# Patient Record
Sex: Female | Born: 1960 | Race: Black or African American | Hispanic: No | Marital: Single | Smoking: Former smoker
Health system: Southern US, Community
[De-identification: ages and names within clinical notes are randomized; demographics above are authoritative.]

## PROBLEM LIST (undated history)

## (undated) DIAGNOSIS — I10 Essential (primary) hypertension: Secondary | ICD-10-CM

## (undated) DIAGNOSIS — M1990 Unspecified osteoarthritis, unspecified site: Secondary | ICD-10-CM

## (undated) DIAGNOSIS — I214 Non-ST elevation (NSTEMI) myocardial infarction: Secondary | ICD-10-CM

## (undated) DIAGNOSIS — F32A Depression, unspecified: Secondary | ICD-10-CM

## (undated) DIAGNOSIS — I2510 Atherosclerotic heart disease of native coronary artery without angina pectoris: Secondary | ICD-10-CM

## (undated) DIAGNOSIS — F329 Major depressive disorder, single episode, unspecified: Secondary | ICD-10-CM

## (undated) HISTORY — PX: ANKLE SURGERY: SHX546

---

## 2004-11-27 ENCOUNTER — Emergency Department (HOSPITAL_COMMUNITY): Admission: EM | Admit: 2004-11-27 | Discharge: 2004-11-27 | Payer: Self-pay | Admitting: Emergency Medicine

## 2004-12-05 ENCOUNTER — Ambulatory Visit (HOSPITAL_BASED_OUTPATIENT_CLINIC_OR_DEPARTMENT_OTHER): Admission: RE | Admit: 2004-12-05 | Discharge: 2004-12-05 | Payer: Self-pay | Admitting: Orthopedic Surgery

## 2004-12-05 ENCOUNTER — Ambulatory Visit (HOSPITAL_COMMUNITY): Admission: RE | Admit: 2004-12-05 | Discharge: 2004-12-05 | Payer: Self-pay | Admitting: Orthopedic Surgery

## 2004-12-06 ENCOUNTER — Ambulatory Visit: Payer: Self-pay | Admitting: *Deleted

## 2004-12-06 ENCOUNTER — Ambulatory Visit: Payer: Self-pay | Admitting: Internal Medicine

## 2005-01-16 ENCOUNTER — Ambulatory Visit: Payer: Self-pay | Admitting: Internal Medicine

## 2005-02-24 ENCOUNTER — Ambulatory Visit: Payer: Self-pay | Admitting: Internal Medicine

## 2005-03-14 ENCOUNTER — Encounter (INDEPENDENT_AMBULATORY_CARE_PROVIDER_SITE_OTHER): Payer: Self-pay | Admitting: Family Medicine

## 2005-04-10 ENCOUNTER — Ambulatory Visit: Payer: Self-pay | Admitting: Internal Medicine

## 2006-08-29 ENCOUNTER — Ambulatory Visit: Payer: Self-pay | Admitting: Internal Medicine

## 2006-09-24 ENCOUNTER — Ambulatory Visit: Payer: Self-pay | Admitting: Internal Medicine

## 2007-03-12 ENCOUNTER — Ambulatory Visit: Payer: Self-pay | Admitting: Family Medicine

## 2007-05-01 ENCOUNTER — Encounter (INDEPENDENT_AMBULATORY_CARE_PROVIDER_SITE_OTHER): Payer: Self-pay | Admitting: *Deleted

## 2007-05-01 ENCOUNTER — Encounter (INDEPENDENT_AMBULATORY_CARE_PROVIDER_SITE_OTHER): Payer: Self-pay | Admitting: Family Medicine

## 2007-05-01 DIAGNOSIS — I10 Essential (primary) hypertension: Secondary | ICD-10-CM | POA: Insufficient documentation

## 2008-01-24 ENCOUNTER — Telehealth (INDEPENDENT_AMBULATORY_CARE_PROVIDER_SITE_OTHER): Payer: Self-pay | Admitting: Family Medicine

## 2008-01-28 ENCOUNTER — Telehealth (INDEPENDENT_AMBULATORY_CARE_PROVIDER_SITE_OTHER): Payer: Self-pay | Admitting: Family Medicine

## 2008-02-19 ENCOUNTER — Ambulatory Visit: Payer: Self-pay | Admitting: Family Medicine

## 2008-02-25 LAB — CONVERTED CEMR LAB
ALT: 16 units/L (ref 0–35)
Albumin: 3.6 g/dL (ref 3.5–5.2)
BUN: 16 mg/dL (ref 6–23)
CO2: 25 meq/L (ref 19–32)
Chloride: 106 meq/L (ref 96–112)
Eosinophils Absolute: 0.2 10*3/uL (ref 0.0–0.7)
Eosinophils Relative: 3 % (ref 0–5)
Glucose, Bld: 134 mg/dL — ABNORMAL HIGH (ref 70–99)
MCHC: 32.1 g/dL (ref 30.0–36.0)
MCV: 88.3 fL (ref 78.0–100.0)
Neutro Abs: 3.8 10*3/uL (ref 1.7–7.7)
Neutrophils Relative %: 58 % (ref 43–77)
Platelets: 304 10*3/uL (ref 150–400)
Potassium: 3.9 meq/L (ref 3.5–5.3)
RDW: 16.2 % — ABNORMAL HIGH (ref 11.5–15.5)
Sodium: 142 meq/L (ref 135–145)
TSH: 0.999 microintl units/mL (ref 0.350–4.50)
WBC: 6.6 10*3/uL (ref 4.0–10.5)

## 2008-02-26 ENCOUNTER — Ambulatory Visit: Payer: Self-pay | Admitting: Nurse Practitioner

## 2008-02-26 LAB — CONVERTED CEMR LAB: Blood Glucose, AC Bkfst: 100 mg/dL

## 2008-03-31 ENCOUNTER — Ambulatory Visit: Payer: Self-pay | Admitting: Family Medicine

## 2008-03-31 ENCOUNTER — Encounter (INDEPENDENT_AMBULATORY_CARE_PROVIDER_SITE_OTHER): Payer: Self-pay | Admitting: Internal Medicine

## 2008-04-07 ENCOUNTER — Ambulatory Visit: Payer: Self-pay | Admitting: Internal Medicine

## 2008-04-21 ENCOUNTER — Encounter (INDEPENDENT_AMBULATORY_CARE_PROVIDER_SITE_OTHER): Payer: Self-pay | Admitting: Internal Medicine

## 2008-04-21 ENCOUNTER — Ambulatory Visit: Payer: Self-pay | Admitting: Internal Medicine

## 2008-04-21 DIAGNOSIS — K029 Dental caries, unspecified: Secondary | ICD-10-CM | POA: Insufficient documentation

## 2008-04-21 LAB — CONVERTED CEMR LAB
ALT: 15 units/L (ref 0–35)
AST: 14 units/L (ref 0–37)
Alkaline Phosphatase: 48 units/L (ref 39–117)
BUN: 11 mg/dL (ref 6–23)
Basophils Relative: 0 % (ref 0–1)
CO2: 27 meq/L (ref 19–32)
Calcium: 8.6 mg/dL (ref 8.4–10.5)
Chlamydia, DNA Probe: NEGATIVE
Chloride: 108 meq/L (ref 96–112)
Creatinine, Ser: 0.98 mg/dL (ref 0.40–1.20)
Eosinophils Absolute: 0.2 10*3/uL (ref 0.0–0.7)
LDL Cholesterol: 111 mg/dL — ABNORMAL HIGH (ref 0–99)
Lymphs Abs: 2.5 10*3/uL (ref 0.7–4.0)
Monocytes Absolute: 0.6 10*3/uL (ref 0.1–1.0)
Neutro Abs: 2.9 10*3/uL (ref 1.7–7.7)
Neutrophils Relative %: 47 % (ref 43–77)
Platelets: 346 10*3/uL (ref 150–400)
RBC: 4.87 M/uL (ref 3.87–5.11)
Triglycerides: 82 mg/dL (ref <150)

## 2008-04-29 ENCOUNTER — Ambulatory Visit (HOSPITAL_COMMUNITY): Admission: RE | Admit: 2008-04-29 | Discharge: 2008-04-29 | Payer: Self-pay | Admitting: Family Medicine

## 2008-04-29 ENCOUNTER — Encounter (INDEPENDENT_AMBULATORY_CARE_PROVIDER_SITE_OTHER): Payer: Self-pay | Admitting: Family Medicine

## 2008-05-06 ENCOUNTER — Encounter (INDEPENDENT_AMBULATORY_CARE_PROVIDER_SITE_OTHER): Payer: Self-pay | Admitting: Internal Medicine

## 2008-08-27 ENCOUNTER — Telehealth (INDEPENDENT_AMBULATORY_CARE_PROVIDER_SITE_OTHER): Payer: Self-pay | Admitting: Internal Medicine

## 2008-09-25 ENCOUNTER — Ambulatory Visit: Payer: Self-pay | Admitting: Internal Medicine

## 2008-09-25 LAB — CONVERTED CEMR LAB: Blood Glucose, Fingerstick: 110

## 2009-02-12 ENCOUNTER — Ambulatory Visit: Payer: Self-pay | Admitting: Internal Medicine

## 2009-02-12 DIAGNOSIS — E119 Type 2 diabetes mellitus without complications: Secondary | ICD-10-CM

## 2009-02-12 DIAGNOSIS — M79609 Pain in unspecified limb: Secondary | ICD-10-CM

## 2009-02-12 LAB — CONVERTED CEMR LAB
Blood Glucose, Fingerstick: 158
CO2: 25 meq/L (ref 19–32)
Glucose, Bld: 107 mg/dL — ABNORMAL HIGH (ref 70–99)
Hgb A1c MFr Bld: 6.6 %

## 2009-05-19 ENCOUNTER — Ambulatory Visit: Payer: Self-pay | Admitting: Internal Medicine

## 2009-05-19 ENCOUNTER — Ambulatory Visit (HOSPITAL_COMMUNITY): Admission: RE | Admit: 2009-05-19 | Discharge: 2009-05-19 | Payer: Self-pay | Admitting: Internal Medicine

## 2009-05-19 DIAGNOSIS — R930 Abnormal findings on diagnostic imaging of skull and head, not elsewhere classified: Secondary | ICD-10-CM

## 2009-05-19 LAB — CONVERTED CEMR LAB
Blood Glucose, Fingerstick: 179
Hgb A1c MFr Bld: 6.3 %

## 2009-09-21 ENCOUNTER — Ambulatory Visit: Payer: Self-pay | Admitting: Internal Medicine

## 2009-09-21 DIAGNOSIS — F17200 Nicotine dependence, unspecified, uncomplicated: Secondary | ICD-10-CM | POA: Insufficient documentation

## 2009-09-21 DIAGNOSIS — Z8719 Personal history of other diseases of the digestive system: Secondary | ICD-10-CM | POA: Insufficient documentation

## 2009-09-21 LAB — CONVERTED CEMR LAB
Bilirubin Urine: NEGATIVE
Blood Glucose, Fingerstick: 163
Blood in Urine, dipstick: NEGATIVE
Chlamydia, DNA Probe: NEGATIVE
GC Probe Amp, Genital: NEGATIVE
Glucose, Urine, Semiquant: NEGATIVE
Protein, U semiquant: NEGATIVE
Specific Gravity, Urine: 1.015
WBC Urine, dipstick: NEGATIVE
Whiff Test: NEGATIVE

## 2009-09-23 ENCOUNTER — Telehealth (INDEPENDENT_AMBULATORY_CARE_PROVIDER_SITE_OTHER): Payer: Self-pay | Admitting: Internal Medicine

## 2009-09-29 ENCOUNTER — Ambulatory Visit (HOSPITAL_COMMUNITY): Admission: RE | Admit: 2009-09-29 | Discharge: 2009-09-29 | Payer: Self-pay | Admitting: Internal Medicine

## 2009-10-05 ENCOUNTER — Ambulatory Visit: Payer: Self-pay | Admitting: Internal Medicine

## 2009-10-05 DIAGNOSIS — K921 Melena: Secondary | ICD-10-CM | POA: Insufficient documentation

## 2009-10-05 LAB — CONVERTED CEMR LAB
OCCULT 1: POSITIVE
OCCULT 2: POSITIVE
OCCULT 3: NEGATIVE
Rapid HIV Screen: NEGATIVE

## 2009-10-16 ENCOUNTER — Encounter (INDEPENDENT_AMBULATORY_CARE_PROVIDER_SITE_OTHER): Payer: Self-pay | Admitting: Internal Medicine

## 2009-10-16 LAB — CONVERTED CEMR LAB
ALT: 11 units/L (ref 0–35)
AST: 12 units/L (ref 0–37)
Alkaline Phosphatase: 38 units/L — ABNORMAL LOW (ref 39–117)
Basophils Absolute: 0 10*3/uL (ref 0.0–0.1)
CO2: 26 meq/L (ref 19–32)
Calcium: 8.4 mg/dL (ref 8.4–10.5)
Chloride: 107 meq/L (ref 96–112)
Glucose, Bld: 94 mg/dL (ref 70–99)
HDL: 44 mg/dL (ref >39)
LDL Cholesterol: 83 mg/dL (ref 0–99)
Lymphocytes Relative: 45 % (ref 12–46)
Lymphs Abs: 2.5 10*3/uL (ref 0.7–4.0)
Microalb, Ur: 0.5 mg/dL (ref 0.00–1.89)
Monocytes Relative: 8 % (ref 3–12)
Neutro Abs: 2.5 10*3/uL (ref 1.7–7.7)
Neutrophils Relative %: 45 % (ref 43–77)
Platelets: 287 10*3/uL (ref 150–400)
RBC: 4.84 M/uL (ref 3.87–5.11)
Sodium: 142 meq/L (ref 135–145)
Total Bilirubin: 0.4 mg/dL (ref 0.3–1.2)
Total CHOL/HDL Ratio: 3.3
VLDL: 16 mg/dL (ref 0–40)

## 2009-11-03 ENCOUNTER — Ambulatory Visit: Payer: Self-pay | Admitting: Internal Medicine

## 2009-11-03 LAB — CONVERTED CEMR LAB
BUN: 19 mg/dL (ref 6–23)
Calcium: 8.8 mg/dL (ref 8.4–10.5)
Glucose, Bld: 237 mg/dL — ABNORMAL HIGH (ref 70–99)
Potassium: 3.9 meq/L (ref 3.5–5.3)

## 2009-11-25 ENCOUNTER — Encounter (INDEPENDENT_AMBULATORY_CARE_PROVIDER_SITE_OTHER): Payer: Self-pay | Admitting: Internal Medicine

## 2010-01-21 ENCOUNTER — Telehealth (INDEPENDENT_AMBULATORY_CARE_PROVIDER_SITE_OTHER): Payer: Self-pay | Admitting: Internal Medicine

## 2010-01-31 ENCOUNTER — Ambulatory Visit: Payer: Self-pay | Admitting: Internal Medicine

## 2010-01-31 DIAGNOSIS — L723 Sebaceous cyst: Secondary | ICD-10-CM

## 2010-01-31 LAB — CONVERTED CEMR LAB
Blood Glucose, Fingerstick: 191
Eosinophils Absolute: 0.2 10*3/uL (ref 0.0–0.7)
Eosinophils Relative: 3 % (ref 0–5)
Hgb A1c MFr Bld: 6.5 %
Lymphs Abs: 2.1 10*3/uL (ref 0.7–4.0)
Monocytes Absolute: 0.6 10*3/uL (ref 0.1–1.0)
Monocytes Relative: 10 % (ref 3–12)
Neutro Abs: 2.7 10*3/uL (ref 1.7–7.7)
Neutrophils Relative %: 49 % (ref 43–77)

## 2010-02-02 ENCOUNTER — Encounter (INDEPENDENT_AMBULATORY_CARE_PROVIDER_SITE_OTHER): Payer: Self-pay | Admitting: Internal Medicine

## 2010-02-10 ENCOUNTER — Ambulatory Visit: Payer: Self-pay | Admitting: Internal Medicine

## 2010-09-15 NOTE — Progress Notes (Signed)
   Phone Note Outgoing Call   Summary of Call: Rec'd refill request for Metformin, Tiazac and HCTZ. Meds last filled 11/30/2009. Patient sees PCP later this month. Refill x 1 month and f/u with PCP as scheduled. Initial call taken by: Tereso Newcomer PA-C,  January 21, 2010 3:59 PM  Follow-up for Phone Call        Pt scheduled for 02/03/10. Follow-up by: Vesta Mixer CMA,  January 24, 2010 8:48 AM    Prescriptions: METFORMIN HCL 500 MG TABS (METFORMIN HCL) 1 tab by mouth daily with largest meal.  #30 x 0   Entered and Authorized by:   Tereso Newcomer PA-C   Signed by:   Tereso Newcomer PA-C on 01/21/2010   Method used:   Faxed to ...       [facility] - Pharmac (retail)       [address] [place], [place]  [address]       Ph: [phone]       Fax: [phone]   RxID:   [id number] TIAZAC 360 MG  CP24 (DILTIAZEM HCL ER BEADS) 1 tab by mouth daily  #30 x 0   Entered and Authorized by:   Tereso Newcomer PA-C   Signed by:   Tereso Newcomer PA-C on 01/21/2010   Method used:   Faxed to ...       [facility] - Pharmac (retail)       [address] [place], [place]  [address]       Ph: [phone]       Fax: [phone]   RxID:   [id number] HYDROCHLOROTHIAZIDE 25 MG  TABS (HYDROCHLOROTHIAZIDE) 1 tab by mouth daily  #30 x 0   Entered and Authorized by:   Tereso Newcomer PA-C   Signed by:   Tereso Newcomer PA-C on 01/21/2010   Method used:   Faxed to ...       [facility] - Pharmac (retail)       [address] [place], [place]  [address]       Ph: [phone]       Fax: [phone]   RxID:   [id number]

## 2010-09-15 NOTE — Assessment & Plan Note (Signed)
Summary: CPP EXAM//GK   Vital Signs:  Patient profile:   50 year old female LMP:     07/05/2009 Weight:      192 pounds Temp:     98.3 degrees F Pulse rate:   71 / minute Pulse rhythm:   regular Resp:     18 per minute BP sitting:   130 / 88  (left arm) Cuff size:   large  Vitals Entered By: Vesta Mixer CMA (September 21, 2009 11:04 AM) Is Patient Diabetic? Yes Pain Assessment Patient in pain? no      CBG Result 163 LMP (date): 07/05/2009     Enter LMP: 07/05/2009 Last PAP Result NEGATIVE FOR INTRAEPITHELIAL LESIONS OR MALIGNANCY.   History of Present Illness: 50 yo female her for CPP:  1.  Hx of abnormal lung exam:  Pt. is a smoker x 20 years.  CXR in 05/2009 showed atelectasis of RLL vs fibrosis.  Pt. denies any hx of pneumonia that she is aware of.  Pt. does inhale chemicals with job cleaning.  Has never really tried to quit smoking--has only been without for 7 days.  2.  BRBPR:  passed a hard stool 2 weeks ago.  Had bright red blood on tissue and in toilet water, but not noted mixed in stool itself.  Has not recurred.   Has had similar occurence with hard stools in past.  Rare, however.    Habits & Providers  Alcohol-Tobacco-Diet     Alcohol drinks/day: <1     Tobacco Status: current     Cigarette Packs/Day: 1/3 ppd     Year Started: age 89  Exercise-Depression-Behavior     Drug Use: never  Allergies: No Known Drug Allergies  Past History:  Past Medical History: HEEL PAIN, RIGHT (ICD-729.5) RIGHT LOWER LUNG FIELD CRACKLES (ICD-793.1) FOOT PAIN, RIGHT (ICD-729.5) DIABETES MELLITUS, TYPE II (ICD-250.00) DENTAL CARIES (ICD-521.00) ENCOUNTER FOR LONG-TERM USE OF OTHER MEDICATIONS (ICD-V58.69) ROUTINE GYNECOLOGICAL EXAMINATION (ICD-V72.31) HEALTH MAINTENANCE EXAM (ICD-V70.0) HYPERTENSION (ICD-401.9)  Past Surgical History: Reviewed history from 02/19/2008 and no changes required. Denies surgical history  Family History: Mothe, 68:HTN,Heart  disease,fibromyalgia,DM,thyroid problems. Father, 3:  MS,DM Sister, 98:  DM No children  Social History: Packs/Day:  1/3 ppd Drug Use:  never  Review of Systems General:  Energy fine. Eyes:  Bifocals.  Last eye check 2 years ago.. ENT:  Denies decreased hearing. CV:  Denies chest pain or discomfort and palpitations. Resp:  Complains of shortness of breath; For years--only if really exerts herself--no change for years.[name] GI:  Complains of constipation; denies abdominal pain, bloody stools, dark tarry stools, and diarrhea; See HPI--BRBPR. GU:  Denies discharge, dysuria, and urinary frequency; no period for 2 months.  Not sexually active.  Some hot flashes and night sweats.  . MS:  Denies joint pain, joint redness, and joint swelling; Right shoulder with chronic discomfort, however--old injury.  Naproxen helps.. Derm:  Denies lesion(s) and rash. Neuro:  Denies numbness, tingling, and weakness. Psych:  Denies anxiety, depression, and suicidal thoughts/plans.  Physical Exam  General:  Well-developed,well-nourished,in no acute distress; alert,appropriate and cooperative throughout examination Head:  Normocephalic and atraumatic without obvious abnormalities. No apparent alopecia or balding. Eyes:  No corneal or conjunctival inflammation noted. EOMI. Perrla. Funduscopic exam benign, without hemorrhages, exudates or papilledema. Vision grossly normal. Ears:  External ear exam shows no significant lesions or deformities.  Otoscopic examination reveals clear canals, tympanic membranes are intact bilaterally without bulging, retraction, inflammation or discharge. Hearing is grossly normal  bilaterally. Nose:  External nasal examination shows no deformity or inflammation. Nasal mucosa are pink and moist without lesions or exudates. Mouth:  Oral mucosa and oropharynx without lesions or exudates.  fair dentition.   Neck:  No deformities, masses, or tenderness noted. Breasts:  No mass, nodules,  thickening, tenderness, bulging, retraction, inflamation, nipple discharge or skin changes noted.   Lungs:  Normal respiratory effort, chest expands symmetrically. Lungs are clear to auscultation, no crackles or wheezes. Heart:  Normal rate and regular rhythm. S1 and S2 normal without gallop, murmur, click, rub or other extra sounds. Abdomen:  Bowel sounds positive,abdomen soft and non-tender without masses, organomegaly or hernias noted. Rectal:  Small external tags noted. Normal sphincter tone. No rectal masses or tenderness. Genitalia:  Pelvic Exam:        External: normal female genitalia without lesions or masses        Vagina: normal without lesions or masses        Cervix: normal without lesions or masses        Adnexa: normal bimanual exam without masses or fullness        Uterus: normal by palpation        Pap smear: performed Msk:  No deformity or scoliosis noted of thoracic or lumbar spine.   Pulses:  R and L carotid,radial,femoral,dorsalis pedis and posterior tibial pulses are full and equal bilaterally Extremities:  No clubbing, cyanosis, edema, or deformity noted with normal full range of motion of all joints.   Neurologic:  No cranial nerve deficits noted. Station and gait are normal. Plantar reflexes are down-going bilaterally. DTRs are symmetrical throughout. Sensory, motor and coordinative functions appear intact. Skin:  hyperpigmented nonpalpable scar, right posterior shoulder Cervical Nodes:  No lymphadenopathy noted Axillary Nodes:  No palpable lymphadenopathy Inguinal Nodes:  No significant adenopathy Psych:  Cognition and judgment appear intact. Alert and cooperative with normal attention span and concentration. No apparent delusions, illusions, hallucinations   Impression & Recommendations:  Problem # 1:  ROUTINE GYNECOLOGICAL EXAMINATION (ICD-V72.31) Mammogram scheduled Orders: KOH/ WET Mount [phone]) Pap Smear, Thin Prep ( Collection of) (U0454) UA Dipstick w/o  Micro (automated)  (81003) T- GC Chlamydia (09811) T-Pap Smear, Thin Prep (91478)  Problem # 2:  TOBACCO ABUSE (ICD-305.1) Pt. to contemplate Chantix  Problem # 3:  RIGHT LOWER LUNG FIELD CRACKLES (ICD-793.1) No findings on exam today  Problem # 4:  RECTAL BLEEDING, HX OF (ICD-V12.79) Suspect secondary to hemorrhoids or possible superficial injury with passage of hard stools--to get stool card packet in.  Complete Medication List: 1)  Hydrochlorothiazide 25 Mg Tabs (Hydrochlorothiazide) .[name].. 1 tab by mouth daily 2)  Tiazac 360 Mg Cp24 (Diltiazem hcl er beads) .[name].. 1 tab by mouth daily 3)  Metformin Hcl 500 Mg Tabs (Metformin hcl) .[name].. 1 tab by mouth daily with largest meal. 4)  Naproxen 500 Mg Tabs (Naproxen) .[name].. 1 tab by mouth two times a day as needed heel pain. 5)  Blood Glucose Meter Kit (Blood glucose monitoring suppl) .... Check blood sugars daily 6)  Lancets Misc (Lancets) .... Test blood glucose daily. 7)  Glucose Monitor Test Strips  .... Check sugar once daily 8)  Centrum Chew (Multiple vitamins-minerals) .[name].. 1 tab by mouth daily 9)  Chantix Starting Month Pak 0.5 Mg X 11 & 1 Mg X 42 Tabs (Varenicline tartrate) .... 0.5 mg by mouth daily for 3 days, then two times a day until day 8, then increase to 1 mg by mouth two times  a day and stay on that dose 10)  Chantix 1 Mg Tabs (Varenicline tartrate) .[name].. 1 tab by mouth two times a day  Other Orders: Capillary Blood Glucose/CBG (01027)  Patient Instructions: 1)  Calcium 500 mg with Vitamin D 400 International Units 1 tab two times a day  2)  Schedule Retasure 3)  Astroglide for sexual lubricant 4)  Return in 2 weeks for fasting labs:  A1C, FLP, CMET, CBC, urine microalbumin, HIV, RPR--bring in stool cards with you 5)  Follow up with Dr. Delrae Alfred in 4 months --DM   Preventive Care Screening  Mammogram:    Date:  04/19/2008    Results:  normal   Prior Values:    Pap Smear:  NEGATIVE FOR INTRAEPITHELIAL LESIONS OR  MALIGNANCY. (04/21/2008)    Last Tetanus Booster:  Historical (09/14/2006)    Last Flu Shot:  Fluvax 3+ (05/19/2009)    Last Pneumovax:  Pneumovax (State) (04/21/2008)     SBE:  regular: no changes Osteoprevention:  Lactos intolerant.  No calcium or Vitamin D supplements. Guaiac Cards:  never Colonoscopy:  never.  Laboratory Results   Urine Tests    Routine Urinalysis   Glucose: negative   (Normal Range: Negative) Bilirubin: negative   (Normal Range: Negative) Ketone: negative   (Normal Range: Negative) Spec. Gravity: 1.015   (Normal Range: 1.003-1.035) Blood: negative   (Normal Range: Negative) pH: 6.0   (Normal Range: 5.0-8.0) Protein: negative   (Normal Range: Negative) Urobilinogen: 0.2   (Normal Range: 0-1) Nitrite: negative   (Normal Range: Negative) Leukocyte Esterace: negative   (Normal Range: Negative)     Blood Tests     CBG Random:: 163mg /dL    Wet Mount Source: vaginal WBC/hpf: 1-5 Bacteria/hpf: 1+ Clue cells/hpf: none  Negative whiff Yeast/hpf: none Wet Mount KOH: Negative Trichomonas/hpf: none   Laboratory Results   Urine Tests    Routine Urinalysis   Glucose: negative   (Normal Range: Negative) Bilirubin: negative   (Normal Range: Negative) Ketone: negative   (Normal Range: Negative) Spec. Gravity: 1.015   (Normal Range: 1.003-1.035) Blood: negative   (Normal Range: Negative) pH: 6.0   (Normal Range: 5.0-8.0) Protein: negative   (Normal Range: Negative) Urobilinogen: 0.2   (Normal Range: 0-1) Nitrite: negative   (Normal Range: Negative) Leukocyte Esterace: negative   (Normal Range: Negative)     Blood Tests     CBG Random:: 163    Wet Mount/KOH  Negative whiff     Tetanus/Td Immunization History:    Tetanus/Td # 1:  Historical (09/14/2006)  Influenza Immunization History:    Influenza # 1:  Fluvax 3+ (05/19/2009)  Pneumovax Immunization History:    Pneumovax # 1:  Pneumovax (State) (04/21/2008)

## 2010-09-15 NOTE — Letter (Signed)
Summary: *HSN Results Follow up  [facility]  [address] [place], [place] [address]   Phone: [phone]  Fax: [phone]      11/25/2009   Krista [name] [address] [place], [place]  [address]   Dear  [name],                            ____S.Drinkard,FNP   ____D. Gore,FNP       ____B. McPherson,MD   ____V. Rankins,MD    __X__E. Ladamien Rammel,MD    ____N. Daphine Deutscher, FNP  ____D. Reche Dixon, MD    ____K. Philipp Deputy, MD    ____Other     This letter is to inform you that your recent test(s):  _______Pap Smear    ____X___Lab Test     _______X-ray    ___X____ is within acceptable limits  _______ requires a medication change  _______ requires a follow-up lab visit  _______ requires a follow-up visit with your provider   Comments:  Your kidney function was better on repeat testing.  We will need to keep an eye on this.  It is very important that you keep your blood pressure and diabetes under control to protect your kidneys from damage.       _________________________________________________________ If you have any questions, please contact our office                     Sincerely,  Julieanne Manson MD [facility]

## 2010-09-15 NOTE — Progress Notes (Signed)
Medications Added CHANTIX STARTING MONTH PAK 0.5 MG X 11 & 1 MG X 42 TABS (VARENICLINE TARTRATE) 0.5 mg by mouth daily for 3 days, then two times a day until day 8, then increase to 1 mg by mouth two times a day and stay on that dose CHANTIX 1 MG TABS (VARENICLINE TARTRATE) 1 tab by mouth two times a day       Phone Note Call from Patient   Summary of Call: PT SAYS SHE IS OUT OF NAPROXEN AND A PILL THAT CAN HELP HER STOP SMOKING. CHANTIX.... PT PHARMACY IS [facility].... Initial call taken by: Leodis Rains,  September 23, 2009 4:07 PM  Follow-up for Phone Call        Did not realize this was not completed--will send in Rx for Chantix--please check with pharmacy to see if they have some on hand or if it is a 6-8 week wait. Notify pt. that she will regardless have to fill out paperwork regarding her income to get a continued supply of Chantix. She will need to go into the pharmacy to do this. Let her know to start on 0.5 mg tabs once daily for 3 days, then increase to two times a day dosing on day 4.  On day 8, she increases the dose to 1 mg by mouth two times a day and stays on that dose for 12 weeks total.  She will get a starter pack and an Rx for the 1 mg tabs. I would like to see her back in 3 months to see how she is doing --unless it takes 8 weeks to get the meds--then 12 weeks after she starts--have her call back and reschedule if that's the case. Let her know her pap, GC, Chlamydia, mammogram were all okay, but 2 of her 3 stool cards were positive for microscopic blood--make sure she wasn't having problems with hemorrhoids or a period (which I believe she stopped previously).  Am waiting for results of her bloodwork before referring onto GI for a colonoscopy, but that will likely be the next step. Follow-up by: Julieanne Manson MD,  October 05, 2009 5:32 PM  Additional Follow-up for Phone Call Additional follow up Details #1::        Chantix called in  and pt aware she will need  to do ICP paperwork. Additional Follow-up by: Vesta Mixer CMA,  October 06, 2009 10:40 AM    New/Updated Medications: CHANTIX STARTING MONTH PAK 0.5 MG X 11 & 1 MG X 42 TABS (VARENICLINE TARTRATE) 0.5 mg by mouth daily for 3 days, then two times a day until day 8, then increase to 1 mg by mouth two times a day and stay on that dose CHANTIX 1 MG TABS (VARENICLINE TARTRATE) 1 tab by mouth two times a day Prescriptions: CHANTIX 1 MG TABS (VARENICLINE TARTRATE) 1 tab by mouth two times a day  #60 x 2   Entered and Authorized by:   Julieanne Manson MD   Signed by:   Julieanne Manson MD on 10/05/2009   Method used:   Faxed to ...       [facility] - Pharmac (retail)       [address] [place], [place]  [address]       Ph: [phone]       Fax: [phone]   RxID:   [id number] CHANTIX STARTING MONTH PAK 0.5 MG X 11 & 1 MG X 42 TABS (VARENICLINE TARTRATE) 0.5  mg by mouth daily for 3 days, then two times a day until day 8, then increase to 1 mg by mouth two times a day and stay on that dose  #1 pack x 0   Entered and Authorized by:   Julieanne Manson MD   Signed by:   Julieanne Manson MD on 10/05/2009   Method used:   Faxed to ...       [facility] - Pharmac (retail)       [address] [place], [place]  [address]       Ph: [phone]       Fax: [phone]   RxID:   [id number] NAPROXEN 500 MG TABS (NAPROXEN) 1 tab by mouth two times a day as needed heel pain.  #60 x 1   Entered and Authorized by:   Julieanne Manson MD   Signed by:   Julieanne Manson MD on 09/23/2009   Method used:   Faxed to ...       [facility] - Pharmac (retail)       [address] [place], [place]  [address]       Ph: [phone]       Fax: [phone]   RxID:   [id number]

## 2010-09-15 NOTE — Letter (Signed)
Summary: *HSN Results Follow up  [facility]  [address] [place], [place] [address]   Phone: [phone]  Fax: [phone]      02/02/2010   Krista [name] [address] [place], [place]  [address]   Dear  Ms. [name],                            ____S.Drinkard,FNP   ____D. Gore,FNP       ____B. McPherson,MD   ____V. Rankins,MD    ___X_[name],MD    ____N. Daphine Deutscher, FNP  ____D. Reche Dixon, MD    ____K. Philipp Deputy, MD    ____Other     This letter is to inform you that your recent test(s):  _______Pap Smear    ____X___Lab Test     _______X-ray    ____X___ is within acceptable limits  _______ requires a medication change  _______ requires a follow-up lab visit  _______ requires a follow-up visit with your provider   Comments:  No anemia       _________________________________________________________ If you have any questions, please contact our office                     Sincerely,  Julieanne Manson MD [facility]

## 2010-09-15 NOTE — Letter (Signed)
Summary: Generic Letter  [facility]  [address] [place], [place] [address]   Phone: [phone]  Fax: [phone]    01/31/2010    Re:  JAQUAY MORNEAULT      [address]      [place], [place]  [address]  To Whom It May Concern:  Ms. Mccolgan is a patient of ours at [facility].  She carries the diagnoses of Diabetes mellitus, Hypertension, Hypercholesterolemia.  She is having difficulties affording food that helps her control these issues.  I ask that you keep this in mind as she applies for food stamps.        Sincerely,   Julieanne Manson MD

## 2010-09-15 NOTE — Letter (Signed)
Summary: [place] [facility]   Imported By: Arta Bruce 08/31/2010 12:29:14  _____________________________________________________________________  External Attachment:    Type:   Image     Comment:   External Document

## 2010-09-15 NOTE — Assessment & Plan Note (Signed)
Summary: FU WITH DR Macie Baum DM VISIT//GK   Vital Signs:  Patient profile:   50 year old female Weight:      196 pounds BMI:     37.17 Temp:     98.3 degrees F Pulse rate:   74 / minute Pulse rhythm:   regular Resp:     18 per minute BP sitting:   108 / 77  (left arm) Cuff size:   large  Vitals Entered By: Vesta Mixer CMA (January 31, 2010 10:39 AM) CC: 6 month f/u dm Is Patient Diabetic? Yes Pain Assessment Patient in pain? yes     Location: base of spine boil Intensity: 8 CBG Result 191  Does patient need assistance? Ambulation Normal   CC:  6 month f/u dm.  History of Present Illness: 1.  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  Discussed keeing bp and DM under good control important to protect kidneys.    2.  DM:  A1C at 6.5%.  Sugars generally in low 100s.  Just received a treadmill.  Plans to start exercising more regularly.  Feels like she eats healthy foods most of time.  Has not had an eye check in over 1 year.  Performs foot care every night.  3.  Rectal bleeding:  after a hard stool.  Still intermittently with constipation, but no more BRBPR since beginning of March.  Hemoccult cards returned with 2 cards positive.  Pt. states she noted blood still on tissue when performed the stool cards.    4.  Hypercholesterolemia:  LDL not quite at goal--to have recheck in September.    5.  Tobacco Abuse:  has not been able to apply for ICP of Chantix--needs to get financial papers into ICP coordinator with our office.   6.  Boil on spine between buttocks--tender.  Has had for 2 weeks.  Seems to be enlarging. No fevers.  Pt. later states had something similar in same area years ago--cut out, but feels like it has returned.  Allergies (verified): No Known Drug Allergies  Physical Exam  General:  NAD Lungs:  Normal respiratory effort, chest expands symmetrically. Lungs are clear to auscultation, no crackles or  wheezes. Heart:  Normal rate and regular rhythm. S1 and S2 normal without gallop, murmur, click, rub or other extra sounds.  Radial pulses normal and equal Skin:  2 cm cystic lesion --moveable under a well healed scar between upper buttocks.  Mild surrounding erythema.  No definitie fluctuance.  MIldly tender.   Impression & Recommendations:  Problem # 1:  RECTAL BLEEDING, HX OF (ICD-V12.79)  Another set of Guaiac cards as pt. no longer with active bleeding since Feb.   Suspect hemorrhoid or fissure.  Orders: T-CBC w/Diff ([address])  Problem # 2:  ELEVATED CREATININE (ICD-794.4) Resolved To work on hypertension and DM to avoid renal injury Orders: T-CBC w/Diff ([address]1478)  Problem # 3:  DIABETES MELLITUS, TYPE II (ICD-250.00) Controlled Her updated medication list for this problem includes:    Metformin Hcl 500 Mg Tabs (Metformin hcl) .[name]... 1 tab by mouth daily with largest meal.  Orders: Capillary Blood Glucose/CBG (29[address]) T-CBC w/Diff ([address]-57846) Hgb A1C (96295MW)  Problem # 4:  HYPERTENSION (ICD-401.9) Controlled Her updated medication list for this problem includes:    Hydrochlorothiazide 25 Mg Tabs (Hydrochlorothiazide) .[name]... 1 tab by mouth daily    Tiazac 360 Mg Cp24 (Diltiazem hcl er beads) .[name]... 1 tab by mouth daily  Orders: T-CBC w/Diff (  [address])  Problem # 5:  SEBACEOUS CYST, INFECTED (ICD-706.2) Doxycycline 100 mg two times a day for 7 days  Complete Medication List: 1)  Hydrochlorothiazide 25 Mg Tabs (Hydrochlorothiazide) .[name].. 1 tab by mouth daily 2)  Tiazac 360 Mg Cp24 (Diltiazem hcl er beads) .[name].. 1 tab by mouth daily 3)  Metformin Hcl 500 Mg Tabs (Metformin hcl) .[name].. 1 tab by mouth daily with largest meal. 4)  Naproxen 500 Mg Tabs (Naproxen) .[name].. 1 tab by mouth two times a day as needed heel pain. 5)  Blood Glucose Meter Kit (Blood glucose monitoring suppl) .... Check blood sugars daily 6)  Lancets Misc (Lancets) .... Test blood glucose  daily. 7)  Glucose Monitor Test Strips  .... Check sugar once daily 8)  Centrum Chew (Multiple vitamins-minerals) .[name].. 1 tab by mouth daily 9)  Chantix Starting Month Pak 0.5 Mg X 11 & 1 Mg X 42 Tabs (Varenicline tartrate) .... 0.5 mg by mouth daily for 3 days, then two times a day until day 8, then increase to 1 mg by mouth two times a day and stay on that dose 10)  Chantix 1 Mg Tabs (Varenicline tartrate) .[name].. 1 tab by mouth two times a day 11)  Doxycycline Hyclate 100 Mg Tabs (Doxycycline hyclate) .[name].. 1 tab by mouth two times a day for 7 days  Patient Instructions: 1)  Fasting lab visit in September :  FLP, liver. 2)  Schedule Retasure 3)  Call for a follow up appt. with Dr. Delrae Alfred in 6 weeks after starting Chantix. 4)  Call if boil does not go away--warm pack for 1/2 hour about 30 minutes after taking antibiotic twice daily Prescriptions: GLUCOSE MONITOR TEST STRIPS check sugar once daily  #30 x 11   Entered and Authorized by:   Julieanne Manson MD   Signed by:   Julieanne Manson MD on 01/31/2010   Method used:   Faxed to ...       [facility] - [facility] (retail)       [address] [place], [place]  [address]       Ph: [phone]       Fax: [phone]   RxID:   [id number] LANCETS  MISC (LANCETS) test blood glucose daily.  #30 x 11   Entered and Authorized by:   Julieanne Manson MD   Signed by:   Julieanne Manson MD on 01/31/2010   Method used:   Faxed to ...       [facility] - [facility] (retail)       [address] [place], [place]  [address]       Ph: [phone]       Fax: [phone]   RxID:   [id number] METFORMIN HCL 500 MG TABS (METFORMIN HCL) 1 tab by mouth daily with largest meal.  #30 x 11   Entered and Authorized by:   Julieanne Manson MD   Signed by:   Julieanne Manson MD on 01/31/2010   Method used:   Faxed to ...       [facility] - [facility]  (retail)       [address] [place], [place]  [address]       Ph: [phone]       Fax: [phone]   RxID:   [id number] TIAZAC 360 MG  CP24 (DILTIAZEM HCL ER BEADS) 1 tab  by mouth daily  #30 x 11   Entered and Authorized by:   Julieanne Manson MD   Signed by:   Julieanne Manson MD on 01/31/2010   Method used:   Faxed to ...       [facility] - [facility] (retail)       [address] [place], [place]  [address]       Ph: [phone]       Fax: [phone]   RxID:   [id number] HYDROCHLOROTHIAZIDE 25 MG  TABS (HYDROCHLOROTHIAZIDE) 1 tab by mouth daily  #30 x 11   Entered and Authorized by:   Julieanne Manson MD   Signed by:   Julieanne Manson MD on 01/31/2010   Method used:   Faxed to ...       [facility] - Pharmac (retail)       [address] [place], [place]  [address]       Ph: [phone]       Fax: [phone]   RxID:   [id number] DOXYCYCLINE HYCLATE 100 MG TABS (DOXYCYCLINE HYCLATE) 1 tab by mouth two times a day for 7 days  #14 x 0   Entered and Authorized by:   Julieanne Manson MD   Signed by:   Julieanne Manson MD on 01/31/2010   Method used:   Faxed to ...       [facility] - [facility] (retail)       [address] [place], [place]  [address]       Ph: [phone]       Fax: [phone]   RxID:   [id number]   Laboratory Results   Blood Tests     HGBA1C: 6.5%   (Normal Range: Non-Diabetic - 3-6%   Control Diabetic - 6-8%) CBG Random:: 191mg /dL

## 2010-09-15 NOTE — Letter (Signed)
Summary: Lipid Letter  [facility]  [address] [place], [place] [address]   Phone: [phone]  Fax: [phone]    10/16/2009  Krista [name] [address] [place], [place]  [address]  Dear Krista [name]:  We have carefully reviewed your last lipid profile from 10/05/2009 and the results are noted below with a summary of recommendations for lipid management.    Cholesterol:       143     Goal: <200   HDL "good" Cholesterol:   44     Goal: >45   LDL "bad" Cholesterol:   83     Goal: <70   Triglycerides:       78     Goal: <150    Your cholesterol is pretty close to goal, but want LDL less than 70 and HDL higher.  Please work on diet and exercise.  Need you to call in to schedule a repeat check of cholesterol in 6 months.  Tiffany will be calling to have you come in soon for a recheck of your blood for kidney function as your labs were a bit off for this.  Rest of labs okay    TLC Diet (Therapeutic Lifestyle Change): Saturated Fats & Transfatty acids should be kept < 7% of total calories ***Reduce Saturated Fats Polyunstaurated Fat can be up to 10% of total calories Monounsaturated Fat Fat can be up to 20% of total calories Total Fat should be no greater than 25-35% of total calories Carbohydrates should be 50-60% of total calories Protein should be approximately 15% of total calories Fiber should be at least 20-30 grams a day ***Increased fiber may help lower LDL Total Cholesterol should be < 200mg /day Consider adding plant stanol/sterols to diet (example: Benacol spread) ***A higher intake of unsaturated fat may reduce Triglycerides and Increase HDL    Adjunctive Measures (may lower LIPIDS and reduce risk of Heart Attack) include: Aerobic Exercise (20-30 minutes 3-4 times a week) Limit Alcohol Consumption Weight Reduction Aspirin 75-81 mg a day by mouth (if not allergic or contraindicated) Dietary Fiber 20-30 grams a day by mouth     Current Medications: 1)     Hydrochlorothiazide 25 Mg  Tabs (Hydrochlorothiazide) .[name].. 1 tab by mouth daily 2)    Tiazac 360 Mg  Cp24 (Diltiazem hcl er beads) .[name].. 1 tab by mouth daily 3)    Metformin Hcl 500 Mg Tabs (Metformin hcl) .[name].. 1 tab by mouth daily with largest meal. 4)    Naproxen 500 Mg Tabs (Naproxen) .[name].. 1 tab by mouth two times a day as needed heel pain. 5)    Blood Glucose Meter  Kit (Blood glucose monitoring suppl) .... Check blood sugars daily 6)    Lancets  Misc (Lancets) .... Test blood glucose daily. 7)    Glucose Monitor Test Strips  .... Check sugar once daily 8)    Centrum  Chew (Multiple vitamins-minerals) .[name].. 1 tab by mouth daily 9)    Chantix Starting Month Pak 0.5 Mg X 11 & 1 Mg X 42 Tabs (Varenicline tartrate) .... 0.5 mg by mouth daily for 3 days, then two times a day until day 8, then increase to 1 mg by mouth two times a day and stay on that dose 10)    Chantix 1 Mg Tabs (Varenicline tartrate) .[name].. 1 tab by mouth two times a day  If you have any questions, please call. We appreciate being able to work with you.   Sincerely,  [facility] Julieanne Manson MD

## 2010-09-15 NOTE — Progress Notes (Signed)
Summary: Office Visit//DEPRESSION SCREENING  Office Visit//DEPRESSION SCREENING   Imported By: Arta Bruce 11/10/2009 12:42:12  _____________________________________________________________________  External Attachment:    Type:   Image     Comment:   External Document

## 2010-10-21 ENCOUNTER — Encounter (INDEPENDENT_AMBULATORY_CARE_PROVIDER_SITE_OTHER): Payer: Self-pay | Admitting: Internal Medicine

## 2010-10-21 ENCOUNTER — Telehealth (INDEPENDENT_AMBULATORY_CARE_PROVIDER_SITE_OTHER): Payer: Self-pay | Admitting: Internal Medicine

## 2010-10-21 ENCOUNTER — Encounter: Payer: Self-pay | Admitting: Internal Medicine

## 2010-10-21 DIAGNOSIS — M67919 Unspecified disorder of synovium and tendon, unspecified shoulder: Secondary | ICD-10-CM | POA: Insufficient documentation

## 2010-10-21 DIAGNOSIS — J309 Allergic rhinitis, unspecified: Secondary | ICD-10-CM | POA: Insufficient documentation

## 2010-10-21 DIAGNOSIS — M719 Bursopathy, unspecified: Secondary | ICD-10-CM | POA: Insufficient documentation

## 2010-10-25 NOTE — Assessment & Plan Note (Signed)
Summary: Cold and synus symptoms   Vital Signs:  Patient profile:   50 year old female Weight:      190.50 pounds Temp:     98.0 degrees F oral Pulse rate:   73 / minute Pulse rhythm:   regular Resp:     18 per minute BP sitting:   110 / 71  (left arm) Cuff size:   regular  Vitals Entered By: Hale Drone CMA (October 21, 2010 12:04 PM) CC: Having right shoulder pain x1 month. Constantly achng and it will hurt even more when she moves it..... HA's x1 week and it's only when she goes to sleep.... Denies fever/ vomiting/nauseas and diarrhea.  Is Patient Diabetic? Yes Pain Assessment Patient in pain? yes      CBG Result 147 CBG Device ID non fasting  Does patient need assistance? Functional Status Self care Ambulation Normal   CC:  Having right shoulder pain x1 month. Constantly achng and it will hurt even more when she moves it..... HA's x1 week and it's only when she goes to sleep.... Denies fever/ vomiting/nauseas and diarrhea. .  History of Present Illness: 1.  Right shoulder pain:  Has been bothering her for 1 month.  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  Hurts to sleep on the shoulder.  Has tried ibuprofen 1200 mg without improvement--does help headaches, however.  Has tried Naproxen 1000 mg --did not help as well.  The worst pain is with a wiping motion or rotational movement.  2.  Headache:  around right eye.  Has had sneezing and congestion since the pollen came.  Also, with intermittent itchy, watery eyes and irritated throat.     Current Medications (verified): 1)  Hydrochlorothiazide 25 Mg  Tabs (Hydrochlorothiazide) .[name].. 1 Tab By Mouth Daily 2)  Tiazac 360 Mg  Cp24 (Diltiazem Hcl Er Beads) .[name].. 1 Tab By Mouth Daily 3)  Metformin Hcl 500 Mg Tabs (Metformin Hcl) .[name].. 1 Tab By Mouth Daily With Largest Meal. 4)  Naproxen 500 Mg Tabs (Naproxen) .[name].. 1 Tab By Mouth Two Times A Day As Needed Heel Pain. 5)  Blood  Glucose Meter  Kit (Blood Glucose Monitoring Suppl) .... Check Blood Sugars Daily 6)  Lancets  Misc (Lancets) .... Test Blood Glucose Daily. 7)  Glucose Monitor Test Strips .... Check Sugar Once Daily 8)  Centrum  Chew (Multiple Vitamins-Minerals) .[name].. 1 Tab By Mouth Daily 9)  Chantix Starting Month Pak 0.5 Mg X 11 & 1 Mg X 42 Tabs (Varenicline Tartrate) .... 0.5 Mg By Mouth Daily For 3 Days, Then Two Times A Day Until Day 8, Then Increase To 1 Mg By Mouth Two Times A Day and Stay On That Dose 10)  Chantix 1 Mg Tabs (Varenicline Tartrate) .[name].. 1 Tab By Mouth Two Times A Day 11)  Doxycycline Hyclate 100 Mg Tabs (Doxycycline Hyclate) .[name].. 1 Tab By Mouth Two Times A Day For 7 Days  Allergies (verified): No Known Drug Allergies  Physical Exam  General:  NAD Head:  Tender over right maxillary area Eyes:  conjunctivae without injection Ears:  TMs pearly gray Nose:  mucosa swollen and red with clear discharge and some crusting. Mouth:  pharynx pink and moist.   Neck:  No deformities, masses, or tenderness noted. Lungs:  Normal respiratory effort, chest expands symmetrically. Lungs are clear to auscultation, no crackles or wheezes. Heart:  Normal rate and regular rhythm. S1 and S2  normal without gallop, murmur, click, rub or other extra sounds. Extremities:  Full ROM of shoulder, but possibly some crepitation .  Tender over AC, CC and subacromial bursa.  NT over right trap. 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 1 tab by mouth daily    Fluticasone Propionate 50 Mcg/act Susp (Fluticasone propionate) .[name]... 2 sprays each nostril daily as needed allergies  Problem # 2:  ROTATOR CUFF SYNDROME, RIGHT (ICD-726.10)  Orders: Physical Therapy Referral (PT)  Complete  Medication List: 1)  Hydrochlorothiazide 25 Mg Tabs (Hydrochlorothiazide) .[name].. 1 tab by mouth daily 2)  Tiazac 360 Mg Cp24 (Diltiazem hcl er beads) .[name].. 1 tab by mouth daily 3)  Metformin Hcl 500 Mg Tabs (Metformin hcl) .[name].. 1 tab by mouth daily with largest meal. 4)  Blood Glucose Meter Kit (Blood glucose monitoring suppl) .... Check blood sugars daily 5)  Lancets Misc (Lancets) .... Test blood glucose daily. 6)  Glucose Monitor Test Strips  .... Check sugar once daily 7)  Centrum Chew (Multiple vitamins-minerals) .[name].. 1 tab by mouth daily 8)  Chantix Starting Month Pak 0.5 Mg X 11 & 1 Mg X 42 Tabs (Varenicline tartrate) .... 0.5 mg by mouth daily for 3 days, then two times a day until day 8, then increase to 1 mg by mouth two times a day and stay on that dose 9)  Chantix 1 Mg Tabs (Varenicline tartrate) .[name].. 1 tab by mouth two times a day 10)  Loratadine 10 Mg Tabs (Loratadine) .[name].. 1 tab by mouth daily 11)  Fluticasone Propionate 50 Mcg/act Susp (Fluticasone propionate) .... 2 sprays each nostril daily as needed allergies 12)  Diclofenac Sodium 75 Mg Tbec (Diclofenac sodium) .[name].. 1 tab by mouth two times a day with food for 2 weeks, then as needed  Patient Instructions: 1)  Rest right shoulder as much as possible 2)  Stop all antiinflammatory meds other than Diclofenac.  (that includes Ibuprofen, Naproxen, aspirin for now) 3)  Take diclofenac only as written. 4)  Follow up with Dr. Delrae Alfred in 3 months --shoulder Prescriptions: DICLOFENAC SODIUM 75 MG TBEC (DICLOFENAC SODIUM) 1 tab by mouth two times a day with food for 2 weeks, then as needed  #60 x 0   Entered and Authorized by:   Julieanne Manson MD   Signed by:   Julieanne Manson MD on 10/21/2010   Method used:   Electronically to        [facility] (retail)       [address]       [place], [place]  [address]       Ph: [phone]       Fax: [phone]   RxID:   [id number] FLUTICASONE  PROPIONATE 50 MCG/ACT SUSP (FLUTICASONE PROPIONATE) 2 sprays each nostril daily as needed allergies  #1 x 11   Entered and Authorized by:   Julieanne Manson MD   Signed by:   Julieanne Manson MD on 10/21/2010   Method used:   Faxed to ...       [facility] - [facility] (retail)       [address] [place], [place]  [address]  Ph: [phone]       Fax: [phone]   RxID:   [id number] LORATADINE 10 MG TABS (LORATADINE) 1 tab by mouth daily  #30 x 11   Entered and Authorized by:   Julieanne Manson MD   Signed by:   Julieanne Manson MD on 10/21/2010   Method used:   Faxed to ...       [facility] - [facility] (retail)       [address] [place], [place]  [address]       Ph: [phone]       Fax: [phone]   RxID:   [id number]    Orders Added: 1)  Est. Patient Level IV [[address]] 2)  Physical Therapy Referral [PT]

## 2010-10-25 NOTE — Progress Notes (Signed)
Summary: PT referral  Phone Note Outgoing Call   Summary of Call: Nora--physical therapy for right shoulder

## 2010-12-28 ENCOUNTER — Inpatient Hospital Stay (INDEPENDENT_AMBULATORY_CARE_PROVIDER_SITE_OTHER)
Admission: RE | Admit: 2010-12-28 | Discharge: 2010-12-28 | Disposition: A | Discharge status: Home / Self Care | Payer: Self-pay | Source: Ambulatory Visit | Attending: Family Medicine | Admitting: Family Medicine

## 2010-12-28 DIAGNOSIS — W57XXXA Bitten or stung by nonvenomous insect and other nonvenomous arthropods, initial encounter: Secondary | ICD-10-CM

## 2010-12-28 DIAGNOSIS — T148 Other injury of unspecified body region: Secondary | ICD-10-CM
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
## 2011-01-30 ENCOUNTER — Ambulatory Visit: Payer: Self-pay | Attending: Internal Medicine | Admitting: Physical Therapy

## 2011-01-30 DIAGNOSIS — M25619 Stiffness of unspecified shoulder, not elsewhere classified: Secondary | ICD-10-CM | POA: Insufficient documentation

## 2011-01-30 DIAGNOSIS — IMO0001 Reserved for inherently not codable concepts without codable children: Secondary | ICD-10-CM | POA: Insufficient documentation

## 2011-01-30 DIAGNOSIS — R293 Abnormal posture: Secondary | ICD-10-CM | POA: Insufficient documentation

## 2011-01-30 DIAGNOSIS — M25519 Pain in unspecified shoulder: Secondary | ICD-10-CM | POA: Insufficient documentation

## 2011-02-02 ENCOUNTER — Ambulatory Visit: Payer: Self-pay | Admitting: Rehabilitative and Restorative Service Providers"

## 2011-02-07 ENCOUNTER — Inpatient Hospital Stay (INDEPENDENT_AMBULATORY_CARE_PROVIDER_SITE_OTHER)
Admission: RE | Admit: 2011-02-07 | Discharge: 2011-02-07 | Disposition: A | Discharge status: Home / Self Care | Payer: Self-pay | Source: Ambulatory Visit | Attending: Emergency Medicine | Admitting: Emergency Medicine

## 2011-02-07 DIAGNOSIS — H6690 Otitis media, unspecified, unspecified ear: Secondary | ICD-10-CM

## 2011-02-14 ENCOUNTER — Ambulatory Visit: Payer: Self-pay | Attending: Internal Medicine | Admitting: Physical Therapy

## 2011-02-14 DIAGNOSIS — R293 Abnormal posture: Secondary | ICD-10-CM | POA: Insufficient documentation

## 2011-02-14 DIAGNOSIS — M25519 Pain in unspecified shoulder: Secondary | ICD-10-CM | POA: Insufficient documentation

## 2011-02-14 DIAGNOSIS — M25619 Stiffness of unspecified shoulder, not elsewhere classified: Secondary | ICD-10-CM | POA: Insufficient documentation

## 2011-02-14 DIAGNOSIS — IMO0001 Reserved for inherently not codable concepts without codable children: Secondary | ICD-10-CM | POA: Insufficient documentation

## 2011-02-16 ENCOUNTER — Ambulatory Visit: Payer: Self-pay | Admitting: Physical Therapy

## 2011-02-23 ENCOUNTER — Encounter: Payer: Self-pay | Admitting: Physical Therapy

## 2011-02-28 ENCOUNTER — Ambulatory Visit: Payer: Self-pay | Admitting: Physical Therapy

## 2011-03-02 ENCOUNTER — Ambulatory Visit: Payer: Self-pay | Admitting: Physical Therapy

## 2011-03-07 ENCOUNTER — Ambulatory Visit: Payer: Self-pay | Admitting: Physical Therapy

## 2011-03-09 ENCOUNTER — Ambulatory Visit: Payer: Self-pay | Admitting: Physical Therapy

## 2011-03-13 ENCOUNTER — Encounter: Payer: Self-pay | Admitting: Physical Therapy

## 2011-03-14 ENCOUNTER — Encounter: Payer: Self-pay | Admitting: Physical Therapy

## 2011-03-16 ENCOUNTER — Encounter: Payer: Self-pay | Admitting: Physical Therapy

## 2011-10-05 ENCOUNTER — Encounter (HOSPITAL_COMMUNITY): Payer: Self-pay | Admitting: Emergency Medicine

## 2011-10-05 ENCOUNTER — Emergency Department (INDEPENDENT_AMBULATORY_CARE_PROVIDER_SITE_OTHER)
Admission: EM | Admit: 2011-10-05 | Discharge: 2011-10-05 | Disposition: A | Discharge status: Home Health | Payer: Self-pay | Source: Home / Self Care | Attending: Emergency Medicine | Admitting: Emergency Medicine

## 2011-10-05 DIAGNOSIS — M25569 Pain in unspecified knee: Secondary | ICD-10-CM

## 2011-10-05 HISTORY — DX: Depression, unspecified: F32.A

## 2011-10-05 HISTORY — DX: Unspecified osteoarthritis, unspecified site: M19.90

## 2011-10-05 HISTORY — DX: Major depressive disorder, single episode, unspecified: F32.9

## 2011-10-05 HISTORY — DX: Essential (primary) hypertension: I10

## 2011-10-05 MED ORDER — DICLOFENAC SODIUM 75 MG PO TBEC: 75.0000 mg | DELAYED_RELEASE_TABLET | Freq: Two times a day (BID) | ORAL | Status: DC

## 2011-10-05 NOTE — ED Notes (Signed)
Pt is here with complaints of chronic bilat knee pain with increased intensity since Monday.  Pt has a history of bursitis and has been given cortisone injections in the past.  Currently taking Diclofenac.

## 2011-10-05 NOTE — Discharge Instructions (Signed)
Take the medication as written. Take 1 gram of tylenol  up to 4 times a day as needed for pain.. This with the dicolfenac is an effective combination for pain. Ice your knees 20 minutes at a time. Decrease your activity until the acute phase of the swelling is over.  Return if you get worse, have a fever >100.4, or for any concerns.

## 2011-10-05 NOTE — ED Provider Notes (Signed)
History     CSN: [id number]  Arrival date & time 10/05/11  4098   First MD Initiated Contact with Patient 10/05/11 [phone]      Chief Complaint  Patient presents with  . Knee Pain    (Consider location/radiation/quality/duration/timing/severity/associated sxs/prior treatment) HPI Comments: Patient with a history of knee pain for the past 10 years presents with acute flare of bilateral knee pain, swelling on the left more than right. 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 Has not been  on her knees recently. Pain is worse at the end of day, and with going up and downstairs. Reports crepitus. No redness, rash, discoloration, paresthesias distally. No sensation of giving way, and stability. No nausea, vomiting, fevers. Patient states this is identical to previous episodes of knee pain and states that NSAIDs and steroid injections usually work. Patient is requesting a refill of diclofenac and cortisone shot in her knees. No recent or remote history of injury to knees. No other recent change in physical activity.  Patient is a 51 y.o. female presenting with knee pain. The history is provided by the patient. No language interpreter was used.  Knee Pain This is a chronic problem. The current episode started more than 2 days ago. The problem occurs constantly. The problem has been gradually worsening. Pertinent negatives include no abdominal pain. The symptoms are aggravated by walking. The treatment provided moderate relief.    Past Medical History  Diagnosis Date  . Depression   . Hypertension   . Diabetes mellitus   . Arthritis     History reviewed. No pertinent past surgical history.  History reviewed. No pertinent family history.  History  Substance Use Topics  . Smoking status: Current Everyday Smoker  . Smokeless tobacco: Not on file  . Alcohol Use: No    OB History    Grav Para Term Preterm Abortions TAB SAB Ect Mult Living                   Review of Systems  Constitutional: Negative for fever.  Gastrointestinal: Negative for nausea, vomiting and abdominal pain.  Musculoskeletal: Positive for joint swelling and arthralgias.  Skin: Negative for color change and rash.  Neurological: Negative for weakness and numbness.    Allergies  Review of patient's allergies indicates no known allergies.  Home Medications   Current Outpatient Rx  Name Route Sig Dispense Refill  . DILTIAZEM HCL ER BEADS 360 MG PO CP24 Oral Take 360 mg by mouth daily.    [name] HYDROCHLOROTHIAZIDE 25 MG PO TABS Oral Take 25 mg by mouth daily.    [name] METFORMIN HCL 500 MG PO TABS Oral Take 500 mg by mouth daily with breakfast.    . DICLOFENAC SODIUM 75 MG PO TBEC Oral Take 1 tablet (75 mg total) by mouth 2 (two) times daily. Take with food 30 tablet 0    BP 126/82  Pulse 82  Temp(Src) 98 F (36.7 C) (Oral)  Resp 16  SpO2 96%  Physical Exam  Nursing note and vitals reviewed. Constitutional: She is oriented to person, place, and time. She appears well-developed and well-nourished. No distress.  HENT:  Head: Normocephalic and atraumatic.  Eyes: Conjunctivae and EOM are normal.  Neck: Normal range of motion.  Cardiovascular: Normal rate.   Pulmonary/Chest: Effort normal.  Abdominal: She exhibits no distension.  Musculoskeletal: Normal range of motion.       Trace effusion left side. No effusion right  side. No erythema bilaterally. Bilateral Knee ROM baseline for Pt,, Flexion/extension  intact , Patella NT, Patellar apprehension test negative, Patellar tendon NT, Medial joint NT , Lateral joint NT, Popliteal region NT, Lachman's stable, Varus stress testing stable, Valgus stress testing stable, McMurray's testing normal, distal NVI with intact baseline sensation / motor / pulse distal to knee bilaterally.  Neurological: She is alert and oriented to person, place, and time.  Skin: Skin is warm and dry.  Psychiatric: She has a normal mood and affect. Her  behavior is normal. Judgment and thought content normal.    ED Course  Procedures (including critical care time)  Labs Reviewed - No data to display No results found.   1. Recurrent knee pain     MDM  Previous charts reviewed. Additional medical history obtained. Patient has trace edema left knee, right knee completely normal. Bilateral mild crepitus. No evidence of instability, meniscal injury. Feel that this is exacerbation of chronic knee pain. Patient has had pain in her knees over 10 years. Will start patient on NSAIDs, bilateral knee sleeves. Will have her followup with orthopedics for further evaluation and steroid injections if necessary.  Luiz Blare, MD 10/05/11 [phone]

## 2012-01-17 ENCOUNTER — Other Ambulatory Visit: Payer: Self-pay | Admitting: Pulmonary Disease

## 2012-03-19 ENCOUNTER — Emergency Department (INDEPENDENT_AMBULATORY_CARE_PROVIDER_SITE_OTHER)
Admission: EM | Admit: 2012-03-19 | Discharge: 2012-03-19 | Disposition: A | Discharge status: Other Acute Inpatient Hospital | Payer: Self-pay | Source: Home / Self Care | Attending: Emergency Medicine | Admitting: Emergency Medicine

## 2012-03-19 ENCOUNTER — Encounter (HOSPITAL_COMMUNITY): Payer: Self-pay

## 2012-03-19 ENCOUNTER — Encounter (HOSPITAL_COMMUNITY): Payer: Self-pay | Admitting: Emergency Medicine

## 2012-03-19 ENCOUNTER — Observation Stay (HOSPITAL_COMMUNITY)
Admission: EM | Admit: 2012-03-19 | Discharge: 2012-03-20 | Disposition: A | Discharge status: Home / Self Care | Payer: Self-pay | Attending: Emergency Medicine | Admitting: Emergency Medicine

## 2012-03-19 ENCOUNTER — Emergency Department (HOSPITAL_COMMUNITY): Payer: Self-pay

## 2012-03-19 DIAGNOSIS — I10 Essential (primary) hypertension: Secondary | ICD-10-CM | POA: Insufficient documentation

## 2012-03-19 DIAGNOSIS — M7022 Olecranon bursitis, left elbow: Secondary | ICD-10-CM

## 2012-03-19 DIAGNOSIS — M7020 Olecranon bursitis, unspecified elbow: Secondary | ICD-10-CM

## 2012-03-19 DIAGNOSIS — F3289 Other specified depressive episodes: Secondary | ICD-10-CM | POA: Insufficient documentation

## 2012-03-19 DIAGNOSIS — R739 Hyperglycemia, unspecified: Secondary | ICD-10-CM

## 2012-03-19 DIAGNOSIS — F329 Major depressive disorder, single episode, unspecified: Secondary | ICD-10-CM | POA: Insufficient documentation

## 2012-03-19 DIAGNOSIS — E119 Type 2 diabetes mellitus without complications: Secondary | ICD-10-CM | POA: Insufficient documentation

## 2012-03-19 DIAGNOSIS — R7309 Other abnormal glucose: Secondary | ICD-10-CM

## 2012-03-19 LAB — GRAM STAIN

## 2012-03-19 LAB — CBC WITH DIFFERENTIAL/PLATELET
Basophils Relative: 0 % (ref 0–1)
Eosinophils Absolute: 0.1 10*3/uL (ref 0.0–0.7)
Eosinophils Relative: 1 % (ref 0–5)
HCT: 43.3 % (ref 36.0–46.0)
Hemoglobin: 14.8 g/dL (ref 12.0–15.0)
MCH: 28.6 pg (ref 26.0–34.0)
MCHC: 34.2 g/dL (ref 30.0–36.0)
MCV: 83.8 fL (ref 78.0–100.0)
Platelets: 222 10*3/uL (ref 150–400)
RDW: 14.1 % (ref 11.5–15.5)
WBC: 9.6 10*3/uL (ref 4.0–10.5)

## 2012-03-19 LAB — BASIC METABOLIC PANEL
BUN: 8 mg/dL (ref 6–23)
Potassium: 4.2 mEq/L (ref 3.5–5.1)
Sodium: 138 mEq/L (ref 135–145)

## 2012-03-19 MED ORDER — HYDROCODONE-ACETAMINOPHEN 5-325 MG PO TABS
2.0000 | ORAL_TABLET | Freq: Once | ORAL | Status: AC
  Administered 2012-03-19: 2 via ORAL

## 2012-03-19 MED ORDER — DILTIAZEM HCL ER BEADS 240 MG PO CP24: 360.0000 mg | ORAL_CAPSULE | Freq: Every day | ORAL | Status: DC

## 2012-03-19 MED ORDER — SODIUM CHLORIDE 0.9 % IV SOLN: 1000.0000 mL | Freq: Once | INTRAVENOUS | Status: DC

## 2012-03-19 MED ORDER — SODIUM CHLORIDE 0.9 % IV BOLUS (SEPSIS)
2000.0000 mL | Freq: Once | INTRAVENOUS | Status: AC
  Administered 2012-03-19: 2000 mL via INTRAVENOUS

## 2012-03-19 MED ORDER — CLINDAMYCIN PHOSPHATE 600 MG/50ML IV SOLN
600.0000 mg | Freq: Once | INTRAVENOUS | Status: AC
  Administered 2012-03-19: 600 mg via INTRAVENOUS
  Filled 2012-03-19: qty 50

## 2012-03-19 MED ORDER — ONDANSETRON HCL 4 MG/2ML IJ SOLN: 4.0000 mg | Freq: Three times a day (TID) | INTRAMUSCULAR | Status: DC | PRN

## 2012-03-19 MED ORDER — SODIUM CHLORIDE 0.9 % IV SOLN
INTRAVENOUS | Status: DC
  Administered 2012-03-20: 07:00:00 via INTRAVENOUS

## 2012-03-19 MED ORDER — HYDROCHLOROTHIAZIDE 25 MG PO TABS: 25.0000 mg | ORAL_TABLET | Freq: Every day | ORAL | Status: DC

## 2012-03-19 MED ORDER — DILTIAZEM HCL ER COATED BEADS 360 MG PO CP24
360.0000 mg | ORAL_CAPSULE | Freq: Once | ORAL | Status: AC
  Administered 2012-03-20: 360 mg via ORAL
  Filled 2012-03-19: qty 1

## 2012-03-19 MED ORDER — ACETAMINOPHEN 325 MG PO TABS
650.0000 mg | ORAL_TABLET | ORAL | Status: DC | PRN
  Administered 2012-03-19: 650 mg via ORAL
  Filled 2012-03-19: qty 2

## 2012-03-19 MED ORDER — METFORMIN HCL 500 MG PO TABS: 500.0000 mg | ORAL_TABLET | Freq: Every day | ORAL | Status: DC

## 2012-03-19 MED ORDER — SODIUM CHLORIDE 0.9 % IV SOLN: 1000.0000 mL | INTRAVENOUS | Status: DC

## 2012-03-19 MED ORDER — HYDROCODONE-ACETAMINOPHEN 5-325 MG PO TABS
ORAL_TABLET | ORAL | Status: AC
  Filled 2012-03-19: qty 2

## 2012-03-19 MED ORDER — ZOLPIDEM TARTRATE 5 MG PO TABS
5.0000 mg | ORAL_TABLET | Freq: Every evening | ORAL | Status: DC | PRN
  Administered 2012-03-19: 5 mg via ORAL
  Filled 2012-03-19: qty 1

## 2012-03-19 MED ORDER — CLINDAMYCIN PHOSPHATE 600 MG/50ML IV SOLN
600.0000 mg | Freq: Four times a day (QID) | INTRAVENOUS | Status: DC
  Administered 2012-03-20: 600 mg via INTRAVENOUS
  Filled 2012-03-19: qty 50

## 2012-03-19 NOTE — ED Notes (Signed)
Pt sent from [facility] with left elbow pain and swelling; purulent discharge drawn off elbow; pt here for further eval of possible septic joint; pt sts started last night

## 2012-03-19 NOTE — ED Notes (Signed)
Non-traumatic swelling left elbow since yesterday, pain worse since this AM; hot to touch

## 2012-03-19 NOTE — ED Notes (Signed)
PT has returned to waiting room

## 2012-03-19 NOTE — ED Notes (Signed)
Called pt for room, did not respond 

## 2012-03-19 NOTE — ED Provider Notes (Signed)
History     CSN: [id number]  Arrival date & time 03/19/12  1438   First MD Initiated Contact with Patient 03/19/12 1454      Chief Complaint  Patient presents with  . Joint Swelling    (Consider location/radiation/quality/duration/timing/severity/associated sxs/prior treatment) HPI Comments: Patient is a diabetic who reports atraumatic swelling, tenderness of her left elbow starting this morning. No nausea, vomiting, fevers. No change in physical activity. She has no history of gout. She states that she is compliant with all of her diabetic and hypertensive medications. Symptoms are worse with palpation. She is able to flex and extend her elbow. No alleviating factors. She has not tried anything for this. She usually takes diclofenac for her arthritis, which is primarily located in her knees but has not taken any diclofenac today. She has not taken any of her medications today.  ROS as noted in HPI. All other ROS negative.    Patient is a 51 y.o. female presenting with arm injury. The history is provided by the patient. No language interpreter was used.  Arm Injury  The injury mechanism is unknown. The context of the injury is unknown. There is an injury to the left elbow. Pertinent negatives include no nausea, no vomiting, no tingling and no weakness. There have been no prior injuries to these areas. She is right-handed.    Past Medical History  Diagnosis Date  . Depression   . Hypertension   . Diabetes mellitus   . Arthritis     History reviewed. No pertinent past surgical history.  History reviewed. No pertinent family history.  History  Substance Use Topics  . Smoking status: Current Everyday Smoker  . Smokeless tobacco: Not on file  . Alcohol Use: No    OB History    Grav Para Term Preterm Abortions TAB SAB Ect Mult Living                  Review of Systems  Gastrointestinal: Negative for nausea and vomiting.  Neurological: Negative for tingling and weakness.      Allergies  Review of patient's allergies indicates no known allergies.  Home Medications   Current Outpatient Rx  Name Route Sig Dispense Refill  . DICLOFENAC SODIUM 75 MG PO TBEC Oral Take 1 tablet (75 mg total) by mouth 2 (two) times daily. Take with food 30 tablet 0  . DILTIAZEM HCL ER BEADS 360 MG PO CP24 Oral Take 360 mg by mouth daily.    [name] HYDROCHLOROTHIAZIDE 25 MG PO TABS Oral Take 25 mg by mouth daily.    [name] METFORMIN HCL 500 MG PO TABS Oral Take 500 mg by mouth daily with breakfast.      BP 169/107  Pulse 82  Temp 99.8 F (37.7 C) (Oral)  Resp 20  SpO2 100%  Physical Exam  Nursing note and vitals reviewed. Constitutional: She is oriented to person, place, and time. She appears well-developed and well-nourished. No distress.  HENT:  Head: Normocephalic and atraumatic.  Eyes: Conjunctivae and EOM are normal.  Neck: Normal range of motion.  Cardiovascular: Normal rate.   Pulmonary/Chest: Effort normal.  Abdominal: She exhibits no distension.  Musculoskeletal: Normal range of motion.       Left upper arm: Normal.       Left forearm: She exhibits tenderness and swelling. She exhibits no bony tenderness.       Left hand: Normal.       Large, tender swelling over left olecranon bursa. Patient is  able to flex/extend left arm fully. No erythema. Skin appears intact. Motor, sensation intact in median/radial/R. distribution distally.   Neurological: She is alert and oriented to person, place, and time. Coordination normal.  Skin: Skin is warm and dry.  Psychiatric: She has a normal mood and affect. Her behavior is normal. Judgment and thought content normal.    ED Course  Apiration of blood/fluid Date/Time: 03/19/2012 5:17 PM Performed by: Luiz Blare Authorized by: Luiz Blare Consent: Verbal consent obtained. Risks and benefits: risks, benefits and alternatives were discussed Consent given by: patient Patient understanding: patient states  understanding of the procedure being performed Patient consent: the patient's understanding of the procedure matches consent given Procedure consent: procedure consent matches procedure scheduled Test results: test results available and properly labeled Required items: required blood products, implants, devices, and special equipment available Patient identity confirmed: verbally with patient and arm band Time out: Immediately prior to procedure a "time out" was called to verify the correct patient, procedure, equipment, support staff and site/side marked as required. Preparation: Patient was prepped and draped in the usual sterile fashion. Local anesthesia used: no Patient sedated: no Patient tolerance: Patient tolerated the procedure well with no immediate complications. Comments: Aspirated 3 mL of cloudy, yellowish fluid mixed with blood.    (including critical care time)  Labs Reviewed  GLUCOSE, CAPILLARY - Abnormal; Notable for the following:    Glucose-Capillary 310 (*)     All other components within normal limits   No results found.   1. Septic olecranon bursitis   2. Hyperglycemia     MDM   Diabetic patient with atraumatic left olecranon pain, swelling starting this morning. No history of gout. No fevers. States that she is compliant with her medications, but glucose is 310- here. Patient appears to be significantly uncomfortable, aspirated 3 mL of cloudy, purulent fluid mixed with blood from olecranon bursa. Concern for septic joint given patient's risk factors. sent fluid off for further analysis, including cell count, Gram stain, culture, crystals, LDH, lactate. Gave 2 Norco here. Transferring the ED to rule out septic joint vs septic bursitis.   Luiz Blare, MD 03/19/12 1725

## 2012-03-19 NOTE — ED Notes (Signed)
The pt advised of the wait time 

## 2012-03-20 LAB — BASIC METABOLIC PANEL
Calcium: 8.4 mg/dL (ref 8.4–10.5)
Creatinine, Ser: 0.67 mg/dL (ref 0.50–1.10)
GFR calc non Af Amer: >90 mL/min (ref >90)
Glucose, Bld: 299 mg/dL — ABNORMAL HIGH (ref 70–99)
Sodium: 138 mEq/L (ref 135–145)

## 2012-03-20 LAB — LACTATE DEHYDROGENASE, PLEURAL OR PERITONEAL FLUID: LD, Fluid: 1172 U/L — ABNORMAL HIGH (ref 3–23)

## 2012-03-20 MED ORDER — CLINDAMYCIN HCL 150 MG PO CAPS: 150.0000 mg | ORAL_CAPSULE | Freq: Four times a day (QID) | ORAL | Status: AC

## 2012-03-20 MED ORDER — HYDROCODONE-ACETAMINOPHEN 5-325 MG PO TABS: 2.0000 | ORAL_TABLET | ORAL | Status: AC | PRN

## 2012-03-20 NOTE — ED Provider Notes (Signed)
History     CSN: [id number]  Arrival date & time 03/19/12  1741   First MD Initiated Contact with Patient 03/19/12 2216      Chief Complaint  Patient presents with  . Joint Swelling    (Consider location/radiation/quality/duration/timing/severity/associated sxs/prior treatment) HPI Comments: Krista [name] is a 51 y.o. Female who has had left elbow swelling for 2 months, worsening, with pain and redness. Yesterday. No associated fever, chills, nausea, vomiting, weakness, dizziness, back pain chest pain, or shortness of breath. The left elbow swelling is more painful, when she touches it. She is able to move it without pain. No prior similar problem.  The history is provided by the patient.    Past Medical History  Diagnosis Date  . Depression   . Hypertension   . Diabetes mellitus   . Arthritis     History reviewed. No pertinent past surgical history.  History reviewed. No pertinent family history.  History  Substance Use Topics  . Smoking status: Current Everyday Smoker  . Smokeless tobacco: Not on file  . Alcohol Use: No    OB History    Grav Para Term Preterm Abortions TAB SAB Ect Mult Living                  Review of Systems  All other systems reviewed and are negative.    Allergies  Review of patient's allergies indicates no known allergies.  Home Medications   Current Outpatient Rx  Name Route Sig Dispense Refill  . VITAMIN D PO Oral Take 1 tablet by mouth daily.    [name] DICLOFENAC SODIUM 75 MG PO TBEC Oral Take 1 tablet (75 mg total) by mouth 2 (two) times daily. Take with food 30 tablet 0  . DILTIAZEM HCL ER BEADS 360 MG PO CP24 Oral Take 360 mg by mouth daily.    [name] HYDROCHLOROTHIAZIDE 25 MG PO TABS Oral Take 25 mg by mouth daily.    [name] LORATADINE 10 MG PO TABS Oral Take 10 mg by mouth daily.    [name] METFORMIN HCL 500 MG PO TABS Oral Take 500 mg by mouth daily with breakfast.      BP 152/101  Pulse 82  Temp 98.8 F (37.1 C) (Oral)  Resp 16   SpO2 95%  Physical Exam  Nursing note and vitals reviewed. Constitutional: She is oriented to person, place, and time. She appears well-developed and well-nourished.  HENT:  Head: Normocephalic and atraumatic.  Eyes: Conjunctivae and EOM are normal. Pupils are equal, round, and reactive to light.  Neck: Normal range of motion and phonation normal. Neck supple.  Cardiovascular: Normal rate, regular rhythm and intact distal pulses.   Pulmonary/Chest: Effort normal and breath sounds normal. She exhibits no tenderness.  Abdominal: Soft. She exhibits no distension. There is no tenderness. There is no guarding.  Musculoskeletal: Normal range of motion.       Left elbow, with posterior swelling, consistent with olecranon bursitis. Small point of puncture, and drainage, consistent with the recent aspiration. Neurovascular intact distally left hand.  Neurological: She is alert and oriented to person, place, and time. She has normal strength. She exhibits normal muscle tone.  Skin: Skin is warm and dry.  Psychiatric: She has a normal mood and affect. Her behavior is normal. Judgment and thought content normal.    ED Course  Procedures (including critical care time)  Emergency department treatment: IV fluids. IV clindamycin.  Transferred to CDU for management on hyperglycemia protocol.  Labs  Reviewed  CBC WITH DIFFERENTIAL - Abnormal; Notable for the following:    RBC 5.17 (*)     All other components within normal limits  BASIC METABOLIC PANEL - Abnormal; Notable for the following:    Glucose, Bld 243 (*)     All other components within normal limits  GRAM STAIN  LACTATE DEHYDROGENASE, BODY FLUID  BODY FLUID CULTURE  BASIC METABOLIC PANEL  BASIC METABOLIC PANEL  BASIC METABOLIC PANEL  BASIC METABOLIC PANEL  BASIC METABOLIC PANEL   Dg Elbow Complete Left  03/19/2012  *RADIOLOGY REPORT*  Clinical Data: Swelling, pain, diabetes  LEFT ELBOW - COMPLETE 3+ VIEW  Comparison: None.  Findings:  Normal alignment without fracture.  On the lateral view, there is distention of the posterior fat pad suggesting elbow effusion.  Soft tissue swelling focally the olecrenon region. Appearance compatible with olecrenon bursitis.  IMPRESSION: No acute osseous finding.  Focal olecrenon soft tissue swelling with a left elbow effusion compatible with olecranon bursitis.  Original Report Authenticated By: Judie Petit. Ruel Favors, M.D.     1. Olecranon bursitis   2. Hyperglycemia       MDM  Olecranon swelling, with purulent drainage, possibly infected. Doubt septic arthritis, cellulitis, lymphangitis. Hyperglycemia present without ketosis.   Plan: IV, clindamycin every 6 hours, observation in CDU for treatment of hyperglycemia. She is a candidate for discharge home if pain and swelling improve. In 6-12 hours. She could go home on Clindamycin or Keflex and Septra.        Flint Melter, MD 03/20/12 [phone]

## 2012-03-20 NOTE — ED Notes (Signed)
Complaining of pain in left elbow. Rates pain as 6/10. Constant throbbing pain. States she had blood drawn 4-5 months ago and elbow has been puffy since. No pain in elbow at that time. Woke up today with pain 10/10. Swelling noted

## 2012-03-20 NOTE — ED Notes (Signed)
ADA DT ordered per RN Vickey Huger

## 2012-03-23 LAB — BODY FLUID CULTURE

## 2012-08-02 ENCOUNTER — Encounter (HOSPITAL_COMMUNITY): Payer: Self-pay | Admitting: Emergency Medicine

## 2012-08-02 ENCOUNTER — Observation Stay (HOSPITAL_COMMUNITY)
Admission: EM | Admit: 2012-08-02 | Discharge: 2012-08-03 | Disposition: A | Discharge status: Home / Self Care | Payer: Self-pay | Attending: Internal Medicine | Admitting: Internal Medicine

## 2012-08-02 DIAGNOSIS — M67919 Unspecified disorder of synovium and tendon, unspecified shoulder: Secondary | ICD-10-CM

## 2012-08-02 DIAGNOSIS — K029 Dental caries, unspecified: Secondary | ICD-10-CM

## 2012-08-02 DIAGNOSIS — K529 Noninfective gastroenteritis and colitis, unspecified: Secondary | ICD-10-CM

## 2012-08-02 DIAGNOSIS — R1110 Vomiting, unspecified: Secondary | ICD-10-CM | POA: Insufficient documentation

## 2012-08-02 DIAGNOSIS — N289 Disorder of kidney and ureter, unspecified: Secondary | ICD-10-CM

## 2012-08-02 DIAGNOSIS — N179 Acute kidney failure, unspecified: Secondary | ICD-10-CM

## 2012-08-02 DIAGNOSIS — R930 Abnormal findings on diagnostic imaging of skull and head, not elsewhere classified: Secondary | ICD-10-CM

## 2012-08-02 DIAGNOSIS — E860 Dehydration: Secondary | ICD-10-CM

## 2012-08-02 DIAGNOSIS — E876 Hypokalemia: Secondary | ICD-10-CM

## 2012-08-02 DIAGNOSIS — J309 Allergic rhinitis, unspecified: Secondary | ICD-10-CM

## 2012-08-02 DIAGNOSIS — Z720 Tobacco use: Secondary | ICD-10-CM

## 2012-08-02 DIAGNOSIS — R197 Diarrhea, unspecified: Secondary | ICD-10-CM

## 2012-08-02 DIAGNOSIS — M79609 Pain in unspecified limb: Secondary | ICD-10-CM

## 2012-08-02 DIAGNOSIS — E119 Type 2 diabetes mellitus without complications: Secondary | ICD-10-CM | POA: Diagnosis present

## 2012-08-02 DIAGNOSIS — K5289 Other specified noninfective gastroenteritis and colitis: Secondary | ICD-10-CM

## 2012-08-02 DIAGNOSIS — M1990 Unspecified osteoarthritis, unspecified site: Secondary | ICD-10-CM | POA: Insufficient documentation

## 2012-08-02 DIAGNOSIS — A088 Other specified intestinal infections: Principal | ICD-10-CM | POA: Insufficient documentation

## 2012-08-02 DIAGNOSIS — Z8719 Personal history of other diseases of the digestive system: Secondary | ICD-10-CM

## 2012-08-02 DIAGNOSIS — I10 Essential (primary) hypertension: Secondary | ICD-10-CM

## 2012-08-02 DIAGNOSIS — L723 Sebaceous cyst: Secondary | ICD-10-CM

## 2012-08-02 DIAGNOSIS — Z79899 Other long term (current) drug therapy: Secondary | ICD-10-CM | POA: Insufficient documentation

## 2012-08-02 DIAGNOSIS — K921 Melena: Secondary | ICD-10-CM

## 2012-08-02 DIAGNOSIS — F17200 Nicotine dependence, unspecified, uncomplicated: Secondary | ICD-10-CM

## 2012-08-02 LAB — CBC WITH DIFFERENTIAL/PLATELET
Basophils Absolute: 0 10*3/uL (ref 0.0–0.1)
Basophils Relative: 0 % (ref 0–1)
Eosinophils Absolute: 0.1 10*3/uL (ref 0.0–0.7)
Hemoglobin: 15.1 g/dL — ABNORMAL HIGH (ref 12.0–15.0)
MCH: 29.3 pg (ref 26.0–34.0)
MCHC: 34.2 g/dL (ref 30.0–36.0)
Monocytes Relative: 11 % (ref 3–12)
Neutro Abs: 2.1 10*3/uL (ref 1.7–7.7)
Neutrophils Relative %: 48 % (ref 43–77)
Platelets: 246 10*3/uL (ref 150–400)

## 2012-08-02 LAB — POCT I-STAT, CHEM 8
Creatinine, Ser: 1.2 mg/dL — ABNORMAL HIGH (ref 0.50–1.10)
Glucose, Bld: 134 mg/dL — ABNORMAL HIGH (ref 70–99)
HCT: 46 % (ref 36.0–46.0)
Hemoglobin: 15.6 g/dL — ABNORMAL HIGH (ref 12.0–15.0)
Potassium: 3.4 mEq/L — ABNORMAL LOW (ref 3.5–5.1)
Sodium: 143 mEq/L (ref 135–145)
TCO2: 29 mmol/L (ref 0–100)

## 2012-08-02 LAB — LIPASE, BLOOD: Lipase: 24 U/L (ref 11–59)

## 2012-08-02 LAB — MAGNESIUM: Magnesium: 2.2 mg/dL (ref 1.5–2.5)

## 2012-08-02 LAB — GLUCOSE, CAPILLARY: Glucose-Capillary: 128 mg/dL — ABNORMAL HIGH (ref 70–99)

## 2012-08-02 MED ORDER — FAMOTIDINE IN NACL 20-0.9 MG/50ML-% IV SOLN
20.0000 mg | Freq: Two times a day (BID) | INTRAVENOUS | Status: DC
  Administered 2012-08-02 – 2012-08-03 (×2): 20 mg via INTRAVENOUS
  Filled 2012-08-02 (×4): qty 50

## 2012-08-02 MED ORDER — SODIUM CHLORIDE 0.9 % IV SOLN: 1000.0000 mL | INTRAVENOUS | Status: DC

## 2012-08-02 MED ORDER — SODIUM CHLORIDE 0.9 % IV SOLN
1000.0000 mL | Freq: Once | INTRAVENOUS | Status: AC
  Administered 2012-08-02: 1000 mL via INTRAVENOUS

## 2012-08-02 MED ORDER — INSULIN ASPART 100 UNIT/ML ~~LOC~~ SOLN
0.0000 [IU] | SUBCUTANEOUS | Status: DC
  Administered 2012-08-02: 1 [IU] via SUBCUTANEOUS

## 2012-08-02 MED ORDER — SODIUM CHLORIDE 0.9 % IV SOLN: 250.0000 mL | INTRAVENOUS | Status: DC | PRN

## 2012-08-02 MED ORDER — ACETAMINOPHEN 325 MG PO TABS: 650.0000 mg | ORAL_TABLET | Freq: Four times a day (QID) | ORAL | Status: DC | PRN

## 2012-08-02 MED ORDER — SODIUM CHLORIDE 0.9 % IJ SOLN: 3.0000 mL | INTRAMUSCULAR | Status: DC | PRN

## 2012-08-02 MED ORDER — SODIUM CHLORIDE 0.9 % IV SOLN: INTRAVENOUS | Status: DC

## 2012-08-02 MED ORDER — ONDANSETRON HCL 4 MG/2ML IJ SOLN
4.0000 mg | Freq: Once | INTRAMUSCULAR | Status: AC
  Administered 2012-08-02: 4 mg via INTRAVENOUS
  Filled 2012-08-02: qty 2

## 2012-08-02 MED ORDER — ACETAMINOPHEN 650 MG RE SUPP: 650.0000 mg | Freq: Four times a day (QID) | RECTAL | Status: DC | PRN

## 2012-08-02 MED ORDER — LOPERAMIDE HCL 2 MG PO CAPS
4.0000 mg | ORAL_CAPSULE | Freq: Once | ORAL | Status: AC
  Administered 2012-08-02: 4 mg via ORAL
  Filled 2012-08-02: qty 2

## 2012-08-02 MED ORDER — ONDANSETRON HCL 4 MG/2ML IJ SOLN: 4.0000 mg | Freq: Four times a day (QID) | INTRAMUSCULAR | Status: AC | PRN

## 2012-08-02 MED ORDER — HEPARIN SODIUM (PORCINE) 5000 UNIT/ML IJ SOLN
5000.0000 [IU] | Freq: Three times a day (TID) | INTRAMUSCULAR | Status: DC
  Administered 2012-08-02 – 2012-08-03 (×2): 5000 [IU] via SUBCUTANEOUS
  Filled 2012-08-02 (×6): qty 1

## 2012-08-02 MED ORDER — POTASSIUM CHLORIDE IN NACL 40-0.9 MEQ/L-% IV SOLN
INTRAVENOUS | Status: DC
  Administered 2012-08-02 – 2012-08-03 (×2): via INTRAVENOUS
  Filled 2012-08-02 (×4): qty 1000

## 2012-08-02 MED ORDER — SODIUM CHLORIDE 0.9 % IJ SOLN
3.0000 mL | Freq: Two times a day (BID) | INTRAMUSCULAR | Status: DC
  Administered 2012-08-02: 3 mL via INTRAVENOUS

## 2012-08-02 MED ORDER — MORPHINE SULFATE 2 MG/ML IJ SOLN: 2.0000 mg | INTRAMUSCULAR | Status: DC | PRN

## 2012-08-02 MED ORDER — OXYCODONE HCL 5 MG PO TABS: 5.0000 mg | ORAL_TABLET | ORAL | Status: DC | PRN

## 2012-08-02 MED ORDER — SODIUM CHLORIDE 0.9 % IV BOLUS (SEPSIS): 500.0000 mL | Freq: Once | INTRAVENOUS | Status: DC

## 2012-08-02 NOTE — ED Notes (Signed)
Patient c/o N/V/D since yesterday.  Patient c/o feeling hot but did not take her temperature.  Patient c/o weakness.

## 2012-08-02 NOTE — H&P (Signed)
PCP:   MULBERRY,ELIZABETH, MD   Chief Complaint:  Vomiting and diarrhea for a 2 days.   HPI: This is a 51 year old female, with known history of depression, HTN, type 2 DM, OA, , s/p previous left ankle fracture repair, presenting with vomiting and diarrhea. According to patient, both her parents had developed a diarrheal illness on 07/29/12, and are still symptomatic, although improved. She went to visit them on 07/30/12, to help out, and even made sure she wore gloves and washed her hands. In AM of 08/01/12, she felt unwell in the AM, and developed watery diarrhea, up to 8-9 times on that day. She had no appetite, and did not eat all day, lay in bed, and tried to dink water and sprite, to maintain her hydration. She had no fever, but did experience chills. At about 4:00-4:30 PM the same day, she vomited once, and started experiencing crampy diffuse abdominal pain. This AM, she vomited once more, but has had no vomiting since. She proceeded to the ED, as symptoms were not improving. Patient denies recent antibiotic treatment or exotic travel.  Allergies:  No Known Allergies    Past Medical History  Diagnosis Date  . Depression   . Hypertension   . Diabetes mellitus   . Arthritis     Past Surgical History  Procedure Date  . Ankle surgery     left ankle - was broken    Prior to Admission medications   Medication Sig Start Date End Date Taking? Authorizing Provider  Cholecalciferol (VITAMIN D PO) Take 1 tablet by mouth daily.   Yes Historical Provider, MD  diltiazem (TIAZAC) 360 MG 24 hr capsule Take 360 mg by mouth daily.   Yes Historical Provider, MD  hydrochlorothiazide (HYDRODIURIL) 25 MG tablet Take 25 mg by mouth daily.   Yes Historical Provider, MD  loratadine (CLARITIN) 10 MG tablet Take 10 mg by mouth daily.   Yes Historical Provider, MD  metFORMIN (GLUCOPHAGE) 500 MG tablet Take 500 mg by mouth daily with breakfast.   Yes Historical Provider, MD    Social History:  reports that she has been smoking Cigarettes.  She has a 16.5 pack-year smoking history. She has never used smokeless tobacco. She reports that she does not drink alcohol or use illicit drugs.  Family History  Problem Relation Age of Onset  . Diabetes Mother   . COPD Mother   . Hypertension Mother   . Diabetes Father   . Hypertension Father     Review of Systems:  As per HPI and chief complaint. Patent feels fatigued, has diminished appetite, denies weight loss or fever, has chills, but no headache, blurred vision, difficulty in speaking, dysphagia, chest pain, cough, shortness of breath, orthopnea, paroxysmal nocturnal dyspnea, nausea, diaphoresis, belching, heartburn, hematemesis, melena, dysuria, nocturia, urinary frequency, hematochezia, lower extremity swelling, pain, or redness. The rest of the systems review is negative.  Physical Exam:  General:  Patient does not appear to be in obvious acute distress. Alert, communicative, fully oriented, talking in complete sentences, not short of breath at rest.  HEENT:  No clinical pallor, no jaundice, no conjunctival injection or discharge. Buccal mucosa appears "dry". NECK:  Supple, JVP not seen, no carotid bruits, no palpable lymphadenopathy, no palpable goiter. CHEST:  Clinically clear to auscultation, no wheezes, no crackles. HEART:  Sounds 1 and 2 heard, normal, regular, no murmurs. ABDOMEN:  Moderately obese, soft, non-tender although unconmfortable, no palpable organomegaly, no palpable masses, brisk bowel sounds. GENITALIA:  Not  examined. LOWER EXTREMITIES:  No pitting edema, palpable peripheral pulses. MUSCULOSKELETAL SYSTEM:  Unremarkable. CENTRAL NERVOUS SYSTEM:  No focal neurologic deficit on gross examination.  Labs on Admission:  Results for orders placed during the hospital encounter of 08/02/12 (from the past 48 hour(s))  POCT I-STAT, CHEM 8     Status: Abnormal   Collection Time   08/02/12  2:36 PM      Component Value  Range Comment   Sodium 143  135 - 145 mEq/L    Potassium 3.4 (*) 3.5 - 5.1 mEq/L    Chloride 101  96 - 112 mEq/L    BUN 18  6 - 23 mg/dL    Creatinine, Ser 8.29 (*) 0.50 - 1.10 mg/dL    Glucose, Bld 562 (*) 70 - 99 mg/dL    Calcium, Ion 1.30  8.65 - 1.23 mmol/L    TCO2 29  0 - 100 mmol/L    Hemoglobin 15.6 (*) 12.0 - 15.0 g/dL    HCT 78.4  69.6 - 29.5 %   CBC WITH DIFFERENTIAL     Status: Abnormal   Collection Time   08/02/12  2:45 PM      Component Value Range Comment   WBC 4.4  4.0 - 10.5 K/uL    RBC 5.15 (*) 3.87 - 5.11 MIL/uL    Hemoglobin 15.1 (*) 12.0 - 15.0 g/dL    HCT 28.4  13.2 - 44.0 %    MCV 85.8  78.0 - 100.0 fL    MCH 29.3  26.0 - 34.0 pg    MCHC 34.2  30.0 - 36.0 g/dL    RDW 10.2  72.5 - 36.6 %    Platelets 246  150 - 400 K/uL    Neutrophils Relative 48  43 - 77 %    Neutro Abs 2.1  1.7 - 7.7 K/uL    Lymphocytes Relative 39  12 - 46 %    Lymphs Abs 1.7  0.7 - 4.0 K/uL    Monocytes Relative 11  3 - 12 %    Monocytes Absolute 0.5  0.1 - 1.0 K/uL    Eosinophils Relative 2  0 - 5 %    Eosinophils Absolute 0.1  0.0 - 0.7 K/uL    Basophils Relative 0  0 - 1 %    Basophils Absolute 0.0  0.0 - 0.1 K/uL     Radiological Exams on Admission: No results found.  Assessment/Plan Active Problems:    1. Acute gastroenteritis: Patient presents with 2 days of abdominal pain, vomiting and diarrhea, following sick contacts with a diarrheal illness. She has no pyrexia or leukocytosis, ad clinically, appears to have a viral acute gastroenteritis. We shall admit for supportive management with bowel rest, iv fluids, antiemetics, H2RA. For completeness, we shall check stool for C. Difficle PCR, although index of suspicion is low. For completeness, will check Lipase levels.  2. Dehydration/AKI (acute kidney injury): Patient is clinically dehydrated, due to #1 above, in the setting of poor oral intake and continued diuretic therapy. Will manage as described above, but will discontinue  Metformin and diuretic. Baseline creatinine was 0.67 on 03/20/2012.  3. Hypokalemia: This is mild, and secondary to GI fluid losses as well as diuretics. Will replete as indicated, and check magnesium for completeness.  4. Diabetes mellitus: This is type 2, and appear reasonably controlled, based on random blood glucose of 134. As patient's oral intake is unreliable, and Metformin is likely to exacerbate diarrhea, we shall mage with  SSI for now. Will check HBA1C.  5. HTN (hypertension): BP is currently borderline low at 106/71-97/80. This is likely due to dehydration and volume depletion, so will bolus iv fluid, hold pre-admission anti-hypertensives and monitor.  6. Tobacco abuse: Patient smokes about 1/3 ppd. She has been counseled appropriately.   Further management will depend on clinical course.  Comment: Patient is FULL CODE.  Time Spent on Admission: 45 mins.   Ebany Bowermaster,CHRISTOPHER 08/02/2012, 3:59 PM

## 2012-08-02 NOTE — ED Provider Notes (Signed)
History     CSN: [id number]  Arrival date & time 08/02/12  1158   First MD Initiated Contact with Patient 08/02/12 1256      Chief Complaint  Patient presents with  . Emesis  . Diarrhea    (Consider location/radiation/quality/duration/timing/severity/associated sxs/prior treatment) HPI  Patient reports yesterday morning she started feeling bad and had about 7-8 episodes of diarrhea yesterday and twice today . She vomited once yesterday and once today. She states she feels cold and she has diffuse abdominal pain described as soreness. She states she has a dry mouth and feels weak and dizzy. She reports her father's in ED with similar symptoms, however they do not live together but she sees him daily to help him.  [facility]   Past Medical History  Diagnosis Date  . Depression   . Hypertension   . Diabetes mellitus   . Arthritis     History reviewed. No pertinent past surgical history.  History reviewed. No pertinent family history.  History  Substance Use Topics  . Smoking status: Current Every Day Smoker  . Smokeless tobacco: Not on file  . Alcohol Use: No  employed  OB History    Grav Para Term Preterm Abortions TAB SAB Ect Mult Living                  Review of Systems  All other systems reviewed and are negative.    Allergies  Review of patient's allergies indicates no known allergies.  Home Medications   Current Outpatient Rx  Name  Route  Sig  Dispense  Refill  . VITAMIN D PO   Oral   Take 1 tablet by mouth daily.         [name] DILTIAZEM HCL ER BEADS 360 MG PO CP24   Oral   Take 360 mg by mouth daily.         [name] HYDROCHLOROTHIAZIDE 25 MG PO TABS   Oral   Take 25 mg by mouth daily.         [name] LORATADINE 10 MG PO TABS   Oral   Take 10 mg by mouth daily.         [name] METFORMIN HCL 500 MG PO TABS   Oral   Take 500 mg by mouth daily with breakfast.           BP 97/80  Pulse 105  Temp 98.1 F (36.7 C) (Oral)  Resp 20   Ht 5\' 3"  (1.6 m)  Wt 170 lb (77.111 kg)  BMI 30.11 kg/m2  SpO2 98%  Vital signs normal except hypotension and tachycardia   Physical Exam  Nursing note and vitals reviewed. Constitutional: She is oriented to person, place, and time. She appears well-developed and well-nourished.  Non-toxic appearance. She does not appear ill. No distress.       Looks like she feels bad  HENT:  Head: Normocephalic and atraumatic.  Right Ear: External ear normal.  Left Ear: External ear normal.  Nose: Nose normal. No mucosal edema or rhinorrhea.  Mouth/Throat: Mucous membranes are normal. No dental abscesses or uvula swelling.       Tongue dry  Eyes: Conjunctivae normal and EOM are normal. Pupils are equal, round, and reactive to light.  Neck: Normal range of motion and full passive range of motion without pain. Neck supple.  Cardiovascular: Normal rate, regular rhythm and normal heart sounds.  Exam reveals no gallop and no friction rub.   No murmur  heard. Pulmonary/Chest: Effort normal and breath sounds normal. No respiratory distress. She has no wheezes. She has no rhonchi. She has no rales. She exhibits no tenderness and no crepitus.  Abdominal: Soft. Normal appearance and bowel sounds are normal. She exhibits no distension. There is tenderness. There is no rebound and no guarding.       Mild diffuse tenderness  Musculoskeletal: Normal range of motion. She exhibits no edema and no tenderness.       Moves all extremities well.   Neurological: She is alert and oriented to person, place, and time. She has normal strength. No cranial nerve deficit.  Skin: Skin is warm, dry and intact. No rash noted. No erythema. No pallor.  Psychiatric: Her speech is normal and behavior is normal. Her mood appears not anxious.       Flat affect    ED Course  Procedures (including critical care time)   Medications  0.9 %  sodium chloride infusion (1000 mL Intravenous New Bag/Given 08/02/12 1436)    Followed by   0.9 %  sodium chloride infusion (not administered)  ondansetron (ZOFRAN) injection 4 mg (4 mg Intravenous Given 08/02/12 1436)  loperamide (IMODIUM) capsule 4 mg (4 mg Oral Given 08/02/12 1436)   PT feeling better, discussed lab results and she is agreeable to be admitted.  15:43 Dr Brien Few, admission to med-surg, team 6 (not obs)  Results for orders placed during the hospital encounter of 08/02/12  POCT I-STAT, CHEM 8      Component Value Range   Sodium 143  135 - 145 mEq/L   Potassium 3.4 (*) 3.5 - 5.1 mEq/L   Chloride 101  96 - 112 mEq/L   BUN 18  6 - 23 mg/dL   Creatinine, Ser 1.61 (*) 0.50 - 1.10 mg/dL   Glucose, Bld 096 (*) 70 - 99 mg/dL   Calcium, Ion 0.45  4.09 - 1.23 mmol/L   TCO2 29  0 - 100 mmol/L   Hemoglobin 15.6 (*) 12.0 - 15.0 g/dL   HCT 81.1  91.4 - 78.2 %  CBC WITH DIFFERENTIAL      Component Value Range   WBC 4.4  4.0 - 10.5 K/uL   RBC 5.15 (*) 3.87 - 5.11 MIL/uL   Hemoglobin 15.1 (*) 12.0 - 15.0 g/dL   HCT 95.6  21.3 - 08.6 %   MCV 85.8  78.0 - 100.0 fL   MCH 29.3  26.0 - 34.0 pg   MCHC 34.2  30.0 - 36.0 g/dL   RDW 57.8  46.9 - 62.9 %   Platelets 246  150 - 400 K/uL   Neutrophils Relative 48  43 - 77 %   Neutro Abs 2.1  1.7 - 7.7 K/uL   Lymphocytes Relative 39  12 - 46 %   Lymphs Abs 1.7  0.7 - 4.0 K/uL   Monocytes Relative 11  3 - 12 %   Monocytes Absolute 0.5  0.1 - 1.0 K/uL   Eosinophils Relative 2  0 - 5 %   Eosinophils Absolute 0.1  0.0 - 0.7 K/uL   Basophils Relative 0  0 - 1 %   Basophils Absolute 0.0  0.0 - 0.1 K/uL   Laboratory interpretation all normal except since August new renal insufficiency, mild hypokalemia    1. Vomiting and diarrhea   2. Dehydration   3. Renal insufficiency    Plan admission  Devoria Albe, MD, Armando Gang    MDM  Ward Givens, MD 08/02/12 [phone]

## 2012-08-02 NOTE — Progress Notes (Signed)
Pt confirms she has not seen Dr Delrae Alfred since 2012 Reports not having a pcp presently CM discussed and provided written information for self pay pcps, importance of pcp for f/u care, www.needymeds.org, discounted pharmacies, and other [place] resources such as financial assistance, DSS and  health department Reviewed Health connect number to assist with finding self pay provider close to pt's residence. Reviewed resources for [facility], general medical clinics, [facility] out patient [facility]ies, housing, and other resources in [employer]. Pt voiced understanding and appreciation of resources provided

## 2012-08-02 NOTE — Progress Notes (Signed)
CM reviewed and provided $4 medication lists for [facility], [facility] and [facility] for pt to indicate that her medications are listed for $4 for 30 days and $10 for 60+days. Pt confirms she was a previous [facility] pt who had been given a 90 day supply of [facility] from [facility] prior to the facility closing on 04/12/12 Pt reports she still has medications. Pt has not contacted [facility] to see if her medication profile is at [facility].  She informed CM she would check on this.  CM reviewed with pt that if she chooses she can get medications at [facility] and/or any of the discount pharmacy's $4 programs to assist herself in getting the lowest cost available for her. Pt voiced understanding CM informed her there will be an available CM on her unit to ask further questions and to obtain assistance from if needed

## 2012-08-03 LAB — CBC
MCV: 86.1 fL (ref 78.0–100.0)
Platelets: 194 10*3/uL (ref 150–400)
RBC: 4.62 MIL/uL (ref 3.87–5.11)
RDW: 14.5 % (ref 11.5–15.5)
WBC: 4.2 10*3/uL (ref 4.0–10.5)

## 2012-08-03 LAB — COMPREHENSIVE METABOLIC PANEL
Albumin: 2.7 g/dL — ABNORMAL LOW (ref 3.5–5.2)
BUN: 13 mg/dL (ref 6–23)
Chloride: 108 mEq/L (ref 96–112)
Creatinine, Ser: 0.84 mg/dL (ref 0.50–1.10)
GFR calc Af Amer: >90 mL/min (ref >90)
GFR calc non Af Amer: 80 mL/min — ABNORMAL LOW (ref >90)
Glucose, Bld: 143 mg/dL — ABNORMAL HIGH (ref 70–99)
Total Bilirubin: 0.2 mg/dL — ABNORMAL LOW (ref 0.3–1.2)

## 2012-08-03 LAB — HEMOGLOBIN A1C: Hgb A1c MFr Bld: 7.2 % — ABNORMAL HIGH (ref <5.7)

## 2012-08-03 LAB — GLUCOSE, CAPILLARY: Glucose-Capillary: 91 mg/dL (ref 70–99)

## 2012-08-03 LAB — CLOSTRIDIUM DIFFICILE BY PCR: Toxigenic C. Difficile by PCR: POSITIVE — AB

## 2012-08-03 LAB — LIPASE, BLOOD: Lipase: 31 U/L (ref 11–59)

## 2012-08-03 MED ORDER — FAMOTIDINE 20 MG PO TABS: 20.0000 mg | ORAL_TABLET | Freq: Two times a day (BID) | ORAL | Status: DC

## 2012-08-03 MED ORDER — INSULIN ASPART 100 UNIT/ML ~~LOC~~ SOLN: 0.0000 [IU] | Freq: Three times a day (TID) | SUBCUTANEOUS | Status: DC

## 2012-08-03 MED ORDER — OXYCODONE HCL 5 MG PO TABS: 5.0000 mg | ORAL_TABLET | Freq: Four times a day (QID) | ORAL | Status: DC | PRN

## 2012-08-03 NOTE — Progress Notes (Signed)
[facility] PROGRESS NOTE  Krista [name]:[id number] DOB: 01/26/1961 DOA: 08/02/2012 PCP: No primary provider on file.  Assessment/Plan: Active Problems:  Acute gastroenteritis  Dehydration  AKI (acute kidney injury)  Hypokalemia  Diabetes mellitus  HTN (hypertension)  Tobacco abuse    1. Acute gastroenteritis: Patient presents with 2 days of abdominal pain, vomiting and diarrhea, following sick contacts with a diarrheal illness. She had no pyrexia or leukocytosis, and clinically, appears to have a viral acute gastroenteritis. Placed on supportive management with bowel rest, iv fluids, antiemetics, H2RA, with satisfactory clinical response. As of AM 08/03/12, she was completely asymptomatic. Lipase was normal at 24, stool for C. Difficle PCR is pending. Diet will be advanced.  2. Dehydration/AKI (acute kidney injury): Patient was clinically dehydrated at presentation, due to #1 above, in the setting of poor oral intake and continued diuretic therapy. Manage as described above, Metformin and diuretic were placed on hold, and as of this AM, hydration sattus is satisfactory, with creatinine back to baseline, at 0.84.  3. Hypokalemia: This was mild at 3.4, and secondary to GI fluid losses as well as diuretics. Repletd as indicated. Resolved. Magnesium was normal at 2.2. 4. Diabetes mellitus: This is type 2, and appears reasonably controlled, based on random blood glucose of 134. As patient's oral intake is unreliable, and Metformin is likely to exacerbate diarrhea, she was managed with SSI for now, and remained normoglycemic. HBA1C was 7.2.  5. HTN (hypertension): BP was borderline low at 106/71-97/80 on presentation, consistent with dehydration and volume depletion. Addressed with Saline bolus, pre-admission anti-hypertensives were held, ad as of 08/03/12, she was normotensive. She has been instructed to recommence Cardizem CD on 08/04/12, but diuretic has been discontinued. 6.  Tobacco abuse: Patient smokes about 1/3 ppd. She was counseled appropriately.    Code Status: Full Code.  Family Communication:  Disposition Plan: Aiming possible discharge later today.    Brief narrative: 51 year old female, with known history of depression, HTN, type 2 DM, OA, , s/p previous left ankle fracture repair, presenting with vomiting and diarrhea. According to patient, both her parents had developed a diarrheal illness on 07/29/12, and are still symptomatic, although improved. She went to visit them on 07/30/12, to help out, and even made sure she wore gloves and washed her hands. In AM of 08/01/12, she felt unwell in the AM, and developed watery diarrhea, up to 8-9 times on that day. She had no appetite, and did not eat all day, lay in bed, and tried to dink water and sprite, to maintain her hydration. She had no fever, but did experience chills. At about 4:00-4:30 PM the same day, she vomited once, and started experiencing crampy diffuse abdominal pain. In AM of 08/02/12, she vomited once more, but has had no vomiting since. She proceeded to the ED, as symptoms were not improving. Patient denies recent antibiotic treatment or exotic travel.   Consultants:  N/A.   Procedures:  N/A.   Antibiotics:  N/A.   HPI/Subjective: Asymptomatic.   Objective: Vital signs in last 24 hours: Temp:  [97.8 F (36.6 C)-98.5 F (36.9 C)] 98.5 F (36.9 C) (12/21 0600) Pulse Rate:  [80-105] 86  (12/21 0600) Resp:  [18-20] 20  (12/21 0600) BP: (95-118)/(71-83) 110/71 mmHg (12/21 0600) SpO2:  [94 %-98 %] 94 % (12/21 0600) Weight:  [77.111 kg (170 lb)-80.1 kg (176 lb 9.4 oz)] 80.1 kg (176 lb 9.4 oz) (12/20 1700) Weight change:  Last BM Date: 08/02/12  Intake/Output  from previous day: 12/20 0701 - 12/21 0700 In: 1210 [I.V.:1210] Out: -      Physical Exam: General: Patient does not appear to be in obvious acute distress. Alert, communicative, fully oriented, talking in complete  sentences, not short of breath at rest.  HEENT: No clinical pallor, no jaundice, no conjunctival injection or discharge. Hydration status is satisfactory.  NECK: Supple, JVP not seen, no carotid bruits, no palpable lymphadenopathy, no palpable goiter.  CHEST: Clinically clear to auscultation, no wheezes, no crackles.  HEART: Sounds 1 and 2 heard, normal, regular, no murmurs.  ABDOMEN: Moderately obese, soft, non-tender, no palpable organomegaly, no palpable masses, brisk bowel sounds.  GENITALIA: Not examined.  LOWER EXTREMITIES: No pitting edema, palpable peripheral pulses.  MUSCULOSKELETAL SYSTEM: Unremarkable.  CENTRAL NERVOUS SYSTEM: No focal neurologic deficit on gross examination.  Lab Results:  Basename 08/03/12 0530 08/02/12 1445  WBC 4.2 4.4  HGB 13.4 15.1*  HCT 39.8 44.2  PLT 194 246    Basename 08/03/12 0530 08/02/12 1436  NA 139 143  K 3.9 3.4*  CL 108 101  CO2 24 --  GLUCOSE 143* 134*  BUN 13 18  CREATININE 0.84 1.20*  CALCIUM 8.0* --   No results found for this or any previous visit (from the past 240 hour(s)).   Studies/Results: No results found.  Medications: Scheduled Meds:   . famotidine (PEPCID) IV  20 mg Intravenous Q12H  . heparin  5,000 Units Subcutaneous Q8H  . insulin aspart  0-9 Units Subcutaneous Q4H  . sodium chloride  500 mL Intravenous Once  . sodium chloride  3 mL Intravenous Q12H   Continuous Infusions:   . 0.9 % NaCl with KCl 40 mEq / L 150 mL/hr at 08/02/12 2056   PRN Meds:.sodium chloride, acetaminophen, acetaminophen, morphine injection, oxyCODONE, sodium chloride    LOS: 1 day   Aminta Sakurai,CHRISTOPHER  [facility] Pager [phone]. If 8PM-8AM, please contact night-coverage at [email or web address], password [facility] 08/03/2012, 9:48 AM  LOS: 1 day

## 2012-08-03 NOTE — Discharge Summary (Signed)
Physician Discharge Summary  Krista [name] NFA:[id number] DOB: 11-23-1960 DOA: 08/02/2012  PCP: No primary provider on file.  Admit date: 08/02/2012 Discharge date: 08/03/2012  Time spent: 40 minutes  Recommendations for Outpatient Follow-up:  1. Follow up with PMD.   Discharge Diagnoses:  Active Problems:  Acute gastroenteritis  Dehydration  AKI (acute kidney injury)  Hypokalemia  Diabetes mellitus  HTN (hypertension)  Tobacco abuse   Discharge Condition: Satisfactory.  Diet recommendation: Heart-Healthy/Carbohydrate-Modified.  Filed Weights   08/02/12 1207 08/02/12 1700  Weight: 77.111 kg (170 lb) 80.1 kg (176 lb 9.4 oz)    History of present illness:  51 year old female, with known history of depression, HTN, type 2 DM, OA, , s/p previous left ankle fracture repair, presenting with vomiting and diarrhea. According to patient, both her parents had developed a diarrheal illness on 07/29/12, and are still symptomatic, although improved. She went to visit them on 07/30/12, to help out, and even made sure she wore gloves and washed her hands. In AM of 08/01/12, she felt unwell in the AM, and developed watery diarrhea, up to 8-9 times on that day. She had no appetite, and did not eat all day, lay in bed, and tried to dink water and sprite, to maintain her hydration. She had no fever, but did experience chills. At about 4:00-4:30 PM the same day, she vomited once, and started experiencing crampy diffuse abdominal pain. In AM of 08/02/12, she vomited once more, but has had no vomiting since. She proceeded to the ED, as symptoms were not improving. Patient denies recent antibiotic treatment or exotic travel.   Hospital Course:  1. Acute gastroenteritis: Patient presents with 2 days of abdominal pain, vomiting and diarrhea, following sick contacts with a diarrheal illness. She had no pyrexia or leukocytosis, and clinically, appears to have a viral acute gastroenteritis. Placed on  supportive management with bowel rest, iv fluids, antiemetics, H2RA, with satisfactory clinical response. As of AM 08/03/12, she was completely asymptomatic. Lipase was normal at 24, and she tolerated a regular diet, without any deleterious effects. Stool C. Difficle PCR is pending.  2. Dehydration/AKI (acute kidney injury): Patient was clinically dehydrated at presentation, due to #1 above, in the setting of poor oral intake and continued diuretic therapy. Managed as described above, Metformin and diuretic were placed on hold, and as of this AM, hydration sattus is satisfactory, with creatinine back to baseline, at 0.84.  3. Hypokalemia: This was mild at 3.4, and secondary to GI fluid losses as well as diuretics. Repletd as indicated. Resolved. Magnesium was normal at 2.2.  4. Diabetes mellitus: This is type 2, and appears reasonably controlled, based on random blood glucose of 134. As patient's oral intake is unreliable, and Metformin is likely to exacerbate diarrhea, she was managed with SSI for now, and remained normoglycemic. HBA1C was 7.2. Metformin will be recommenced on 08/04/12.  5. HTN (hypertension): BP was borderline low at 106/71-97/80 on presentation, consistent with dehydration and volume depletion. Addressed with Saline bolus, pre-admission anti-hypertensives were held, and as of 08/03/12, she was normotensive. She has been instructed to recommence Cardizem CD on 08/04/12, but diuretic has been discontinued.  6. Tobacco abuse: Patient smokes about 1/3 ppd. She was counseled appropriately.    Procedures:  N/A.   Consultations:  N/A.   Discharge Exam: Filed Vitals:   08/02/12 1333 08/02/12 1700 08/02/12 2212 08/03/12 0600  BP: 97/80 118/83 118/79 110/71  Pulse: 105 87 80 86  Temp:  97.8 F (36.6  C) 98.4 F (36.9 C) 98.5 F (36.9 C)  TempSrc:  Oral Oral Oral  Resp:  18 18 20   Height:  5\' 3"  (1.6 m)    Weight:  80.1 kg (176 lb 9.4 oz)    SpO2:  95% 95% 94%    General:  Patient does not appear to be in obvious acute distress. Alert, communicative, fully oriented, talking in complete sentences, not short of breath at rest.  HEENT: No clinical pallor, no jaundice, no conjunctival injection or discharge. Hydration status is satisfactory.  NECK: Supple, JVP not seen, no carotid bruits, no palpable lymphadenopathy, no palpable goiter.  CHEST: Clinically clear to auscultation, no wheezes, no crackles.  HEART: Sounds 1 and 2 heard, normal, regular, no murmurs.  ABDOMEN: Moderately obese, soft, non-tender, no palpable organomegaly, no palpable masses, brisk bowel sounds.  GENITALIA: Not examined.  LOWER EXTREMITIES: No pitting edema, palpable peripheral pulses.  MUSCULOSKELETAL SYSTEM: Unremarkable.  CENTRAL NERVOUS SYSTEM: No focal neurologic deficit on gross examination.  Discharge Instructions      Discharge Orders    Future Orders Please Complete By Expires   Diet - low sodium heart healthy      Diet Carb Modified      Increase activity slowly          Medication List     As of 08/03/2012  1:21 PM    STOP taking these medications         hydrochlorothiazide 25 MG tablet   Commonly known as: HYDRODIURIL      TAKE these medications         diltiazem 360 MG 24 hr capsule   Commonly known as: TIAZAC   Take 360 mg by mouth daily.      famotidine 20 MG tablet   Commonly known as: PEPCID   Take 1 tablet (20 mg total) by mouth 2 (two) times daily.      loratadine 10 MG tablet   Commonly known as: CLARITIN   Take 10 mg by mouth daily.      metFORMIN 500 MG tablet   Commonly known as: GLUCOPHAGE   Take 500 mg by mouth daily with breakfast.      oxyCODONE 5 MG immediate release tablet   Commonly known as: Oxy IR/ROXICODONE   Take 1 tablet (5 mg total) by mouth every 6 (six) hours as needed.      VITAMIN D PO   Take 1 tablet by mouth daily.        Follow-up Information    Please follow up. (Follow up with Primary MD as needed)            The results of significant diagnostics from this hospitalization (including imaging, microbiology, ancillary and laboratory) are listed below for reference.    Significant Diagnostic Studies: No results found.  Microbiology: No results found for this or any previous visit (from the past 240 hour(s)).   Labs: Basic Metabolic Panel:  Lab 08/03/12 8413 08/02/12 1445 08/02/12 1436  NA 139 -- 143  K 3.9 -- 3.4*  CL 108 -- 101  CO2 24 -- --  GLUCOSE 143* -- 134*  BUN 13 -- 18  CREATININE 0.84 -- 1.20*  CALCIUM 8.0* -- --  MG -- 2.2 --  PHOS -- -- --   Liver Function Tests:  Lab 08/03/12 0530  AST 13  ALT 15  ALKPHOS 45  BILITOT 0.2*  PROT 5.7*  ALBUMIN 2.7*    Lab 08/03/12 0530 08/02/12 1445  LIPASE 31 24  AMYLASE -- --   No results found for this basename: AMMONIA:5 in the last 168 hours CBC:  Lab 08/03/12 0530 08/02/12 1445 08/02/12 1436  WBC 4.2 4.4 --  NEUTROABS -- 2.1 --  HGB 13.4 15.1* 15.6*  HCT 39.8 44.2 46.0  MCV 86.1 85.8 --  PLT 194 246 --   Cardiac Enzymes: No results found for this basename: CKTOTAL:5,CKMB:5,CKMBINDEX:5,TROPONINI:5 in the last 168 hours BNP: BNP (last 3 results) No results found for this basename: PROBNP:3 in the last 8760 hours CBG:  Lab 08/03/12 1025 08/03/12 0744 08/03/12 0421 08/03/12 08/02/12 2002  GLUCAP 111* 96 105* 91 128*       Signed:  Sheldon Amara,CHRISTOPHER  [facility] 08/03/2012, 1:21 PM

## 2012-08-04 NOTE — Progress Notes (Signed)
Called by lab overnight that Ms. Lehan' stool came back positive for C-diff.  Called patient this morning to apprise her. She was feeling fine with no diarrhea, nausea or vomiting. No previous episodes of c diff. Denied any abdominal pain.  She will need to be treated with Metronidazole for 14 days. Patient requested that this be called to [facility] at [phone]. I did so today.  She was asked to seek attention if her condition worsens.  Turki Tapanes 08/04/2012 11:21 AM

## 2015-05-20 ENCOUNTER — Ambulatory Visit: Payer: Self-pay | Attending: Internal Medicine

## 2015-11-22 ENCOUNTER — Encounter (HOSPITAL_COMMUNITY): Payer: Self-pay | Admitting: Emergency Medicine

## 2015-11-22 ENCOUNTER — Ambulatory Visit (INDEPENDENT_AMBULATORY_CARE_PROVIDER_SITE_OTHER): Payer: Self-pay

## 2015-11-22 ENCOUNTER — Ambulatory Visit (HOSPITAL_COMMUNITY)
Admission: EM | Admit: 2015-11-22 | Discharge: 2015-11-22 | Disposition: A | Discharge status: Home / Self Care | Payer: Self-pay | Attending: Emergency Medicine | Admitting: Emergency Medicine

## 2015-11-22 DIAGNOSIS — S93402A Sprain of unspecified ligament of left ankle, initial encounter: Secondary | ICD-10-CM

## 2015-11-22 MED ORDER — HYDROCODONE-ACETAMINOPHEN 5-325 MG PO TABS
ORAL_TABLET | ORAL | Status: AC
  Filled 2015-11-22: qty 1

## 2015-11-22 MED ORDER — TRAMADOL-ACETAMINOPHEN 37.5-325 MG PO TABS: 1.0000 | ORAL_TABLET | ORAL | Status: DC | PRN

## 2015-11-22 MED ORDER — HYDROCODONE-ACETAMINOPHEN 5-325 MG PO TABS
1.0000 | ORAL_TABLET | Freq: Once | ORAL | Status: AC
  Administered 2015-11-22: 1 via ORAL

## 2015-11-22 NOTE — ED Provider Notes (Signed)
CSN: [id number]     Arrival date & time 11/22/15  1730 History   First MD Initiated Contact with Patient 11/22/15 1921     Chief Complaint  Patient presents with  . Ankle Pain   (Consider location/radiation/quality/duration/timing/severity/associated sxs/prior Treatment) HPI Comments: 55 year old female states that she was on the stairs last night, made a misstep and twisted the left ankle. Complaining of pain primarily to the lateral aspect. Denies injury to the foot. She presented to the urgent care with an Ace wrap on her ankle and using crutches. She has a history of prior surgery to the ankle and currently has 2 screws in the fibula. Pain worse with movement and weightbearing. It is better with all her weight and elevation.   Past Medical History  Diagnosis Date  . Depression   . Hypertension   . Diabetes mellitus   . Arthritis    Past Surgical History  Procedure Laterality Date  . Ankle surgery      left ankle - was broken   Family History  Problem Relation Age of Onset  . Diabetes Mother   . COPD Mother   . Hypertension Mother   . Diabetes Father   . Hypertension Father    Social History  Substance Use Topics  . Smoking status: Current Every Day Smoker -- 0.50 packs/day for 33 years    Types: Cigarettes  . Smokeless tobacco: Never Used  . Alcohol Use: No   OB History    No data available     Review of Systems  Constitutional: Negative.   Respiratory: Negative.   Musculoskeletal: Negative for back pain.       As per history of present illness  Skin: Negative.   Neurological: Negative.   Psychiatric/Behavioral: Negative.   All other systems reviewed and are negative.   Allergies  Review of patient's allergies indicates no known allergies.  Home Medications   Prior to Admission medications   Medication Sig Start Date End Date Taking? Authorizing Provider  Cholecalciferol (VITAMIN D PO) Take 1 tablet by mouth daily.    Historical Provider, MD   diltiazem (TIAZAC) 360 MG 24 hr capsule Take 360 mg by mouth daily.    Historical Provider, MD  famotidine (PEPCID) 20 MG tablet Take 1 tablet (20 mg total) by mouth 2 (two) times daily. 08/03/12   Monika Salk, MD  loratadine (CLARITIN) 10 MG tablet Take 10 mg by mouth daily.    Historical Provider, MD  metFORMIN (GLUCOPHAGE) 500 MG tablet Take 500 mg by mouth daily with breakfast.    Historical Provider, MD  oxyCODONE (OXY IR/ROXICODONE) 5 MG immediate release tablet Take 1 tablet (5 mg total) by mouth every 6 (six) hours as needed. 08/03/12   Monika Salk, MD  traMADol-acetaminophen (ULTRACET) 37.5-325 MG tablet Take 1 tablet by mouth every 4 (four) hours as needed. 11/22/15   Janne Napoleon, NP   Meds Ordered and Administered this Visit   Medications  HYDROcodone-acetaminophen (NORCO/VICODIN) 5-325 MG per tablet 1 tablet (not administered)    BP 145/95 mmHg  Pulse 71  Temp(Src) 97.6 F (36.4 C) (Oral)  SpO2 99% No data found.   Physical Exam  Constitutional: She is oriented to person, place, and time. She appears well-developed and well-nourished. No distress.  Eyes: EOM are normal.  Neck: Normal range of motion. Neck supple.  Cardiovascular: Normal rate.   Pulmonary/Chest: Effort normal. No respiratory distress.  Musculoskeletal: She exhibits no edema.  Minor swelling to the lateral aspect  of the left ankle. Tenderness over in surrounding the lateral malleolus and lesser tenderness to the medial malleolus. No deformity. Full range of motion with dorsiflexion and plantarflexion. Distal neurovascular motor sensory is intact pedal pulses 2+.  Neurological: She is alert and oriented to person, place, and time. She exhibits normal muscle tone.  Skin: Skin is warm and dry.  Psychiatric: She has a normal mood and affect.  Nursing note and vitals reviewed.   ED Course  Procedures (including critical care time)  Labs Review Labs Reviewed - No data to display  Imaging Review Dg Ankle  Complete Left  11/22/2015  CLINICAL DATA:  Twisting injury to left ankle with pain. Initial encounter. EXAM: LEFT ANKLE COMPLETE - 3+ VIEW COMPARISON:  11/27/2004 FINDINGS: Fixation screws are present across the medial malleolus. Previously noted medial malleolar fracture has completely healed with secondary proliferative changes now present of the medial malleolus and medial mortise. Soft tissue swelling present. No evidence of acute fracture or subluxation. IMPRESSION: Soft tissue swelling without evidence of acute fracture. Healed medial malleolar fracture after prior screw fixation. Secondary medial degenerative changes. Electronically Signed   By: Aletta Edouard M.D.   On: 11/22/2015 19:20     Visual Acuity Review  Right Eye Distance:   Left Eye Distance:   Bilateral Distance:    Right Eye Near:   Left Eye Near:    Bilateral Near:         MDM   1. Ankle sprain, left, initial encounter    Rest, ice, elevation and wear your brace the next several days. Usual crutches for the next 2-3 days and start bearing weight as tolerated. Ultracet for pain as directed and ibuprofen 400 mg every 6 hours as needed for pain ASO Meds ordered this encounter  Medications  . HYDROcodone-acetaminophen (NORCO/VICODIN) 5-325 MG per tablet 1 tablet    Sig:   . traMADol-acetaminophen (ULTRACET) 37.5-325 MG tablet    Sig: Take 1 tablet by mouth every 4 (four) hours as needed.    Dispense:  20 tablet    Refill:  0    Order Specific Question:  Supervising Provider    Answer:  Carmela Hurt       Janne Napoleon, NP 11/22/15 1934

## 2015-11-22 NOTE — Discharge Instructions (Signed)
Ankle Sprain Rest, ice, elevation and wear your brace the next several days. Usual crutches for the next 2-3 days and start bearing weight as tolerated. Ultracet for pain as directed and ibuprofen 400 mg every 6 hours as needed for pain An ankle sprain is an injury to the strong, fibrous tissues (ligaments) that hold the bones of your ankle joint together.  CAUSES An ankle sprain is usually caused by a fall or by twisting your ankle. Ankle sprains most commonly occur when you step on the outer edge of your foot, and your ankle turns inward. People who participate in sports are more prone to these types of injuries.  SYMPTOMS   Pain in your ankle. The pain may be present at rest or only when you are trying to stand or walk.  Swelling.  Bruising. Bruising may develop immediately or within 1 to 2 days after your injury.  Difficulty standing or walking, particularly when turning corners or changing directions. DIAGNOSIS  Your caregiver will ask you details about your injury and perform a physical exam of your ankle to determine if you have an ankle sprain. During the physical exam, your caregiver will press on and apply pressure to specific areas of your foot and ankle. Your caregiver will try to move your ankle in certain ways. An X-ray exam may be done to be sure a bone was not broken or a ligament did not separate from one of the bones in your ankle (avulsion fracture).  TREATMENT  Certain types of braces can help stabilize your ankle. Your caregiver can make a recommendation for this. Your caregiver may recommend the use of medicine for pain. If your sprain is severe, your caregiver may refer you to a surgeon who helps to restore function to parts of your skeletal system (orthopedist) or a physical therapist. [place] ice to your injury for 1-2 days or as directed by your caregiver. Applying ice helps to reduce inflammation and pain.  Put ice in a plastic bag.  Place a  towel between your skin and the bag.  Leave the ice on for 15-20 minutes at a time, every 2 hours while you are awake.  Only take over-the-counter or prescription medicines for pain, discomfort, or fever as directed by your caregiver.  Elevate your injured ankle above the level of your heart as much as possible for 2-3 days.  If your caregiver recommends crutches, use them as instructed. Gradually put weight on the affected ankle. Continue to use crutches or a cane until you can walk without feeling pain in your ankle.  If you have a plaster splint, wear the splint as directed by your caregiver. Do not rest it on anything harder than a pillow for the first 24 hours. Do not put weight on it. Do not get it wet. You may take it off to take a shower or bath.  You may have been given an elastic bandage to wear around your ankle to provide support. If the elastic bandage is too tight (you have numbness or tingling in your foot or your foot becomes cold and blue), adjust the bandage to make it comfortable.  If you have an air splint, you may blow more air into it or let air out to make it more comfortable. You may take your splint off at night and before taking a shower or bath. Wiggle your toes in the splint several times per day to decrease swelling. SEEK MEDICAL CARE IF:  You have rapidly increasing bruising or swelling.  Your toes feel extremely cold or you lose feeling in your foot.  Your pain is not relieved with medicine. SEEK IMMEDIATE MEDICAL CARE IF:  Your toes are numb or blue.  You have severe pain that is increasing. MAKE SURE YOU:   Understand these instructions.  Will watch your condition.  Will get help right away if you are not doing well or get worse.   This information is not intended to replace advice given to you by your health care provider. Make sure you discuss any questions you have with your health care provider.   Document Released: 07/31/2005 Document  Revised: 08/21/2014 Document Reviewed: 08/12/2011 Elsevier Interactive Patient Education [employer].

## 2015-11-22 NOTE — ED Notes (Signed)
Here with left ankle swelling and pain radiating to toes after missed step yesterday Rolled ankle Ace wrap and crutches used

## 2017-08-01 ENCOUNTER — Encounter (HOSPITAL_COMMUNITY): Payer: Self-pay | Admitting: Emergency Medicine

## 2017-08-01 ENCOUNTER — Emergency Department (HOSPITAL_COMMUNITY): Payer: Self-pay

## 2017-08-01 ENCOUNTER — Other Ambulatory Visit: Payer: Self-pay

## 2017-08-01 DIAGNOSIS — I10 Essential (primary) hypertension: Secondary | ICD-10-CM | POA: Insufficient documentation

## 2017-08-01 DIAGNOSIS — J40 Bronchitis, not specified as acute or chronic: Secondary | ICD-10-CM | POA: Insufficient documentation

## 2017-08-01 DIAGNOSIS — E119 Type 2 diabetes mellitus without complications: Secondary | ICD-10-CM | POA: Insufficient documentation

## 2017-08-01 DIAGNOSIS — Z7984 Long term (current) use of oral hypoglycemic drugs: Secondary | ICD-10-CM | POA: Insufficient documentation

## 2017-08-01 DIAGNOSIS — F17210 Nicotine dependence, cigarettes, uncomplicated: Secondary | ICD-10-CM | POA: Insufficient documentation

## 2017-08-01 DIAGNOSIS — Z79899 Other long term (current) drug therapy: Secondary | ICD-10-CM | POA: Insufficient documentation

## 2017-08-01 LAB — CBC WITH DIFFERENTIAL/PLATELET
BASOS ABS: 0 10*3/uL (ref 0.0–0.1)
Basophils Relative: 0 %
EOS ABS: 0.2 10*3/uL (ref 0.0–0.7)
EOS PCT: 2 %
HCT: 42.8 % (ref 36.0–46.0)
Hemoglobin: 13.9 g/dL (ref 12.0–15.0)
LYMPHS ABS: 2.1 10*3/uL (ref 0.7–4.0)
LYMPHS PCT: 25 %
MCH: 28 pg (ref 26.0–34.0)
MCHC: 32.5 g/dL (ref 30.0–36.0)
MCV: 86.1 fL (ref 78.0–100.0)
MONO ABS: 0.5 10*3/uL (ref 0.1–1.0)
Monocytes Relative: 6 %
Neutro Abs: 5.5 10*3/uL (ref 1.7–7.7)
Neutrophils Relative %: 67 %
PLATELETS: 286 10*3/uL (ref 150–400)
RBC: 4.97 MIL/uL (ref 3.87–5.11)
RDW: 15.4 % (ref 11.5–15.5)
WBC: 8.4 10*3/uL (ref 4.0–10.5)

## 2017-08-01 LAB — COMPREHENSIVE METABOLIC PANEL
ALT: 13 U/L — AB (ref 14–54)
AST: 23 U/L (ref 15–41)
Albumin: 3.5 g/dL (ref 3.5–5.0)
Alkaline Phosphatase: 58 U/L (ref 38–126)
Anion gap: 11 (ref 5–15)
BUN: 11 mg/dL (ref 6–20)
CHLORIDE: 101 mmol/L (ref 101–111)
CO2: 24 mmol/L (ref 22–32)
Calcium: 8.7 mg/dL — ABNORMAL LOW (ref 8.9–10.3)
Creatinine, Ser: 1.09 mg/dL — ABNORMAL HIGH (ref 0.44–1.00)
GFR, EST NON AFRICAN AMERICAN: 56 mL/min — AB (ref >60)
Glucose, Bld: 287 mg/dL — ABNORMAL HIGH (ref 65–99)
Potassium: 3.3 mmol/L — ABNORMAL LOW (ref 3.5–5.1)
SODIUM: 136 mmol/L (ref 135–145)
Total Bilirubin: 0.6 mg/dL (ref 0.3–1.2)
Total Protein: 6.8 g/dL (ref 6.5–8.1)

## 2017-08-01 NOTE — ED Triage Notes (Signed)
Pt st's she has had cold symptoms for approx 2 weeks.  Non productive cough.  St's she has been using OTC cold meds without relief from symptoms.  Pt also c/o shortness of breath

## 2017-08-02 ENCOUNTER — Emergency Department (HOSPITAL_COMMUNITY)
Admission: EM | Admit: 2017-08-02 | Discharge: 2017-08-02 | Disposition: A | Discharge status: Home / Self Care | Payer: Self-pay | Attending: Emergency Medicine | Admitting: Emergency Medicine

## 2017-08-02 DIAGNOSIS — J40 Bronchitis, not specified as acute or chronic: Secondary | ICD-10-CM

## 2017-08-02 MED ORDER — AEROCHAMBER PLUS FLO-VU LARGE MISC
Status: AC
  Administered 2017-08-02: 03:00:00
  Filled 2017-08-02: qty 1

## 2017-08-02 MED ORDER — ALBUTEROL SULFATE HFA 108 (90 BASE) MCG/ACT IN AERS
2.0000 | INHALATION_SPRAY | Freq: Once | RESPIRATORY_TRACT | Status: AC
  Administered 2017-08-02: 2 via RESPIRATORY_TRACT
  Filled 2017-08-02: qty 6.7

## 2017-08-02 MED ORDER — DOXYCYCLINE HYCLATE 100 MG PO CAPS: 100.0000 mg | ORAL_CAPSULE | Freq: Two times a day (BID) | ORAL | 0 refills | Status: DC

## 2017-08-02 MED ORDER — DOXYCYCLINE HYCLATE 100 MG PO TABS
100.0000 mg | ORAL_TABLET | Freq: Once | ORAL | Status: AC
  Administered 2017-08-02: 100 mg via ORAL
  Filled 2017-08-02: qty 1

## 2017-08-02 NOTE — ED Notes (Signed)
Pt brought to room. Pt is getting changed into gown.

## 2017-08-02 NOTE — ED Provider Notes (Signed)
[facility] EMERGENCY DEPARTMENT Provider Note   CSN: [id number] Arrival date & time: 08/01/17  1927     History   Chief Complaint Chief Complaint  Patient presents with  . Shortness of Breath    HPI Krista [name] is a 56 y.o. female.  The history is provided by the patient.  Cough  This is a new problem. The current episode started more than 2 days ago. The problem has been gradually worsening. The cough is productive of sputum. There has been no fever. Associated symptoms include chest pain, chills and shortness of breath. She is a smoker.  Patient with history of diabetes, history of hypertension presents with cough for the past week She reports cough, congestion, shortness of breath. Reports of chest pain only with cough She denies hemoptysis She is a smoker, but has not smoked in 2 days   Past Medical History:  Diagnosis Date  . Arthritis   . Depression   . Diabetes mellitus   . Hypertension     Patient Active Problem List   Diagnosis Date Noted  . Acute gastroenteritis 08/02/2012  . Dehydration 08/02/2012  . AKI (acute kidney injury) ([place]) 08/02/2012  . Hypokalemia 08/02/2012  . Diabetes mellitus ([place]) 08/02/2012  . HTN (hypertension) 08/02/2012  . Tobacco abuse 08/02/2012  . ALLERGIC RHINITIS WITH CONJUNCTIVITIS 10/21/2010  . ROTATOR CUFF SYNDROME, RIGHT 10/21/2010  . SEBACEOUS CYST, INFECTED 01/31/2010  . GUAIAC POSITIVE STOOL 10/05/2009  . TOBACCO ABUSE 09/21/2009  . RECTAL BLEEDING, HX OF 09/21/2009  . Nonspecific (abnormal) findings on radiological and other examination of body structure 05/19/2009  . RIGHT LOWER LUNG FIELD CRACKLES 05/19/2009  . DIABETES MELLITUS, TYPE II 02/12/2009  . Pain in limb 02/12/2009  . DENTAL CARIES 04/21/2008  . HYPERTENSION 05/01/2007    Past Surgical History:  Procedure Laterality Date  . ANKLE SURGERY     left ankle - was broken    OB History    No data available       Home  Medications    Prior to Admission medications   Medication Sig Start Date End Date Taking? Authorizing Provider  Cholecalciferol (VITAMIN D PO) Take 1 tablet by mouth daily.    [provider]  diltiazem (TIAZAC) 360 MG 24 hr capsule Take 360 mg by mouth daily.    [provider]  doxycycline (VIBRAMYCIN) 100 MG capsule Take 1 capsule (100 mg total) by mouth 2 (two) times daily. One po bid x 7 days 08/02/17   Ripley Fraise, MD  famotidine (PEPCID) 20 MG tablet Take 1 tablet (20 mg total) by mouth 2 (two) times daily. 08/03/12   Monika Salk, MD  loratadine (CLARITIN) 10 MG tablet Take 10 mg by mouth daily.    [provider]  metFORMIN (GLUCOPHAGE) 500 MG tablet Take 500 mg by mouth daily with breakfast.    [provider]  oxyCODONE (OXY IR/ROXICODONE) 5 MG immediate release tablet Take 1 tablet (5 mg total) by mouth every 6 (six) hours as needed. 08/03/12   Monika Salk, MD  traMADol-acetaminophen (ULTRACET) 37.5-325 MG tablet Take 1 tablet by mouth every 4 (four) hours as needed. 11/22/15   Janne Napoleon, NP    Family History Family History  Problem Relation Age of Onset  . Diabetes Mother   . COPD Mother   . Hypertension Mother   . Diabetes Father   . Hypertension Father     Social History Social History   Tobacco Use  .  Smoking status: Current Every Day Smoker    Packs/day: 0.50    Years: 33.00    Pack years: 16.50    Types: Cigarettes  . Smokeless tobacco: Never Used  Substance Use Topics  . Alcohol use: No  . Drug use: No     Allergies   Patient has no known allergies.   Review of Systems Review of Systems  Constitutional: Positive for chills.  Respiratory: Positive for cough and shortness of breath.   Cardiovascular: Positive for chest pain.  All other systems reviewed and are negative.    Physical Exam Updated Vital Signs BP (!) 150/97   Pulse 93   Temp 99.1 F (37.3 C) (Oral)   Resp 16   Ht 1.6 m (5\' 3" )   Wt  81.6 kg (180 lb)   SpO2 96%   BMI 31.89 kg/m   Physical Exam CONSTITUTIONAL: Well developed/well nourished, coughs frequently during exam HEAD: Normocephalic/atraumatic EYES: EOMI/PERRL ENMT: Mucous membranes moist NECK: supple no meningeal signs SPINE/BACK:entire spine nontender CV: S1/S2 noted, no murmurs/rubs/gallops noted Chest  -diffuse tenderness to palpation LUNGS: Crackles in right base, no apparent distress ABDOMEN: soft, nontender, no rebound or guarding, bowel sounds noted throughout abdomen GU:no cva tenderness NEURO: Pt is awake/alert/appropriate, moves all extremitiesx4.  No facial droop.   EXTREMITIES: pulses normal/equal, full ROM SKIN: warm, color normal PSYCH: no abnormalities of mood noted, alert and oriented to situation   ED Treatments / Results  Labs (all labs ordered are listed, but only abnormal results are displayed) Labs Reviewed  COMPREHENSIVE METABOLIC PANEL - Abnormal; Notable for the following components:      Result Value   Potassium 3.3 (*)    Glucose, Bld 287 (*)    Creatinine, Ser 1.09 (*)    Calcium 8.7 (*)    ALT 13 (*)    GFR calc non Af Amer 56 (*)    All other components within normal limits  CBC WITH DIFFERENTIAL/PLATELET    EKG  EKG Interpretation  Date/Time:  Wednesday August 01 2017 19:49:00 EST Ventricular Rate:  95 PR Interval:  154 QRS Duration: 82 QT Interval:  370 QTC Calculation: 464 R Axis:   -62 Text Interpretation:  Normal sinus rhythm Left axis deviation Minimal voltage criteria for LVH, may be normal variant Possible Anterior infarct , age undetermined Abnormal ECG No significant change since last tracing Confirmed by Ripley Fraise [id number]) on 08/02/2017 2:06:34 AM       Radiology Dg Chest 2 View  Result Date: 08/01/2017 CLINICAL DATA:  Cough and shortness of breath EXAM: CHEST  2 VIEW COMPARISON:  May 19, 2009 FINDINGS: There is no edema or consolidation. The heart size and pulmonary vascularity  are normal. No adenopathy. No bone lesions. There is aortic atherosclerosis. IMPRESSION: Aortic atherosclerosis. No edema or consolidation. Aortic Atherosclerosis (ICD10-I70.0). Electronically Signed   By: Lowella Grip III M.D.   On: 08/01/2017 20:49    Procedures Procedures (including critical care time)  Medications Ordered in ED Medications  albuterol (PROVENTIL HFA;VENTOLIN HFA) 108 (90 Base) MCG/ACT inhaler 2 puff (not administered)  doxycycline (VIBRA-TABS) tablet 100 mg (not administered)     Initial Impression / Assessment and Plan / ED Course  I have reviewed the triage vital signs and the nursing notes.  Pertinent labs & imaging results that were available during my care of the patient were reviewed by me and considered in my medical decision making (see chart for details).     Patient here for cough over  the past week He is a smoker, appears congested, but denies hemoptysis, I doubt acute PE or ACS No signs of CHF on x-ray  Her chest x-ray is negative, but she has crackles on exam and has a clinical picture for pneumonia I will start her on oral doxycycline Also will give albuterol as needed for cough She is appropriate for discharge home, no hypoxia noted  Final Clinical Impressions(s) / ED Diagnoses   Final diagnoses:  Bronchitis    ED Discharge Orders        Ordered    doxycycline (VIBRAMYCIN) 100 MG capsule  2 times daily     08/02/17 0224       Ripley Fraise, MD 08/02/17 [phone]

## 2017-08-02 NOTE — ED Notes (Signed)
Pt reports having a cold 2 weeks ago and now she feels SOB. Pt reports chest pain upon coughing however that is the only time she fells pain.

## 2017-08-09 ENCOUNTER — Emergency Department (HOSPITAL_COMMUNITY): Payer: Self-pay

## 2017-08-09 ENCOUNTER — Encounter (HOSPITAL_COMMUNITY): Payer: Self-pay | Admitting: *Deleted

## 2017-08-09 ENCOUNTER — Emergency Department (HOSPITAL_COMMUNITY)
Admission: EM | Admit: 2017-08-09 | Discharge: 2017-08-09 | Disposition: A | Discharge status: Home / Self Care | Payer: Self-pay | Attending: Emergency Medicine | Admitting: Emergency Medicine

## 2017-08-09 DIAGNOSIS — Z7984 Long term (current) use of oral hypoglycemic drugs: Secondary | ICD-10-CM | POA: Insufficient documentation

## 2017-08-09 DIAGNOSIS — Y9389 Activity, other specified: Secondary | ICD-10-CM | POA: Insufficient documentation

## 2017-08-09 DIAGNOSIS — J40 Bronchitis, not specified as acute or chronic: Secondary | ICD-10-CM | POA: Insufficient documentation

## 2017-08-09 DIAGNOSIS — T148XXA Other injury of unspecified body region, initial encounter: Secondary | ICD-10-CM

## 2017-08-09 DIAGNOSIS — E119 Type 2 diabetes mellitus without complications: Secondary | ICD-10-CM | POA: Insufficient documentation

## 2017-08-09 DIAGNOSIS — Y999 Unspecified external cause status: Secondary | ICD-10-CM | POA: Insufficient documentation

## 2017-08-09 DIAGNOSIS — F17210 Nicotine dependence, cigarettes, uncomplicated: Secondary | ICD-10-CM | POA: Insufficient documentation

## 2017-08-09 DIAGNOSIS — I10 Essential (primary) hypertension: Secondary | ICD-10-CM | POA: Insufficient documentation

## 2017-08-09 DIAGNOSIS — Y92410 Unspecified street and highway as the place of occurrence of the external cause: Secondary | ICD-10-CM | POA: Insufficient documentation

## 2017-08-09 DIAGNOSIS — S86912A Strain of unspecified muscle(s) and tendon(s) at lower leg level, left leg, initial encounter: Secondary | ICD-10-CM | POA: Insufficient documentation

## 2017-08-09 MED ORDER — CYCLOBENZAPRINE HCL 5 MG PO TABS: 5.0000 mg | ORAL_TABLET | Freq: Two times a day (BID) | ORAL | 0 refills | Status: DC | PRN

## 2017-08-09 MED ORDER — IPRATROPIUM-ALBUTEROL 0.5-2.5 (3) MG/3ML IN SOLN
3.0000 mL | Freq: Once | RESPIRATORY_TRACT | Status: AC
  Administered 2017-08-09: 3 mL via RESPIRATORY_TRACT
  Filled 2017-08-09: qty 3

## 2017-08-09 MED ORDER — ALBUTEROL SULFATE HFA 108 (90 BASE) MCG/ACT IN AERS
2.0000 | INHALATION_SPRAY | RESPIRATORY_TRACT | Status: DC | PRN
  Administered 2017-08-09: 2 via RESPIRATORY_TRACT
  Filled 2017-08-09: qty 6.7

## 2017-08-09 MED ORDER — PREDNISONE 10 MG (21) PO TBPK: ORAL_TABLET | ORAL | 0 refills | Status: DC
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
## 2017-08-09 NOTE — Discharge Instructions (Signed)
Do not take the muscle relaxer if driving as it will make you sleepy.  Use the inhaler 2 puffs every 4 hours as needed.  While taking the prednisone keep a close check on your blood sugar as the medication can cause your blood sugar to rise. Follow up with your doctor, return here as needed.
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
## 2018-12-13 ENCOUNTER — Emergency Department (HOSPITAL_COMMUNITY): Payer: Medicaid Other

## 2018-12-13 ENCOUNTER — Other Ambulatory Visit: Payer: Self-pay

## 2018-12-13 ENCOUNTER — Encounter (HOSPITAL_COMMUNITY): Payer: Self-pay | Admitting: Emergency Medicine

## 2018-12-13 ENCOUNTER — Ambulatory Visit (HOSPITAL_COMMUNITY)
Admission: EM | Admit: 2018-12-13 | Discharge: 2018-12-13 | Disposition: A | Discharge status: Other Acute Inpatient Hospital | Payer: Self-pay | Attending: Family Medicine | Admitting: Family Medicine

## 2018-12-13 ENCOUNTER — Inpatient Hospital Stay (HOSPITAL_COMMUNITY)
Admission: EM | Admit: 2018-12-13 | Discharge: 2018-12-17 | DRG: 247 | Disposition: A | Discharge status: Home / Self Care | Payer: Medicaid Other | Attending: Internal Medicine | Admitting: Internal Medicine

## 2018-12-13 DIAGNOSIS — N179 Acute kidney failure, unspecified: Secondary | ICD-10-CM | POA: Diagnosis present

## 2018-12-13 DIAGNOSIS — I440 Atrioventricular block, first degree: Secondary | ICD-10-CM | POA: Diagnosis present

## 2018-12-13 DIAGNOSIS — I951 Orthostatic hypotension: Secondary | ICD-10-CM | POA: Diagnosis present

## 2018-12-13 DIAGNOSIS — R0789 Other chest pain: Secondary | ICD-10-CM | POA: Diagnosis present

## 2018-12-13 DIAGNOSIS — R9431 Abnormal electrocardiogram [ECG] [EKG]: Secondary | ICD-10-CM

## 2018-12-13 DIAGNOSIS — Z7984 Long term (current) use of oral hypoglycemic drugs: Secondary | ICD-10-CM

## 2018-12-13 DIAGNOSIS — Z79899 Other long term (current) drug therapy: Secondary | ICD-10-CM

## 2018-12-13 DIAGNOSIS — I25110 Atherosclerotic heart disease of native coronary artery with unstable angina pectoris: Principal | ICD-10-CM | POA: Diagnosis present

## 2018-12-13 DIAGNOSIS — E119 Type 2 diabetes mellitus without complications: Secondary | ICD-10-CM

## 2018-12-13 DIAGNOSIS — Z8249 Family history of ischemic heart disease and other diseases of the circulatory system: Secondary | ICD-10-CM

## 2018-12-13 DIAGNOSIS — H538 Other visual disturbances: Secondary | ICD-10-CM | POA: Diagnosis present

## 2018-12-13 DIAGNOSIS — Z720 Tobacco use: Secondary | ICD-10-CM | POA: Diagnosis present

## 2018-12-13 DIAGNOSIS — I200 Unstable angina: Secondary | ICD-10-CM

## 2018-12-13 DIAGNOSIS — I10 Essential (primary) hypertension: Secondary | ICD-10-CM | POA: Diagnosis present

## 2018-12-13 DIAGNOSIS — R531 Weakness: Secondary | ICD-10-CM

## 2018-12-13 DIAGNOSIS — Z23 Encounter for immunization: Secondary | ICD-10-CM

## 2018-12-13 DIAGNOSIS — E785 Hyperlipidemia, unspecified: Secondary | ICD-10-CM | POA: Diagnosis present

## 2018-12-13 DIAGNOSIS — E860 Dehydration: Secondary | ICD-10-CM | POA: Diagnosis present

## 2018-12-13 DIAGNOSIS — R42 Dizziness and giddiness: Secondary | ICD-10-CM

## 2018-12-13 DIAGNOSIS — Z955 Presence of coronary angioplasty implant and graft: Secondary | ICD-10-CM

## 2018-12-13 DIAGNOSIS — R55 Syncope and collapse: Secondary | ICD-10-CM

## 2018-12-13 DIAGNOSIS — Z825 Family history of asthma and other chronic lower respiratory diseases: Secondary | ICD-10-CM

## 2018-12-13 DIAGNOSIS — F17210 Nicotine dependence, cigarettes, uncomplicated: Secondary | ICD-10-CM | POA: Diagnosis present

## 2018-12-13 DIAGNOSIS — Z833 Family history of diabetes mellitus: Secondary | ICD-10-CM

## 2018-12-13 LAB — URINALYSIS, ROUTINE W REFLEX MICROSCOPIC
Bilirubin Urine: NEGATIVE
Glucose, UA: NEGATIVE mg/dL
Hgb urine dipstick: NEGATIVE
Ketones, ur: NEGATIVE mg/dL
Nitrite: NEGATIVE
Protein, ur: NEGATIVE mg/dL
Specific Gravity, Urine: 1.01 (ref 1.005–1.030)
pH: 7 (ref 5.0–8.0)

## 2018-12-13 LAB — TROPONIN I
Troponin I: <0.03 ng/mL
Troponin I: 0.03 ng/mL (ref <0.03)

## 2018-12-13 LAB — CBC
HCT: 45.3 % (ref 36.0–46.0)
Hemoglobin: 14.8 g/dL (ref 12.0–15.0)
MCH: 27.9 pg (ref 26.0–34.0)
MCHC: 32.7 g/dL (ref 30.0–36.0)
MCV: 85.5 fL (ref 80.0–100.0)
Platelets: 301 10*3/uL (ref 150–400)
RBC: 5.3 MIL/uL — ABNORMAL HIGH (ref 3.87–5.11)
RDW: 14 % (ref 11.5–15.5)
WBC: 7 10*3/uL (ref 4.0–10.5)
nRBC: 0 % (ref 0.0–0.2)

## 2018-12-13 LAB — BASIC METABOLIC PANEL WITH GFR
Anion gap: 15 (ref 5–15)
BUN: 15 mg/dL (ref 6–20)
CO2: 25 mmol/L (ref 22–32)
Calcium: 9.2 mg/dL (ref 8.9–10.3)
Chloride: 95 mmol/L — ABNORMAL LOW (ref 98–111)
Creatinine, Ser: 1.28 mg/dL — ABNORMAL HIGH (ref 0.44–1.00)
GFR calc Af Amer: 54 mL/min — ABNORMAL LOW
GFR calc non Af Amer: 47 mL/min — ABNORMAL LOW
Glucose, Bld: 260 mg/dL — ABNORMAL HIGH (ref 70–99)
Potassium: 3.7 mmol/L (ref 3.5–5.1)
Sodium: 135 mmol/L (ref 135–145)

## 2018-12-13 LAB — MAGNESIUM: Magnesium: 1.9 mg/dL (ref 1.7–2.4)

## 2018-12-13 LAB — GLUCOSE, CAPILLARY
Glucose-Capillary: 195 mg/dL — ABNORMAL HIGH (ref 70–99)
Glucose-Capillary: 216 mg/dL — ABNORMAL HIGH (ref 70–99)

## 2018-12-13 LAB — I-STAT BETA HCG BLOOD, ED (MC, WL, AP ONLY): I-stat hCG, quantitative: <5 m[IU]/mL

## 2018-12-13 LAB — URINALYSIS, MICROSCOPIC (REFLEX)
RBC / HPF: NONE SEEN RBC/hpf (ref 0–5)
Squamous Epithelial / HPF: NONE SEEN (ref 0–5)

## 2018-12-13 MED ORDER — HYDROCHLOROTHIAZIDE 25 MG PO TABS: 25.0000 mg | ORAL_TABLET | Freq: Every day | ORAL | Status: DC

## 2018-12-13 MED ORDER — LORATADINE 10 MG PO TABS
10.0000 mg | ORAL_TABLET | Freq: Every day | ORAL | Status: DC
  Administered 2018-12-14 – 2018-12-17 (×4): 10 mg via ORAL
  Filled 2018-12-13 (×4): qty 1

## 2018-12-13 MED ORDER — SENNOSIDES-DOCUSATE SODIUM 8.6-50 MG PO TABS: 1.0000 | ORAL_TABLET | Freq: Every evening | ORAL | Status: DC | PRN

## 2018-12-13 MED ORDER — ENOXAPARIN SODIUM 40 MG/0.4ML ~~LOC~~ SOLN
40.0000 mg | SUBCUTANEOUS | Status: DC
  Administered 2018-12-13 – 2018-12-15 (×3): 40 mg via SUBCUTANEOUS
  Filled 2018-12-13 (×4): qty 0.4

## 2018-12-13 MED ORDER — INSULIN ASPART 100 UNIT/ML ~~LOC~~ SOLN: 0.0000 [IU] | Freq: Three times a day (TID) | SUBCUTANEOUS | Status: DC

## 2018-12-13 MED ORDER — ACETAMINOPHEN 325 MG PO TABS: 650.0000 mg | ORAL_TABLET | Freq: Four times a day (QID) | ORAL | Status: DC | PRN

## 2018-12-13 MED ORDER — INSULIN ASPART 100 UNIT/ML ~~LOC~~ SOLN
0.0000 [IU] | Freq: Three times a day (TID) | SUBCUTANEOUS | Status: DC
  Administered 2018-12-14: 2 [IU] via SUBCUTANEOUS
  Administered 2018-12-15: 3 [IU] via SUBCUTANEOUS
  Administered 2018-12-15: 2 [IU] via SUBCUTANEOUS
  Administered 2018-12-16 (×2): 3 [IU] via SUBCUTANEOUS
  Administered 2018-12-16: 5 [IU] via SUBCUTANEOUS
  Administered 2018-12-17: 2 [IU] via SUBCUTANEOUS

## 2018-12-13 MED ORDER — ONDANSETRON 4 MG PO TBDP
4.0000 mg | ORAL_TABLET | Freq: Once | ORAL | Status: AC | PRN
  Administered 2018-12-13: 4 mg via ORAL

## 2018-12-13 MED ORDER — ACETAMINOPHEN 650 MG RE SUPP: 650.0000 mg | Freq: Four times a day (QID) | RECTAL | Status: DC | PRN

## 2018-12-13 MED ORDER — INSULIN ASPART 100 UNIT/ML ~~LOC~~ SOLN
0.0000 [IU] | Freq: Every day | SUBCUTANEOUS | Status: DC
  Administered 2018-12-13: 23:00:00 2 [IU] via SUBCUTANEOUS

## 2018-12-13 NOTE — ED Notes (Signed)
ED TO INPATIENT HANDOFF REPORT  ED Nurse Name and Phone #:  Vlad Mayberry   S Name/Age/Gender Krista [name] 58 y.o. female Room/Bed: 014C/014C  Code Status   Code Status: Full Code  Home/SNF/Other Home Patient oriented to: self, place, time and situation Is this baseline? Yes   Triage Complete: Triage complete  Chief Complaint blurred vision/sent by uc  Triage Note Pt in from home with c/o lightheadedness and diaphoresis that began suddenly while riding in the car. States she also had blurred vision which is now resolving. Sent down from UC. Has DM, last CBG 198. Just feels "weak", states she has been coughing since last night. Emesis x 1 in triage   Allergies No Known Allergies  Level of Care/Admitting Diagnosis ED Disposition    ED Disposition Condition [facility] Area: [place] [100100]  Level of Care: Telemetry Cardiac [103]  Covid Evaluation: N/A  Diagnosis: Chest tightness [160737]  Admitting Physician: Sid Falcon [[id number]]  Attending Physician: Sid Falcon [[id number]]  PT Class (Do Not Modify): Observation [104]  PT Acc Code (Do Not Modify): Observation [10022]       B Medical/Surgery History Past Medical History:  Diagnosis Date  . Arthritis   . Depression   . Diabetes mellitus   . Hypertension    Past Surgical History:  Procedure Laterality Date  . ANKLE SURGERY     left ankle - was broken     A IV Location/Drains/Wounds Patient Lines/Drains/Airways Status   Active Line/Drains/Airways    None          Intake/Output Last 24 hours No intake or output data in the 24 hours ending 12/13/18 2017  Labs/Imaging Results for orders placed or performed during the hospital encounter of 12/13/18 (from the past 48 hour(s))  Basic metabolic panel     Status: Abnormal   Collection Time: 12/13/18  2:24 PM  Result Value Ref Range   Sodium 135 135 - 145 mmol/L   Potassium 3.7 3.5 - 5.1 mmol/L   Chloride 95 (L) 98 -  111 mmol/L   CO2 25 22 - 32 mmol/L   Glucose, Bld 260 (H) 70 - 99 mg/dL   BUN 15 6 - 20 mg/dL   Creatinine, Ser 1.28 (H) 0.44 - 1.00 mg/dL   Calcium 9.2 8.9 - 10.3 mg/dL   GFR calc non Af Amer 47 (L) >60 mL/min   GFR calc Af Amer 54 (L) >60 mL/min   Anion gap 15 5 - 15    Comment: Performed at [facility], [place] [address], [place], [place] [address]  CBC     Status: Abnormal   Collection Time: 12/13/18  2:24 PM  Result Value Ref Range   WBC 7.0 4.0 - 10.5 K/uL   RBC 5.30 (H) 3.87 - 5.11 MIL/uL   Hemoglobin 14.8 12.0 - 15.0 g/dL   HCT 45.3 36.0 - 46.0 %   MCV 85.5 80.0 - 100.0 fL   MCH 27.9 26.0 - 34.0 pg   MCHC 32.7 30.0 - 36.0 g/dL   RDW 14.0 11.5 - 15.5 %   Platelets 301 150 - 400 K/uL   nRBC 0.0 0.0 - 0.2 %    Comment: Performed at [facility], [place] [address], [place], [place] [address]  I-Stat beta hCG blood, ED     Status: None   Collection Time: 12/13/18  2:30 PM  Result Value Ref Range   I-stat hCG, quantitative <  5.0 <5 mIU/mL   Comment 3            Comment:   GEST. AGE      CONC.  (mIU/mL)   <=1 WEEK        5 - 50     2 WEEKS       50 - 500     3 WEEKS       100 - 10,000     4 WEEKS     1,000 - 30,000        FEMALE AND NON-PREGNANT FEMALE:     LESS THAN 5 mIU/mL   Troponin I - Once     Status: None   Collection Time: 12/13/18  5:40 PM  Result Value Ref Range   Troponin I <0.03 <0.03 ng/mL    Comment: Performed at [facility], [place] [address], [place], [place] [address]  Magnesium     Status: None   Collection Time: 12/13/18  5:40 PM  Result Value Ref Range   Magnesium 1.9 1.7 - 2.4 mg/dL    Comment: Performed at [facility], [place] [address] [place], [place] [address]  Urinalysis, Routine w reflex microscopic     Status: Abnormal   Collection Time: 12/13/18  6:50 PM  Result Value Ref Range   Color, Urine YELLOW YELLOW   APPearance CLEAR CLEAR   Specific Gravity, Urine 1.010 1.005 - 1.030   pH 7.0 5.0 - 8.0   Glucose, UA NEGATIVE  NEGATIVE mg/dL   Hgb urine dipstick NEGATIVE NEGATIVE   Bilirubin Urine NEGATIVE NEGATIVE   Ketones, ur NEGATIVE NEGATIVE mg/dL   Protein, ur NEGATIVE NEGATIVE mg/dL   Nitrite NEGATIVE NEGATIVE   Leukocytes,Ua SMALL (A) NEGATIVE    Comment: Performed at [facility] [address] [place], [place] [address]  Urinalysis, Microscopic (reflex)     Status: Abnormal   Collection Time: 12/13/18  6:50 PM  Result Value Ref Range   RBC / HPF NONE SEEN 0 - 5 RBC/hpf   WBC, UA 0-5 0 - 5 WBC/hpf   Bacteria, UA RARE (A) NONE SEEN   Squamous Epithelial / LPF NONE SEEN 0 - 5   Urine-Other LESS THAN 10 mL OF URINE SUBMITTED     Comment: Performed at [facility], [place] [address], [place], [place] [address]   Dg Chest Portable 1 View  Result Date: 12/13/2018 CLINICAL DATA:  Chest pain EXAM: PORTABLE CHEST 1 VIEW COMPARISON:  08/09/2017 FINDINGS: Patient rotated minimally left. Midline trachea. Normal heart size. Atherosclerosis in the transverse aorta. No pleural effusion or pneumothorax. Mild right hemidiaphragm elevation or eventration. Clear lungs. Numerous leads and wires project over the chest. IMPRESSION: No acute cardiopulmonary disease. Aortic Atherosclerosis (ICD10-I70.0). Electronically Signed   By: Abigail Miyamoto M.D.   On: 12/13/2018 18:22    Pending Labs Unresulted Labs (From admission, onward)    Start     Ordered   12/14/18 0500  CBC  Tomorrow morning,   R     12/13/18 2006   12/14/18 0500  Comprehensive metabolic panel  Tomorrow morning,   R     12/13/18 2006   12/14/18 0500  Hemoglobin A1c  Tomorrow morning,   R     12/13/18 2006   12/13/18 2008  Troponin I - Now Then Q6H  Now then every 6 hours,   R     12/13/18 2007   12/13/18 2001  HIV antibody (Routine Testing)  Once,   R     12/13/18 2006          Vitals/Pain Today's Vitals   12/13/18 1815 12/13/18 1817 12/13/18 1900 12/13/18 1930  BP:   (!) 143/99   Pulse: 79  78 83  Resp: 18   14  Temp:      TempSrc:       SpO2: 93%  95% 96%  Weight:      PainSc:  0-No pain      Isolation Precautions No active isolations  Medications Medications  enoxaparin (LOVENOX) injection 40 mg (has no administration in time range)  acetaminophen (TYLENOL) tablet 650 mg (has no administration in time range)    Or  acetaminophen (TYLENOL) suppository 650 mg (has no administration in time range)  senna-docusate (Senokot-S) tablet 1 tablet (has no administration in time range)  insulin aspart (novoLOG) injection 0-15 Units (has no administration in time range)  ondansetron (ZOFRAN-ODT) disintegrating tablet 4 mg (4 mg Oral Given 12/13/18 1424)    Mobility walks Low fall risk   Focused Assessments Cardiac Assessment Handoff:  Cardiac Rhythm: Normal sinus rhythm Lab Results  Component Value Date   TROPONINI <0.03 12/13/2018   No results found for: DDIMER Does the Patient currently have chest pain? No     R Recommendations: See Admitting Provider Note  Report given to:   Additional Notes:

## 2018-12-13 NOTE — H&P (Addendum)
Date: 12/13/2018               Patient Name:  Krista [name] MRN: [id number]  DOB: 05-06-1961 Age / Sex: 58 y.o., female   PCP: Krista Mainland, FNP         Medical Service: Internal Medicine Teaching Service         Attending Physician: Dr. Sid Falcon, MD    First Contact: Dr. Bobbe [name] Pager: [phone]  Second Contact: Dr. Vickki [name] Pager: [phone]       After Hours (After 5p/  First Contact Pager: [phone]  weekends / holidays): Second Contact Pager: [phone]   Chief Complaint: lightheadedness and diaphoresis  History of Present Illness:   Krista [name] is a 58 y.o female with hypertension, tobacco use, and DM2 who presents with lightheadedness, and diaphoresis. She was riding in the car with her mother earlier this afternoon when she abruptly started having lightheadedness and diaphoresis with associated shortness of breath and palpitations (approximately 5 mins). She also began to "see white spots" in her vision. She went to Krista [name] and attempted to eat something but became very nauseous and vomited once. She was then driven to the urgent care. She believes that the entire episode lasted about 30 to 45 minutes and does not have any symptoms currently (she is back to her baseline). In the urgent care she was found to have diffuse T wave inversions and prolonged QT on EKG and was sent to the ER for further evaluation.   She reports a 1-2 month history of intermittent productive cough with clear sputum. She contributes this to her allergies to pollen. She denies fevers, chills, recent travel, and sick contact. She denies chest pain. Denies abdominal pain, urinary symptoms, and changes in bowel movements.   Meds:  No outpatient medications have been marked as taking for the 12/13/18 encounter [facility] Encounter).     Allergies: Allergies as of 12/13/2018  . (No Known Allergies)   Past Medical History:  Diagnosis Date  . Arthritis   . Depression   .  Diabetes mellitus   . Hypertension     Family History:  Family History  Problem Relation Age of Onset  . Diabetes Mother   . COPD Mother   . Hypertension Mother   . Diabetes Father   . Hypertension Father     Social History: She lives with her mother. She smokes tobacco-1pack per week for the past 40 years. She drinks etoh rarely. She has smoked marijuana in the past but does not use any other drug.  Review of Systems: A complete ROS was negative except as per HPI.   Physical Exam: Blood pressure (!) 143/99, pulse 78, temperature 98.4 F (36.9 C), temperature source Oral, resp. rate 18, weight 81.6 kg, SpO2 95 %. General: Lying in bed in no acute distress HEENT: Normocephalic and atraumatic, EOMI Cardiovascular: Normal rate, regular rhythm Respiratory: Clear to auscultation bilaterally, normal work of breathing, normal oxygen saturations on room air Abdominal: Normoactive bowel sounds, soft, nontender to palpation MSK: No lower extremity edema, erythema, or warmth noted bilaterally Skin: Warm and dry Psych: Normal affect, behavior, mood  EKG: personally reviewed my interpretation is diffuse t wave inversions in I, II, aVF, V1-V6, prolonged QT  CXR: personally reviewed my interpretation is normal heart size, clear lung fields without infiltrate, no pleural effusion or pneumothorax.  Assessment & Plan by Problem: Principal Problem:   Near syncope Active Problems:   Diabetes ([place])  HTN (hypertension)   Tobacco abuse  Krista [name] is a 58 y.o female with HTN, tobacco use, and T2DM who presents after a presyncopal headache with lightheadedness, diaphoresis, SOB, and palpitations. Differential includes cardiogenic/ arrhythmic (heart score is 4) vs vasovagal vs orthostatic.  Near syncope:  - Continuous cardiac monitoring - Repeat EKG in the AM - Trend troponins - Orthostatics - Lipid panel, TSH, A1c  QT prolongation:  - Avoid QT prolonging drugs. - Repeat EKG in the AM   HTN: Previously on diltiazem. Currently on HCTZ. - Hold HCTZ  T2DM: Holding metformin - SSI TID with meals  FEN/GI: Heart healthy, carb modified DVT ppx: Lovenox CODE STATUS: Full code  Dispo: Admit patient to Observation with expected length of stay less than 2 midnights.  Signed: Carroll Sage, MD 12/13/2018, 7:32 PM  Pager: [phone]
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
## 2018-12-13 NOTE — Discharge Instructions (Signed)
D/C to ED 

## 2018-12-13 NOTE — ED Triage Notes (Addendum)
Pt in from home with c/o lightheadedness and diaphoresis that began suddenly while riding in the car. States she also had blurred vision which is now resolving. Sent down from UC. Has DM, last CBG 198. Just feels "weak", states she has been coughing since last night. Emesis x 1 in triage

## 2018-12-13 NOTE — ED Notes (Signed)
Pt requests mother to drive down to ED; pt pushed to car in wheelchair

## 2018-12-13 NOTE — ED Provider Notes (Signed)
Emergency Department Provider Note   I have reviewed the triage vital signs and the nursing notes.   HISTORY  Chief Complaint Weakness; Dizziness; and Emesis   HPI Krista [name] is a 58 y.o. female with PMH of DM, HTN, and depression emergency department with acute onset lightheadedness, diaphoresis, and blurry vision.  Patient states she was riding in the car when this began. She did feel some very mild chest tightness during the lightheadedness but that has resolved. She has been feeling generalized weakness with some coughing since the event.  She did have one episode of vomiting in triage.  She initially presented to the urgent care and was redirected to the emergency department for evaluation.  She was told at that time that her EKG was abnormal.  She denies any fevers, chills, body aches.  Prior to today's event she was feeling well. No new medications. No sick contacts.  He tells me that her symptoms have mostly resolved at this point. Denies any CP, SOB, or heart palpitations.    Past Medical History:  Diagnosis Date   Arthritis    Depression    Diabetes mellitus    Hypertension     Patient Active Problem List   Diagnosis Date Noted   Chest tightness 12/13/2018   Acute gastroenteritis 08/02/2012   Dehydration 08/02/2012   AKI (acute kidney injury) ([place]) 08/02/2012   Hypokalemia 08/02/2012   Diabetes mellitus ([place]) 08/02/2012   HTN (hypertension) 08/02/2012   Tobacco abuse 08/02/2012   ALLERGIC RHINITIS WITH CONJUNCTIVITIS 10/21/2010   ROTATOR CUFF SYNDROME, RIGHT 10/21/2010   SEBACEOUS CYST, INFECTED 01/31/2010   GUAIAC POSITIVE STOOL 10/05/2009   TOBACCO ABUSE 09/21/2009   RECTAL BLEEDING, HX OF 09/21/2009   Nonspecific (abnormal) findings on radiological and other examination of body structure 05/19/2009   RIGHT LOWER LUNG FIELD CRACKLES 05/19/2009   DIABETES MELLITUS, TYPE II 02/12/2009   Pain in limb 02/12/2009   DENTAL CARIES  04/21/2008   HYPERTENSION 05/01/2007    Past Surgical History:  Procedure Laterality Date   ANKLE SURGERY     left ankle - was broken    Allergies Patient has no known allergies.  Family History  Problem Relation Age of Onset   Diabetes Mother    COPD Mother    Hypertension Mother    Diabetes Father    Hypertension Father     Social History Social History   Tobacco Use   Smoking status: Current Every Day Smoker    Packs/day: 0.50    Years: 33.00    Pack years: 16.50    Types: Cigarettes   Smokeless tobacco: Never Used  Substance Use Topics   Alcohol use: No   Drug use: No    Review of Systems  Constitutional: No fever/chills. Positive lightheadedness.  Eyes: No visual changes. ENT: No sore throat. Cardiovascular: Positive brief chest pain and near syncope.  Respiratory: Denies shortness of breath. Gastrointestinal: No abdominal pain. Positive nausea/vomiting.  No diarrhea.  No constipation. Genitourinary: Negative for dysuria. Musculoskeletal: Negative for back pain. Skin: Negative for rash. Neurological: Negative for headaches, focal weakness or numbness.  10-point ROS otherwise negative.  ____________________________________________   PHYSICAL EXAM:  VITAL SIGNS: ED Triage Vitals  Enc Vitals Group     BP 12/13/18 1408 133/87     Pulse Rate 12/13/18 1408 76     Resp 12/13/18 1408 18     Temp 12/13/18 1408 98.4 F (36.9 C)     Temp Source 12/13/18 1408 Oral  SpO2 12/13/18 1408 97 %     Weight 12/13/18 1414 179 lb 14.3 oz (81.6 kg)     Pain Score 12/13/18 1414 0   Constitutional: Alert and oriented. Well appearing and in no acute distress. Eyes: Conjunctivae are normal.  Head: Atraumatic. Nose: No congestion/rhinnorhea. Mouth/Throat: Mucous membranes are moist.  Neck: No stridor.   Cardiovascular: Normal rate, regular rhythm. Good peripheral circulation. Grossly normal heart sounds.   Respiratory: Normal respiratory effort.  No  retractions. Lungs CTAB. Gastrointestinal: Soft and nontender. No distention.  Musculoskeletal: No lower extremity tenderness nor edema. No gross deformities of extremities. Neurologic:  Normal speech and language. No gross focal neurologic deficits are appreciated.  Skin:  Skin is warm, dry and intact. No rash noted.  ____________________________________________   LABS (all labs ordered are listed, but only abnormal results are displayed)  Labs Reviewed  BASIC METABOLIC PANEL - Abnormal; Notable for the following components:      Result Value   Chloride 95 (*)    Glucose, Bld 260 (*)    Creatinine, Ser 1.28 (*)    GFR calc non Af Amer 47 (*)    GFR calc Af Amer 54 (*)    All other components within normal limits  CBC - Abnormal; Notable for the following components:   RBC 5.30 (*)    All other components within normal limits  URINALYSIS, ROUTINE W REFLEX MICROSCOPIC - Abnormal; Notable for the following components:   Leukocytes,Ua SMALL (*)    All other components within normal limits  URINALYSIS, MICROSCOPIC (REFLEX) - Abnormal; Notable for the following components:   Bacteria, UA RARE (*)    All other components within normal limits  TROPONIN I  MAGNESIUM  HIV ANTIBODY (ROUTINE TESTING W REFLEX)  CBC  COMPREHENSIVE METABOLIC PANEL  HEMOGLOBIN A1C  TROPONIN I  TROPONIN I  TROPONIN I  CBG MONITORING, ED  I-STAT BETA HCG BLOOD, ED ([facility], [facility], [facility] ONLY)   ____________________________________________  EKG   EKG Interpretation  Date/Time:  Friday Dec 13 2018 14:02:11 EDT Ventricular Rate:  80 PR Interval:  166 QRS Duration: 92 QT Interval:  482 QTC Calculation: 555 R Axis:   -65 Text Interpretation:  Normal sinus rhythm Possible Left atrial enlargement Left anterior fascicular block Left ventricular hypertrophy Cannot rule out Inferior infarct (masked by fascicular block?) , age undetermined Anterior infarct , age undetermined T wave abnormality, consider lateral  ischemia Prolonged QT Abnormal ECG No STEMI. Lateral T wave changes  new from prior.  Confirmed by Nanda Quinton [id number]) on 12/13/2018 4:51:42 PM       ____________________________________________  RADIOLOGY  Dg Chest Portable 1 View  Result Date: 12/13/2018 CLINICAL DATA:  Chest pain EXAM: PORTABLE CHEST 1 VIEW COMPARISON:  08/09/2017 FINDINGS: Patient rotated minimally left. Midline trachea. Normal heart size. Atherosclerosis in the transverse aorta. No pleural effusion or pneumothorax. Mild right hemidiaphragm elevation or eventration. Clear lungs. Numerous leads and wires project over the chest. IMPRESSION: No acute cardiopulmonary disease. Aortic Atherosclerosis (ICD10-I70.0). Electronically Signed   By: Abigail Miyamoto M.D.   On: 12/13/2018 18:22    ____________________________________________   PROCEDURES  Procedure(s) performed:   Procedures  None ____________________________________________   INITIAL IMPRESSION / ASSESSMENT AND PLAN / ED COURSE  Pertinent labs & imaging results that were available during my care of the patient were reviewed by me and considered in my medical decision making (see chart for details).   Patient presents to the emergency department for evaluation of acute onset lightheadedness  with diaphoresis, nausea.  Patient has since had one episode of vomiting.  She did develop some mild chest tightness during the event.  Symptoms have mostly resolved.  Her EKG does not show evidence of acute ischemia but does show a prolonged QT.  Patient was given Zofran for nausea from triage by nursing protocol with active vomiting on arrival. I do not believe that the EKG and QT being prolonged was known at that time in triage. Will continue to monitor.  In addition to the prolonged QT the patient does have some inverted T waves laterally which did not appear in the 2018 tracing went to the last see in epic. Differential includes acute arrhythmia, ACS, PE. Very low suspicion  for infectious process/COVID.   Labs resulted with no significant electrolyte abnormalities. Hyperglycemia noted without DKA. CXR without acute process. Troponin is negative. Considered PE but feel this is less likely with no vital sign abnormalities, CP, or SOB symptoms. Plan to continue monitoring overnight and trend troponin.   Discussed patient's case with Medicine to request admission. Patient and family (if present) updated with plan. Care transferred to Medicine service.  I reviewed all nursing notes, vitals, pertinent old records, EKGs, labs, imaging (as available).  ____________________________________________  FINAL CLINICAL IMPRESSION(S) / ED DIAGNOSES  Final diagnoses:  Near syncope  Prolonged Q-T interval on ECG     MEDICATIONS GIVEN DURING THIS VISIT:  Medications  enoxaparin (LOVENOX) injection 40 mg (has no administration in time range)  acetaminophen (TYLENOL) tablet 650 mg (has no administration in time range)    Or  acetaminophen (TYLENOL) suppository 650 mg (has no administration in time range)  senna-docusate (Senokot-S) tablet 1 tablet (has no administration in time range)  insulin aspart (novoLOG) injection 0-15 Units (has no administration in time range)  ondansetron (ZOFRAN-ODT) disintegrating tablet 4 mg (4 mg Oral Given 12/13/18 1424)     Note:  This document was prepared using Dragon voice recognition software and may include unintentional dictation errors.  Nanda Quinton, MD Emergency Medicine    Laberta Wilbon, [facility], MD 12/13/18 2052

## 2018-12-13 NOTE — ED Provider Notes (Addendum)
  Patient briefly evaluated after triage  58 year old female history of hypertension, tobacco use, DM type II, presenting to urgent care for sensation of weakness.  Patient states that earlier today she was in the car and ever came with a sensation of feeling hot and with white vision/blurry vision.  Her mother brought her here to be evaluated.  Denies any chest pain or shortness of breath.  Sensations of a presyncopal episode.  Denies syncope.  Vital signs stable in clinic although blood pressure lower than her typical hypertensive range. Today's Vitals   12/13/18 1321  BP: 109/77  Pulse: 78  Resp: 18  Temp: 97.8 F (36.6 C)  TempSrc: Oral  SpO2: 100%  PainSc: 0-No pain   There is no height or weight on file to calculate BMI. Physical Exam  Constitutional: She is oriented to person, place, and time.  Sitting on exam table, appears slightly uncomfortable, but no acute distress  HENT:  Head: Normocephalic and atraumatic.  Eyes: Pupils are equal, round, and reactive to light.  Neck: Neck supple.  Cardiovascular: Normal rate.  Pulmonary/Chest: Effort normal. No respiratory distress.  Neurological: She is alert and oriented to person, place, and time. GCS score is 15.     Blood sugar 195 EKG normal sinus rhythm with diffuse T wave inversions as well as prolonged QT; no ST elevations  Based off this was recommended to further evaluation and work-up in emergency room.  Patient was stable on discharge, transported with mother to ED and  [place] contacted charge nurse.    Janith Lima, PA-C 12/13/18 1349    Wieters, Dawson C, PA-C 12/13/18 1352

## 2018-12-13 NOTE — ED Notes (Signed)
Pt provided with turkey sandwich and diet ginger ale 

## 2018-12-14 ENCOUNTER — Observation Stay (HOSPITAL_BASED_OUTPATIENT_CLINIC_OR_DEPARTMENT_OTHER): Payer: Medicaid Other

## 2018-12-14 DIAGNOSIS — R9431 Abnormal electrocardiogram [ECG] [EKG]: Secondary | ICD-10-CM

## 2018-12-14 DIAGNOSIS — Z833 Family history of diabetes mellitus: Secondary | ICD-10-CM | POA: Diagnosis not present

## 2018-12-14 DIAGNOSIS — Z825 Family history of asthma and other chronic lower respiratory diseases: Secondary | ICD-10-CM | POA: Diagnosis not present

## 2018-12-14 DIAGNOSIS — N179 Acute kidney failure, unspecified: Secondary | ICD-10-CM

## 2018-12-14 DIAGNOSIS — Z79899 Other long term (current) drug therapy: Secondary | ICD-10-CM | POA: Diagnosis not present

## 2018-12-14 DIAGNOSIS — Z7984 Long term (current) use of oral hypoglycemic drugs: Secondary | ICD-10-CM | POA: Diagnosis not present

## 2018-12-14 DIAGNOSIS — Z8249 Family history of ischemic heart disease and other diseases of the circulatory system: Secondary | ICD-10-CM | POA: Diagnosis not present

## 2018-12-14 DIAGNOSIS — I10 Essential (primary) hypertension: Secondary | ICD-10-CM

## 2018-12-14 DIAGNOSIS — R0789 Other chest pain: Secondary | ICD-10-CM | POA: Diagnosis present

## 2018-12-14 DIAGNOSIS — R531 Weakness: Secondary | ICD-10-CM | POA: Diagnosis present

## 2018-12-14 DIAGNOSIS — I25110 Atherosclerotic heart disease of native coronary artery with unstable angina pectoris: Secondary | ICD-10-CM | POA: Diagnosis not present

## 2018-12-14 DIAGNOSIS — E119 Type 2 diabetes mellitus without complications: Secondary | ICD-10-CM | POA: Diagnosis present

## 2018-12-14 DIAGNOSIS — H538 Other visual disturbances: Secondary | ICD-10-CM | POA: Diagnosis present

## 2018-12-14 DIAGNOSIS — I440 Atrioventricular block, first degree: Secondary | ICD-10-CM | POA: Diagnosis present

## 2018-12-14 DIAGNOSIS — I951 Orthostatic hypotension: Secondary | ICD-10-CM | POA: Diagnosis not present

## 2018-12-14 DIAGNOSIS — E785 Hyperlipidemia, unspecified: Secondary | ICD-10-CM | POA: Diagnosis present

## 2018-12-14 DIAGNOSIS — Z23 Encounter for immunization: Secondary | ICD-10-CM | POA: Diagnosis not present

## 2018-12-14 DIAGNOSIS — R55 Syncope and collapse: Secondary | ICD-10-CM | POA: Diagnosis present

## 2018-12-14 DIAGNOSIS — E860 Dehydration: Secondary | ICD-10-CM

## 2018-12-14 DIAGNOSIS — F17210 Nicotine dependence, cigarettes, uncomplicated: Secondary | ICD-10-CM | POA: Diagnosis present

## 2018-12-14 LAB — CBC
HCT: 42.5 % (ref 36.0–46.0)
Hemoglobin: 14.3 g/dL (ref 12.0–15.0)
MCH: 28.1 pg (ref 26.0–34.0)
MCHC: 33.6 g/dL (ref 30.0–36.0)
MCV: 83.7 fL (ref 80.0–100.0)
Platelets: 312 10*3/uL (ref 150–400)
RBC: 5.08 MIL/uL (ref 3.87–5.11)
RDW: 14 % (ref 11.5–15.5)
WBC: 7.4 10*3/uL (ref 4.0–10.5)
nRBC: 0 % (ref 0.0–0.2)

## 2018-12-14 LAB — TSH: TSH: 1.149 u[IU]/mL (ref 0.350–4.500)

## 2018-12-14 LAB — LIPID PANEL
Cholesterol: 167 mg/dL (ref 0–200)
HDL: 41 mg/dL (ref >40)
LDL Cholesterol: 112 mg/dL — ABNORMAL HIGH (ref 0–99)
Total CHOL/HDL Ratio: 4.1 RATIO
Triglycerides: 72 mg/dL (ref <150)
VLDL: 14 mg/dL (ref 0–40)

## 2018-12-14 LAB — ECHOCARDIOGRAM COMPLETE
Height: 63 in
Weight: 2768.98 oz

## 2018-12-14 LAB — COMPREHENSIVE METABOLIC PANEL
ALT: 10 U/L (ref 0–44)
AST: 13 U/L — ABNORMAL LOW (ref 15–41)
Albumin: 3.3 g/dL — ABNORMAL LOW (ref 3.5–5.0)
Alkaline Phosphatase: 51 U/L (ref 38–126)
Anion gap: 11 (ref 5–15)
BUN: 16 mg/dL (ref 6–20)
CO2: 28 mmol/L (ref 22–32)
Calcium: 9.3 mg/dL (ref 8.9–10.3)
Chloride: 100 mmol/L (ref 98–111)
Creatinine, Ser: 1.11 mg/dL — ABNORMAL HIGH (ref 0.44–1.00)
GFR calc Af Amer: >60 mL/min (ref >60)
GFR calc non Af Amer: 56 mL/min — ABNORMAL LOW (ref >60)
Glucose, Bld: 136 mg/dL — ABNORMAL HIGH (ref 70–99)
Potassium: 3.9 mmol/L (ref 3.5–5.1)
Sodium: 139 mmol/L (ref 135–145)
Total Bilirubin: 0.9 mg/dL (ref 0.3–1.2)
Total Protein: 6.9 g/dL (ref 6.5–8.1)

## 2018-12-14 LAB — GLUCOSE, CAPILLARY
Glucose-Capillary: 148 mg/dL — ABNORMAL HIGH (ref 70–99)
Glucose-Capillary: 146 mg/dL — ABNORMAL HIGH (ref 70–99)
Glucose-Capillary: 88 mg/dL (ref 70–99)
Glucose-Capillary: 146 mg/dL — ABNORMAL HIGH (ref 70–99)
Glucose-Capillary: 118 mg/dL — ABNORMAL HIGH (ref 70–99)
Glucose-Capillary: 212 mg/dL — ABNORMAL HIGH (ref 70–99)

## 2018-12-14 LAB — HIV ANTIBODY (ROUTINE TESTING W REFLEX): HIV Screen 4th Generation wRfx: NONREACTIVE

## 2018-12-14 LAB — HEMOGLOBIN A1C
Hgb A1c MFr Bld: 7.3 % — ABNORMAL HIGH (ref 4.8–5.6)
Mean Plasma Glucose: 162.81 mg/dL

## 2018-12-14 LAB — TROPONIN I
Troponin I: 0.05 ng/mL (ref <0.03)
Troponin I: 0.03 ng/mL (ref <0.03)

## 2018-12-14 MED ORDER — PNEUMOCOCCAL VAC POLYVALENT 25 MCG/0.5ML IJ INJ
0.5000 mL | INJECTION | INTRAMUSCULAR | Status: AC
  Administered 2018-12-17: 0.5 mL via INTRAMUSCULAR
  Filled 2018-12-14: qty 0.5

## 2018-12-14 MED ORDER — SODIUM CHLORIDE 0.9 % IV SOLN
INTRAVENOUS | Status: DC
  Administered 2018-12-14: 23:00:00 via INTRAVENOUS

## 2018-12-14 MED ORDER — ATORVASTATIN CALCIUM 40 MG PO TABS
40.0000 mg | ORAL_TABLET | Freq: Every day | ORAL | Status: DC
  Administered 2018-12-14 – 2018-12-15 (×2): 40 mg via ORAL
  Filled 2018-12-14 (×2): qty 1

## 2018-12-14 NOTE — Progress Notes (Signed)
*  PRELIMINARY RESULTS* Echocardiogram 2D Echocardiogram has been performed.  Krista [name] 12/14/2018, 9:15 AM

## 2018-12-14 NOTE — Consult Note (Signed)
Cardiology Consultation:   Patient ID: Krista [name] MRN: [id number]; DOB: Jan 15, 1961  Admit date: 12/13/2018 Date of Consult: 12/14/2018  Primary Care Provider: Mardi Mainland, [place] Primary Cardiologist: New  Patient Profile:   Krista [name] is a 58 y.o. female with a hx of who is being seen today for the evaluation of  at the request of   History of Present Illness:   Krista [name] is a a 58 yo with hx of HTN, tobacco abuse, DM.  Yesterday hadnt had breakfast or lunch   Was going to [place] to get something to eat around 1:30   Was riding in car  when she developed dizziness, SOB and palpitations  Came on at same time   Never had before   No CP  Broke into a sweat     Lost vision but did not pass out     Went in to [place] to eat  Still dizzy and feeling  bad at time  Ate some and then became very nauseated.   Vomited once  Taken to urgent care  At urgent care still felt bad    Took BP and EKG and sent to [facility]   In ED pt says she was still feeling bad  Dizzy, felt like something in throat  Couldn't get out  Felt something in chest.  Again, couldn't get out  Went away finally    Today feels fine, ready to go home  PT is not that active   Says she aches all over. Denies CP   No PND  No syncope Has allergies takes Clariin     Since admit, orthostatics done( neg, noted below) Trop:  <0.03, 0.03, 0.05    Past Medical History:  Diagnosis Date  . Arthritis   . Depression   . Diabetes mellitus   . Hypertension     Past Surgical History:  Procedure Laterality Date  . ANKLE SURGERY     left ankle - was broken     Home meds Hydrochlorothiazide Claritin Metformin  Inpatient Medications: Scheduled Meds: . enoxaparin (LOVENOX) injection  40 mg Subcutaneous Q24H  . insulin aspart  0-15 Units Subcutaneous TID WC  . loratadine  10 mg Oral Daily  . [START ON 12/15/2018] pneumococcal 23 valent vaccine  0.5 mL Intramuscular Tomorrow-1000   Continuous Infusions:    Allergies:   No Known Allergies  Social History:   Social History   Socioeconomic History  . Marital status: Single    Spouse name: Not on file  . Number of children: Not on file  . Years of education: Not on file  . Highest education level: Not on file  Occupational History  . Not on file  Social Needs  . Financial resource strain: Not on file  . Food insecurity:    Worry: Not on file    Inability: Not on file  . Transportation needs:    Medical: Not on file    Non-medical: Not on file  Tobacco Use  . Smoking status: Current Every Day Smoker    Packs/day: 0.50    Years: 33.00    Pack years: 16.50    Types: Cigarettes  . Smokeless tobacco: Never Used  Substance and Sexual Activity  . Alcohol use: No  . Drug use: No  . Sexual activity: Yes    Birth control/protection: Post-menopausal  Lifestyle  . Physical activity:    Days per week: Not on file    Minutes per session: Not on file  .  Stress: Not on file  Relationships  . Social connections:    Talks on phone: Not on file    Gets together: Not on file    Attends religious service: Not on file    Active member of club or organization: Not on file    Attends meetings of clubs or organizations: Not on file    Relationship status: Not on file  . Intimate partner violence:    Fear of current or ex partner: Not on file    Emotionally abused: Not on file    Physically abused: Not on file    Forced sexual activity: Not on file  Other Topics Concern  . Not on file  Social History Narrative  . Not on file    Family History:    Family History  Problem Relation Age of Onset  . Diabetes Mother   . COPD Mother   . Hypertension Mother   . Diabetes Father   . Hypertension Father    father had ischemic CM, CAD and an ICD. Mother also had ischemic CM She is unaware of arrhythmias or sudden death history  ROS:  Please see the history of present illness.  All other ROS reviewed and negative.     Physical Exam/Data:    Vitals:   12/14/18 0026 12/14/18 0027 12/14/18 0030 12/14/18 0408  BP: 113/83 118/90 122/89 (!) 128/91  Pulse: 88 (!) 101 (!) 101 93  Resp:      Temp:    (!) 97.5 F (36.4 C)  TempSrc:    Oral  SpO2: 96% 96% 98% 98%  Weight:    78.5 kg  Height:        Othostatic VS 12/13/18  Laying:  107/78  P 88   Sitting   113/83  P 88  Standing 122/89  P 101    No intake or output data in the 24 hours ending 12/14/18 0732 Last 3 Weights 12/14/2018 12/13/2018 12/13/2018  Weight (lbs) 173 lb 1 oz 173 lb 1 oz 179 lb 14.3 oz  Weight (kg) 78.5 kg 78.5 kg 81.6 kg     Body mass index is 30.66 kg/m.  General:  Well nourished, well developed, in no acute distress HEENT: normal Lymph: no adenopathy Neck: no JVD Cardiac:  RRR Lungs:  clear to auscultation bilaterally, no wheezing, rhonchi or rales  Abd: soft, nontender, no hepatomegaly  Ext: no edema Musculoskeletal:  No deformities, BUE and BLE strength normal and equal Skin: warm and dry  Neuro:  CNs 2-12 intact, no focal abnormalities noted Psych:  Normal affect   EKG:  The EKG was personally reviewed and demonstrates:  SR  Q T prolongation 580 msec Anterior MI   Possible inferior MI   T wave inversion inferior and lateral leads  (Q waves, T wave changes, new from EKG of 2018)   Telemetry:  Telemetry was personally reviewed and demonstrates:  Sinus rhythm, rare PVCs  Relevant CV Studies: None  Laboratory Data:  Chemistry Recent Labs  Lab 12/13/18 1424 12/14/18 0134  NA 135 139  K 3.7 3.9  CL 95* 100  CO2 25 28  GLUCOSE 260* 136*  BUN 15 16  CREATININE 1.28* 1.11*  CALCIUM 9.2 9.3  GFRNONAA 47* 56*  GFRAA 54* >60  ANIONGAP 15 11    Recent Labs  Lab 12/14/18 0134  PROT 6.9  ALBUMIN 3.3*  AST 13*  ALT 10  ALKPHOS 51  BILITOT 0.9   Hematology Recent Labs  Lab 12/13/18 1424  12/14/18 0134  WBC 7.0 7.4  RBC 5.30* 5.08  HGB 14.8 14.3  HCT 45.3 42.5  MCV 85.5 83.7  MCH 27.9 28.1  MCHC 32.7 33.6  RDW 14.0 14.0  PLT  301 312   Cardiac Enzymes Recent Labs  Lab 12/13/18 1740 12/13/18 2016 12/14/18 0134  TROPONINI <0.03 0.03* 0.05*   No results for input(s): TROPIPOC in the last 168 hours.  BNPNo results for input(s): BNP, PROBNP in the last 168 hours.  DDimer No results for input(s): DDIMER in the last 168 hours.  Radiology/Studies:  Dg Chest Portable 1 View  Result Date: 12/13/2018 CLINICAL DATA:  Chest pain EXAM: PORTABLE CHEST 1 VIEW COMPARISON:  08/09/2017 FINDINGS: Patient rotated minimally left. Midline trachea. Normal heart size. Atherosclerosis in the transverse aorta. No pleural effusion or pneumothorax. Mild right hemidiaphragm elevation or eventration. Clear lungs. Numerous leads and wires project over the chest. IMPRESSION: No acute cardiopulmonary disease. Aortic Atherosclerosis (ICD10-I70.0). Electronically Signed   By: Abigail Miyamoto M.D.   On: 12/13/2018 18:22    Assessment and Plan:   1. Dizziness/ Near syncope Unclear etiology History could suggest vagal or arrhythmogenic cause. Her ekg is quite abnormal and she does have inferior wall motion abnormality. I would advise cath as the next step.  We will plan for cath on Monday. No driving x 6 months with near syncope (Pt aware)  2. HTN Stable No change required today  3.  Hx atherosclerosis/ HL Will need statin  4. DM  Hgb A1C 7.3   Medical management   For questions or updates, please contact [place] Please consult [email or web address] for contact info under   Thompson Grayer MD, [place] 12/14/2018 11:19 AM

## 2018-12-14 NOTE — Progress Notes (Addendum)
   Subjective: Patient was seen and evaluated at bedside on morning rounds. She denies chest pain, shortness breath, nausea or vomiting. We discussed her blood test (Troponon) results and EKG changes. And that cardiology will see her as we are concerns her symptoms were due to heart problem. She agrees with the plan.  Objective:  Vital signs in last 24 hours: Vitals:   12/14/18 0026 12/14/18 0027 12/14/18 0030 12/14/18 0408  BP: 113/83 118/90 122/89 (!) 128/91  Pulse: 88 (!) 101 (!) 101 93  Resp:      Temp:    (!) 97.5 F (36.4 C)  TempSrc:    Oral  SpO2: 96% 96% 98% 98%  Weight:    78.5 kg  Height:       Physical Exam:  VS reviewed, nursing notes reviewed. General: Pleasant lady, lying in bed in no acute distress CV: RRR, normal S1-S2, no murmur Pulm: No respiratory distress, CTA bilaterally Abdomen: Is soft and nontender to palpation, BS are present Musculoskeletal: No lower extremity edema, pulses are normal Neurologic exam: Alert and oriented x3 Psychiatric exam: Normal mood and affect   Assessment/Plan:  Principal Problem:   Near syncope Active Problems:   Diabetes ([name])   HTN (hypertension)   Tobacco abuse   Prolonged Q-T interval on ECG  Krista [name] is a 58 y.o female with HTN, tobacco use, and T2DM who presents after a presyncopal headache with lightheadedness, diaphoresis, SOB, and palpitations. Differential includes cardiogenic/ arrhythmic (heart score is 4) vs vasovagal vs orthostatic.   Presyncopal episode: Concerning for inferior/lateral NSTEMI: Patient presented with lightheadedness and sweating, and vomiting. Can be Vasovagal, but concerning for to EKG abnormalities and Troponin changes as with being female with DM, has more risk for silent and atypical acute cardiac events. No PMhx of cardiac disease. She is diabetic with HbA1c 7.3. Is not on Statin.  EKG with T inversion in anterior/inferior leads, and mildly elevated troponin that trended up  initially.    Cardiology consulted and saw the patient. Performed Echo and planned for Cath on Monday. She is currently asymptomatic. TSH--> Normal Lipid Panel: Total Chol 167, LDL 112, HDL: 41  -F/u echo result -Cardiology is on board, planned for Cath on Monday. We will follow recommendation.  -Continue cardiac telemetry -No driving for 6 months  QT prolongation: -Avoid QT prolonging patient's  AKI:  Cr on arrival 1.28-->1.11 Likely prerenal and due to dehydration. Baseline Cr likely around 1  -BMP daily -Holding HCTZ  HTN: She used to be on Diltiazem. She is now on HCTZ at home, holding due to possibility of orthostatic as well as AKI - Hold HCTZ - Monitor BP  T2DM: On Metformin at home. Hb A1c: 7.3 - Holding metformin - SSI TID with meals - Satin at Dc  Diet: HH, carb modified IV fluid: none VTE ppx: lovenox Code status: full  Dispo: Anticipated discharge in 2-3 days  Dewayne Hatch, MD 12/14/2018, 11:25 AM Pager: [phone]

## 2018-12-15 LAB — CBC
HCT: 40.5 % (ref 36.0–46.0)
Hemoglobin: 13.5 g/dL (ref 12.0–15.0)
MCH: 28 pg (ref 26.0–34.0)
MCHC: 33.3 g/dL (ref 30.0–36.0)
MCV: 83.9 fL (ref 80.0–100.0)
Platelets: 286 10*3/uL (ref 150–400)
RBC: 4.83 MIL/uL (ref 3.87–5.11)
RDW: 13.8 % (ref 11.5–15.5)
WBC: 6.3 10*3/uL (ref 4.0–10.5)
nRBC: 0 % (ref 0.0–0.2)

## 2018-12-15 LAB — GLUCOSE, CAPILLARY
Glucose-Capillary: 136 mg/dL — ABNORMAL HIGH (ref 70–99)
Glucose-Capillary: 105 mg/dL — ABNORMAL HIGH (ref 70–99)
Glucose-Capillary: 165 mg/dL — ABNORMAL HIGH (ref 70–99)
Glucose-Capillary: 115 mg/dL — ABNORMAL HIGH (ref 70–99)

## 2018-12-15 LAB — BASIC METABOLIC PANEL
Anion gap: 12 (ref 5–15)
BUN: 19 mg/dL (ref 6–20)
CO2: 25 mmol/L (ref 22–32)
Calcium: 8.8 mg/dL — ABNORMAL LOW (ref 8.9–10.3)
Chloride: 101 mmol/L (ref 98–111)
Creatinine, Ser: 0.9 mg/dL (ref 0.44–1.00)
GFR calc Af Amer: >60 mL/min (ref >60)
GFR calc non Af Amer: >60 mL/min (ref >60)
Glucose, Bld: 164 mg/dL — ABNORMAL HIGH (ref 70–99)
Potassium: 3.5 mmol/L (ref 3.5–5.1)
Sodium: 138 mmol/L (ref 135–145)

## 2018-12-15 MED ORDER — NITROGLYCERIN 0.4 MG SL SUBL
SUBLINGUAL_TABLET | SUBLINGUAL | Status: AC
  Administered 2018-12-15: 0.4 mg
  Filled 2018-12-15: qty 1

## 2018-12-15 MED ORDER — SODIUM CHLORIDE 0.9% FLUSH: 3.0000 mL | INTRAVENOUS | Status: DC | PRN

## 2018-12-15 MED ORDER — SODIUM CHLORIDE 0.9 % WEIGHT BASED INFUSION
1.0000 mL/kg/h | INTRAVENOUS | Status: DC
  Administered 2018-12-15: 1 mL/kg/h via INTRAVENOUS

## 2018-12-15 MED ORDER — NITROGLYCERIN 0.4 MG SL SUBL
SUBLINGUAL_TABLET | SUBLINGUAL | Status: AC
  Administered 2018-12-15: 0.4 mg via SUBLINGUAL
  Filled 2018-12-15: qty 1

## 2018-12-15 MED ORDER — NITROGLYCERIN 0.4 MG SL SUBL
0.4000 mg | SUBLINGUAL_TABLET | SUBLINGUAL | Status: DC | PRN
  Administered 2018-12-15 (×2): 0.4 mg via SUBLINGUAL

## 2018-12-15 MED ORDER — POTASSIUM CHLORIDE 2 MEQ/ML IV SOLN
INTRAVENOUS | Status: AC
  Filled 2018-12-15: qty 1000

## 2018-12-15 MED ORDER — SODIUM CHLORIDE 0.9 % IV SOLN: 250.0000 mL | INTRAVENOUS | Status: DC | PRN

## 2018-12-15 MED ORDER — SODIUM CHLORIDE 0.9% FLUSH
3.0000 mL | Freq: Two times a day (BID) | INTRAVENOUS | Status: DC
  Administered 2018-12-15: 3 mL via INTRAVENOUS

## 2018-12-15 MED ORDER — NICOTINE 7 MG/24HR TD PT24
7.0000 mg | MEDICATED_PATCH | Freq: Every day | TRANSDERMAL | Status: DC
  Administered 2018-12-15 – 2018-12-17 (×3): 7 mg via TRANSDERMAL
  Filled 2018-12-15 (×4): qty 1

## 2018-12-15 MED ORDER — SODIUM CHLORIDE 0.9 % IV SOLN: INTRAVENOUS | Status: DC

## 2018-12-15 NOTE — Progress Notes (Signed)
Progress Note   Subjective   Doing well today, the patient denies CP or SOB.  No new concerns  Inpatient Medications    Scheduled Meds: . atorvastatin  40 mg Oral q1800  . enoxaparin (LOVENOX) injection  40 mg Subcutaneous Q24H  . insulin aspart  0-15 Units Subcutaneous TID WC  . loratadine  10 mg Oral Daily  . pneumococcal 23 valent vaccine  0.5 mL Intramuscular Tomorrow-1000   Continuous Infusions: . lactated ringers with kcl     PRN Meds: acetaminophen **OR** acetaminophen, senna-docusate   Vital Signs    Vitals:   12/14/18 0408 12/14/18 1553 12/14/18 2052 12/15/18 0558  BP: (!) 128/91 114/69 127/85 113/74  Pulse: 93 73 82 84  Resp:  20 (!) 24 20  Temp: (!) 97.5 F (36.4 C) 98.6 F (37 C) 98.3 F (36.8 C) 98.6 F (37 C)  TempSrc: Oral Oral Oral Oral  SpO2: 98% 94% 99% 96%  Weight: 78.5 kg   80 kg  Height:        Intake/Output Summary (Last 24 hours) at 12/15/2018 0814 Last data filed at 12/15/2018 0600 Gross per 24 hour  Intake 1538.32 ml  Output 250 ml  Net 1288.32 ml   Filed Weights   12/13/18 2112 12/14/18 0408 12/15/18 0558  Weight: 78.5 kg 78.5 kg 80 kg    Telemetry    sinus - Personally Reviewed  Physical Exam   GEN- The patient is well appearing, alert and oriented x 3 today.   Head- normocephalic, atraumatic Eyes-  Sclera clear, conjunctiva pink Ears- hearing intact Oropharynx- clear Neck- supple, Lungs-  normal work of breathing Heart- Regular rate and rhythm  GI- soft, NT, ND, + BS Extremities- no clubbing, cyanosis, or edema  MS- no significant deformity or atrophy Skin- no rash or lesion Psych- euthymic mood, full affect Neuro- strength and sensation are intact   Labs    Chemistry Recent Labs  Lab 12/13/18 1424 12/14/18 0134 12/15/18 0323  NA 135 139 138  K 3.7 3.9 3.5  CL 95* 100 101  CO2 25 28 25   GLUCOSE 260* 136* 164*  BUN 15 16 19   CREATININE 1.28* 1.11* 0.90  CALCIUM 9.2 9.3 8.8*  PROT  --  6.9  --    ALBUMIN  --  3.3*  --   AST  --  13*  --   ALT  --  10  --   ALKPHOS  --  51  --   BILITOT  --  0.9  --   GFRNONAA 47* 56* >60  GFRAA 54* >60 >60  ANIONGAP 15 11 12      Hematology Recent Labs  Lab 12/13/18 1424 12/14/18 0134 12/15/18 0323  WBC 7.0 7.4 6.3  RBC 5.30* 5.08 4.83  HGB 14.8 14.3 13.5  HCT 45.3 42.5 40.5  MCV 85.5 83.7 83.9  MCH 27.9 28.1 28.0  MCHC 32.7 33.6 33.3  RDW 14.0 14.0 13.8  PLT 301 312 286    Cardiac Enzymes Recent Labs  Lab 12/13/18 1740 12/13/18 2016 12/14/18 0134 12/14/18 0745  TROPONINI <0.03 0.03* 0.05* 0.03*   No results for input(s): TROPIPOC in the last 168 hours.      Assessment & Plan    1.  Presyncope/ dizziness She had transient symptoms of unclear etiology.  Reports "I could not see" but did not have full syncope.  History is suggestive of either a vagal event or arrhythmia. ekg is markedly abnormal and wall motion abnormality also noted  on echo.  I have reviewed echo at length with Dr Harrington Challenger.  We agree that cath is indication to evaluate for ischemia as the cause for her event.  Will gently hydrate overnight. Discussed the cath with the patient. The patient understands that risks included but are not limited to stroke (1 in 1000), death (1 in 34), kidney failure [usually temporary] (1 in 500), bleeding (1 in 200), allergic reaction [possibly serious] (1 in 200). The patient understands and agrees to proceed.   NPO after midnight.  If no further arrhythmias here, I would advise 30 day monitor at discharge with follow-up in general cardiology clinic  2. HTN Stable No change required today  3. Diabetes Per primary team  Thompson Grayer MD, [facility] 12/15/2018 8:14 AM

## 2018-12-15 NOTE — Progress Notes (Signed)
Pt c/o 7/10 CP. 1114- 1 SL ntg given. 1120-CP 2/10 2nd SL ntg given.  1125 pt CP free. Continue to monitor. Carroll Kinds RN

## 2018-12-15 NOTE — Progress Notes (Signed)
   Subjective: Pt reports no cp, sob or other complaints  Objective:  Vital signs in last 24 hours: Vitals:   12/14/18 0408 12/14/18 1553 12/14/18 2052 12/15/18 0558  BP: (!) 128/91 114/69 127/85 113/74  Pulse: 93 73 82 84  Resp:  20 (!) 24 20  Temp: (!) 97.5 F (36.4 C) 98.6 F (37 C) 98.3 F (36.8 C) 98.6 F (37 C)  TempSrc: Oral Oral Oral Oral  SpO2: 98% 94% 99% 96%  Weight: 78.5 kg   80 kg  Height:       Cardiac: jvp 9 cm, normal rate and rhythm, clear s1 and s2, no murmurs, rubs or gallops Pulmonary: distant breath sounds, not in distress Abdominal: non distended abdomen, soft and nontender Extremities: no LE edema Psych: Alert, conversant, in good spirits   Assessment/Plan:  Principal Problem:   Near syncope Active Problems:   Diabetes ([name])   HTN (hypertension)   Tobacco abuse   Prolonged Q-T interval on ECG   Chest tightness  Presyncope: uncertain etiology neurocardiogenic vs cardigoenic most likely.  Ecg markedly different and wall motion abnormalities seen on ECHO.    -cath to evaluate for ischemic disease tomorrow -continue telemetry -no driving for 6 months  AKI: prerenal volume depleted.  Now resolved  -will get gentle fluids before cath, will try not to overhydrate as she appears to have begun retaining fluid  HTN: BP okay off hctz  -continue to monitor  T2DM: On Metformin at home. Hb A1c: 7.3 - Holding metformin - SSI TID with meals  HLD: in the setting of T2DM - Started atorvastatin 40mg    Dispo: Anticipated discharge pending cardiac evaluation  Katherine Roan, MD 12/15/2018, 9:26 AM

## 2018-12-15 NOTE — H&P (View-Only) (Signed)
Progress Note   Subjective   Doing well today, the patient denies CP or SOB.  No new concerns  Inpatient Medications    Scheduled Meds: . atorvastatin  40 mg Oral q1800  . enoxaparin (LOVENOX) injection  40 mg Subcutaneous Q24H  . insulin aspart  0-15 Units Subcutaneous TID WC  . loratadine  10 mg Oral Daily  . pneumococcal 23 valent vaccine  0.5 mL Intramuscular Tomorrow-1000   Continuous Infusions: . lactated ringers with kcl     PRN Meds: acetaminophen **OR** acetaminophen, senna-docusate   Vital Signs    Vitals:   12/14/18 0408 12/14/18 1553 12/14/18 2052 12/15/18 0558  BP: (!) 128/91 114/69 127/85 113/74  Pulse: 93 73 82 84  Resp:  20 (!) 24 20  Temp: (!) 97.5 F (36.4 C) 98.6 F (37 C) 98.3 F (36.8 C) 98.6 F (37 C)  TempSrc: Oral Oral Oral Oral  SpO2: 98% 94% 99% 96%  Weight: 78.5 kg   80 kg  Height:        Intake/Output Summary (Last 24 hours) at 12/15/2018 0814 Last data filed at 12/15/2018 0600 Gross per 24 hour  Intake 1538.32 ml  Output 250 ml  Net 1288.32 ml   Filed Weights   12/13/18 2112 12/14/18 0408 12/15/18 0558  Weight: 78.5 kg 78.5 kg 80 kg    Telemetry    sinus - Personally Reviewed  Physical Exam   GEN- The patient is well appearing, alert and oriented x 3 today.   Head- normocephalic, atraumatic Eyes-  Sclera clear, conjunctiva pink Ears- hearing intact Oropharynx- clear Neck- supple, Lungs-  normal work of breathing Heart- Regular rate and rhythm  GI- soft, NT, ND, + BS Extremities- no clubbing, cyanosis, or edema  MS- no significant deformity or atrophy Skin- no rash or lesion Psych- euthymic mood, full affect Neuro- strength and sensation are intact   Labs    Chemistry Recent Labs  Lab 12/13/18 1424 12/14/18 0134 12/15/18 0323  NA 135 139 138  K 3.7 3.9 3.5  CL 95* 100 101  CO2 25 28 25   GLUCOSE 260* 136* 164*  BUN 15 16 19   CREATININE 1.28* 1.11* 0.90  CALCIUM 9.2 9.3 8.8*  PROT  --  6.9  --    ALBUMIN  --  3.3*  --   AST  --  13*  --   ALT  --  10  --   ALKPHOS  --  51  --   BILITOT  --  0.9  --   GFRNONAA 47* 56* >60  GFRAA 54* >60 >60  ANIONGAP 15 11 12      Hematology Recent Labs  Lab 12/13/18 1424 12/14/18 0134 12/15/18 0323  WBC 7.0 7.4 6.3  RBC 5.30* 5.08 4.83  HGB 14.8 14.3 13.5  HCT 45.3 42.5 40.5  MCV 85.5 83.7 83.9  MCH 27.9 28.1 28.0  MCHC 32.7 33.6 33.3  RDW 14.0 14.0 13.8  PLT 301 312 286    Cardiac Enzymes Recent Labs  Lab 12/13/18 1740 12/13/18 2016 12/14/18 0134 12/14/18 0745  TROPONINI <0.03 0.03* 0.05* 0.03*   No results for input(s): TROPIPOC in the last 168 hours.      Assessment & Plan    1.  Presyncope/ dizziness She had transient symptoms of unclear etiology.  Reports "I could not see" but did not have full syncope.  History is suggestive of either a vagal event or arrhythmia. ekg is markedly abnormal and wall motion abnormality also noted  on echo.  I have reviewed echo at length with Dr Harrington Challenger.  We agree that cath is indication to evaluate for ischemia as the cause for her event.  Will gently hydrate overnight. Discussed the cath with the patient. The patient understands that risks included but are not limited to stroke (1 in 1000), death (1 in 6), kidney failure [usually temporary] (1 in 500), bleeding (1 in 200), allergic reaction [possibly serious] (1 in 200). The patient understands and agrees to proceed.   NPO after midnight.  If no further arrhythmias here, I would advise 30 day monitor at discharge with follow-up in general cardiology clinic  2. HTN Stable No change required today  3. Diabetes Per primary team  Thompson Grayer MD, [facility] 12/15/2018 8:14 AM

## 2018-12-16 ENCOUNTER — Inpatient Hospital Stay (HOSPITAL_COMMUNITY)
Admission: EM | Disposition: A | Discharge status: Home / Self Care | Payer: Self-pay | Source: Home / Self Care | Attending: Internal Medicine

## 2018-12-16 ENCOUNTER — Encounter (HOSPITAL_COMMUNITY): Payer: Self-pay | Admitting: Cardiology

## 2018-12-16 DIAGNOSIS — Z955 Presence of coronary angioplasty implant and graft: Secondary | ICD-10-CM

## 2018-12-16 DIAGNOSIS — I249 Acute ischemic heart disease, unspecified: Secondary | ICD-10-CM

## 2018-12-16 DIAGNOSIS — E1169 Type 2 diabetes mellitus with other specified complication: Secondary | ICD-10-CM

## 2018-12-16 DIAGNOSIS — I25110 Atherosclerotic heart disease of native coronary artery with unstable angina pectoris: Principal | ICD-10-CM

## 2018-12-16 DIAGNOSIS — E785 Hyperlipidemia, unspecified: Secondary | ICD-10-CM

## 2018-12-16 DIAGNOSIS — Z720 Tobacco use: Secondary | ICD-10-CM

## 2018-12-16 DIAGNOSIS — I200 Unstable angina: Secondary | ICD-10-CM

## 2018-12-16 HISTORY — PX: CORONARY STENT INTERVENTION: CATH118234

## 2018-12-16 HISTORY — PX: LEFT HEART CATH AND CORONARY ANGIOGRAPHY: CATH118249

## 2018-12-16 LAB — GLUCOSE, CAPILLARY
Glucose-Capillary: 167 mg/dL — ABNORMAL HIGH (ref 70–99)
Glucose-Capillary: 182 mg/dL — ABNORMAL HIGH (ref 70–99)
Glucose-Capillary: 227 mg/dL — ABNORMAL HIGH (ref 70–99)
Glucose-Capillary: 280 mg/dL — ABNORMAL HIGH (ref 70–99)

## 2018-12-16 LAB — CBC
HCT: 40.2 % (ref 36.0–46.0)
Hemoglobin: 13.6 g/dL (ref 12.0–15.0)
MCH: 28 pg (ref 26.0–34.0)
MCHC: 33.8 g/dL (ref 30.0–36.0)
MCV: 82.7 fL (ref 80.0–100.0)
Platelets: 278 10*3/uL (ref 150–400)
RBC: 4.86 MIL/uL (ref 3.87–5.11)
RDW: 13.6 % (ref 11.5–15.5)
WBC: 7.2 10*3/uL (ref 4.0–10.5)
nRBC: 0 % (ref 0.0–0.2)

## 2018-12-16 LAB — BASIC METABOLIC PANEL
Anion gap: 8 (ref 5–15)
BUN: 16 mg/dL (ref 6–20)
CO2: 25 mmol/L (ref 22–32)
Calcium: 8.7 mg/dL — ABNORMAL LOW (ref 8.9–10.3)
Chloride: 107 mmol/L (ref 98–111)
Creatinine, Ser: 0.76 mg/dL (ref 0.44–1.00)
GFR calc Af Amer: >60 mL/min (ref >60)
GFR calc non Af Amer: >60 mL/min (ref >60)
Glucose, Bld: 119 mg/dL — ABNORMAL HIGH (ref 70–99)
Potassium: 3.6 mmol/L (ref 3.5–5.1)
Sodium: 140 mmol/L (ref 135–145)

## 2018-12-16 LAB — TROPONIN I
Troponin I: 0.26 ng/mL (ref <0.03)
Troponin I: 7.57 ng/mL (ref <0.03)

## 2018-12-16 LAB — POCT ACTIVATED CLOTTING TIME: Activated Clotting Time: 351 seconds

## 2018-12-16 SURGERY — LEFT HEART CATH AND CORONARY ANGIOGRAPHY
Anesthesia: LOCAL

## 2018-12-16 MED ORDER — TICAGRELOR 90 MG PO TABS
ORAL_TABLET | ORAL | Status: DC | PRN
  Administered 2018-12-16: 180 mg via ORAL

## 2018-12-16 MED ORDER — HEPARIN SODIUM (PORCINE) 1000 UNIT/ML IJ SOLN
INTRAMUSCULAR | Status: DC | PRN
  Administered 2018-12-16: 4000 [IU] via INTRAVENOUS
  Administered 2018-12-16: 5000 [IU] via INTRAVENOUS

## 2018-12-16 MED ORDER — ONDANSETRON HCL 4 MG/2ML IJ SOLN
INTRAMUSCULAR | Status: AC
  Filled 2018-12-16: qty 2

## 2018-12-16 MED ORDER — LABETALOL HCL 5 MG/ML IV SOLN: 10.0000 mg | INTRAVENOUS | Status: DC | PRN

## 2018-12-16 MED ORDER — METOPROLOL TARTRATE 25 MG PO TABS
25.0000 mg | ORAL_TABLET | Freq: Two times a day (BID) | ORAL | Status: DC
  Administered 2018-12-16 – 2018-12-17 (×2): 25 mg via ORAL
  Filled 2018-12-16 (×2): qty 1

## 2018-12-16 MED ORDER — SODIUM CHLORIDE 0.9 % IV SOLN: 250.0000 mL | INTRAVENOUS | Status: DC | PRN

## 2018-12-16 MED ORDER — MIDAZOLAM HCL 2 MG/2ML IJ SOLN
INTRAMUSCULAR | Status: AC
  Filled 2018-12-16: qty 2

## 2018-12-16 MED ORDER — TICAGRELOR 90 MG PO TABS
90.0000 mg | ORAL_TABLET | Freq: Two times a day (BID) | ORAL | Status: DC
  Administered 2018-12-16 – 2018-12-17 (×2): 90 mg via ORAL
  Filled 2018-12-16 (×2): qty 1

## 2018-12-16 MED ORDER — VERAPAMIL HCL 2.5 MG/ML IV SOLN
INTRAVENOUS | Status: AC
  Filled 2018-12-16: qty 2

## 2018-12-16 MED ORDER — METOPROLOL TARTRATE 12.5 MG HALF TABLET
12.5000 mg | ORAL_TABLET | Freq: Two times a day (BID) | ORAL | Status: DC
  Administered 2018-12-16: 12.5 mg via ORAL
  Filled 2018-12-16: qty 1

## 2018-12-16 MED ORDER — MIDAZOLAM HCL 2 MG/2ML IJ SOLN
INTRAMUSCULAR | Status: DC | PRN
  Administered 2018-12-16: 1 mg via INTRAVENOUS

## 2018-12-16 MED ORDER — HEPARIN (PORCINE) IN NACL 1000-0.9 UT/500ML-% IV SOLN
INTRAVENOUS | Status: DC | PRN
  Administered 2018-12-16 (×2): 500 mL

## 2018-12-16 MED ORDER — FENTANYL CITRATE (PF) 100 MCG/2ML IJ SOLN
INTRAMUSCULAR | Status: DC | PRN
  Administered 2018-12-16: 25 ug via INTRAVENOUS

## 2018-12-16 MED ORDER — LIDOCAINE HCL (PF) 1 % IJ SOLN
INTRAMUSCULAR | Status: AC
  Filled 2018-12-16: qty 30

## 2018-12-16 MED ORDER — HEPARIN (PORCINE) IN NACL 1000-0.9 UT/500ML-% IV SOLN
INTRAVENOUS | Status: AC
  Filled 2018-12-16: qty 1000

## 2018-12-16 MED ORDER — FENTANYL CITRATE (PF) 100 MCG/2ML IJ SOLN
INTRAMUSCULAR | Status: AC
  Filled 2018-12-16: qty 2

## 2018-12-16 MED ORDER — HYDRALAZINE HCL 20 MG/ML IJ SOLN: 10.0000 mg | INTRAMUSCULAR | Status: DC | PRN

## 2018-12-16 MED ORDER — HEPARIN SODIUM (PORCINE) 1000 UNIT/ML IJ SOLN
INTRAMUSCULAR | Status: AC
  Filled 2018-12-16: qty 1

## 2018-12-16 MED ORDER — IOHEXOL 350 MG/ML SOLN
INTRAVENOUS | Status: DC | PRN
  Administered 2018-12-16: 160 mL via INTRA_ARTERIAL

## 2018-12-16 MED ORDER — TICAGRELOR 90 MG PO TABS
ORAL_TABLET | ORAL | Status: AC
  Filled 2018-12-16: qty 2

## 2018-12-16 MED ORDER — SODIUM CHLORIDE 0.9% FLUSH
3.0000 mL | Freq: Two times a day (BID) | INTRAVENOUS | Status: DC
  Administered 2018-12-16: 3 mL via INTRAVENOUS

## 2018-12-16 MED ORDER — ONDANSETRON HCL 4 MG/2ML IJ SOLN
INTRAMUSCULAR | Status: DC | PRN
  Administered 2018-12-16: 4 mg via INTRAVENOUS

## 2018-12-16 MED ORDER — NITROGLYCERIN 1 MG/10 ML FOR IR/CATH LAB
INTRA_ARTERIAL | Status: AC
  Filled 2018-12-16: qty 10

## 2018-12-16 MED ORDER — SODIUM CHLORIDE 0.9 % IV SOLN
INTRAVENOUS | Status: AC
  Administered 2018-12-16: 09:00:00 via INTRAVENOUS

## 2018-12-16 MED ORDER — SODIUM CHLORIDE 0.9% FLUSH
3.0000 mL | INTRAVENOUS | Status: DC | PRN
  Administered 2018-12-16: 3 mL via INTRAVENOUS
  Filled 2018-12-16: qty 3

## 2018-12-16 MED ORDER — NITROGLYCERIN 1 MG/10 ML FOR IR/CATH LAB
INTRA_ARTERIAL | Status: DC | PRN
  Administered 2018-12-16 (×2): 200 ug via INTRACORONARY

## 2018-12-16 MED ORDER — ACETAMINOPHEN 325 MG PO TABS: 650.0000 mg | ORAL_TABLET | ORAL | Status: DC | PRN

## 2018-12-16 MED ORDER — ATORVASTATIN CALCIUM 80 MG PO TABS
80.0000 mg | ORAL_TABLET | Freq: Every day | ORAL | Status: DC
  Administered 2018-12-16: 80 mg via ORAL
  Filled 2018-12-16: qty 1

## 2018-12-16 MED ORDER — ASPIRIN 81 MG PO CHEW
81.0000 mg | CHEWABLE_TABLET | Freq: Once | ORAL | Status: AC
  Administered 2018-12-16: 81 mg via ORAL
  Filled 2018-12-16: qty 1

## 2018-12-16 MED ORDER — LIDOCAINE HCL (PF) 1 % IJ SOLN
INTRAMUSCULAR | Status: DC | PRN
  Administered 2018-12-16: 4 mL

## 2018-12-16 SURGICAL SUPPLY — 22 items
BALLN SAPPHIRE 2.5X15 (BALLOONS) ×2
BALLN SAPPHIRE ~~LOC~~ 3.0X15 (BALLOONS) ×1 IMPLANT
BALLOON SAPPHIRE 2.5X15 (BALLOONS) IMPLANT
CATH INFINITI 5 FR JL3.5 (CATHETERS) ×1 IMPLANT
CATH INFINITI 5FR ANG PIGTAIL (CATHETERS) ×1 IMPLANT
CATH OPTITORQUE TIG 4.0 5F (CATHETERS) ×1 IMPLANT
CATH VISTA GUIDE 6FR XBLAD3.5 (CATHETERS) ×1 IMPLANT
DEVICE RAD COMP TR BAND LRG (VASCULAR PRODUCTS) ×1 IMPLANT
ELECT DEFIB PAD ADLT CADENCE (PAD) ×1 IMPLANT
GLIDESHEATH SLEND A-KIT 6F 22G (SHEATH) ×1 IMPLANT
GUIDEWIRE INQWIRE 1.5J.035X260 (WIRE) IMPLANT
INQWIRE 1.5J .035X260CM (WIRE) ×2
KIT ENCORE 26 ADVANTAGE (KITS) ×1 IMPLANT
KIT HEART LEFT (KITS) ×2 IMPLANT
PACK CARDIAC CATHETERIZATION (CUSTOM PROCEDURE TRAY) ×2 IMPLANT
SHEATH PROBE COVER 6X72 (BAG) ×1 IMPLANT
STENT SYNERGY DES 2.75X20 (Permanent Stent) ×1 IMPLANT
SYR MEDRAD MARK 7 150ML (SYRINGE) ×1 IMPLANT
TRANSDUCER W/STOPCOCK (MISCELLANEOUS) ×2 IMPLANT
TUBING CIL FLEX 10 FLL-RA (TUBING) ×2 IMPLANT
WIRE ASAHI PROWATER 180CM (WIRE) ×1 IMPLANT
WIRE HI TORQ BMW 190CM (WIRE) IMPLANT

## 2018-12-16 NOTE — Progress Notes (Addendum)
Notified Dr. Myrtie Hawk via phone of troponin 0.26. Patient resting comfortably and denies pain at this time.

## 2018-12-16 NOTE — Interval H&P Note (Signed)
History and Physical Interval Note:  12/16/2018 7:28 AM  Krista [name]  has presented today for surgery, with the diagnosis of unstable angina.  The various methods of treatment have been discussed with the patient and family. After consideration of risks, benefits and other options for treatment, the patient has consented to  Procedure(s): LEFT HEART CATH AND CORONARY ANGIOGRAPHY (N/A) with possible PERCUTANEOUS CORONARY INTERVENTION as a surgical intervention.  The patient's history has been reviewed, patient examined, no change in status, stable for surgery.  I have reviewed the patient's chart and labs.  Questions were answered to the patient's satisfaction.     Cath Lab Visit (complete for each Cath Lab visit)  Clinical Evaluation Leading to the Procedure:   ACS: Yes.    Non-ACS:    Anginal Classification: CCS III  Anti-ischemic medical therapy: No Therapy  Non-Invasive Test Results: Intermediate-risk stress test findings: cardiac mortality 1-3%/year - [place] motional abnormality on Echocardiogram  Prior CABG: No previous CABG    Glenetta Hew

## 2018-12-16 NOTE — Progress Notes (Signed)
Progress Note  Patient Name: Krista [name] Date of Encounter: 12/16/2018  Primary Cardiologist: New  Subjective   No chest pain, just back from Cath Lab status post proximal LAD stenting by Dr. Ellyn Hack  Inpatient Medications    Scheduled Meds: . [MAR Hold] atorvastatin  40 mg Oral q1800  . [MAR Hold] enoxaparin (LOVENOX) injection  40 mg Subcutaneous Q24H  . [MAR Hold] insulin aspart  0-15 Units Subcutaneous TID WC  . [MAR Hold] loratadine  10 mg Oral Daily  . [MAR Hold] nicotine  7 mg Transdermal Daily  . [MAR Hold] pneumococcal 23 valent vaccine  0.5 mL Intramuscular Tomorrow-1000  . sodium chloride flush  3 mL Intravenous Q12H   Continuous Infusions: . sodium chloride    . sodium chloride    . sodium chloride 1 mL/kg/hr (12/15/18 2056)   PRN Meds: sodium chloride, [MAR Hold] acetaminophen **OR** [MAR Hold] acetaminophen, fentaNYL, Heparin (Porcine) in NaCl, heparin, iohexol, lidocaine (PF), midazolam, [MAR Hold] nitroGLYCERIN, nitroGLYCERIN, [MAR Hold] senna-docusate, sodium chloride flush, ticagrelor   Vital Signs    Vitals:   12/15/18 1319 12/15/18 2034 12/16/18 0516 12/16/18 0733  BP: 116/75 (!) 139/94 (!) 149/103   Pulse: 76 87 92   Resp:      Temp: 98 F (36.7 C) 98.3 F (36.8 C) 99.7 F (37.6 C)   TempSrc: Oral Oral Oral   SpO2: 94% 98% 98% 98%  Weight:   80.1 kg   Height:        Intake/Output Summary (Last 24 hours) at 12/16/2018 0850 Last data filed at 12/16/2018 0525 Gross per 24 hour  Intake 1447.71 ml  Output 2000 ml  Net -552.29 ml   Last 3 Weights 12/16/2018 12/15/2018 12/14/2018  Weight (lbs) 176 lb 9.6 oz 176 lb 5.9 oz 173 lb 1 oz  Weight (kg) 80.105 kg 80 kg 78.5 kg      Telemetry    Sinus rhythm although on return from the Cath Lab she did have a white wide-complex tachycardia which quickly resolved.- Personally Reviewed  ECG    No new tracing  Physical Exam   GEN: No acute distress.   Neck: No JVD Cardiac: RRR, no murmurs, rubs,  or gallops.  Respiratory: Clear to auscultation bilaterally. GI: Soft, nontender, non-distended  MS: No edema; No deformity. Neuro:  Nonfocal  Psych: Normal affect   Labs    Chemistry Recent Labs  Lab 12/14/18 0134 12/15/18 0323 12/16/18 0428  NA 139 138 140  K 3.9 3.5 3.6  CL 100 101 107  CO2 28 25 25   GLUCOSE 136* 164* 119*  BUN 16 19 16   CREATININE 1.11* 0.90 0.76  CALCIUM 9.3 8.8* 8.7*  PROT 6.9  --   --   ALBUMIN 3.3*  --   --   AST 13*  --   --   ALT 10  --   --   ALKPHOS 51  --   --   BILITOT 0.9  --   --   GFRNONAA 56* >60 >60  GFRAA >60 >60 >60  ANIONGAP 11 12 8      Hematology Recent Labs  Lab 12/14/18 0134 12/15/18 0323 12/16/18 0428  WBC 7.4 6.3 7.2  RBC 5.08 4.83 4.86  HGB 14.3 13.5 13.6  HCT 42.5 40.5 40.2  MCV 83.7 83.9 82.7  MCH 28.1 28.0 28.0  MCHC 33.6 33.3 33.8  RDW 14.0 13.8 13.6  PLT 312 286 278    Cardiac Enzymes Recent Labs  Lab 12/13/18  1740 12/13/18 2016 12/14/18 0134 12/14/18 0745  TROPONINI <0.03 0.03* 0.05* 0.03*   No results for input(s): TROPIPOC in the last 168 hours.   BNPNo results for input(s): BNP, PROBNP in the last 168 hours.   DDimer No results for input(s): DDIMER in the last 168 hours.   Radiology    No results found.  Cardiac Studies   Echo 12/14/2018 IMPRESSIONS    1. The left ventricle has low normal systolic function, with an ejection fraction of 50-55%. The cavity size was normal. Left ventricular diastolic Doppler parameters are indeterminate.  2. LVEF is approximately 50 to 55% with hypokinesis of the dista/akiensis of the distal infeiror, inferolateral wallsl.  3. The right ventricle has normal systolic function. The cavity was normal. There is no increase in right ventricular wall thickness.  4. Trivial pericardial effusion is present.  5. The aortic valve is tricuspid. Mild thickening of the aortic valve. Aortic valve regurgitation is trivial by color flow Doppler.  Patient Profile      58 y.o. female with hx of HTN, tobacco abuse, DM presented for near syncope.  Assessment & Plan    1. Dizziness/near syncope  - She had transient symptoms of unclear etiology.  Reports "I could not see" but did not have full syncope.  History is suggestive of either a vagal event or arrhythmia. -EKG with anterior lateral TWI.  Echo showed LVEF is approximately 50 to 55% with hypokinesis of the dista/akiensis of the distal infeiror, inferolateral walls. -Cardiac catheterization performed today by Dr. Ellyn Hack via the right radial approach revealed a 99% proximal LAD which was stented with a drug-eluting stent.  She did have moderate diagonal branch, and nondominant circumflex disease which will be treated medically as well as moderate diffuse dominant RCA disease.  Her EF was preserved although she had apical wall motion normality consistent with her anatomy.  She will need to be on dual antiplatelet therapy for at least 12 months uninterrupted.  Given her arrhythmia I think we should consider discharge in the morning. - Per Dr. Rayann Heman, if no arrhthymias consider 30 days event monitor at discharge with general cardiology follow up.  I agree that she would benefit from a 30-day event monitor on discharge.   2. HTN - Intermittently elevated. Home HCTZ on hold.  If blood pressure elevates could consider treating with low-dose calcium channel blocker.  I would avoid beta-blocker since were not aware of what her rhythm is.  Other options would be a low-dose ACE/ARB      For questions or updates, please contact [place] Please consult [email or web address] for contact info under        SignedLeanor Kail, PA  12/16/2018, 8:50 AM

## 2018-12-16 NOTE — Progress Notes (Signed)
CRITICAL VALUE ALERT  Critical Value:  Troponin 7.57  Date & Time Notied:  12/16/2018 at [place]  Provider Notified: Cecilie Kicks NP  Orders Received/Actions taken: Pending; night shift RN [place] to follow up with cardiology fellow since timing is so close to change of shift.

## 2018-12-16 NOTE — Progress Notes (Signed)
   Subjective: Patient was seen and evaluated at bedside on morning rounds. She just came back from left heart cath. We discussed cath finding and she is aware that she had a blockage and stent placed for her. She complains of 10/10 pain at middle of her chest. It is different than her prior chest pain she had this morning before cath. It is associated with nausea. She endorses some shortness of breath with that.    Objective:  Vital signs in last 24 hours: Vitals:   12/16/18 0851 12/16/18 0856 12/16/18 0927 12/16/18 1000  BP:   121/77 (!) 142/77  Pulse: (!) 0 (!) 0 76 80  Resp: (!) 0  18   Temp:      TempSrc:      SpO2: (!) 0%  100% 98%  Weight:      Height:       Physical Exam:  VS reviewed, nursing notes reviewed. General: She is in pain CV: RRR, nl S1S2 Pulm: CTA bilaterally, no crackle Abdomen: Soft and non tender to palpation Musculoskeletal: Pulses are palpable.  Neurologic exam: Alert and oriented x 3  Assessment/Plan:  Principal Problem:   Near syncope Active Problems:   Diabetes ([place])   HTN (hypertension)   Tobacco abuse   Prolonged Q-T interval on ECG   Chest tightness   Unstable angina ([name])   ACS:  She presented with dizziness, diaphoresis and nausea, vomiting. with new EKG changes (T inversion) in inferolateral lead and slightly elevated Trop that trended down at 3th draw. Cath today showed 99% stenosed prox LAD with 99% stenosed side branch in Ost 1st Diag. DES placed. Post intervention, the side branch was reduced to 90% residual stenosis with downstream 80% stenosis. Plan is to treat this medically.  Patient complaints of 10/10 chest pain and nausea. Saturating 100% at room air. We were concern if she has post stent complications. But can also be expected pain after Cath. Stat EKG performed and was unchanged from yesterday. Trop is pending. Will follow Trop.    -Increase Lipitor to 80 mg QD -ASA 80 mg QD -Brilinta 90 mg BID -Metoprolol tartrate 12.5  mg BID -May add ACE I/ARB outpatient when tolerates -Continue cardiac monitoring -Continue pulse oxymetry  -No driving for 6 months -Will be on dual anti plt therapy for at least a year -Will need close follow up out patient for risk factor modifications   Presyncope: Can be vasovagal/cardiogenic in setting of ACS. (As she found to have sever RCA stenosis that could also contributing with low BP and vasovasal symptoms.) or arrhythmia. No arhythmia in hospital per tele review. Recommended 30 day monitor at discharge by cardiology.   AKI: Prerenal volume depleted.Resolved.  Tried to hydrate gently before cath.  HTN: Has been off of hctz, and BP was ranging between 110s-120s. BP this AM 139/84.   -added Metoprolol 12.5 mg BID for ACS -Will monitor BP -May add ACE I/ARB outpatient when tolerates -continue to monitor  T2DM:On Metformin at home.Hb A1c: 7.3 BG today 119. (Ranging<170)  -Holding metformin -  Moderate SSI TID with meals  HLD: in the setting of T2DM - Started atorvastatin 40mg    Dispo: Anticipated discharge this PM or tomorrow AM depending on clinical reevaluation  Dewayne Hatch, MD 12/16/2018, 10:46 AM Pager: [phone]

## 2018-12-16 NOTE — Discharge Summary (Addendum)
Name: Krista [name] MRN: [id number] DOB: Jan 28, 1961 58 y.o. PCP: Mardi Mainland, FNP  Date of Admission: 12/13/2018  1:58 PM Date of Discharge: 12/16/2018 Attending Physician: Oda Kilts, MD  Discharge Diagnosis: Unstable angina Near syncope  Discharge Medications: Allergies as of 12/17/2018   No Known Allergies     Medication List    STOP taking these medications   hydrochlorothiazide 25 MG tablet Commonly known as:  HYDRODIURIL     TAKE these medications   aspirin 81 MG EC tablet Take 1 tablet (81 mg total) by mouth daily. Start taking on:  Dec 18, 2018   atorvastatin 80 MG tablet Commonly known as:  LIPITOR Take 1 tablet (80 mg total) by mouth daily at 6 PM.   lisinopril 2.5 MG tablet Commonly known as:  ZESTRIL Take 1 tablet (2.5 mg total) by mouth daily. Start taking on:  Dec 18, 2018   loratadine 10 MG tablet Commonly known as:  CLARITIN Take 10 mg by mouth daily.   metFORMIN 500 MG tablet Commonly known as:  GLUCOPHAGE Take 500 mg by mouth 2 (two) times daily with a meal.   metoprolol tartrate 25 MG tablet Commonly known as:  LOPRESSOR Take 1 tablet (25 mg total) by mouth 2 (two) times daily.   naproxen 500 MG tablet Commonly known as:  NAPROSYN Take 500 mg by mouth at bedtime.   nitroGLYCERIN 0.4 MG SL tablet Commonly known as:  NITROSTAT Place 1 tablet (0.4 mg total) under the tongue every 5 (five) minutes as needed for chest pain.   ticagrelor 90 MG Tabs tablet Commonly known as:  BRILINTA Take 1 tablet (90 mg total) by mouth 2 (two) times daily.       Disposition and follow-up:   KristaAmaris A [name] was discharged from [facility] in Stable condition.  At the hospital follow up visit please address:  1. Continue dual antiplatelet therapy (ASA and Brilinta) for at least a year DES placement.   2. Would recommend aggressive risk factor management modification, DM and HTN  and cholesterol control, and  smoking cessation counseling. (She mentions that Chantix helped her to quit last time).  3. Patient will have 30 day cardiac monitoring after discharge and recommended to not drive for 6 months.  4.  Labs / imaging needed at time of follow-up: BMP  5.  Pending labs/ test needing follow-up: None  Follow-up Appointments: Follow-up Information    Mardi Mainland, FNP. Schedule an appointment as soon as possible for a visit in 1 week(s).   Specialty:  Nurse Practitioner Contact information: [place] [address] [phone]        Almyra Deforest, [place] Follow up on 12/27/2018.   Specialties:  Cardiology, Radiology Why:  @1pm  for hospital virtual visit follow up  Office will give you call for event monitor set up Contact information: [address] [place] [address] [phone]           Hospital Course by problem list: 1. Unstable angina, near syncope: Krista [name] is a 58 year old woman with PMH of HTN, DM2, tobacco use who presented to [facility] ED due with lightheadedness, diaphoresis and near syncope when she was driving. She had mildly elevated Troponin, EKG with T-wave inversion in the anterior and inferior leads. Echo showed LVEF 50-55% with hypokinesis of the distal wall and akinesis of the inferolateral walls.   Given EKG changes and wall motion abnormality on echo  and concerning history, patient underwent left heart catheterization, which showed 99% stenosis of proximal LAD which was opened by DES with 0% residual stenosis. She had residual stenosis of 1st diagonal branch (90%) and RCA (60%), that was not amenable to PCI.   Plan for DAPT for at least 1 year, high intensity statin, DM control (Hb A1c 7.3%) and smoking cessation. LDL was 112. She was on HCTZ at home for HTN, which was changed to metoprolol and lisinopril.  2. Presyncope: Unclear etiology--possible hypovolemic, vasovagal related to angina, or possibly arrhythmogenic.  Cardiology recommended 30 day cardiac monitoring at discharge and no driving for 6 months.  3. DM: Metformin resumed at discharge. A1C 7.3%, could consider GLP1 agonist at follow up given cardiovascular protective effects (did not start immediately to allow time to make other medication changes first).   Discharge Vitals:   BP (!) 121/92    Pulse 91    Temp 99.2 F (37.3 C) (Oral)    Resp 18    Ht 5\' 3"  (1.6 m)    Wt 79.4 kg    SpO2 97%    BMI 31.02 kg/m   Pertinent Labs, Studies, and Procedures:  Component     Latest Ref Rng & Units 12/13/2018 12/13/2018 12/14/2018 12/14/2018         5:40 PM  8:16 PM  1:34 AM  7:45 AM  Troponin I     <0.03 ng/mL <0.03 0.03 (HH) 0.05 (HH) 0.03 (HH)   Component     Latest Ref Rng & Units 12/16/2018 12/16/2018 12/17/2018 12/17/2018        11:02 AM  4:54 PM 12:02 AM  6:00 AM  Troponin I     <0.03 ng/mL 0.26 (HH) 7.57 (HH) 6.63 (HH) 5.25 (HH)  Lipids panel with Total Choleterol 167, LDL 112, HDL 41 HbA1c 7.3.  Left heart cath 12/16/2018:  [place] Cath 12/16/2018:  Severe CAD with severe 99% proximal-mid LAD at takeoff of 1st Diag & Septal branches associated with severe 99% ostial stenosis in both branches (1st Diag is a relatively small caliber vessel and diffusely diseased), diffuse moderate to severe 55 to 65% stenoses in proximal and mid RCA as well as RPDA.  Successful DES PCI of LAD lesion with synergy DES 2.7 mm x 20 mm (3.1 mm) preserving diagonal and septal flow.  Preserved LVEF with apparent apical hypokinesis consistent with echocardiogram findings  Normal LVEDP  IMPRESSIONS  Echo 12/14/2018  1. The left ventricle has low normal systolic function, with an ejection fraction of 50-55%. The cavity size was normal. Left ventricular diastolic Doppler parameters are indeterminate.  2. LVEF is approximately 50 to 55% with hypokinesis of the dista/akiensis of the distal infeiror, inferolateral wallsl.  3. The right ventricle has normal systolic function. The cavity  was normal. There is no increase in right ventricular wall thickness.  4. Trivial pericardial effusion is present.  5. The aortic valve is tricuspid. Mild thickening of the aortic valve. Aortic valve regurgitation is trivial by color flow Doppler.    Discharge Instructions: Discharge Instructions    Amb Referral to Cardiac Rehabilitation   Complete by:  As directed    Diagnosis:  Coronary Stents   Ambulatory referral to Cardiology   Complete by:  As directed    Patient presented with ACS and presyncope. Underwent LH cath and DES placed on LAD.   Call MD for:  persistant dizziness or light-headedness   Complete by:  As directed    Call MD for:  severe uncontrolled pain   Complete by:  As directed    Diet - low sodium heart healthy   Complete by:  As directed    Diet - low sodium heart healthy   Complete by:  As directed    Discharge instructions   Complete by:  As directed    Thank you for allowing [place] taking care of you at [facility].  We are glad that you are doing better.  As we discussed, it is very important to take your medications as instructed and try to stop smoking.  Please pay attention to changes we made in your medications. Follow up with cardiologist and your PCP within a week. Avoid driving for 6 months or until clear by cardiologist. You can get nicotine patch over-the-counter for smoke cessation.  Please call us at [phone] if you have any questions or concern.  Thank.   Increase activity slowly   Complete by:  As directed    Increase activity slowly   Complete by:  As directed      Signed: Dewayne Hatch, MD 12/17/2018, 1:38 PM   Pager: [phone]    Internal Medicine Attending Note:  I saw and examined the patient on the day of discharge. I reviewed and agree with the discharge summary written by the house staff.  Lenice Pressman, M.D., Ph.D.

## 2018-12-16 NOTE — Progress Notes (Signed)
Pt did have arrhthymias  post cath, and troponin was 0.26 tonight her Troponin is up to 7.57. pt is pain free.  EKG is still abnormal. She has more troponins ordered.   BP is elevated.  Will increase her BB, temp of 99.1

## 2018-12-17 ENCOUNTER — Telehealth: Payer: Self-pay | Admitting: Physician Assistant

## 2018-12-17 ENCOUNTER — Other Ambulatory Visit: Payer: Self-pay | Admitting: Physician Assistant

## 2018-12-17 DIAGNOSIS — Z7982 Long term (current) use of aspirin: Secondary | ICD-10-CM

## 2018-12-17 DIAGNOSIS — I214 Non-ST elevation (NSTEMI) myocardial infarction: Secondary | ICD-10-CM

## 2018-12-17 DIAGNOSIS — R55 Syncope and collapse: Secondary | ICD-10-CM

## 2018-12-17 LAB — BASIC METABOLIC PANEL
Anion gap: 12 (ref 5–15)
BUN: 15 mg/dL (ref 6–20)
CO2: 22 mmol/L (ref 22–32)
Calcium: 8.9 mg/dL (ref 8.9–10.3)
Chloride: 102 mmol/L (ref 98–111)
Creatinine, Ser: 0.82 mg/dL (ref 0.44–1.00)
GFR calc Af Amer: >60 mL/min (ref >60)
GFR calc non Af Amer: >60 mL/min (ref >60)
Glucose, Bld: 161 mg/dL — ABNORMAL HIGH (ref 70–99)
Potassium: 3.8 mmol/L (ref 3.5–5.1)
Sodium: 136 mmol/L (ref 135–145)

## 2018-12-17 LAB — CBC
HCT: 39.3 % (ref 36.0–46.0)
Hemoglobin: 13.3 g/dL (ref 12.0–15.0)
MCH: 28.1 pg (ref 26.0–34.0)
MCHC: 33.8 g/dL (ref 30.0–36.0)
MCV: 82.9 fL (ref 80.0–100.0)
Platelets: 286 10*3/uL (ref 150–400)
RBC: 4.74 MIL/uL (ref 3.87–5.11)
RDW: 13.5 % (ref 11.5–15.5)
WBC: 7.2 10*3/uL (ref 4.0–10.5)
nRBC: 0 % (ref 0.0–0.2)

## 2018-12-17 LAB — GLUCOSE, CAPILLARY
Glucose-Capillary: 148 mg/dL — ABNORMAL HIGH (ref 70–99)
Glucose-Capillary: 108 mg/dL — ABNORMAL HIGH (ref 70–99)

## 2018-12-17 LAB — TROPONIN I
Troponin I: 6.63 ng/mL (ref <0.03)
Troponin I: 5.25 ng/mL (ref <0.03)

## 2018-12-17 MED ORDER — LISINOPRIL 2.5 MG PO TABS
2.5000 mg | ORAL_TABLET | Freq: Every day | ORAL | Status: DC
  Administered 2018-12-17: 2.5 mg via ORAL
  Filled 2018-12-17: qty 1

## 2018-12-17 MED ORDER — METOPROLOL TARTRATE 25 MG PO TABS: 25.0000 mg | ORAL_TABLET | Freq: Two times a day (BID) | ORAL | 1 refills | Status: DC

## 2018-12-17 MED ORDER — ASPIRIN 81 MG PO TBEC: 81.0000 mg | DELAYED_RELEASE_TABLET | Freq: Every day | ORAL | 2 refills | Status: DC

## 2018-12-17 MED ORDER — ASPIRIN EC 81 MG PO TBEC
81.0000 mg | DELAYED_RELEASE_TABLET | Freq: Every day | ORAL | Status: DC
  Administered 2018-12-17: 81 mg via ORAL
  Filled 2018-12-17: qty 1

## 2018-12-17 MED ORDER — LISINOPRIL 2.5 MG PO TABS: 2.5000 mg | ORAL_TABLET | Freq: Every day | ORAL | 2 refills | Status: DC

## 2018-12-17 MED ORDER — NITROGLYCERIN 0.4 MG SL SUBL: 0.4000 mg | SUBLINGUAL_TABLET | SUBLINGUAL | 1 refills | Status: DC | PRN

## 2018-12-17 MED ORDER — TICAGRELOR 90 MG PO TABS: 90.0000 mg | ORAL_TABLET | Freq: Two times a day (BID) | ORAL | 1 refills | Status: DC

## 2018-12-17 MED ORDER — ATORVASTATIN CALCIUM 80 MG PO TABS: 80.0000 mg | ORAL_TABLET | Freq: Every day | ORAL | 1 refills | Status: DC

## 2018-12-17 MED FILL — Verapamil HCl IV Soln 2.5 MG/ML: INTRAVENOUS | Qty: 2 | Status: AC

## 2018-12-17 NOTE — Care Management (Signed)
1414 12-17-18 CM did speak with patient- no insurance and has PCP at [facility]. Hospital follow up appointment scheduled and placed on the AVS. No insurance and patient states she has help with medications. Pt provided 30 day free card for Brilinta. CM did make patient aware to let cardiology know that she can't afford Brilinta. Patient may need patient assistance vs change to Plavix. No further needs from CM at this time. Bethena Roys, RN,BSN Case Manager. [phone]

## 2018-12-17 NOTE — Progress Notes (Signed)
Progress Note  Patient Name: Krista [name] Date of Encounter: 12/17/2018  Primary Cardiologist: New to Dr. Gwenlyn Found  Subjective   Postop day #1 proximal LAD PCI drug-eluting stenting with moderate residual disease treated medically.  She is asymptomatic.  Denies chest pain.  Inpatient Medications    Scheduled Meds: . atorvastatin  80 mg Oral q1800  . insulin aspart  0-15 Units Subcutaneous TID WC  . loratadine  10 mg Oral Daily  . metoprolol tartrate  25 mg Oral BID  . nicotine  7 mg Transdermal Daily  . pneumococcal 23 valent vaccine  0.5 mL Intramuscular Tomorrow-1000  . sodium chloride flush  3 mL Intravenous Q12H  . ticagrelor  90 mg Oral BID   Continuous Infusions: . sodium chloride    . sodium chloride     PRN Meds: sodium chloride, acetaminophen, nitroGLYCERIN, senna-docusate, sodium chloride flush   Vital Signs    Vitals:   12/16/18 1300 12/16/18 1937 12/17/18 0500 12/17/18 0501  BP: 132/84 (!) 146/94  (!) 142/98  Pulse: 71 79  81  Resp:      Temp: 98.8 F (37.1 C) 99.1 F (37.3 C)  99.2 F (37.3 C)  TempSrc: Oral Oral  Oral  SpO2: 99% 98%  97%  Weight:   79.4 kg   Height:        Intake/Output Summary (Last 24 hours) at 12/17/2018 0754 Last data filed at 12/17/2018 0700 Gross per 24 hour  Intake 1582.36 ml  Output 3850 ml  Net -2267.64 ml   Last 3 Weights 12/17/2018 12/16/2018 12/15/2018  Weight (lbs) 175 lb 1.6 oz 176 lb 9.6 oz 176 lb 5.9 oz  Weight (kg) 79.425 kg 80.105 kg 80 kg      Telemetry    Normal sinus rhythm- Personally Reviewed  ECG    SR with global TWI - Personally Reviewed  Physical Exam   GEN: No acute distress.   Neck: No JVD Cardiac: RRR, no murmurs, rubs, or gallops.  Respiratory: Clear to auscultation bilaterally. GI: Soft, nontender, non-distended  MS: No edema; No deformity. Neuro:  Nonfocal  Psych: Normal affect   Labs    Chemistry Recent Labs  Lab 12/14/18 0134 12/15/18 0323 12/16/18 0428 12/17/18 0002  NA  139 138 140 136  K 3.9 3.5 3.6 3.8  CL 100 101 107 102  CO2 28 25 25 22   GLUCOSE 136* 164* 119* 161*  BUN 16 19 16 15   CREATININE 1.11* 0.90 0.76 0.82  CALCIUM 9.3 8.8* 8.7* 8.9  PROT 6.9  --   --   --   ALBUMIN 3.3*  --   --   --   AST 13*  --   --   --   ALT 10  --   --   --   ALKPHOS 51  --   --   --   BILITOT 0.9  --   --   --   GFRNONAA 56* >60 >60 >60  GFRAA >60 >60 >60 >60  ANIONGAP 11 12 8 12      Hematology Recent Labs  Lab 12/15/18 0323 12/16/18 0428 12/17/18 0002  WBC 6.3 7.2 7.2  RBC 4.83 4.86 4.74  HGB 13.5 13.6 13.3  HCT 40.5 40.2 39.3  MCV 83.9 82.7 82.9  MCH 28.0 28.0 28.1  MCHC 33.3 33.8 33.8  RDW 13.8 13.6 13.5  PLT 286 278 286    Cardiac Enzymes Recent Labs  Lab 12/16/18 1102 12/16/18 1654 12/17/18 0002 12/17/18 0600  TROPONINI 0.26* 7.57* 6.63* 5.25*     Radiology    No results found.  Cardiac Studies   CORONARY STENT INTERVENTION  LEFT HEART CATH AND CORONARY ANGIOGRAPHY  Conclusion     Prox LAD lesion is 99% stenosed with 99% stenosed side branch in Ost 1st Diag.  A drug-eluting stent was successfully placed using a STENT SYNERGY DES 2.75X20. Postdilated to 3.1 mm  Post intervention, there is a 0% residual stenosis.  Post intervention, the side branch was reduced to 90% residual stenosis with downstream 80% stenosis. Plan is to treat this medically.  ------------------------------------------------------  Prox RCA lesion is 60% stenosed. Mid RCA lesion is 35% stenosed. Mid RCA to Dist RCA lesion is 55% stenosed. RPDA lesion is 65% stenosed.  Ost Ramus to Ramus lesion is 45% stenosed.  The left ventricular systolic function is normal. The left ventricular ejection fraction is 50-55% by visual estimate. -Apical hypokinesis  ==============================   SUMMARY  Severe CAD with severe 99% proximal-mid LAD at takeoff of 1st Diag & Septal branches associated with severe 99% ostial stenosis in both branches (1st Diag is  a relatively small caliber vessel and diffusely diseased), diffuse moderate to severe 55 to 65% stenoses in proximal and mid RCA as well as RPDA.  Successful DES PCI of LAD lesion with synergy DES 2.7 mm x 20 mm (3.1 mm) preserving diagonal and septal flow.  Preserved LVEF with apparent apical hypokinesis consistent with echocardiogram findings  Normal LVEDP  RECOMMENDATIONS  Return patient to nursing unit for ongoing care.  Anticipate potential discharge either later on this afternoon after bedrest versus tomorrow morning pending clinical evaluation.  Would recommend aggressive risk factor management modification: High-dose statin, smoking cessation counseling, close glycemic control, close blood pressure control using ACE inhibitor/ARB and beta-blocker if tolerated  For now plan will be to treat the RCA lesions medically.  Given the diffuse nature of disease, there would be extensive amount of stent uses to cover the entire diseased segment.  Would reevaluate symptoms once she has recovered from this acute event.  Dual antiplatelet coverage for minimum 1 year    Diagnostic  Dominance: Right    Intervention     Echo 12/14/2018 IMPRESSIONS   1. The left ventricle has low normal systolic function, with an ejection fraction of 50-55%. The cavity size was normal. Left ventricular diastolic Doppler parameters are indeterminate. 2. LVEF is approximately 50 to 55% with hypokinesis of the dista/akiensis of the distal infeiror, inferolateral wallsl. 3. The right ventricle has normal systolic function. The cavity was normal. There is no increase in right ventricular wall thickness. 4. Trivial pericardial effusion is present. 5. The aortic valve is tricuspid. Mild thickening of the aortic valve. Aortic valve regurgitation is trivial by color flow Doppler.  Patient Profile     58 y.o. female with hx of HTN, tobacco abuse, DM presented for near syncope.  Assessment & Plan    1.  Dizziness/near syncope  - She had transient symptoms of unclear etiology. Reports "I could not see" but did not have full syncope. History is suggestive of either a vagal event or arrhythmia. -EKG with anterior lateral TWI.  Echo showed LVEF is approximately 50 to 55% with hypokinesis of the dista/akiensis of the distal infeiror, inferolateral walls. - Cath as above s/p PCI of pLAD. Medical management of other disease.  - Per Dr. Rayann Heman, if no arrhthymias consider 30 days event monitor at discharge with general cardiology follow up.  I agree that she would  benefit from a 30-day event monitor on discharge. - Per note, patient did had arrhthymias post cath, ? Type. - No driving for 6 months.   2. HTN - Intermittently elevated. Home HCTZ on hold.  - Started on BB yesterday. Will add low dose Lisinopril. Renal function stable.   3. CAD - Detailed cath as above. S/p DES to pLAD. 1st diag 99% but small caliber, diffuse RCA disease>> Treat medically.    - Continue DAPT with ASA and Brillinta. Continue statin and BB.  4. HLD - 12/14/2018: Cholesterol 167; HDL 41; LDL Cholesterol 112; Triglycerides 72; VLDL 14 LDL goal less than 70. Continue high intensity statin.  - Consider OP f/u labs 6-8 weeks given statin initiation this admission.  5. Tobacco smoking - Encouraged cessation  6. DM - A1c 7.3. Per primary  7. Arrhthymias post cath - Per MD  [facility] will sign off.   Medication Recommendations: Continue dual antiplatelet therapy with aspirin and ticagrelor as well as high-dose statin therapy Other recommendations (labs, testing, etc): Needs 30-day event monitor Follow up as an outpatient: TOC 7 followed by return office visit with me in 4 to 6 weeks  For questions or updates, please contact [place] Please consult [email or web address] for contact info under        Signed, Leanor Kail, PA  12/17/2018, 7:54 AM    Agree with note by Robbie Lis PA-C  Postop day #1  proximal LAD PCI drug-eluting stenting by Dr. Ellyn Hack with an excellent result.  There was encroachment on a septal perforator and diagonal branch both of which were small but remain patent.  There was residual disease that was moderate in nature in the RCA and circumflex that will be treated medically.  Her LV function is normal.  Her troponins did go up to the 7 range post intervention.  She had anterolateral T wave inversion which is new but she is completely asymptomatic.  Her rhythm is sinus.  She did have some wide complex tachycardia yesterday with frequent PVCs.  She will need a 30-day event monitor given her presentation was syncope.  Okay for discharged home, TOC 7, return office with me in 4 to 6 weeks.  Lorretta Harp, M.D., [place], [facility], [place] [address] [place], [place]  [address]  [phone] 12/17/2018 9:36 AM

## 2018-12-17 NOTE — Telephone Encounter (Signed)
   TELEPHONE CALL NOTE  This patient has been deemed a candidate for follow-up tele-health visit to limit community exposure during the Covid-19 pandemic. I spoke with the patient via phone to discuss instructions. This has been outlined on the patient's AVS (dotphrase: hcevisitinfo). The patient was advised to review the section on consent for treatment as well. The patient will receive a phone call 2-3 days prior to their E-Visit at which time consent will be verbally confirmed.   A Virtual Office Visit appointment type has been scheduled for hospital follow up with Dr. Gwenlyn Found or APP, with "VIDEO" or "TELEPHONE" in the appointment notes - patient prefers VIDEO type.  I have either confirmed the patient is active in MyChart or offered to send sign-up link to phone/email via Mychart icon beside patient's photo.  [place], [place] 12/17/2018 12:41 PM

## 2018-12-17 NOTE — Progress Notes (Signed)
Cardiac Rehab Advisory Cardiac Rehab Phase I is not seeing pts face to face at this time due to Covid 19 restrictions. Ambulation is occurring through nursing, PT, and mobility teams. We will help facilitate that process as needed. We are calling pts in their rooms and discussing education. We will then deliver education materials to pts RN for delivery to pt.   [phone] Education completed with pt by phone. Reviewed importance of brilinta with stent. Reviewed NTG use, counting carbs and heart healthy food choices, walking for ex, smoking cessation, and CRP 2. Referred to [facility] program. Pt stated she has talked to cardiologist about Chantix. Plans to try to quit smoking. Smoking cessation handout will be given. Will also give heart healthy and diabetic diets. Encouraged pt to walk with staff prior to discharge. Has been walking in room to bathroom without CP or dizziness.Graylon Good RN BSN 12/17/2018 10:39 AM

## 2018-12-17 NOTE — Progress Notes (Addendum)
   Subjective: Patient was seen and evaluated at bedside on morning rounds. She is eating her breakfast and mentions that she is doing very well today. No acute events overnight. She did not have any further chest pain, sob. Also denies palpitation. We discussed the plan for after discharge including her medication changes, improtance of smoking cessation and following up with PCP and cardiology.  Verbalized understanding and agrees with plan.   Objective:  Vital signs in last 24 hours: Vitals:   12/16/18 1300 12/16/18 1937 12/17/18 0500 12/17/18 0501  BP: 132/84 (!) 146/94  (!) 142/98  Pulse: 71 79  81  Resp:      Temp: 98.8 F (37.1 C) 99.1 F (37.3 C)  99.2 F (37.3 C)  TempSrc: Oral Oral  Oral  SpO2: 99% 98%  97%  Weight:   79.4 kg   Height:       Physical Exam:  VS reviewed, nursing notes reviewed. General: Pleasant lady, in NAD CV: RRR, nl S1S2, no JVD Pulm: CTA bilaterally, no crackle no wheezing Abdomen: Soft and non tender to palpation Musculoskeletal: Catheter insertion area at righ wrist with clean and intact dressing. right hand is pink and appears normal. Pulses are normal. Skin is pink, cappilary refilling is <2 seconds. Neurologic exam: Alert and oriented x3   Assessment/Plan:  Principal Problem:   Near syncope Active Problems:   Diabetes ([place])   HTN (hypertension)   Tobacco abuse   Prolonged Q-T interval on ECG   Chest tightness   Unstable angina ([name])  ACS: She presented with dizziness, diaphoresis and nausea, vomiting. with newEKG changes (T inversion) in inferolateral lead and slightly elevated Trop.  Left heart catheterization showed 99% stenosed prox LAD with 99% stenosed side branch in Ost 1st Diag. With diffuse RCA stenosis. DES placed at LAD.  With post stent residual stenosis. Plan is to treat this medically.  She had chest pain and SOB yesterday morning after cath with transient elevation in Trop.  No complication post catheterization and  her symptoms improved. Currently she feels very well and denies any chest pain, sob or palpitation. Cardiology is on board and will visit patient today.  Likely DC today.  -C/w dual anti plt therapy ASA 80 mg QD and Brilinta 90 mg BID fpr at least a year -C/w Lipitor 80 mg QD -Increased Metoprolol tartrate to 12.5 mg BID. May switch to long acting BB at DC -Lisinopril 2.5 mg QD -Appreciate cardiology recommendation. -F/u with PCP in a week and continue close follow up for risk factor modifications -F/u with cardiology and cardaic rehab after DC -Considering Wellbutrin for smoke cessation   Presyncope: No further dizziness or diaphoresis. Had some bigeminate PVC yesterday. Has remained sinus rhythm today then -Recommended 30 day monitor at discharge by cardiology. -No driving for 6 months  AKI: Prerenal volume depleted. Resolved.  Cr stable at 0.8  HTN:Has been off of hctz, BP 140s/80s today on Metoprolol 12.5 mg BID.  -Discontinue HCTZ at DC and switching to BB and ARB -Will monitor BP -f/u with PCP  T2DM:On Metformin at home.Hb A1c: 7.3 BG today 119. (Ranging<170)  -Resume metformin at DC -F/u with PCP. Can consider GLP 1 agonist later for cardioprotective effect  HLD: -Increwased atorvastatin to 80 mg daily  Dispo: Anticipated discharge today  Dewayne Hatch, MD 12/17/2018, 6:44 AM Pager: [phone]

## 2018-12-17 NOTE — Discharge Instructions (Signed)
No driving for 6 months unless cleared by Dr. Gwenlyn Found or Dr.Allred. No lifting over 5 lbs for 1 week. No sexual activity for 1 week. You may return to work on after hospital follow up visit. Keep procedure site clean & dry. If you notice increased pain, swelling, bleeding or pus, call/return!  You may shower, but no soaking baths/hot tubs/pools for 1 week.    YOUR CARDIOLOGY TEAM HAS ARRANGED FOR AN E-VISIT FOR YOUR APPOINTMENT - PLEASE REVIEW IMPORTANT INFORMATION BELOW SEVERAL DAYS PRIOR TO YOUR APPOINTMENT  Due to the recent COVID-19 pandemic, we are transitioning in-person office visits to tele-medicine visits in an effort to decrease unnecessary exposure to our patients, their families, and staff. These visits are billed to your insurance just like a normal visit is. We also encourage you to sign up for MyChart if you have not already done so. You will need a smartphone if possible. For patients that do not have this, we can still complete the visit using a regular telephone but do prefer a smartphone to enable video when possible. You may have a family member that lives with you that can help. If possible, we also ask that you have a blood pressure cuff and scale at home to measure your blood pressure, heart rate and weight prior to your scheduled appointment. Patients with clinical needs that need an in-person evaluation and testing will still be able to come to the office if absolutely necessary. If you have any questions, feel free to call our office.     YOUR PROVIDER WILL BE USING THE FOLLOWING PLATFORM TO COMPLETE YOUR VISIT:    IF USING MYCHART - How to Download the MyChart App to Your SmartPhone   - If Apple, go to [employer] and type in MyChart in the search bar and download the app. If Android, ask patient to go to [employer] and type in [place] in the search bar and download the app. The app is free but as with any other app downloads, your phone may require you to verify saved  payment information or Apple/Android password.  - You will need to then log into the app with your MyChart username and password, and select [facility] as your healthcare provider to link the account.  - When it is time for your visit, go to the MyChart app, find appointments, and click Begin Video Visit. Be sure to Select Allow for your device to access the Microphone and Camera for your visit. You will then be connected, and your provider will be with you shortly.  **If you have any issues connecting or need assistance, please contact MyChart service desk [phone])**  **If using a computer, in order to ensure the best quality for your visit, you will need to use either of the following Internet Browsers: Insurance underwriter or Microsoft Edge**   IF USING DOXIMITY or DOXY.ME - The staff will give you instructions on receiving your link to join the meeting the day of your visit.      2-3 DAYS BEFORE YOUR APPOINTMENT  You will receive a telephone call from one of our [facility] team members - your caller ID may say "Unknown caller." If this is a video visit, we will walk you through how to get the video launched on your phone. We will remind you check your blood pressure, heart rate and weight prior to your scheduled appointment. If you have an Apple Watch or Jodelle Red, please upload any pertinent ECG strips the  day before or morning of your appointment to [facility]. Our staff will also make sure you have reviewed the consent and agree to move forward with your scheduled tele-health visit.     THE DAY OF YOUR APPOINTMENT  Approximately 15 minutes prior to your scheduled appointment, you will receive a telephone call from one of [facility] team - your caller ID may say "Unknown caller."  Our staff will confirm medications, vital signs for the day and any symptoms you may be experiencing. Please have this information available prior to the time of visit start. It may also be helpful for you  to have a pad of paper and pen handy for any instructions given during your visit. They will also walk you through joining the smartphone meeting if this is a video visit.    CONSENT FOR TELE-HEALTH VISIT - PLEASE REVIEW  I hereby voluntarily request, consent and authorize [facility] and its employed or contracted physicians, physician assistants, nurse practitioners or other licensed health care professionals (the Practitioner), to provide me with telemedicine health care services (the Services") as deemed necessary by the treating Practitioner. I acknowledge and consent to receive the Services by the Practitioner via telemedicine. I understand that the telemedicine visit will involve communicating with the Practitioner through live audiovisual communication technology and the disclosure of certain medical information by electronic transmission. I acknowledge that I have been given the opportunity to request an in-person assessment or other available alternative prior to the telemedicine visit and am voluntarily participating in the telemedicine visit.  I understand that I have the right to withhold or withdraw my consent to the use of telemedicine in the course of my care at any time, without affecting my right to future care or treatment, and that the Practitioner or I may terminate the telemedicine visit at any time. I understand that I have the right to inspect all information obtained and/or recorded in the course of the telemedicine visit and may receive copies of available information for a reasonable fee.  I understand that some of the potential risks of receiving the Services via telemedicine include:   Delay or interruption in medical evaluation due to technological equipment failure or disruption;  Information transmitted may not be sufficient (e.g. poor resolution of images) to allow for appropriate medical decision making by the Practitioner; and/or   In rare instances, security  protocols could fail, causing a breach of personal health information.  Furthermore, I acknowledge that it is my responsibility to provide information about my medical history, conditions and care that is complete and accurate to the best of my ability. I acknowledge that Practitioner's advice, recommendations, and/or decision may be based on factors not within their control, such as incomplete or inaccurate data provided by me or distortions of diagnostic images or specimens that may result from electronic transmissions. I understand that the practice of medicine is not an exact science and that Practitioner makes no warranties or guarantees regarding treatment outcomes. I acknowledge that I will receive a copy of this consent concurrently upon execution via email to the email address I last provided but may also request a printed copy by calling the office of [facility].    I understand that my insurance will be billed for this visit.   I have read or had this consent read to me.  I understand the contents of this consent, which adequately explains the benefits and risks of the Services being provided via telemedicine.   I have been provided  ample opportunity to ask questions regarding this consent and the Services and have had my questions answered to my satisfaction.  I give my informed consent for the services to be provided through the use of telemedicine in my medical care  By participating in this telemedicine visit I agree to the above.

## 2018-12-17 NOTE — Progress Notes (Addendum)
[name] was evaluated at bedside.  She states that she was "feeling horrible" after her cardiac catheterization.  She reports chest pain and shortness of breath which lasted for several hours and completely resolved around 12 PM today.  She does not currently have any chest pain or shortness of breath.  She denies any other symptoms including nausea, vomiting, and palpitations.    Blood pressure (!) 146/94, pulse 79, temperature 99.1 F (37.3 C), temperature source Oral, resp. rate 18, height 5\' 3"  (1.6 m), weight 80.1 kg, SpO2 98 %. General: Sitting up in bed in no acute distress Cardiovascular: Heart rate in the 90s, normal rhythm Respiratory: Clear to auscultation bilaterally, normal work of breathing, normal oxygen saturations on room air  Assessment/plan: She had 2 troponins today which were 0.26 and 7.57.  EKG significant for diffuse T wave inversions and prolonged PR interval.  No ST elevations or depressions.  He is currently chest pain free and denies shortness of breath.  Cardiology consulted and recommended trending troponins.  They also increased her beta-blocker to metoprolol 25 mg twice daily.  ADDENDUM: Repeat trop @0100  down to 6.63. Will order one more troponin.

## 2018-12-18 ENCOUNTER — Telehealth: Payer: Self-pay | Admitting: Radiology

## 2018-12-18 NOTE — Telephone Encounter (Signed)
Enrolled patient for a 30 day [employer] Event Monitor to be mailed. Brief instructions were gone over with the patient and she knows to expect the monitor to arrive in 2-3 days

## 2018-12-23 ENCOUNTER — Ambulatory Visit (INDEPENDENT_AMBULATORY_CARE_PROVIDER_SITE_OTHER): Payer: Self-pay

## 2018-12-23 DIAGNOSIS — R55 Syncope and collapse: Secondary | ICD-10-CM

## 2018-12-26 ENCOUNTER — Telehealth: Payer: Self-pay | Admitting: Physician Assistant

## 2018-12-26 NOTE — Telephone Encounter (Signed)
Mychart pending, smartphone, consent (verbal) pre reg complete 12/26/18 AF

## 2018-12-27 ENCOUNTER — Telehealth: Payer: Self-pay

## 2018-12-27 ENCOUNTER — Telehealth (INDEPENDENT_AMBULATORY_CARE_PROVIDER_SITE_OTHER): Payer: Self-pay | Admitting: Physician Assistant

## 2018-12-27 DIAGNOSIS — E785 Hyperlipidemia, unspecified: Secondary | ICD-10-CM

## 2018-12-27 DIAGNOSIS — I2510 Atherosclerotic heart disease of native coronary artery without angina pectoris: Secondary | ICD-10-CM

## 2018-12-27 DIAGNOSIS — E119 Type 2 diabetes mellitus without complications: Secondary | ICD-10-CM

## 2018-12-27 DIAGNOSIS — I10 Essential (primary) hypertension: Secondary | ICD-10-CM

## 2018-12-27 NOTE — Telephone Encounter (Signed)
Virtual Visit Pre-Appointment Phone Call  "(Name), I am calling you today to discuss your upcoming appointment. We are currently trying to limit exposure to the virus that causes COVID-19 by seeing patients at home rather than in the office."  1. "What is the BEST phone number to call the day of the visit?" - include this in appointment notes  2. "Do you have or have access to (through a family member/friend) a smartphone with video capability that we can use for your visit?" a. If yes - list this number in appt notes as "cell" (if different from BEST phone #) and list the appointment type as a VIDEO visit in appointment notes b. If no - list the appointment type as a PHONE visit in appointment notes  3. Confirm consent - "In the setting of the current Covid19 crisis, you are scheduled for a PHONE visit with your provider on 12/27/2018 at 1:00PM.  Just as we do with many in-office visits, in order for you to participate in this visit, we must obtain consent.  If you'd like, I can send this to your mychart (if signed up) or email for you to review.  Otherwise, I can obtain your verbal consent now.  All virtual visits are billed to your insurance company just like a normal visit would be.  By agreeing to a virtual visit, we'd like you to understand that the technology does not allow for your provider to perform an examination, and thus may limit your provider's ability to fully assess your condition. If your provider identifies any concerns that need to be evaluated in person, we will make arrangements to do so.  Finally, though the technology is pretty good, we cannot assure that it will always work on either your or our end, and in the setting of a video visit, we may have to convert it to a phone-only visit.  In either situation, we cannot ensure that we have a secure connection.  Are you willing to proceed?" STAFF: Did the patient verbally acknowledge consent to telehealth visit? Document YES/NO  here: YES  4. Advise patient to be prepared - "Two hours prior to your appointment, go ahead and check your blood pressure, pulse, oxygen saturation, and your weight (if you have the equipment to check those) and write them all down. When your visit starts, your provider will ask you for this information. If you have an Apple Watch or Kardia device, please plan to have heart rate information ready on the day of your appointment. Please have a pen and paper handy nearby the day of the visit as well."  5. Give patient instructions for MyChart download to smartphone OR Doximity/Doxy.me as below if video visit (depending on what platform provider is using)  6. Inform patient they will receive a phone call 15 minutes prior to their appointment time (may be from unknown caller ID) so they should be prepared to answer    TELEPHONE CALL NOTE  Krista [name] has been deemed a candidate for a follow-up tele-health visit to limit community exposure during the Covid-19 pandemic. I spoke with the patient via phone to ensure availability of phone/video source, confirm preferred email & phone number, and discuss instructions and expectations.  I reminded Krista [name] to be prepared with any vital sign and/or heart rhythm information that could potentially be obtained via home monitoring, at the time of her visit. I reminded Krista [name] to expect a phone call prior to her visit.  Jacqulynn Cadet, CMA 12/27/2018 1:56 PM   INSTRUCTIONS FOR DOWNLOADING THE MYCHART APP TO SMARTPHONE  - The patient must first make sure to have activated MyChart and know their login information - If Apple, go to [employer] and type in MyChart in the search bar and download the app. If Android, ask patient to go to [employer] and type in [place] in the search bar and download the app. The app is free but as with any other app downloads, their phone may require them to verify saved payment information or Apple/Android  password.  - The patient will need to then log into the app with their MyChart username and password, and select [facility] as their healthcare provider to link the account. When it is time for your visit, go to the MyChart app, find appointments, and click Begin Video Visit. Be sure to Select Allow for your device to access the Microphone and Camera for your visit. You will then be connected, and your provider will be with you shortly.  **If they have any issues connecting, or need assistance please contact MyChart service desk [phone])**  **If using a computer, in order to ensure the best quality for their visit they will need to use either of the following Internet Browsers: [employer], or Google Chrome**  IF USING DOXIMITY or DOXY.ME - The patient will receive a link just prior to their visit by text.     FULL LENGTH CONSENT FOR TELE-HEALTH VISIT   I hereby voluntarily request, consent and authorize [facility] and its employed or contracted physicians, physician assistants, nurse practitioners or other licensed health care professionals (the Practitioner), to provide me with telemedicine health care services (the "Services") as deemed necessary by the treating Practitioner. I acknowledge and consent to receive the Services by the Practitioner via telemedicine. I understand that the telemedicine visit will involve communicating with the Practitioner through live audiovisual communication technology and the disclosure of certain medical information by electronic transmission. I acknowledge that I have been given the opportunity to request an in-person assessment or other available alternative prior to the telemedicine visit and am voluntarily participating in the telemedicine visit.  I understand that I have the right to withhold or withdraw my consent to the use of telemedicine in the course of my care at any time, without affecting my right to future care or treatment,  and that the Practitioner or I may terminate the telemedicine visit at any time. I understand that I have the right to inspect all information obtained and/or recorded in the course of the telemedicine visit and may receive copies of available information for a reasonable fee.  I understand that some of the potential risks of receiving the Services via telemedicine include:  [name] Delay or interruption in medical evaluation due to technological equipment failure or disruption; . Information transmitted may not be sufficient (e.g. poor resolution of images) to allow for appropriate medical decision making by the Practitioner; and/or  . In rare instances, security protocols could fail, causing a breach of personal health information.  Furthermore, I acknowledge that it is my responsibility to provide information about my medical history, conditions and care that is complete and accurate to the best of my ability. I acknowledge that Practitioner's advice, recommendations, and/or decision may be based on factors not within their control, such as incomplete or inaccurate data provided by me or distortions of diagnostic images or specimens that may result from electronic transmissions. I understand that the  practice of medicine is not an Chief Strategy Officer and that Practitioner makes no warranties or guarantees regarding treatment outcomes. I acknowledge that I will receive a copy of this consent concurrently upon execution via email to the email address I last provided but may also request a printed copy by calling the office of [facility].    I understand that my insurance will be billed for this visit.   I have read or had this consent read to me. . I understand the contents of this consent, which adequately explains the benefits and risks of the Services being provided via telemedicine.  . I have been provided ample opportunity to ask questions regarding this consent and the Services and have had my questions  answered to my satisfaction. . I give my informed consent for the services to be provided through the use of telemedicine in my medical care  By participating in this telemedicine visit I agree to the above.

## 2018-12-27 NOTE — Patient Instructions (Addendum)
Medication Instructions:   Your physician recommends that you continue on your current medications as directed. Please refer to the Current Medication list given to you today.  If you need a refill on your cardiac medications before your next appointment, please call your pharmacy.   Lab work:  You will need to come into the office in 6 weeks to have labs (blood work) drawn:  Fasting Lipid Liver Function   If you have labs (blood work) drawn today and your tests are completely normal, you will receive your results only by: [name] MyChart Message (if you have MyChart) OR . A paper copy in the mail If you have any lab test that is abnormal or we need to change your treatment, we will call you to review the results.  Testing/Procedures:  NONE ordered at this time of the appointment   Follow-Up: At [facility], you and your health needs are our priority.  As part of our continuing mission to provide you with exceptional heart care, we have created designated Provider Care Teams.  These Care Teams include your primary Cardiologist (physician) and Advanced Practice Providers (APPs -  Physician Assistants and Nurse Practitioners) who all work together to provide you with the care you need, when you need it. You will need a follow up appointment in 6 weeks.  Please call our office 2 months in advance to schedule this appointment.  You may see Quay Burow, MD or one of the following Advanced Practice Providers on your designated Care Team:   Kerin Ransom, PA-C Roby Lofts, [place] . Sande Rives, PA-C  Any Other Special Instructions Will Be Listed Below (If Applicable).

## 2018-12-27 NOTE — Progress Notes (Signed)
Virtual Visit via Telephone Note   This visit type was conducted due to national recommendations for restrictions regarding the COVID-19 Pandemic (e.g. social distancing) in an effort to limit this patient's exposure and mitigate transmission in our community.  Due to her co-morbid illnesses, this patient is at least at moderate risk for complications without adequate follow up.  This format is felt to be most appropriate for this patient at this time.  The patient did not have access to video technology/had technical difficulties with video requiring transitioning to audio format only (telephone).  All issues noted in this document were discussed and addressed.  No physical exam could be performed with this format.  Please refer to the patient's chart for her  consent to telehealth for [facility].   Date:  12/27/2018   ID:  Krista [name], DOB [birth year], MRN [id number]  Patient Location: Home Provider Location: Home  PCP:  Mardi Mainland, FNP  Cardiologist:  Quay Burow, MD  Electrophysiologist:  None   Evaluation Performed:  Follow-Up Visit  Chief Complaint:  Post hospital followup  History of Present Illness:    Krista [name] is a 58 y.o. female with past medical history of hypertension, tobacco abuse, and DM2 who was recently admitted on 12/13/2018 with sudden onset of dizziness, shortness of breath and palpitation.  She also complained of tightness in the throat.  Apparently patient did pass out and symptoms suggest a either vagal event or arrhythmia.  Cardiology was consulted for abnormal EKG which showed anterolateral T wave inversion.  Initial echocardiogram obtained on 12/14/2018 showed EF 50 to 55%, hypokinesis in the distal inferior and inferolateral wall, no significant valvular issue.  Given abnormal EKG and wall motion abnormality seen on echocardiogram, patient ultimately underwent cardiac catheterization on 12/16/2018 which revealed a severe 99% proximal to mid  LAD successfully treated with 2.7 x 20 mm Synergy DES, normal LVEDP, normal EF.  She also had 60% proximal RCA lesion, 65% RPDA lesion, 85% ostial ramus lesion.  It was recommended to manage the RCA disease medically she had diffuse disease which may require extensive amount of stents if PCI is needed.  Postprocedure, patient was placed on aspirin and Brilinta with recommendation to continue dual antiplatelet therapy for at least 1 year.  During the hospitalization, troponin peaked at 7.57 before trending back down.  Obtained during this hospitalization showed LDL 112, normal cholesterol, HDL and triglyceride.  She was also placed on high-dose statin.  Given her syncope, no driving for 6 months was recommended.  She was discharged with plan to proceed with outpatient event monitor.  Patient was contacted today via telephone visit.  She has already picked up the 30-day event monitor on the currently wearing it.  She denies any recent exertional chest pain or shortness of breath.  We have discussed the importance of compliance with aspirin or Brilinta.  She has quit smoking since the recent stent placement.  I congratulated her on this.  She does not have insurance, we will start with Brilinta medication assistance program, however I will also send a message to Dr. Gwenlyn Found in case she does not qualify for the medication assistance program to see if she can be switched to Plavix instead.  She does not think she can pay $400 for medication on a monthly basis.  Otherwise she has not had any dizziness, presyncope or syncope since discharge.  She will need a fasting lipid panel and LFT in 6 weeks and follow-up with Dr.  Alvester Chou in 6 weeks.   The patient does not have symptoms concerning for COVID-19 infection (fever, chills, cough, or new shortness of breath).    Past Medical History:  Diagnosis Date   Arthritis    Depression    Diabetes mellitus    Hypertension    Past Surgical History:  Procedure  Laterality Date   ANKLE SURGERY     left ankle - was broken   CORONARY STENT INTERVENTION N/A 12/16/2018   Procedure: CORONARY STENT INTERVENTION;  Surgeon: Leonie Man, MD;  Location: [facility];  Service: Cardiovascular;  Laterality: N/A;  LAD   LEFT HEART CATH AND CORONARY ANGIOGRAPHY N/A 12/16/2018   Procedure: LEFT HEART CATH AND CORONARY ANGIOGRAPHY;  Surgeon: Leonie Man, MD;  Location: [facility];  Service: Cardiovascular;  Laterality: N/A;     Current Meds  Medication Sig   aspirin EC 81 MG EC tablet Take 1 tablet (81 mg total) by mouth daily.   atorvastatin (LIPITOR) 80 MG tablet Take 1 tablet (80 mg total) by mouth daily at 6 PM.   lisinopril (ZESTRIL) 2.5 MG tablet Take 1 tablet (2.5 mg total) by mouth daily.   loratadine (CLARITIN) 10 MG tablet Take 10 mg by mouth daily.   metFORMIN (GLUCOPHAGE) 500 MG tablet Take 500 mg by mouth 2 (two) times daily with a meal.    metoprolol tartrate (LOPRESSOR) 25 MG tablet Take 1 tablet (25 mg total) by mouth 2 (two) times daily.   naproxen (NAPROSYN) 500 MG tablet Take 500 mg by mouth at bedtime.   nitroGLYCERIN (NITROSTAT) 0.4 MG SL tablet Place 1 tablet (0.4 mg total) under the tongue every 5 (five) minutes as needed for chest pain.   ticagrelor (BRILINTA) 90 MG TABS tablet Take 1 tablet (90 mg total) by mouth 2 (two) times daily.     Allergies:   Patient has no known allergies.   Social History   Tobacco Use   Smoking status: Current Every Day Smoker    Packs/day: 0.50    Years: 33.00    Pack years: 16.50    Types: Cigarettes   Smokeless tobacco: Never Used  Substance Use Topics   Alcohol use: No   Drug use: No     Family Hx: The patient's family history includes COPD in her mother; Diabetes in her father and mother; Hypertension in her father and mother.  ROS:   Please see the history of present illness.     All other systems reviewed and are negative.   Prior CV studies:   The  following studies were reviewed today:  Echo 12/14/2018 IMPRESSIONS   1. The left ventricle has low normal systolic function, with an ejection fraction of 50-55%. The cavity size was normal. Left ventricular diastolic Doppler parameters are indeterminate.  2. LVEF is approximately 50 to 55% with hypokinesis of the dista/akiensis of the distal infeiror, inferolateral wallsl.  3. The right ventricle has normal systolic function. The cavity was normal. There is no increase in right ventricular wall thickness.  4. Trivial pericardial effusion is present.  5. The aortic valve is tricuspid. Mild thickening of the aortic valve. Aortic valve regurgitation is trivial by color flow Doppler.   Cath 12/16/2018  Prox LAD lesion is 99% stenosed with 99% stenosed side branch in Ost 1st Diag.  A drug-eluting stent was successfully placed using a STENT SYNERGY DES 2.75X20. Postdilated to 3.1 mm  Post intervention, there is a 0% residual stenosis.  Post  intervention, the side branch was reduced to 90% residual stenosis with downstream 80% stenosis. Plan is to treat this medically.  ------------------------------------------------------  Prox RCA lesion is 60% stenosed. Mid RCA lesion is 35% stenosed. Mid RCA to Dist RCA lesion is 55% stenosed. RPDA lesion is 65% stenosed.  Ost Ramus to Ramus lesion is 45% stenosed.  The left ventricular systolic function is normal. The left ventricular ejection fraction is 50-55% by visual estimate. -Apical hypokinesis  ==============================   SUMMARY  Severe CAD with severe 99% proximal-mid LAD at takeoff of 1st Diag & Septal branches associated with severe 99% ostial stenosis in both branches (1st Diag is a relatively small caliber vessel and diffusely diseased), diffuse moderate to severe 55 to 65% stenoses in proximal and mid RCA as well as RPDA.  Successful DES PCI of LAD lesion with synergy DES 2.7 mm x 20 mm (3.1 mm) preserving diagonal and septal  flow.  Preserved LVEF with apparent apical hypokinesis consistent with echocardiogram findings  Normal LVEDP  RECOMMENDATIONS  Return patient to nursing unit for ongoing care.  Anticipate potential discharge either later on this afternoon after bedrest versus tomorrow morning pending clinical evaluation.  Would recommend aggressive risk factor management modification: High-dose statin, smoking cessation counseling, close glycemic control, close blood pressure control using ACE inhibitor/ARB and beta-blocker if tolerated  For now plan will be to treat the RCA lesions medically.  Given the diffuse nature of disease, there would be extensive amount of stent uses to cover the entire diseased segment.  Would reevaluate symptoms once she has recovered from this acute event.  Dual antiplatelet coverage for minimum 1 year  Labs/Other Tests and Data Reviewed:    EKG:  An ECG dated 12/17/2018 was personally reviewed today and demonstrated:  NSR with diffuse TWI  Recent Labs: 12/13/2018: Magnesium 1.9 12/14/2018: ALT 10; TSH 1.149 12/17/2018: BUN 15; Creatinine, Ser 0.82; Hemoglobin 13.3; Platelets 286; Potassium 3.8; Sodium 136   Recent Lipid Panel Lab Results  Component Value Date/Time   CHOL 167 12/14/2018 01:34 AM   TRIG 72 12/14/2018 01:34 AM   HDL 41 12/14/2018 01:34 AM   CHOLHDL 4.1 12/14/2018 01:34 AM   LDLCALC 112 (H) 12/14/2018 01:34 AM    Wt Readings from Last 3 Encounters:  12/17/18 175 lb 1.6 oz (79.4 kg)  08/01/17 180 lb (81.6 kg)  08/02/12 176 lb 9.4 oz (80.1 kg)     Objective:    Vital Signs:  There were no vitals taken for this visit.   VITAL SIGNS:  reviewed  ASSESSMENT & PLAN:    1. CAD: Continue aspirin and Brilinta.  Unfortunately patient does not have insurance and may not be able to afford Brilinta.  I will discuss with Dr. Alvester Chou regarding switching her to Plavix instead.  2. Hypertension: Unfortunately she was unable to provide me with any vital signs today.   We will continue on current therapy.  3. Hyperlipidemia: Continue high-dose statin.  Lipid panel and LFT in 6 weeks.  4. DM 2: Managed by primary care provider  COVID-19 Education: The signs and symptoms of COVID-19 were discussed with the patient and how to seek care for testing (follow up with PCP or arrange E-visit).  The importance of social distancing was discussed today.  Time:   Today, I have spent 18 minutes with the patient with telehealth technology discussing the above problems.     Medication Adjustments/Labs and Tests Ordered: Current medicines are reviewed at length with the patient today.  Concerns  regarding medicines are outlined above.   Tests Ordered: No orders of the defined types were placed in this encounter.   Medication Changes: No orders of the defined types were placed in this encounter.   Disposition:  Follow up in 6 week(s)  Signed, Almyra Deforest, PA  12/27/2018 1:12 PM    [facility]

## 2018-12-30 ENCOUNTER — Telehealth (HOSPITAL_COMMUNITY): Payer: Self-pay | Admitting: *Deleted

## 2018-12-30 NOTE — Telephone Encounter (Signed)
Pt returned call to cardiac rehab.  Pt is exercising by walking 10 minutes twice a day.  Pt is adding one minute each day and understands the goal is 30 minutes twice a day.  Pt aware that these walks can be broken further down due to tolerance.  Pt has made many changes with diet and is eating more healthier.  Pt has continued to be tobacco free.  Pt lives with her mother and her father is in a nursing facility.  Pt received medicaid application in the mail and will begin to complete the process.  Pt is also applying for disability. Will send pt additional education material on exercise and nutrition with diabetes management. Pt appreciative of this.  Asked pt to please keep [place] up to date on the medicaid process.  Will send maintenance information in case she is unable to obtain insurance. Cherre Huger, BSN Cardiac and Training and development officer

## 2018-12-30 NOTE — Telephone Encounter (Signed)
Called and left message on preferred phone voicemail in regards to general well being and cardiac rehab referral.  Would like update on medicaid application process.  May need to consider maintenance program if unable to acquire insurance. Requested call back. Cherre Huger, BSN Cardiac and Training and development officer

## 2019-01-22 ENCOUNTER — Telehealth (HOSPITAL_COMMUNITY): Payer: Self-pay

## 2019-01-22 NOTE — Telephone Encounter (Signed)
Phoned pt regarding Virtual Cardiac Rehab. Pt states she is interested. Obtained verbal consent. Set up appointment for 01/24/2019 to set up app with pt via phone. Sent link invitation to pt's phone.   Carma Lair MS, ACSM CEP 12:51 PM 01/22/2019

## 2019-01-22 NOTE — Telephone Encounter (Signed)
Spoke with pt on phone regarding virtual cardiac rehab. Obtained consent and set up appointment to download Better He[employer] App.         Confirm Consent - In the setting of the current Covid19 crisis, you are scheduled for a phone visit with your Cardiac or Pulmonary team member.  Just as we do with many in-gym visits, in order for you to participate in this visit, we must obtain consent.  If you'd like, I can send this to your mychart (if signed up) or email for you to review.  Otherwise, I can obtain your verbal consent now.  By agreeing to a telephone visit, we'd like you to understand that the technology does not allow for your Cardiac or Pulmonary Rehab team member to perform a physical assessment, and thus may limit their ability to fully assess your ability to perform exercise programs. If your provider identifies any concerns that need to be evaluated in person, we will make arrangements to do so.  Finally, though the technology is pretty good, we cannot assure that it will always work on either your or our end and we cannot ensure that we have a secure connection.  Cardiac and Pulmonary Rehab Telehealth visits and "At Home" cardiac and pulmonary rehab are provided at no cost to you.        Are you willing to proceed?"        STAFF: Did the patient verbally acknowledge consent to telehealth visit? Document YES/NO here: YES     Carma Lair MS, ACSM CEP  Cardiac and Pulmonary Rehab Staff       01/22/2019 12:53 PM

## 2019-01-24 ENCOUNTER — Telehealth (HOSPITAL_COMMUNITY): Payer: Self-pay

## 2019-01-24 ENCOUNTER — Encounter (HOSPITAL_COMMUNITY): Payer: Self-pay

## 2019-01-24 NOTE — Telephone Encounter (Signed)
Phone call made to Pt to set up her Better Hearts app on her smart device for virtual CR. Pt stated she was not feeling well and wanted to reschedule for Monday at 11:00 am. Asked Pt if there was anything I could help her with and Pt denied. Pt appointment was rescheduled.

## 2019-01-27 ENCOUNTER — Encounter (HOSPITAL_COMMUNITY): Payer: Self-pay

## 2019-01-27 ENCOUNTER — Other Ambulatory Visit: Payer: Self-pay

## 2019-01-28 ENCOUNTER — Telehealth (HOSPITAL_COMMUNITY): Payer: Self-pay | Admitting: *Deleted

## 2019-01-28 ENCOUNTER — Encounter (HOSPITAL_COMMUNITY)
Admission: RE | Admit: 2019-01-28 | Discharge: 2019-01-28 | Disposition: A | Discharge status: Home / Self Care | Payer: Self-pay | Source: Ambulatory Visit | Attending: Cardiovascular Disease | Admitting: Cardiovascular Disease

## 2019-01-28 ENCOUNTER — Other Ambulatory Visit: Payer: Self-pay

## 2019-01-28 NOTE — Telephone Encounter (Signed)
Called and spoke to pt regarding Virtual Cardiac Rehab.  Pt  was able to download the Better He[employer] app on their smart device with no issues. Pt set up their account and received the following welcome message -"Welcome to the [facility] and Pulmonary Rehabilitation program. We hope that you will find the exercise program beneficial in your recovery process. Our staff is available to assist with any questions/concerns about your exercise routine. Best wishes". Brief orientation provided to with the advisement to watch the "Intro to Rehab" series located under the Resource tab. Pt verbalized understanding. Will continue to follow and monitor pt progress with feedback as needed.Barnet Pall, RN,BSN 01/28/2019 11:30 AM

## 2019-01-30 ENCOUNTER — Telehealth (HOSPITAL_COMMUNITY): Payer: Self-pay

## 2019-01-30 NOTE — Telephone Encounter (Signed)
Called pt to discuss heart healthy eating. Pt shared today was not a good day. Set up more convenient appointment for nutrition education with patient Friday 01/31/19 at 2 pm.  Laurina Bustle MS RD LDN 01/30/19 1:27pm

## 2019-01-31 ENCOUNTER — Encounter (HOSPITAL_COMMUNITY)
Admission: RE | Admit: 2019-01-31 | Discharge: 2019-01-31 | Disposition: A | Discharge status: Home / Self Care | Payer: Self-pay | Source: Ambulatory Visit | Attending: Cardiovascular Disease | Admitting: Cardiovascular Disease

## 2019-01-31 ENCOUNTER — Telehealth (HOSPITAL_COMMUNITY): Payer: Self-pay

## 2019-01-31 NOTE — Telephone Encounter (Signed)
Called pt to discuss heart healthy eating. Recommended pt make the following heart healthy changes a permanent part of dietary pattern:  1.Eat a balanced diet with whole grains, fruits, vegetables, lean protein sources.  2.Choose heart healthy unsaturated fats. Limit saturated fats, trans fats. Eat more plant based meals using beans and soy foods for protein. 3. Eat whole unprocessed foods to limit the amount of sodium (salt) consumed. 4. Choose a consistent amunt of carbohydrates at each meal and snack.  5. Limit refined carbohydrates especially sugar, sweets, and sugar-sweetened beverages. 6. Limit alcohol consumption.  Patient was engaged in discussion today, and open to trying dietitian recommendations. Praised pt for open mindedness and willingness to try new suggestions, encouraged pt to keep up the good work.   Spent 40 minutes discussing heart heatlhy eating with patient today   Laurina Bustle, MS, RD, LDN 01/31/2019 4:10 PM

## 2019-02-04 ENCOUNTER — Telehealth: Payer: Self-pay

## 2019-02-04 NOTE — Telephone Encounter (Signed)
Called pt an informed that Dr. Gwenlyn Found will be in office on 7/1. Appointment changed back to in office visit. Pt made aware.

## 2019-02-07 ENCOUNTER — Telehealth: Payer: Self-pay | Admitting: *Deleted

## 2019-02-07 ENCOUNTER — Other Ambulatory Visit (HOSPITAL_COMMUNITY)
Admission: AD | Admit: 2019-02-07 | Discharge: 2019-02-07 | Disposition: A | Discharge status: Home / Self Care | Payer: Self-pay | Source: Ambulatory Visit | Attending: Cardiovascular Disease | Admitting: Cardiovascular Disease

## 2019-02-07 NOTE — Telephone Encounter (Signed)
Call placed to pt re: monitor results, left a message for her to call back.

## 2019-02-07 NOTE — Telephone Encounter (Signed)
-----   Message from [place], [place] sent at 01/31/2019  8:09 AM EDT ----- No arrhyhtmias on monitor. Follow up with Dr. Gwenlyn Found as schedule.

## 2019-02-10 LAB — HEPATIC FUNCTION PANEL
ALT: 22 IU/L (ref 0–32)
AST: 16 IU/L (ref 0–40)
Albumin: 4.1 g/dL (ref 3.8–4.9)
Alkaline Phosphatase: 61 IU/L (ref 39–117)
Bilirubin Total: 0.6 mg/dL (ref 0.0–1.2)
Bilirubin, Direct: 0.16 mg/dL (ref 0.00–0.40)
Total Protein: 7 g/dL (ref 6.0–8.5)

## 2019-02-10 LAB — LIPID PANEL
Chol/HDL Ratio: 2.5 ratio (ref 0.0–4.4)
Cholesterol, Total: 91 mg/dL — ABNORMAL LOW (ref 100–199)
HDL: 37 mg/dL — ABNORMAL LOW (ref >39)
LDL Calculated: 38 mg/dL (ref 0–99)
Triglycerides: 79 mg/dL (ref 0–149)
VLDL Cholesterol Cal: 16 mg/dL (ref 5–40)

## 2019-02-11 ENCOUNTER — Telehealth (HOSPITAL_COMMUNITY): Payer: Self-pay

## 2019-02-11 NOTE — Telephone Encounter (Signed)
Contacted Pt about logging SOB in the virtual CR app on 02/10/19. Pt stated she was short of breath during her walk. Pt stated the SOB resolved after resting.Encouraged Pt to use deep breathing technique while exercising. Pt stated she had an appointment with Dr.Berry tomorrow 02/12/19. Encouraged Pt to let him know of her shortness of breath. Will follow up with Pt after visit.

## 2019-02-12 ENCOUNTER — Encounter: Payer: Self-pay | Admitting: *Deleted

## 2019-02-12 ENCOUNTER — Ambulatory Visit (INDEPENDENT_AMBULATORY_CARE_PROVIDER_SITE_OTHER): Payer: Self-pay | Admitting: Cardiovascular Disease

## 2019-02-12 ENCOUNTER — Other Ambulatory Visit: Payer: Self-pay

## 2019-02-12 ENCOUNTER — Encounter: Payer: Self-pay | Admitting: Cardiovascular Disease

## 2019-02-12 DIAGNOSIS — I25110 Atherosclerotic heart disease of native coronary artery with unstable angina pectoris: Secondary | ICD-10-CM

## 2019-02-12 DIAGNOSIS — E782 Mixed hyperlipidemia: Secondary | ICD-10-CM

## 2019-02-12 DIAGNOSIS — E785 Hyperlipidemia, unspecified: Secondary | ICD-10-CM | POA: Insufficient documentation

## 2019-02-12 DIAGNOSIS — I2510 Atherosclerotic heart disease of native coronary artery without angina pectoris: Secondary | ICD-10-CM | POA: Insufficient documentation

## 2019-02-12 DIAGNOSIS — I10 Essential (primary) hypertension: Secondary | ICD-10-CM

## 2019-02-12 DIAGNOSIS — Z720 Tobacco use: Secondary | ICD-10-CM

## 2019-02-12 MED ORDER — CLOPIDOGREL BISULFATE 75 MG PO TABS: 75.0000 mg | ORAL_TABLET | Freq: Every day | ORAL | 3 refills | Status: DC

## 2019-02-12 MED ORDER — LISINOPRIL 10 MG PO TABS: 10.0000 mg | ORAL_TABLET | Freq: Every day | ORAL | 3 refills | Status: DC

## 2019-02-12 MED ORDER — NITROGLYCERIN 0.4 MG SL SUBL: 0.4000 mg | SUBLINGUAL_TABLET | SUBLINGUAL | 1 refills | Status: DC | PRN

## 2019-02-12 MED ORDER — ATORVASTATIN CALCIUM 80 MG PO TABS: 80.0000 mg | ORAL_TABLET | Freq: Every day | ORAL | 3 refills | Status: DC

## 2019-02-12 MED ORDER — METOPROLOL TARTRATE 25 MG PO TABS: 25.0000 mg | ORAL_TABLET | Freq: Two times a day (BID) | ORAL | 3 refills | Status: DC

## 2019-02-12 NOTE — Assessment & Plan Note (Signed)
Smoking 4 to 5 cigarettes a day now with 40 to 50 pack history prior

## 2019-02-12 NOTE — Assessment & Plan Note (Signed)
History of CAD status post non-STEMI 12/13/2018 with cath performed 5/47/20 by Dr. Ellyn Hack revealing high-grade proximal LAD disease.  She underwent PCI and drug-eluting stenting with a synergy drug-eluting stent.  She had residual diagonal branch disease and moderate RCA disease which was treated medically.  She has been on aspirin and Brilinta however Brilinta is too costly for her.  We will try to get her samples for a month and then transition to Plavix.

## 2019-02-12 NOTE — Progress Notes (Signed)
02/12/2019 Krista [name]   1961-07-12  [id number]  Primary Physician Mardi Mainland, FNP Primary Cardiologist: Lorretta Harp MD FACP, [place], [place], [place]  HPI:  Krista [name] is a 58 y.o. 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  She returns today for her second post hospital visit having seen Almyra Deforest, PA-C on 12/27/2018, 10 days post discharge.  Her risk factors include tobacco abuse, treated hypertension, diabetes and hyperlipidemia.  She presented on 12/13/2018 with a non-STEMI and underwent cardiac catheterization 3 days later by Dr. Ellyn Hack revealing high-grade proximal LAD disease which was stented with a synergy drug-eluting stent.  She did have high-grade diffuse diagonal branch disease, ramus branch and RCA disease all of which were treated medically.  She was discharged home the following day on dual antiplatelet therapy including aspirin and Brilinta and has remained pain-free since that time although she does continue to smoke.   Current Meds  Medication Sig   aspirin EC 81 MG EC tablet Take 1 tablet (81 mg total) by mouth daily.   atorvastatin (LIPITOR) 80 MG tablet Take 1 tablet (80 mg total) by mouth daily at 6 PM.   lisinopril (ZESTRIL) 2.5 MG tablet Take 1 tablet (2.5 mg total) by mouth daily.   loratadine (CLARITIN) 10 MG tablet Take 10 mg by mouth daily.   metFORMIN (GLUCOPHAGE) 500 MG tablet Take 500 mg by mouth 2 (two) times daily with a meal.    metoprolol tartrate (LOPRESSOR) 25 MG tablet Take 1 tablet (25 mg total) by mouth 2 (two) times daily.   naproxen (NAPROSYN) 500 MG tablet Take 500 mg by mouth at bedtime.   nitroGLYCERIN (NITROSTAT) 0.4 MG SL tablet Place 1 tablet (0.4 mg total) under the tongue every 5 (five) minutes as needed for chest pain.   ticagrelor (BRILINTA) 90 MG TABS tablet Take 1 tablet (90 mg total) by mouth 2 (two) times  daily.     No Known Allergies  Social History   Socioeconomic History   Marital status: Single    Spouse name: Not on file   Number of children: Not on file   Years of education: Not on file   Highest education level: Not on file  Occupational History   Not on file  Social Needs   Financial resource strain: Not on file   Food insecurity    Worry: Not on file    Inability: Not on file   Transportation needs    Medical: Not on file    Non-medical: Not on file  Tobacco Use   Smoking status: Current Every Day Smoker    Packs/day: 0.50    Years: 33.00    Pack years: 16.50    Types: Cigarettes   Smokeless tobacco: Never Used  Substance and Sexual Activity   Alcohol use: No   Drug use: No   Sexual activity: Yes    Birth control/protection: Post-menopausal  Lifestyle   Physical activity    Days per week: Not on file    Minutes per session: Not on file   Stress: Not on file  Relationships   Social connections    Talks on phone: Not on file    Gets together: Not on file    Attends religious service: Not on file    Active member of club or organization: Not on file    Attends meetings of clubs or organizations: Not  on file    Relationship status: Not on file   Intimate partner violence    Fear of current or ex partner: Not on file    Emotionally abused: Not on file    Physically abused: Not on file    Forced sexual activity: Not on file  Other Topics Concern   Not on file  Social History Narrative   Not on file     Review of Systems: General: negative for chills, fever, night sweats or weight changes.  Cardiovascular: negative for chest pain, dyspnea on exertion, edema, orthopnea, palpitations, paroxysmal nocturnal dyspnea or shortness of breath Dermatological: negative for rash Respiratory: negative for cough or wheezing Urologic: negative for hematuria Abdominal: negative for nausea, vomiting, diarrhea, bright red blood per rectum, melena, or  hematemesis Neurologic: negative for visual changes, syncope, or dizziness All other systems reviewed and are otherwise negative except as noted above.    Blood pressure (!) 144/96, pulse 71, temperature 97.7 F (36.5 C), height 5\' 2"  (1.575 m), weight 180 lb (81.6 kg).  General appearance: alert and no distress Neck: no adenopathy, no carotid bruit, no JVD, supple, symmetrical, trachea midline and thyroid not enlarged, symmetric, no tenderness/mass/nodules Lungs: clear to auscultation bilaterally Heart: regular rate and rhythm, S1, S2 normal, no murmur, click, rub or gallop Extremities: extremities normal, atraumatic, no cyanosis or edema Pulses: 2+ and symmetric Skin: Skin color, texture, turgor normal. No rashes or lesions Neurologic: Alert and oriented X 3, normal strength and tone. Normal symmetric reflexes. Normal coordination and gait  EKG not performed today  ASSESSMENT AND PLAN:   HTN (hypertension) History of essential hypertension with blood pressure measured today at 144/96.  She is on low-dose lisinopril and metoprolol.  Tobacco abuse Smoking 4 to 5 cigarettes a day now with 40 to 50 pack history prior  Coronary artery disease History of CAD status post non-STEMI 12/13/2018 with cath performed 5/47/20 by Dr. Ellyn Hack revealing high-grade proximal LAD disease.  She underwent PCI and drug-eluting stenting with a synergy drug-eluting stent.  She had residual diagonal branch disease and moderate RCA disease which was treated medically.  She has been on aspirin and Brilinta however Brilinta is too costly for her.  We will try to get her samples for a month and then transition to Plavix.  Hyperlipidemia History of hyperlipidemia on high-dose statin therapy.  We will recheck a lipid liver profile.      Lorretta Harp MD FACP,FACC,FAHA, [facility] 02/12/2019 4:09 PM

## 2019-02-12 NOTE — Assessment & Plan Note (Signed)
History of hyperlipidemia on high-dose statin therapy.  We will recheck a lipid liver profile. 

## 2019-02-12 NOTE — Assessment & Plan Note (Signed)
History of essential hypertension with blood pressure measured today at 144/96.  She is on low-dose lisinopril and metoprolol.

## 2019-02-12 NOTE — Patient Instructions (Addendum)
Medication Instructions:  Your physician has recommended you make the following change in your medication:  INCREASE YOUR LISINOPRIL TO 10 mg BY MOUTH DAILY  STOP TAKING YOUR BRILINTA TODAY.  TAKE A LOADING DOSE OF PLAVIX 600 MG (8 TABLETS) ON Thursday 02/13/2019. STARTING Friday 02/14/2019 TAKE PLAVIX, 1 TABLET (75MG ) ON A DAILY BASIS.   If you need a refill on your cardiac medications before your next appointment, please call your pharmacy.   Lab work: Your physician recommends that you return for lab work in [place]: [place]  If you have labs (blood work) drawn today and your tests are completely normal, you will receive your results only by: [name] MyChart Message (if you have MyChart) OR . A paper copy in the mail If you have any lab test that is abnormal or we need to change your treatment, we will call you to review the results.  Testing/Procedures: NONE  Follow-Up: At [facility], you and your health needs are our priority.  As part of our continuing mission to provide you with exceptional heart care, we have created designated Provider Care Teams.  These Care Teams include your primary Cardiologist (physician) and Advanced Practice Providers (APPs -  Physician Assistants and Nurse Practitioners) who all work together to provide you with the care you need, when you need it. You will need a follow up appointment in 3 months with Almyra Deforest, PA-C and in 6 months with Dr. Gwenlyn Found.  Please call our office 2 months in advance to schedule this appointment.

## 2019-02-26 LAB — LIPID PANEL
Chol/HDL Ratio: 2.8 ratio (ref 0.0–4.4)
Cholesterol, Total: 103 mg/dL (ref 100–199)
HDL: 37 mg/dL — ABNORMAL LOW (ref >39)
LDL Calculated: 49 mg/dL (ref 0–99)
Triglycerides: 84 mg/dL (ref 0–149)
VLDL Cholesterol Cal: 17 mg/dL (ref 5–40)

## 2019-02-26 LAB — HEPATIC FUNCTION PANEL
ALT: 21 IU/L (ref 0–32)
AST: 19 IU/L (ref 0–40)
Albumin: 4.1 g/dL (ref 3.8–4.9)
Alkaline Phosphatase: 63 IU/L (ref 39–117)
Bilirubin Total: 0.6 mg/dL (ref 0.0–1.2)
Bilirubin, Direct: 0.2 mg/dL (ref 0.00–0.40)
Total Protein: 6.7 g/dL (ref 6.0–8.5)

## 2019-02-26 LAB — BASIC METABOLIC PANEL
BUN/Creatinine Ratio: 22 (ref 9–23)
BUN: 17 mg/dL (ref 6–24)
CO2: 24 mmol/L (ref 20–29)
Calcium: 9.3 mg/dL (ref 8.7–10.2)
Chloride: 102 mmol/L (ref 96–106)
Creatinine, Ser: 0.77 mg/dL (ref 0.57–1.00)
GFR calc Af Amer: 101 mL/min/{1.73_m2} (ref >59)
GFR calc non Af Amer: 87 mL/min/{1.73_m2} (ref >59)
Glucose: 134 mg/dL — ABNORMAL HIGH (ref 65–99)
Potassium: 4.3 mmol/L (ref 3.5–5.2)
Sodium: 141 mmol/L (ref 134–144)

## 2019-03-25 ENCOUNTER — Encounter (HOSPITAL_COMMUNITY): Payer: Self-pay

## 2019-03-25 NOTE — Progress Notes (Signed)
Letter sent to Pt due to inactivity with virtual CR app. Pt has not been logging exercise. If Pt does not respond by 04/08/19 will be dropped from the virtual CR app.   Deitra Mayo BS, ACSM CEP 03/25/2019 9:14 AM

## 2019-05-13 ENCOUNTER — Inpatient Hospital Stay (HOSPITAL_COMMUNITY)
Admission: EM | Admit: 2019-05-13 | Discharge: 2019-05-16 | DRG: 682 | Disposition: A | Discharge status: Home Health | Payer: Medicaid Other | Attending: Family Medicine | Admitting: Family Medicine

## 2019-05-13 ENCOUNTER — Inpatient Hospital Stay (HOSPITAL_COMMUNITY): Payer: Medicaid Other

## 2019-05-13 ENCOUNTER — Emergency Department (HOSPITAL_COMMUNITY): Payer: Medicaid Other

## 2019-05-13 ENCOUNTER — Ambulatory Visit: Payer: Self-pay | Admitting: Physician Assistant

## 2019-05-13 ENCOUNTER — Other Ambulatory Visit: Payer: Self-pay

## 2019-05-13 ENCOUNTER — Encounter (HOSPITAL_COMMUNITY): Payer: Self-pay

## 2019-05-13 ENCOUNTER — Telehealth: Payer: Self-pay | Admitting: Cardiovascular Disease

## 2019-05-13 DIAGNOSIS — R509 Fever, unspecified: Secondary | ICD-10-CM | POA: Diagnosis not present

## 2019-05-13 DIAGNOSIS — M6282 Rhabdomyolysis: Secondary | ICD-10-CM | POA: Diagnosis present

## 2019-05-13 DIAGNOSIS — E860 Dehydration: Secondary | ICD-10-CM | POA: Diagnosis present

## 2019-05-13 DIAGNOSIS — R6510 Systemic inflammatory response syndrome (SIRS) of non-infectious origin without acute organ dysfunction: Secondary | ICD-10-CM | POA: Diagnosis present

## 2019-05-13 DIAGNOSIS — R7401 Elevation of levels of liver transaminase levels: Secondary | ICD-10-CM

## 2019-05-13 DIAGNOSIS — R072 Precordial pain: Secondary | ICD-10-CM

## 2019-05-13 DIAGNOSIS — R0600 Dyspnea, unspecified: Secondary | ICD-10-CM

## 2019-05-13 DIAGNOSIS — M1990 Unspecified osteoarthritis, unspecified site: Secondary | ICD-10-CM | POA: Diagnosis present

## 2019-05-13 DIAGNOSIS — A419 Sepsis, unspecified organism: Secondary | ICD-10-CM | POA: Diagnosis not present

## 2019-05-13 DIAGNOSIS — E785 Hyperlipidemia, unspecified: Secondary | ICD-10-CM | POA: Diagnosis present

## 2019-05-13 DIAGNOSIS — I25118 Atherosclerotic heart disease of native coronary artery with other forms of angina pectoris: Secondary | ICD-10-CM | POA: Diagnosis not present

## 2019-05-13 DIAGNOSIS — M25569 Pain in unspecified knee: Secondary | ICD-10-CM

## 2019-05-13 DIAGNOSIS — J9601 Acute respiratory failure with hypoxia: Secondary | ICD-10-CM | POA: Diagnosis present

## 2019-05-13 DIAGNOSIS — Z955 Presence of coronary angioplasty implant and graft: Secondary | ICD-10-CM | POA: Diagnosis not present

## 2019-05-13 DIAGNOSIS — I214 Non-ST elevation (NSTEMI) myocardial infarction: Secondary | ICD-10-CM

## 2019-05-13 DIAGNOSIS — Z7902 Long term (current) use of antithrombotics/antiplatelets: Secondary | ICD-10-CM

## 2019-05-13 DIAGNOSIS — Z825 Family history of asthma and other chronic lower respiratory diseases: Secondary | ICD-10-CM | POA: Diagnosis not present

## 2019-05-13 DIAGNOSIS — R6520 Severe sepsis without septic shock: Secondary | ICD-10-CM

## 2019-05-13 DIAGNOSIS — Z8249 Family history of ischemic heart disease and other diseases of the circulatory system: Secondary | ICD-10-CM | POA: Diagnosis not present

## 2019-05-13 DIAGNOSIS — T466X5A Adverse effect of antihyperlipidemic and antiarteriosclerotic drugs, initial encounter: Secondary | ICD-10-CM

## 2019-05-13 DIAGNOSIS — Z20828 Contact with and (suspected) exposure to other viral communicable diseases: Secondary | ICD-10-CM | POA: Diagnosis present

## 2019-05-13 DIAGNOSIS — E876 Hypokalemia: Secondary | ICD-10-CM | POA: Diagnosis not present

## 2019-05-13 DIAGNOSIS — R740 Nonspecific elevation of levels of transaminase and lactic acid dehydrogenase [LDH]: Secondary | ICD-10-CM | POA: Diagnosis not present

## 2019-05-13 DIAGNOSIS — E119 Type 2 diabetes mellitus without complications: Secondary | ICD-10-CM | POA: Diagnosis present

## 2019-05-13 DIAGNOSIS — Z7984 Long term (current) use of oral hypoglycemic drugs: Secondary | ICD-10-CM

## 2019-05-13 DIAGNOSIS — I2510 Atherosclerotic heart disease of native coronary artery without angina pectoris: Secondary | ICD-10-CM | POA: Diagnosis present

## 2019-05-13 DIAGNOSIS — I10 Essential (primary) hypertension: Secondary | ICD-10-CM | POA: Diagnosis present

## 2019-05-13 DIAGNOSIS — N179 Acute kidney failure, unspecified: Principal | ICD-10-CM | POA: Diagnosis present

## 2019-05-13 DIAGNOSIS — I361 Nonrheumatic tricuspid (valve) insufficiency: Secondary | ICD-10-CM

## 2019-05-13 DIAGNOSIS — Z23 Encounter for immunization: Secondary | ICD-10-CM | POA: Diagnosis not present

## 2019-05-13 DIAGNOSIS — Z833 Family history of diabetes mellitus: Secondary | ICD-10-CM | POA: Diagnosis not present

## 2019-05-13 DIAGNOSIS — F17210 Nicotine dependence, cigarettes, uncomplicated: Secondary | ICD-10-CM | POA: Diagnosis present

## 2019-05-13 DIAGNOSIS — I248 Other forms of acute ischemic heart disease: Secondary | ICD-10-CM | POA: Diagnosis not present

## 2019-05-13 DIAGNOSIS — R609 Edema, unspecified: Secondary | ICD-10-CM

## 2019-05-13 HISTORY — DX: Atherosclerotic heart disease of native coronary artery without angina pectoris: I25.10

## 2019-05-13 HISTORY — DX: Non-ST elevation (NSTEMI) myocardial infarction: I21.4

## 2019-05-13 LAB — TROPONIN I (HIGH SENSITIVITY)
Troponin I (High Sensitivity): 95 ng/L — ABNORMAL HIGH (ref <18)
Troponin I (High Sensitivity): 165 ng/L (ref <18)
Troponin I (High Sensitivity): 313 ng/L (ref <18)
Troponin I (High Sensitivity): 330 ng/L (ref <18)

## 2019-05-13 LAB — D-DIMER, QUANTITATIVE: D-Dimer, Quant: 2.78 ug/mL-FEU — ABNORMAL HIGH (ref 0.00–0.50)

## 2019-05-13 LAB — COMPREHENSIVE METABOLIC PANEL
ALT: 67 U/L — ABNORMAL HIGH (ref 0–44)
AST: 100 U/L — ABNORMAL HIGH (ref 15–41)
Albumin: 4.2 g/dL (ref 3.5–5.0)
Alkaline Phosphatase: 67 U/L (ref 38–126)
Anion gap: 16 — ABNORMAL HIGH (ref 5–15)
BUN: 21 mg/dL — ABNORMAL HIGH (ref 6–20)
CO2: 19 mmol/L — ABNORMAL LOW (ref 22–32)
Calcium: 9.6 mg/dL (ref 8.9–10.3)
Chloride: 109 mmol/L (ref 98–111)
Creatinine, Ser: 2.21 mg/dL — ABNORMAL HIGH (ref 0.44–1.00)
GFR calc Af Amer: 28 mL/min — ABNORMAL LOW (ref >60)
GFR calc non Af Amer: 24 mL/min — ABNORMAL LOW (ref >60)
Glucose, Bld: 293 mg/dL — ABNORMAL HIGH (ref 70–99)
Potassium: 3.9 mmol/L (ref 3.5–5.1)
Sodium: 144 mmol/L (ref 135–145)
Total Bilirubin: 1 mg/dL (ref 0.3–1.2)
Total Protein: 8 g/dL (ref 6.5–8.1)

## 2019-05-13 LAB — URINALYSIS, ROUTINE W REFLEX MICROSCOPIC
Bilirubin Urine: NEGATIVE
Glucose, UA: NEGATIVE mg/dL
Ketones, ur: NEGATIVE mg/dL
Nitrite: NEGATIVE
Protein, ur: 100 mg/dL — AB
Specific Gravity, Urine: 1.009 (ref 1.005–1.030)
pH: 6 (ref 5.0–8.0)

## 2019-05-13 LAB — CBC WITH DIFFERENTIAL/PLATELET
Abs Immature Granulocytes: 0.12 10*3/uL — ABNORMAL HIGH (ref 0.00–0.07)
Basophils Absolute: 0 10*3/uL (ref 0.0–0.1)
Basophils Relative: 0 %
Eosinophils Absolute: 0 10*3/uL (ref 0.0–0.5)
Eosinophils Relative: 0 %
HCT: 46.4 % — ABNORMAL HIGH (ref 36.0–46.0)
Hemoglobin: 15.2 g/dL — ABNORMAL HIGH (ref 12.0–15.0)
Immature Granulocytes: 1 %
Lymphocytes Relative: 11 %
Lymphs Abs: 1.4 10*3/uL (ref 0.7–4.0)
MCH: 28.3 pg (ref 26.0–34.0)
MCHC: 32.8 g/dL (ref 30.0–36.0)
MCV: 86.2 fL (ref 80.0–100.0)
Monocytes Absolute: 0.9 10*3/uL (ref 0.1–1.0)
Monocytes Relative: 7 %
Neutro Abs: 10.9 10*3/uL — ABNORMAL HIGH (ref 1.7–7.7)
Neutrophils Relative %: 81 %
Platelets: 343 10*3/uL (ref 150–400)
RBC: 5.38 MIL/uL — ABNORMAL HIGH (ref 3.87–5.11)
RDW: 15.3 % (ref 11.5–15.5)
WBC: 13.4 10*3/uL — ABNORMAL HIGH (ref 4.0–10.5)
nRBC: 0 % (ref 0.0–0.2)

## 2019-05-13 LAB — LACTIC ACID, PLASMA
Lactic Acid, Venous: 5.2 mmol/L (ref 0.5–1.9)
Lactic Acid, Venous: 2.4 mmol/L (ref 0.5–1.9)

## 2019-05-13 LAB — HEMOGLOBIN A1C
Hgb A1c MFr Bld: 8.4 % — ABNORMAL HIGH (ref 4.8–5.6)
Mean Plasma Glucose: 194.38 mg/dL

## 2019-05-13 LAB — CK: Total CK: 31787 U/L — ABNORMAL HIGH (ref 38–234)

## 2019-05-13 LAB — MRSA PCR SCREENING: MRSA by PCR: NEGATIVE

## 2019-05-13 LAB — I-STAT BETA HCG BLOOD, ED (MC, WL, AP ONLY): I-stat hCG, quantitative: <5 m[IU]/mL (ref <5)

## 2019-05-13 LAB — MAGNESIUM: Magnesium: 2 mg/dL (ref 1.7–2.4)

## 2019-05-13 LAB — CBG MONITORING, ED: Glucose-Capillary: 126 mg/dL — ABNORMAL HIGH (ref 70–99)

## 2019-05-13 LAB — SARS CORONAVIRUS 2 BY RT PCR (HOSPITAL ORDER, PERFORMED IN ~~LOC~~ HOSPITAL LAB): SARS Coronavirus 2: NEGATIVE

## 2019-05-13 LAB — PROTIME-INR
INR: 1.2 (ref 0.8–1.2)
Prothrombin Time: 15.1 seconds (ref 11.4–15.2)

## 2019-05-13 LAB — ECHOCARDIOGRAM COMPLETE
Height: 63 in
Weight: 2880 oz

## 2019-05-13 LAB — APTT: aPTT: 31 seconds (ref 24–36)

## 2019-05-13 MED ORDER — CLOPIDOGREL BISULFATE 75 MG PO TABS
75.0000 mg | ORAL_TABLET | Freq: Every day | ORAL | Status: DC
  Administered 2019-05-13 – 2019-05-16 (×4): 75 mg via ORAL
  Filled 2019-05-13 (×4): qty 1

## 2019-05-13 MED ORDER — VANCOMYCIN HCL IN DEXTROSE 1-5 GM/200ML-% IV SOLN: 1000.0000 mg | INTRAVENOUS | Status: DC

## 2019-05-13 MED ORDER — SENNA 8.6 MG PO TABS
1.0000 | ORAL_TABLET | Freq: Two times a day (BID) | ORAL | Status: DC
  Administered 2019-05-13 – 2019-05-14 (×2): 8.6 mg via ORAL
  Filled 2019-05-13 (×2): qty 1

## 2019-05-13 MED ORDER — VANCOMYCIN HCL 10 G IV SOLR
1750.0000 mg | Freq: Once | INTRAVENOUS | Status: AC
  Administered 2019-05-13: 1750 mg via INTRAVENOUS
  Filled 2019-05-13: qty 1750

## 2019-05-13 MED ORDER — INSULIN ASPART 100 UNIT/ML ~~LOC~~ SOLN
0.0000 [IU] | Freq: Three times a day (TID) | SUBCUTANEOUS | Status: DC
  Administered 2019-05-14: 2 [IU] via SUBCUTANEOUS
  Administered 2019-05-15 (×2): 3 [IU] via SUBCUTANEOUS
  Administered 2019-05-15 – 2019-05-16 (×2): 2 [IU] via SUBCUTANEOUS
  Administered 2019-05-16: 7 [IU] via SUBCUTANEOUS

## 2019-05-13 MED ORDER — HEPARIN SODIUM (PORCINE) 5000 UNIT/ML IJ SOLN
5000.0000 [IU] | Freq: Three times a day (TID) | INTRAMUSCULAR | Status: DC
  Administered 2019-05-13 – 2019-05-14 (×2): 5000 [IU] via SUBCUTANEOUS
  Filled 2019-05-13 (×2): qty 1

## 2019-05-13 MED ORDER — SODIUM CHLORIDE 0.9 % IV SOLN: 2.0000 g | INTRAVENOUS | Status: DC

## 2019-05-13 MED ORDER — ATORVASTATIN CALCIUM 80 MG PO TABS
80.0000 mg | ORAL_TABLET | Freq: Every day | ORAL | Status: DC
  Administered 2019-05-13: 80 mg via ORAL
  Filled 2019-05-13: qty 1

## 2019-05-13 MED ORDER — ACETAMINOPHEN 325 MG PO TABS
650.0000 mg | ORAL_TABLET | Freq: Four times a day (QID) | ORAL | Status: DC | PRN
  Administered 2019-05-14 – 2019-05-15 (×3): 650 mg via ORAL
  Filled 2019-05-13 (×3): qty 2

## 2019-05-13 MED ORDER — METOPROLOL TARTRATE 25 MG PO TABS
25.0000 mg | ORAL_TABLET | Freq: Two times a day (BID) | ORAL | Status: DC
  Administered 2019-05-13 – 2019-05-16 (×6): 25 mg via ORAL
  Filled 2019-05-13 (×6): qty 1

## 2019-05-13 MED ORDER — ACETAMINOPHEN 325 MG PO TABS
650.0000 mg | ORAL_TABLET | Freq: Once | ORAL | Status: AC
  Administered 2019-05-13: 650 mg via ORAL
  Filled 2019-05-13: qty 2

## 2019-05-13 MED ORDER — ATORVASTATIN CALCIUM 80 MG PO TABS: 80.0000 mg | ORAL_TABLET | Freq: Every day | ORAL | Status: DC

## 2019-05-13 MED ORDER — SODIUM CHLORIDE 0.9 % IV SOLN
INTRAVENOUS | Status: DC
  Administered 2019-05-13 – 2019-05-16 (×12): via INTRAVENOUS

## 2019-05-13 MED ORDER — ASPIRIN EC 81 MG PO TBEC
81.0000 mg | DELAYED_RELEASE_TABLET | Freq: Every day | ORAL | Status: DC
  Administered 2019-05-13 – 2019-05-16 (×4): 81 mg via ORAL
  Filled 2019-05-13 (×4): qty 1

## 2019-05-13 MED ORDER — SODIUM CHLORIDE 0.9 % IV SOLN
2.0000 g | INTRAVENOUS | Status: DC
  Administered 2019-05-13: 2 g via INTRAVENOUS
  Filled 2019-05-13: qty 20

## 2019-05-13 MED ORDER — TECHNETIUM TO 99M ALBUMIN AGGREGATED
1.6000 | Freq: Once | INTRAVENOUS | Status: AC | PRN
  Administered 2019-05-13: 1.6 via INTRAVENOUS

## 2019-05-13 MED ORDER — DOXYCYCLINE HYCLATE 100 MG PO TABS
100.0000 mg | ORAL_TABLET | Freq: Once | ORAL | Status: AC
  Administered 2019-05-13: 100 mg via ORAL
  Filled 2019-05-13: qty 1

## 2019-05-13 MED ORDER — SODIUM CHLORIDE 0.9 % IV SOLN
2.0000 g | Freq: Once | INTRAVENOUS | Status: AC
  Administered 2019-05-13: 2 g via INTRAVENOUS
  Filled 2019-05-13: qty 2

## 2019-05-13 MED ORDER — SODIUM CHLORIDE 0.9 % IV BOLUS
1000.0000 mL | Freq: Once | INTRAVENOUS | Status: AC
  Administered 2019-05-13: 1000 mL via INTRAVENOUS

## 2019-05-13 MED ORDER — SODIUM CHLORIDE 0.9 % IV SOLN: 2.0000 g | Freq: Once | INTRAVENOUS | Status: DC

## 2019-05-13 MED ORDER — SODIUM CHLORIDE 0.9 % IV BOLUS
1500.0000 mL | Freq: Once | INTRAVENOUS | Status: AC
  Administered 2019-05-13: 1500 mL via INTRAVENOUS

## 2019-05-13 MED ORDER — SODIUM CHLORIDE 0.9 % IV BOLUS
1000.0000 mL | Freq: Once | INTRAVENOUS | Status: AC
  Administered 2019-05-13: 10:00:00 1000 mL via INTRAVENOUS

## 2019-05-13 MED ORDER — ACETAMINOPHEN 650 MG RE SUPP: 650.0000 mg | Freq: Four times a day (QID) | RECTAL | Status: DC | PRN

## 2019-05-13 MED ORDER — POLYETHYLENE GLYCOL 3350 17 G PO PACK: 17.0000 g | PACK | Freq: Every day | ORAL | Status: DC | PRN

## 2019-05-13 NOTE — Progress Notes (Signed)
  Echocardiogram 2D Echocardiogram has been performed.  Krista [name] 05/13/2019, 5:43 PM

## 2019-05-13 NOTE — ED Notes (Signed)
Patient attempted to urinate, unable to, refusing in&out cath until fluids complete

## 2019-05-13 NOTE — H&P (Addendum)
[facility] Admission History and Physical Service Pager: [phone]  Patient name: Krista [name] Medical record number: [id number] Date of birth: 1961/05/14 Age: 58 y.o. Gender: female  Primary Care Provider: Mardi Mainland, FNP Consultants: none Code Status: full Preferred Emergency Contact: Mom, [phone]  Chief Complaint: SOB with chest pain  Assessment and Plan: Krista [name] is a 58 y.o. female presenting with shortness of breath and chest pain. PMH is significant for CAD, MI s/p stent placement on 5/4//2020, HTN, DM, HLD, and tobacco use disorder   Meeting SIRS criteria with concern for Sepsis Patient admitted meeting sepsis criteria with presumed CAP as source with temp of 102, WBC elevated to 13.4, and tachypnea. She received 2.5L of fluid bolus in the ED and another 1L on admission. Lactic Acid initially elevated 5.2 down trending but still elevated at 2.4. In the ED she was given Doxycycline 100mg  once and started on CTX IV and given one dose. Patient vitals stable with last BP of 108/78 with MAP of 87. Not on home oxygen but requiring 2L of [place] O2 on admission. CXR showed cardiomegaly that was largely unchanged from previous admission. No signs of fluid overload no opacities. Unclear as to the source of infection. Her symptoms are also consistent with PE. Her D-dimer is elevated and given her tachypnea with chest pain this is concerning for PE. Wells score of 3. Also considered MI as an acute cause however neither one of these would explain her high temperature. Troponins slightly elevated at 95 more consistent with demand ischemia due to an acute process. - Admit to progressive; attending Dr. Gwendlyn Deutscher - Stop CTX and Doxycycline (9/29)  - Start broad spectrum ABX Vanc and Cefepime per pharm. Stop Vanc if MRSA PCR is negative - F/u V/Q scan results - F/u BCx and UCx and U/A results - Giving another 1L NS bolus - Start IVFs at maintenance  rate - Daily CBC w/Diff - Repeat Lactic Acid until normalized - Telemetry with continuous pulse ox  Acute Hypoxic Respiratory Failure. Acute. Stable. Not on home oxygen requiring 2L [place] with continued tachypnea at RR 26. No sustained tachycardia with EKG showing LAFB and LV hypertrophy. Troponins elevated at 95. CXR showed no acute changes from last admission. Her chest pain has resolved. With elevated D-dimer, tachypnea, and new oxygen requirement must rule out PE. - F/u V/Q scan results - F/u Doppler LE U/S results - Obtain Echocardiogram - Repeat EKG in am - Cardiology consulted; appreciate recs - Trend Troponins  CAD s/p stent placement - Cont Plavix and ASA  AKI. Acute. Baseline appears to be around 0.7-0.[name] 9Most likely secondary to dehydration with recent illness. - Cont BMET daily - s/p 3.5L total fluid bolus - Avoid nephrotoxic agents  DM. Chronic. Stable with last A1c on 12/2018 at 7.2% - holding home metformin due to AKI with CrCl >30 - sSSI with no basal or meal time coverage correction - FBG checks in am with meal time checks  HTN. Chronic. Stable. Normal since admission with latest 108-87. - cont metop; unlikely she has acute MI but would be protective - holding lisinopril due to AKI  HLD. Chronic. Last lipid panel  - cont atorvastatin   Tobacco Use Disorder. Chronic. Smokes about 2 packs per week for over 30 years. - Offer cessation counseling - Offer nicotine replacement therapy while in hospital   FEN/GI: NPO except sips with meds Prophylaxis: Heparin Sub Q  Disposition: progressive unit admission  History of  Present Illness:  Krista [name] is a 58 y.o. female presenting with shortness of breath with chest pain following a fall in her home that occurred today. She states this has never happened before. She suffered from an MI back on 12/16/2018 with stent placed in the LAD. She has been on ASA and Plavix. Today she "just fell " and had difficulty getting back  up. She was found at home by her mom and they called EMS. She denies dizziness, syncope, or vertigo leading to her fall. She denies any weakness or sudden loss of ability to move her limbs on one side. Her chest pain occurred after the fall on the left side and has now subsided. She remains short of breath that has improved with supplemental oxygen.  Review Of Systems: Per HPI with the following additions:   Review of Systems  Constitutional: Positive for diaphoresis and fever. Negative for chills and weight loss.  HENT: Negative for congestion and sore throat.   Respiratory: Positive for cough and shortness of breath. Negative for sputum production and wheezing.   Cardiovascular: Positive for chest pain and leg swelling. Negative for palpitations, orthopnea and PND.  Gastrointestinal: Negative for abdominal pain, blood in stool, constipation, diarrhea, heartburn, nausea and vomiting.  Genitourinary: Negative for dysuria, frequency and urgency.  Musculoskeletal: Negative for falls and myalgias.  Neurological: Negative for loss of consciousness and weakness.    Patient Active Problem List   Diagnosis Date Noted  . Sepsis ([place]) 05/13/2019  . Coronary artery disease 02/12/2019  . Hyperlipidemia 02/12/2019  . Unstable angina ([place])   . Near syncope 12/14/2018  . Chest tightness 12/14/2018  . Prolonged Q-T interval on ECG   . Acute gastroenteritis 08/02/2012  . Dehydration 08/02/2012  . AKI (acute kidney injury) ([place]) 08/02/2012  . Hypokalemia 08/02/2012  . Diabetes mellitus ([place]) 08/02/2012  . HTN (hypertension) 08/02/2012  . Tobacco abuse 08/02/2012  . ALLERGIC RHINITIS WITH CONJUNCTIVITIS 10/21/2010  . ROTATOR CUFF SYNDROME, RIGHT 10/21/2010  . SEBACEOUS CYST, INFECTED 01/31/2010  . GUAIAC POSITIVE STOOL 10/05/2009  . TOBACCO ABUSE 09/21/2009  . RECTAL BLEEDING, HX OF 09/21/2009  . Nonspecific (abnormal) findings on radiological and other examination of body structure 05/19/2009  .  RIGHT LOWER LUNG FIELD CRACKLES 05/19/2009  . Diabetes ([place]) 02/12/2009  . Pain in limb 02/12/2009  . DENTAL CARIES 04/21/2008  . HYPERTENSION 05/01/2007    Past Medical History: Past Medical History:  Diagnosis Date  . Arthritis   . Depression   . Diabetes mellitus   . Hypertension     Past Surgical History: Past Surgical History:  Procedure Laterality Date  . ANKLE SURGERY     left ankle - was broken  . CORONARY STENT INTERVENTION N/A 12/16/2018   Procedure: CORONARY STENT INTERVENTION;  Surgeon: Leonie Man, MD;  Location: [facility];  Service: Cardiovascular;  Laterality: N/A;  LAD  . LEFT HEART CATH AND CORONARY ANGIOGRAPHY N/A 12/16/2018   Procedure: LEFT HEART CATH AND CORONARY ANGIOGRAPHY;  Surgeon: Leonie Man, MD;  Location: [facility];  Service: Cardiovascular;  Laterality: N/A;    Social History: Social History   Tobacco Use  . Smoking status: Current Every Day Smoker    Packs/day: 0.50    Years: 33.00    Pack years: 16.50    Types: Cigarettes  . Smokeless tobacco: Never Used  Substance Use Topics  . Alcohol use: No  . Drug use: No   Additional social history: Please also  refer to relevant sections of EMR.  Family History: Family History  Problem Relation Age of Onset  . Diabetes Mother   . COPD Mother   . Hypertension Mother   . Diabetes Father   . Hypertension Father     Allergies and Medications: No Known Allergies No current facility-administered medications on file prior to encounter.    Current Outpatient Medications on File Prior to Encounter  Medication Sig Dispense Refill  . acetaminophen (TYLENOL) 500 MG tablet Take 1,000 mg by mouth every 6 (six) hours as needed for mild pain or headache.    [name] atorvastatin (LIPITOR) 80 MG tablet Take 1 tablet (80 mg total) by mouth daily at 6 PM. 90 tablet 3  . clopidogrel (PLAVIX) 75 MG tablet Take 1 tablet (75 mg total) by mouth daily. TAKE A LOADING DOSE OF 600 MG (8  TABLETS) ON Thursday 02/13/2019. STARTING Friday 02/14/2019 TAKE 1 TABLET (75MG ) ON A DAILY BASIS. 90 tablet 3  . lisinopril (ZESTRIL) 10 MG tablet Take 1 tablet (10 mg total) by mouth daily. 90 tablet 3  . loratadine (CLARITIN) 10 MG tablet Take 10 mg by mouth daily.    . metFORMIN (GLUCOPHAGE) 1000 MG tablet Take 1,000 mg by mouth 2 (two) times daily with a meal.    . metoprolol tartrate (LOPRESSOR) 25 MG tablet Take 1 tablet (25 mg total) by mouth 2 (two) times daily. 90 tablet 3  . nitroGLYCERIN (NITROSTAT) 0.4 MG SL tablet Place 1 tablet (0.4 mg total) under the tongue every 5 (five) minutes as needed for chest pain. 30 tablet 1    Objective: BP 108/78   Pulse 83   Temp (!) 102.4 F (39.1 C) (Oral)   Resp (!) 26   Ht 5\' 3"  (1.6 m)   Wt 81.6 kg   SpO2 99%   BMI 31.89 kg/m  Exam: Gen: Alert and Oriented x 3, NAD HEENT: Normocephalic, atraumatic, PERRLA, EOMI Neck: trachea midline, no thyroidmegaly, no LAD CV: RRR, no murmurs, normal S1, S2 split Resp: CTAB, no wheezing, rales, or rhonchi, comfortable work of breathing Abd: non-distended, non-tender, soft, +bs in all four quadrants MSK: Moves all four extremities Ext: no clubbing, cyanosis, right lower extremity swelling and edema Neuro: CN II-XII grossly intact, No gross deficits of sensation, 5/5 UE and LE strength bilaterally Skin: warm, dry, intact, no rashes  Labs and Imaging: CBC BMET  Recent Labs  Lab 05/13/19 0911  WBC 13.4*  HGB 15.2*  HCT 46.4*  PLT 343   Recent Labs  Lab 05/13/19 0911  NA 144  K 3.9  CL 109  CO2 19*  BUN 21*  CREATININE 2.21*  GLUCOSE 293*  CALCIUM 9.6     EKG: Left anterior fasicular block with LVH. NSR. CXR: Cardiomegaly without acute abnormality of the lungs in AP portable projection.  V/Q scan: pending  LE venous doppler U/S: pending  Nuala Alpha, DO 05/13/2019, 12:52 PM PGY-3, [place] Intern pager: [phone], text pages welcome

## 2019-05-13 NOTE — Progress Notes (Signed)
LE venous duplex       has been completed. Preliminary results can be found under CV proc through chart review. Tiya Schrupp, BS, RDMS, RVT   

## 2019-05-13 NOTE — Telephone Encounter (Signed)
FYI for Dr.Berry. Thank you!

## 2019-05-13 NOTE — Progress Notes (Signed)
Pharmacy Antibiotic Note  Krista [name] is a 58 y.o. female admitted on 05/13/2019 with sepsis. Febrile, WBC elevated at 13.4, Scr 2.21 (BL ~0.7-0.9). Lactate > 2. Pharmacy has been consulted for vancomycin and cefepime dosing.   Received ceftriaxone 2 g IV once and Doxycyline PO 100 mg once which has now been discontinued. Cefepime will be started now in addition to vancomycin for broader coverage.   Plan: Cefepime 2 g IV 1x then 2 g IV q24h Vancomycin 1750 mg IV 1x then 1000 mg IV q48h (estAUC 508, goalAUC 400-550, Scr used 2.21) Monitor clinical improvement and renal function Monitor C/s  Height: 5\' 3"  (160 cm) Weight: 180 lb (81.6 kg) IBW/kg (Calculated) : 52.4  Temp (24hrs), Avg:102.4 F (39.1 C), Min:102.4 F (39.1 C), Max:102.4 F (39.1 C)  Recent Labs  Lab 05/13/19 0911 05/13/19 1220  WBC 13.4*  --   CREATININE 2.21*  --   LATICACIDVEN 5.2* 2.4*    Estimated Creatinine Clearance: 29.1 mL/min (A) (by C-G formula based on SCr of 2.21 mg/dL (H)).    No Known Allergies  Antimicrobials this admission: CTX 9/29 >> 9/29 Doxy 9/29 >> 9/29 Cefepime 9/29 >> Vanc 9/29 >>  Microbiology results: 9/29 BCx: pending 9/29 UCx: pending 9/29 COVID: negative 9/29 MRSA PCR: pending     Thank you for allowing pharmacy to be a part of this patient's care.   Lorel Monaco, PharmD PGY1 Ambulatory Care Resident [employer] # [phone]

## 2019-05-13 NOTE — Telephone Encounter (Signed)
New Message  Patient's mother is calling in to cancel follow up visit for today. States that patient was rushed to the hospital and is in the hospital now. States also that patient passed out, fell into the floor, and was having heart attack symptoms (issues with speech, unable to walk, pain in back, pain in chest, bp/hr is high, sweating, and hot).

## 2019-05-13 NOTE — ED Notes (Signed)
Pt. was ambulated to the restroom, and was offered assistance to lay in the bed, but denied any type of assistance.

## 2019-05-13 NOTE — Consult Note (Addendum)
Cardiology Consultation:   Krista [name] ID: Krista Krista [name] MRN: [id number]; DOB: 05/14/1961  Admit date: 05/13/2019 Date of Consult: 05/13/2019  Primary Care Provider: Mardi Mainland, [place] Primary Cardiologist: Quay Burow, MD  Primary Electrophysiologist:  None    Krista [name] Profile:   Krista Krista [name] is a 58 y.o. female with a hx of HTN, tobacco abuse, DM, hyperlipidemia, recent NSTEMI, CAD s/p DES to Krista LAD who is being seen today for Krista evaluation of elevated troponin in Krista setting of sepsis at Krista request of Dr. Gwendlyn Deutscher.  History of Present Illness:   Krista Krista [name] first began to follow with cardiology in May of this year after an admission for syncope. Troponin peaked at 7.57 and a cath was performed. Krista Krista [name] was found to have severe CAD and recieved DES of LAD. Krista Krista [name] had residual disease prox RCA lesion 60% stenosed, mid RCA lesion 35% stenosed, mid RCA to Dist RCA lesion 55% stenosed, RPDA lesion 65% stenosed, ost ramus to ramus lesion 45% stenosed. Echo showed EF 50-55% with hypokinesis of Krista distal inferior inferolateral wall, and a trivial pericardial effusion. LVEDP was normal. Krista Krista [name] was started on ASA and Brilinta. Krista Krista [name] was started on high dose statin as well. On discharge Krista Krista [name] was sent home with a 30 day event monitor which showed NSR without arrhythmias. At Krista follow up it appeared they planned to switch to Plavix for financial reasons.   On 05/13/19 Krista Krista [name] presented to Krista ED for fall at home, weakness, and chest pressure. Krista Krista [name] was in Krista Krista [name] normal state of health until this morning. Krista Krista [name] had just gotten up from Krista bathroom when Krista Krista [name] felt acutely weak in Krista Krista [name] legs, felt chest pressure, short of breath, and fell on Krista Krista [name] backside. Krista chest pressure occurred before Krista fall and was 8/10 non-radiating left sided pressure. Krista Krista [name] also felt acutely short of breath. Denied nausea or vomiting but did have diaphoresis. Krista Krista [name] was unable to get up due to severe weakness. Krista Krista [name] did  not hit Krista Krista [name] head or lose consciousness. Krista Krista [name] lives with Krista Krista [name] mother and they called EMS. Krista Krista [name] denies dizziness, syncope, or vertigo before falling. Krista Krista [name] was on Krista floor for about an hour before EMS showed up. Krista [name] was given 324 ASA and 1 NTG per EMS and was chest pain free upon arrival to Krista ED.  In Krista ED Krista [name] was SOB and Krista Krista [name] was started on 2L O2 for hypoxia (Krista [name] is not on O2 at baseline). Krista Krista [name] was febrile at 102 degrees F. Krista Krista [name] was tachycardic to Krista 120s. WBC was 13.4. Hgb 15.2. EKG showed sinus tachycardia with LVH, LAFB, and no ischemic changes. CXR showed stable cardiomegaly without acute abnormality. Lactic acid was 5.2. IVF were given and Krista Krista [name] was started on IV abx. Lactic acid decreased to 2.4.  D-dimer was elevated to 2.78. V/Q scan was ordered. Lower leg doppler was ordered. Hs troponin was found to be elevated, 95 > 165. COVID negative. Blood cultures were collected. Creatinine was 2.21 (baseline 1). ALT 67 and AST 100. Glucose 293. Krista Krista [name] was admitted for sepsis and fever of unknown origin.   Krista Krista [name] says Krista Krista [name] has not fallen in Krista past. Krista Krista [name] does not use a walker or cane normally. Chest pain felt similar to MI earlier this year, but denies any chest pain on exertion. Denies swelling in Krista Krista [name] legs and orthopnea. Krista Krista [name] is a smoker, 1 pack lasts 3-4 days. Denies alcohol or drug use. Krista Krista [name] is unable to work. Krista Krista [name] tries to watch Krista Krista [name]  diet and not eat out often. Denies recent illness. Says Krista Krista [name] had been eating and drinking normally.   Krista [name] is chest pain free on exam.   Heart Pathway Score:     Past Medical History:  Diagnosis Date   Arthritis    Depression    Diabetes mellitus    Hypertension     Past Surgical History:  Procedure Laterality Date   ANKLE SURGERY     left ankle - was broken   CORONARY STENT INTERVENTION N/A 12/16/2018   Procedure: CORONARY STENT INTERVENTION;  Surgeon: Leonie Man, MD;  Location: [facility];  Service: Cardiovascular;   Laterality: N/A;  LAD   LEFT HEART CATH AND CORONARY ANGIOGRAPHY N/A 12/16/2018   Procedure: LEFT HEART CATH AND CORONARY ANGIOGRAPHY;  Surgeon: Leonie Man, MD;  Location: [facility];  Service: Cardiovascular;  Laterality: N/A;     Home Medications:  Prior to Admission medications   Medication Sig Start Date End Date Taking? Authorizing Provider  acetaminophen (TYLENOL) 500 MG tablet Take 1,000 mg by mouth every 6 (six) hours as needed for mild pain or headache.   Yes [provider]  atorvastatin (LIPITOR) 80 MG tablet Take 1 tablet (80 mg total) by mouth daily at 6 PM. 02/12/19  Yes Lorretta Harp, MD  clopidogrel (PLAVIX) 75 MG tablet Take 1 tablet (75 mg total) by mouth daily. TAKE A LOADING DOSE OF 600 MG (8 TABLETS) ON Thursday 02/13/2019. STARTING Friday 02/14/2019 TAKE 1 TABLET (75MG ) ON A DAILY BASIS. 02/12/19  Yes Lorretta Harp, MD  lisinopril (ZESTRIL) 10 MG tablet Take 1 tablet (10 mg total) by mouth daily. 02/12/19  Yes Lorretta Harp, MD  loratadine (CLARITIN) 10 MG tablet Take 10 mg by mouth daily.   Yes [provider]  metFORMIN (GLUCOPHAGE) 1000 MG tablet Take 1,000 mg by mouth 2 (two) times daily with a meal. 04/29/19  Yes [provider]  metoprolol tartrate (LOPRESSOR) 25 MG tablet Take 1 tablet (25 mg total) by mouth 2 (two) times daily. 02/12/19  Yes Lorretta Harp, MD  nitroGLYCERIN (NITROSTAT) 0.4 MG SL tablet Place 1 tablet (0.4 mg total) under Krista tongue every 5 (five) minutes as needed for chest pain. 02/12/19  Yes Lorretta Harp, MD    Inpatient Medications: Scheduled Meds:  aspirin EC  81 mg Oral Daily   atorvastatin  80 mg Oral q1800   clopidogrel  75 mg Oral Daily   heparin  5,000 Units Subcutaneous Q8H   insulin aspart  0-9 Units Subcutaneous TID WC   metoprolol tartrate  25 mg Oral BID   senna  1 tablet Oral BID   Continuous Infusions:  sodium chloride     ceFEPime (MAXIPIME) IV     [START ON  05/14/2019] ceFEPime (MAXIPIME) IV     vancomycin 1,750 mg (05/13/19 1407)   [START ON 05/15/2019] vancomycin     PRN Meds: acetaminophen **OR** acetaminophen, polyethylene glycol  Allergies:   No Known Allergies  Social History:   Social History   Socioeconomic History   Marital status: Single    Spouse name: Not on file   Number of children: Not on file   Years of education: Not on file   Highest education level: Not on file  Occupational History   Not on file  Social Needs   Financial resource strain: Not on file   Food insecurity    Worry: Not on file    Inability:  Not on file   Transportation needs    Medical: Not on file    Non-medical: Not on file  Tobacco Use   Smoking status: Current Every Day Smoker    Packs/day: 0.50    Years: 33.00    Pack years: 16.50    Types: Cigarettes   Smokeless tobacco: Never Used  Substance and Sexual Activity   Alcohol use: No   Drug use: No   Sexual activity: Yes    Birth control/protection: Post-menopausal  Lifestyle   Physical activity    Days per week: Not on file    Minutes per session: Not on file   Stress: Not on file  Relationships   Social connections    Talks on phone: Not on file    Gets together: Not on file    Attends religious service: Not on file    Active member of club or organization: Not on file    Attends meetings of clubs or organizations: Not on file    Relationship status: Not on file   Intimate partner violence    Fear of current or ex partner: Not on file    Emotionally abused: Not on file    Physically abused: Not on file    Forced sexual activity: Not on file  Other Topics Concern   Not on file  Social History Narrative   Not on file    Family History:   Family History  Problem Relation Age of Onset   Diabetes Mother    COPD Mother    Hypertension Mother    Diabetes Father    Hypertension Father      ROS:  Please see Krista history of present illness.  All  other ROS reviewed and negative.     Physical Exam/Data:   Vitals:   05/13/19 1215 05/13/19 1230 05/13/19 1245 05/13/19 1400  BP: 100/77 108/78 (!) 123/95   Pulse: 95 83 81 79  Resp:  (!) 26    Temp:      TempSrc:      SpO2: 97% 99% 93% 94%  Weight:      Height:        Intake/Output Summary (Last 24 hours) at 05/13/2019 1505 Last data filed at 05/13/2019 1206 Gross per 24 hour  Intake 1100 ml  Output --  Net 1100 ml   Last 3 Weights 05/13/2019 02/12/2019 12/17/2018  Weight (lbs) 180 lb 180 lb 175 lb 1.6 oz  Weight (kg) 81.647 kg 81.647 kg 79.425 kg     Body mass index is 31.89 kg/m.  General:  Well nourished, well developed AAF, in no acute distress HEENT: normal Lymph: no adenopathy Neck: no JVD Endocrine:  No thryomegaly Vascular: No carotid bruits; FA pulses 2+ bilaterally without bruits  Cardiac:  normal S1, S2; RRR; no murmur  Lungs:  Diminished breath sounds bilaterally, no wheezing, rhonchi or rales; 2L O2 Abd: soft, nontender, no hepatomegaly  Ext: no edema Musculoskeletal:  No deformities, BUE and BLE strength normal and equal Skin: warm and dry  Neuro:  CNs 2-12 intact, no focal abnormalities noted Psych:  Normal affect   EKG:  Krista EKG was personally reviewed and demonstrates:  Sinus tachycardia, 153 bpm, LAD, LVH, subtle ST depression in lead 1 and subtle ST elevation in AvR that appears similar to EKG in 2018 Telemetry:  Telemetry was personally reviewed and demonstrates:  NSR and sinus tachycardia. Rates were previously elevated to 130s, now 80-90s  Relevant CV Studies:  Echo ordered  Cardiac Cath 12/16/18  Prox LAD lesion is 99% stenosed with 99% stenosed side branch in Ost 1st Diag.  A drug-eluting stent was successfully placed using a STENT SYNERGY DES 2.75X20. Postdilated to 3.1 mm  Post intervention, there is a 0% residual stenosis.  Post intervention, Krista side branch was reduced to 90% residual stenosis with downstream 80% stenosis. Plan is to  treat this medically.  ------------------------------------------------------  Prox RCA lesion is 60% stenosed. Mid RCA lesion is 35% stenosed. Mid RCA to Dist RCA lesion is 55% stenosed. RPDA lesion is 65% stenosed.  Ost Ramus to Ramus lesion is 45% stenosed.  Krista left ventricular systolic function is normal. Krista left ventricular ejection fraction is 50-55% by visual estimate. -Apical hypokinesis  ==============================   SUMMARY  Severe CAD with severe 99% proximal-mid LAD at takeoff of 1st Diag & Septal branches associated with severe 99% ostial stenosis in both branches (1st Diag is a relatively small caliber vessel and diffusely diseased), diffuse moderate to severe 55 to 65% stenoses in proximal and mid RCA as well as RPDA.  Successful DES PCI of LAD lesion with synergy DES 2.7 mm x 20 mm (3.1 mm) preserving diagonal and septal flow.  Preserved LVEF with apparent apical hypokinesis consistent with echocardiogram findings  Normal LVEDP   Echo 12/14/18  1. Krista left ventricle has low normal systolic function, with an ejection fraction of 50-55%. Krista cavity size was normal. Left ventricular diastolic Doppler parameters are indeterminate.  2. LVEF is approximately 50 to 55% with hypokinesis of Krista dista/akiensis of Krista distal infeiror, inferolateral wallsl.  3. Krista right ventricle has normal systolic function. Krista cavity was normal. There is no increase in right ventricular wall thickness.  4. Trivial pericardial effusion is present.  5. Krista aortic valve is tricuspid. Mild thickening of Krista aortic valve. Aortic valve regurgitation is trivial by color flow Doppler.   Laboratory Data:  High Sensitivity Troponin:   Recent Labs  Lab 05/13/19 0911 05/13/19 1204  TROPONINIHS 95* 165*     Chemistry Recent Labs  Lab 05/13/19 0911  NA 144  K 3.9  CL 109  CO2 19*  GLUCOSE 293*  BUN 21*  CREATININE 2.21*  CALCIUM 9.6  GFRNONAA 24*  GFRAA 28*  ANIONGAP 16*    Recent  Labs  Lab 05/13/19 0911  PROT 8.0  ALBUMIN 4.2  AST 100*  ALT 67*  ALKPHOS 67  BILITOT 1.0   Hematology Recent Labs  Lab 05/13/19 0911  WBC 13.4*  RBC 5.38*  HGB 15.2*  HCT 46.4*  MCV 86.2  MCH 28.3  MCHC 32.8  RDW 15.3  PLT 343   BNPNo results for input(s): BNP, PROBNP in Krista last 168 hours.  DDimer  Recent Labs  Lab 05/13/19 0911  DDIMER 2.78*     Radiology/Studies:  Dg Chest Port 1 View  Result Date: 05/13/2019 CLINICAL DATA:  Fever, shortness of breath EXAM: PORTABLE CHEST 1 VIEW COMPARISON:  12/13/2018 FINDINGS: Cardiomegaly. Unchanged elevation or eventration of Krista right hemidiaphragm with associated scarring or atelectasis. Krista visualized skeletal structures are unremarkable. IMPRESSION: Cardiomegaly without acute abnormality of Krista lungs in AP portable projection. Electronically Signed   By: Eddie Candle M.D.   On: 05/13/2019 09:36   Vas [place] Lower Extremity Venous (dvt) (only [facility])  Result Date: 05/13/2019  Lower Venous Study Indications: Edema.  Comparison Study: no prior Performing Technologist: June Leap RDMS, RVT  Examination Guidelines: A complete evaluation includes B-mode imaging, spectral Doppler, color Doppler,  and power Doppler as needed of all accessible portions of each vessel. Bilateral testing is considered an integral part of a complete examination. Limited examinations for reoccurring indications may be performed as noted.  +---------+---------------+---------+-----------+----------+--------------+  RIGHT     Compressibility Phasicity Spontaneity Properties Thrombus Aging  +---------+---------------+---------+-----------+----------+--------------+  CFV       Full            Yes       Yes                                    +---------+---------------+---------+-----------+----------+--------------+  SFJ       Full                                                             +---------+---------------+---------+-----------+----------+--------------+   FV Prox   Full                                                             +---------+---------------+---------+-----------+----------+--------------+  FV Mid    Full                                                             +---------+---------------+---------+-----------+----------+--------------+  FV Distal Full                                                             +---------+---------------+---------+-----------+----------+--------------+  PFV       Full                                                             +---------+---------------+---------+-----------+----------+--------------+  POP       Full            Yes       Yes                                    +---------+---------------+---------+-----------+----------+--------------+  PTV       Full                                                             +---------+---------------+---------+-----------+----------+--------------+  PERO      Full                                                             +---------+---------------+---------+-----------+----------+--------------+   +---------+---------------+---------+-----------+----------+--------------+  LEFT      Compressibility Phasicity Spontaneity Properties Thrombus Aging  +---------+---------------+---------+-----------+----------+--------------+  CFV       Full            Yes       Yes                                    +---------+---------------+---------+-----------+----------+--------------+  SFJ       Full                                                             +---------+---------------+---------+-----------+----------+--------------+  FV Prox   Full                                                             +---------+---------------+---------+-----------+----------+--------------+  FV Mid    Full                                                             +---------+---------------+---------+-----------+----------+--------------+  FV Distal Full                                                              +---------+---------------+---------+-----------+----------+--------------+  PFV       Full                                                             +---------+---------------+---------+-----------+----------+--------------+  POP       Full            Yes       Yes                                    +---------+---------------+---------+-----------+----------+--------------+  PTV       Full                                                             +---------+---------------+---------+-----------+----------+--------------+  PERO      Full                                                             +---------+---------------+---------+-----------+----------+--------------+  Summary: Right: There is no evidence of deep vein thrombosis in Krista lower extremity. A cystic structure is found in Krista popliteal fossa. Left: There is no evidence of deep vein thrombosis in Krista lower extremity. A cystic structure is found in Krista popliteal fossa.  *See table(s) above for measurements and observations.    Preliminary     Assessment and Plan:   SEPSIS/fever with unknown origin Admitted with temp 102, WBC 13.4, L.A. 5.2. Krista [name] received fluids and L.A. decreased to 2.4. CXR showed stable cardiomegaly and no other acute abnormalities. Krista [name] was started on abx. On 2L O2 (not on O2 at baseline). D-dimer is elevated, 2.78. Troponins 95 > 165. COVID negative - V/Q negative for PE - [place] lower extremity negative for DVT - Blood cultures pending - UA positive for luekocytes - on IVF - trend Lactic acid - WBC on admission 15.2 >>Trend CBC - H/o of cath with DES to LAD in April 2020 with echo showing EF 50-55% with hypokinesis of Krista distal inferior inferolateral wall, trivial pericardial effusion. Repeat echo ordered  Elevated Troponins/CAD s/p stent - Krista [name] had 8/10 left sided chest pressure, non-radiating that was relieved with NTG and ASA - Krista [name] was chest pain free on arrival to  ED - Troponins 95 > 165 - EKG shows sinus tachycardia with subtle ST depression in lead 1 and subtle ST elevation in AvR that appears similar to EKG in 2018 - Krista [name] had a cath in 12/2018 with DES to LAD and diffuse residual disease - Echo in 12/2018 showed EF 50-55% - Continue to trend Troponins - Not on heparin. Will discuss with MD - Continue plavix and ASA - Suspect elevated troponins likely from demand ischemia. If they continue to trend up can consider further ischemic evaluation. Would hold off on cath at this point given renal function and sepsis.  - Repeat echo ordered  AKI - Creatinine 2.21 - Baseline 0.7 - 0.9  - Krista [name] has received 3.5L of fluid since arrival - Lisinopril and metformon on hold - Will check a CK given patients history - Daily BMET  Elevated LFTs - ALT and AST elevated - Check LFTs in Krista AM  HTN - Metoprolol and Lisinopril home meds. Lisinopril on hold for AKI - Pressures stable>> increased after IVF  DM - A1C 8.4 - metformin on hold - SSI per IM  HLD - Home med atorvastatin - LDL 37 02/2019 - Check LFTs in Krista AM   Tobacco abuse - Recommend cessation  For questions or updates, please contact [facility] Please consult [email or web address] for contact info under   Signed, Cadence Ninfa Meeker, PA-C  05/13/2019 3:05 PM   Krista [name] seen, examined. Available data reviewed. Agree with findings, assessment, and plan as outlined by Cadence Kathlen Mody, PA-C.  Krista Krista [name] is independently interviewed and examined.  On my examination, Krista Krista [name] is an alert oriented, obese woman in no distress.  JVP is normal, lungs are clear, heart is regular rate and rhythm with no murmur or gallop, abdomen is soft, obese, and nontender.  Extremities have no edema.  Skin is warm and dry with no rash.  Krista Krista [name] is feeling much better than Krista Krista [name] was at Krista time of Krista Krista [name] presentation to Krista emergency room.  Krista Krista [name] states that Krista Krista [name] chest pain has completely resolved.  Krista Krista [name] denies shortness of  breath.  Krista Krista [name] describes an episode of profound leg weakness and fall to Krista ground, followed by chest discomfort which eventually resolved on its own.  Krista Krista [name] denies frank  syncope.  We are asked to see Krista Krista [name] for evaluation of chest pain and elevated troponin.  Krista Krista [name]'s EKG from this morning demonstrates sinus tachycardia 130 bpm with nonspecific ST change.  Krista Krista [name] echocardiogram is reviewed and is notable for Krista presence of preserved LV systolic function with moderate to severe LVH and no significant valvular disease.  Krista formal interpretation is copied below:  IMPRESSIONS    1. Left ventricular ejection fraction, by visual estimation, is 60 to 65%. Krista left ventricle has normal function. Normal left ventricular size. There is severely increased left ventricular hypertrophy.  2. Left ventricular diastolic Doppler parameters are consistent with pseudonormalization pattern of LV diastolic filling.  3. Global right ventricle has normal systolic function.Krista right ventricular size is normal.  4. Left atrial size was normal.  5. Right atrial size was normal.  6. Krista mitral valve is normal in structure. No evidence of mitral valve regurgitation. No evidence of mitral stenosis.  7. Krista tricuspid valve is normal in structure. Tricuspid valve regurgitation is mild.  8. Krista aortic valve is tricuspid Aortic valve regurgitation is trivial by color flow Doppler. Mild aortic valve sclerosis without stenosis.  9. Krista pulmonic valve was normal in structure. Pulmonic valve regurgitation is not visualized by color flow Doppler. 10. Aortic dilatation noted. 11. There is mild dilatation of Krista ascending aorta measuring 38 mm. 12. Mildly elevated pulmonary artery systolic pressure. 13. Krista inferior vena cava is dilated in size with >50% respiratory variability, suggesting right atrial pressure of 8 mmHg. 14. Normal LV systolic function; grade 2 diastolic dysfunction; moderate to severe LVH; mildly dilated ascending  aorta; trace AI; mild TR.  Notable labs include serial troponins of 165 and 313 on Krista Krista [name] most recent lab draw.  Krista Krista [name] initial lactic acid is 5.2 and follow-up lactic acid is 2.4.  Krista Krista [name]'s creatinine is 2.21 and is significant for acute kidney injury as Krista Krista [name] baseline creatinine is normal at 0.77 on February 25, 2019.  Krista Krista [name]'s clinical presentation is consistent with demand ischemia in Krista setting of febrile illness with elevated lactate and acute kidney injury.  Krista Krista [name] has not had exertional angina leading up to Krista events of today.  Krista Krista [name] is not having chest pain currently at Krista time of my evaluation.  I would recommend managing Krista Krista [name] conservatively in Krista setting of Krista Krista [name] other medical problems.  Fortunately Krista Krista [name] LV systolic function is preserved with no regional wall motion abnormalities.  Krista Krista [name] continues on dual antiplatelet therapy with aspirin and clopidogrel after PCI in May 2020.  We will follow with you but I would not plan any further cardiac evaluation unless symptoms arise or there is a marked increase in Krista Krista [name] troponin.  We will add a troponin onto Krista Krista [name] lab draw in Krista morning.  Sherren Mocha, M.D. 05/13/2019 6:10 PM

## 2019-05-13 NOTE — ED Triage Notes (Signed)
Pt BIB [facility] with c/o chest pain/pressure following weakness and a fall at home. Denies hitting head. Pt is SOB and O2 sat is 89% on room air. Pt is sinus tachy in the 120's. 324 mg ASA and 1 nitro per EMS.

## 2019-05-13 NOTE — ED Provider Notes (Signed)
[place] EMERGENCY DEPARTMENT Provider Note   CSN: [id number] Arrival date & time: 05/13/19  [id number]     History   Chief Complaint Chief Complaint  Patient presents with   Chest Pain    HPI Krista [name] is a 58 y.o. female.     The history is provided by the patient and medical records. No language interpreter was used.   Krista [name] is a 58 y.o. female who presents to the Emergency Department complaining of chest pain. She presents to the emergency department complaining of severe central chest pain that began upon waking this morning. She got up to get to the bathroom and fell to the floor due to generalized weakness. She does have some mild associated shortness of breath. She denies any fevers at home. She does have a mild cough. She denies any abdominal pain, nausea, vomiting. No known sick contacts. No prior similar symptoms. She does have a history of coronary artery disease, status post stenting in May of this year. She lives at home with her parents.  Past Medical History:  Diagnosis Date   Arthritis    CAD (coronary artery disease)    Depression    Diabetes mellitus    Hypertension    NSTEMI (non-ST elevated myocardial infarction) [facility])     Patient Active Problem List   Diagnosis Date Noted   Sepsis ([place]) 05/13/2019   NSTEMI (non-ST elevated myocardial infarction) ([place]) 05/13/2019   Coronary artery disease 02/12/2019   Hyperlipidemia 02/12/2019   Unstable angina ([place])    Near syncope 12/14/2018   Chest tightness 12/14/2018   Prolonged Q-T interval on ECG    Acute gastroenteritis 08/02/2012   Dehydration 08/02/2012   AKI (acute kidney injury) ([place]) 08/02/2012   Hypokalemia 08/02/2012   Diabetes mellitus ([place]) 08/02/2012   HTN (hypertension) 08/02/2012   Tobacco abuse 08/02/2012   ALLERGIC RHINITIS WITH CONJUNCTIVITIS 10/21/2010   ROTATOR CUFF SYNDROME, RIGHT 10/21/2010   SEBACEOUS CYST, INFECTED  01/31/2010   GUAIAC POSITIVE STOOL 10/05/2009   TOBACCO ABUSE 09/21/2009   RECTAL BLEEDING, HX OF 09/21/2009   Nonspecific (abnormal) findings on radiological and other examination of body structure 05/19/2009   RIGHT LOWER LUNG FIELD CRACKLES 05/19/2009   Diabetes ([place]) 02/12/2009   Pain in limb 02/12/2009   DENTAL CARIES 04/21/2008   HYPERTENSION 05/01/2007    Past Surgical History:  Procedure Laterality Date   ANKLE SURGERY     left ankle - was broken   CORONARY STENT INTERVENTION N/A 12/16/2018   Procedure: CORONARY STENT INTERVENTION;  Surgeon: Leonie Man, MD;  Location: [place] CV LAB;  Service: Cardiovascular;  Laterality: N/A;  LAD   LEFT HEART CATH AND CORONARY ANGIOGRAPHY N/A 12/16/2018   Procedure: LEFT HEART CATH AND CORONARY ANGIOGRAPHY;  Surgeon: Leonie Man, MD;  Location: [place] CV LAB;  Service: Cardiovascular;  Laterality: N/A;     OB History   No obstetric history on file.      Home Medications    Prior to Admission medications   Medication Sig Start Date End Date Taking? Authorizing Provider  acetaminophen (TYLENOL) 500 MG tablet Take 1,000 mg by mouth every 6 (six) hours as needed for mild pain or headache.   Yes [provider]  atorvastatin (LIPITOR) 80 MG tablet Take 1 tablet (80 mg total) by mouth daily at 6 PM. 02/12/19  Yes Lorretta Harp, MD  clopidogrel (PLAVIX) 75 MG tablet Take 1 tablet (75 mg total) by mouth  daily. TAKE A LOADING DOSE OF 600 MG (8 TABLETS) ON Thursday 02/13/2019. STARTING Friday 02/14/2019 TAKE 1 TABLET (75MG ) ON A DAILY BASIS. 02/12/19  Yes Lorretta Harp, MD  lisinopril (ZESTRIL) 10 MG tablet Take 1 tablet (10 mg total) by mouth daily. 02/12/19  Yes Lorretta Harp, MD  loratadine (CLARITIN) 10 MG tablet Take 10 mg by mouth daily.   Yes [provider]  metFORMIN (GLUCOPHAGE) 1000 MG tablet Take 1,000 mg by mouth 2 (two) times daily with a meal. 04/29/19  Yes [provider]    metoprolol tartrate (LOPRESSOR) 25 MG tablet Take 1 tablet (25 mg total) by mouth 2 (two) times daily. 02/12/19  Yes Lorretta Harp, MD  nitroGLYCERIN (NITROSTAT) 0.4 MG SL tablet Place 1 tablet (0.4 mg total) under the tongue every 5 (five) minutes as needed for chest pain. 02/12/19  Yes Lorretta Harp, MD    Family History Family History  Problem Relation Age of Onset   Diabetes Mother    COPD Mother    Hypertension Mother    Diabetes Father    Hypertension Father     Social History Social History   Tobacco Use   Smoking status: Current Every Day Smoker    Packs/day: 0.50    Years: 33.00    Pack years: 16.50    Types: Cigarettes   Smokeless tobacco: Never Used  Substance Use Topics   Alcohol use: No   Drug use: No     Allergies   Patient has no known allergies.   Review of Systems Review of Systems  All other systems reviewed and are negative.    Physical Exam Updated Vital Signs BP (!) 146/114    Pulse 79    Temp (!) 102.4 F (39.1 C) (Oral)    Resp (!) 26    Ht 5\' 3"  (1.6 m)    Wt 81.6 kg    SpO2 94%    BMI 31.89 kg/m   Physical Exam Vitals signs and nursing note reviewed.  Constitutional:      Appearance: She is well-developed. She is ill-appearing and diaphoretic.  HENT:     Head: Normocephalic and atraumatic.  Cardiovascular:     Rate and Rhythm: Regular rhythm. Tachycardia present.     Heart sounds: No murmur.  Pulmonary:     Effort: Pulmonary effort is normal. No respiratory distress.     Comments: tachypnea Abdominal:     Palpations: Abdomen is soft.     Tenderness: There is no abdominal tenderness. There is no guarding or rebound.  Musculoskeletal:        General: No swelling or tenderness.  Skin:    General: Skin is warm.  Neurological:     Mental Status: She is alert and oriented to person, place, and time.  Psychiatric:        Behavior: Behavior normal.      ED Treatments / Results  Labs (all labs ordered are  listed, but only abnormal results are displayed) Labs Reviewed  LACTIC ACID, PLASMA - Abnormal; Notable for the following components:      Result Value   Lactic Acid, Venous 5.2 (*)    All other components within normal limits  LACTIC ACID, PLASMA - Abnormal; Notable for the following components:   Lactic Acid, Venous 2.4 (*)    All other components within normal limits  COMPREHENSIVE METABOLIC PANEL - Abnormal; Notable for the following components:   CO2 19 (*)    Glucose, Bld  293 (*)    BUN 21 (*)    Creatinine, Ser 2.21 (*)    AST 100 (*)    ALT 67 (*)    GFR calc non Af Amer 24 (*)    GFR calc Af Amer 28 (*)    Anion gap 16 (*)    All other components within normal limits  CBC WITH DIFFERENTIAL/PLATELET - Abnormal; Notable for the following components:   WBC 13.4 (*)    RBC 5.38 (*)    Hemoglobin 15.2 (*)    HCT 46.4 (*)    Neutro Abs 10.9 (*)    Abs Immature Granulocytes 0.12 (*)    All other components within normal limits  URINALYSIS, ROUTINE W REFLEX MICROSCOPIC - Abnormal; Notable for the following components:   APPearance CLOUDY (*)    Hgb urine dipstick LARGE (*)    Protein, ur 100 (*)    Leukocytes,Ua LARGE (*)    Bacteria, UA FEW (*)    All other components within normal limits  D-DIMER, QUANTITATIVE (NOT AT [facility]) - Abnormal; Notable for the following components:   D-Dimer, Quant 2.78 (*)    All other components within normal limits  HEMOGLOBIN A1C - Abnormal; Notable for the following components:   Hgb A1c MFr Bld 8.4 (*)    All other components within normal limits  TROPONIN I (HIGH SENSITIVITY) - Abnormal; Notable for the following components:   Troponin I (High Sensitivity) 95 (*)    All other components within normal limits  TROPONIN I (HIGH SENSITIVITY) - Abnormal; Notable for the following components:   Troponin I (High Sensitivity) 165 (*)    All other components within normal limits  SARS CORONAVIRUS 2 (HOSPITAL ORDER, [place] [facility])  MRSA PCR SCREENING  CULTURE, BLOOD (ROUTINE X 2)  CULTURE, BLOOD (ROUTINE X 2)  URINE CULTURE  APTT  PROTIME-INR  MAGNESIUM  I-STAT BETA HCG BLOOD, ED ([facility], [facility], [facility] ONLY)  TROPONIN I (HIGH SENSITIVITY)  TROPONIN I (HIGH SENSITIVITY)    EKG EKG Interpretation  Date/Time:  Tuesday May 13 2019 08:56:48 EDT Ventricular Rate:  131 PR Interval:    QRS Duration: 78 QT Interval:  313 QTC Calculation: 462 R Axis:   -50 Text Interpretation:  Sinus tachycardia Left anterior fascicular block Left ventricular hypertrophy Confirmed by Quintella Reichert [phone]) on 05/13/2019 9:29:54 AM   Radiology Nm Pulmonary Perfusion  Result Date: 05/13/2019 CLINICAL DATA:  Positive D-dimer, intermittent probability for PE EXAM: NUCLEAR MEDICINE PERFUSION LUNG SCAN TECHNIQUE: Perfusion images were obtained in multiple projections after intravenous injection of radiopharmaceutical. Ventilation scans intentionally deferred if perfusion scan and chest x-ray adequate for interpretation during COVID 19 epidemic. RADIOPHARMACEUTICALS:  1.6 mCi Tc-47m MAA IV COMPARISON:  Correlation with chest radiograph dated 05/13/2019 FINDINGS: Normal perfusion. Ventilation was deferred. Corresponding chest radiograph demonstrates clear lungs. IMPRESSION: Negative for pulmonary embolism. Electronically Signed   By: Julian Hy M.D.   On: 05/13/2019 15:16   Dg Chest Port 1 View  Result Date: 05/13/2019 CLINICAL DATA:  Fever, shortness of breath EXAM: PORTABLE CHEST 1 VIEW COMPARISON:  12/13/2018 FINDINGS: Cardiomegaly. Unchanged elevation or eventration of the right hemidiaphragm with associated scarring or atelectasis. The visualized skeletal structures are unremarkable. IMPRESSION: Cardiomegaly without acute abnormality of the lungs in AP portable projection. Electronically Signed   By: Eddie Candle M.D.   On: 05/13/2019 09:36   Vas [place] Lower Extremity Venous (dvt) (only [facility])  Result Date: 05/13/2019   Lower Venous  Study Indications: Edema.  Comparison Study: no prior Performing Technologist: June Leap RDMS, RVT  Examination Guidelines: A complete evaluation includes B-mode imaging, spectral Doppler, color Doppler, and power Doppler as needed of all accessible portions of each vessel. Bilateral testing is considered an integral part of a complete examination. Limited examinations for reoccurring indications may be performed as noted.  +---------+---------------+---------+-----------+----------+--------------+  RIGHT     Compressibility Phasicity Spontaneity Properties Thrombus Aging  +---------+---------------+---------+-----------+----------+--------------+  CFV       Full            Yes       Yes                                    +---------+---------------+---------+-----------+----------+--------------+  SFJ       Full                                                             +---------+---------------+---------+-----------+----------+--------------+  FV Prox   Full                                                             +---------+---------------+---------+-----------+----------+--------------+  FV Mid    Full                                                             +---------+---------------+---------+-----------+----------+--------------+  FV Distal Full                                                             +---------+---------------+---------+-----------+----------+--------------+  PFV       Full                                                             +---------+---------------+---------+-----------+----------+--------------+  POP       Full            Yes       Yes                                    +---------+---------------+---------+-----------+----------+--------------+  PTV       Full                                                             +---------+---------------+---------+-----------+----------+--------------+  PERO      Full                                                              +---------+---------------+---------+-----------+----------+--------------+   +---------+---------------+---------+-----------+----------+--------------+  LEFT      Compressibility Phasicity Spontaneity Properties Thrombus Aging  +---------+---------------+---------+-----------+----------+--------------+  CFV       Full            Yes       Yes                                    +---------+---------------+---------+-----------+----------+--------------+  SFJ       Full                                                             +---------+---------------+---------+-----------+----------+--------------+  FV Prox   Full                                                             +---------+---------------+---------+-----------+----------+--------------+  FV Mid    Full                                                             +---------+---------------+---------+-----------+----------+--------------+  FV Distal Full                                                             +---------+---------------+---------+-----------+----------+--------------+  PFV       Full                                                             +---------+---------------+---------+-----------+----------+--------------+  POP       Full            Yes       Yes                                    +---------+---------------+---------+-----------+----------+--------------+  PTV       Full                                                             +---------+---------------+---------+-----------+----------+--------------+  PERO      Full                                                             +---------+---------------+---------+-----------+----------+--------------+     Summary: Right: There is no evidence of deep vein thrombosis in the lower extremity. A cystic structure is found in the popliteal fossa. Left: There is no evidence of deep vein thrombosis in the lower extremity. A cystic structure is found in the popliteal  fossa.  *See table(s) above for measurements and observations.    Preliminary     Procedures Procedures (including critical care time) CRITICAL CARE Performed by: Quintella Reichert   Total critical care time: 35 minutes  Critical care time was exclusive of separately billable procedures and treating other patients.  Critical care was necessary to treat or prevent imminent or life-threatening deterioration.  Critical care was time spent personally by me on the following activities: development of treatment plan with patient and/or surrogate as well as nursing, discussions with consultants, evaluation of patient's response to treatment, examination of patient, obtaining history from patient or surrogate, ordering and performing treatments and interventions, ordering and review of laboratory studies, ordering and review of radiographic studies, pulse oximetry and re-evaluation of patient's condition.  Medications Ordered in ED Medications  atorvastatin (LIPITOR) tablet 80 mg (has no administration in time range)  metoprolol tartrate (LOPRESSOR) tablet 25 mg (has no administration in time range)  clopidogrel (PLAVIX) tablet 75 mg (has no administration in time range)  heparin injection 5,000 Units (has no administration in time range)  acetaminophen (TYLENOL) tablet 650 mg (has no administration in time range)    Or  acetaminophen (TYLENOL) suppository 650 mg (has no administration in time range)  senna (SENOKOT) tablet 8.6 mg (has no administration in time range)  polyethylene glycol (MIRALAX / GLYCOLAX) packet 17 g (has no administration in time range)  aspirin EC tablet 81 mg (has no administration in time range)  insulin aspart (novoLOG) injection 0-9 Units (has no administration in time range)  0.9 %  sodium chloride infusion (has no administration in time range)  ceFEPIme (MAXIPIME) 2 g in sodium chloride 0.9 % 100 mL IVPB (has no administration in time range)  vancomycin (VANCOCIN)  IVPB 1000 mg/200 mL premix (has no administration in time range)  ceFEPIme (MAXIPIME) 2 g in sodium chloride 0.9 % 100 mL IVPB (has no administration in time range)  sodium chloride 0.9 % bolus 1,000 mL (0 mLs Intravenous Stopped 05/13/19 1041)  acetaminophen (TYLENOL) tablet 650 mg (650 mg Oral Given 05/13/19 0936)  doxycycline (VIBRA-TABS) tablet 100 mg (100 mg Oral Given 05/13/19 1219)  sodium chloride 0.9 % bolus 1,500 mL (1,500 mLs Intravenous New Bag/Given 05/13/19 1224)  sodium chloride 0.9 % bolus 1,000 mL (1,000 mLs Intravenous New Bag/Given 05/13/19 1347)  vancomycin (VANCOCIN) 1,750 mg in sodium chloride 0.9 % 500 mL IVPB (1,750 mg Intravenous New Bag/Given 05/13/19 1407)  technetium albumin aggregated (MAA) injection solution 1.6 millicurie (1.6 millicuries Intravenous Contrast Given 05/13/19 1436)     Initial Impression / Assessment and Plan / ED Course  I have reviewed the triage vital signs and the nursing notes.  Pertinent labs & imaging results that were available during my care of the patient were reviewed by me and considered in  my medical decision making (see chart for details).        Patient here for evaluation of chest pain that began this morning. She is ill appearing on ED arrival with tachycardia, diaphoresis, tachypnea. Initial concern for possible community acquired pneumonia and she was treated with IV fluids, antibiotics. Given history of prolonged QT in the record she was treated with doxycycline instead of azithromycin. Chest x-ray with no clear pneumonia but patient does have a new oxygen requirement. Her rapid COVID swab is negative. Her D dimer is elevated, concern for DVT/PE and lower extremity Doppler's as well as VQ scan were ordered. Labs are significant for AKI, elevated troponin as well as mild elevation in transaminases.  Patient updated of findings of studies and recommendation for admission and she is in agreement with treatment plan.  Medicine consulted for  admission.    Final Clinical Impressions(s) / ED Diagnoses   Final diagnoses:  None    ED Discharge Orders    None       Quintella Reichert, MD 05/13/19 [phone]

## 2019-05-13 NOTE — Progress Notes (Signed)
Notified bedside nurse of need to draw repeat lactic acid. 

## 2019-05-13 NOTE — ED Notes (Signed)
Called main lab to add on troponin.  

## 2019-05-14 ENCOUNTER — Inpatient Hospital Stay (HOSPITAL_COMMUNITY): Payer: Medicaid Other

## 2019-05-14 DIAGNOSIS — I25118 Atherosclerotic heart disease of native coronary artery with other forms of angina pectoris: Secondary | ICD-10-CM

## 2019-05-14 DIAGNOSIS — R740 Nonspecific elevation of levels of transaminase and lactic acid dehydrogenase [LDH]: Secondary | ICD-10-CM

## 2019-05-14 DIAGNOSIS — I248 Other forms of acute ischemic heart disease: Secondary | ICD-10-CM | POA: Insufficient documentation

## 2019-05-14 LAB — BASIC METABOLIC PANEL
Anion gap: 8 (ref 5–15)
BUN: 14 mg/dL (ref 6–20)
CO2: 20 mmol/L — ABNORMAL LOW (ref 22–32)
Calcium: 7.7 mg/dL — ABNORMAL LOW (ref 8.9–10.3)
Chloride: 117 mmol/L — ABNORMAL HIGH (ref 98–111)
Creatinine, Ser: 1.12 mg/dL — ABNORMAL HIGH (ref 0.44–1.00)
GFR calc Af Amer: >60 mL/min (ref >60)
GFR calc non Af Amer: 55 mL/min — ABNORMAL LOW (ref >60)
Glucose, Bld: 83 mg/dL (ref 70–99)
Potassium: 2.8 mmol/L — ABNORMAL LOW (ref 3.5–5.1)
Sodium: 145 mmol/L (ref 135–145)

## 2019-05-14 LAB — CBC WITH DIFFERENTIAL/PLATELET
Abs Immature Granulocytes: 0.04 10*3/uL (ref 0.00–0.07)
Basophils Absolute: 0 10*3/uL (ref 0.0–0.1)
Basophils Relative: 0 %
Eosinophils Absolute: 0.1 10*3/uL (ref 0.0–0.5)
Eosinophils Relative: 1 %
HCT: 38.9 % (ref 36.0–46.0)
Hemoglobin: 12.5 g/dL (ref 12.0–15.0)
Immature Granulocytes: 0 %
Lymphocytes Relative: 29 %
Lymphs Abs: 3.3 10*3/uL (ref 0.7–4.0)
MCH: 28.3 pg (ref 26.0–34.0)
MCHC: 32.1 g/dL (ref 30.0–36.0)
MCV: 88 fL (ref 80.0–100.0)
Monocytes Absolute: 1 10*3/uL (ref 0.1–1.0)
Monocytes Relative: 9 %
Neutro Abs: 6.6 10*3/uL (ref 1.7–7.7)
Neutrophils Relative %: 61 %
Platelets: 201 10*3/uL (ref 150–400)
RBC: 4.42 MIL/uL (ref 3.87–5.11)
RDW: 15.6 % — ABNORMAL HIGH (ref 11.5–15.5)
WBC: 11.1 10*3/uL — ABNORMAL HIGH (ref 4.0–10.5)
nRBC: 0 % (ref 0.0–0.2)

## 2019-05-14 LAB — URINE CULTURE

## 2019-05-14 LAB — TROPONIN I (HIGH SENSITIVITY)
Troponin I (High Sensitivity): 501 ng/L (ref <18)
Troponin I (High Sensitivity): 361 ng/L (ref <18)
Troponin I (High Sensitivity): 328 ng/L (ref <18)

## 2019-05-14 LAB — GLUCOSE, CAPILLARY
Glucose-Capillary: 162 mg/dL — ABNORMAL HIGH (ref 70–99)
Glucose-Capillary: 216 mg/dL — ABNORMAL HIGH (ref 70–99)

## 2019-05-14 LAB — HEPATIC FUNCTION PANEL
ALT: 89 U/L — ABNORMAL HIGH (ref 0–44)
AST: 278 U/L — ABNORMAL HIGH (ref 15–41)
Albumin: 3 g/dL — ABNORMAL LOW (ref 3.5–5.0)
Alkaline Phosphatase: 46 U/L (ref 38–126)
Bilirubin, Direct: 0.2 mg/dL (ref 0.0–0.2)
Indirect Bilirubin: 1 mg/dL — ABNORMAL HIGH (ref 0.3–0.9)
Total Bilirubin: 1.2 mg/dL (ref 0.3–1.2)
Total Protein: 5.5 g/dL — ABNORMAL LOW (ref 6.5–8.1)

## 2019-05-14 LAB — CK: Total CK: 39358 U/L — ABNORMAL HIGH (ref 38–234)

## 2019-05-14 LAB — CBG MONITORING, ED: Glucose-Capillary: 68 mg/dL — ABNORMAL LOW (ref 70–99)

## 2019-05-14 MED ORDER — SODIUM CHLORIDE 0.9 % IV SOLN
500.0000 mg | INTRAVENOUS | Status: AC
  Administered 2019-05-14 – 2019-05-15 (×2): 500 mg via INTRAVENOUS
  Filled 2019-05-14 (×2): qty 500

## 2019-05-14 MED ORDER — POTASSIUM CHLORIDE CRYS ER 20 MEQ PO TBCR
40.0000 meq | EXTENDED_RELEASE_TABLET | Freq: Four times a day (QID) | ORAL | Status: AC
  Administered 2019-05-14 (×2): 40 meq via ORAL
  Filled 2019-05-14 (×2): qty 2

## 2019-05-14 MED ORDER — SODIUM CHLORIDE 0.9 % IV SOLN
1.0000 g | INTRAVENOUS | Status: DC
  Administered 2019-05-14 – 2019-05-15 (×2): 1 g via INTRAVENOUS
  Filled 2019-05-14: qty 10
  Filled 2019-05-14: qty 1

## 2019-05-14 MED ORDER — DICLOFENAC SODIUM 1 % TD GEL
4.0000 g | Freq: Four times a day (QID) | TRANSDERMAL | Status: DC | PRN
  Administered 2019-05-14 – 2019-05-15 (×3): 4 g via TOPICAL
  Filled 2019-05-14: qty 100

## 2019-05-14 MED ORDER — ENOXAPARIN SODIUM 40 MG/0.4ML ~~LOC~~ SOLN
40.0000 mg | SUBCUTANEOUS | Status: DC
  Administered 2019-05-14 – 2019-05-15 (×2): 40 mg via SUBCUTANEOUS
  Filled 2019-05-14 (×3): qty 0.4

## 2019-05-14 MED ORDER — SENNA 8.6 MG PO TABS: 1.0000 | ORAL_TABLET | Freq: Two times a day (BID) | ORAL | Status: DC | PRN

## 2019-05-14 MED ORDER — TRAMADOL HCL 50 MG PO TABS
50.0000 mg | ORAL_TABLET | Freq: Once | ORAL | Status: AC
  Administered 2019-05-14: 50 mg via ORAL
  Filled 2019-05-14: qty 1

## 2019-05-14 NOTE — ED Notes (Signed)
ED TO INPATIENT HANDOFF REPORT  ED Nurse Name and Phone #: Lorrin Goodell [phone]  S Name/Age/Gender Krista [name] 58 y.o. female Room/Bed: 014C/014C  Code Status   Code Status: Full Code  Home/SNF/Other Home Patient oriented to: self, place, time and situation Is this baseline? Yes   Triage Complete: Triage complete  Chief Complaint cp  Triage Note Pt BIB [facility] with c/o chest pain/pressure following weakness and a fall at home. Denies hitting head. Pt is SOB and O2 sat is 89% on room air. Pt is sinus tachy in the 120's. 324 mg ASA and 1 nitro per EMS.    Allergies No Known Allergies  Level of Care/Admitting Diagnosis ED Disposition    ED Disposition Condition [facility]: [place] [[id number]]  Level of Care: Progressive [102]  Covid Evaluation: Confirmed COVID Negative  [facility]: [id number]  Admitting Physician: Nuala Alpha [id number]  Attending Physician: Leeanne Rio [phone]  Estimated length of stay: past midnight tomorrow  Certification:: I certify this patient will need inpatient services for at least 2 midnights  PT Class (Do Not Modify): Inpatient [101]  PT Acc Code (Do Not Modify): Private [1]       B Medical/Surgery History Past Medical History:  Diagnosis Date  . Arthritis   . CAD (coronary artery disease)   . Depression   . Diabetes mellitus   . Hypertension   . NSTEMI (non-ST elevated myocardial infarction) [facility])    Past Surgical History:  Procedure Laterality Date  . ANKLE SURGERY     left ankle - was broken  . CORONARY STENT INTERVENTION N/A 12/16/2018   Procedure: CORONARY STENT INTERVENTION;  Surgeon: Leonie Man, MD;  Location: [place] [facility];  Service: Cardiovascular;  Laterality: N/A;  LAD  . LEFT HEART CATH AND CORONARY ANGIOGRAPHY N/A 12/16/2018   Procedure: LEFT HEART CATH AND CORONARY ANGIOGRAPHY;  Surgeon: Leonie Man, MD;  Location: [facility];  Service:  Cardiovascular;  Laterality: N/A;     A IV Location/Drains/Wounds Patient Lines/Drains/Airways Status   Active Line/Drains/Airways    Name:   Placement date:   Placement time:   Site:   Days:   Peripheral IV 05/13/19 Left Antecubital   05/13/19    0935    Antecubital   1   Peripheral IV 05/13/19 Right Antecubital   05/13/19    0950    Antecubital   1          Intake/Output Last 24 hours  Intake/Output Summary (Last 24 hours) at 05/14/2019 1322 Last data filed at 05/14/2019 1202 Gross per 24 hour  Intake 3200 ml  Output -  Net 3200 ml    Labs/Imaging Results for orders placed or performed during the hospital encounter of 05/13/19 (from the past 48 hour(s))  Lactic acid, plasma     Status: Abnormal   Collection Time: 05/13/19  9:11 AM  Result Value Ref Range   Lactic Acid, Venous 5.2 (HH) 0.5 - 1.9 mmol/L    Comment: CRITICAL RESULT CALLED TO, READ BACK BY AND VERIFIED WITH: KOPP,G RN @1153  ON [id number] BY FLEMINGS Performed at [facility], [address] [place], [place] [address]   Comprehensive metabolic panel     Status: Abnormal   Collection Time: 05/13/19  9:11 AM  Result Value Ref Range   Sodium 144 135 - 145 mmol/L   Potassium 3.9 3.5 - 5.1 mmol/L   Chloride 109 98 -  111 mmol/L   CO2 19 (L) 22 - 32 mmol/L   Glucose, Bld 293 (H) 70 - 99 mg/dL   BUN 21 (H) 6 - 20 mg/dL   Creatinine, Ser 2.21 (H) 0.44 - 1.00 mg/dL   Calcium 9.6 8.9 - 10.3 mg/dL   Total Protein 8.0 6.5 - 8.1 g/dL   Albumin 4.2 3.5 - 5.0 g/dL   AST 100 (H) 15 - 41 U/L   ALT 67 (H) 0 - 44 U/L   Alkaline Phosphatase 67 38 - 126 U/L   Total Bilirubin 1.0 0.3 - 1.2 mg/dL   GFR calc non Af Amer 24 (L) >60 mL/min   GFR calc Af Amer 28 (L) >60 mL/min   Anion gap 16 (H) 5 - 15    Comment: Performed at [facility] [address], [place], [place] [address]  CBC WITH DIFFERENTIAL     Status: Abnormal   Collection Time: 05/13/19  9:11 AM  Result Value Ref Range   WBC 13.4 (H) 4.0 - 10.5  K/uL   RBC 5.38 (H) 3.87 - 5.11 MIL/uL   Hemoglobin 15.2 (H) 12.0 - 15.0 g/dL   HCT 46.4 (H) 36.0 - 46.0 %   MCV 86.2 80.0 - 100.0 fL   MCH 28.3 26.0 - 34.0 pg   MCHC 32.8 30.0 - 36.0 g/dL   RDW 15.3 11.5 - 15.5 %   Platelets 343 150 - 400 K/uL   nRBC 0.0 0.0 - 0.2 %   Neutrophils Relative % 81 %   Neutro Abs 10.9 (H) 1.7 - 7.7 K/uL   Lymphocytes Relative 11 %   Lymphs Abs 1.4 0.7 - 4.0 K/uL   Monocytes Relative 7 %   Monocytes Absolute 0.9 0.1 - 1.0 K/uL   Eosinophils Relative 0 %   Eosinophils Absolute 0.0 0.0 - 0.5 K/uL   Basophils Relative 0 %   Basophils Absolute 0.0 0.0 - 0.1 K/uL   Immature Granulocytes 1 %   Abs Immature Granulocytes 0.12 (H) 0.00 - 0.07 K/uL    Comment: Performed at [facility], [address], [place], [place] [address]  APTT     Status: None   Collection Time: 05/13/19  9:11 AM  Result Value Ref Range   aPTT 31 24 - 36 seconds    Comment: Performed at [facility] [address], [place], [place] [address]  Protime-INR     Status: None   Collection Time: 05/13/19  9:11 AM  Result Value Ref Range   Prothrombin Time 15.1 11.4 - 15.2 seconds   INR 1.2 0.8 - 1.2    Comment: (NOTE) INR goal varies based on device and disease states. Performed at [facility], [place] [address], [place], [place] [address]   D-dimer, quantitative (not at [facility])     Status: Abnormal   Collection Time: 05/13/19  9:11 AM  Result Value Ref Range   D-Dimer, Quant 2.78 (H) 0.00 - 0.50 ug/mL-FEU    Comment: (NOTE) At the manufacturer cut-off of 0.50 ug/mL FEU, this assay has been documented to exclude PE with a sensitivity and negative predictive value of 97 to 99%.  At this time, this assay has not been approved by the FDA to exclude DVT/VTE. Results should be correlated with clinical presentation. Performed at [facility], [place] [address] [place],  [address]   Troponin I (High Sensitivity)     Status: Abnormal   Collection Time:  05/13/19  9:11 AM  Result Value Ref Range   Troponin I (High Sensitivity) 95 (H) <18 ng/L    Comment: (NOTE) Elevated high sensitivity troponin I (hsTnI) values and significant  changes across serial measurements may suggest ACS but many other  chronic and acute conditions are known to elevate hsTnI results.  Refer to the "Links" section for chest pain algorithms and additional  guidance. Performed at [facility], [place] [address], [place], [place] [address]   SARS Coronavirus 2 [facility] order, Performed in [facility] hospital lab) Nasopharyngeal Nasopharyngeal Swab     Status: None   Collection Time: 05/13/19  9:13 AM   Specimen: Nasopharyngeal Swab  Result Value Ref Range   SARS Coronavirus 2 NEGATIVE NEGATIVE    Comment: (NOTE) If result is NEGATIVE SARS-CoV-2 target nucleic acids are NOT DETECTED. The SARS-CoV-2 RNA is generally detectable in upper and lower  respiratory specimens during the acute phase of infection. The lowest  concentration of SARS-CoV-2 viral copies this assay can detect is 250  copies / mL. A negative result does not preclude SARS-CoV-2 infection  and should not be used as the sole basis for treatment or other  patient management decisions.  A negative result may occur with  improper specimen collection / handling, submission of specimen other  than nasopharyngeal swab, presence of viral mutation(s) within the  areas targeted by this assay, and inadequate number of viral copies  (<250 copies / mL). A negative result must be combined with clinical  observations, patient history, and epidemiological information. If result is POSITIVE SARS-CoV-2 target nucleic acids are DETECTED. The SARS-CoV-2 RNA is generally detectable in upper and lower  respiratory specimens dur ing the acute phase of infection.  Positive  results are indicative of active infection with SARS-CoV-2.  Clinical  correlation with patient history and other diagnostic information is   necessary to determine patient infection status.  Positive results do  not rule out bacterial infection or co-infection with other viruses. If result is PRESUMPTIVE POSTIVE SARS-CoV-2 nucleic acids MAY BE PRESENT.   A presumptive positive result was obtained on the submitted specimen  and confirmed on repeat testing.  While 2019 novel coronavirus  (SARS-CoV-2) nucleic acids may be present in the submitted sample  additional confirmatory testing may be necessary for epidemiological  and / or clinical management purposes  to differentiate between  SARS-CoV-2 and other Sarbecovirus currently known to infect humans.  If clinically indicated additional testing with an alternate test  methodology [phone]) is advised. The SARS-CoV-2 RNA is generally  detectable in upper and lower respiratory sp ecimens during the acute  phase of infection. The expected result is Negative. Fact Sheet for Patients:  StrictlyIdeas.no Fact Sheet for Healthcare Providers: BankingDealers.co.za This test is not yet approved or cleared by the [place] FDA and has been authorized for detection and/or diagnosis of SARS-CoV-2 by FDA under an Emergency Use Authorization (EUA).  This EUA will remain in effect (meaning this test can be used) for the duration of the COVID-19 declaration under Section 564(b)(1) of the Act, 21 U.S.C. section 360bbb-3(b)(1), unless the authorization is terminated or revoked sooner. Performed at [facility], [place] [address] [place], [place] [address]   Blood Culture (routine x 2)     Status: None (Preliminary result)   Collection Time: 05/13/19  9:33 AM   Specimen: BLOOD  Result Value Ref Range   Specimen Description BLOOD LEFT ANTECUBITAL    Special Requests  BOTTLES DRAWN AEROBIC AND ANAEROBIC Blood Culture adequate volume   Culture      NO GROWTH < 24 HOURS Performed at [facility], [place] [address],  [place], [place] [address]    Report Status PENDING   Blood Culture (routine x 2)     Status: None (Preliminary result)   Collection Time: 05/13/19  9:49 AM   Specimen: BLOOD  Result Value Ref Range   Specimen Description BLOOD RIGHT ANTECUBITAL    Special Requests      BOTTLES DRAWN AEROBIC AND ANAEROBIC Blood Culture results may not be optimal due to an excessive volume of blood received in culture bottles   Culture      NO GROWTH < 24 HOURS Performed at [facility] [address], [place], [place] [address]    Report Status PENDING   I-Stat beta hCG blood, ED     Status: None   Collection Time: 05/13/19  9:49 AM  Result Value Ref Range   I-stat hCG, quantitative <5.0 <5 mIU/mL   Comment 3            Comment:   GEST. AGE      CONC.  (mIU/mL)   <=1 WEEK        5 - 50     2 WEEKS       50 - 500     3 WEEKS       100 - 10,000     4 WEEKS     1,000 - 30,000        FEMALE AND NON-PREGNANT FEMALE:     LESS THAN 5 mIU/mL   Troponin I (High Sensitivity)     Status: Abnormal   Collection Time: 05/13/19 12:04 PM  Result Value Ref Range   Troponin I (High Sensitivity) 165 (HH) <18 ng/L    Comment: CRITICAL RESULT CALLED TO, READ BACK BY AND VERIFIED WITH: KOPP,G RN @1309  ON [id number] BY FLEMINGS (NOTE) Elevated high sensitivity troponin I (hsTnI) values and significant  changes across serial measurements may suggest ACS but many other  chronic and acute conditions are known to elevate hsTnI results.  Refer to the Links section for chest pain algorithms and additional  guidance. Performed at [facility], [place] [address], [place], [place] [address]   Lactic acid, plasma     Status: Abnormal   Collection Time: 05/13/19 12:20 PM  Result Value Ref Range   Lactic Acid, Venous 2.4 (HH) 0.5 - 1.9 mmol/L    Comment: CRITICAL VALUE NOTED.  VALUE IS CONSISTENT WITH PREVIOUSLY REPORTED AND CALLED VALUE. Performed at [facility], [place] [address], [place], [place] [address]    Magnesium     Status: None   Collection Time: 05/13/19 12:50 PM  Result Value Ref Range   Magnesium 2.0 1.7 - 2.4 mg/dL    Comment: Performed at [facility], [place] [address], [place], [place] [address]  Hemoglobin A1c     Status: Abnormal   Collection Time: 05/13/19 12:50 PM  Result Value Ref Range   Hgb A1c MFr Bld 8.4 (H) 4.8 - 5.6 %    Comment: (NOTE) Pre diabetes:          5.7%-6.4% Diabetes:              >6.4% Glycemic control for   <7.0% adults with diabetes    Mean Plasma Glucose 194.38 mg/dL    Comment: Performed at [facility], [address], [place], [place] [address]  Urine culture     Status: Abnormal   Collection Time: 05/13/19  1:48 PM   Specimen: Urine, Clean Catch  Result Value Ref Range   Specimen Description URINE, CLEAN CATCH    Special Requests      NONE Performed at [facility], [place] [address], [place], [place] [address]    Culture MULTIPLE SPECIES PRESENT, SUGGEST RECOLLECTION (A)    Report Status 05/14/2019 FINAL   MRSA PCR Screening     Status: None   Collection Time: 05/13/19  1:59 PM   Specimen: Nasopharyngeal  Result Value Ref Range   MRSA by PCR NEGATIVE NEGATIVE    Comment:        The GeneXpert MRSA Assay (FDA approved for NASAL specimens only), is one component of a comprehensive MRSA colonization surveillance program. It is not intended to diagnose MRSA infection nor to guide or monitor treatment for MRSA infections. Performed at [facility], [place] [address], [place], [place] [address]   Urinalysis, Routine w reflex microscopic     Status: Abnormal   Collection Time: 05/13/19  2:11 PM  Result Value Ref Range   Color, Urine YELLOW YELLOW   APPearance CLOUDY (A) CLEAR   Specific Gravity, Urine 1.009 1.005 - 1.030   pH 6.0 5.0 - 8.0   Glucose, UA NEGATIVE NEGATIVE mg/dL   Hgb urine dipstick LARGE (A) NEGATIVE   Bilirubin Urine NEGATIVE NEGATIVE   Ketones, ur NEGATIVE NEGATIVE mg/dL   Protein, ur 100 (A)  NEGATIVE mg/dL   Nitrite NEGATIVE NEGATIVE   Leukocytes,Ua LARGE (A) NEGATIVE   RBC / HPF 0-5 0 - 5 RBC/hpf   WBC, UA 21-50 0 - 5 WBC/hpf   Bacteria, UA FEW (A) NONE SEEN   Squamous Epithelial / LPF 11-20 0 - 5   Mucus PRESENT    Hyaline Casts, UA PRESENT    Amorphous Crystal PRESENT     Comment: Performed at [facility], [address] [place], [place] [address]  Troponin I (High Sensitivity)     Status: Abnormal   Collection Time: 05/13/19  5:00 PM  Result Value Ref Range   Troponin I (High Sensitivity) 313 (HH) <18 ng/L    Comment: CRITICAL VALUE NOTED.  VALUE IS CONSISTENT WITH PREVIOUSLY REPORTED AND CALLED VALUE. (NOTE) Elevated high sensitivity troponin I (hsTnI) values and significant  changes across serial measurements may suggest ACS but many other  chronic and acute conditions are known to elevate hsTnI results.  Refer to the Links section for chest pain algorithms and additional  guidance. Performed at [facility], [place] [address], [place], [place] [address]   CK     Status: Abnormal   Collection Time: 05/13/19  5:00 PM  Result Value Ref Range   Total CK 31,787 (H) 38 - 234 U/L    Comment: RESULTS CONFIRMED BY MANUAL DILUTION Performed at [facility], [place] [address], [place],  [address]   CBG monitoring, ED     Status: Abnormal   Collection Time: 05/13/19  5:53 PM  Result Value Ref Range   Glucose-Capillary 126 (H) 70 - 99 mg/dL   Comment 1 Notify RN    Comment 2 Document in Chart   Troponin I (High Sensitivity)     Status: Abnormal   Collection Time: 05/13/19  6:30 PM  Result Value Ref Range   Troponin I (High Sensitivity) 330 (HH) <18 ng/L  Comment: CRITICAL VALUE NOTED.  VALUE IS CONSISTENT WITH PREVIOUSLY REPORTED AND CALLED VALUE. (NOTE) Elevated high sensitivity troponin I (hsTnI) values and significant  changes across serial measurements may suggest ACS but many other  chronic and acute conditions are known to elevate  hsTnI results.  Refer to the Links section for chest pain algorithms and additional  guidance. Performed at [facility], [place] [address] [place], [place] [address]   CBC with Differential/Platelet     Status: Abnormal   Collection Time: 05/14/19  3:38 AM  Result Value Ref Range   WBC 11.1 (H) 4.0 - 10.5 K/uL   RBC 4.42 3.87 - 5.11 MIL/uL   Hemoglobin 12.5 12.0 - 15.0 g/dL   HCT 38.9 36.0 - 46.0 %   MCV 88.0 80.0 - 100.0 fL   MCH 28.3 26.0 - 34.0 pg   MCHC 32.1 30.0 - 36.0 g/dL   RDW 15.6 (H) 11.5 - 15.5 %   Platelets 201 150 - 400 K/uL   nRBC 0.0 0.0 - 0.2 %   Neutrophils Relative % 61 %   Neutro Abs 6.6 1.7 - 7.7 K/uL   Lymphocytes Relative 29 %   Lymphs Abs 3.3 0.7 - 4.0 K/uL   Monocytes Relative 9 %   Monocytes Absolute 1.0 0.1 - 1.0 K/uL   Eosinophils Relative 1 %   Eosinophils Absolute 0.1 0.0 - 0.5 K/uL   Basophils Relative 0 %   Basophils Absolute 0.0 0.0 - 0.1 K/uL   Immature Granulocytes 0 %   Abs Immature Granulocytes 0.04 0.00 - 0.07 K/uL    Comment: Performed at [facility], [address], [place], [place] [id number]  Basic metabolic panel     Status: Abnormal   Collection Time: 05/14/19  3:38 AM  Result Value Ref Range   Sodium 145 135 - 145 mmol/L   Potassium 2.8 (L) 3.5 - 5.1 mmol/L    Comment: DELTA CHECK NOTED   Chloride 117 (H) 98 - 111 mmol/L   CO2 20 (L) 22 - 32 mmol/L   Glucose, Bld 83 70 - 99 mg/dL   BUN 14 6 - 20 mg/dL   Creatinine, Ser 1.12 (H) 0.44 - 1.00 mg/dL    Comment: DELTA CHECK NOTED   Calcium 7.7 (L) 8.9 - 10.3 mg/dL   GFR calc non Af Amer 55 (L) >60 mL/min   GFR calc Af Amer >60 >60 mL/min   Anion gap 8 5 - 15    Comment: Performed at [facility] [address] [place], [place] [address]  Hepatic function panel     Status: Abnormal   Collection Time: 05/14/19  3:38 AM  Result Value Ref Range   Total Protein 5.5 (L) 6.5 - 8.1 g/dL   Albumin 3.0 (L) 3.5 - 5.0 g/dL   AST 278 (H) 15 - 41 U/L   ALT 89 (H) 0 -  44 U/L   Alkaline Phosphatase 46 38 - 126 U/L   Total Bilirubin 1.2 0.3 - 1.2 mg/dL   Bilirubin, Direct 0.2 0.0 - 0.2 mg/dL   Indirect Bilirubin 1.0 (H) 0.3 - 0.9 mg/dL    Comment: Performed at [facility] [address], [place], [place] [address]  Troponin I (High Sensitivity)     Status: Abnormal   Collection Time: 05/14/19  3:38 AM  Result Value Ref Range   Troponin I (High Sensitivity) 501 (HH) <18 ng/L    Comment: CRITICAL VALUE NOTED.  VALUE  IS CONSISTENT WITH PREVIOUSLY REPORTED AND CALLED VALUE. (NOTE) Elevated high sensitivity troponin I (hsTnI) values and significant  changes across serial measurements may suggest ACS but many other  chronic and acute conditions are known to elevate hsTnI results.  Refer to the Links section for chest pain algorithms and additional  guidance. Performed at [facility], [place] [address] [place], [place] [address]   CBG monitoring, ED     Status: Abnormal   Collection Time: 05/14/19  4:20 AM  Result Value Ref Range   Glucose-Capillary 68 (L) 70 - 99 mg/dL  CK     Status: Abnormal   Collection Time: 05/14/19  9:16 AM  Result Value Ref Range   Total CK 39,358 (H) 38 - 234 U/L    Comment: RESULTS CONFIRMED BY MANUAL DILUTION Performed at [facility], [place] [address], [place], [place] [address]   Troponin I (High Sensitivity)     Status: Abnormal   Collection Time: 05/14/19  9:16 AM  Result Value Ref Range   Troponin I (High Sensitivity) 361 (HH) <18 ng/L    Comment: CRITICAL VALUE NOTED.  VALUE IS CONSISTENT WITH PREVIOUSLY REPORTED AND CALLED VALUE. (NOTE) Elevated high sensitivity troponin I (hsTnI) values and significant  changes across serial measurements may suggest ACS but many other  chronic and acute conditions are known to elevate hsTnI results.  Refer to the Links section for chest pain algorithms and additional  guidance. Performed at [facility], [place] [address], [place], [place] [address]    Troponin I (High Sensitivity)     Status: Abnormal   Collection Time: 05/14/19 11:23 AM  Result Value Ref Range   Troponin I (High Sensitivity) 328 (HH) <18 ng/L    Comment: CRITICAL VALUE NOTED.  VALUE IS CONSISTENT WITH PREVIOUSLY REPORTED AND CALLED VALUE. (NOTE) Elevated high sensitivity troponin I (hsTnI) values and significant  changes across serial measurements may suggest ACS but many other  chronic and acute conditions are known to elevate hsTnI results.  Refer to the Links section for chest pain algorithms and additional  guidance. Performed at [facility], [place] [address] [place] [address] [place], [place] [address]    Dg Chest 2 View  Result Date: 05/14/2019 CLINICAL DATA:  Shortness of breath.  Recent fall EXAM: CHEST - 2 VIEW COMPARISON:  May 13, 2019 FINDINGS: There is atelectatic change in the right base. There is no frank edema or consolidation. Heart size and pulmonary vascularity normal. No adenopathy. There is aortic atherosclerosis. No bone lesions. IMPRESSION: Right base atelectasis. Lungs elsewhere clear. Cardiac silhouette within normal limits. Aortic Atherosclerosis (ICD10-I70.0). Electronically Signed   By: Lowella Grip III M.D.   On: 05/14/2019 08:01   Nm Pulmonary Perfusion  Result Date: 05/13/2019 CLINICAL DATA:  Positive D-dimer, intermittent probability for PE EXAM: NUCLEAR MEDICINE PERFUSION LUNG SCAN TECHNIQUE: Perfusion images were obtained in multiple projections after intravenous injection of radiopharmaceutical. Ventilation scans intentionally deferred if perfusion scan and chest x-ray adequate for interpretation during COVID 19 epidemic. RADIOPHARMACEUTICALS:  1.6 mCi Tc-40m MAA IV COMPARISON:  Correlation with chest radiograph dated 05/13/2019 FINDINGS: Normal perfusion. Ventilation was deferred. Corresponding chest radiograph demonstrates clear lungs. IMPRESSION: Negative for pulmonary embolism. Electronically Signed   By: Julian Hy M.D.   On:  05/13/2019 15:16   Dg Chest Port 1 View  Result Date: 05/13/2019 CLINICAL DATA:  Fever, shortness of breath EXAM: PORTABLE CHEST 1 VIEW COMPARISON:  12/13/2018 FINDINGS: Cardiomegaly. Unchanged elevation or eventration of  the right hemidiaphragm with associated scarring or atelectasis. The visualized skeletal structures are unremarkable. IMPRESSION: Cardiomegaly without acute abnormality of the lungs in AP portable projection. Electronically Signed   By: Eddie Candle M.D.   On: 05/13/2019 09:36   Vas [place] Lower Extremity Venous (dvt) (only Mc & [facility])  Result Date: 05/13/2019  Lower Venous Study Indications: Edema.  Comparison Study: no prior Performing Technologist: June Leap RDMS, RVT  Examination Guidelines: A complete evaluation includes B-mode imaging, spectral Doppler, color Doppler, and power Doppler as needed of all accessible portions of each vessel. Bilateral testing is considered an integral part of a complete examination. Limited examinations for reoccurring indications may be performed as noted.  +---------+---------------+---------+-----------+----------+--------------+ RIGHT    CompressibilityPhasicitySpontaneityPropertiesThrombus Aging +---------+---------------+---------+-----------+----------+--------------+ CFV      Full           Yes      Yes                                 +---------+---------------+---------+-----------+----------+--------------+ SFJ      Full                                                        +---------+---------------+---------+-----------+----------+--------------+ FV Prox  Full                                                        +---------+---------------+---------+-----------+----------+--------------+ FV Mid   Full                                                        +---------+---------------+---------+-----------+----------+--------------+ FV DistalFull                                                         +---------+---------------+---------+-----------+----------+--------------+ PFV      Full                                                        +---------+---------------+---------+-----------+----------+--------------+ POP      Full           Yes      Yes                                 +---------+---------------+---------+-----------+----------+--------------+ PTV      Full                                                        +---------+---------------+---------+-----------+----------+--------------+  PERO     Full                                                        +---------+---------------+---------+-----------+----------+--------------+   +---------+---------------+---------+-----------+----------+--------------+ LEFT     CompressibilityPhasicitySpontaneityPropertiesThrombus Aging +---------+---------------+---------+-----------+----------+--------------+ CFV      Full           Yes      Yes                                 +---------+---------------+---------+-----------+----------+--------------+ SFJ      Full                                                        +---------+---------------+---------+-----------+----------+--------------+ FV Prox  Full                                                        +---------+---------------+---------+-----------+----------+--------------+ FV Mid   Full                                                        +---------+---------------+---------+-----------+----------+--------------+ FV DistalFull                                                        +---------+---------------+---------+-----------+----------+--------------+ PFV      Full                                                        +---------+---------------+---------+-----------+----------+--------------+ POP      Full           Yes      Yes                                  +---------+---------------+---------+-----------+----------+--------------+ PTV      Full                                                        +---------+---------------+---------+-----------+----------+--------------+ PERO     Full                                                        +---------+---------------+---------+-----------+----------+--------------+  Summary: Right: There is no evidence of deep vein thrombosis in the lower extremity. A cystic structure is found in the popliteal fossa. Left: There is no evidence of deep vein thrombosis in the lower extremity. A cystic structure is found in the popliteal fossa.  *See table(s) above for measurements and observations. Electronically signed by Deitra Mayo MD on 05/13/2019 at 4:28:55 PM.    Final    US Abdomen Limited Ruq  Result Date: 05/14/2019 CLINICAL DATA:  Transaminitis EXAM: ULTRASOUND ABDOMEN LIMITED RIGHT UPPER QUADRANT COMPARISON:  None. FINDINGS: Gallbladder: No gallstones or wall thickening visualized. No sonographic Murphy sign noted by sonographer. Common bile duct: Diameter: Normal caliber, 5 mm Liver: No focal lesion identified. Within normal limits in parenchymal echogenicity. Portal vein is patent on color Doppler imaging with normal direction of blood flow towards the liver. Other: None. IMPRESSION: Unremarkable right upper quadrant ultrasound. Electronically Signed   By: Rolm Baptise M.D.   On: 05/14/2019 08:59    Pending Labs Unresulted Labs (From admission, onward)   None      Vitals/Pain Today's Vitals   05/14/19 0945 05/14/19 1030 05/14/19 1045 05/14/19 1100  BP:  127/60 115/66 113/63  Pulse: 79 (!) 53 (!) 51 (!) 56  Resp: (!) 25 18 18 18   Temp:      TempSrc:      SpO2: 97% 92% 93% 96%  Weight:      Height:      PainSc:        Isolation Precautions No active isolations  Medications Medications  metoprolol tartrate (LOPRESSOR) tablet 25 mg (25 mg Oral Given 05/14/19 0946)   clopidogrel (PLAVIX) tablet 75 mg (75 mg Oral Given 05/14/19 0946)  acetaminophen (TYLENOL) tablet 650 mg (has no administration in time range)    Or  acetaminophen (TYLENOL) suppository 650 mg (has no administration in time range)  senna (SENOKOT) tablet 8.6 mg (8.6 mg Oral Given 05/14/19 0946)  polyethylene glycol (MIRALAX / GLYCOLAX) packet 17 g (has no administration in time range)  aspirin EC tablet 81 mg (81 mg Oral Given 05/14/19 0946)  insulin aspart (novoLOG) injection 0-9 Units (0 Units Subcutaneous Not Given 05/14/19 1312)  0.9 %  sodium chloride infusion ( Intravenous Rate/Dose Verify 05/14/19 0815)  potassium chloride SA (KLOR-CON) CR tablet 40 mEq (40 mEq Oral Not Given 05/14/19 0943)  cefTRIAXone (ROCEPHIN) 1 g in sodium chloride 0.9 % 100 mL IVPB (0 g Intravenous Stopped 05/14/19 1202)  azithromycin (ZITHROMAX) 500 mg in sodium chloride 0.9 % 250 mL IVPB (500 mg Intravenous New Bag/Given 05/14/19 1203)  enoxaparin (LOVENOX) injection 40 mg (has no administration in time range)  sodium chloride 0.9 % bolus 1,000 mL (0 mLs Intravenous Stopped 05/13/19 1041)  acetaminophen (TYLENOL) tablet 650 mg (650 mg Oral Given 05/13/19 0936)  doxycycline (VIBRA-TABS) tablet 100 mg (100 mg Oral Given 05/13/19 1219)  sodium chloride 0.9 % bolus 1,500 mL (0 mLs Intravenous Stopped 05/13/19 1856)  sodium chloride 0.9 % bolus 1,000 mL (0 mLs Intravenous Stopped 05/13/19 1856)  vancomycin (VANCOCIN) 1,750 mg in sodium chloride 0.9 % 500 mL IVPB (0 mg Intravenous Stopped 05/13/19 1643)  ceFEPIme (MAXIPIME) 2 g in sodium chloride 0.9 % 100 mL IVPB (0 g Intravenous Stopped 05/13/19 1856)  technetium albumin aggregated (MAA) injection solution 1.6 millicurie (1.6 millicuries Intravenous Contrast Given 05/13/19 1436)    Mobility walks     Focused Assessments Cardiac Assessment Handoff:    Lab Results  Component Value Date   CKTOTAL 39,358 (  H) 05/14/2019   TROPONINI 5.25 (HH) 12/17/2018   Lab Results   Component Value Date   DDIMER 2.78 (H) 05/13/2019   Does the Patient currently have chest pain? No     R Recommendations: See Admitting Provider Note  Report given to:   Additional Notes:

## 2019-05-14 NOTE — Progress Notes (Addendum)
Family Medicine Teaching Service Daily Progress Note Intern Pager: [phone]  Patient name: Krista [name] Medical record number: [id number] Date of birth: 02/17/1961 Age: 58 y.o. Gender: female  Primary Care Provider: Mardi Mainland, FNP Consultants: Cardiology Code Status: Full  Pt Overview and Major Events to Date:  9/29: Admitted with sirs criteria 9/30: narrowed to CTX and azithro for ?CAP  Assessment and Plan:  Fever of unknown origin meeting  SIRS criteria Patient with temp of 102, WBC elevated to 13.4, and tachypnea on admission. Lactic Acid initially elevated 5.2>2.4. WBC this a.m. 11.1. VSS since admission, afebrile overnight. Rhabdomyolysis may also cause fever, but patient also with respiratory symptoms initially. VQ scan negative for PE. UA with large hemoglobin and large leukocytes however also evidence of dirty catch and patient not having urinary symptoms. Repeat CXR this am wnl. Patient with no other etiology for initial respiratory distress, will empirically cover for pneumonia given respiratory improvement after abx. COVID neg. No other symptoms of viral illness. - discontinue Vanc and Cefepime; switch abx for CAP coverage with CTX (9/30-) and azithro (9/30-)  - F/u BCx and UCx  - mIVF at 125mL/hr - monitor for signs of infection - consider RVP if more concern for URI  Rhabdomyolysis CK elevated to 31,787.  Unclear etiology as patient reports that she was only down for 1 hour and has not done strenuous work recently. May be statin induced. No other symptoms concerning for viral illness (covid neg). Patient not having urine output monitored overnight. Will begin to monitor and adjust fluids based on output.  She does report frequent urination. -s/p 3.5L, mIVF increased to 148mL/hr -Strict I's and O's -holding statin -trend CK  Transaminitis Likely due to rhabdomyolysis as above.  RUQ WNL. AST/ALT 100/67>278/89. denies alcohol use. - Consider hepatitis  panel outpatient if remains elevated after resolution of rhabdo.  - continue to monitor  Acute Hypoxic Respiratory Failure.  Resolved. Not on home oxygen.  Required 2L [place] on admission, but now on room air satting 98%. CXR showed no acute changes from last admission and VQ scan was negative for PE. Doppler LE U/S negative. Echocardiogram with EF 60 to 65%, severe LVH. - Cardiology consulted; appreciate recs - Trend Troponins - CAP coverage with CTX and azithro (9/30-)  CAD s/p stent placement - Cont Plavix and ASA  AKI. Improving. Baseline appears to be around 0.7-0.9. Most likely prerenal secondary to dehydration as well as rhabdomyolysis. Cr 2.21>1.12 - s/p 3.5L total fluid bolus, on mIVF 175/mL/hr - Trend BMP / urinary output, strict I's and O's - Replace electrolytes as indicated - Avoid nephrotoxic agents, ensure adequate renal perfusion   DM. Chronic. Stable with last A1c on 12/2018 at 7.2% - holding home metformin  - sSSI with no basal or meal time coverage correction - CBG checks in am with meal time checks  HTN. Chronic. Stable.  - cont metop; unlikely she has acute MI but would be protective - holding lisinopril due to AKI  HLD. Chronic. Last lipid panel  - cont atorvastatin   Tobacco Use Disorder. Chronic. Smokes about 2 packs per week for over 30 years. - Offer cessation counseling - Offer nicotine replacement therapy while in hospital  FEN/GI: NPO with sips with meds, if no cath will give diet PPx: Heparin subcutaneous  Disposition: Pending improvement  Subjective:  Patient tired and hungry as she has been in the ED since arrival.  Objective: Temp:  [98.5 F (36.9 C)-102.4 F (39.1 C)] 98.5 F (  36.9 C) (09/29 2339) Pulse Rate:  [53-135] 77 (09/30 0800) Resp:  [16-38] 18 (09/30 0800) BP: (100-146)/(58-114) 118/75 (09/30 0800) SpO2:  [86 %-100 %] 97 % (09/30 0800) Weight:  [81.6 kg] 81.6 kg (09/29 0904) Physical Exam: General: NAD,  pleasant Cardiovascular: RRR, no m/r/g, no LE edema Respiratory: CTA BL, normal work of breathing Gastrointestinal: soft, nontender, nondistended, normoactive BS MSK: moves 4 extremities equally Derm: no rashes appreciated Neuro: CN II-XII grossly intact Psych: AOx3, appropriate affect  Laboratory: Recent Labs  Lab 05/13/19 0911 05/14/19 0338  WBC 13.4* 11.1*  HGB 15.2* 12.5  HCT 46.4* 38.9  PLT 343 201   Recent Labs  Lab 05/13/19 0911 05/14/19 0338  NA 144 145  K 3.9 2.8*  CL 109 117*  CO2 19* 20*  BUN 21* 14  CREATININE 2.21* 1.12*  CALCIUM 9.6 7.7*  PROT 8.0 5.5*  BILITOT 1.0 1.2  ALKPHOS 67 46  ALT 67* 89*  AST 100* 278*  GLUCOSE 293* 83   CK 31,787  Imaging/Diagnostic Tests: Dg Chest 2 View Result Date: 05/14/2019 CLINICAL DATA:  Shortness of breath.  Recent fall EXAM: CHEST - 2 VIEW COMPARISON:  May 13, 2019 FINDINGS: There is atelectatic change in the right base. There is no frank edema or consolidation. Heart size and pulmonary vascularity normal. No adenopathy. There is aortic atherosclerosis. No bone lesions. IMPRESSION: Right base atelectasis. Lungs elsewhere clear. Cardiac silhouette within normal limits. Aortic Atherosclerosis (ICD10-I70.0). Electronically Signed   By: Lowella Grip III M.D.   On: 05/14/2019 08:01   Nm Pulmonary Perfusion Result Date: 05/13/2019 CLINICAL DATA:  Positive D-dimer, intermittent probability for PE EXAM: NUCLEAR MEDICINE PERFUSION LUNG SCAN TECHNIQUE: Perfusion images were obtained in multiple projections after intravenous injection of radiopharmaceutical. Ventilation scans intentionally deferred if perfusion scan and chest x-ray adequate for interpretation during COVID 19 epidemic. RADIOPHARMACEUTICALS:  1.6 mCi Tc-79m MAA IV COMPARISON:  Correlation with chest radiograph dated 05/13/2019 FINDINGS: Normal perfusion. Ventilation was deferred. Corresponding chest radiograph demonstrates clear lungs. IMPRESSION: Negative  for pulmonary embolism. Electronically Signed   By: Julian Hy M.D.   On: 05/13/2019 15:16   Dg Chest Port 1 View Result Date: 05/13/2019 CLINICAL DATA:  Fever, shortness of breath EXAM: PORTABLE CHEST 1 VIEW COMPARISON:  12/13/2018 FINDINGS: Cardiomegaly. Unchanged elevation or eventration of the right hemidiaphragm with associated scarring or atelectasis. The visualized skeletal structures are unremarkable. IMPRESSION: Cardiomegaly without acute abnormality of the lungs in AP portable projection. Electronically Signed   By: Eddie Candle M.D.   On: 05/13/2019 09:36   Vas [place] Lower Extremity Venous (dvt) (only Mc & Wl) Result Date: 05/13/2019: Summary: Right: There is no evidence of deep vein thrombosis in the lower extremity. A cystic structure is found in the popliteal fossa. Left: There is no evidence of deep vein thrombosis in the lower extremity. A cystic structure is found in the popliteal fossa.  *See table(s) above for measurements and observations. Electronically signed by Deitra Mayo MD on 05/13/2019 at 4:28:55 PM.    Final     Evalise Abruzzese, [place], DO 05/14/2019, 8:42 AM PGY-3, [place] Intern pager: [phone], text pages welcome

## 2019-05-14 NOTE — ED Notes (Signed)
Report given to [facility], [place] on 6E

## 2019-05-14 NOTE — ED Notes (Signed)
bs 76

## 2019-05-14 NOTE — ED Notes (Signed)
Pt is NSR on monitor 

## 2019-05-14 NOTE — Discharge Summary (Signed)
[facility] Discharge Summary  Patient name: Krista [name] Medical record number: [id number] Date of birth: 1961-01-10 Age: 58 y.o. Gender: female Date of Admission: 05/13/2019  Date of Discharge: 05/16/2019 Admitting Physician: Leeanne Rio, MD  Primary Care Provider: Mardi Mainland, [place] Consultants: none  Indication for Hospitalization: chest pain, SOB  Discharge Diagnoses/Problem List:  Fever of unknown origin meeting SIRS criteria Rhabdomyolysis Transaminitis Acute hypoxic respiratory failure CAD s/p stent placement AKI T2DM Hypertension Hyperlipidemia Tobacco use disorder  Disposition: Home with home health PT   Discharge Condition: improved   Discharge Exam:  General: Alert and cooperative and appears to be in no acute distress Cardio: Normal S1 and S2, no S3 or S4. Rhythm is regular. No murmurs or rubs.   Pulm: no crackles, wheezing, patient exhibits diminished breath sounds on bilateral basilar regions that has improved from prior pulmonary auscultation exam, normal respiratory effort, patient stable on room air Abdomen: Bowel sounds normal. Abdomen soft and non-tender.  Extremities: Improved edema on medial aspect of left knee. Warm/ well perfused.  Neuro: Patient is alert and oriented x3  Brief Hospital Course:  58 year old female who presented with chest pain on exertion.  Patient recently had CAD with stent placement.  In the ED patient met Sirs criteria with fever to 102, WBC of 13.4 and tachypnea.  LA admitted to 5.2.  Patient received 3.5 L normal saline bolus in the ED with improvement of lactic acid to 2.4.  CK was obtained given that patient had history of being on the ground for 1 hour which was found to be elevated to 31, 787 indicating rhabdomyolysis.  Unclear etiology but thought likely to be due to patient's statin therapy.  Statin was held during admission.  With AKI on admission which improved with IVF.  Also  noted to have transaminitis likely secondary to rhabdomyolysis, improved during admission with CK of 9000 upon discharge. Patient was also noted to have elevated troponin and cardiology was consulted. Cardiology believed this was due to demand increase in the setting of rhabdomyolysis and recommended trending her troponin which down trended appropriately prior to discharge.   Issues for Follow Up:  1. Statin held during admission given rhabdomyolysis, suggest restarting statin when clinically appropriate and safe 2. Recommend following up with patient on muscle soreness 3. Consider obtaining basic metabolic panel to assess for creatinine elevation 4. Also recommend following up with patient on knee pain  Significant Procedures: none  Significant Labs and Imaging:  Recent Labs  Lab 05/13/19 0911 05/14/19 0338 05/15/19 0849  WBC 13.4* 11.1* 7.2  HGB 15.2* 12.5 13.1  HCT 46.4* 38.9 40.7  PLT 343 201 235   Recent Labs  Lab 05/13/19 0911 05/13/19 1250 05/14/19 0338 05/15/19 0849 05/15/19 1121 05/16/19 0335  NA 144  --  145 141 140 139  K 3.9  --  2.8* 4.0 3.9 3.9  CL 109  --  117* 112* 113* 107  CO2 19*  --  20* 20* 20* 25  GLUCOSE 293*  --  83 245* 210* 181*  BUN 21*  --  14 5* 5* 5*  CREATININE 2.21*  --  1.12* 0.83 0.77 0.68  CALCIUM 9.6  --  7.7* 8.1* 8.0* 8.4*  MG  --  2.0  --   --   --   --   ALKPHOS 67  --  46 53  --  55  AST 100*  --  278* 200*  --  143*  ALT 67*  --  89* 111*  --  96*  ALBUMIN 4.2  --  3.0* 3.1*  --  3.0*    ECHO 05/13/2019:  1. Left ventricular ejection fraction, by visual estimation, is 60 to 65%. The left ventricle has normal function. Normal left ventricular size. There is severely increased left ventricular hypertrophy.  2. Left ventricular diastolic Doppler parameters are consistent with pseudonormalization pattern of LV diastolic filling.  3. Global right ventricle has normal systolic function.The right ventricular size is normal.  4. Left  atrial size was normal.  5. Right atrial size was normal.  6. The mitral valve is normal in structure. No evidence of mitral valve regurgitation. No evidence of mitral stenosis.  7. The tricuspid valve is normal in structure. Tricuspid valve regurgitation is mild.  8. The aortic valve is tricuspid Aortic valve regurgitation is trivial by color flow Doppler. Mild aortic valve sclerosis without stenosis.  9. The pulmonic valve was normal in structure. Pulmonic valve regurgitation is not visualized by color flow Doppler. 10. Aortic dilatation noted. 11. There is mild dilatation of the ascending aorta measuring 38 mm. 12. Mildly elevated pulmonary artery systolic pressure. 13. The inferior vena cava is dilated in size with >50% respiratory variability, suggesting right atrial pressure of 8 mmHg. 14. Normal LV systolic function; grade 2 diastolic dysfunction; moderate to severe LVH; mildly dilated ascending aorta; trace AI; mild TR.  Dg Chest 2 View Result Date: 05/14/2019 CLINICAL DATA:  Shortness of breath.  Recent fall EXAM: CHEST - 2 VIEW COMPARISON:  May 13, 2019 FINDINGS: There is atelectatic change in the right base. There is no frank edema or consolidation. Heart size and pulmonary vascularity normal. No adenopathy. There is aortic atherosclerosis. No bone lesions. IMPRESSION: Right base atelectasis. Lungs elsewhere clear. Cardiac silhouette within normal limits. Aortic Atherosclerosis (ICD10-I70.0). Electronically Signed   By: Lowella Grip III M.D.   On: 05/14/2019 08:01   Nm Pulmonary Perfusion Result Date: 05/13/2019 CLINICAL DATA:  Positive D-dimer, intermittent probability for PE EXAM: NUCLEAR MEDICINE PERFUSION LUNG SCAN TECHNIQUE: Perfusion images were obtained in multiple projections after intravenous injection of radiopharmaceutical. Ventilation scans intentionally deferred if perfusion scan and chest x-ray adequate for interpretation during COVID 19 epidemic.  RADIOPHARMACEUTICALS:  1.6 mCi Tc-48mMAA IV COMPARISON:  Correlation with chest radiograph dated 05/13/2019 FINDINGS: Normal perfusion. Ventilation was deferred. Corresponding chest radiograph demonstrates clear lungs. IMPRESSION: Negative for pulmonary embolism. Electronically Signed   By: SJulian HyM.D.   On: 05/13/2019 15:16   Dg Chest Port 1 View Result Date: 05/13/2019 CLINICAL DATA:  Fever, shortness of breath EXAM: PORTABLE CHEST 1 VIEW COMPARISON:  12/13/2018 FINDINGS: Cardiomegaly. Unchanged elevation or eventration of the right hemidiaphragm with associated scarring or atelectasis. The visualized skeletal structures are unremarkable. IMPRESSION: Cardiomegaly without acute abnormality of the lungs in AP portable projection. Electronically Signed   By: AEddie CandleM.D.   On: 05/13/2019 09:36   Vas [place] Extremity Venous (dvt) (only Mc & Wl) Result Date: 05/13/2019: Summary: Right: There is no evidence of deep vein thrombosis in the lower extremity. A cystic structure is found in the popliteal fossa. Left: There is no evidence of deep vein thrombosis in the lower extremity. A cystic structure is found in the popliteal fossa.  *See table(s) above for measurements and observations. Electronically signed by CDeitra MayoMD on 05/13/2019 at 4:28:55 PM.    Final    RUQ [place]9/30/2020: Unremarkable right upper quadrant ultrasound.  Results/Tests Pending at  Time of Discharge:   Discharge Medications:  Allergies as of 05/16/2019   No Known Allergies     Medication List    STOP taking these medications   atorvastatin 80 MG tablet Commonly known as: LIPITOR     TAKE these medications   acetaminophen 500 MG tablet Commonly known as: TYLENOL Take 1,000 mg by mouth every 6 (six) hours as needed for mild pain or headache.   aspirin 81 MG EC tablet Take 1 tablet (81 mg total) by mouth daily. Start taking on: May 17, 2019   clopidogrel 75 MG tablet Commonly known as:  Plavix Take 1 tablet (75 mg total) by mouth daily. TAKE A LOADING DOSE OF 600 MG (8 TABLETS) ON Thursday 02/13/2019. STARTING Friday 02/14/2019 TAKE 1 TABLET (75MG) ON A DAILY BASIS.   lisinopril 10 MG tablet Commonly known as: ZESTRIL Take 1 tablet (10 mg total) by mouth daily.   loratadine 10 MG tablet Commonly known as: CLARITIN Take 10 mg by mouth daily.   metFORMIN 1000 MG tablet Commonly known as: GLUCOPHAGE Take 1,000 mg by mouth 2 (two) times daily with a meal.   metoprolol tartrate 25 MG tablet Commonly known as: LOPRESSOR Take 1 tablet (25 mg total) by mouth 2 (two) times daily.   nitroGLYCERIN 0.4 MG SL tablet Commonly known as: NITROSTAT Place 1 tablet (0.4 mg total) under the tongue every 5 (five) minutes as needed for chest pain.            Durable Medical Equipment  (From admission, onward)         Start     Ordered   05/15/19 1052  For home use only DME 3 n 1  Once     05/15/19 1051   05/15/19 1003  For home use only DME Tub bench  Once     05/15/19 1002   05/14/19 1859  For home use only DME Walker rolling  Once    Question:  Patient needs a walker to treat with the following condition  Answer:  Dyspnea on exertion   05/14/19 1859          Discharge Instructions: Please refer to Patient Instructions section of EMR for full details.  Patient was counseled important signs and symptoms that should prompt return to medical care, changes in medications, dietary instructions, activity restrictions, and follow up appointments.   Follow-Up Appointments: Follow-up Information    Almyra Deforest, [place] Follow up on 06/04/2019.   Specialties: Cardiology, Radiology Why: Please go to hospital follow-up on October 21 at 10:30 AM Contact information: [place] [address] [place], [place], [place]. Call.   Specialty: Nurse Practitioner Contact information: [address] [place]  [address] [phone]        Lorretta Harp, MD .   Specialties: Cardiology, Radiology Contact information: [address] [place] [address] [phone]        [facility] Follow up.   Specialty: Rehabilitation Why: Physical Therapy and Occupational Therapy-office to call you with a visit time.  Contact information: [address] [phone] mc [place]          Stark Klein, MD 05/16/2019, 5:20 PM PGY-1, [place]

## 2019-05-14 NOTE — Evaluation (Signed)
Physical Therapy Evaluation Patient Details Name: Krista [name] MRN: [id number] DOB: November 07, 1960 Today's Date: 05/14/2019   History of Present Illness  58 Y/O F with PMX of severe CAD s/p stent placement, NSTEMI, DM2, HTN, presented with chest pain and SoB after fall in which knees buckled. Admitted 05/13/19 with elevated troponins and AKI.   Clinical Impression  PTA pt taking care of parents in single story home with ramped entrance. Pt independent with ADLs, iADLs. Pt is currently limited in safe mobility by bilateral knee pain in presence of generalized weakness. Pt is supervision for bed mobility, min guard for transfers and ambulation 20 feet with RW. Pt experiences 3/4 DoE with short distance ambulation. PT recommends HHPT level rehab at discharge to improve strength and endurance. PT will continue to follow acutely.     Follow Up Recommendations Home health PT;Supervision - Intermittent    Equipment Recommendations  Rolling walker with 5" wheels    Recommendations for Other Services       Precautions / Restrictions Precautions Precautions: Fall Restrictions Weight Bearing Restrictions: No      Mobility  Bed Mobility Overal bed mobility: Needs Assistance Bed Mobility: Supine to Sit;Sit to Supine     Supine to sit: Supervision Sit to supine: Supervision   General bed mobility comments: supervision for safety, increased time and effort with use of bedrail  Transfers Overall transfer level: Needs assistance Equipment used: Rolling walker (2 wheeled) Transfers: Sit to/from Stand Sit to Stand: Min guard         General transfer comment: min guard for safety, increased time and effort, incrased UE support in standing  Ambulation/Gait Ambulation/Gait assistance: Min guard Gait Distance (Feet): 20 Feet Assistive device: Rolling walker (2 wheeled) Gait Pattern/deviations: Step-through pattern;Decreased step length - right;Decreased step length - left;Shuffle Gait  velocity: slowed Gait velocity interpretation: <1.31 ft/sec, indicative of household ambulator General Gait Details: min guard for shuffling gait with decreased knee flexion and increased UE support on RW, vc for proximity to RW, 3/4 DoE with short distance ambulation         Balance Overall balance assessment: Needs assistance Sitting-balance support: No upper extremity supported;Feet supported Sitting balance-Leahy Scale: Good     Standing balance support: Bilateral upper extremity supported;Single extremity supported Standing balance-Leahy Scale: Poor Standing balance comment: requires UE support due to pain                              Pertinent Vitals/Pain Pain Assessment: 0-10 Pain Score: 8  Pain Location: bilateral knees Pain Descriptors / Indicators: Aching;Throbbing Pain Intervention(s): Limited activity within patient's tolerance;Monitored during session;Repositioned;Patient requesting pain meds-RN notified    Home Living Family/patient expects to be discharged to:: Private residence Living Arrangements: Parent Available Help at Discharge: Family;Available PRN/intermittently Type of Home: House Home Access: Ramped entrance     Home Layout: One level Home Equipment: None      Prior Function Level of Independence: Independent         Comments: takes care of her parents     Hand Dominance        Extremity/Trunk Assessment   Upper Extremity Assessment Upper Extremity Assessment: Overall WFL for tasks assessed    Lower Extremity Assessment Lower Extremity Assessment: RLE deficits/detail;LLE deficits/detail RLE Deficits / Details: difficult to assess hip and knee ROM due to increased pain knees lack full extension/flexion. ankle ROM and strength WFL RLE: Unable to fully assess due to pain  RLE Coordination: decreased gross motor;decreased fine motor LLE Deficits / Details: difficult to assess hip and knee ROM due to increased pain knees  lack full extension/flexion. ankle ROM and strength WFL LLE: Unable to fully assess due to pain LLE Coordination: decreased fine motor;decreased gross motor       Communication   Communication: No difficulties  Cognition Arousal/Alertness: Lethargic(sleepy, falls asleep between questions about home setup) Behavior During Therapy: WFL for tasks assessed/performed Overall Cognitive Status: Within Functional Limits for tasks assessed                                        General Comments General comments (skin integrity, edema, etc.): bilateral knees swollen, and warm to touch, pt with pain with active and passive ROM. VSS on RA        Assessment/Plan    PT Assessment Patient needs continued PT services  PT Problem List Decreased activity tolerance;Decreased mobility;Pain;Decreased strength       PT Treatment Interventions DME instruction;Gait training;Functional mobility training;Therapeutic activities;Therapeutic exercise;Balance training;Cognitive remediation;Patient/family education    PT Goals (Current goals can be found in the Care Plan section)  Acute Rehab PT Goals Patient Stated Goal: have less pain PT Goal Formulation: With patient Time For Goal Achievement: 05/28/19 Potential to Achieve Goals: Good    Frequency Min 3X/week    AM-PAC PT "6 Clicks" Mobility  Outcome Measure Help needed turning from your back to your side while in a flat bed without using bedrails?: None Help needed moving from lying on your back to sitting on the side of a flat bed without using bedrails?: None Help needed moving to and from a bed to a chair (including a wheelchair)?: A Little Help needed standing up from a chair using your arms (e.g., wheelchair or bedside chair)?: A Little Help needed to walk in hospital room?: A Little Help needed climbing 3-5 steps with a railing? : A Lot 6 Click Score: 19    End of Session Equipment Utilized During Treatment: Gait  belt Activity Tolerance: Patient limited by pain Patient left: in bed;with call bell/phone within reach;with bed alarm set;with family/visitor present Nurse Communication: Mobility status;Patient requests pain meds PT Visit Diagnosis: Unsteadiness on feet (R26.81);Other abnormalities of gait and mobility (R26.89);Muscle weakness (generalized) (M62.81);Difficulty in walking, not elsewhere classified (R26.2);Pain Pain - Right/Left: (bilateral knees) Pain - part of body: Knee    Time: [id number] PT Time Calculation (min) (ACUTE ONLY): 18 min   Charges:   PT Evaluation $PT Eval Moderate Complexity: 1 Mod          Brycelynn Stampley B. Migdalia Dk PT, DPT Acute Rehabilitation Services Pager [phone] Office [phone]   [place] 05/14/2019, 4:29 PM

## 2019-05-14 NOTE — Progress Notes (Addendum)
Progress Note  Patient Name: Krista [name] Date of Encounter: 05/14/2019  Primary Cardiologist: Quay Burow, MD   Subjective   Doing well, denies chest pain, shortness of breath despite further troponin elevation.  Inpatient Medications    Scheduled Meds:  aspirin EC  81 mg Oral Daily   atorvastatin  80 mg Oral q1800   clopidogrel  75 mg Oral Daily   heparin  5,000 Units Subcutaneous Q8H   insulin aspart  0-9 Units Subcutaneous TID WC   metoprolol tartrate  25 mg Oral BID   senna  1 tablet Oral BID   Continuous Infusions:  sodium chloride 125 mL/hr at 05/14/19 0512   ceFEPime (MAXIPIME) IV     [START ON 05/15/2019] vancomycin     PRN Meds: acetaminophen **OR** acetaminophen, polyethylene glycol   Vital Signs    Vitals:   05/14/19 0300 05/14/19 0330 05/14/19 0400 05/14/19 0600  BP: 135/79 135/85 128/81   Pulse: 66 (!) 55 73 65  Resp: 19 19 20 19   Temp:      TempSrc:      SpO2: 93% 94% (!) 86% 95%  Weight:      Height:        Intake/Output Summary (Last 24 hours) at 05/14/2019 0703 Last data filed at 05/13/2019 1856 Gross per 24 hour  Intake 4200 ml  Output --  Net 4200 ml   Filed Weights   05/13/19 0904  Weight: 81.6 kg    Physical Exam   General: Well developed, well nourished, NAD Neck: Negative for carotid bruits. No JVD Lungs:Clear to ausculation bilaterally. No wheezes, rales, or rhonchi. Breathing is unlabored. Cardiovascular: RRR with S1 S2. No murmur Abdomen: Soft, non-tender, non-distended. No obvious abdominal masses. Extremities: No edema. No clubbing or cyanosis. DP pulses 1+ bilaterally Neuro: Alert and oriented. No focal deficits. No facial asymmetry. MAE spontaneously. Psych: Responds to questions appropriately with normal affect.    Labs    Chemistry Recent Labs  Lab 05/13/19 0911 05/14/19 0338  NA 144 145  K 3.9 2.8*  CL 109 117*  CO2 19* 20*  GLUCOSE 293* 83  BUN 21* 14  CREATININE 2.21* 1.12*   CALCIUM 9.6 7.7*  PROT 8.0 5.5*  ALBUMIN 4.2 3.0*  AST 100* 278*  ALT 67* 89*  ALKPHOS 67 46  BILITOT 1.0 1.2  GFRNONAA 24* 55*  GFRAA 28* >60  ANIONGAP 16* 8     Hematology Recent Labs  Lab 05/13/19 0911 05/14/19 0338  WBC 13.4* 11.1*  RBC 5.38* 4.42  HGB 15.2* 12.5  HCT 46.4* 38.9  MCV 86.2 88.0  MCH 28.3 28.3  MCHC 32.8 32.1  RDW 15.3 15.6*  PLT 343 201    Cardiac EnzymesNo results for input(s): TROPONINI in the last 168 hours. No results for input(s): TROPIPOC in the last 168 hours.   BNPNo results for input(s): BNP, PROBNP in the last 168 hours.   DDimer  Recent Labs  Lab 05/13/19 0911  DDIMER 2.78*     Radiology    Nm Pulmonary Perfusion  Result Date: 05/13/2019 CLINICAL DATA:  Positive D-dimer, intermittent probability for PE EXAM: NUCLEAR MEDICINE PERFUSION LUNG SCAN TECHNIQUE: Perfusion images were obtained in multiple projections after intravenous injection of radiopharmaceutical. Ventilation scans intentionally deferred if perfusion scan and chest x-ray adequate for interpretation during COVID 19 epidemic. RADIOPHARMACEUTICALS:  1.6 mCi Tc-75m MAA IV COMPARISON:  Correlation with chest radiograph dated 05/13/2019 FINDINGS: Normal perfusion. Ventilation was deferred. Corresponding chest radiograph demonstrates  clear lungs. IMPRESSION: Negative for pulmonary embolism. Electronically Signed   By: Julian Hy M.D.   On: 05/13/2019 15:16   Dg Chest Port 1 View  Result Date: 05/13/2019 CLINICAL DATA:  Fever, shortness of breath EXAM: PORTABLE CHEST 1 VIEW COMPARISON:  12/13/2018 FINDINGS: Cardiomegaly. Unchanged elevation or eventration of the right hemidiaphragm with associated scarring or atelectasis. The visualized skeletal structures are unremarkable. IMPRESSION: Cardiomegaly without acute abnormality of the lungs in AP portable projection. Electronically Signed   By: Eddie Candle M.D.   On: 05/13/2019 09:36   Vas [place] Lower Extremity Venous (dvt) (only  [facility])  Result Date: 05/13/2019  Lower Venous Study Indications: Edema.  Comparison Study: no prior Performing Technologist: June Leap RDMS, RVT  Examination Guidelines: A complete evaluation includes B-mode imaging, spectral Doppler, color Doppler, and power Doppler as needed of all accessible portions of each vessel. Bilateral testing is considered an integral part of a complete examination. Limited examinations for reoccurring indications may be performed as noted.  +---------+---------------+---------+-----------+----------+--------------+  RIGHT     Compressibility Phasicity Spontaneity Properties Thrombus Aging  +---------+---------------+---------+-----------+----------+--------------+  CFV       Full            Yes       Yes                                    +---------+---------------+---------+-----------+----------+--------------+  SFJ       Full                                                             +---------+---------------+---------+-----------+----------+--------------+  FV Prox   Full                                                             +---------+---------------+---------+-----------+----------+--------------+  FV Mid    Full                                                             +---------+---------------+---------+-----------+----------+--------------+  FV Distal Full                                                             +---------+---------------+---------+-----------+----------+--------------+  PFV       Full                                                             +---------+---------------+---------+-----------+----------+--------------+  POP  Full            Yes       Yes                                    +---------+---------------+---------+-----------+----------+--------------+  PTV       Full                                                             +---------+---------------+---------+-----------+----------+--------------+  PERO      Full                                                              +---------+---------------+---------+-----------+----------+--------------+   +---------+---------------+---------+-----------+----------+--------------+  LEFT      Compressibility Phasicity Spontaneity Properties Thrombus Aging  +---------+---------------+---------+-----------+----------+--------------+  CFV       Full            Yes       Yes                                    +---------+---------------+---------+-----------+----------+--------------+  SFJ       Full                                                             +---------+---------------+---------+-----------+----------+--------------+  FV Prox   Full                                                             +---------+---------------+---------+-----------+----------+--------------+  FV Mid    Full                                                             +---------+---------------+---------+-----------+----------+--------------+  FV Distal Full                                                             +---------+---------------+---------+-----------+----------+--------------+  PFV       Full                                                             +---------+---------------+---------+-----------+----------+--------------+  POP       Full            Yes       Yes                                    +---------+---------------+---------+-----------+----------+--------------+  PTV       Full                                                             +---------+---------------+---------+-----------+----------+--------------+  PERO      Full                                                             +---------+---------------+---------+-----------+----------+--------------+     Summary: Right: There is no evidence of deep vein thrombosis in the lower extremity. A cystic structure is found in the popliteal fossa. Left: There is no evidence of deep vein thrombosis in the lower extremity. A cystic  structure is found in the popliteal fossa.  *See table(s) above for measurements and observations. Electronically signed by Deitra Mayo MD on 05/13/2019 at 4:28:55 PM.    Final    Telemetry    05/14/2019 NSR- Personally Reviewed  ECG    05/14/2019 SBE, HR 59 bpm, nonspecific TWI in leads III, V3, similar to prior tracing from 12/2018- Personally Reviewed  Cardiac Studies   Echocardiogram 05/13/2019:  1. Left ventricular ejection fraction, by visual estimation, is 60 to 65%. The left ventricle has normal function. Normal left ventricular size. There is severely increased left ventricular hypertrophy. 2. Left ventricular diastolic Doppler parameters are consistent with pseudonormalization pattern of LV diastolic filling. 3. Global right ventricle has normal systolic function.The right ventricular size is normal. 4. Left atrial size was normal. 5. Right atrial size was normal. 6. The mitral valve is normal in structure. No evidence of mitral valve regurgitation. No evidence of mitral stenosis. 7. The tricuspid valve is normal in structure. Tricuspid valve regurgitation is mild. 8. The aortic valve is tricuspid Aortic valve regurgitation is trivial by color flow Doppler. Mild aortic valve sclerosis without stenosis. 9. The pulmonic valve was normal in structure. Pulmonic valve regurgitation is not visualized by color flow Doppler. 10. Aortic dilatation noted. 11. There is mild dilatation of the ascending aorta measuring 38 mm. 12. Mildly elevated pulmonary artery systolic pressure. 13. The inferior vena cava is dilated in size with >50% respiratory variability, suggesting right atrial pressure of 8 mmHg. 14. Normal LV systolic function; grade 2 diastolic dysfunction; moderate to severe LVH; mildly dilated ascending aorta; trace AI; mild TR.   Cardiac Cath 12/16/18  Prox LAD lesion is 99% stenosed with 99% stenosed side branch in Ost 1st Diag.  A drug-eluting stent was  successfully placed using a STENT SYNERGY DES 2.75X20. Postdilated to 3.1 mm  Post intervention, there is a 0% residual stenosis.  Post intervention, the side branch was reduced to 90% residual stenosis with downstream 80% stenosis. Plan is to treat this medically.  ------------------------------------------------------  Prox RCA lesion is 60%  stenosed. Mid RCA lesion is 35% stenosed. Mid RCA to Dist RCA lesion is 55% stenosed. RPDA lesion is 65% stenosed.  Ost Ramus to Ramus lesion is 45% stenosed.  The left ventricular systolic function is normal. The left ventricular ejection fraction is 50-55% by visual estimate. -Apical hypokinesis  ==============================  SUMMARY  Severe CAD with severe 99% proximal-mid LAD at takeoff of 1st Diag & Septal branches associated with severe 99% ostial stenosis in both branches (1st Diag is a relatively small caliber vessel and diffusely diseased), diffuse moderate to severe 55 to 65% stenoses in proximal and mid RCA as well as RPDA.  Successful DES PCI of LAD lesion with synergy DES 2.7 mm x 20 mm (3.1 mm) preserving diagonal and septal flow.  Preserved LVEF with apparent apical hypokinesis consistent with echocardiogram findings  Normal LVEDP   Echo 12/14/18 1. The left ventricle has low normal systolic function, with an ejection fraction of 50-55%. The cavity size was normal. Left ventricular diastolic Doppler parameters are indeterminate. 2. LVEF is approximately 50 to 55% with hypokinesis of the dista/akiensis of the distal infeiror, inferolateral wallsl. 3. The right ventricle has normal systolic function. The cavity was normal. There is no increase in right ventricular wall thickness. 4. Trivial pericardial effusion is present. 5. The aortic valve is tricuspid. Mild thickening of the aortic valve. Aortic valve regurgitation is trivial by color flow Doppler.  Patient Profile     58 y.o. female with a hx of HTN, tobacco abuse,  DM, hyperlipidemia, recent NSTEMI, CAD s/p DES to the LAD who is being seen today for the evaluation of elevated troponin in the setting of sepsis at the request of Dr. Gwendlyn Deutscher.  Assessment & Plan    1. Chest pain, initially thought to be demand ischemia: -Presented with febrile illness and elevated lactic acid as well as acute kidney injury>>chest pain with elevated troponin at 95>165>330>501 -EKG with no acute changes  -Echocardiogram with preserved LV function, grade 2 diastolic dysfunction; moderate to severe LVH; mildly dilated ascending aorta; trace AI; mild TR -Plan as of 05/13/2019 was to trend troponin and managed conservatively given no exertional angina and other medical problems>>no cardiac evaluation unless symptoms arise or marked elevation in troponin -Unfortunately, HST elevated from 165, 330, 501>> continues to deny anginal symptoms, shortness of breath -Continue DAPT with ASA and Plavix in the setting of recent PCI 12/2018 -Plan to trend HsT until peak>demand ischemia as she has no anginal symptoms. Would consider rechecking CK, as this is likely contributing.   2. AKI: -Creatinine on presentation, 2.21 with a baseline that appears to be in the 0.6-0.9 range -Creatinine improved to day, down to 1.12 -IV fluid hydration on admission per primary team  -Daily BMET   3. HTN: -Stable, 128/81>135/85>135/79 -Continue metoprolol  -Hold home lisinopril secondary to #2  4. DM: -HbA1c, 8.4 -Metformin on hold  -SSI for glucose control while inpatient status   5. HLD: -LDL, 37 02/2019 -On home atorvastatin>>held secondary to elevated LFTs   Signed, Kathyrn Drown NP-C [facility] Pager: [phone] 05/14/2019, 7:03 AM     For questions or updates, please contact   Please consult [email or web address] for contact info under Cardiology/STEMI.  Patient seen and examined.  Agree with above documentation.  On exam, she is alert and oriented, lungs are clear to auscultation  bilaterally, regular rate and rhythm, no murmur, no lower extremity edema, no JVD.  She denies any further chest pain.  Telemetry personally reviewed, shows normal sinus rhythm with rates  50s to 60s.  Suspect troponin elevation due to demand ischemia in setting of sepsis and rhabdomyolysis.  Low suspicion for an acute coronary syndrome in setting of no wall motion abnormalities on TTE.  Renal function is significantly improved today with IV fluids.   Donato Heinz, MD

## 2019-05-14 NOTE — Progress Notes (Signed)
Pt c/o of Left and  Right knee pain, stated it started after the fall at home. Both knee are swollen and warm to touch. FMTS made aware.

## 2019-05-14 NOTE — ED Notes (Signed)
Pt taken to US

## 2019-05-14 NOTE — Progress Notes (Signed)
FPTS Interim Progress Note  S: RN called given concern with patient having swollen bilateral knees as well as pain and questionable heat in the knees.  Patient reports that she felt like her knees buckled when she fell yesterday but she did not fall on her knees.  States she is never had a problem with her knees hurting but that they have started hurting this afternoon.  She states again that she has not been doing any increased physical activity.  She denies any urinary symptoms.  She does report that she has had a slight cough today as well as some shortness of breath that is remaining, especially with exertion  O: BP 121/74   Pulse 72   Temp 98.5 F (36.9 C) (Oral)   Resp 15   Ht 5\' 3"  (1.6 m)   Wt 87.5 kg   SpO2 98%   BMI 34.17 kg/m   Bilateral knees with some small effusions noted bilaterally with no erythema, no heat and full range of motion.  Mildly tender on exam.  A/P: Bilateral knee pain Patient provided with Voltaren gel as well as tramadol 50 mg x 1.  No concern for compartment syndrome given the amount of tenderness on exam.  No signs of fracture on exam.  No concern for infection in joint given that there is no heat or erythema.  No concern for cellulitis given that it is bilaterally.  Likely that symptoms are related to patient's rhabdomyolysis and will treat pain and continue giving fluids.  Patient able to mobilize with physical therapy earlier today.  May benefit from standing x-ray in order to rule out OA as cause of pain if this continues after rhabdomyolysis is improved.   Isamar Nazir, [place], DO 05/14/2019, 7:39 PM PGY-3, [place] [facility] Service pager [phone]

## 2019-05-14 NOTE — ED Notes (Signed)
Pt ate lunch.

## 2019-05-14 NOTE — Progress Notes (Signed)
Pt arrived to unit on room air.

## 2019-05-15 ENCOUNTER — Inpatient Hospital Stay (HOSPITAL_COMMUNITY): Payer: Medicaid Other

## 2019-05-15 DIAGNOSIS — M25562 Pain in left knee: Secondary | ICD-10-CM

## 2019-05-15 DIAGNOSIS — R0600 Dyspnea, unspecified: Secondary | ICD-10-CM

## 2019-05-15 DIAGNOSIS — M25569 Pain in unspecified knee: Secondary | ICD-10-CM

## 2019-05-15 DIAGNOSIS — M6282 Rhabdomyolysis: Secondary | ICD-10-CM

## 2019-05-15 DIAGNOSIS — R0602 Shortness of breath: Secondary | ICD-10-CM

## 2019-05-15 DIAGNOSIS — M25561 Pain in right knee: Secondary | ICD-10-CM

## 2019-05-15 LAB — CBC WITH DIFFERENTIAL/PLATELET
Abs Immature Granulocytes: 0.02 10*3/uL (ref 0.00–0.07)
Basophils Absolute: 0 10*3/uL (ref 0.0–0.1)
Basophils Relative: 0 %
Eosinophils Absolute: 0.2 10*3/uL (ref 0.0–0.5)
Eosinophils Relative: 3 %
HCT: 40.7 % (ref 36.0–46.0)
Hemoglobin: 13.1 g/dL (ref 12.0–15.0)
Immature Granulocytes: 0 %
Lymphocytes Relative: 36 %
Lymphs Abs: 2.6 10*3/uL (ref 0.7–4.0)
MCH: 27.9 pg (ref 26.0–34.0)
MCHC: 32.2 g/dL (ref 30.0–36.0)
MCV: 86.6 fL (ref 80.0–100.0)
Monocytes Absolute: 0.7 10*3/uL (ref 0.1–1.0)
Monocytes Relative: 9 %
Neutro Abs: 3.7 10*3/uL (ref 1.7–7.7)
Neutrophils Relative %: 52 %
Platelets: 235 10*3/uL (ref 150–400)
RBC: 4.7 MIL/uL (ref 3.87–5.11)
RDW: 15.6 % — ABNORMAL HIGH (ref 11.5–15.5)
WBC: 7.2 10*3/uL (ref 4.0–10.5)
nRBC: 0 % (ref 0.0–0.2)

## 2019-05-15 LAB — BASIC METABOLIC PANEL
Anion gap: 7 (ref 5–15)
BUN: 5 mg/dL — ABNORMAL LOW (ref 6–20)
CO2: 20 mmol/L — ABNORMAL LOW (ref 22–32)
Calcium: 8 mg/dL — ABNORMAL LOW (ref 8.9–10.3)
Chloride: 113 mmol/L — ABNORMAL HIGH (ref 98–111)
Creatinine, Ser: 0.77 mg/dL (ref 0.44–1.00)
GFR calc Af Amer: >60 mL/min (ref >60)
GFR calc non Af Amer: >60 mL/min (ref >60)
Glucose, Bld: 210 mg/dL — ABNORMAL HIGH (ref 70–99)
Potassium: 3.9 mmol/L (ref 3.5–5.1)
Sodium: 140 mmol/L (ref 135–145)

## 2019-05-15 LAB — COMPREHENSIVE METABOLIC PANEL
ALT: 111 U/L — ABNORMAL HIGH (ref 0–44)
AST: 200 U/L — ABNORMAL HIGH (ref 15–41)
Albumin: 3.1 g/dL — ABNORMAL LOW (ref 3.5–5.0)
Alkaline Phosphatase: 53 U/L (ref 38–126)
Anion gap: 9 (ref 5–15)
BUN: 5 mg/dL — ABNORMAL LOW (ref 6–20)
CO2: 20 mmol/L — ABNORMAL LOW (ref 22–32)
Calcium: 8.1 mg/dL — ABNORMAL LOW (ref 8.9–10.3)
Chloride: 112 mmol/L — ABNORMAL HIGH (ref 98–111)
Creatinine, Ser: 0.83 mg/dL (ref 0.44–1.00)
GFR calc Af Amer: >60 mL/min (ref >60)
GFR calc non Af Amer: >60 mL/min (ref >60)
Glucose, Bld: 245 mg/dL — ABNORMAL HIGH (ref 70–99)
Potassium: 4 mmol/L (ref 3.5–5.1)
Sodium: 141 mmol/L (ref 135–145)
Total Bilirubin: 0.8 mg/dL (ref 0.3–1.2)
Total Protein: 5.8 g/dL — ABNORMAL LOW (ref 6.5–8.1)

## 2019-05-15 LAB — GLUCOSE, CAPILLARY
Glucose-Capillary: 76 mg/dL (ref 70–99)
Glucose-Capillary: 173 mg/dL — ABNORMAL HIGH (ref 70–99)
Glucose-Capillary: 223 mg/dL — ABNORMAL HIGH (ref 70–99)
Glucose-Capillary: 213 mg/dL — ABNORMAL HIGH (ref 70–99)
Glucose-Capillary: 165 mg/dL — ABNORMAL HIGH (ref 70–99)

## 2019-05-15 LAB — CK: Total CK: 22888 U/L — ABNORMAL HIGH (ref 38–234)

## 2019-05-15 MED ORDER — POTASSIUM CHLORIDE CRYS ER 20 MEQ PO TBCR
40.0000 meq | EXTENDED_RELEASE_TABLET | Freq: Two times a day (BID) | ORAL | Status: DC
  Administered 2019-05-15 – 2019-05-16 (×2): 40 meq via ORAL
  Filled 2019-05-15 (×2): qty 2

## 2019-05-15 MED ORDER — POTASSIUM CHLORIDE CRYS ER 20 MEQ PO TBCR
40.0000 meq | EXTENDED_RELEASE_TABLET | Freq: Once | ORAL | Status: AC
  Administered 2019-05-15: 40 meq via ORAL
  Filled 2019-05-15: qty 2

## 2019-05-15 MED ORDER — AZITHROMYCIN 250 MG PO TABS
500.0000 mg | ORAL_TABLET | Freq: Every day | ORAL | Status: DC
  Administered 2019-05-16: 500 mg via ORAL
  Filled 2019-05-15: qty 2

## 2019-05-15 MED ORDER — INFLUENZA VAC SPLIT QUAD 0.5 ML IM SUSY
0.5000 mL | PREFILLED_SYRINGE | INTRAMUSCULAR | Status: AC
  Administered 2019-05-16: 0.5 mL via INTRAMUSCULAR
  Filled 2019-05-15: qty 0.5

## 2019-05-15 NOTE — Progress Notes (Signed)
Pt RAC IV infiltrated- running NS- arm is slightly swollen- removed iv IV team replaced iv in LFA

## 2019-05-15 NOTE — Evaluation (Signed)
Occupational Therapy Evaluation Patient Details Name: Krista [name] MRN: [id number] DOB: [birth year] Today's Date: 05/15/2019    History of Present Illness 58 Y/O F with PMX of severe CAD s/p stent placement, NSTEMI, DM2, HTN, presented with chest pain and SoB after fall in which knees buckled. Admitted 05/13/19 with elevated troponins and AKI.    Clinical Impression   Pt admitted with the above diagnoses and presents with below problem list. Pt will benefit from continued acute OT to address the below listed deficits and maximize independence with basic ADLs prior to d/c to venue below. PTA pt was independent with ADLs, is caregiver for elderly parents. Pt currently set up to min guard with basic ADLs. Provided LB ADL AE kit as pt reports ongoing issues with accessing feet for LB ADLs due to B knee pain.     Follow Up Recommendations  Home health OT    Equipment Recommendations  3 in 1 bedside commode;Tub/shower bench    Recommendations for Other Services       Precautions / Restrictions Precautions Precautions: Fall Restrictions Weight Bearing Restrictions: No      Mobility Bed Mobility Overal bed mobility: Needs Assistance Bed Mobility: Supine to Sit;Sit to Supine     Supine to sit: Supervision Sit to supine: Supervision   General bed mobility comments: supervision for safety, increased time and effort with use of bedrail  Transfers Overall transfer level: Needs assistance Equipment used: Rolling walker (2 wheeled) Transfers: Sit to/from Stand Sit to Stand: Min guard         General transfer comment: min guard for safety, increased time and effort, incrased UE support in standing    Balance Overall balance assessment: Needs assistance Sitting-balance support: No upper extremity supported;Feet supported Sitting balance-Leahy Scale: Good     Standing balance support: Bilateral upper extremity supported;Single extremity supported Standing balance-Leahy  Scale: Poor Standing balance comment: requires UE/external support due to pain                            ADL either performed or assessed with clinical judgement   ADL Overall ADL's : Needs assistance/impaired Eating/Feeding: Set up;Sitting   Grooming: Supervision/safety;Standing;Min guard   Upper Body Bathing: Set up;Sitting   Lower Body Bathing: Min guard;Sit to/from stand;With adaptive equipment   Upper Body Dressing : Set up;Sitting   Lower Body Dressing: Min guard;With adaptive equipment;Sit to/from stand   Toilet Transfer: Min guard   Toileting- Water quality scientist and Hygiene: Min guard   Tub/ Shower Transfer: Min guard   Functional mobility during ADLs: Min guard;Rolling walker General ADL Comments: Provided LB ADL AE kit and instructed in use.      Vision         Perception     Praxis      Pertinent Vitals/Pain Pain Assessment: Faces Faces Pain Scale: Hurts even more Pain Location: bilateral knees Pain Descriptors / Indicators: Aching;Throbbing Pain Intervention(s): Limited activity within patient's tolerance;Monitored during session;Repositioned;Premedicated before session     Hand Dominance     Extremity/Trunk Assessment Upper Extremity Assessment Upper Extremity Assessment: Overall WFL for tasks assessed   Lower Extremity Assessment Lower Extremity Assessment: Defer to PT evaluation       Communication Communication Communication: No difficulties   Cognition Arousal/Alertness: Awake/alert Behavior During Therapy: WFL for tasks assessed/performed Overall Cognitive Status: Within Functional Limits for tasks assessed  General Comments       Exercises     Shoulder Instructions      Home Living Family/patient expects to be discharged to:: Private residence Living Arrangements: Parent Available Help at Discharge: Family;Available PRN/intermittently Type of Home:  House Home Access: Ramped entrance     Home Layout: One level     Bathroom Shower/Tub: Teacher, early years/pre: Standard Bathroom Accessibility: Yes   Home Equipment: None          Prior Functioning/Environment Level of Independence: Independent        Comments: takes care of her parents        OT Problem List: Impaired balance (sitting and/or standing);Decreased activity tolerance;Decreased knowledge of use of DME or AE;Decreased knowledge of precautions;Pain      OT Treatment/Interventions: Self-care/ADL training;Therapeutic exercise;Energy conservation;DME and/or AE instruction;Therapeutic activities;Patient/family education;Balance training    OT Goals(Current goals can be found in the care plan section) Acute Rehab OT Goals Patient Stated Goal: have less pain OT Goal Formulation: With patient Time For Goal Achievement: 05/29/19 Potential to Achieve Goals: Good ADL Goals Pt Will Perform Lower Body Bathing: with modified independence;sit to/from stand Pt Will Perform Lower Body Dressing: with modified independence;sit to/from stand Pt Will Transfer to Toilet: with modified independence;ambulating Pt Will Perform Toileting - Clothing Manipulation and hygiene: with modified independence;sit to/from stand Pt Will Perform Tub/Shower Transfer: Tub transfer;with modified independence;ambulating;tub bench;rolling walker  OT Frequency: Min 2X/week   Barriers to D/C:    pt is caregiver to elderly parents       Co-evaluation              AM-PAC OT "6 Clicks" Daily Activity     Outcome Measure Help from another person eating meals?: None Help from another person taking care of personal grooming?: None Help from another person toileting, which includes using toliet, bedpan, or urinal?: None Help from another person bathing (including washing, rinsing, drying)?: A Little Help from another person to put on and taking off regular upper body clothing?:  None Help from another person to put on and taking off regular lower body clothing?: A Little 6 Click Score: 22   End of Session Equipment Utilized During Treatment: Rolling walker  Activity Tolerance: Patient limited by pain;Patient tolerated treatment well Patient left: in bed;with call bell/phone within reach  OT Visit Diagnosis: Unsteadiness on feet (R26.81);Pain                Time: 2876-8115 OT Time Calculation (min): 17 min Charges:  OT General Charges $OT Visit: 1 Visit OT Evaluation $OT Eval Low Complexity: [place], OT Acute Rehabilitation Services Pager: [phone] Office: [phone]   Hortencia Pilar 05/15/2019, 11:15 AM

## 2019-05-15 NOTE — Progress Notes (Signed)
PT Cancellation Note  Patient Details Name: Krista [name] MRN: [id number] DOB: 1961/04/24   Cancelled Treatment:    Reason Eval/Treat Not Completed: Pain limiting ability to participate. Patient reports 9/10 pain in B knees. Does not want to do anything right now. Will follow up with tomorrow. Requested pain medicine, RN notified.    Valkyrie Guardiola 05/15/2019, 2:28 PM

## 2019-05-15 NOTE — Progress Notes (Addendum)
Family Medicine Teaching Service Daily Progress Note Intern Pager: [phone]  Patient name: Krista [name] Medical record number: [id number] Date of birth: June 03, 1961 Age: 58 y.o. Gender: female  Primary Care Provider: Mardi [name], [place] Consultants: Cardiology Code Status: Full Code   Assessment and Plan:  Consultants: Cardiology Code Status: Full  Pt Overview and Major Events to Date:  9/29: Admitted with sirs criteria 9/30: narrowed to CTX and azithro for ?CAP  Assessment and Plan: Krista [name] is a 58 y.o. female who presented with chest pain, shortness of breath and found to be septic with concern for community-acquired pneumonia as well as rhabdomyolysis following a fall.  Patient's past medical history significant for CAD, MI status post stent placed in her LAD in May 2020, hypertension, diabetes, hyperlipidemia and tobacco use disorder.  Fever of unknown origin meeting  SIRS criteria Patient afebrile overnight, WBC decreased from 11 to 7.2. L. Rhabdomyolysis may also cause fever, but patient also with respiratory symptoms initially. VQ scan negative for PE. Blood cultures neg for 48 hours, urine culture inconclusive given growth of multiple species. Patient noted to have decreased basilar lung sounds on exam without wheezing or crackles with oxygen saturations of 97% on RA, no longer requiring supplemental oxygen.  -CAP coverage with azithro (9/30-), day 2 of 5 -Discontinued ceftriaxone -continue mIVF at 277mL/hr  Rhabdomyolysis, improving CK elevated to 31,787, CK decreased to 22,000.  May be statin induced. No other symptoms concerning for viral illness (covid neg). Will begin to monitor and adjust fluids based on output.   -Continue IV fluids -Strict I's and O's -holding statin -continue to monitor for down trending CK  Bilateral Knee Pain:  Patient denies fall impacting her knees as she fell on her back a few days ago but reports severe pain in both  of her knees with the right being more painful than the left. Patient is noted to have some edema in the left knee and no edema in the right knee, no erythema in either joint, no warmth to touch. -Knee xray showing mild effusion with no dislocation nor fractures -continue to offer Voltaren gel  -will prescribe tramadol as needed  Transaminitis Likely due to rhabdomyolysis as above.  RUQ WNL. AST 278 now 200 and ALT now 111 from 89.  - Consider hepatitis panel outpatient if remains elevated after resolution of rhabdo.  - continue to monitor  Acute Hypoxic Respiratory Failure.  Resolved. Not on home oxygen.  Required 2L [place] on admission, but now on room air satting 98%. CXR showed no acute changes from last admission and VQ scan was negative for PE. Doppler LE U/S negative. Echocardiogram with EF 60 to 65%, severe LVH. - Cardiology consulted; recommended to continue metoprolol, aspirin, Plavix, and lisinopril upon resolution of AKI - Trend Troponins -CAP coverage with azithro (9/30-), day 2 of 5 -Discontinued ceftriaxone  CAD s/p stent placement - Cont Plavix and ASA per cardiology recommendations  AKI. Improving. Baseline appears to be around 0.7-0.9. Most likely prerenal secondary to dehydration as well as rhabdomyolysis. Cr currently 0.83 - Trend BMP / urinary output, strict I's and O's - Replace electrolytes as indicated - Avoid nephrotoxic agents, holding metformin and lisinopril   DM. Chronic. Stable with last A1c on 12/2018 at 7.2% - holding home metformin  - sSSI with no basal or meal time coverage correction - CBG checks in am with meal time checks  HTN. Chronic. Stable.  - cont metop; unlikely she has acute MI but would  be protective - holding lisinopril due to AKI  HLD. Chronic. Last lipid panel  - cont atorvastatin   Tobacco Use Disorder. Chronic. Smokes about 2 packs per week for over 30 years. - Offer cessation counseling - Offer nicotine replacement therapy  while in hospital  FEN/GI:  Heart healthy diet PPx:  Lovenox 40  Disposition: discharge pending resolution of rhabdomyolysis, will continue with IVFs @250mL /hr  Subjective:  Patient states that she feels somewhat better than when arriving but does continue to have severe pain in her knees and continues to be short of breath.  Objective: Temp:  [98.4 F (36.9 C)-98.7 F (37.1 C)] 98.4 F (36.9 C) (10/01 1319) Pulse Rate:  [60-72] 68 (10/01 1319) Resp:  [15-25] 16 (10/01 1319) BP: (121-161)/(74-110) 151/88 (10/01 1319) SpO2:  [95 %-97 %] 97 % (10/01 1319) Weight:  [89.5 kg] 89.5 kg (10/01 0255)  Physical Exam: General: Female appearing stated age sitting on edge of bed in no acute distress Cardiovascular: Regular rate and rhythm without murmurs or gallops or friction rubs Respiratory: Decreased lung sounds at bases, no crackles no wheezing, patient noted to take shallow breaths Abdomen: Soft, nontender with positive bowel sounds Extremities: bilateral knees without erythema, left joint with superior medial edema, joints are not warm to touch, patient able to exhibit normal range of motion bilaterally but with pain  Laboratory: Recent Labs  Lab 05/13/19 0911 05/14/19 0338 05/15/19 0849  WBC 13.4* 11.1* 7.2  HGB 15.2* 12.5 13.1  HCT 46.4* 38.9 40.7  PLT 343 201 235   Recent Labs  Lab 05/13/19 0911 05/14/19 0338 05/15/19 0849 05/15/19 1121  NA 144 145 141 140  K 3.9 2.8* 4.0 3.9  CL 109 117* 112* 113*  CO2 19* 20* 20* 20*  BUN 21* 14 5* 5*  CREATININE 2.21* 1.12* 0.83 0.77  CALCIUM 9.6 7.7* 8.1* 8.0*  PROT 8.0 5.5* 5.8*  --   BILITOT 1.0 1.2 0.8  --   ALKPHOS 67 46 53  --   ALT 67* 89* 111*  --   AST 100* 278* 200*  --   GLUCOSE 293* 83 245* 210*   Imaging/Diagnostic Tests:  Knee Xray: Moderate degenerative joint disease is noted medially.  Mild suprapatellar joint effusion. No fracture or dislocation is noted.  Stark Klein, MD 05/15/2019, 2:51  PM PGY-1, [place] Intern pager: [phone], text pages welcome

## 2019-05-15 NOTE — TOC Initial Note (Addendum)
Transition of Care [facility]) - Initial/Assessment Note    Patient Details  Name: Krista [name] MRN: [id number] Date of Birth: 1960/10/16  [facility] CM/SW Contact:    Carles Collet, RN Phone Number: 05/15/2019, 10:05 AM  Clinical Narrative:                Spoke w patient at bedside. She confirms she would need tub bench 3/1 and RW at DC, orders placed and reuested for [employer] to bring to room. Patient declined [facility] services stating that she has too many already coming and going at her house with her father's [facility] services. She verified that she would have transportation to OP PT.  Please place referral to OP PT closer to DC (incase she changes her mind and then wants HH, I did not put in the referral at this time.)  She states that she has transportation home    Expected Discharge Plan: Home/Self Care Barriers to Discharge: Continued Medical Work up   Patient Goals and CMS Choice Patient states their goals for this hospitalization and ongoing recovery are:: to go home      Expected Discharge Plan and Services Expected Discharge Plan: Home/Self Care   Discharge Planning Services: CM Consult                     DME Arranged: Walker rolling, Tub bench DME Agency: [employer] Date DME Agency Contacted: 05/15/19 Time DME Agency Contacted: 1004 Representative spoke with at DME Agency: zack HH Arranged: Patient Refused [place]          Prior Living Arrangements/Services   Lives with:: Parents                   Activities of Daily Living      Permission Sought/Granted                  Emotional Assessment              Admission diagnosis:  Precordial pain [R07.2] Dyspnea [R06.00] Transaminitis [R74.01] AKI (acute kidney injury) ([place]) [N17.9] Sepsis with acute renal failure without septic shock, due to unspecified organism, unspecified acute renal failure type ([place]) [A41.9, R65.20, N17.9] Patient Active Problem List   Diagnosis Date Noted  .  Demand ischemia ([place])   . Sepsis ([place]) 05/13/2019  . NSTEMI (non-ST elevated myocardial infarction) ([place]) 05/13/2019  . Coronary artery disease 02/12/2019  . Hyperlipidemia 02/12/2019  . Unstable angina ([place])   . Near syncope 12/14/2018  . Chest tightness 12/14/2018  . Prolonged Q-T interval on ECG   . Acute gastroenteritis 08/02/2012  . Dehydration 08/02/2012  . AKI (acute kidney injury) ([place]) 08/02/2012  . Hypokalemia 08/02/2012  . Diabetes mellitus ([place]) 08/02/2012  . HTN (hypertension) 08/02/2012  . Tobacco abuse 08/02/2012  . ALLERGIC RHINITIS WITH CONJUNCTIVITIS 10/21/2010  . ROTATOR CUFF SYNDROME, RIGHT 10/21/2010  . SEBACEOUS CYST, INFECTED 01/31/2010  . GUAIAC POSITIVE STOOL 10/05/2009  . TOBACCO ABUSE 09/21/2009  . RECTAL BLEEDING, HX OF 09/21/2009  . Nonspecific (abnormal) findings on radiological and other examination of body structure 05/19/2009  . RIGHT LOWER LUNG FIELD CRACKLES 05/19/2009  . Diabetes ([place]) 02/12/2009  . Pain in limb 02/12/2009  . DENTAL CARIES 04/21/2008  . HYPERTENSION 05/01/2007   PCP:  Mardi Mainland, FNP Pharmacy:   [facility] [place]), [place] - [address] [place] [address] Phone: [phone] Fax: [phone]     Social  Determinants of Health (SDOH) Interventions    Readmission Risk Interventions No flowsheet data found.

## 2019-05-15 NOTE — Progress Notes (Addendum)
Progress Note  Patient Name: Krista [name] Date of Encounter: 05/15/2019  Primary Cardiologist: Quay Burow, MD   Subjective   Patient is feeling better today. Weakness is slowly improving. She denies chest pain. She feels a a little short of breath on exertion. Now off O2. Knee pain is improved with medications.   Inpatient Medications    Scheduled Meds:  aspirin EC  81 mg Oral Daily   clopidogrel  75 mg Oral Daily   enoxaparin (LOVENOX) injection  40 mg Subcutaneous Q24H   insulin aspart  0-9 Units Subcutaneous TID WC   metoprolol tartrate  25 mg Oral BID   Continuous Infusions:  sodium chloride 250 mL/hr at 05/15/19 0437   azithromycin Stopped (05/14/19 1354)   cefTRIAXone (ROCEPHIN)  IV Stopped (05/14/19 1202)   PRN Meds: acetaminophen **OR** acetaminophen, diclofenac sodium, polyethylene glycol, senna   Vital Signs    Vitals:   05/14/19 1600 05/14/19 2104 05/15/19 0255 05/15/19 0420  BP: 121/74 (!) 141/81  (!) 147/94  Pulse:  72 60 63  Resp: 15 (!) 25 (!) 23 20  Temp:  98.7 F (37.1 C)  98.7 F (37.1 C)  TempSrc:  Oral  Oral  SpO2:  95% 96% 97%  Weight:   89.5 kg   Height:        Intake/Output Summary (Last 24 hours) at 05/15/2019 0733 Last data filed at 05/15/2019 0532 Gross per 24 hour  Intake 5857.61 ml  Output 300 ml  Net 5557.61 ml   Last 3 Weights 05/15/2019 05/14/2019 05/13/2019  Weight (lbs) 197 lb 4.8 oz 192 lb 14.4 oz 180 lb  Weight (kg) 89.495 kg 87.5 kg 81.647 kg      Telemetry    NSR, HR 60-70 with some lower rates to 47 bpm; no other arrhythmias noted - Personally Reviewed  ECG     No new- Personally Reviewed  Physical Exam   GEN: No acute distress.   Neck: No JVD Cardiac: RRR, no murmurs, rubs, or gallops.  Respiratory: diminished breath sounds bilaterally. GI: Soft, nontender, non-distended  MS: minimal bilateral knee edema; No deformity;  Neuro:  Nonfocal  Psych: Normal affect   Labs    High Sensitivity  Troponin:   Recent Labs  Lab 05/13/19 1700 05/13/19 1830 05/14/19 0338 05/14/19 0916 05/14/19 1123  TROPONINIHS 313* 330* 501* 361* 328*      Chemistry Recent Labs  Lab 05/13/19 0911 05/14/19 0338  NA 144 145  K 3.9 2.8*  CL 109 117*  CO2 19* 20*  GLUCOSE 293* 83  BUN 21* 14  CREATININE 2.21* 1.12*  CALCIUM 9.6 7.7*  PROT 8.0 5.5*  ALBUMIN 4.2 3.0*  AST 100* 278*  ALT 67* 89*  ALKPHOS 67 46  BILITOT 1.0 1.2  GFRNONAA 24* 55*  GFRAA 28* >60  ANIONGAP 16* 8     Hematology Recent Labs  Lab 05/13/19 0911 05/14/19 0338  WBC 13.4* 11.1*  RBC 5.38* 4.42  HGB 15.2* 12.5  HCT 46.4* 38.9  MCV 86.2 88.0  MCH 28.3 28.3  MCHC 32.8 32.1  RDW 15.3 15.6*  PLT 343 201    BNPNo results for input(s): BNP, PROBNP in the last 168 hours.   DDimer  Recent Labs  Lab 05/13/19 0911  DDIMER 2.78*     Radiology    Dg Chest 2 View  Result Date: 05/14/2019 CLINICAL DATA:  Shortness of breath.  Recent fall EXAM: CHEST - 2 VIEW COMPARISON:  May 13, 2019 FINDINGS: There  is atelectatic change in the right base. There is no frank edema or consolidation. Heart size and pulmonary vascularity normal. No adenopathy. There is aortic atherosclerosis. No bone lesions. IMPRESSION: Right base atelectasis. Lungs elsewhere clear. Cardiac silhouette within normal limits. Aortic Atherosclerosis (ICD10-I70.0). Electronically Signed   By: Lowella Grip III M.D.   On: 05/14/2019 08:01   Nm Pulmonary Perfusion  Result Date: 05/13/2019 CLINICAL DATA:  Positive D-dimer, intermittent probability for PE EXAM: NUCLEAR MEDICINE PERFUSION LUNG SCAN TECHNIQUE: Perfusion images were obtained in multiple projections after intravenous injection of radiopharmaceutical. Ventilation scans intentionally deferred if perfusion scan and chest x-ray adequate for interpretation during COVID 19 epidemic. RADIOPHARMACEUTICALS:  1.6 mCi Tc-6m MAA IV COMPARISON:  Correlation with chest radiograph dated 05/13/2019  FINDINGS: Normal perfusion. Ventilation was deferred. Corresponding chest radiograph demonstrates clear lungs. IMPRESSION: Negative for pulmonary embolism. Electronically Signed   By: Julian Hy M.D.   On: 05/13/2019 15:16   Dg Chest Port 1 View  Result Date: 05/13/2019 CLINICAL DATA:  Fever, shortness of breath EXAM: PORTABLE CHEST 1 VIEW COMPARISON:  12/13/2018 FINDINGS: Cardiomegaly. Unchanged elevation or eventration of the right hemidiaphragm with associated scarring or atelectasis. The visualized skeletal structures are unremarkable. IMPRESSION: Cardiomegaly without acute abnormality of the lungs in AP portable projection. Electronically Signed   By: Eddie Candle M.D.   On: 05/13/2019 09:36   Vas [place] Lower Extremity Venous (dvt) (only [facility])  Result Date: 05/13/2019  Lower Venous Study Indications: Edema.  Comparison Study: no prior Performing Technologist: June Leap RDMS, RVT  Examination Guidelines: A complete evaluation includes B-mode imaging, spectral Doppler, color Doppler, and power Doppler as needed of all accessible portions of each vessel. Bilateral testing is considered an integral part of a complete examination. Limited examinations for reoccurring indications may be performed as noted.  +---------+---------------+---------+-----------+----------+--------------+  RIGHT     Compressibility Phasicity Spontaneity Properties Thrombus Aging  +---------+---------------+---------+-----------+----------+--------------+  CFV       Full            Yes       Yes                                    +---------+---------------+---------+-----------+----------+--------------+  SFJ       Full                                                             +---------+---------------+---------+-----------+----------+--------------+  FV Prox   Full                                                             +---------+---------------+---------+-----------+----------+--------------+  FV Mid    Full                                                              +---------+---------------+---------+-----------+----------+--------------+  FV  Distal Full                                                             +---------+---------------+---------+-----------+----------+--------------+  PFV       Full                                                             +---------+---------------+---------+-----------+----------+--------------+  POP       Full            Yes       Yes                                    +---------+---------------+---------+-----------+----------+--------------+  PTV       Full                                                             +---------+---------------+---------+-----------+----------+--------------+  PERO      Full                                                             +---------+---------------+---------+-----------+----------+--------------+   +---------+---------------+---------+-----------+----------+--------------+  LEFT      Compressibility Phasicity Spontaneity Properties Thrombus Aging  +---------+---------------+---------+-----------+----------+--------------+  CFV       Full            Yes       Yes                                    +---------+---------------+---------+-----------+----------+--------------+  SFJ       Full                                                             +---------+---------------+---------+-----------+----------+--------------+  FV Prox   Full                                                             +---------+---------------+---------+-----------+----------+--------------+  FV Mid    Full                                                             +---------+---------------+---------+-----------+----------+--------------+  FV Distal Full                                                             +---------+---------------+---------+-----------+----------+--------------+  PFV       Full                                                              +---------+---------------+---------+-----------+----------+--------------+  POP       Full            Yes       Yes                                    +---------+---------------+---------+-----------+----------+--------------+  PTV       Full                                                             +---------+---------------+---------+-----------+----------+--------------+  PERO      Full                                                             +---------+---------------+---------+-----------+----------+--------------+     Summary: Right: There is no evidence of deep vein thrombosis in the lower extremity. A cystic structure is found in the popliteal fossa. Left: There is no evidence of deep vein thrombosis in the lower extremity. A cystic structure is found in the popliteal fossa.  *See table(s) above for measurements and observations. Electronically signed by Deitra Mayo MD on 05/13/2019 at 4:28:55 PM.    Final    US Abdomen Limited Ruq  Result Date: 05/14/2019 CLINICAL DATA:  Transaminitis EXAM: ULTRASOUND ABDOMEN LIMITED RIGHT UPPER QUADRANT COMPARISON:  None. FINDINGS: Gallbladder: No gallstones or wall thickening visualized. No sonographic Murphy sign noted by sonographer. Common bile duct: Diameter: Normal caliber, 5 mm Liver: No focal lesion identified. Within normal limits in parenchymal echogenicity. Portal vein is patent on color Doppler imaging with normal direction of blood flow towards the liver. Other: None. IMPRESSION: Unremarkable right upper quadrant ultrasound. Electronically Signed   By: Rolm Baptise M.D.   On: 05/14/2019 08:59    Cardiac Studies   Echocardiogram 05/13/2019:  1. Left ventricular ejection fraction, by visual estimation, is 60 to 65%. The left ventricle has normal function. Normal left ventricular size. There is severely increased left ventricular hypertrophy. 2. Left ventricular diastolic Doppler parameters are consistent with pseudonormalization  pattern of LV diastolic filling. 3. Global right ventricle has normal systolic function.The right ventricular size is normal. 4. Left atrial size was normal. 5. Right atrial size was normal. 6. The mitral valve is normal in structure. No evidence of mitral  valve regurgitation. No evidence of mitral stenosis. 7. The tricuspid valve is normal in structure. Tricuspid valve regurgitation is mild. 8. The aortic valve is tricuspid Aortic valve regurgitation is trivial by color flow Doppler. Mild aortic valve sclerosis without stenosis. 9. The pulmonic valve was normal in structure. Pulmonic valve regurgitation is not visualized by color flow Doppler. 10. Aortic dilatation noted. 11. There is mild dilatation of the ascending aorta measuring 38 mm. 12. Mildly elevated pulmonary artery systolic pressure. 13. The inferior vena cava is dilated in size with >50% respiratory variability, suggesting right atrial pressure of 8 mmHg. 14. Normal LV systolic function; grade 2 diastolic dysfunction; moderate to severe LVH; mildly dilated ascending aorta; trace AI; mild TR.   Cardiac Cath 12/16/18  Prox LAD lesion is 99% stenosed with 99% stenosed side branch in Ost 1st Diag.  A drug-eluting stent was successfully placed using a STENT SYNERGY DES [id number]. Postdilated to 3.1 mm  Post intervention, there is a 0% residual stenosis.  Post intervention, the side branch was reduced to 90% residual stenosis with downstream 80% stenosis. Plan is to treat this medically.  ------------------------------------------------------  Prox RCA lesion is 60% stenosed. Mid RCA lesion is 35% stenosed. Mid RCA to Dist RCA lesion is 55% stenosed. RPDA lesion is 65% stenosed.  Ost Ramus to Ramus lesion is 45% stenosed.  The left ventricular systolic function is normal. The left ventricular ejection fraction is 50-55% by visual estimate. -Apical hypokinesis  ==============================  SUMMARY  Severe CAD with  severe 99% proximal-mid LAD at takeoff of 1st Diag & Septal branches associated with severe 99% ostial stenosis in both branches (1st Diag is a relatively small caliber vessel and diffusely diseased), diffuse moderate to severe 55 to 65% stenoses in proximal and mid RCA as well as RPDA.  Successful DES PCI of LAD lesion with synergy DES 2.7 mm x 20 mm (3.1 mm) preserving diagonal and septal flow.  Preserved LVEF with apparent apical hypokinesis consistent with echocardiogram findings  Normal LVEDP   Echo 12/14/18 1. The left ventricle has low normal systolic function, with an ejection fraction of 50-55%. The cavity size was normal. Left ventricular diastolic Doppler parameters are indeterminate. 2. LVEF is approximately 50 to 55% with hypokinesis of the dista/akiensis of the distal infeiror, inferolateral wallsl. 3. The right ventricle has normal systolic function. The cavity was normal. There is no increase in right ventricular wall thickness. 4. Trivial pericardial effusion is present. 5. The aortic valve is tricuspid. Mild thickening of the aortic valve. Aortic valve regurgitation is trivial by color flow Doppler.  Patient Profile     58 y.o. female with a hx of HTN, tobacco abuse, DM, hyperlipidemia, recent NSTEMI, CAD s/p DES to the LAD who is being seen today for the evaluation of elevated troponin in the setting of sepsis and possible rhabdo.   Assessment & Plan    Chest pain/Elevated Troponin in the setting of elevated CK Presented with febrile illness and elevated lactic acid as well as AKI. Patient had transient chest pain - Hs troponin peaked at 502 and is now trending down.  95>165>330>501 > 361 > 328 - EKG with no acute changes - Echo showed preserved LV function, G2DD, moderate to severe LVH, mildly dilated ascending aorta, trace AI, mild TR - Patient had recent PCI in 12/2018 - Patient denies further anginal symptoms - Elevated CK likely contributing to elevated  troponin >> is now trending down - Continue ASA and plavix  Fever of uknown origin  Patient was febrile on admission with elevated lactic acid - Thought to be due to PNA - On abx per IM - WBC 13.4 >11.1 yesterday  AKI - 2.21 on admission - Baseline 0.7- 0.9 - lisinopril and metformin on hold - CK 39, 358 yesterday - Creatinine yesterday 1.12. AM labs pending - IVF per IM  Bilateral knee pain - Voltaren gel and tramadol per IM - PT working with patient >> weakness slowly improving - Patient is very deconditioned  HTN - Reasonable pressures - Continue metoprolol  - Lisinopril on hold for AKI, can restart with resolution of AKI/rhabdo  DM2 - A1C 8.4 - SSI per IM  HLD - LDL 37 02/13/19 - Statin on hold for rhabdo - can discontinue Statin on discharge  Hypokalemia - 2.8 today - will supplement and recheck later  [place] will sign off.   Medication Recommendations:  Hold statin on discharge.  Continue ASA, plavix, lisinopril, metoprolol. Other recommendations (labs, testing, etc):  None Follow up as an outpatient:  Will schedule cardiology follow-up   For questions or updates, please contact [place] Please consult [email or web address] for contact info under        Signed, Cadence Ninfa Meeker, PA-C  05/15/2019, 7:33 AM    Patient seen and examined.  Agree with above documentation.  Patient denies any further chest pain.  Does report some dyspnea.  On exam, patient is alert and oriented, regular rate and rhythm, no murmurs, lungs CTAB, no LE edema or JVD.  High-sensitivity troponin peaked at 501.  Suspect this is related to demand ischemia in setting of febrile illness and  rhabdomyolysis.  CK improving with IV fluids.  Agree with holding statin given rhabdo of unclear etiology.  No further cardiac work-up recommended.  Donato Heinz, MD

## 2019-05-16 ENCOUNTER — Other Ambulatory Visit: Payer: Self-pay

## 2019-05-16 LAB — COMPREHENSIVE METABOLIC PANEL
ALT: 96 U/L — ABNORMAL HIGH (ref 0–44)
AST: 143 U/L — ABNORMAL HIGH (ref 15–41)
Albumin: 3 g/dL — ABNORMAL LOW (ref 3.5–5.0)
Alkaline Phosphatase: 55 U/L (ref 38–126)
Anion gap: 7 (ref 5–15)
BUN: 5 mg/dL — ABNORMAL LOW (ref 6–20)
CO2: 25 mmol/L (ref 22–32)
Calcium: 8.4 mg/dL — ABNORMAL LOW (ref 8.9–10.3)
Chloride: 107 mmol/L (ref 98–111)
Creatinine, Ser: 0.68 mg/dL (ref 0.44–1.00)
GFR calc Af Amer: >60 mL/min (ref >60)
GFR calc non Af Amer: >60 mL/min (ref >60)
Glucose, Bld: 181 mg/dL — ABNORMAL HIGH (ref 70–99)
Potassium: 3.9 mmol/L (ref 3.5–5.1)
Sodium: 139 mmol/L (ref 135–145)
Total Bilirubin: 0.5 mg/dL (ref 0.3–1.2)
Total Protein: 6 g/dL — ABNORMAL LOW (ref 6.5–8.1)

## 2019-05-16 LAB — GLUCOSE, CAPILLARY
Glucose-Capillary: 175 mg/dL — ABNORMAL HIGH (ref 70–99)
Glucose-Capillary: 309 mg/dL — ABNORMAL HIGH (ref 70–99)
Glucose-Capillary: 107 mg/dL — ABNORMAL HIGH (ref 70–99)

## 2019-05-16 LAB — CK: Total CK: 9548 U/L — ABNORMAL HIGH (ref 38–234)

## 2019-05-16 MED ORDER — ASPIRIN 81 MG PO TBEC: 81.0000 mg | DELAYED_RELEASE_TABLET | Freq: Every day | ORAL | 0 refills | Status: DC

## 2019-05-16 NOTE — Progress Notes (Signed)
Per patient has been urinating about every 30 minutes through the night with a lot of urine output (patient took hat out of toilet).

## 2019-05-16 NOTE — TOC Progression Note (Signed)
Transition of Care [facility]) - Progression Note    Patient Details  Name: Krista [name] MRN: [id number] Date of Birth: 19-Jan-1961  [facility] CM/SW Contact  Graves-Bigelow, Ocie Cornfield, RN Phone Number: 05/16/2019, 2:50 PM  Clinical Narrative: Patient previously wanted to do outpatient PT and now she wants [place]. CM has sent message out to several  Agencies to see if can accept Medicaid. Awaiting call back.  Expected Discharge Plan: Home/Self Care Barriers to Discharge: Continued Medical Work up  Expected Discharge Plan and Services Expected Discharge Plan: Home/Self Care   Discharge Planning Services: CM Consult     Expected Discharge Date: 05/16/19               DME Arranged: 3-N-1 DME Agency: [employer] Date DME Agency Contacted: 05/15/19 Time DME Agency Contacted: 76 Representative spoke with at DME Agency: [facility] HH Arranged: Patient Refused [place]    Social Determinants of Health ([place]) Interventions    Readmission Risk Interventions Readmission Risk Prevention Plan 05/15/2019  Transportation Screening Complete  Home Care Screening Complete  Some recent data might be hidden

## 2019-05-16 NOTE — Progress Notes (Signed)
Physical Therapy Treatment Patient Details Name: Krista [name] MRN: [id number] DOB: 10/07/1960 Today's Date: 05/16/2019    History of Present Illness 58 Y/O F with PMX of severe CAD s/p stent placement, NSTEMI, DM2, HTN, presented with chest pain and SoB after fall in which knees buckled. Admitted 05/13/19 with elevated troponins and AKI.     PT Comments    Patient received in bed, reports pain has not improved, continues to report 9/10 pain in B knees. Patient is independent with bed mobility, transfers with min guard and ambulated 30 feet in room with RW. Distance and activity limited by reported pain. Patient had xray that shows edema in R knee, no fracture. Patient given ice after walking to assist with pain management. Her mobility does not reflect the amount of pain she is reporting. Patient will continue to benefit from skilled PT to improve functional independence and activity tolerance.        Follow Up Recommendations  Home health PT;Supervision - Intermittent     Equipment Recommendations  Rolling walker with 5" wheels    Recommendations for Other Services       Precautions / Restrictions Precautions Precautions: Fall Restrictions Weight Bearing Restrictions: No    Mobility  Bed Mobility Overal bed mobility: Modified Independent             General bed mobility comments: No assistance needed, patient getting up to bathroom independently  Transfers Overall transfer level: Modified independent Equipment used: Rolling walker (2 wheeled) Transfers: Sit to/from Stand Sit to Stand: Supervision         General transfer comment: do significant difficulty noted, but patient moaning  Ambulation/Gait Ambulation/Gait assistance: Min guard Gait Distance (Feet): 30 Feet Assistive device: Rolling walker (2 wheeled) Gait Pattern/deviations: Step-through pattern Gait velocity: decreased   General Gait Details: min guard for safety, decreased tolerance due to  reported knee pain   Stairs             Wheelchair Mobility    Modified Rankin (Stroke Patients Only)       Balance Overall balance assessment: Needs assistance Sitting-balance support: Feet supported Sitting balance-Leahy Scale: Normal     Standing balance support: Bilateral upper extremity supported;During functional activity Standing balance-Leahy Scale: Good Standing balance comment: requires UE/external support due to pain                             Cognition Arousal/Alertness: Awake/alert Behavior During Therapy: WFL for tasks assessed/performed Overall Cognitive Status: Within Functional Limits for tasks assessed                                        Exercises      General Comments        Pertinent Vitals/Pain Pain Assessment: 0-10 Pain Score: 9  Pain Location: bilateral knees R>L Pain Descriptors / Indicators: Aching;Sore;Discomfort Pain Intervention(s): Limited activity within patient's tolerance;Monitored during session;Ice applied    Home Living                      Prior Function            PT Goals (current goals can now be found in the care plan section) Acute Rehab PT Goals Patient Stated Goal: have less pain PT Goal Formulation: With patient Time For Goal Achievement: 05/28/19 Potential to Achieve  Goals: Good Progress towards PT goals: Progressing toward goals    Frequency    Min 3X/week      PT Plan Current plan remains appropriate    Co-evaluation              AM-PAC PT "6 Clicks" Mobility   Outcome Measure  Help needed turning from your back to your side while in a flat bed without using bedrails?: None Help needed moving from lying on your back to sitting on the side of a flat bed without using bedrails?: None Help needed moving to and from a bed to a chair (including a wheelchair)?: A Little Help needed standing up from a chair using your arms (e.g., wheelchair or bedside  chair)?: A Little Help needed to walk in hospital room?: A Little Help needed climbing 3-5 steps with a railing? : A Lot 6 Click Score: 19    End of Session Equipment Utilized During Treatment: Gait belt Activity Tolerance: Patient limited by pain Patient left: in bed;with call bell/phone within reach Nurse Communication: Mobility status PT Visit Diagnosis: Muscle weakness (generalized) (M62.81);Difficulty in walking, not elsewhere classified (R26.2);Pain;History of falling (Z91.81) Pain - Right/Left: (bilateral) Pain - part of body: Knee     Time: 1010-1020 PT Time Calculation (min) (ACUTE ONLY): 10 min  Charges:  $Gait Training: 8-22 mins                     Krista [name], PT, GCS 05/16/19,12:21 PM

## 2019-05-16 NOTE — TOC Transition Note (Signed)
Transition of Care [facility]) - CM/SW Discharge Note   Patient Details  Name: Krista [name] MRN: [id number] Date of Birth: 1961-01-23  [facility] CM/SW Contact:  Bethena Roys, RN Phone Number: 05/16/2019, 4:25 PM   Clinical Narrative:  CM made several calls to [facility]- no one was able to accept the patient due to [facility] and staffing. CM made patient aware and she is agreeable to outpatient therapy at [address]. Patient states she will have transportation. No further needs from CM at this time.   Patient Goals and CMS Choice Patient states their goals for this hospitalization and ongoing recovery are:: to go home     Discharge Plan and Services   Discharge Planning Services: CM Consult            DME Arranged: 3-N-1 DME Agency: [employer] Date DME Agency Contacted: 05/15/19 Time DME Agency Contacted: 61 Representative spoke with at DME Agency: [place]: Patient Refused [place]    Readmission Risk Interventions Readmission Risk Prevention Plan 05/15/2019  Transportation Screening Complete  Home Care Screening Complete  Some recent data might be hidden

## 2019-05-16 NOTE — Discharge Instructions (Signed)
You were admitted and treated for pneumonia and rhabdomyolysis. You were treated with antibiotics and IV fluids with improvement. You will get home health physical and occupational therapy on discharge. You were continued on your home medications but your atorvastatin was STOPPED due to damage to your muscles. This can likely be restarted at a later date.   Rhabdomyolysis Rhabdomyolysis is a condition that happens when muscle cells break down and release substances into the blood that can damage the kidneys. This condition happens because of damage to the muscles that move bones (skeletal muscle). When the skeletal muscles are damaged, substances inside the muscle cells go into the blood. One of these substances is a protein called myoglobin. Large amounts of myoglobin can cause kidney damage or kidney failure. Other substances that are released by muscle cells may upset the balance of the minerals (electrolytes) in your blood. This imbalance causes your blood to have too much acid (acidosis). What are the causes? This condition is caused by muscle damage. Muscle damage often happens because of:  Using your muscles too much.  An injury that crushes or squeezes a muscle too tightly.  Using illegal drugs, mainly cocaine.  Alcohol abuse. Other possible causes include:  Prescription medicines, such as those that: ? Lower cholesterol (statins). ? Treat ADHD (attention deficit hyperactivity disorder) or help with weight loss (amphetamines). ? Treat pain (opiates).  Infections.  Muscle diseases that are passed down from parent to child (inherited).  High fever.  Heatstroke.  Not having enough fluids in your body (dehydration).  Seizures.  Surgery. What increases the risk? This condition is more likely to develop in people who:  Have a family history of muscle disease.  Take part in extreme sports, such as running in marathons.  Have diabetes.  Are older.  Abuse drugs or  alcohol. What are the signs or symptoms? Symptoms of this condition vary. Some people have very few symptoms, and other people have many symptoms. The most common symptoms include:  Muscle pain and swelling.  Weak muscles.  Dark urine.  Feeling weak and tired. Other symptoms include:  Nausea and vomiting.  Fever.  Pain in the abdomen.  Pain in the joints. Symptoms of complications from this condition include:  Heart rhythm that is not normal (arrhythmia).  Seizures.  Not urinating enough because of kidney failure.  Very low blood pressure (shock). Signs of shock include dizziness, blurry vision, and clammy skin.  Bleeding that is hard to stop or control. How is this diagnosed? This condition is diagnosed based on your medical history, your symptoms, and a physical exam. Tests may also be done, including:  Blood tests.  Urine tests to check for myoglobin. You may also have other tests to check for causes of muscle damage and to check for complications. How is this treated? Treatment for this condition helps to:  Make sure you have enough fluids in your body.  Lower the acid levels in your blood to reverse acidosis.  Protect your kidneys. Treatment may include:  Fluids and medicines given through an IV tube that is inserted into one of your veins.  Medicines to lower acidosis or to bring back the balance of the minerals in your body.  Hemodialysis. This treatment uses an artificial kidney machine to filter your blood while you recover. You may have this if other treatments are not helping. Follow these instructions at home:   Take over-the-counter and prescription medicines only as told by your health care provider.  Rest at  home until your health care provider says that you can return to your normal activities.  Drink enough fluid to keep your urine clear or pale yellow.  Do not do activities that take a lot of effort (are strenuous). Ask your health care  provider what level of exercise is safe for you.  Do not abuse drugs or alcohol. If you are having problems with drug or alcohol use, ask your health care provider for help.  Keep all follow-up visits as told by your health care provider. This is important. Contact a health care provider if:  You start having symptoms of this condition after treatment. Get help right away if:  You have a seizure.  You bleed easily or cannot control bleeding.  You cannot urinate.  You have chest pain.  You have trouble breathing. This information is not intended to replace advice given to you by your health care provider. Make sure you discuss any questions you have with your health care provider. Document Released: 07/13/2004 Document Revised: 07/13/2017 Document Reviewed: 05/12/2016 Elsevier Patient Education  2020 [employer].

## 2019-05-18 LAB — CULTURE, BLOOD (ROUTINE X 2)
Culture: NO GROWTH
Culture: NO GROWTH
Special Requests: ADEQUATE

## 2019-05-26 ENCOUNTER — Ambulatory Visit: Payer: Medicaid Other | Attending: Family Medicine

## 2019-05-26 ENCOUNTER — Other Ambulatory Visit: Payer: Self-pay

## 2019-05-26 DIAGNOSIS — R262 Difficulty in walking, not elsewhere classified: Secondary | ICD-10-CM | POA: Diagnosis present

## 2019-05-26 DIAGNOSIS — M25511 Pain in right shoulder: Secondary | ICD-10-CM | POA: Diagnosis present

## 2019-05-26 DIAGNOSIS — R2681 Unsteadiness on feet: Secondary | ICD-10-CM | POA: Insufficient documentation

## 2019-05-26 DIAGNOSIS — M79605 Pain in left leg: Secondary | ICD-10-CM | POA: Diagnosis present

## 2019-05-26 DIAGNOSIS — G8929 Other chronic pain: Secondary | ICD-10-CM | POA: Diagnosis present

## 2019-05-26 DIAGNOSIS — M25621 Stiffness of right elbow, not elsewhere classified: Secondary | ICD-10-CM | POA: Insufficient documentation

## 2019-05-26 DIAGNOSIS — M79602 Pain in left arm: Secondary | ICD-10-CM | POA: Diagnosis present

## 2019-05-26 DIAGNOSIS — M79604 Pain in right leg: Secondary | ICD-10-CM | POA: Insufficient documentation

## 2019-05-26 DIAGNOSIS — M6281 Muscle weakness (generalized): Secondary | ICD-10-CM | POA: Diagnosis present

## 2019-05-26 DIAGNOSIS — M25611 Stiffness of right shoulder, not elsewhere classified: Secondary | ICD-10-CM | POA: Insufficient documentation

## 2019-05-26 NOTE — Patient Instructions (Signed)
Access Code: [email or web address]  URL: [email or web address]  Date: 05/26/2019  Prepared by: Cherly Anderson   Exercises Proper Sit to Stand Technique - 5 reps - 1 sets - 2x daily - 5x weekly

## 2019-05-26 NOTE — Therapy (Signed)
[facility] [address] [place], [place], [address] Phone: [phone]   Fax:  [phone]  Physical Therapy Evaluation  Patient Details  Name: Krista [name] MRN: [id number] Date of Birth: 1961/06/10 Referring Provider (PT): Willey Blade   Encounter Date: 05/26/2019  PT End of Session - 05/26/19 1317    Visit Number  1    Number of Visits  12    Date for PT Re-Evaluation  07/25/19    Authorization Type  Medicaid    PT Start Time  1230    PT Stop Time  1314    PT Time Calculation (min)  44 min    Activity Tolerance  Patient tolerated treatment well    Behavior During Therapy  [facility] for tasks assessed/performed       Past Medical History:  Diagnosis Date  . Arthritis   . CAD (coronary artery disease)   . Depression   . Diabetes mellitus   . Hypertension   . NSTEMI (non-ST elevated myocardial infarction) [facility])     Past Surgical History:  Procedure Laterality Date  . ANKLE SURGERY     left ankle - was broken  . CORONARY STENT INTERVENTION N/A 12/16/2018   Procedure: CORONARY STENT INTERVENTION;  Surgeon: Leonie Man, MD;  Location: [facility];  Service: Cardiovascular;  Laterality: N/A;  LAD  . LEFT HEART CATH AND CORONARY ANGIOGRAPHY N/A 12/16/2018   Procedure: LEFT HEART CATH AND CORONARY ANGIOGRAPHY;  Surgeon: Leonie Man, MD;  Location: [facility];  Service: Cardiovascular;  Laterality: N/A;    There were no vitals filed for this visit.   Subjective Assessment - 05/26/19 1232    Subjective  Pt was hospitalized after fall due to chest pain. Diagnosed with rhabdomyolysis due to statin therapy she had been placed on after MI in May. Pt reports she had been limited with activities mostly due to knee trouble. After MI in May she has been limited with SOB with activities. Pt now feeling weaker since recent issue. Mother is helping with hair, bathing. Pt using FWW and only walking room  to room in home.    Pertinent History  58 y/o female hospitalized 9/29 to 10/2 due to rhabdomyolysis. Presented with chest pain after fall.PMH: MI, CAD s/p stent placement, AKI, DM, HTN, hyperlipidemia    Patient Stated Goals  Pt would like to get better to be able to move easier with less pain and more energy.    Currently in Pain?  Yes    Pain Score  0-No pain   8/10 pain with walking   Pain Location  Knee    Pain Orientation  Right;Left    Pain Descriptors / Indicators  Aching    Pain Type  Acute pain   had some soreness prior to recent issue but much worse now.   Pain Onset  1 to 4 weeks ago    Pain Frequency  Intermittent         [facility] PT Assessment - 05/26/19 1238      Assessment   Medical Diagnosis  rhabdomyolysis    Referring Provider (PT)  Willey Blade    Hand Dominance  Right    Prior Therapy  acute care therapy      Balance Screen   Has the patient fallen in the past 6 months  Yes    How many times?  1    Has the patient had a decrease in activity level  because of a fear of falling?   No    Is the patient reluctant to leave their home because of a fear of falling?   No      Home Social worker  Private residence    Living Arrangements  Parent    Available Help at Discharge  Family    Type of [place]  One level    [place] - 2 wheels;Bedside commode;Grab bars - toilet;Grab bars - tub/shower;Tub bench;Shower seat;Hand held Scientist, research (life sciences), sock aide, back scrubber   Additional Comments  lives with elderly parents. Father is bed ridden. 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 Was 2 years since able to work    Leisure   puzzles, gamble      Cognition   Overall Cognitive Status  --   reports sometimes forgets to take meds, has pill box     Observation/Other Assessments   Observations  skin intact      Sensation   Light Touch  Appears Intact      ROM / Strength   AROM / PROM / Strength  Strength      Strength   Overall Strength Comments  Pt catch and release with strength measures on right. Pain with resistance in joints    Strength Assessment Site  Shoulder;Elbow;Hip;Knee;Ankle    Right/Left Shoulder  Right;Left    Right Shoulder Flexion  2/5   reports history of right shoulder limitations   Left Shoulder Flexion  3+/5    Right/Left Elbow  Right;Left    Right Elbow Flexion  3+/5    Right Elbow Extension  3+/5    Left Elbow Flexion  4/5    Left Elbow Extension  4/5    Right/Left Hip  Right;Left    Right Hip Flexion  2+/5    Left Hip Flexion  3+/5    Right/Left Knee  Right;Left    Right Knee Flexion  3+/5    Right Knee Extension  3+/5    Left Knee Flexion  3+/5    Left Knee Extension  4/5    Right/Left Ankle  Right;Left    Right Ankle Dorsiflexion  3+/5    Left Ankle Dorsiflexion  4/5      Transfers   Transfers  Sit to Stand;Stand to Sit    Sit to Stand  5: Supervision    Sit to Stand Details  Verbal cues for technique    Stand to Sit  5: Supervision    Stand to Sit Details (indicate cue type and reason)  Tactile cues for placement    Comments  30 sec sit to stand=2 from edge of mat with UE support      Ambulation/Gait   Ambulation/Gait  Yes    Ambulation/Gait Assistance  5: Supervision    Ambulation Distance (Feet)  75 Feet    Assistive device  Rolling walker    Gait Pattern  Step-through pattern;Decreased step length - right;Decreased step length - left;Trunk flexed    Gait velocity  0.88m/s      Standardized Balance Assessment   Standardized Balance Assessment  Timed  Up and Go Test      Timed Up and Go Test   TUG  Normal TUG    Normal TUG (seconds)  28      High Level  Balance   High Level Balance Comments  Pt stood without UE support x 30 sec eyes open and x 30 sec eyes closed with slight increased sway but shaky                Objective measurements completed on examination: See above findings.              PT Education - 05/26/19 1350    Education Details  Pt educated on PT plan of care. Issued sit to stand x 5 to perform 2x/day for HEP. Instructed to walk extra lap in home after using bathroom during the day to try to increase gait.    Person(s) Educated  Patient    Methods  Explanation;Handout    Comprehension  Verbalized understanding       PT Short Term Goals - 05/26/19 1358      PT SHORT TERM GOAL #1   Title  Pt will be independent with HEP to continue to work on strength, balance and functional mobility gains at home.    Baseline  Currently does not have home program and limited mobility at home.    Time  4    Period  Weeks    Status  New    Target Date  06/25/19      PT SHORT TERM GOAL #2   Title  Pt will increase 30 sec sit to stand from 2 reps to 4 or more for improved functional strength and mobility.    Baseline  2 reps on 30 sec sit to stand test on 05/26/19    Time  4    Period  Weeks    Status  New    Target Date  06/25/19      PT SHORT TERM GOAL #3   Title  Pt will decrease TUG from 28 sec to <22 sec for improved balance and decreased fall risk.    Baseline  28 sec on TUG on 05/26/19    Time  4    Period  Weeks    Status  New    Target Date  06/25/19      PT SHORT TERM GOAL #4   Title  Pt will ambulate >250' with FWW mod I for improved household and short community distances.    Baseline  42' with [place] supervision with increased fatigue    Time  4    Period  Weeks    Status  New    Target Date  06/25/19        PT Long Term Goals - 05/26/19 1402      PT LONG TERM GOAL #1   Title  Pt will increase 30 sec sit to stand to 6 or more for further improvement in functional strength and mobility.     Baseline  2 on 05/26/19    Time  8    Period  Weeks    Status  New    Target Date  07/25/19      PT LONG TERM GOAL #2   Title  Pt will increase gait speed from 0.101m/s to >0.6 m/s for improved gait safety in house and community.    Baseline  gait speed=0.6m/s on 05/26/19    Time  8    Period  Weeks  Status  New    Target Date  07/25/19      PT LONG TERM GOAL #3   Title  Pt will decrease TUG to <18 sec for improved balance and decreased fall risk.    Baseline  28 sec on 05/26/19    Time  8    Period  Weeks    Status  New    Target Date  07/25/19      PT LONG TERM GOAL #4   Title  Pt will ambulate >400' on varied surfaces with SPC  mod I for improved gait safety.    Baseline  2' with FWW supervision on level surfaces on 05/26/19    Time  8    Period  Weeks    Status  New    Target Date  07/25/19      PT LONG TERM GOAL #5   Title  Pt will report <6/10 pain in knees with functional mobility in home for improved function.    Baseline  8/10 pain in knees with movement on 05/26/19    Time  8    Period  Weeks    Status  New    Target Date  07/25/19             Plan - 05/26/19 1352    Clinical Impression Statement  Pt presents to PT with reports of increased weakness, SOB and pain following hospitalization after fall from chest pain with rhabdomyolysis due to statin therapy. Pt with decreased strength throughout right more so than left. Pt also has increased joint pain throughout. Pt ambulating at slow gait speed indicating decreased safety for community ambulator at 0.62m/s. Pt fall risk based on TUG of 28 sec. Pt will benefit from skilled PT to address strength, balance and functional mobility deficits to improve independence.    Personal Factors and Comorbidities  Comorbidity 3+    Examination-Activity Limitations  Locomotion Level;Transfers;Squat;Stairs;Stand    Examination-Participation Restrictions  Community Activity;Driving;Shop;Meal Prep    Stability/Clinical  Decision Making  Evolving/Moderate complexity    Clinical Decision Making  Moderate    Rehab Potential  Good    PT Frequency  1x / week   followed by 2x/week for 4 weeks   PT Duration  3 weeks   followed by 2x/week for 4 weeks   PT Treatment/Interventions  ADLs/Self Care Home Management;Aquatic Therapy;DME Instruction;Gait training;Stair training;Functional mobility training;Therapeutic activities;Therapeutic exercise;Balance training;Patient/family education;Neuromuscular re-education;Energy conservation;Vestibular;Passive range of motion    PT Next Visit Plan  Next visit gait training, transfers seeing how sit to stand exercise is going. Progress strengthening HEP.    Consulted and Agree with Plan of Care  Patient       Patient will benefit from skilled therapeutic intervention in order to improve the following deficits and impairments:  Abnormal gait, Cardiopulmonary status limiting activity, Decreased activity tolerance, Decreased balance, Decreased strength, Decreased mobility, Decreased knowledge of use of DME, Impaired flexibility, Decreased endurance, Difficulty walking, Postural dysfunction, Decreased range of motion  Visit Diagnosis: Difficulty in walking, not elsewhere classified  Muscle weakness (generalized)  Pain in both lower extremities     Problem List Patient Active Problem List   Diagnosis Date Noted  . Dyspnea   . Knee pain   . Rhabdomyolysis due to statin therapy   . Demand ischemia ([place])   . Sepsis ([place]) 05/13/2019  . NSTEMI (non-ST elevated myocardial infarction) ([place]) 05/13/2019  . Coronary artery disease 02/12/2019  . Hyperlipidemia 02/12/2019  . Unstable angina ([place])   .  Near syncope 12/14/2018  . Chest tightness 12/14/2018  . Prolonged Q-T interval on ECG   . Acute gastroenteritis 08/02/2012  . Dehydration 08/02/2012  . AKI (acute kidney injury) ([place]) 08/02/2012  . Hypokalemia 08/02/2012  . Diabetes mellitus ([place]) 08/02/2012  . HTN  (hypertension) 08/02/2012  . Tobacco abuse 08/02/2012  . ALLERGIC RHINITIS WITH CONJUNCTIVITIS 10/21/2010  . ROTATOR CUFF SYNDROME, RIGHT 10/21/2010  . SEBACEOUS CYST, INFECTED 01/31/2010  . GUAIAC POSITIVE STOOL 10/05/2009  . TOBACCO ABUSE 09/21/2009  . RECTAL BLEEDING, HX OF 09/21/2009  . Nonspecific (abnormal) findings on radiological and other examination of body structure 05/19/2009  . RIGHT LOWER LUNG FIELD CRACKLES 05/19/2009  . Diabetes ([place]) 02/12/2009  . Pain in limb 02/12/2009  . DENTAL CARIES 04/21/2008  . HYPERTENSION 05/01/2007    Electa Sniff, PT, DPT, NCS 05/26/2019, 2:09 PM  [facility] [address] [place], [place], [address] Phone: [phone]   Fax:  [phone]  Name: Krista [name] MRN: [id number] Date of Birth: 1961/08/14

## 2019-06-02 ENCOUNTER — Other Ambulatory Visit: Payer: Self-pay

## 2019-06-02 ENCOUNTER — Encounter: Payer: Self-pay | Admitting: Occupational Therapy

## 2019-06-02 ENCOUNTER — Ambulatory Visit: Payer: Medicaid Other | Admitting: Occupational Therapy

## 2019-06-02 DIAGNOSIS — G8929 Other chronic pain: Secondary | ICD-10-CM

## 2019-06-02 DIAGNOSIS — M25611 Stiffness of right shoulder, not elsewhere classified: Secondary | ICD-10-CM

## 2019-06-02 DIAGNOSIS — M6281 Muscle weakness (generalized): Secondary | ICD-10-CM

## 2019-06-02 DIAGNOSIS — R262 Difficulty in walking, not elsewhere classified: Secondary | ICD-10-CM | POA: Diagnosis not present

## 2019-06-02 DIAGNOSIS — M25621 Stiffness of right elbow, not elsewhere classified: Secondary | ICD-10-CM

## 2019-06-02 DIAGNOSIS — R2681 Unsteadiness on feet: Secondary | ICD-10-CM

## 2019-06-02 DIAGNOSIS — M79602 Pain in left arm: Secondary | ICD-10-CM

## 2019-06-02 NOTE — Therapy (Signed)
[facility] [address] [place], [place], [address] Phone: [phone]   Fax:  [phone]  Occupational Therapy Evaluation  Patient Details  Name: Krista [name] MRN: [id number] Date of Birth: [birth year] Referring Provider (OT): Willey Blade (FNP)   Encounter Date: 06/02/2019  OT End of Session - 06/02/19 1735    Visit Number  1    Number of Visits  9    Date for OT Re-Evaluation  08/14/19    Authorization Type  Medicaid    OT Start Time  [id number]    OT Stop Time  1505    OT Time Calculation (min)  50 min    Activity Tolerance  Patient limited by lethargy       Past Medical History:  Diagnosis Date  . Arthritis   . CAD (coronary artery disease)   . Depression   . Diabetes mellitus   . Hypertension   . NSTEMI (non-ST elevated myocardial infarction) [facility])     Past Surgical History:  Procedure Laterality Date  . ANKLE SURGERY     left ankle - was broken  . CORONARY STENT INTERVENTION N/A 12/16/2018   Procedure: CORONARY STENT INTERVENTION;  Surgeon: Leonie Man, MD;  Location: [facility];  Service: Cardiovascular;  Laterality: N/A;  LAD  . LEFT HEART CATH AND CORONARY ANGIOGRAPHY N/A 12/16/2018   Procedure: LEFT HEART CATH AND CORONARY ANGIOGRAPHY;  Surgeon: Leonie Man, MD;  Location: [facility];  Service: Cardiovascular;  Laterality: N/A;    There were no vitals filed for this visit.  Subjective Assessment - 06/02/19 1623    Subjective   Today is not a good day- I am short of breath and tired    Currently in Pain?  Yes    Pain Score  7     Pain Location  Arm    Pain Orientation  Right    Pain Descriptors / Indicators  Aching    Pain Type  Chronic pain    Pain Onset  More than a month ago    Pain Frequency  Constant    Pain Relieving Factors  tylenol        [facility] OT Assessment - 06/02/19 0001      Assessment   Medical Diagnosis  rhabdomyolysis    Referring Provider  (OT)  Willey Blade (FNP)    Onset Date/Surgical Date  05/13/19    Hand Dominance  Right    Prior Therapy  acute care      Precautions   Precautions  Fall    Precaution Comments  do not lift more than 5 lbs per patient      Balance Screen   Has the patient fallen in the past 6 months  Yes      Prior Function   Level of Independence  Independent with basic ADLs;Independent with homemaking with ambulation    Vocation  Full time employment   until 2 years ago   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 Was 2 years since able to work    Leisure  watch tv, puzzle, sit outside      ADL   Eating/Feeding  Independent    Grooming  Minimal assistance    Upper Body Bathing  Modified independent    Lower Body Bathing  Modified independent    Upper Body Dressing  Increased time    Lower Body Dressing  Increased time;Minimal assistance  Tax adviser  Increased time;Use of adaptive device    Information systems manager bench;Grab bars    Equipment Used  Reacher   sock aid, shoe horn     IADL   Prior Level of Function Shopping  Independent    Shopping  Completely unable to shop    Prior Level of Function Light Housekeeping  Independent    Light Housekeeping  Needs help with all home maintenance tasks    Prior Level of Function Meal Prep  Independent    Meal Prep  Needs to have meals prepared and served    Prior Level of Function Sales executive  Relies on family or friends for transportation    Prior Level of Function Medication Managment  Independent    Medication Management  Is responsible for taking medication in correct dosages at correct time    Prior Level of Function Loss adjuster, chartered financial matters independently (budgets, writes  checks, pays rent, bills goes to bank), collects and keeps track of income      Written Expression   Dominant Hand  Right    Handwriting  100% legible;Increased time      Vision - History   Baseline Vision  Wears glasses all the time   bifocal   Patient Visual Report  --   not as crisp - due for eye exam     Vision Assessment   Eye Alignment  Within Functional Limits      Activity Tolerance   Activity Tolerance  Tolerates 10-20 min activity with multiple rests    Activity Tolerance Comments  significantly limits particiipation in ADL/IADL      Cognition   Overall Cognitive Status  Within Functional Limits for tasks assessed      Posture/Postural Control   Posture/Postural Control  Postural limitations    Postural Limitations  Rounded Shoulders;Posterior pelvic tilt      Sensation   Light Touch  Appears Intact      Coordination   Gross Motor Movements are Fluid and Coordinated  No    Fine Motor Movements are Fluid and Coordinated  No    9 Hole Peg Test  Right;Left    Right 9 Hole Peg Test  27.56    Left 9 Hole Peg Test  27.25      ROM / Strength   AROM / PROM / Strength  AROM;Strength      AROM   Overall AROM   Deficits    AROM Assessment Site  Shoulder;Elbow    Right/Left Shoulder  Right;Left    Right Shoulder Flexion  99 Degrees    Right Shoulder ABduction  83 Degrees    Right Shoulder Internal Rotation  40 Degrees    Right Shoulder External Rotation  40 Degrees    Left Shoulder Flexion  143 Degrees    Left Shoulder ABduction  120 Degrees    Left Shoulder Internal Rotation  80 Degrees    Left Shoulder External Rotation  70 Degrees      Strength   Strength Assessment Site  Shoulder    Right Shoulder Flexion  2/5   reports history of right shoulder limitations   Left Shoulder Flexion  3+/5    Right Elbow Flexion  3+/5    Right Elbow Extension  3+/5  Left Elbow Flexion  4/5    Left Elbow Extension  4/5      Hand Function   Right Hand Gross Grasp   Impaired    Right Hand Grip (lbs)  25    Right Hand Lateral Pinch  10 lbs    Left Hand Gross Grasp  Impaired    Left Hand Grip (lbs)  15    Left Hand Lateral Pinch  9 lbs                      OT Education - 06/02/19 1734    Education Details  Reviewed evaluation findings and discussed potential goals and plan of care    Person(s) Educated  Patient    Methods  Explanation    Comprehension  Verbalized understanding       OT Short Term Goals - 06/02/19 1743      OT SHORT TERM GOAL #1   Title  Patient will complete HEP designed to improve BUE AROM without assistance    Baseline  No current exercise program    Time  4    Period  Weeks    Status  New    Target Date  07/09/19      OT SHORT TERM GOAL #2   Title  Patient will ambulate in kitchen and prepare a simple cold snack and beverage  for herself without assistance    Baseline  Patient is currently reliant on her mother for all meals    Time  4    Period  Weeks    Status  New      OT SHORT TERM GOAL #3   Title  Patient will stand to complete simple housekeeping task for greater than 10 minutes without rest    Baseline  Unable to stand more than 10 min    Time  4    Period  Weeks    Status  New      OT SHORT TERM GOAL #4   Title  Patient will report pain no more than 4/10 in right shoulder with passive range of motion exercise    Baseline  7/10 at rest    Time  4    Period  Weeks    Status  New        OT Long Term Goals - 06/02/19 1747      OT LONG TERM GOAL #1   Title  Patient will complete a home activities program designed to improve activity tolerance with daily life skills    Baseline  Patient reliant on elderly mother for ADL/IADL    Time  8    Period  Weeks    Status  New    Target Date  08/14/19      OT LONG TERM GOAL #2   Title  Patient will complete a HEP designed to improve grip strength and finger range of motion    Baseline  Not currently exercising UE's    Time  8    Period   Weeks    Status  New      OT LONG TERM GOAL #3   Title  Patient will complete all bathing, dressing, grooming at modified independent level    Baseline  Min assist    Time  8    Period  Weeks    Status  New      OT LONG TERM GOAL #4   Title  Patient will complete simple warm meal  prep at least 2 items  for she and her parents without assistance    Baseline  Patient unable to cook currently    Time  8    Period  Weeks    Status  New      OT LONG TERM GOAL #5   Title  Patient will complete at least 15-20 min of simple housekeeping tasks without rest break    Baseline  Is not currently completing, reliant on mother    Time  8    Period  Weeks    Status  New      Long Term Additional Goals   Additional Long Term Goals  Yes      OT LONG TERM GOAL #6   Title  Patient will demonstrate a 5 lb increase in grip strength in BUE  to aide with opening or carrying items    Baseline  R- 25LB, L- 15 LB    Time  8    Period  Weeks    Status  New            Plan - 06/02/19 1736    Clinical Impression Statement  Patient is a 58 year old woman with recent hospitalization following fall with prolonged time down and rhabdomyolysis secondary to statin treatment change.  Patient was not able to work for the past two years, but lived with and assisted her elderly parents.  SHe was independent with ADL/IADL. She currently requires assistance for ADL due to decreased activity tolerance, pain in BUE's, BLE's, shortness of breath, decreased range of motion throughout UE's R > L, and significant weakness.    OT Occupational Profile and History  Detailed Assessment- Review of Records and additional review of physical, cognitive, psychosocial history related to current functional performance    Occupational performance deficits (Please refer to evaluation for details):  ADL's;IADL's;Rest and Sleep;Work;Leisure    Body Structure / Function / Physical Skills  ADL;Coordination;Endurance;GMC;UE functional  use;Balance;Decreased knowledge of precautions;Fascial restriction;Pain;IADL;Flexibility;Decreased knowledge of use of DME;Body mechanics;Cardiopulmonary status limiting activity;Dexterity;FMC;Strength;Tone;ROM;Mobility    Rehab Potential  Good    Clinical Decision Making  Several treatment options, min-mod task modification necessary    Comorbidities Affecting Occupational Performance:  May have comorbidities impacting occupational performance   Heart attack in May 2020, arthritis   OT Frequency  1x / week    OT Duration  8 weeks    OT Treatment/Interventions  Self-care/ADL training;Iontophoresis;Therapeutic exercise;Electrical Stimulation;Patient/family education;Splinting;Neuromuscular education;Moist Heat;Aquatic Therapy;Fluidtherapy;Functional Mobility Training;Energy conservation;Therapeutic activities;Balance training;Manual Therapy;DME and/or AE instruction;Ultrasound;Cryotherapy    Plan  Begin HEP shoulders supine and elbows, also needs hand HEP    Consulted and Agree with Plan of Care  Patient       Patient will benefit from skilled therapeutic intervention in order to improve the following deficits and impairments:   Body Structure / Function / Physical Skills: ADL, Coordination, Endurance, GMC, UE functional use, Balance, Decreased knowledge of precautions, Fascial restriction, Pain, IADL, Flexibility, Decreased knowledge of use of DME, Body mechanics, Cardiopulmonary status limiting activity, Dexterity, FMC, Strength, Tone, ROM, Mobility       Visit Diagnosis: Muscle weakness (generalized) - Plan: Ot plan of care cert/re-cert  Chronic right shoulder pain - Plan: Ot plan of care cert/re-cert  Pain in left arm - Plan: Ot plan of care cert/re-cert  Unsteadiness on feet - Plan: Ot plan of care cert/re-cert  Stiffness of right shoulder, not elsewhere classified - Plan: Ot plan of care cert/re-cert  Stiffness of right elbow, not elsewhere  classified - Plan: Ot plan of care  cert/re-cert    Problem List Patient Active Problem List   Diagnosis Date Noted  . Dyspnea   . Knee pain   . Rhabdomyolysis due to statin therapy   . Demand ischemia ([place])   . Sepsis ([place]) 05/13/2019  . NSTEMI (non-ST elevated myocardial infarction) ([facility]) 05/13/2019  . Coronary artery disease 02/12/2019  . Hyperlipidemia 02/12/2019  . Unstable angina ([place])   . Near syncope 12/14/2018  . Chest tightness 12/14/2018  . Prolonged Q-T interval on ECG   . Acute gastroenteritis 08/02/2012  . Dehydration 08/02/2012  . AKI (acute kidney injury) ([place]) 08/02/2012  . Hypokalemia 08/02/2012  . Diabetes mellitus ([place]) 08/02/2012  . HTN (hypertension) 08/02/2012  . Tobacco abuse 08/02/2012  . ALLERGIC RHINITIS WITH CONJUNCTIVITIS 10/21/2010  . ROTATOR CUFF SYNDROME, RIGHT 10/21/2010  . SEBACEOUS CYST, INFECTED 01/31/2010  . GUAIAC POSITIVE STOOL 10/05/2009  . TOBACCO ABUSE 09/21/2009  . RECTAL BLEEDING, HX OF 09/21/2009  . Nonspecific (abnormal) findings on radiological and other examination of body structure 05/19/2009  . RIGHT LOWER LUNG FIELD CRACKLES 05/19/2009  . Diabetes ([place]) 02/12/2009  . Pain in limb 02/12/2009  . DENTAL CARIES 04/21/2008  . HYPERTENSION 05/01/2007    Mariah Milling, OTR/L 06/02/2019, 5:58 PM  [facility] [address] [place], [place], [address] Phone: [phone]   Fax:  [phone]  Name: Krista [name] MRN: [id number] Date of Birth: January 17, 1961

## 2019-06-04 ENCOUNTER — Encounter: Payer: Self-pay | Admitting: Physician Assistant

## 2019-06-04 ENCOUNTER — Ambulatory Visit (INDEPENDENT_AMBULATORY_CARE_PROVIDER_SITE_OTHER): Payer: Medicaid Other | Admitting: Physician Assistant

## 2019-06-04 ENCOUNTER — Other Ambulatory Visit: Payer: Self-pay

## 2019-06-04 VITALS — BP 130/82 | HR 96 | Ht 63.0 in | Wt 184.8 lb

## 2019-06-04 DIAGNOSIS — I10 Essential (primary) hypertension: Secondary | ICD-10-CM

## 2019-06-04 DIAGNOSIS — E119 Type 2 diabetes mellitus without complications: Secondary | ICD-10-CM | POA: Diagnosis not present

## 2019-06-04 DIAGNOSIS — I2510 Atherosclerotic heart disease of native coronary artery without angina pectoris: Secondary | ICD-10-CM | POA: Diagnosis not present

## 2019-06-04 DIAGNOSIS — A419 Sepsis, unspecified organism: Secondary | ICD-10-CM

## 2019-06-04 DIAGNOSIS — T466X5A Adverse effect of antihyperlipidemic and antiarteriosclerotic drugs, initial encounter: Secondary | ICD-10-CM

## 2019-06-04 DIAGNOSIS — E785 Hyperlipidemia, unspecified: Secondary | ICD-10-CM

## 2019-06-04 DIAGNOSIS — M6282 Rhabdomyolysis: Secondary | ICD-10-CM

## 2019-06-04 MED ORDER — NITROGLYCERIN 0.4 MG SL SUBL: 0.4000 mg | SUBLINGUAL_TABLET | SUBLINGUAL | 2 refills | Status: DC | PRN

## 2019-06-04 NOTE — Progress Notes (Signed)
Cardiology Office Note    Date:  06/05/2019   ID:  Krista [name], DOB 10-May-1961, MRN [id number]  PCP:  Krista Mainland, FNP  Cardiologist:  Quay Burow, MD   Chief Complaint  Patient presents with   Follow-up    seen for Dr. Gwenlyn Found    History of Present Illness:  Krista [name] is a 58 y.o. female with past medical history of hypertension, tobacco abuse, and DM2 who was recently admitted on 12/13/2018 with sudden onset of dizziness, shortness of breath and palpitation.  She also complained of tightness in the throat.  Apparently patient did pass out and symptoms suggest a either vagal event or arrhythmia.  Cardiology was consulted for abnormal EKG which showed anterolateral T wave inversion.  Initial echocardiogram obtained on 12/14/2018 showed EF 50 to 55%, hypokinesis in the distal inferior and inferolateral wall, no significant valvular issue.  Given abnormal EKG and wall motion abnormality seen on echocardiogram, patient ultimately underwent cardiac catheterization on 12/16/2018 which revealed a severe 99% proximal to mid LAD successfully treated with 2.7 x 20 mm Synergy DES, normal LVEDP, normal EF.  She also had 60% proximal RCA lesion, 65% RPDA lesion, 85% ostial ramus lesion.  It was recommended to manage the RCA disease medically since she had diffuse disease which may require extensive amount of stents if PCI is needed.  Postprocedure, patient was placed on aspirin and Brilinta with recommendation to continue dual antiplatelet therapy for at least 1 year.  During the hospitalization, troponin peaked at 7.57 before trending back down.  Obtained during this hospitalization showed LDL 112, normal cholesterol, HDL and triglyceride.  She was also placed on high-dose statin.  Given her syncope, no driving for 6 months was recommended.    She was discharged with a 30-day event monitor.  Her monitor did not show any arrhythmia.  Due to the cost of Brilinta, she has been switched to  Plavix.  More recently, she was admitted in September generalized weakness.  She was unable to get up due to the severe weakness.  While in the hospital, she was short of breath and started on 2 L nasal cannula.  She was febrile with temperature of 102.  She was also tachycardic with heart rate of 120.  White blood cell count was elevated 13.4.  COVID-19 test was negative.  Creatinine also went up to 2.21.  Troponin was also elevated at 165.  She was admitted for sepsis and the fever abdominal origin.  Elevated troponin was felt to be due to demand ischemia, no further cardiac work-up was recommended.  A repeat echocardiogram on 05/13/2019 showed EF 60 to 65%, grade 2 DD, moderate to severe LVH, mildly dilated ascending aorta.  She also had rhabdomyolysis was elevated CK.  Statin was held.  Patient presents today for cardiology office visit.  She is recovering from rhabdomyolysis and transaminases, she still have a lot of muscle pain and joint pain.  I do not recommend reinitiating statin therapy at this time.  We may consider on the next office visit for PCSK9 inhibitor.  She denies any chest pain or shortness of breath.  She has established with a new PCP Dr. [name], she says her primary care provider stopped her lisinopril and metoprolol and switched her to long-acting diltiazem 180 mg daily.  I will request the office record.  Theoretically, metoprolol probably would be a better medication for her given her cardiac history, however she is doing very well on the  diltiazem with her blood pressure management.    Past Medical History:  Diagnosis Date   Arthritis    CAD (coronary artery disease)    Depression    Diabetes mellitus    Hypertension    NSTEMI (non-ST elevated myocardial infarction) [facility])     Past Surgical History:  Procedure Laterality Date   ANKLE SURGERY     left ankle - was broken   CORONARY STENT INTERVENTION N/A 12/16/2018   Procedure: CORONARY STENT INTERVENTION;   Surgeon: Leonie Man, MD;  Location: [facility];  Service: Cardiovascular;  Laterality: N/A;  LAD   LEFT HEART CATH AND CORONARY ANGIOGRAPHY N/A 12/16/2018   Procedure: LEFT HEART CATH AND CORONARY ANGIOGRAPHY;  Surgeon: Leonie Man, MD;  Location: [facility];  Service: Cardiovascular;  Laterality: N/A;    Current Medications: Outpatient Medications Prior to Visit  Medication Sig Dispense Refill   acetaminophen (TYLENOL) 500 MG tablet Take 1,000 mg by mouth every 6 (six) hours as needed for mild pain or headache.     aspirin EC 81 MG EC tablet Take 1 tablet (81 mg total) by mouth daily. 30 tablet 0   clopidogrel (PLAVIX) 75 MG tablet Take 75 mg by mouth daily.     diltiazem (DILACOR XR) 180 MG 24 hr capsule Take 180 mg by mouth daily.     ergocalciferol (VITAMIN D2) 1.25 MG (50000 UT) capsule Take 50,000 Units by mouth once a week.     loratadine (CLARITIN) 10 MG tablet Take 10 mg by mouth daily.     metFORMIN (GLUCOPHAGE) 1000 MG tablet Take 1,000 mg by mouth 2 (two) times daily with a meal.     nitroGLYCERIN (NITROSTAT) 0.4 MG SL tablet Place 1 tablet (0.4 mg total) under the tongue every 5 (five) minutes as needed for chest pain. 30 tablet 1   clopidogrel (PLAVIX) 75 MG tablet Take 1 tablet (75 mg total) by mouth daily. TAKE A LOADING DOSE OF 600 MG (8 TABLETS) ON Thursday 02/13/2019. STARTING Friday 02/14/2019 TAKE 1 TABLET (75MG ) ON A DAILY BASIS. (Patient taking differently: Take 75 mg by mouth daily. ) 90 tablet 3   lisinopril (ZESTRIL) 10 MG tablet Take 1 tablet (10 mg total) by mouth daily. (Patient not taking: Reported on 06/02/2019) 90 tablet 3   metoprolol tartrate (LOPRESSOR) 25 MG tablet Take 1 tablet (25 mg total) by mouth 2 (two) times daily. (Patient not taking: Reported on 06/02/2019) 90 tablet 3   No facility-administered medications prior to visit.      Allergies:   Patient has no known allergies.   Social History   Socioeconomic History    Marital status: Single    Spouse name: Not on file   Number of children: Not on file   Years of education: Not on file   Highest education level: Not on file  Occupational History   Not on file  Social Needs   Financial resource strain: Not on file   Food insecurity    Worry: Not on file    Inability: Not on file   Transportation needs    Medical: Not on file    Non-medical: Not on file  Tobacco Use   Smoking status: Current Every Day Smoker    Packs/day: 0.50    Years: 33.00    Pack years: 16.50    Types: Cigarettes   Smokeless tobacco: Never Used  Substance and Sexual Activity   Alcohol use: No   Drug use:  No   Sexual activity: Yes    Birth control/protection: Post-menopausal  Lifestyle   Physical activity    Days per week: Not on file    Minutes per session: Not on file   Stress: Not on file  Relationships   Social connections    Talks on phone: Not on file    Gets together: Not on file    Attends religious service: Not on file    Active member of club or organization: Not on file    Attends meetings of clubs or organizations: Not on file    Relationship status: Not on file  Other Topics Concern   Not on file  Social History Narrative   Not on file     Family History:  The patient's family history includes COPD in her mother; Diabetes in her father and mother; Hypertension in her father and mother.   ROS:   Please see the history of present illness.    ROS All other systems reviewed and are negative.   PHYSICAL EXAM:   VS:  BP 130/82    Pulse 96    Ht 5\' 3"  (1.6 m)    Wt 184 lb 12.8 oz (83.8 kg)    SpO2 95%    BMI 32.74 kg/m    GEN: Well nourished, well developed, in no acute distress  HEENT: normal  Neck: no JVD, carotid bruits, or masses Cardiac: RRR; no murmurs, rubs, or gallops,no edema  Respiratory:  clear to auscultation bilaterally, normal work of breathing GI: soft, nontender, nondistended, + BS MS: no deformity or atrophy    Skin: warm and dry, no rash Neuro:  Alert and Oriented x 3, Strength and sensation are intact Psych: euthymic mood, full affect  Wt Readings from Last 3 Encounters:  06/04/19 184 lb 12.8 oz (83.8 kg)  05/16/19 189 lb (85.7 kg)  02/12/19 180 lb (81.6 kg)      Studies/Labs Reviewed:   EKG:  EKG is not ordered today.    Recent Labs: 12/14/2018: TSH 1.149 05/13/2019: Magnesium 2.0 05/15/2019: Hemoglobin 13.1; Platelets 235 05/16/2019: ALT 96; BUN 5; Creatinine, Ser 0.68; Potassium 3.9; Sodium 139   Lipid Panel    Component Value Date/Time   CHOL 103 02/25/2019 1222   TRIG 84 02/25/2019 1222   HDL 37 (L) 02/25/2019 1222   CHOLHDL 2.8 02/25/2019 1222   CHOLHDL 4.1 12/14/2018 0134   VLDL 14 12/14/2018 0134   LDLCALC 49 02/25/2019 1222    Additional studies/ records that were reviewed today include:   Cath 12/16/2018  Prox LAD lesion is 99% stenosed with 99% stenosed side branch in Ost 1st Diag.  A drug-eluting stent was successfully placed using a STENT SYNERGY DES 2.75X20. Postdilated to 3.1 mm  Post intervention, there is a 0% residual stenosis.  Post intervention, the side branch was reduced to 90% residual stenosis with downstream 80% stenosis. Plan is to treat this medically.  ------------------------------------------------------  Prox RCA lesion is 60% stenosed. Mid RCA lesion is 35% stenosed. Mid RCA to Dist RCA lesion is 55% stenosed. RPDA lesion is 65% stenosed.  Ost Ramus to Ramus lesion is 45% stenosed.  The left ventricular systolic function is normal. The left ventricular ejection fraction is 50-55% by visual estimate. -Apical hypokinesis  ==============================   SUMMARY  Severe CAD with severe 99% proximal-mid LAD at takeoff of 1st Diag & Septal branches associated with severe 99% ostial stenosis in both branches (1st Diag is a relatively small caliber vessel and diffusely diseased),  diffuse moderate to severe 55 to 65% stenoses in proximal and mid  RCA as well as RPDA.  Successful DES PCI of LAD lesion with synergy DES 2.7 mm x 20 mm (3.1 mm) preserving diagonal and septal flow.  Preserved LVEF with apparent apical hypokinesis consistent with echocardiogram findings  Normal LVEDP  RECOMMENDATIONS  Return patient to nursing unit for ongoing care.  Anticipate potential discharge either later on this afternoon after bedrest versus tomorrow morning pending clinical evaluation.  Would recommend aggressive risk factor management modification: High-dose statin, smoking cessation counseling, close glycemic control, close blood pressure control using ACE inhibitor/ARB and beta-blocker if tolerated  For now plan will be to treat the RCA lesions medically.  Given the diffuse nature of disease, there would be extensive amount of stent uses to cover the entire diseased segment.  Would reevaluate symptoms once she has recovered from this acute event.  Dual antiplatelet coverage for minimum 1 year    Echo 05/13/2019 1. Left ventricular ejection fraction, by visual estimation, is 60 to 65%. The left ventricle has normal function. Normal left ventricular size. There is severely increased left ventricular hypertrophy.  2. Left ventricular diastolic Doppler parameters are consistent with pseudonormalization pattern of LV diastolic filling.  3. Global right ventricle has normal systolic function.The right ventricular size is normal.  4. Left atrial size was normal.  5. Right atrial size was normal.  6. The mitral valve is normal in structure. No evidence of mitral valve regurgitation. No evidence of mitral stenosis.  7. The tricuspid valve is normal in structure. Tricuspid valve regurgitation is mild.  8. The aortic valve is tricuspid Aortic valve regurgitation is trivial by color flow Doppler. Mild aortic valve sclerosis without stenosis.  9. The pulmonic valve was normal in structure. Pulmonic valve regurgitation is not visualized by color flow  Doppler. 10. Aortic dilatation noted. 11. There is mild dilatation of the ascending aorta measuring 38 mm. 12. Mildly elevated pulmonary artery systolic pressure. 13. The inferior vena cava is dilated in size with >50% respiratory variability, suggesting right atrial pressure of 8 mmHg. 14. Normal LV systolic function; grade 2 diastolic dysfunction; moderate to severe LVH; mildly dilated ascending aorta; trace AI; mild TR.   ASSESSMENT:    1. Coronary artery disease involving native coronary artery of native heart without angina pectoris   2. Essential hypertension   3. Controlled type 2 diabetes mellitus without complication, without long-term current use of insulin ([place])   4. Sepsis, due to unspecified organism, unspecified whether acute organ dysfunction present ([place])   5. Rhabdomyolysis due to statin therapy      PLAN:  In order of problems listed above:  1. CAD: Denies any recent chest pain.  Continue aspirin and Plavix.  Her PCP recently switched her lisinopril and metoprolol to diltiazem.  We will request records.  2. Hypertension: Blood pressure stable  3. DM2: Managed by primary care provider  4. Hyperlipidemia: Statin therapy was stopped recently due to rhabdomyolysis.  May consider PCSK9 inhibitor in the future  5. Rhabdomyolysis: Statin has been stopped.  Patient continued to have quite significant muscle and joint ache.  6. Sepsis: Of unknown etiology.  She is recovering slowly.   Medication Adjustments/Labs and Tests Ordered: Current medicines are reviewed at length with the patient today.  Concerns regarding medicines are outlined above.  Medication changes, Labs and Tests ordered today are listed in the Patient Instructions below. Patient Instructions  Medication Instructions:  Your physician recommends that you continue  on your current medications as directed. Please refer to the Current Medication list given to you today.  *If you need a refill on your  cardiac medications before your next appointment, please call your pharmacy*  Lab Work: NONE ordered at this time of appointment   If you have labs (blood work) drawn today and your tests are completely normal, you will receive your results only by:  [place] (if you have MyChart) OR  A paper copy in the mail If you have any lab test that is abnormal or we need to change your treatment, we will call you to review the results.  Testing/Procedures: NONE ordered at this time of appointment   Follow-Up: At [facility], you and your health needs are our priority.  As part of our continuing mission to provide you with exceptional heart care, we have created designated Provider Care Teams.  These Care Teams include your primary Cardiologist (physician) and Advanced Practice Providers (APPs -  Physician Assistants and Nurse Practitioners) who all work together to provide you with the care you need, when you need it.  Your next appointment:   3 months  The format for your next appointment:   In Person  Provider:   Quay Burow, MD  Other Instructions      Signed, Almyra Deforest, [place]  06/05/2019 11:31 PM    [address], [place], [place]  [address] Phone: [phone]; Fax: [phone]

## 2019-06-04 NOTE — Patient Instructions (Addendum)
Medication Instructions:  Your physician recommends that you continue on your current medications as directed. Please refer to the Current Medication list given to you today.  *If you need a refill on your cardiac medications before your next appointment, please call your pharmacy*  Lab Work: NONE ordered at this time of appointment   If you have labs (blood work) drawn today and your tests are completely normal, you will receive your results only by: . MyChart Message (if you have MyChart) OR . A paper copy in the mail If you have any lab test that is abnormal or we need to change your treatment, we will call you to review the results.  Testing/Procedures: NONE ordered at this time of appointment   Follow-Up: At [facility], you and your health needs are our priority.  As part of our continuing mission to provide you with exceptional heart care, we have created designated Provider Care Teams.  These Care Teams include your primary Cardiologist (physician) and Advanced Practice Providers (APPs -  Physician Assistants and Nurse Practitioners) who all work together to provide you with the care you need, when you need it.  Your next appointment:   3 month(s)  The format for your next appointment:   In Person  Provider:   Jonathan Berry, MD  Other Instructions   

## 2019-06-05 ENCOUNTER — Encounter: Payer: Self-pay | Admitting: Physician Assistant

## 2019-06-09 ENCOUNTER — Encounter: Payer: Self-pay | Admitting: Physical Therapy

## 2019-06-09 ENCOUNTER — Other Ambulatory Visit: Payer: Self-pay

## 2019-06-09 ENCOUNTER — Ambulatory Visit: Payer: Medicaid Other | Admitting: Physical Therapy

## 2019-06-09 DIAGNOSIS — R262 Difficulty in walking, not elsewhere classified: Secondary | ICD-10-CM | POA: Diagnosis not present

## 2019-06-09 DIAGNOSIS — M6281 Muscle weakness (generalized): Secondary | ICD-10-CM

## 2019-06-09 DIAGNOSIS — R2681 Unsteadiness on feet: Secondary | ICD-10-CM

## 2019-06-09 DIAGNOSIS — M79604 Pain in right leg: Secondary | ICD-10-CM

## 2019-06-09 NOTE — Patient Instructions (Signed)
Access Code: [email or web address]  URL: [email or web address]  Date: 06/09/2019  Prepared by: Elsie Ra   Exercises  Seated Long Arc Quad - 10 reps - 2-3 sets - 2x daily - 6x weekly  Seated Heel Toe Raises - 10 reps - 3 sets - 2x daily - 6x weekly  Standing Marching - 10 reps - 1-2 sets - 2x daily - 6x weekly  Standing Hip Abduction - 10 reps - 1-2 sets - 2x daily - 6x weekly  Romberg Stance - 3 reps - 1 sets - 30 hold - 2x daily - 6x weekly  Standing with Head Rotation - 10 reps - 3 sets - 2x daily - 6x weekly

## 2019-06-09 NOTE — Therapy (Signed)
[facility] [address] [place], [place], [address] Phone: [phone]   Fax:  [phone]  Physical Therapy Treatment  Patient Details  Name: Krista [name] MRN: [id number] Date of Birth: 1961-05-31 Referring Provider (PT): Willey Blade   Encounter Date: 06/09/2019  PT End of Session - 06/09/19 1358    Visit Number  2    Number of Visits  12    Date for PT Re-Evaluation  07/25/19    Authorization Type  Medicaid    Authorization Time Period  3 visits approaved 10/19 to 06/22/2019)    Authorization - Visit Number  1    Authorization - Number of Visits  3    PT Start Time  [id number]    PT Stop Time  1355    PT Time Calculation (min)  40 min    Activity Tolerance  Patient tolerated treatment well    Behavior During Therapy  [facility] for tasks assessed/performed       Past Medical History:  Diagnosis Date  . Arthritis   . CAD (coronary artery disease)   . Depression   . Diabetes mellitus   . Hypertension   . NSTEMI (non-ST elevated myocardial infarction) [facility])     Past Surgical History:  Procedure Laterality Date  . ANKLE SURGERY     left ankle - was broken  . CORONARY STENT INTERVENTION N/A 12/16/2018   Procedure: CORONARY STENT INTERVENTION;  Surgeon: Leonie Man, MD;  Location: [place] [facility];  Service: Cardiovascular;  Laterality: N/A;  LAD  . LEFT HEART CATH AND CORONARY ANGIOGRAPHY N/A 12/16/2018   Procedure: LEFT HEART CATH AND CORONARY ANGIOGRAPHY;  Surgeon: Leonie Man, MD;  Location: [place] [facility];  Service: Cardiovascular;  Laterality: N/A;    There were no vitals filed for this visit.  Subjective Assessment - 06/09/19 1326    Subjective  Relays 8/10 pain in her legs today. Other than that, she is doing okay.    Patient Stated Goals  Pt would like to get better to be able to move easier with less pain and more energy.                       [facility] Adult PT  Treatment/Exercise - 06/09/19 0001      Exercises   Exercises  Other Exercises    Other Exercises   Nu step 5 min L1 UE/LE. Sitting hamstring stretch 30 sec X 2 bilat, sitting LAQ 2X10 bilat. Standing with UE support of RW hamstring curls X 10 bilat, marching X 10 bilat, hip abd X 10 bilat. Then balance with feet together 30 sec X 2 and feet apart with head turns X 10 side to side with close supervision.               PT Short Term Goals - 05/26/19 1358      PT SHORT TERM GOAL #1   Title  Pt will be independent with HEP to continue to work on strength, balance and functional mobility gains at home.    Baseline  Currently does not have home program and limited mobility at home.    Time  4    Period  Weeks    Status  New    Target Date  06/25/19      PT SHORT TERM GOAL #2   Title  Pt will increase 30 sec sit to stand from 2 reps to 4 or more  for improved functional strength and mobility.    Baseline  2 reps on 30 sec sit to stand test on 05/26/19    Time  4    Period  Weeks    Status  New    Target Date  06/25/19      PT SHORT TERM GOAL #3   Title  Pt will decrease TUG from 28 sec to <22 sec for improved balance and decreased fall risk.    Baseline  28 sec on TUG on 05/26/19    Time  4    Period  Weeks    Status  New    Target Date  06/25/19      PT SHORT TERM GOAL #4   Title  Pt will ambulate >250' with FWW mod I for improved household and short community distances.    Baseline  60' with [place] supervision with increased fatigue    Time  4    Period  Weeks    Status  New    Target Date  06/25/19        PT Long Term Goals - 05/26/19 1402      PT LONG TERM GOAL #1   Title  Pt will increase 30 sec sit to stand to 6 or more for further improvement in functional strength and mobility.    Baseline  2 on 05/26/19    Time  8    Period  Weeks    Status  New    Target Date  07/25/19      PT LONG TERM GOAL #2   Title  Pt will increase gait speed from 0.35m/s to >0.6  m/s for improved gait safety in house and community.    Baseline  gait speed=0.90m/s on 05/26/19    Time  8    Period  Weeks    Status  New    Target Date  07/25/19      PT LONG TERM GOAL #3   Title  Pt will decrease TUG to <18 sec for improved balance and decreased fall risk.    Baseline  28 sec on 05/26/19    Time  8    Period  Weeks    Status  New    Target Date  07/25/19      PT LONG TERM GOAL #4   Title  Pt will ambulate >400' on varied surfaces with SPC  mod I for improved gait safety.    Baseline  27' with FWW supervision on level surfaces on 05/26/19    Time  8    Period  Weeks    Status  New    Target Date  07/25/19      PT LONG TERM GOAL #5   Title  Pt will report <6/10 pain in knees with functional mobility in home for improved function.    Baseline  8/10 pain in knees with movement on 05/26/19    Time  8    Period  Weeks    Status  New    Target Date  07/25/19            Plan - 06/09/19 1359    Clinical Impression Statement  Session focused on gentle progression of endurance, gait, bilat leg strength, and balance. Her HEP was updated to add this progression in. She will continue to benefit from gentle progression of these with PT.    Personal Factors and Comorbidities  Comorbidity 3+    Examination-Activity Limitations  Locomotion Level;Transfers;Squat;Stairs;Stand  Examination-Participation Restrictions  Community Activity;Driving;Shop;Meal Prep    Stability/Clinical Decision Making  Evolving/Moderate complexity    Rehab Potential  Good    PT Frequency  1x / week   followed by 2x/week for 4 weeks   PT Duration  3 weeks   followed by 2x/week for 4 weeks   PT Treatment/Interventions  ADLs/Self Care Home Management;Aquatic Therapy;DME Instruction;Gait training;Stair training;Functional mobility training;Therapeutic activities;Therapeutic exercise;Balance training;Patient/family education;Neuromuscular re-education;Energy conservation;Vestibular;Passive  range of motion    PT Next Visit Plan  Next visit gait training, transfers seeing how sit to stand exercise is going. Progress strengthening HEP.    PT Home Exercise Plan  Access Code: sit to stands plus [email or web address]    Consulted and Agree with Plan of Care  Patient       Patient will benefit from skilled therapeutic intervention in order to improve the following deficits and impairments:  Abnormal gait, Cardiopulmonary status limiting activity, Decreased activity tolerance, Decreased balance, Decreased strength, Decreased mobility, Decreased knowledge of use of DME, Impaired flexibility, Decreased endurance, Difficulty walking, Postural dysfunction, Decreased range of motion  Visit Diagnosis: Muscle weakness (generalized)  Unsteadiness on feet  Difficulty in walking, not elsewhere classified  Pain in both lower extremities     Problem List Patient Active Problem List   Diagnosis Date Noted  . Dyspnea   . Knee pain   . Rhabdomyolysis due to statin therapy   . Demand ischemia ([place])   . Sepsis ([place]) 05/13/2019  . NSTEMI (non-ST elevated myocardial infarction) ([place]) 05/13/2019  . Coronary artery disease 02/12/2019  . Hyperlipidemia 02/12/2019  . Unstable angina ([place])   . Near syncope 12/14/2018  . Chest tightness 12/14/2018  . Prolonged Q-T interval on ECG   . Acute gastroenteritis 08/02/2012  . Dehydration 08/02/2012  . AKI (acute kidney injury) ([place]) 08/02/2012  . Hypokalemia 08/02/2012  . Diabetes mellitus ([place]) 08/02/2012  . HTN (hypertension) 08/02/2012  . Tobacco abuse 08/02/2012  . ALLERGIC RHINITIS WITH CONJUNCTIVITIS 10/21/2010  . ROTATOR CUFF SYNDROME, RIGHT 10/21/2010  . SEBACEOUS CYST, INFECTED 01/31/2010  . GUAIAC POSITIVE STOOL 10/05/2009  . TOBACCO ABUSE 09/21/2009  . RECTAL BLEEDING, HX OF 09/21/2009  . Nonspecific (abnormal) findings on radiological and other examination of body structure 05/19/2009  . RIGHT LOWER LUNG FIELD CRACKLES 05/19/2009  . Diabetes  ([place]) 02/12/2009  . Pain in limb 02/12/2009  . DENTAL CARIES 04/21/2008  . HYPERTENSION 05/01/2007    Silvestre Mesi 06/09/2019, 2:02 PM  [facility] [address] [place], [place], [address] Phone: [phone]   Fax:  [phone]  Name: PAOLA KARAN MRN: [id number] Date of Birth: [birth year]

## 2019-06-16 ENCOUNTER — Other Ambulatory Visit: Payer: Self-pay

## 2019-06-16 ENCOUNTER — Ambulatory Visit: Payer: Medicaid Other | Attending: Family Medicine

## 2019-06-16 DIAGNOSIS — M6281 Muscle weakness (generalized): Secondary | ICD-10-CM

## 2019-06-16 DIAGNOSIS — M25611 Stiffness of right shoulder, not elsewhere classified: Secondary | ICD-10-CM | POA: Insufficient documentation

## 2019-06-16 DIAGNOSIS — M25511 Pain in right shoulder: Secondary | ICD-10-CM | POA: Diagnosis present

## 2019-06-16 DIAGNOSIS — R2681 Unsteadiness on feet: Secondary | ICD-10-CM | POA: Insufficient documentation

## 2019-06-16 DIAGNOSIS — M79602 Pain in left arm: Secondary | ICD-10-CM | POA: Insufficient documentation

## 2019-06-16 DIAGNOSIS — R262 Difficulty in walking, not elsewhere classified: Secondary | ICD-10-CM | POA: Insufficient documentation

## 2019-06-16 DIAGNOSIS — M25621 Stiffness of right elbow, not elsewhere classified: Secondary | ICD-10-CM | POA: Diagnosis present

## 2019-06-16 DIAGNOSIS — G8929 Other chronic pain: Secondary | ICD-10-CM | POA: Insufficient documentation

## 2019-06-16 NOTE — Therapy (Addendum)
[facility] [address] [place], [place], [address] Phone: [phone]   Fax:  [phone]  Physical Therapy Treatment  Patient Details  Name: Krista [name] MRN: [id number] Date of Birth: 02/19/1961 Referring Provider (PT): Willey Blade   Encounter Date: 06/16/2019  PT End of Session - 06/16/19 1236    Visit Number  3    Number of Visits  12    Date for PT Re-Evaluation  07/25/19    Authorization Type  Medicaid    Authorization Time Period  3 visits approaved 10/19 to 06/22/2019)    Authorization - Visit Number  2    Authorization - Number of Visits  3    PT Start Time  5465    PT Stop Time  1301   Pt requested to end early as tired.   PT Time Calculation (min)  30 min    Equipment Utilized During Treatment  Gait belt    Activity Tolerance  Patient tolerated treatment well    Behavior During Therapy  WFL for tasks assessed/performed       Past Medical History:  Diagnosis Date  . Arthritis   . CAD (coronary artery disease)   . Depression   . Diabetes mellitus   . Hypertension   . NSTEMI (non-ST elevated myocardial infarction) [facility])     Past Surgical History:  Procedure Laterality Date  . ANKLE SURGERY     left ankle - was broken  . CORONARY STENT INTERVENTION N/A 12/16/2018   Procedure: CORONARY STENT INTERVENTION;  Surgeon: Leonie Man, MD;  Location: [place] [facility];  Service: Cardiovascular;  Laterality: N/A;  LAD  . LEFT HEART CATH AND CORONARY ANGIOGRAPHY N/A 12/16/2018   Procedure: LEFT HEART CATH AND CORONARY ANGIOGRAPHY;  Surgeon: Leonie Man, MD;  Location: [facility] [facility];  Service: Cardiovascular;  Laterality: N/A;    There were no vitals filed for this visit.  Subjective Assessment - 06/16/19 1234    Subjective  Pt reports she is doing well. Pt reports she picked up 2 new meds yesterday and will bring next visit.    Patient Stated Goals  Pt would like to get better to be  able to move easier with less pain and more energy.    Currently in Pain?  Yes    Pain Score  8     Pain Location  Leg    Pain Orientation  Right;Left    Pain Descriptors / Indicators  Aching    Pain Type  Chronic pain                       [facility] Adult PT Treatment/Exercise - 06/16/19 1238      Ambulation/Gait   Ambulation/Gait  Yes    Ambulation/Gait Assistance  6: Modified independent (Device/Increase time)    Ambulation Distance (Feet)  115 Feet    Gait Pattern  Step-through pattern    Ambulation Surface  Level;Indoor    Gait Comments  Pt took a couple brief standing breaks due to increased right knee pain.       Neuro Re-ed    Neuro Re-ed Details   Standing with feet apart x 30 sec eyes open and x 30 sec eyes closed with increased sway. Standing with reaching for targets x 30 sec, standing eyes closed again x 30 sec.  Standing on airex with eyes open 30 sec x 2. Increased sway to the left and posterior.  Exercises   Exercises  Other Exercises    Other Exercises   Sit to stand 5 x 2 from mat. Standing at counter: left hamstring curls x 10, heel/toe raises x 10, side stepping along counter 6' x 4 with seated rest break in between.  Verbal cues for form.             PT Education - 06/16/19 1306    Education Details  Pt to continue with current HEP    Person(s) Educated  Patient    Methods  Explanation    Comprehension  Verbalized understanding       PT Short Term Goals - 05/26/19 1358      PT SHORT TERM GOAL #1   Title  Pt will be independent with HEP to continue to work on strength, balance and functional mobility gains at home.    Baseline  Started on initial HEP but needs further work   Time  4    Period  Weeks    Status  ongoing   Target Date  06/25/19      PT SHORT TERM GOAL #2   Title  Pt will increase 30 sec sit to stand from 2 reps to 4 or more for improved functional strength and mobility.    Baseline  2 reps on 30 sec sit to stand  test on 05/26/19    Time  4    Period  Weeks    Status  ongoing   Target Date  06/25/19      PT SHORT TERM GOAL #3   Title  Pt will decrease TUG from 28 sec to <22 sec for improved balance and decreased fall risk.    Baseline  28 sec on TUG on 05/26/19    Time  4    Period  Weeks    Status  ongoing   Target Date  06/25/19      PT SHORT TERM GOAL #4   Title  Pt will ambulate >250' with FWW mod I for improved household and short community distances.    Baseline  115' with FWW mod I   Time  4    Period  Weeks    Status  ongoing   Target Date  06/25/19        PT Long Term Goals - 05/26/19 1402      PT LONG TERM GOAL #1   Title  Pt will increase 30 sec sit to stand to 6 or more for further improvement in functional strength and mobility.    Baseline  2 on 05/26/19    Time  8    Period  Weeks    Status  New    Target Date  07/25/19      PT LONG TERM GOAL #2   Title  Pt will increase gait speed from 0.83ms to >0.6 m/s for improved gait safety in house and community.    Baseline  gait speed=0.432m on 05/26/19    Time  8    Period  Weeks    Status  New    Target Date  07/25/19      PT LONG TERM GOAL #3   Title  Pt will decrease TUG to <18 sec for improved balance and decreased fall risk.    Baseline  28 sec on 05/26/19    Time  8    Period  Weeks    Status  New    Target Date  07/25/19  PT LONG TERM GOAL #4   Title  Pt will ambulate >400' on varied surfaces with SPC  mod I for improved gait safety.    Baseline  12' with FWW supervision on level surfaces on 05/26/19    Time  8    Period  Weeks    Status  New    Target Date  07/25/19      PT LONG TERM GOAL #5   Title  Pt will report <6/10 pain in knees with functional mobility in home for improved function.    Baseline  8/10 pain in knees with movement on 05/26/19    Time  8    Period  Weeks    Status  New    Target Date  07/25/19            Plan - 06/16/19 1307    Clinical Impression Statement   Pt was reporting increased right knee pain with activities today. Needed frequent seated rest breaks. Pt unsteady with balance activities without UE support leaning to left and posterior with increased sway. Pt requested to end early as was tired.  06/23/2019 addendum: Pt missed 3rd visit. Pt has not met any goals yet but has made some progress with gait distance and starting initial HEP. Will benefit from skilled PT to continue to work on gait, strength and balance to improve her functional mobility at home and in the community.   Personal Factors and Comorbidities  Comorbidity 3+    Examination-Activity Limitations  Locomotion Level;Transfers;Squat;Stairs;Stand    Examination-Participation Restrictions  Community Activity;Driving;Shop;Meal Prep    Stability/Clinical Decision Making  Evolving/Moderate complexity    Rehab Potential  Good    PT Frequency  1x / week   followed by 2x/week for 4 weeks   PT Duration  3 weeks   followed by 2x/week for 4 weeks   PT Treatment/Interventions  ADLs/Self Care Home Management;Aquatic Therapy;DME Instruction;Gait training;Stair training;Functional mobility training;Therapeutic activities;Therapeutic exercise;Balance training;Patient/family education;Neuromuscular re-education;Energy conservation;Vestibular;Passive range of motion    PT Next Visit Plan  Next visit gait training, balance,  Progress strengthening HEP. Request more medicaid visits.    PT Home Exercise Plan  Access Code: sit to stands plus [email or web address]    Consulted and Agree with Plan of Care  Patient       Patient will benefit from skilled therapeutic intervention in order to improve the following deficits and impairments:  Abnormal gait, Cardiopulmonary status limiting activity, Decreased activity tolerance, Decreased balance, Decreased strength, Decreased mobility, Decreased knowledge of use of DME, Impaired flexibility, Decreased endurance, Difficulty walking, Postural dysfunction, Decreased range of  motion  Visit Diagnosis: Muscle weakness (generalized)  Difficulty in walking, not elsewhere classified     Problem List Patient Active Problem List   Diagnosis Date Noted  . Dyspnea   . Knee pain   . Rhabdomyolysis due to statin therapy   . Demand ischemia ([place])   . Sepsis ([place]) 05/13/2019  . NSTEMI (non-ST elevated myocardial infarction) ([place]) 05/13/2019  . Coronary artery disease 02/12/2019  . Hyperlipidemia 02/12/2019  . Unstable angina ([place])   . Near syncope 12/14/2018  . Chest tightness 12/14/2018  . Prolonged Q-T interval on ECG   . Acute gastroenteritis 08/02/2012  . Dehydration 08/02/2012  . AKI (acute kidney injury) ([place]) 08/02/2012  . Hypokalemia 08/02/2012  . Diabetes mellitus ([place]) 08/02/2012  . HTN (hypertension) 08/02/2012  . Tobacco abuse 08/02/2012  . ALLERGIC RHINITIS WITH CONJUNCTIVITIS 10/21/2010  . ROTATOR CUFF SYNDROME, RIGHT 10/21/2010  . SEBACEOUS  CYST, INFECTED 01/31/2010  . GUAIAC POSITIVE STOOL 10/05/2009  . TOBACCO ABUSE 09/21/2009  . RECTAL BLEEDING, HX OF 09/21/2009  . Nonspecific (abnormal) findings on radiological and other examination of body structure 05/19/2009  . RIGHT LOWER LUNG FIELD CRACKLES 05/19/2009  . Diabetes ([place]) 02/12/2009  . Pain in limb 02/12/2009  . DENTAL CARIES 04/21/2008  . HYPERTENSION 05/01/2007    Electa Sniff, PT, DPT, NCS 06/16/2019, 1:10 PM  [facility] [address] [place], [place], [address] Phone: [phone]   Fax:  [phone]  Name: TERRILYNN POSTELL MRN: [id number] Date of Birth: [birth year]

## 2019-06-19 ENCOUNTER — Ambulatory Visit: Payer: Medicaid Other | Admitting: Occupational Therapy

## 2019-06-23 ENCOUNTER — Ambulatory Visit: Payer: Medicaid Other

## 2019-06-26 ENCOUNTER — Ambulatory Visit: Payer: Medicaid Other | Admitting: Occupational Therapy

## 2019-06-26 ENCOUNTER — Other Ambulatory Visit: Payer: Self-pay

## 2019-06-26 DIAGNOSIS — M6281 Muscle weakness (generalized): Secondary | ICD-10-CM

## 2019-06-26 DIAGNOSIS — M25611 Stiffness of right shoulder, not elsewhere classified: Secondary | ICD-10-CM

## 2019-06-26 DIAGNOSIS — M79602 Pain in left arm: Secondary | ICD-10-CM

## 2019-06-26 DIAGNOSIS — G8929 Other chronic pain: Secondary | ICD-10-CM

## 2019-06-26 DIAGNOSIS — M25621 Stiffness of right elbow, not elsewhere classified: Secondary | ICD-10-CM

## 2019-06-26 NOTE — Patient Instructions (Signed)
  Supine: Chest Press (Active)    Lie on back with arms fully extended. Lower bar or dowel slowly to chest and press to arm's length. Complete _1__ sets of _10__ repetitions. Perform _3__ sessions per day.  SHOULDER: Flexion - Supine (Cane)    Hold cane in both hands. Raise arms up overhead. Do not allow back to arch. Hold _3__ seconds. __10_ reps per set, _3__ sets per day  ROM: External Rotation - Wand (Supine)    Lie on back holding wand with elbows bent to 90. Rotate forearms over head as far as possible, then to belly button.   Repeat _10___ times per set. Do __3__ sessions per day.  Abduction (Eccentric) - Active (Cane)    Laying down - lift cane out to side with affected arm. Avoid hiking shoulder. Keep palm relaxed. Slowly lower affected arm for 3-5 seconds. (the arm going up is the palm that should be up) _10__ reps per set, _3__ sets per day   1. Grip Strengthening (Resistive Putty)   Squeeze putty using thumb and all fingers. Repeat _15__ times. Do _3__ sessions per day.   2. Roll putty into tube on table and pinch between first two fingers and thumb x 10 reps. Do 3 sessions per day    Copyright  VHI. All rights reserved.

## 2019-06-26 NOTE — Therapy (Signed)
[facility] [address] [place], [place], [address] Phone: [phone]   Fax:  [phone]  Occupational Therapy Treatment  Patient Details  Name: Krista [name] MRN: [id number] Date of Birth: 07-Aug-1961 Referring Provider (OT): Willey Blade (FNP)   Encounter Date: 06/26/2019  OT End of Session - 06/26/19 1232    Visit Number  2    Number of Visits  9    Date for OT Re-Evaluation  08/14/19    Authorization Type  Medicaid    Authorization Time Period  Approved 8 visits 06/19/19 - 08/13/19    Authorization - Visit Number  1    Authorization - Number of Visits  8    OT Start Time  1150    OT Stop Time  1230    OT Time Calculation (min)  40 min    Activity Tolerance  Patient tolerated treatment well       Past Medical History:  Diagnosis Date  . Arthritis   . CAD (coronary artery disease)   . Depression   . Diabetes mellitus   . Hypertension   . NSTEMI (non-ST elevated myocardial infarction) [facility])     Past Surgical History:  Procedure Laterality Date  . ANKLE SURGERY     left ankle - was broken  . CORONARY STENT INTERVENTION N/A 12/16/2018   Procedure: CORONARY STENT INTERVENTION;  Surgeon: Leonie Man, MD;  Location: [facility];  Service: Cardiovascular;  Laterality: N/A;  LAD  . LEFT HEART CATH AND CORONARY ANGIOGRAPHY N/A 12/16/2018   Procedure: LEFT HEART CATH AND CORONARY ANGIOGRAPHY;  Surgeon: Leonie Man, MD;  Location: [facility];  Service: Cardiovascular;  Laterality: N/A;    There were no vitals filed for this visit.  Subjective Assessment - 06/26/19 1158    Pertinent History  rhabdomyolysis. PMH: CAD, OA, DM, HTN, MI 12/13/18    Currently in Pain?  Yes    Pain Score  8     Pain Location  --   arms and legs   Pain Orientation  Right;Left    Pain Descriptors / Indicators  Aching    Pain Type  Chronic pain    Pain Onset  More than a month ago    Pain Frequency   Constant    Aggravating Factors   rainy weather    Pain Relieving Factors  tylenol       Pt issued cane HEP in supine - see pt instructions for details. Pt issued yellow putty HEP for grip and pinch strength bilaterally - see pt instructions for details.                     OT Education - 06/26/19 1225    Education Details  HEP for bilateral UE's and putty HEP    Person(s) Educated  Patient    Methods  Explanation;Demonstration;Handout    Comprehension  Verbalized understanding;Returned demonstration       OT Short Term Goals - 06/26/19 1233      OT SHORT TERM GOAL #1   Title  Patient will complete HEP designed to improve BUE AROM without assistance    Baseline  No current exercise program    Time  4    Period  Weeks    Status  On-going    Target Date  07/09/19      OT SHORT TERM GOAL #2   Title  Patient will ambulate in kitchen and prepare  a simple cold snack and beverage  for herself without assistance    Baseline  Patient is currently reliant on her mother for all meals    Time  4    Period  Weeks    Status  New      OT SHORT TERM GOAL #3   Title  Patient will stand to complete simple housekeeping task for greater than 10 minutes without rest    Baseline  Unable to stand more than 10 min    Time  4    Period  Weeks    Status  New      OT SHORT TERM GOAL #4   Title  Patient will report pain no more than 4/10 in right shoulder with passive range of motion exercise    Baseline  7/10 at rest    Time  4    Period  Weeks    Status  New        OT Long Term Goals - 06/02/19 1747      OT LONG TERM GOAL #1   Title  Patient will complete a home activities program designed to improve activity tolerance with daily life skills    Baseline  Patient reliant on elderly mother for ADL/IADL    Time  8    Period  Weeks    Status  New    Target Date  08/14/19      OT LONG TERM GOAL #2   Title  Patient will complete a HEP designed to improve grip strength  and finger range of motion    Baseline  Not currently exercising UE's    Time  8    Period  Weeks    Status  New      OT LONG TERM GOAL #3   Title  Patient will complete all bathing, dressing, grooming at modified independent level    Baseline  Min assist    Time  8    Period  Weeks    Status  New      OT LONG TERM GOAL #4   Title  Patient will complete simple warm meal prep at least 2 items  for she and her parents without assistance    Baseline  Patient unable to cook currently    Time  8    Period  Weeks    Status  New      OT LONG TERM GOAL #5   Title  Patient will complete at least 15-20 min of simple housekeeping tasks without rest break    Baseline  Is not currently completing, reliant on mother    Time  8    Period  Weeks    Status  New      Long Term Additional Goals   Additional Long Term Goals  Yes      OT LONG TERM GOAL #6   Title  Patient will demonstrate a 5 lb increase in grip strength in BUE  to aide with opening or carrying items    Baseline  R- 25LB, L- 15 LB    Time  8    Period  Weeks    Status  New            Plan - 06/26/19 1233    Clinical Impression Statement  Pt tolerating HEP w/ some pain and making progress towards STG #1.    Occupational performance deficits (Please refer to evaluation for details):  ADL's;IADL's;Rest and Sleep;Work;Leisure    Body  Structure / Function / Physical Skills  ADL;Coordination;Endurance;GMC;UE functional use;Balance;Decreased knowledge of precautions;Fascial restriction;Pain;IADL;Flexibility;Decreased knowledge of use of DME;Body mechanics;Cardiopulmonary status limiting activity;Dexterity;FMC;Strength;Tone;ROM;Mobility    Rehab Potential  Good    OT Frequency  1x / week    OT Duration  8 weeks    OT Treatment/Interventions  Self-care/ADL training;Iontophoresis;Therapeutic exercise;Electrical Stimulation;Patient/family education;Splinting;Neuromuscular education;Moist Heat;Aquatic  Therapy;Fluidtherapy;Functional Mobility Training;Energy conservation;Therapeutic activities;Balance training;Manual Therapy;DME and/or AE instruction;Ultrasound;Cryotherapy    Plan  Review HEP prn, progress towards goals    Consulted and Agree with Plan of Care  Patient       Patient will benefit from skilled therapeutic intervention in order to improve the following deficits and impairments:   Body Structure / Function / Physical Skills: ADL, Coordination, Endurance, GMC, UE functional use, Balance, Decreased knowledge of precautions, Fascial restriction, Pain, IADL, Flexibility, Decreased knowledge of use of DME, Body mechanics, Cardiopulmonary status limiting activity, Dexterity, FMC, Strength, Tone, ROM, Mobility       Visit Diagnosis: Muscle weakness (generalized)  Stiffness of right shoulder, not elsewhere classified  Stiffness of right elbow, not elsewhere classified  Chronic right shoulder pain  Pain in left arm    Problem List Patient Active Problem List   Diagnosis Date Noted  . Dyspnea   . Knee pain   . Rhabdomyolysis due to statin therapy   . Demand ischemia ([place])   . Sepsis ([place]) 05/13/2019  . NSTEMI (non-ST elevated myocardial infarction) ([place]) 05/13/2019  . Coronary artery disease 02/12/2019  . Hyperlipidemia 02/12/2019  . Unstable angina ([place])   . Near syncope 12/14/2018  . Chest tightness 12/14/2018  . Prolonged Q-T interval on ECG   . Acute gastroenteritis 08/02/2012  . Dehydration 08/02/2012  . AKI (acute kidney injury) ([place]) 08/02/2012  . Hypokalemia 08/02/2012  . Diabetes mellitus ([place]) 08/02/2012  . HTN (hypertension) 08/02/2012  . Tobacco abuse 08/02/2012  . ALLERGIC RHINITIS WITH CONJUNCTIVITIS 10/21/2010  . ROTATOR CUFF SYNDROME, RIGHT 10/21/2010  . SEBACEOUS CYST, INFECTED 01/31/2010  . GUAIAC POSITIVE STOOL 10/05/2009  . TOBACCO ABUSE 09/21/2009  . RECTAL BLEEDING, HX OF 09/21/2009  . Nonspecific (abnormal) findings on radiological and  other examination of body structure 05/19/2009  . RIGHT LOWER LUNG FIELD CRACKLES 05/19/2009  . Diabetes ([place]) 02/12/2009  . Pain in limb 02/12/2009  . DENTAL CARIES 04/21/2008  . HYPERTENSION 05/01/2007    Carey Bullocks, OTR/L 06/26/2019, 2:19 PM  [facility] [address] [place], [place], [address] Phone: [phone]   Fax:  [phone]  Name: Krista [name] MRN: [id number] Date of Birth: Dec 11, 1960

## 2019-06-30 ENCOUNTER — Ambulatory Visit: Payer: Medicaid Other

## 2019-06-30 ENCOUNTER — Other Ambulatory Visit: Payer: Self-pay

## 2019-06-30 DIAGNOSIS — M6281 Muscle weakness (generalized): Secondary | ICD-10-CM

## 2019-06-30 DIAGNOSIS — R262 Difficulty in walking, not elsewhere classified: Secondary | ICD-10-CM

## 2019-06-30 NOTE — Therapy (Signed)
[facility] [address] [place], [place], [address] Phone: [phone]   Fax:  [phone]  Physical Therapy Treatment  Patient Details  Name: Krista [name] MRN: [id number] Date of Birth: Jan 17, 1961 Referring Provider (Krista [name]): Willey Blade   Encounter Date: 06/30/2019  Krista [name] End of Session - 06/30/19 1234    Visit Number  4    Authorization Time Period  8 visits 06/29/1212/20    Authorization - Visit Number  3    Authorization - Number of Visits  10    Krista [name] Start Time  9030    Krista [name] Stop Time  1310    Krista [name] Time Calculation (min)  40 min       Past Medical History:  Diagnosis Date  . Arthritis   . CAD (coronary artery disease)   . Depression   . Diabetes mellitus   . Hypertension   . NSTEMI (non-ST elevated myocardial infarction) [facility])     Past Surgical History:  Procedure Laterality Date  . ANKLE SURGERY     left ankle - was broken  . CORONARY STENT INTERVENTION N/A 12/16/2018   Procedure: CORONARY STENT INTERVENTION;  Surgeon: Leonie Man, MD;  Location: [facility];  Service: Cardiovascular;  Laterality: N/A;  LAD  . LEFT HEART CATH AND CORONARY ANGIOGRAPHY N/A 12/16/2018   Procedure: LEFT HEART CATH AND CORONARY ANGIOGRAPHY;  Surgeon: Leonie Man, MD;  Location: [facility];  Service: Cardiovascular;  Laterality: N/A;    There were no vitals filed for this visit.  Subjective Assessment - 06/30/19 1233    Subjective  Krista [name] reports that she is doing ok. Still tires quickly and has joint pain. Krista [name] reports that she feels like she has been itching more since started on pravastatin. Goes Wednesday to PCP and will discuss.    Patient Stated Goals  Krista [name] would like to get better to be able to move easier with less pain and more energy.    Currently in Pain?  Yes    Pain Score  8     Pain Location  Knee    Pain Orientation  Right;Left    Pain Descriptors / Indicators  Aching    Pain Type  Chronic  pain    Pain Onset  More than a month ago    Pain Frequency  Constant                       [facility] Adult Krista [name] Treatment/Exercise - 06/30/19 1241      Transfers   Transfers  Sit to Stand;Stand to Sit    Sit to Stand  6: Modified independent (Device/Increase time)    Sit to Stand Details  Verbal cues for technique    Comments  30 sec sit to stand=4 reps from edge of mat. Verbal cues to push with one hand from chair and not both on walker to rise.       Ambulation/Gait   Ambulation/Gait  Yes    Ambulation/Gait Assistance  6: Modified independent (Device/Increase time)    Ambulation Distance (Feet)  115 Feet    Assistive device  Rolling walker    Gait Pattern  Step-through pattern;Decreased step length - right;Decreased step length - left    Ambulation Surface  Level;Indoor    Gait Comments  RPE=8/10, HR=100 and O2 sat=97% after gait.  After seated rest break Krista [name] ambulated another 14' with FWW. HR=104 and O2 sat=97%.  Standardized Balance Assessment   Standardized Balance Assessment  Timed Up and Go Test      Timed Up and Go Test   TUG  Normal TUG    Normal TUG (seconds)  27   average of 29 and 25 sec with FWW     Neuro Re-ed    Neuro Re-ed Details   Standing with feet apart without UE support x 30 sec, standing with head turns x 6 each side with increased sway. Fatigued after and had to sit and rest.       Exercises   Exercises  Other Exercises    Other Exercises   Seated LAQ 10 x 2 bilateral, seatd hip flexion x 10 bilateral, Seated hip abd and adduction x 10 each with manual resistance.                Krista [name] Short Term Goals - 06/30/19 1257      Krista [name] SHORT TERM GOAL #1   Title  Krista [name] will be independent with HEP to continue to work on strength, balance and functional mobility gains at home.    Baseline  Currently does not have home program and limited mobility at home.    Time  4    Period  Weeks    Status  New    Target Date  06/25/19      Krista [name] SHORT  TERM GOAL #2   Title  Krista [name] will increase 30 sec sit to stand from 2 reps to 4 or more for improved functional strength and mobility.    Baseline  4 sit to stands in 30 sec on 06/30/19    Time  4    Period  Weeks    Status  Achieved    Target Date  06/25/19      Krista [name] SHORT TERM GOAL #3   Title  Krista [name] will decrease TUG from 28 sec to <22 sec for improved balance and decreased fall risk.    Baseline  29 sec and 25 sec with average of 27 sec on 06/30/19    Time  4    Period  Weeks    Status  On-going    Target Date  06/25/19      Krista [name] SHORT TERM GOAL #4   Title  Krista [name] will ambulate >250' with FWW mod I for improved household and short community distances.    Baseline  115' mod I with FWW.    Time  4    Period  Weeks    Status  On-going    Target Date  06/25/19        Krista [name] Long Term Goals - 05/26/19 1402      Krista [name] LONG TERM GOAL #1   Title  Krista [name] will increase 30 sec sit to stand to 6 or more for further improvement in functional strength and mobility.    Baseline  2 on 05/26/19    Time  8    Period  Weeks    Status  New    Target Date  07/25/19      Krista [name] LONG TERM GOAL #2   Title  Krista [name] will increase gait speed from 0.31ms to >0.6 m/s for improved gait safety in house and community.    Baseline  gait speed=0.45m on 05/26/19    Time  8    Period  Weeks    Status  New    Target Date  07/25/19      Krista [name] LONG TERM GOAL #3  Title  Krista [name] will decrease TUG to <18 sec for improved balance and decreased fall risk.    Baseline  28 sec on 05/26/19    Time  8    Period  Weeks    Status  New    Target Date  07/25/19      Krista [name] LONG TERM GOAL #4   Title  Krista [name] will ambulate >400' on varied surfaces with SPC  mod I for improved gait safety.    Baseline  32' with FWW supervision on level surfaces on 05/26/19    Time  8    Period  Weeks    Status  New    Target Date  07/25/19      Krista [name] LONG TERM GOAL #5   Title  Krista [name] will report <6/10 pain in knees with functional mobility in home for improved function.     Baseline  8/10 pain in knees with movement on 05/26/19    Time  8    Period  Weeks    Status  New    Target Date  07/25/19            Plan - 06/30/19 1313    Clinical Impression Statement  Krista [name] met 30 sec sit to stand goal showing improving functional strength. Krista [name] does fatigue quickly and need frequent seated rest breaks.    Personal Factors and Comorbidities  Comorbidity 3+    Examination-Activity Limitations  Locomotion Level;Transfers;Squat;Stairs;Stand    Examination-Participation Restrictions  Community Activity;Driving;Shop;Meal Prep    Stability/Clinical Decision Making  Evolving/Moderate complexity    Rehab Potential  Good    Krista [name] Frequency  1x / week   followed by 2x/week for 4 weeks   Krista [name] Duration  3 weeks   followed by 2x/week for 4 weeks   Krista [name] Treatment/Interventions  ADLs/Self Care Home Management;Aquatic Therapy;DME Instruction;Gait training;Stair training;Functional mobility training;Therapeutic activities;Therapeutic exercise;Balance training;Patient/family education;Neuromuscular re-education;Energy conservation;Vestibular;Passive range of motion    Krista [name] Next Visit Plan  Next visit gait training, balance, and strengthening breaking up activities.    Krista [name] Home Exercise Plan  Access Code: sit to stands plus [email or web address]    Consulted and Agree with Plan of Care  Patient       Patient will benefit from skilled therapeutic intervention in order to improve the following deficits and impairments:  Abnormal gait, Cardiopulmonary status limiting activity, Decreased activity tolerance, Decreased balance, Decreased strength, Decreased mobility, Decreased knowledge of use of DME, Impaired flexibility, Decreased endurance, Difficulty walking, Postural dysfunction, Decreased range of motion  Visit Diagnosis: Muscle weakness (generalized)  Difficulty in walking, not elsewhere classified     Problem List Patient Active Problem List   Diagnosis Date Noted  . Dyspnea   . Knee pain   .  Rhabdomyolysis due to statin therapy   . Demand ischemia ([place])   . Sepsis ([place]) 05/13/2019  . NSTEMI (non-ST elevated myocardial infarction) ([place]) 05/13/2019  . Coronary artery disease 02/12/2019  . Hyperlipidemia 02/12/2019  . Unstable angina ([place])   . Near syncope 12/14/2018  . Chest tightness 12/14/2018  . Prolonged Q-T interval on ECG   . Acute gastroenteritis 08/02/2012  . Dehydration 08/02/2012  . AKI (acute kidney injury) ([place]) 08/02/2012  . Hypokalemia 08/02/2012  . Diabetes mellitus ([place]) 08/02/2012  . HTN (hypertension) 08/02/2012  . Tobacco abuse 08/02/2012  . ALLERGIC RHINITIS WITH CONJUNCTIVITIS 10/21/2010  . ROTATOR CUFF SYNDROME, RIGHT 10/21/2010  . SEBACEOUS CYST, INFECTED 01/31/2010  . GUAIAC POSITIVE STOOL 10/05/2009  . TOBACCO ABUSE 09/21/2009  .  RECTAL BLEEDING, HX OF 09/21/2009  . Nonspecific (abnormal) findings on radiological and other examination of body structure 05/19/2009  . RIGHT LOWER LUNG FIELD CRACKLES 05/19/2009  . Diabetes ([place]) 02/12/2009  . Pain in limb 02/12/2009  . DENTAL CARIES 04/21/2008  . HYPERTENSION 05/01/2007    Krista [name], Krista [name], Krista [name], Krista [name] 06/30/2019, 1:15 PM  [facility] [address] [place], [place], [address] Phone: [phone]   Fax:  [phone]  Name: Krista [name] MRN: [id number] Date of Birth: [birth year]

## 2019-06-30 NOTE — Patient Instructions (Signed)
Access Code: [email or web address]  URL: [email or web address]  Date: 06/30/2019  Prepared by: Cherly Anderson   Exercises Seated Long Arc Quad - 10 reps - 2-3 sets - 2x daily - 6x weekly Seated Heel Toe Raises - 10 reps - 3 sets - 2x daily - 6x weekly Standing Marching - 10 reps - 1-2 sets - 2x daily - 6x weekly Standing Hip Abduction - 10 reps - 1-2 sets - 2x daily - 6x weekly Romberg Stance - 3 reps - 1 sets - 30 hold - 2x daily - 6x weekly Standing with Head Rotation - 10 reps - 3 sets - 2x daily - 6x weekly Seated Hip Abduction with Resistance - 10 reps - 3 sets - 1x daily                            - 7x weekly Seated Hip Adduction Squeeze with Ball - 10 reps - 3 sets - 1x daily - 7x weekly

## 2019-07-02 ENCOUNTER — Ambulatory Visit: Payer: Medicaid Other

## 2019-07-02 ENCOUNTER — Other Ambulatory Visit: Payer: Self-pay

## 2019-07-02 DIAGNOSIS — M6281 Muscle weakness (generalized): Secondary | ICD-10-CM

## 2019-07-02 DIAGNOSIS — R262 Difficulty in walking, not elsewhere classified: Secondary | ICD-10-CM

## 2019-07-02 NOTE — Therapy (Signed)
[facility] [address] [place], [place], [address] Phone: [phone]   Fax:  [phone]  Physical Therapy Treatment  Patient Details  Name: Krista [name] MRN: [id number] Date of Birth: 1960/12/30 Referring Provider (PT): Willey Blade   Encounter Date: 07/02/2019  PT End of Session - 07/02/19 1234    Visit Number  5    Authorization Time Period  8 visits 06/29/1212/20    Authorization - Visit Number  4    Authorization - Number of Visits  10    PT Start Time  1232    PT Stop Time  1314    PT Time Calculation (min)  42 min       Past Medical History:  Diagnosis Date  . Arthritis   . CAD (coronary artery disease)   . Depression   . Diabetes mellitus   . Hypertension   . NSTEMI (non-ST elevated myocardial infarction) [facility])     Past Surgical History:  Procedure Laterality Date  . ANKLE SURGERY     left ankle - was broken  . CORONARY STENT INTERVENTION N/A 12/16/2018   Procedure: CORONARY STENT INTERVENTION;  Surgeon: Leonie Man, MD;  Location: [facility];  Service: Cardiovascular;  Laterality: N/A;  LAD  . LEFT HEART CATH AND CORONARY ANGIOGRAPHY N/A 12/16/2018   Procedure: LEFT HEART CATH AND CORONARY ANGIOGRAPHY;  Surgeon: Leonie Man, MD;  Location: [facility];  Service: Cardiovascular;  Laterality: N/A;    There were no vitals filed for this visit.  Subjective Assessment - 07/02/19 1234    Subjective  Pt reports that she saw PCP today and they have stopped the new statin med as she was just itching so bad.    Patient Stated Goals  Pt would like to get better to be able to move easier with less pain and more energy.    Currently in Pain?  Yes    Pain Score  8     Pain Location  Knee   and under arms   Pain Orientation  Right;Left    Pain Descriptors / Indicators  Aching    Pain Type  Chronic pain    Pain Onset  More than a month ago    Pain Frequency  Constant                        [facility] Adult PT Treatment/Exercise - 07/02/19 1237      Ambulation/Gait   Ambulation/Gait  Yes    Ambulation/Gait Assistance  6: Modified independent (Device/Increase time)    Ambulation Distance (Feet)  230 Feet    Assistive device  Rolling walker    Gait Pattern  Step-through pattern;Decreased step length - right;Decreased step length - left    Ambulation Surface  Level;Indoor    Gait Comments  Pt took 2 bried standing rest breaks throughout. Reports feeling tired throughout and a little winded.  HR=80 after. Verbal cues with gait to try to increase step length.      Neuro Re-ed    Neuro Re-ed Details   Pt performed standing balance in corner: standing on pillow eyes open x 30 sec, eyes closed x 30 sec, head turns up/down and left/right x 10 supervision.      Exercises   Exercises  Other Exercises    Other Exercises   Pt performed seated LAQ 10 x 2 bilateral with breaking right side up in to sets of  5 as more painful.  Seated hip flexion 5 x 4 bilateral.  Seated hip abd with light manual resistance x 10 then pillow squeezes x 10. Sidelying clamshell 10 x 2 bilateral with verbal and tactile cues for form.  Nustep x 5 min level 3 with frequent rest breaks throughout.             PT Education - 07/02/19 1426    Education Details  Added clamshell to HEP and balance exercises to HEP in corner    Person(s) Educated  Patient    Methods  Explanation;Handout    Comprehension  Verbalized understanding;Returned demonstration       PT Short Term Goals - 06/30/19 1257      PT SHORT TERM GOAL #1   Title  Pt will be independent with HEP to continue to work on strength, balance and functional mobility gains at home.    Baseline  Currently does not have home program and limited mobility at home.    Time  4    Period  Weeks    Status  New    Target Date  06/25/19      PT SHORT TERM GOAL #2   Title  Pt will increase 30 sec sit to stand from 2 reps to 4  or more for improved functional strength and mobility.    Baseline  4 sit to stands in 30 sec on 06/30/19    Time  4    Period  Weeks    Status  Achieved    Target Date  06/25/19      PT SHORT TERM GOAL #3   Title  Pt will decrease TUG from 28 sec to <22 sec for improved balance and decreased fall risk.    Baseline  29 sec and 25 sec with average of 27 sec on 06/30/19    Time  4    Period  Weeks    Status  On-going    Target Date  06/25/19      PT SHORT TERM GOAL #4   Title  Pt will ambulate >250' with FWW mod I for improved household and short community distances.    Baseline  115' mod I with FWW.    Time  4    Period  Weeks    Status  On-going    Target Date  06/25/19        PT Long Term Goals - 05/26/19 1402      PT LONG TERM GOAL #1   Title  Pt will increase 30 sec sit to stand to 6 or more for further improvement in functional strength and mobility.    Baseline  2 on 05/26/19    Time  8    Period  Weeks    Status  New    Target Date  07/25/19      PT LONG TERM GOAL #2   Title  Pt will increase gait speed from 0.60m/s to >0.6 m/s for improved gait safety in house and community.    Baseline  gait speed=0.30m/s on 05/26/19    Time  8    Period  Weeks    Status  New    Target Date  07/25/19      PT LONG TERM GOAL #3   Title  Pt will decrease TUG to <18 sec for improved balance and decreased fall risk.    Baseline  28 sec on 05/26/19    Time  8    Period  Weeks    Status  New    Target Date  07/25/19      PT LONG TERM GOAL #4   Title  Pt will ambulate >400' on varied surfaces with SPC  mod I for improved gait safety.    Baseline  14' with FWW supervision on level surfaces on 05/26/19    Time  8    Period  Weeks    Status  New    Target Date  07/25/19      PT LONG TERM GOAL #5   Title  Pt will report <6/10 pain in knees with functional mobility in home for improved function.    Baseline  8/10 pain in knees with movement on 05/26/19    Time  8    Period   Weeks    Status  New    Target Date  07/25/19            Plan - 07/02/19 1427    Clinical Impression Statement  Pt was able to increase gait distance some today needing a couple standing rest breaks throughout. PT able to initiate standing balance exercises for HEP today.    Personal Factors and Comorbidities  Comorbidity 3+    Examination-Activity Limitations  Locomotion Level;Transfers;Squat;Stairs;Stand    Examination-Participation Restrictions  Community Activity;Driving;Shop;Meal Prep    Stability/Clinical Decision Making  Evolving/Moderate complexity    Rehab Potential  Good    PT Frequency  1x / week   followed by 2x/week for 4 weeks   PT Duration  3 weeks   followed by 2x/week for 4 weeks   PT Treatment/Interventions  ADLs/Self Care Home Management;Aquatic Therapy;DME Instruction;Gait training;Stair training;Functional mobility training;Therapeutic activities;Therapeutic exercise;Balance training;Patient/family education;Neuromuscular re-education;Energy conservation;Vestibular;Passive range of motion    PT Next Visit Plan  Next visit gait training, balance, and strengthening breaking up activities.    PT Home Exercise Plan  Access Code: sit to stands plus [email or web address]    Consulted and Agree with Plan of Care  Patient       Patient will benefit from skilled therapeutic intervention in order to improve the following deficits and impairments:  Abnormal gait, Cardiopulmonary status limiting activity, Decreased activity tolerance, Decreased balance, Decreased strength, Decreased mobility, Decreased knowledge of use of DME, Impaired flexibility, Decreased endurance, Difficulty walking, Postural dysfunction, Decreased range of motion  Visit Diagnosis: Muscle weakness (generalized)  Difficulty in walking, not elsewhere classified     Problem List Patient Active Problem List   Diagnosis Date Noted  . Dyspnea   . Knee pain   . Rhabdomyolysis due to statin therapy   . Demand  ischemia ([place])   . Sepsis ([place]) 05/13/2019  . NSTEMI (non-ST elevated myocardial infarction) ([place]) 05/13/2019  . Coronary artery disease 02/12/2019  . Hyperlipidemia 02/12/2019  . Unstable angina ([place])   . Near syncope 12/14/2018  . Chest tightness 12/14/2018  . Prolonged Q-T interval on ECG   . Acute gastroenteritis 08/02/2012  . Dehydration 08/02/2012  . AKI (acute kidney injury) ([place]) 08/02/2012  . Hypokalemia 08/02/2012  . Diabetes mellitus ([place]) 08/02/2012  . HTN (hypertension) 08/02/2012  . Tobacco abuse 08/02/2012  . ALLERGIC RHINITIS WITH CONJUNCTIVITIS 10/21/2010  . ROTATOR CUFF SYNDROME, RIGHT 10/21/2010  . SEBACEOUS CYST, INFECTED 01/31/2010  . GUAIAC POSITIVE STOOL 10/05/2009  . TOBACCO ABUSE 09/21/2009  . RECTAL BLEEDING, HX OF 09/21/2009  . Nonspecific (abnormal) findings on radiological and other examination of body structure 05/19/2009  . RIGHT LOWER LUNG FIELD CRACKLES 05/19/2009  . Diabetes ([place])  02/12/2009  . Pain in limb 02/12/2009  . DENTAL CARIES 04/21/2008  . HYPERTENSION 05/01/2007    Electa Sniff, PT, DPT, NCS 07/02/2019, 2:29 PM  [facility] [address] [place], [place], [address] Phone: [phone]   Fax:  [phone]  Name: Krista [name] MRN: [id number] Date of Birth: [birth year]

## 2019-07-02 NOTE — Patient Instructions (Addendum)
Access Code: [email or web address]  URL: [email or web address]  Date: 07/02/2019  Prepared by: Cherly Anderson   Exercises Seated Long Arc Quad - 10 reps - 2-3 sets - 2x daily - 6x weekly Seated Heel Toe Raises - 10 reps - 3 sets - 2x daily - 6x weekly Standing Marching - 10 reps - 1-2 sets - 2x daily - 6x weekly Standing Hip Abduction - 10 reps - 1-2 sets - 2x daily - 6x weekly Romberg Stance - 3 reps - 1 sets - 30 hold - 2x daily - 6x weekly Seated Hip Abduction with Resistance - 10 reps - 3 sets - 1x daily                            - 7x weekly Seated Hip Adduction Squeeze with Ball - 10 reps - 3 sets - 1x daily - 7x weekly Clamshell - 10 reps - 2 sets - 1x daily - 5x weekly Wide Tandem Stance on Foam Pad with Eyes Open - 2 reps - 1 sets - 30 sec hold - 2x daily - 7x weekly Standing Balance with Eyes Closed on Foam - 3 reps - 1 sets - 2x daily - 7x weekly Standing head turns - 10 reps - 1 sets - 2x daily - 7x weekly

## 2019-07-03 ENCOUNTER — Ambulatory Visit: Payer: Medicaid Other | Admitting: Occupational Therapy

## 2019-07-03 DIAGNOSIS — M25611 Stiffness of right shoulder, not elsewhere classified: Secondary | ICD-10-CM

## 2019-07-03 DIAGNOSIS — R2681 Unsteadiness on feet: Secondary | ICD-10-CM

## 2019-07-03 DIAGNOSIS — M6281 Muscle weakness (generalized): Secondary | ICD-10-CM | POA: Diagnosis not present

## 2019-07-03 NOTE — Therapy (Signed)
[facility] [address] [place], [place], [address] Phone: [phone]   Fax:  [phone]  Occupational Therapy Treatment  Patient Details  Name: Krista [name] MRN: [id number] Date of Birth: 1960-08-28 Referring Provider (OT): Willey Blade (FNP)   Encounter Date: 07/03/2019  OT End of Session - 07/03/19 1314    Visit Number  3    Number of Visits  9    Date for OT Re-Evaluation  08/14/19    Authorization Type  Medicaid    Authorization Time Period  Approved 8 visits 06/19/19 - 08/13/19    Authorization - Visit Number  2    Authorization - Number of Visits  8    OT Start Time  1230    OT Stop Time  1310    OT Time Calculation (min)  40 min    Activity Tolerance  Patient tolerated treatment well    Behavior During Therapy  [facility] for tasks assessed/performed       Past Medical History:  Diagnosis Date  . Arthritis   . CAD (coronary artery disease)   . Depression   . Diabetes mellitus   . Hypertension   . NSTEMI (non-ST elevated myocardial infarction) [facility])     Past Surgical History:  Procedure Laterality Date  . ANKLE SURGERY     left ankle - was broken  . CORONARY STENT INTERVENTION N/A 12/16/2018   Procedure: CORONARY STENT INTERVENTION;  Surgeon: Leonie Man, MD;  Location: [facility];  Service: Cardiovascular;  Laterality: N/A;  LAD  . LEFT HEART CATH AND CORONARY ANGIOGRAPHY N/A 12/16/2018   Procedure: LEFT HEART CATH AND CORONARY ANGIOGRAPHY;  Surgeon: Leonie Man, MD;  Location: [facility];  Service: Cardiovascular;  Laterality: N/A;    There were no vitals filed for this visit.  Subjective Assessment - 07/03/19 1235    Subjective   I've started washing dishes and starting getting a snack by myself    Pertinent History  rhabdomyolysis. PMH: CAD, OA, DM, HTN, MI 12/13/18    Currently in Pain?  Yes    Pain Score  7     Pain Location  Arm   upper arms   Pain Orientation   Right;Left    Pain Descriptors / Indicators  Aching    Pain Type  Chronic pain    Pain Onset  More than a month ago    Pain Frequency  Constant    Aggravating Factors   rainy weather    Pain Relieving Factors  tylenol       Pt reports HEP's going well.  Standing for kitchen tasks: retrieving items from low and high cabinets, and out of refrigerator w/ cues for safety, fall prevention, and walker negotiation. Pt able to stand for 5 min w/o rest. Required 2 rest breaks. Pt then standing to fold clothes for 4 min w/o rest. Pt required 1 rest break to finish task. Pt standing at counter to retrieve cones Rt hand and reach outside BOS, then repeated to Lt side before sitting again.  UBE x 5 min, level 1 for UE endurance - pt required 3 rest breaks                      OT Short Term Goals - 07/03/19 1315      OT SHORT TERM GOAL #1   Title  Patient will complete HEP designed to improve BUE AROM without assistance  Baseline  No current exercise program    Time  4    Period  Weeks    Status  Achieved    Target Date  07/09/19      OT SHORT TERM GOAL #2   Title  Patient will ambulate in kitchen and prepare a simple cold snack and beverage  for herself without assistance    Baseline  Patient is currently reliant on her mother for all meals    Time  4    Period  Weeks    Status  On-going      OT SHORT TERM GOAL #3   Title  Patient will stand to complete simple housekeeping task for greater than 10 minutes without rest    Baseline  Unable to stand more than 10 min    Time  4    Period  Weeks    Status  On-going      OT SHORT TERM GOAL #4   Title  Patient will report pain no more than 4/10 in right shoulder with passive range of motion exercise    Baseline  7/10 at rest    Time  4    Period  Weeks    Status  On-going        OT Long Term Goals - 06/02/19 1747      OT LONG TERM GOAL #1   Title  Patient will complete a home activities program designed to improve  activity tolerance with daily life skills    Baseline  Patient reliant on elderly mother for ADL/IADL    Time  8    Period  Weeks    Status  New    Target Date  08/14/19      OT LONG TERM GOAL #2   Title  Patient will complete a HEP designed to improve grip strength and finger range of motion    Baseline  Not currently exercising UE's    Time  8    Period  Weeks    Status  New      OT LONG TERM GOAL #3   Title  Patient will complete all bathing, dressing, grooming at modified independent level    Baseline  Min assist    Time  8    Period  Weeks    Status  New      OT LONG TERM GOAL #4   Title  Patient will complete simple warm meal prep at least 2 items  for she and her parents without assistance    Baseline  Patient unable to cook currently    Time  8    Period  Weeks    Status  New      OT LONG TERM GOAL #5   Title  Patient will complete at least 15-20 min of simple housekeeping tasks without rest break    Baseline  Is not currently completing, reliant on mother    Time  8    Period  Weeks    Status  New      Long Term Additional Goals   Additional Long Term Goals  Yes      OT LONG TERM GOAL #6   Title  Patient will demonstrate a 5 lb increase in grip strength in BUE  to aide with opening or carrying items    Baseline  R- 25LB, L- 15 LB    Time  8    Period  Weeks    Status  New  Plan - 07/03/19 1442    Clinical Impression Statement  Pt has met STG #1 and progressing towards remaining goals. Pt still requires frequent rest breaks, but gradually improving endurance    Occupational performance deficits (Please refer to evaluation for details):  ADL's;IADL's;Rest and Sleep;Work;Leisure    Body Structure / Function / Physical Skills  ADL;Coordination;Endurance;GMC;UE functional use;Balance;Decreased knowledge of precautions;Fascial restriction;Pain;IADL;Flexibility;Decreased knowledge of use of DME;Body mechanics;Cardiopulmonary status limiting  activity;Dexterity;FMC;Strength;Tone;ROM;Mobility    Rehab Potential  Good    OT Frequency  1x / week    OT Duration  8 weeks    OT Treatment/Interventions  Self-care/ADL training;Iontophoresis;Therapeutic exercise;Electrical Stimulation;Patient/family education;Splinting;Neuromuscular education;Moist Heat;Aquatic Therapy;Fluidtherapy;Functional Mobility Training;Energy conservation;Therapeutic activities;Balance training;Manual Therapy;DME and/or AE instruction;Ultrasound;Cryotherapy    Plan  continue UE ROM/strength, standing tolerance for IADLS    Consulted and Agree with Plan of Care  Patient       Patient will benefit from skilled therapeutic intervention in order to improve the following deficits and impairments:   Body Structure / Function / Physical Skills: ADL, Coordination, Endurance, GMC, UE functional use, Balance, Decreased knowledge of precautions, Fascial restriction, Pain, IADL, Flexibility, Decreased knowledge of use of DME, Body mechanics, Cardiopulmonary status limiting activity, Dexterity, FMC, Strength, Tone, ROM, Mobility       Visit Diagnosis: Muscle weakness (generalized)  Unsteadiness on feet  Stiffness of right shoulder, not elsewhere classified    Problem List Patient Active Problem List   Diagnosis Date Noted  . Dyspnea   . Knee pain   . Rhabdomyolysis due to statin therapy   . Demand ischemia ([place])   . Sepsis ([place]) 05/13/2019  . NSTEMI (non-ST elevated myocardial infarction) ([place]) 05/13/2019  . Coronary artery disease 02/12/2019  . Hyperlipidemia 02/12/2019  . Unstable angina ([place])   . Near syncope 12/14/2018  . Chest tightness 12/14/2018  . Prolonged Q-T interval on ECG   . Acute gastroenteritis 08/02/2012  . Dehydration 08/02/2012  . AKI (acute kidney injury) ([place]) 08/02/2012  . Hypokalemia 08/02/2012  . Diabetes mellitus ([place]) 08/02/2012  . HTN (hypertension) 08/02/2012  . Tobacco abuse 08/02/2012  . ALLERGIC RHINITIS WITH CONJUNCTIVITIS  10/21/2010  . ROTATOR CUFF SYNDROME, RIGHT 10/21/2010  . SEBACEOUS CYST, INFECTED 01/31/2010  . GUAIAC POSITIVE STOOL 10/05/2009  . TOBACCO ABUSE 09/21/2009  . RECTAL BLEEDING, HX OF 09/21/2009  . Nonspecific (abnormal) findings on radiological and other examination of body structure 05/19/2009  . RIGHT LOWER LUNG FIELD CRACKLES 05/19/2009  . Diabetes ([place]) 02/12/2009  . Pain in limb 02/12/2009  . DENTAL CARIES 04/21/2008  . HYPERTENSION 05/01/2007    Carey Bullocks, OTR/L 07/03/2019, 4:56 PM  [facility] [address] [place], [place], [address] Phone: [phone]   Fax:  [phone]  Name: Krista [name] MRN: [id number] Date of Birth: 08/02/1961

## 2019-07-04 ENCOUNTER — Encounter: Payer: Self-pay | Admitting: Internal Medicine

## 2019-07-07 ENCOUNTER — Ambulatory Visit: Payer: Medicaid Other

## 2019-07-07 ENCOUNTER — Other Ambulatory Visit: Payer: Self-pay

## 2019-07-07 DIAGNOSIS — R262 Difficulty in walking, not elsewhere classified: Secondary | ICD-10-CM

## 2019-07-07 DIAGNOSIS — M6281 Muscle weakness (generalized): Secondary | ICD-10-CM | POA: Diagnosis not present

## 2019-07-07 NOTE — Therapy (Signed)
[facility] [address] [place], [place], [address] Phone: [phone]   Fax:  [phone]  Physical Therapy Treatment  Patient Details  Name: Krista [name] MRN: [id number] Date of Birth: 02-08-1961 Referring Provider (PT): Willey Blade   Encounter Date: 07/07/2019  PT End of Session - 07/07/19 1248    Visit Number  6    Authorization Time Period  8 visits 06/29/1212/20    Authorization - Visit Number  5    Authorization - Number of Visits  10    PT Start Time  1244   pt arrived late   PT Stop Time  1314    PT Time Calculation (min)  30 min       Past Medical History:  Diagnosis Date  . Arthritis   . CAD (coronary artery disease)   . Depression   . Diabetes mellitus   . Hypertension   . NSTEMI (non-ST elevated myocardial infarction) [facility])     Past Surgical History:  Procedure Laterality Date  . ANKLE SURGERY     left ankle - was broken  . CORONARY STENT INTERVENTION N/A 12/16/2018   Procedure: CORONARY STENT INTERVENTION;  Surgeon: Leonie Man, MD;  Location: [facility];  Service: Cardiovascular;  Laterality: N/A;  LAD  . LEFT HEART CATH AND CORONARY ANGIOGRAPHY N/A 12/16/2018   Procedure: LEFT HEART CATH AND CORONARY ANGIOGRAPHY;  Surgeon: Leonie Man, MD;  Location: [place];  Service: Cardiovascular;  Laterality: N/A;    There were no vitals filed for this visit.  Subjective Assessment - 07/07/19 1247    Subjective  Pt reports that she is just tired as got some bad news about cousin being killed and had not been able to get back to sleep.    Patient Stated Goals  Pt would like to get better to be able to move easier with less pain and more energy.    Currently in Pain?  Yes    Pain Score  8     Pain Location  Knee   and under arms   Pain Orientation  Right;Left    Pain Descriptors / Indicators  Aching    Pain Type  Chronic pain    Pain Onset  More than a month ago     Pain Frequency  Constant                       [facility] Adult PT Treatment/Exercise - 07/07/19 1248      Ambulation/Gait   Ambulation/Gait  Yes    Ambulation/Gait Assistance  6: Modified independent (Device/Increase time)    Ambulation Distance (Feet)  80 Feet   in to and out of clinic with FWW   Assistive device  Rolling walker    Gait Pattern  Step-through pattern;Decreased step length - right;Decreased step length - left    Ambulation Surface  Level;Indoor      Neuro Re-ed    Neuro Re-ed Details   Along counter: side stepping with UE support 6' x 4, marching with 1 UE support 6' x 4, tandem gait 6' x 4 with CGA, backwards gait and long steps forward 6' x 2 each. Standing on airex without UE support x 30 sec, marching on airex with UE support x 10. Frequent seated rest breaks throughout. Increased sway on foam without UE suport with CGA for safety.      Exercises   Exercises  Other Exercises  Other Exercises   Seated LAQ 10 x 2 bilateral, hip flexion 10 x 2,  bilateral hip abd and adduction x 20 with light manual resistance. Pt needed to rest between sets             PT Education - 07/07/19 1313    Education Details  Instructed to add marching gait and side stepping to HEP versus standing in place    Person(s) Educated  Patient    Methods  Explanation    Comprehension  Verbalized understanding;Returned demonstration       PT Short Term Goals - 06/30/19 1257      PT SHORT TERM GOAL #1   Title  Pt will be independent with HEP to continue to work on strength, balance and functional mobility gains at home.    Baseline  Currently does not have home program and limited mobility at home.    Time  4    Period  Weeks    Status  New    Target Date  06/25/19      PT SHORT TERM GOAL #2   Title  Pt will increase 30 sec sit to stand from 2 reps to 4 or more for improved functional strength and mobility.    Baseline  4 sit to stands in 30 sec on 06/30/19    Time   4    Period  Weeks    Status  Achieved    Target Date  06/25/19      PT SHORT TERM GOAL #3   Title  Pt will decrease TUG from 28 sec to <22 sec for improved balance and decreased fall risk.    Baseline  29 sec and 25 sec with average of 27 sec on 06/30/19    Time  4    Period  Weeks    Status  On-going    Target Date  06/25/19      PT SHORT TERM GOAL #4   Title  Pt will ambulate >250' with FWW mod I for improved household and short community distances.    Baseline  115' mod I with FWW.    Time  4    Period  Weeks    Status  On-going    Target Date  06/25/19        PT Long Term Goals - 05/26/19 1402      PT LONG TERM GOAL #1   Title  Pt will increase 30 sec sit to stand to 6 or more for further improvement in functional strength and mobility.    Baseline  2 on 05/26/19    Time  8    Period  Weeks    Status  New    Target Date  07/25/19      PT LONG TERM GOAL #2   Title  Pt will increase gait speed from 0.58m/s to >0.6 m/s for improved gait safety in house and community.    Baseline  gait speed=0.6m/s on 05/26/19    Time  8    Period  Weeks    Status  New    Target Date  07/25/19      PT LONG TERM GOAL #3   Title  Pt will decrease TUG to <18 sec for improved balance and decreased fall risk.    Baseline  28 sec on 05/26/19    Time  8    Period  Weeks    Status  New    Target Date  07/25/19  PT LONG TERM GOAL #4   Title  Pt will ambulate >400' on varied surfaces with SPC  mod I for improved gait safety.    Baseline  39' with FWW supervision on level surfaces on 05/26/19    Time  8    Period  Weeks    Status  New    Target Date  07/25/19      PT LONG TERM GOAL #5   Title  Pt will report <6/10 pain in knees with functional mobility in home for improved function.    Baseline  8/10 pain in knees with movement on 05/26/19    Time  8    Period  Weeks    Status  New    Target Date  07/25/19            Plan - 07/07/19 1600    Clinical Impression  Statement  Pt was able to progress to more standing activities today but still needed frequent rest breaks.    Personal Factors and Comorbidities  Comorbidity 3+    Examination-Activity Limitations  Locomotion Level;Transfers;Squat;Stairs;Stand    Examination-Participation Restrictions  Community Activity;Driving;Shop;Meal Prep    Stability/Clinical Decision Making  Evolving/Moderate complexity    Rehab Potential  Good    PT Frequency  1x / week   followed by 2x/week for 4 weeks   PT Duration  3 weeks   followed by 2x/week for 4 weeks   PT Treatment/Interventions  ADLs/Self Care Home Management;Aquatic Therapy;DME Instruction;Gait training;Stair training;Functional mobility training;Therapeutic activities;Therapeutic exercise;Balance training;Patient/family education;Neuromuscular re-education;Energy conservation;Vestibular;Passive range of motion    PT Next Visit Plan  Next visit gait training, balance, and strengthening breaking up activities.    PT Home Exercise Plan  Access Code: sit to stands plus [email or web address]    Consulted and Agree with Plan of Care  Patient       Patient will benefit from skilled therapeutic intervention in order to improve the following deficits and impairments:  Abnormal gait, Cardiopulmonary status limiting activity, Decreased activity tolerance, Decreased balance, Decreased strength, Decreased mobility, Decreased knowledge of use of DME, Impaired flexibility, Decreased endurance, Difficulty walking, Postural dysfunction, Decreased range of motion  Visit Diagnosis: Muscle weakness (generalized)  Difficulty in walking, not elsewhere classified     Problem List Patient Active Problem List   Diagnosis Date Noted  . Dyspnea   . Knee pain   . Rhabdomyolysis due to statin therapy   . Demand ischemia ([place])   . Sepsis ([place]) 05/13/2019  . NSTEMI (non-ST elevated myocardial infarction) ([facility]) 05/13/2019  . Coronary artery disease 02/12/2019  . Hyperlipidemia  02/12/2019  . Unstable angina ([place])   . Near syncope 12/14/2018  . Chest tightness 12/14/2018  . Prolonged Q-T interval on ECG   . Acute gastroenteritis 08/02/2012  . Dehydration 08/02/2012  . AKI (acute kidney injury) ([place]) 08/02/2012  . Hypokalemia 08/02/2012  . Diabetes mellitus ([place]) 08/02/2012  . HTN (hypertension) 08/02/2012  . Tobacco abuse 08/02/2012  . ALLERGIC RHINITIS WITH CONJUNCTIVITIS 10/21/2010  . ROTATOR CUFF SYNDROME, RIGHT 10/21/2010  . SEBACEOUS CYST, INFECTED 01/31/2010  . GUAIAC POSITIVE STOOL 10/05/2009  . TOBACCO ABUSE 09/21/2009  . RECTAL BLEEDING, HX OF 09/21/2009  . Nonspecific (abnormal) findings on radiological and other examination of body structure 05/19/2009  . RIGHT LOWER LUNG FIELD CRACKLES 05/19/2009  . Diabetes ([place]) 02/12/2009  . Pain in limb 02/12/2009  . DENTAL CARIES 04/21/2008  . HYPERTENSION 05/01/2007    Electa Sniff, PT, DPT, NCS 07/07/2019, 4:01 PM  [facility] [address] [place], [place], [address] Phone: [phone]   Fax:  [phone]  Name: ARDYN EPLER MRN: [id number] Date of Birth: [birth year]

## 2019-07-08 ENCOUNTER — Ambulatory Visit: Payer: Medicaid Other | Admitting: Occupational Therapy

## 2019-07-08 DIAGNOSIS — M6281 Muscle weakness (generalized): Secondary | ICD-10-CM | POA: Diagnosis not present

## 2019-07-08 DIAGNOSIS — M25511 Pain in right shoulder: Secondary | ICD-10-CM

## 2019-07-08 DIAGNOSIS — M25621 Stiffness of right elbow, not elsewhere classified: Secondary | ICD-10-CM

## 2019-07-08 DIAGNOSIS — M79602 Pain in left arm: Secondary | ICD-10-CM

## 2019-07-08 DIAGNOSIS — R2681 Unsteadiness on feet: Secondary | ICD-10-CM

## 2019-07-08 DIAGNOSIS — M25611 Stiffness of right shoulder, not elsewhere classified: Secondary | ICD-10-CM

## 2019-07-08 DIAGNOSIS — G8929 Other chronic pain: Secondary | ICD-10-CM

## 2019-07-08 NOTE — Therapy (Signed)
[facility] [address] [place], [place], [address] Phone: [phone]   Fax:  [phone]  Occupational Therapy Treatment  Patient Details  Name: Krista [name] MRN: [id number] Date of Birth: 03/27/1961 Referring Provider (OT): Willey Blade (FNP)   Encounter Date: 07/08/2019  OT End of Session - 07/08/19 1339    Visit Number  4    Number of Visits  9    Date for OT Re-Evaluation  08/14/19    Authorization Type  Medicaid    Authorization Time Period  Approved 8 visits 06/19/19 - 08/13/19    Authorization - Visit Number  3    Authorization - Number of Visits  8    OT Start Time  [id number]    OT Stop Time  1400    OT Time Calculation (min)  45 min    Activity Tolerance  Patient limited by fatigue    Behavior During Therapy  [facility] for tasks assessed/performed       Past Medical History:  Diagnosis Date  . Arthritis   . CAD (coronary artery disease)   . Depression   . Diabetes mellitus   . Hypertension   . NSTEMI (non-ST elevated myocardial infarction) [facility])     Past Surgical History:  Procedure Laterality Date  . ANKLE SURGERY     left ankle - was broken  . CORONARY STENT INTERVENTION N/A 12/16/2018   Procedure: CORONARY STENT INTERVENTION;  Surgeon: Leonie Man, MD;  Location: [facility];  Service: Cardiovascular;  Laterality: N/A;  LAD  . LEFT HEART CATH AND CORONARY ANGIOGRAPHY N/A 12/16/2018   Procedure: LEFT HEART CATH AND CORONARY ANGIOGRAPHY;  Surgeon: Leonie Man, MD;  Location: [facility];  Service: Cardiovascular;  Laterality: N/A;    There were no vitals filed for this visit.  Subjective Assessment - 07/08/19 1320    Subjective   I almost fell this morning but caught myself.    Pertinent History  Presented to ED 05/13/19 after fall w/ sepsis and ARF, developed rhabdomyolysis. PMH: CAD, OA, DM, HTN, MI 12/13/18    Currently in Pain?  Yes    Pain Score  8     Pain Location  --    arms and legs   Pain Orientation  Right;Left    Pain Descriptors / Indicators  Aching    Pain Type  Chronic pain    Pain Onset  More than a month ago    Pain Frequency  Constant    Aggravating Factors   rainy weather    Pain Relieving Factors  tylenol       Seated: performing cane ex's in shoulder flexion, shoulder abduction, ER/IR x 10 reps each w/ rest breaks after 5 reps.  Gripper set at level 1 to pick up blocks - 1/2 amount w/ Rt hand, then 1/2 w/ Lt hand.   Standing to retrieve milk out of refrigerator and replace. Standing to place clothes in washing machine, moving from washing machine to dryer, and then retrieving from dryer before needing rest break.   UBE x 5 min w/ 2 rest breaks.                       OT Short Term Goals - 07/03/19 1315      OT SHORT TERM GOAL #1   Title  Patient will complete HEP designed to improve BUE AROM without assistance    Baseline  No current  exercise program    Time  4    Period  Weeks    Status  Achieved    Target Date  07/09/19      OT SHORT TERM GOAL #2   Title  Patient will ambulate in kitchen and prepare a simple cold snack and beverage  for herself without assistance    Baseline  Patient is currently reliant on her mother for all meals    Time  4    Period  Weeks    Status  On-going      OT SHORT TERM GOAL #3   Title  Patient will stand to complete simple housekeeping task for greater than 10 minutes without rest    Baseline  Unable to stand more than 10 min    Time  4    Period  Weeks    Status  On-going      OT SHORT TERM GOAL #4   Title  Patient will report pain no more than 4/10 in right shoulder with passive range of motion exercise    Baseline  7/10 at rest    Time  4    Period  Weeks    Status  On-going        OT Long Term Goals - 06/02/19 1747      OT LONG TERM GOAL #1   Title  Patient will complete a home activities program designed to improve activity tolerance with daily life skills     Baseline  Patient reliant on elderly mother for ADL/IADL    Time  8    Period  Weeks    Status  New    Target Date  08/14/19      OT LONG TERM GOAL #2   Title  Patient will complete a HEP designed to improve grip strength and finger range of motion    Baseline  Not currently exercising UE's    Time  8    Period  Weeks    Status  New      OT LONG TERM GOAL #3   Title  Patient will complete all bathing, dressing, grooming at modified independent level    Baseline  Min assist    Time  8    Period  Weeks    Status  New      OT LONG TERM GOAL #4   Title  Patient will complete simple warm meal prep at least 2 items  for she and her parents without assistance    Baseline  Patient unable to cook currently    Time  8    Period  Weeks    Status  New      OT LONG TERM GOAL #5   Title  Patient will complete at least 15-20 min of simple housekeeping tasks without rest break    Baseline  Is not currently completing, reliant on mother    Time  8    Period  Weeks    Status  New      Long Term Additional Goals   Additional Long Term Goals  Yes      OT LONG TERM GOAL #6   Title  Patient will demonstrate a 5 lb increase in grip strength in BUE  to aide with opening or carrying items    Baseline  R- 25LB, L- 15 LB    Time  8    Period  Weeks    Status  New  Plan - 07/08/19 1346    Clinical Impression Statement  Pt gradually improving endurance w/ guidance on how to do.    Occupational performance deficits (Please refer to evaluation for details):  ADL's;IADL's;Rest and Sleep;Work;Leisure    Body Structure / Function / Physical Skills  ADL;Coordination;Endurance;GMC;UE functional use;Balance;Decreased knowledge of precautions;Fascial restriction;Pain;IADL;Flexibility;Decreased knowledge of use of DME;Body mechanics;Cardiopulmonary status limiting activity;Dexterity;FMC;Strength;Tone;ROM;Mobility    Rehab Potential  Good    OT Frequency  1x / week    OT Duration  8  weeks    OT Treatment/Interventions  Self-care/ADL training;Iontophoresis;Therapeutic exercise;Electrical Stimulation;Patient/family education;Splinting;Neuromuscular education;Moist Heat;Aquatic Therapy;Fluidtherapy;Functional Mobility Training;Energy conservation;Therapeutic activities;Balance training;Manual Therapy;DME and/or AE instruction;Ultrasound;Cryotherapy    Plan  continue UE ROM/strength, standing tolerance for IADLS    Consulted and Agree with Plan of Care  Patient       Patient will benefit from skilled therapeutic intervention in order to improve the following deficits and impairments:   Body Structure / Function / Physical Skills: ADL, Coordination, Endurance, GMC, UE functional use, Balance, Decreased knowledge of precautions, Fascial restriction, Pain, IADL, Flexibility, Decreased knowledge of use of DME, Body mechanics, Cardiopulmonary status limiting activity, Dexterity, FMC, Strength, Tone, ROM, Mobility       Visit Diagnosis: Muscle weakness (generalized)  Unsteadiness on feet  Stiffness of right shoulder, not elsewhere classified  Stiffness of right elbow, not elsewhere classified  Chronic right shoulder pain  Pain in left arm    Problem List Patient Active Problem List   Diagnosis Date Noted  . Dyspnea   . Knee pain   . Rhabdomyolysis due to statin therapy   . Demand ischemia ([place])   . Sepsis ([place]) 05/13/2019  . NSTEMI (non-ST elevated myocardial infarction) ([place]) 05/13/2019  . Coronary artery disease 02/12/2019  . Hyperlipidemia 02/12/2019  . Unstable angina ([place])   . Near syncope 12/14/2018  . Chest tightness 12/14/2018  . Prolonged Q-T interval on ECG   . Acute gastroenteritis 08/02/2012  . Dehydration 08/02/2012  . AKI (acute kidney injury) ([place]) 08/02/2012  . Hypokalemia 08/02/2012  . Diabetes mellitus ([place]) 08/02/2012  . HTN (hypertension) 08/02/2012  . Tobacco abuse 08/02/2012  . ALLERGIC RHINITIS WITH CONJUNCTIVITIS 10/21/2010  .  ROTATOR CUFF SYNDROME, RIGHT 10/21/2010  . SEBACEOUS CYST, INFECTED 01/31/2010  . GUAIAC POSITIVE STOOL 10/05/2009  . TOBACCO ABUSE 09/21/2009  . RECTAL BLEEDING, HX OF 09/21/2009  . Nonspecific (abnormal) findings on radiological and other examination of body structure 05/19/2009  . RIGHT LOWER LUNG FIELD CRACKLES 05/19/2009  . Diabetes ([place]) 02/12/2009  . Pain in limb 02/12/2009  . DENTAL CARIES 04/21/2008  . HYPERTENSION 05/01/2007    Carey Bullocks, OTR/L 07/08/2019, 2:12 PM  [facility] [address] [place], [place], [address] Phone: [phone]   Fax:  [phone]  Name: DEVORA VENCES MRN: [id number] Date of Birth: [birth year]

## 2019-07-09 ENCOUNTER — Other Ambulatory Visit: Payer: Self-pay

## 2019-07-09 ENCOUNTER — Ambulatory Visit: Payer: Medicaid Other

## 2019-07-09 DIAGNOSIS — M6281 Muscle weakness (generalized): Secondary | ICD-10-CM | POA: Diagnosis not present

## 2019-07-09 DIAGNOSIS — R262 Difficulty in walking, not elsewhere classified: Secondary | ICD-10-CM

## 2019-07-09 NOTE — Therapy (Signed)
[facility] [address] [place], [place], [address] Phone: [phone]   Fax:  [phone]  Physical Therapy Treatment  Patient Details  Name: Krista [name] MRN: [id number] Date of Birth: 09-Nov-1960 Referring Provider (PT): Willey Blade   Encounter Date: 07/09/2019  PT End of Session - 07/09/19 1240    Visit Number  7    Number of Visits  12    Authorization Time Period  8 visits 06/29/1212/20    Authorization - Visit Number  6    Authorization - Number of Visits  10    PT Start Time  [id number]    PT Stop Time  1314    PT Time Calculation (min)  38 min       Past Medical History:  Diagnosis Date  . Arthritis   . CAD (coronary artery disease)   . Depression   . Diabetes mellitus   . Hypertension   . NSTEMI (non-ST elevated myocardial infarction) [facility])     Past Surgical History:  Procedure Laterality Date  . ANKLE SURGERY     left ankle - was broken  . CORONARY STENT INTERVENTION N/A 12/16/2018   Procedure: CORONARY STENT INTERVENTION;  Surgeon: Leonie Man, MD;  Location: [facility];  Service: Cardiovascular;  Laterality: N/A;  LAD  . LEFT HEART CATH AND CORONARY ANGIOGRAPHY N/A 12/16/2018   Procedure: LEFT HEART CATH AND CORONARY ANGIOGRAPHY;  Surgeon: Leonie Man, MD;  Location: [facility];  Service: Cardiovascular;  Laterality: N/A;    There were no vitals filed for this visit.  Subjective Assessment - 07/09/19 1239    Subjective  Pt reports she is doing ok.    Patient Stated Goals  Pt would like to get better to be able to move easier with less pain and more energy.    Currently in Pain?  Yes    Pain Score  7     Pain Location  Knee    Pain Orientation  Right;Left    Pain Descriptors / Indicators  Aching    Pain Type  Chronic pain    Pain Onset  More than a month ago    Pain Frequency  Constant                       [facility] Adult PT Treatment/Exercise -  07/09/19 1242      Ambulation/Gait   Ambulation/Gait  Yes    Ambulation/Gait Assistance  6: Modified independent (Device/Increase time)    Ambulation Distance (Feet)  200 Feet    Assistive device  Rolling walker    Gait Pattern  Step-through pattern    Ambulation Surface  Level;Indoor    Gait Comments  Pt needed 1 standing rest break due to knee pain with gait.      Neuro Re-ed    Neuro Re-ed Details   Rockerboard positioned ant/post trying to maintain level 30 sec x 3 with occasional UE support then repositioned board side to side and maintaining level 30 sec x 2. Pt has increased sway in ant/post position with more frequent UE need.      Exercises   Exercises  Knee/Hip;Other Exercises    Other Exercises   Sit to stand from mat 5 x 2, marching at walker x 10 bilateral.      Knee/Hip Exercises: Aerobic   Nustep   level 3 x 6 min. Pt able to complete with only brief break at  5 min.             PT Education - 07/09/19 2039    Education Details  Pt to continue with current HEP    Person(s) Educated  Patient    Methods  Explanation    Comprehension  Verbalized understanding       PT Short Term Goals - 06/30/19 1257      PT SHORT TERM GOAL #1   Title  Pt will be independent with HEP to continue to work on strength, balance and functional mobility gains at home.    Baseline  Currently does not have home program and limited mobility at home.    Time  4    Period  Weeks    Status  New    Target Date  06/25/19      PT SHORT TERM GOAL #2   Title  Pt will increase 30 sec sit to stand from 2 reps to 4 or more for improved functional strength and mobility.    Baseline  4 sit to stands in 30 sec on 06/30/19    Time  4    Period  Weeks    Status  Achieved    Target Date  06/25/19      PT SHORT TERM GOAL #3   Title  Pt will decrease TUG from 28 sec to <22 sec for improved balance and decreased fall risk.    Baseline  29 sec and 25 sec with average of 27 sec on 06/30/19     Time  4    Period  Weeks    Status  On-going    Target Date  06/25/19      PT SHORT TERM GOAL #4   Title  Pt will ambulate >250' with FWW mod I for improved household and short community distances.    Baseline  115' mod I with FWW.    Time  4    Period  Weeks    Status  On-going    Target Date  06/25/19        PT Long Term Goals - 05/26/19 1402      PT LONG TERM GOAL #1   Title  Pt will increase 30 sec sit to stand to 6 or more for further improvement in functional strength and mobility.    Baseline  2 on 05/26/19    Time  8    Period  Weeks    Status  New    Target Date  07/25/19      PT LONG TERM GOAL #2   Title  Pt will increase gait speed from 0.62m/s to >0.6 m/s for improved gait safety in house and community.    Baseline  gait speed=0.67m/s on 05/26/19    Time  8    Period  Weeks    Status  New    Target Date  07/25/19      PT LONG TERM GOAL #3   Title  Pt will decrease TUG to <18 sec for improved balance and decreased fall risk.    Baseline  28 sec on 05/26/19    Time  8    Period  Weeks    Status  New    Target Date  07/25/19      PT LONG TERM GOAL #4   Title  Pt will ambulate >400' on varied surfaces with SPC  mod I for improved gait safety.    Baseline  8' with FWW supervision on level  surfaces on 05/26/19    Time  8    Period  Weeks    Status  New    Target Date  07/25/19      PT LONG TERM GOAL #5   Title  Pt will report <6/10 pain in knees with functional mobility in home for improved function.    Baseline  8/10 pain in knees with movement on 05/26/19    Time  8    Period  Weeks    Status  New    Target Date  07/25/19            Plan - 07/09/19 2041    Clinical Impression Statement  Pt continues to show improvement in activity tolerance. Was able to complete longer time on NuStep without a break today. Did well with standing exercises as long as broke up. Knee pain is most limiting factor.    Personal Factors and Comorbidities   Comorbidity 3+    Examination-Activity Limitations  Locomotion Level;Transfers;Squat;Stairs;Stand    Examination-Participation Restrictions  Community Activity;Driving;Shop;Meal Prep    Stability/Clinical Decision Making  Evolving/Moderate complexity    Rehab Potential  Good    PT Frequency  1x / week   followed by 2x/week for 4 weeks   PT Duration  3 weeks   followed by 2x/week for 4 weeks   PT Treatment/Interventions  ADLs/Self Care Home Management;Aquatic Therapy;DME Instruction;Gait training;Stair training;Functional mobility training;Therapeutic activities;Therapeutic exercise;Balance training;Patient/family education;Neuromuscular re-education;Energy conservation;Vestibular;Passive range of motion    PT Next Visit Plan  Next visit gait training, balance, and strengthening breaking up activities.    PT Home Exercise Plan  Access Code: sit to stands plus [email or web address]    Consulted and Agree with Plan of Care  Patient       Patient will benefit from skilled therapeutic intervention in order to improve the following deficits and impairments:  Abnormal gait, Cardiopulmonary status limiting activity, Decreased activity tolerance, Decreased balance, Decreased strength, Decreased mobility, Decreased knowledge of use of DME, Impaired flexibility, Decreased endurance, Difficulty walking, Postural dysfunction, Decreased range of motion  Visit Diagnosis: Muscle weakness (generalized)  Difficulty in walking, not elsewhere classified     Problem List Patient Active Problem List   Diagnosis Date Noted  . Dyspnea   . Knee pain   . Rhabdomyolysis due to statin therapy   . Demand ischemia ([name])   . Sepsis ([facility]) 05/13/2019  . NSTEMI (non-ST elevated myocardial infarction) ([facility]) 05/13/2019  . Coronary artery disease 02/12/2019  . Hyperlipidemia 02/12/2019  . Unstable angina ([facility])   . Near syncope 12/14/2018  . Chest tightness 12/14/2018  . Prolonged Q-T interval on ECG   . Acute gastroenteritis  08/02/2012  . Dehydration 08/02/2012  . AKI (acute kidney injury) ([facility]) 08/02/2012  . Hypokalemia 08/02/2012  . Diabetes mellitus ([facility]) 08/02/2012  . HTN (hypertension) 08/02/2012  . Tobacco abuse 08/02/2012  . ALLERGIC RHINITIS WITH CONJUNCTIVITIS 10/21/2010  . ROTATOR CUFF SYNDROME, RIGHT 10/21/2010  . SEBACEOUS CYST, INFECTED 01/31/2010  . GUAIAC POSITIVE STOOL 10/05/2009  . TOBACCO ABUSE 09/21/2009  . RECTAL BLEEDING, HX OF 09/21/2009  . Nonspecific (abnormal) findings on radiological and other examination of body structure 05/19/2009  . RIGHT LOWER LUNG FIELD CRACKLES 05/19/2009  . Diabetes ([facility]) 02/12/2009  . Pain in limb 02/12/2009  . DENTAL CARIES 04/21/2008  . HYPERTENSION 05/01/2007    Electa Sniff, PT, DPT, NCS 07/09/2019, 8:45 PM  [facility] [address] [place], [place], [address] Phone: [phone]  Fax:  [phone]  Name: Krista [name] MRN: [id number] Date of Birth: 03/12/1961

## 2019-07-14 ENCOUNTER — Other Ambulatory Visit: Payer: Self-pay

## 2019-07-14 ENCOUNTER — Ambulatory Visit: Payer: Medicaid Other

## 2019-07-14 DIAGNOSIS — M6281 Muscle weakness (generalized): Secondary | ICD-10-CM

## 2019-07-14 DIAGNOSIS — R262 Difficulty in walking, not elsewhere classified: Secondary | ICD-10-CM

## 2019-07-14 NOTE — Therapy (Signed)
[facility] [address] [place], [place], [address] Phone: [phone]   Fax:  [phone]  Physical Therapy Treatment  Patient Details  Name: Krista [name] MRN: [id number] Date of Birth: 1960/10/17 Referring Provider (PT): Willey Blade   Encounter Date: 07/14/2019  PT End of Session - 07/14/19 1237    Visit Number  8    Number of Visits  12    Authorization Time Period  8 visits 06/29/1212/20    Authorization - Visit Number  7    Authorization - Number of Visits  10    PT Start Time  1232    PT Stop Time  1310    PT Time Calculation (min)  38 min       Past Medical History:  Diagnosis Date  . Arthritis   . CAD (coronary artery disease)   . Depression   . Diabetes mellitus   . Hypertension   . NSTEMI (non-ST elevated myocardial infarction) [facility])     Past Surgical History:  Procedure Laterality Date  . ANKLE SURGERY     left ankle - was broken  . CORONARY STENT INTERVENTION N/A 12/16/2018   Procedure: CORONARY STENT INTERVENTION;  Surgeon: Leonie Man, MD;  Location: [facility];  Service: Cardiovascular;  Laterality: N/A;  LAD  . LEFT HEART CATH AND CORONARY ANGIOGRAPHY N/A 12/16/2018   Procedure: LEFT HEART CATH AND CORONARY ANGIOGRAPHY;  Surgeon: Leonie Man, MD;  Location: [facility];  Service: Cardiovascular;  Laterality: N/A;    There were no vitals filed for this visit.  Subjective Assessment - 07/14/19 1236    Subjective  Pt reports that knees really hurting today.    Patient Stated Goals  Pt would like to get better to be able to move easier with less pain and more energy.    Currently in Pain?  Yes    Pain Score  8     Pain Location  Knee    Pain Orientation  Left;Right    Pain Descriptors / Indicators  Aching    Pain Onset  More than a month ago    Pain Frequency  Constant                       [facility] Adult PT Treatment/Exercise - 07/14/19 1241       Ambulation/Gait   Ambulation/Gait  Yes    Ambulation/Gait Assistance  6: Modified independent (Device/Increase time)    Ambulation Distance (Feet)  230 Feet   Pt ambulated 80' x1 in to clinic and then 100' x 1 out    Assistive device  Rolling walker    Gait Pattern  Step-through pattern;Decreased step length - right;Decreased step length - left    Ambulation Surface  Level;Indoor    Gait Comments  Pt took 2 standing rest breaks during gait due to pain in knees. Pt has decreased cadence.      Neuro Re-ed    Neuro Re-ed Details   standing without UE support x 30 sec eyes closed, gait with head turns left/right x 10 close SBA. Increased sway with both mostly posterior with eyes closed.      Exercises   Exercises  Other Exercises    Other Exercises   Bridges 10 x 2 with verbal cues for form, Sidelying clam shell 10 x 2 each side. Standing at walker marching in place x 10 bilateral, hip extension at walker x 10 bilateral,  standing hip abd 10 x 2.  Verbal cues for form with exercises. Seated rest break between each standing exercise.             PT Education - 07/14/19 1533    Education Details  Pt to continue with current HEP    Person(s) Educated  Patient    Methods  Explanation    Comprehension  Verbalized understanding       PT Short Term Goals - 06/30/19 1257      PT SHORT TERM GOAL #1   Title  Pt will be independent with HEP to continue to work on strength, balance and functional mobility gains at home.    Baseline  Currently does not have home program and limited mobility at home.    Time  4    Period  Weeks    Status  New    Target Date  06/25/19      PT SHORT TERM GOAL #2   Title  Pt will increase 30 sec sit to stand from 2 reps to 4 or more for improved functional strength and mobility.    Baseline  4 sit to stands in 30 sec on 06/30/19    Time  4    Period  Weeks    Status  Achieved    Target Date  06/25/19      PT SHORT TERM GOAL #3   Title  Pt will  decrease TUG from 28 sec to <22 sec for improved balance and decreased fall risk.    Baseline  29 sec and 25 sec with average of 27 sec on 06/30/19    Time  4    Period  Weeks    Status  On-going    Target Date  06/25/19      PT SHORT TERM GOAL #4   Title  Pt will ambulate >250' with FWW mod I for improved household and short community distances.    Baseline  115' mod I with FWW.    Time  4    Period  Weeks    Status  On-going    Target Date  06/25/19        PT Long Term Goals - 05/26/19 1402      PT LONG TERM GOAL #1   Title  Pt will increase 30 sec sit to stand to 6 or more for further improvement in functional strength and mobility.    Baseline  2 on 05/26/19    Time  8    Period  Weeks    Status  New    Target Date  07/25/19      PT LONG TERM GOAL #2   Title  Pt will increase gait speed from 0.85m/s to >0.6 m/s for improved gait safety in house and community.    Baseline  gait speed=0.25m/s on 05/26/19    Time  8    Period  Weeks    Status  New    Target Date  07/25/19      PT LONG TERM GOAL #3   Title  Pt will decrease TUG to <18 sec for improved balance and decreased fall risk.    Baseline  28 sec on 05/26/19    Time  8    Period  Weeks    Status  New    Target Date  07/25/19      PT LONG TERM GOAL #4   Title  Pt will ambulate >400' on varied surfaces with SPC  mod I for improved gait safety.    Baseline  33' with FWW supervision on level surfaces on 05/26/19    Time  8    Period  Weeks    Status  New    Target Date  07/25/19      PT LONG TERM GOAL #5   Title  Pt will report <6/10 pain in knees with functional mobility in home for improved function.    Baseline  8/10 pain in knees with movement on 05/26/19    Time  8    Period  Weeks    Status  New    Target Date  07/25/19            Plan - 07/14/19 1534    Clinical Impression Statement  Pt was limited due to increased knee pain with activities today needing frequent breaks.    Personal  Factors and Comorbidities  Comorbidity 3+    Examination-Activity Limitations  Locomotion Level;Transfers;Squat;Stairs;Stand    Examination-Participation Restrictions  Community Activity;Driving;Shop;Meal Prep    Stability/Clinical Decision Making  Evolving/Moderate complexity    Rehab Potential  Good    PT Frequency  1x / week   followed by 2x/week for 4 weeks   PT Duration  3 weeks   followed by 2x/week for 4 weeks   PT Treatment/Interventions  ADLs/Self Care Home Management;Aquatic Therapy;DME Instruction;Gait training;Stair training;Functional mobility training;Therapeutic activities;Therapeutic exercise;Balance training;Patient/family education;Neuromuscular re-education;Energy conservation;Vestibular;Passive range of motion    PT Next Visit Plan  Next visit gait training, balance, and strengthening breaking up activities.    PT Home Exercise Plan  Access Code: sit to stands plus [email or web address]    Consulted and Agree with Plan of Care  Patient       Patient will benefit from skilled therapeutic intervention in order to improve the following deficits and impairments:  Abnormal gait, Cardiopulmonary status limiting activity, Decreased activity tolerance, Decreased balance, Decreased strength, Decreased mobility, Decreased knowledge of use of DME, Impaired flexibility, Decreased endurance, Difficulty walking, Postural dysfunction, Decreased range of motion  Visit Diagnosis: Muscle weakness (generalized)  Difficulty in walking, not elsewhere classified     Problem List Patient Active Problem List   Diagnosis Date Noted  . Dyspnea   . Knee pain   . Rhabdomyolysis due to statin therapy   . Demand ischemia ([facility])   . Sepsis ([place] [facility]) 05/13/2019  . NSTEMI (non-ST elevated myocardial infarction) ([facility]) 05/13/2019  . Coronary artery disease 02/12/2019  . Hyperlipidemia 02/12/2019  . Unstable angina ([facility])   . Near syncope 12/14/2018  . Chest tightness 12/14/2018  . Prolonged Q-T interval on ECG    . Acute gastroenteritis 08/02/2012  . Dehydration 08/02/2012  . AKI (acute kidney injury) ([facility]) 08/02/2012  . Hypokalemia 08/02/2012  . Diabetes mellitus ([facility]) 08/02/2012  . HTN (hypertension) 08/02/2012  . Tobacco abuse 08/02/2012  . ALLERGIC RHINITIS WITH CONJUNCTIVITIS 10/21/2010  . ROTATOR CUFF SYNDROME, RIGHT 10/21/2010  . SEBACEOUS CYST, INFECTED 01/31/2010  . GUAIAC POSITIVE STOOL 10/05/2009  . TOBACCO ABUSE 09/21/2009  . RECTAL BLEEDING, HX OF 09/21/2009  . Nonspecific (abnormal) findings on radiological and other examination of body structure 05/19/2009  . RIGHT LOWER LUNG FIELD CRACKLES 05/19/2009  . Diabetes ([facility]) 02/12/2009  . Pain in limb 02/12/2009  . DENTAL CARIES 04/21/2008  . HYPERTENSION 05/01/2007    Electa Sniff, PT, DPT, NCS 07/14/2019, 3:36 PM  [facility] [address] [place], [place], [address] Phone: [phone]   Fax:  [phone]  Name: Krista [name] MRN: [id number] Date of Birth: [birth year]

## 2019-07-16 ENCOUNTER — Other Ambulatory Visit: Payer: Self-pay

## 2019-07-16 ENCOUNTER — Ambulatory Visit: Payer: Medicaid Other | Attending: Family Medicine

## 2019-07-16 VITALS — BP 120/88

## 2019-07-16 DIAGNOSIS — M25511 Pain in right shoulder: Secondary | ICD-10-CM | POA: Diagnosis present

## 2019-07-16 DIAGNOSIS — R262 Difficulty in walking, not elsewhere classified: Secondary | ICD-10-CM | POA: Diagnosis present

## 2019-07-16 DIAGNOSIS — R2681 Unsteadiness on feet: Secondary | ICD-10-CM | POA: Diagnosis present

## 2019-07-16 DIAGNOSIS — M25611 Stiffness of right shoulder, not elsewhere classified: Secondary | ICD-10-CM | POA: Insufficient documentation

## 2019-07-16 DIAGNOSIS — G8929 Other chronic pain: Secondary | ICD-10-CM | POA: Diagnosis present

## 2019-07-16 DIAGNOSIS — M6281 Muscle weakness (generalized): Secondary | ICD-10-CM | POA: Insufficient documentation

## 2019-07-16 NOTE — Therapy (Signed)
[facility] [address] [place], [place], [address] Phone: [phone]   Fax:  [phone]  Physical Therapy Treatment/Recert  Patient Details  Name: RAYNA BRENNER MRN: [id number] Date of Birth: [birth year] Referring Provider (PT): Willey Blade   Encounter Date: 07/16/2019  PT End of Session - 07/16/19 1237    Visit Number  9    Number of Visits  25    Authorization Time Period  8 visits 06/29/1212/20    Authorization - Visit Number  8    Authorization - Number of Visits  10    PT Start Time  1232    PT Stop Time  1310    PT Time Calculation (min)  38 min    Activity Tolerance  Patient tolerated treatment well    Behavior During Therapy  [facility] for tasks assessed/performed       Past Medical History:  Diagnosis Date  . Arthritis   . CAD (coronary artery disease)   . Depression   . Diabetes mellitus   . Hypertension   . NSTEMI (non-ST elevated myocardial infarction) [facility])     Past Surgical History:  Procedure Laterality Date  . ANKLE SURGERY     left ankle - was broken  . CORONARY STENT INTERVENTION N/A 12/16/2018   Procedure: CORONARY STENT INTERVENTION;  Surgeon: Leonie Man, MD;  Location: [facility];  Service: Cardiovascular;  Laterality: N/A;  LAD  . LEFT HEART CATH AND CORONARY ANGIOGRAPHY N/A 12/16/2018   Procedure: LEFT HEART CATH AND CORONARY ANGIOGRAPHY;  Surgeon: Leonie Man, MD;  Location: [facility];  Service: Cardiovascular;  Laterality: N/A;    Vitals:   07/16/19 1236  BP: 120/88    Subjective Assessment - 07/16/19 1236    Subjective  Pt reports feeling a little better than the other day but still sore in joints.    Patient Stated Goals  Pt would like to get better to be able to move easier with less pain and more energy.    Currently in Pain?  Yes    Pain Score  6     Pain Location  Knee    Pain Orientation  Right;Left    Pain Descriptors / Indicators   Aching;Sore    Pain Type  Chronic pain    Pain Onset  More than a month ago    Pain Frequency  Constant                       [facility] Adult PT Treatment/Exercise - 07/16/19 1238      Transfers   Transfers  Sit to Stand;Stand to Sit    Sit to Stand  6: Modified independent (Device/Increase time)    Stand to Sit  6: Modified independent (Device/Increase time)    Comments  30 sec sit to stand=5 reps      Ambulation/Gait   Ambulation/Gait  Yes    Ambulation/Gait Assistance  6: Modified independent (Device/Increase time)    Ambulation Distance (Feet)  180 Feet    Assistive device  Rolling walker    Gait Pattern  Step-through pattern;Decreased step length - right;Decreased step length - left    Ambulation Surface  Level;Indoor    Gait velocity  19.18sec=0.59ms      Standardized Balance Assessment   Standardized Balance Assessment  Timed Up and Go Test      Timed Up and Go Test   TUG  Normal TUG  Normal TUG (seconds)  21      Knee/Hip Exercises: Aerobic   Nustep  Level 3 x 7 min with 2 rest breaks during due to fatigue/pain.               PT Short Term Goals - 07/16/19 1248      PT SHORT TERM GOAL #1   Title  Pt will be independent with HEP to continue to work on strength, balance and functional mobility gains at home.    Baseline  Pt has been working on her initial HEP daily as instructed.    Time  4    Period  Weeks    Status  Achieved    Target Date  06/25/19      PT SHORT TERM GOAL #2   Title  Pt will increase 30 sec sit to stand from 2 reps to 4 or more for improved functional strength and mobility.    Baseline  4 sit to stands in 30 sec on 06/30/19    Time  4    Period  Weeks    Status  Achieved    Target Date  06/25/19      PT SHORT TERM GOAL #3   Title  Pt will decrease TUG from 28 sec to <22 sec for improved balance and decreased fall risk.    Baseline  29 sec and 25 sec with average of 27 sec on 06/30/19. 07/16/2019 TUG=21 sec meeting  goal.    Time  4    Period  Weeks    Status  Achieved    Target Date  06/25/19      PT SHORT TERM GOAL #4   Title  Pt will ambulate >250' with FWW mod I for improved household and short community distances.    Baseline  180' mod I with FWW.    Time  4    Period  Weeks    Status  On-going    Target Date  06/25/19        PT Long Term Goals - 07/16/19 1250      PT LONG TERM GOAL #1   Title  Pt will increase 30 sec sit to stand to 6 or more for further improvement in functional strength and mobility.    Baseline  07/16/2019 pt performed 5 reps    Time  8    Period  Weeks    Status  On-going      PT LONG TERM GOAL #2   Title  Pt will increase gait speed from 0.24ms to >0.6 m/s for improved gait safety in house and community.    Baseline  07/16/2019 gait speed=0.561m    Time  8    Period  Weeks    Status  On-going      PT LONG TERM GOAL #3   Title  Pt will decrease TUG to <18 sec for improved balance and decreased fall risk.    Baseline  07/16/2019 TUG=21 sec    Time  8    Period  Weeks    Status  On-going      PT LONG TERM GOAL #4   Title  Pt will ambulate >400' on varied surfaces with SPC  mod I for improved gait safety.    Baseline  Varied distances 180-200' with RW mod I. Limited due to pain in knees and fatigue.    Time  8    Period  Weeks    Status  On-going  PT LONG TERM GOAL #5   Title  Pt will report <6/10 pain in knees with functional mobility in home for improved function.    Baseline  Not consistently meeting pain goal as varies day to day. Last week ranged from 8-6/10 in knees.    Time  8    Period  Weeks    Status  On-going      Updated goals: PT Short Term Goals - 07/16/19 1456      PT SHORT TERM GOAL #1   Title  Pt will be independent with progressive HEP for strength, balance and functional mobility to continue gains on own.    Baseline  pt has been started on initial HEP.    Time  4    Period  Weeks    Status  Revised    Target Date   08/15/19      PT SHORT TERM GOAL #2   Title  Pt will increase gait speed from 0.52 to >0.43ms for improved gait safety.    Baseline  0.510m on 07/16/2019    Time  4    Period  Weeks    Status  New    Target Date  08/15/19      PT SHORT TERM GOAL #3   Title  Pt will decrease TUG form 21 sec to <18 sec for improved balance and decreased fall risk.    Baseline  21 sec    Time  4    Period  Weeks    Status  Revised    Target Date  08/15/19      PT SHORT TERM GOAL #4   Title  Pt will ambulate >250' with FWW mod I for improved household and short community distances.    Baseline  180' mod I with FWW.    Time  4    Period  Weeks    Status  On-going    Target Date  08/15/19      PT Long Term Goals - 07/16/19 1506      PT LONG TERM GOAL #1   Title  Pt will increase 30 sec sit to stand to 7 or more for further improvement in functional strength and mobility.    Baseline  07/16/2019 pt performed 5 reps    Time  8    Period  Weeks    Status  Revised    Target Date  09/14/19      PT LONG TERM GOAL #2   Title  Pt will increase gait speed from 0.5227mto >0.72 m/s for improved gait safety in house and community.    Baseline  07/16/2019 gait speed=0.80m32m  Time  8    Period  Weeks    Status  Revised    Target Date  09/14/19      PT LONG TERM GOAL #3   Title  Pt will decrease TUG to <15 sec for improved balance and decreased fall risk.    Baseline  07/16/2019 TUG=21 sec    Time  8    Period  Weeks    Status  Revised    Target Date  09/14/19      PT LONG TERM GOAL #4   Title  Pt will ambulate >400' on varied surfaces with SPC  mod I for improved gait safety.    Baseline  Varied distances 180-200' with RW mod I. Limited due to pain in knees and fatigue.    Time  8  Period  Weeks    Status  On-going    Target Date  09/14/19      PT LONG TERM GOAL #5   Title  Pt will report <6/10 pain in knees with functional mobility in home for improved function.    Baseline  Not  consistently meeting pain goal as varies day to day. Last week ranged from 8-6/10 in knees.    Time  8    Period  Weeks    Status  On-going    Target Date  09/14/19            Plan - 07/16/19 1431    Clinical Impression Statement  Pt continues to show progress towards goals. Pt met STG for TUG completing in 21 sec. Pt showed improved functional strength with increase in 30 sec sit to stand. Her gait speed is now at safer household ambulator speed. Biggest limitation remain joint pain and fatigue. Pt will continue to benefit from skilled PT to further progress gait, balance, strength and activity tolerance.    Personal Factors and Comorbidities  Comorbidity 3+    Examination-Activity Limitations  Locomotion Level;Transfers;Squat;Stairs;Stand    Examination-Participation Restrictions  Community Activity;Driving;Shop;Meal Prep    Stability/Clinical Decision Making  Evolving/Moderate complexity    Rehab Potential  Good    PT Frequency  2x / week    PT Duration  8 weeks    PT Treatment/Interventions  ADLs/Self Care Home Management;Aquatic Therapy;DME Instruction;Gait training;Stair training;Functional mobility training;Therapeutic activities;Therapeutic exercise;Balance training;Patient/family education;Neuromuscular re-education;Energy conservation;Vestibular;Passive range of motion;Manual techniques    PT Next Visit Plan  Pending medicaid auth so please update visit numbers when received. continue with gait training, balance exercises, strengthening to improve activity tolerance. Break up activities as fatigue/pain limited due to the rhabdomyolisis. Consider aquatic PT in [place] possibly if patient would like to try?    PT Home Exercise Plan  Access Code: sit to stands plus [email or web address]    Consulted and Agree with Plan of Care  Patient       Patient will benefit from skilled therapeutic intervention in order to improve the following deficits and impairments:  Abnormal gait, Cardiopulmonary  status limiting activity, Decreased activity tolerance, Decreased balance, Decreased strength, Decreased mobility, Decreased knowledge of use of DME, Impaired flexibility, Decreased endurance, Difficulty walking, Postural dysfunction, Decreased range of motion  Visit Diagnosis: Muscle weakness (generalized)  Difficulty in walking, not elsewhere classified     Problem List Patient Active Problem List   Diagnosis Date Noted  . Dyspnea   . Knee pain   . Rhabdomyolysis due to statin therapy   . Demand ischemia ([place])   . Sepsis ([place]) 05/13/2019  . NSTEMI (non-ST elevated myocardial infarction) ([place]) 05/13/2019  . Coronary artery disease 02/12/2019  . Hyperlipidemia 02/12/2019  . Unstable angina ([place])   . Near syncope 12/14/2018  . Chest tightness 12/14/2018  . Prolonged Q-T interval on ECG   . Acute gastroenteritis 08/02/2012  . Dehydration 08/02/2012  . AKI (acute kidney injury) ([place]) 08/02/2012  . Hypokalemia 08/02/2012  . Diabetes mellitus ([place]) 08/02/2012  . HTN (hypertension) 08/02/2012  . Tobacco abuse 08/02/2012  . ALLERGIC RHINITIS WITH CONJUNCTIVITIS 10/21/2010  . ROTATOR CUFF SYNDROME, RIGHT 10/21/2010  . SEBACEOUS CYST, INFECTED 01/31/2010  . GUAIAC POSITIVE STOOL 10/05/2009  . TOBACCO ABUSE 09/21/2009  . RECTAL BLEEDING, HX OF 09/21/2009  . Nonspecific (abnormal) findings on radiological and other examination of body structure 05/19/2009  . RIGHT LOWER LUNG FIELD CRACKLES 05/19/2009  . Diabetes ([place])  02/12/2009  . Pain in limb 02/12/2009  . DENTAL CARIES 04/21/2008  . HYPERTENSION 05/01/2007    Electa Sniff, PT, DPT, NCS 07/16/2019, 2:43 PM  [facility] [address] [place], [place], [address] Phone: [phone]   Fax:  [phone]  Name: VIENNA FOLDEN MRN: [id number] Date of Birth: 15-Nov-1960

## 2019-07-17 ENCOUNTER — Ambulatory Visit: Payer: Medicaid Other | Admitting: Occupational Therapy

## 2019-07-24 ENCOUNTER — Ambulatory Visit: Payer: Medicaid Other | Admitting: Occupational Therapy

## 2019-07-24 ENCOUNTER — Other Ambulatory Visit: Payer: Self-pay

## 2019-07-24 DIAGNOSIS — M25611 Stiffness of right shoulder, not elsewhere classified: Secondary | ICD-10-CM

## 2019-07-24 DIAGNOSIS — M6281 Muscle weakness (generalized): Secondary | ICD-10-CM | POA: Diagnosis not present

## 2019-07-24 DIAGNOSIS — G8929 Other chronic pain: Secondary | ICD-10-CM

## 2019-07-24 DIAGNOSIS — R2681 Unsteadiness on feet: Secondary | ICD-10-CM

## 2019-07-24 NOTE — Therapy (Signed)
[facility] [address] [place], [place], [address] Phone: [phone]   Fax:  [phone]  Occupational Therapy Treatment  Patient Details  Name: Krista [name] MRN: [id number] Date of Birth: 1961-02-02 Referring Provider (OT): Willey Blade (FNP)   Encounter Date: 07/24/2019  OT End of Session - 07/24/19 1259    Visit Number  5    Number of Visits  9    Date for OT Re-Evaluation  08/14/19    Authorization Type  Medicaid    Authorization Time Period  Approved 8 visits 06/19/19 - 08/13/19    Authorization - Visit Number  4    Authorization - Number of Visits  8    OT Start Time  [id number]    OT Stop Time  1315    OT Time Calculation (min)  40 min    Activity Tolerance  Patient limited by fatigue       Past Medical History:  Diagnosis Date  . Arthritis   . CAD (coronary artery disease)   . Depression   . Diabetes mellitus   . Hypertension   . NSTEMI (non-ST elevated myocardial infarction) [facility])     Past Surgical History:  Procedure Laterality Date  . ANKLE SURGERY     left ankle - was broken  . CORONARY STENT INTERVENTION N/A 12/16/2018   Procedure: CORONARY STENT INTERVENTION;  Surgeon: Leonie Man, MD;  Location: [facility];  Service: Cardiovascular;  Laterality: N/A;  LAD  . LEFT HEART CATH AND CORONARY ANGIOGRAPHY N/A 12/16/2018   Procedure: LEFT HEART CATH AND CORONARY ANGIOGRAPHY;  Surgeon: Leonie Man, MD;  Location: [facility];  Service: Cardiovascular;  Laterality: N/A;    There were no vitals filed for this visit.  Subjective Assessment - 07/24/19 1240    Pertinent History  Presented to ED 05/13/19 after fall w/ sepsis and ARF, developed rhabdomyolysis. PMH: CAD, OA, DM, HTN, MI 12/13/18    Currently in Pain?  Yes    Pain Score  7     Pain Location  Shoulder    Pain Orientation  Right    Pain Descriptors / Indicators  Aching;Sore    Pain Type  Chronic pain    Pain Onset   More than a month ago    Pain Frequency  Intermittent    Aggravating Factors   Higher reaching    Pain Relieving Factors  rest       Pt standing for approx 3 min for functional ambulation initially but could have stood longer. Pt then stood to place pillow cases on pillows and simulate laundry task for 6 min w/o rest.  Functional high level reaching BUE's to place graded clothespins on antenna (red to black resistance) w/ RUE, then removing w/ LUE Gripper set at level 1 resistance to pick up blocks for sustained grip strength (1/2 with Rt hand, 1/2 w/ Lt hand). Attempted level 2 resistance but unable  Pt issued finger ROM HEP for Rt hand d/t IP stiffness. Pt encouraged to also continue shoulder HEP and putty HEP.  Wall slides for BUE sh flexion x 10 reps                    OT Education - 07/24/19 1247    Education Details  Finger HEP    Person(s) Educated  Patient    Methods  Explanation;Demonstration;Handout    Comprehension  Verbalized understanding;Returned demonstration  OT Short Term Goals - 07/24/19 1300      OT SHORT TERM GOAL #1   Title  Patient will complete HEP designed to improve BUE AROM without assistance    Baseline  No current exercise program    Time  4    Period  Weeks    Status  Achieved    Target Date  07/09/19      OT SHORT TERM GOAL #2   Title  Patient will ambulate in kitchen and prepare a simple cold snack and beverage  for herself without assistance    Baseline  Patient is currently reliant on her mother for all meals    Time  4    Period  Weeks    Status  Achieved      OT SHORT TERM GOAL #3   Title  Patient will stand to complete simple housekeeping task for greater than 10 minutes without rest    Baseline  Unable to stand more than 10 min    Time  4    Period  Weeks    Status  On-going   currently 5-6 minutes     OT SHORT TERM GOAL #4   Title  Patient will report pain no more than 4/10 in right shoulder with passive  range of motion exercise    Baseline  7/10 at rest    Time  4    Period  Weeks    Status  On-going        OT Long Term Goals - 07/24/19 1301      OT LONG TERM GOAL #1   Title  Patient will complete a home activities program designed to improve activity tolerance with daily life skills    Baseline  Patient reliant on elderly mother for ADL/IADL    Time  8    Period  Weeks    Status  On-going      OT LONG TERM GOAL #2   Title  Patient will complete a HEP designed to improve grip strength and finger range of motion    Baseline  Not currently exercising UE's    Time  8    Period  Weeks    Status  Achieved      OT LONG TERM GOAL #3   Title  Patient will complete all bathing, dressing, grooming at modified independent level    Baseline  Min assist    Time  8    Period  Weeks    Status  Achieved      OT LONG TERM GOAL #4   Title  Patient will complete simple warm meal prep at least 2 items  for she and her parents without assistance    Baseline  Patient unable to cook currently    Time  8    Period  Weeks    Status  New      OT LONG TERM GOAL #5   Title  Patient will complete at least 15-20 min of simple housekeeping tasks without rest break    Baseline  Is not currently completing, reliant on mother    Time  8    Period  Weeks    Status  New      OT LONG TERM GOAL #6   Title  Patient will demonstrate a 5 lb increase in grip strength in BUE  to aide with opening or carrying items    Baseline  R- 25LB, L- 15 LB    Time  8    Period  Weeks    Status  On-going            Plan - 07/24/19 1303    Clinical Impression Statement  Pt making gradual progress towards goals. Pt still limited in standing tolerance/endurance    Occupational performance deficits (Please refer to evaluation for details):  ADL's;IADL's;Rest and Sleep;Work;Leisure    Body Structure / Function / Physical Skills  ADL;Coordination;Endurance;GMC;UE functional use;Balance;Decreased knowledge of  precautions;Fascial restriction;Pain;IADL;Flexibility;Decreased knowledge of use of DME;Body mechanics;Cardiopulmonary status limiting activity;Dexterity;FMC;Strength;Tone;ROM;Mobility    Rehab Potential  Good    OT Frequency  1x / week    OT Duration  8 weeks    OT Treatment/Interventions  Self-care/ADL training;Iontophoresis;Therapeutic exercise;Electrical Stimulation;Patient/family education;Splinting;Neuromuscular education;Moist Heat;Aquatic Therapy;Fluidtherapy;Functional Mobility Training;Energy conservation;Therapeutic activities;Balance training;Manual Therapy;DME and/or AE instruction;Ultrasound;Cryotherapy    Plan  continue UE ROM/strength, standing tolerance for IADLS    Consulted and Agree with Plan of Care  Patient       Patient will benefit from skilled therapeutic intervention in order to improve the following deficits and impairments:   Body Structure / Function / Physical Skills: ADL, Coordination, Endurance, GMC, UE functional use, Balance, Decreased knowledge of precautions, Fascial restriction, Pain, IADL, Flexibility, Decreased knowledge of use of DME, Body mechanics, Cardiopulmonary status limiting activity, Dexterity, FMC, Strength, Tone, ROM, Mobility       Visit Diagnosis: Muscle weakness (generalized)  Unsteadiness on feet  Stiffness of right shoulder, not elsewhere classified  Chronic right shoulder pain    Problem List Patient Active Problem List   Diagnosis Date Noted  . Dyspnea   . Knee pain   . Rhabdomyolysis due to statin therapy   . Demand ischemia ([place])   . Sepsis ([place]) 05/13/2019  . NSTEMI (non-ST elevated myocardial infarction) ([place]) 05/13/2019  . Coronary artery disease 02/12/2019  . Hyperlipidemia 02/12/2019  . Unstable angina ([place])   . Near syncope 12/14/2018  . Chest tightness 12/14/2018  . Prolonged Q-T interval on ECG   . Acute gastroenteritis 08/02/2012  . Dehydration 08/02/2012  . AKI (acute kidney injury) ([place]) 08/02/2012  .  Hypokalemia 08/02/2012  . Diabetes mellitus ([place]) 08/02/2012  . HTN (hypertension) 08/02/2012  . Tobacco abuse 08/02/2012  . ALLERGIC RHINITIS WITH CONJUNCTIVITIS 10/21/2010  . ROTATOR CUFF SYNDROME, RIGHT 10/21/2010  . SEBACEOUS CYST, INFECTED 01/31/2010  . GUAIAC POSITIVE STOOL 10/05/2009  . TOBACCO ABUSE 09/21/2009  . RECTAL BLEEDING, HX OF 09/21/2009  . Nonspecific (abnormal) findings on radiological and other examination of body structure 05/19/2009  . RIGHT LOWER LUNG FIELD CRACKLES 05/19/2009  . Diabetes ([place]) 02/12/2009  . Pain in limb 02/12/2009  . DENTAL CARIES 04/21/2008  . HYPERTENSION 05/01/2007    Carey Bullocks, OTR/L 07/24/2019, 1:17 PM  [facility] [address] [place], [place], [address] Phone: [phone]   Fax:  [phone]  Name: Krista [name] MRN: [id number] Date of Birth: 1961-07-06

## 2019-07-24 NOTE — Patient Instructions (Signed)
  Flexor Tendon Gliding (Active Hook Fist)   With fingers and knuckles straight, bend middle and tip joints. Do not bend large knuckles. Repeat _10-15___ times. Do _4-6___ sessions per day.  MP Flexion (Active)   With back of hand on table, bend large knuckles as far as they will go, keeping small joints straight. Repeat _10-15___ times. Do __4-6__ sessions per day. Activity: Reach into a narrow container.*      Finger Flexion / Extension   With palm up, bend fingers of left hand toward palm, making a  fist. Straighten fingers, opening fist. Repeat sequence _10-15___ times per session. Do _4-6__ sessions per day. Hand Variation: Palm down   Copyright  VHI. All rights reserved.   

## 2019-07-30 ENCOUNTER — Ambulatory Visit: Payer: Medicaid Other | Admitting: Physical Therapy

## 2019-07-31 ENCOUNTER — Ambulatory Visit: Payer: Medicaid Other | Admitting: Occupational Therapy

## 2019-08-01 ENCOUNTER — Ambulatory Visit: Payer: Medicaid Other | Admitting: Physical Therapy

## 2019-08-01 ENCOUNTER — Other Ambulatory Visit: Payer: Self-pay

## 2019-08-01 DIAGNOSIS — R2681 Unsteadiness on feet: Secondary | ICD-10-CM

## 2019-08-01 DIAGNOSIS — R262 Difficulty in walking, not elsewhere classified: Secondary | ICD-10-CM

## 2019-08-01 DIAGNOSIS — M6281 Muscle weakness (generalized): Secondary | ICD-10-CM

## 2019-08-01 NOTE — Therapy (Signed)
[facility] [address] [place], [place], [address] Phone: [phone]   Fax:  [phone]  Physical Therapy Treatment  Patient Details  Name: Krista [name] MRN: [id number] Date of Birth: [birth year] Referring Provider (PT): Willey Blade   Encounter Date: 08/01/2019  PT End of Session - 08/01/19 1435    Visit Number  10    Number of Visits  25    Date for PT Re-Evaluation  [id number]   90 day cert but 60 day plan due to scheduling   Authorization Time Period  8 visits 06/29/1212/20; additional approved 4 visits 07/28/2019-08/10/2019    Authorization - Visit Number  1    Authorization - Number of Visits  4    PT Start Time  1022    PT Stop Time  1101    PT Time Calculation (min)  39 min    Activity Tolerance  Patient tolerated treatment well;Patient limited by fatigue    Behavior During Therapy  [facility] for tasks assessed/performed       Past Medical History:  Diagnosis Date  . Arthritis   . CAD (coronary artery disease)   . Depression   . Diabetes mellitus   . Hypertension   . NSTEMI (non-ST elevated myocardial infarction) [facility])     Past Surgical History:  Procedure Laterality Date  . ANKLE SURGERY     left ankle - was broken  . CORONARY STENT INTERVENTION N/A 12/16/2018   Procedure: CORONARY STENT INTERVENTION;  Surgeon: Leonie Man, MD;  Location: [place] [facility];  Service: Cardiovascular;  Laterality: N/A;  LAD  . LEFT HEART CATH AND CORONARY ANGIOGRAPHY N/A 12/16/2018   Procedure: LEFT HEART CATH AND CORONARY ANGIOGRAPHY;  Surgeon: Leonie Man, MD;  Location: [facility];  Service: Cardiovascular;  Laterality: N/A;    There were no vitals filed for this visit.  Subjective Assessment - 08/01/19 1026    Subjective  Feeling better today than earlier this week.    Pertinent History  58 y/o female hospitalized 9/29 to 10/2 due to rhabdomyolysis. Presented with chest pain after fall.PMH:  MI, CAD s/p stent placement, AKI, DM, HTN, hyperlipidemia    Patient Stated Goals  Pt would like to get better to be able to move easier with less pain and more energy.    Currently in Pain?  Yes    Pain Score  8     Pain Location  --   generalized, all over   Pain Descriptors / Indicators  Aching    Pain Type  Chronic pain    Pain Onset  More than a month ago    Pain Frequency  Intermittent    Aggravating Factors   not sure    Pain Relieving Factors  not sure                       [facility] Adult PT Treatment/Exercise - 08/01/19 1031      Transfers   Transfers  Sit to Stand;Stand to Sit    Sit to Stand  6: Modified independent (Device/Increase time);With upper extremity assist;From elevated surface;From bed    Stand to Sit  6: Modified independent (Device/Increase time);With upper extremity assist;To elevated surface;To bed    Number of Reps  2 sets;Other reps (comment)   5 reps; additional 5 reps throughout session from chair     Ambulation/Gait   Ambulation/Gait  Yes    Ambulation/Gait Assistance  6: Modified  independent (Device/Increase time)    Ambulation Distance (Feet)  115 Feet   80 ft x 2   Assistive device  Rolling walker    Gait Pattern  Step-through pattern;Decreased step length - right;Decreased step length - left   Forward flexed posture on walker   Ambulation Surface  Level;Indoor    Gait Comments  Cues for upright posture, decreased UE reliance through RW with gait      Posture/Postural Control   Posture/Postural Control  Postural limitations    Posture Comments  Seated anterior/posterior pelvic tilts x 10 reps.  Abdominal activation in sitting with alternating UE lifts; reaching across body x 5 reps to target; forward lean to upright posture, x 5 reps for upright posture.      Self-Care   Self-Care  Other Self-Care Comments    Other Self-Care Comments   During seated rest breaks, reiterated importance of consistent performance of HEP (pt reports  doing daily); disucssed how standing exercises performed today can help with hosuehold tasks and ADLs (wider BOS with weigthshfiting for improved reaching and weigthshfiting).  Discussed wlaking program at home-she is currently walking one lap at a time, 5 laps over the course of a day, limited due to fatigue.      Knee/Hip Exercises: Standing   Hip Abduction  AROM;Stengthening;1 set;Right;Left;10 reps   Alternating kicks   Abduction Limitations  cues for posture and technique; then second set alternating step taps to side, 5 reps    Hip Extension  AROM;Right;Left;1 set;5 reps;Knee straight    Extension Limitations  Backward step taps at counter      Knee/Hip Exercises: Seated   Long Arc Quad  AROM;Right;Left;2 sets;10 reps          Balance Exercises - 08/01/19 1040      Balance Exercises: Standing   Other Standing Exercises Comments  Standing with feet apart, with UE alternating lifts, then standing reaching x 10 reps each side (across body), then across body reach at counter x 10 reps; lateral wide BOS support x 10 reps, then stagger stance forward/back rocking x 10 reps.  Pt requires 3 seated rest breaks during standing activity.          PT Short Term Goals - 07/16/19 1456      PT SHORT TERM GOAL #1   Title  Pt will be independent with progressive HEP for strength, balance and functional mobility to continue gains on own.    Baseline  pt has been started on initial HEP.    Time  4    Period  Weeks    Status  Revised    Target Date  08/15/19      PT SHORT TERM GOAL #2   Title  Pt will increase gait speed from 0.52 to >0.52m/s for improved gait safety.    Baseline  0.91m/s on 07/16/2019    Time  4    Period  Weeks    Status  New    Target Date  08/15/19      PT SHORT TERM GOAL #3   Title  Pt will decrease TUG form 21 sec to <18 sec for improved balance and decreased fall risk.    Baseline  21 sec    Time  4    Period  Weeks    Status  Revised    Target Date   08/15/19      PT SHORT TERM GOAL #4   Title  Pt will ambulate >250' with FWW mod  I for improved household and short community distances.    Baseline  180' mod I with FWW.    Time  4    Period  Weeks    Status  On-going    Target Date  08/15/19        PT Long Term Goals - 07/16/19 1506      PT LONG TERM GOAL #1   Title  Pt will increase 30 sec sit to stand to 7 or more for further improvement in functional strength and mobility.    Baseline  07/16/2019 pt performed 5 reps    Time  8    Period  Weeks    Status  Revised    Target Date  09/14/19      PT LONG TERM GOAL #2   Title  Pt will increase gait speed from 0.86m/s to >0.72 m/s for improved gait safety in house and community.    Baseline  07/16/2019 gait speed=0.65m/s    Time  8    Period  Weeks    Status  Revised    Target Date  09/14/19      PT LONG TERM GOAL #3   Title  Pt will decrease TUG to <15 sec for improved balance and decreased fall risk.    Baseline  07/16/2019 TUG=21 sec    Time  8    Period  Weeks    Status  Revised    Target Date  09/14/19      PT LONG TERM GOAL #4   Title  Pt will ambulate >400' on varied surfaces with SPC  mod I for improved gait safety.    Baseline  Varied distances 180-200' with RW mod I. Limited due to pain in knees and fatigue.    Time  8    Period  Weeks    Status  On-going    Target Date  09/14/19      PT LONG TERM GOAL #5   Title  Pt will report <6/10 pain in knees with functional mobility in home for improved function.    Baseline  Not consistently meeting pain goal as varies day to day. Last week ranged from 8-6/10 in knees.    Time  8    Period  Weeks    Status  On-going    Target Date  09/14/19            Plan - 08/01/19 1436    Clinical Impression Statement  Skilled PT session focused on combination of standing, seated, postural and UE exercises during session today, with occasional seated rest breaks throughout session.  She responds well to cues for more  upright posture during standing exercises, but again, limited due to fatigue.  She will continue to benefit from skilled PT to work towards goals of rimproved gait, balance, strength.    Personal Factors and Comorbidities  Comorbidity 3+    Examination-Activity Limitations  Locomotion Level;Transfers;Squat;Stairs;Stand    Examination-Participation Restrictions  Community Activity;Driving;Shop;Meal Prep    Stability/Clinical Decision Making  Evolving/Moderate complexity    Rehab Potential  Good    PT Frequency  2x / week    PT Duration  8 weeks    PT Treatment/Interventions  ADLs/Self Care Home Management;Aquatic Therapy;DME Instruction;Gait training;Stair training;Functional mobility training;Therapeutic activities;Therapeutic exercise;Balance training;Patient/family education;Neuromuscular re-education;Energy conservation;Vestibular;Passive range of motion;Manual techniques    PT Next Visit Plan  (Next week, will need to assess STGs and comment on baselines for 2021 reauth); needs scheduling into January; continue with  gait training, balance exercises, strengthening to improve activity tolerance. Break up activities as fatigue/pain limited due to the rhabdomyolisis. Consider aquatic PT in [place] Year possibly if patient would like to try?    PT Home Exercise Plan  Access Code: sit to stands plus [email or web address]    Consulted and Agree with Plan of Care  Patient       Patient will benefit from skilled therapeutic intervention in order to improve the following deficits and impairments:  Abnormal gait, Cardiopulmonary status limiting activity, Decreased activity tolerance, Decreased balance, Decreased strength, Decreased mobility, Decreased knowledge of use of DME, Impaired flexibility, Decreased endurance, Difficulty walking, Postural dysfunction, Decreased range of motion  Visit Diagnosis: Unsteadiness on feet  Muscle weakness (generalized)  Difficulty in walking, not elsewhere classified     Problem  List Patient Active Problem List   Diagnosis Date Noted  . Dyspnea   . Knee pain   . Rhabdomyolysis due to statin therapy   . Demand ischemia ([place])   . Sepsis ([place]) 05/13/2019  . NSTEMI (non-ST elevated myocardial infarction) ([place]) 05/13/2019  . Coronary artery disease 02/12/2019  . Hyperlipidemia 02/12/2019  . Unstable angina ([place])   . Near syncope 12/14/2018  . Chest tightness 12/14/2018  . Prolonged Q-T interval on ECG   . Acute gastroenteritis 08/02/2012  . Dehydration 08/02/2012  . AKI (acute kidney injury) ([place]) 08/02/2012  . Hypokalemia 08/02/2012  . Diabetes mellitus ([place]) 08/02/2012  . HTN (hypertension) 08/02/2012  . Tobacco abuse 08/02/2012  . ALLERGIC RHINITIS WITH CONJUNCTIVITIS 10/21/2010  . ROTATOR CUFF SYNDROME, RIGHT 10/21/2010  . SEBACEOUS CYST, INFECTED 01/31/2010  . GUAIAC POSITIVE STOOL 10/05/2009  . TOBACCO ABUSE 09/21/2009  . RECTAL BLEEDING, HX OF 09/21/2009  . Nonspecific (abnormal) findings on radiological and other examination of body structure 05/19/2009  . RIGHT LOWER LUNG FIELD CRACKLES 05/19/2009  . Diabetes ([place]) 02/12/2009  . Pain in limb 02/12/2009  . DENTAL CARIES 04/21/2008  . HYPERTENSION 05/01/2007    Brailyn Delman W. 08/01/2019, 2:42 PM  Frazier Butt., PT  [facility] [address] [place], [place], [address] Phone: [phone]   Fax:  [phone]  Name: LORAE IBSEN MRN: [id number] Date of Birth: December 04, 1960

## 2019-08-04 ENCOUNTER — Other Ambulatory Visit: Payer: Self-pay

## 2019-08-04 ENCOUNTER — Ambulatory Visit: Payer: Medicaid Other

## 2019-08-04 DIAGNOSIS — M6281 Muscle weakness (generalized): Secondary | ICD-10-CM | POA: Diagnosis not present

## 2019-08-04 DIAGNOSIS — R262 Difficulty in walking, not elsewhere classified: Secondary | ICD-10-CM

## 2019-08-04 NOTE — Therapy (Signed)
[facility] [address] [place], [place], [address] Phone: [phone]   Fax:  [phone]  Physical Therapy Treatment  Patient Details  Name: Krista [name] MRN: [id number] Date of Birth: 21-Nov-1960 Referring Provider (PT): Willey Blade   Encounter Date: 08/04/2019  PT End of Session - 08/04/19 1234    Visit Number  11    Number of Visits  25    Date for PT Re-Evaluation  23/30/07   90 day cert but 60 day plan due to scheduling   Authorization Time Period  8 visits 06/29/1212/20; additional approved 4 visits 07/28/2019-08/10/2019    Authorization - Visit Number  2    Authorization - Number of Visits  4    PT Start Time  6226    PT Stop Time  1311    PT Time Calculation (min)  38 min    Activity Tolerance  Patient tolerated treatment well;Patient limited by fatigue    Behavior During Therapy  [facility] for tasks assessed/performed       Past Medical History:  Diagnosis Date  . Arthritis   . CAD (coronary artery disease)   . Depression   . Diabetes mellitus   . Hypertension   . NSTEMI (non-ST elevated myocardial infarction) [facility])     Past Surgical History:  Procedure Laterality Date  . ANKLE SURGERY     left ankle - was broken  . CORONARY STENT INTERVENTION N/A 12/16/2018   Procedure: CORONARY STENT INTERVENTION;  Surgeon: Leonie Man, MD;  Location: [facility];  Service: Cardiovascular;  Laterality: N/A;  LAD  . LEFT HEART CATH AND CORONARY ANGIOGRAPHY N/A 12/16/2018   Procedure: LEFT HEART CATH AND CORONARY ANGIOGRAPHY;  Surgeon: Leonie Man, MD;  Location: [facility];  Service: Cardiovascular;  Laterality: N/A;    There were no vitals filed for this visit.  Subjective Assessment - 08/04/19 1235    Subjective  Pt reports that she is tired today. Knees are still bothering her.    Pertinent History  58 y/o female hospitalized 9/29 to 10/2 due to rhabdomyolysis. Presented with chest  pain after fall.PMH: MI, CAD s/p stent placement, AKI, DM, HTN, hyperlipidemia    Patient Stated Goals  Pt would like to get better to be able to move easier with less pain and more energy.    Currently in Pain?  Yes    Pain Score  8     Pain Location  Knee    Pain Orientation  Right;Left    Pain Descriptors / Indicators  Aching    Pain Type  Chronic pain    Pain Onset  More than a month ago    Pain Frequency  Intermittent                       [facility] Adult PT Treatment/Exercise - 08/04/19 1243      Transfers   Transfers  Sit to Stand;Stand to Sit    Sit to Stand  6: Modified independent (Device/Increase time)    Stand to Sit  6: Modified independent (Device/Increase time)      Ambulation/Gait   Ambulation/Gait  Yes    Ambulation/Gait Assistance  6: Modified independent (Device/Increase time)    Ambulation Distance (Feet)  150 Feet    Assistive device  Rolling walker    Gait Pattern  Step-through pattern    Ambulation Surface  Level;Indoor    Gait velocity  0.27ms  Gait Comments  1 standing rest break with gait due to fatigue      Standardized Balance Assessment   Standardized Balance Assessment  Timed Up and Go Test      Timed Up and Go Test   TUG  Normal TUG    Normal TUG (seconds)  18.5   average of 2 trials 18 sec and 19 sec with FWW     Neuro Re-ed    Neuro Re-ed Details   Standing in // bars: feet together with no UE support with alternating shoulder flexion x 10 with verbal cues to keep thumb up to help with rising on right, standing with eyes closed x 30 sec. Pt had increased sway with eyes closed and needed to touch occasionally. Staggered stance x 30 sec each position. Pt had increased sway with having to touch on right at times always losing balance to left when she did.  Tandem gait with UE support 6' x 4.  Walking in // bars with 1 UE support 6' x 2.       Exercises   Exercises  Other Exercises    Other Exercises   Standing in // bars: up on  toes/back on heels x 10, marching x 10 BLE, sidestepping in // bars 6' x 4 with bilateral UE support,  Seated rest breaks between activities.             PT Education - 08/04/19 1532    Education Details  Pt to continue with current HEP    Person(s) Educated  Patient    Methods  Explanation    Comprehension  Verbalized understanding       PT Short Term Goals - 08/04/19 1237      PT SHORT TERM GOAL #1   Title  Pt will be independent with progressive HEP for strength, balance and functional mobility to continue gains on own.    Baseline  Pt has been performing her exercises everyday and they are starting to get easier.    Time  4    Period  Weeks    Status  Achieved    Target Date  08/15/19      PT SHORT TERM GOAL #2   Title  Pt will increase gait speed from 0.52 to >0.70ms for improved gait safety.    Baseline  0.569m on 07/16/2019, 08/04/2019 gait speed=0.629m   Time  4    Period  Weeks    Status  Partially Met    Target Date  08/15/19      PT SHORT TERM GOAL #3   Title  Pt will decrease TUG form 21 sec to <18 sec for improved balance and decreased fall risk.    Baseline  08/04/2019 TUG=18.5 sec average of 2    Time  4    Period  Weeks    Status  Partially Met    Target Date  08/15/19      PT SHORT TERM GOAL #4   Title  Pt will ambulate >250' with FWW mod I for improved household and short community distances.    Baseline  180' mod I with FWW.    Time  4    Period  Weeks    Status  On-going    Target Date  08/15/19        PT Long Term Goals - 08/04/19 1240      PT LONG TERM GOAL #1   Title  Pt will increase 30 sec sit to  stand to 7 or more for further improvement in functional strength and mobility.    Baseline  07/16/2019 pt performed 5 reps    Time  8    Period  Weeks    Status  Revised      PT LONG TERM GOAL #2   Title  Pt will increase gait speed from 0.64ms to >0.72 m/s for improved gait safety in house and community.    Baseline  07/16/2019  gait speed=0.561m. 08/04/2019 gait speed=0.6239m   Time  8    Period  Weeks    Status  Revised      PT LONG TERM GOAL #3   Title  Pt will decrease TUG to <15 sec for improved balance and decreased fall risk.    Baseline  07/16/2019 TUG=21 sec, 08/04/2019 TUG=18.5 sec    Time  8    Period  Weeks    Status  On-going      PT LONG TERM GOAL #4   Title  Pt will ambulate >400' on varied surfaces with SPC  mod I for improved gait safety.    Baseline  Varied distances 180-200' with RW mod I. Limited due to pain in knees and fatigue.    Time  8    Period  Weeks    Status  On-going      PT LONG TERM GOAL #5   Title  Pt will report <6/10 pain in knees with functional mobility in home for improved function.    Baseline  Not consistently meeting pain goal as varies day to day. Last week ranged from 8-6/10 in knees.    Time  8    Period  Weeks    Status  On-going            Plan - 08/04/19 1534    Clinical Impression Statement  Pt was able to decrease TUG and increase gait speed today. Continues to be limited by fatigue with frequent rest breaks. Pt was demonstrating ability to walk with decreased UE support in // bars with only 1 support.    Personal Factors and Comorbidities  Comorbidity 3+    Examination-Activity Limitations  Locomotion Level;Transfers;Squat;Stairs;Stand    Examination-Participation Restrictions  Community Activity;Driving;Shop;Meal Prep    Stability/Clinical Decision Making  Evolving/Moderate complexity    Rehab Potential  Good    PT Frequency  2x / week    PT Duration  8 weeks    PT Treatment/Interventions  ADLs/Self Care Home Management;Aquatic Therapy;DME Instruction;Gait training;Stair training;Functional mobility training;Therapeutic activities;Therapeutic exercise;Balance training;Patient/family education;Neuromuscular re-education;Energy conservation;Vestibular;Passive range of motion;Manual techniques    PT Next Visit Plan  Reassess remaining goals for  baseline for Medicaid reauth for New Year. Pt has been scheduled out 2x/week for 6 weeks. OT will be asking for first 3. Please request Medicaid auth next visit.    PT Home Exercise Plan  Access Code: sit to stands plus [email or web address]    Consulted and Agree with Plan of Care  Patient       Patient will benefit from skilled therapeutic intervention in order to improve the following deficits and impairments:  Abnormal gait, Cardiopulmonary status limiting activity, Decreased activity tolerance, Decreased balance, Decreased strength, Decreased mobility, Decreased knowledge of use of DME, Impaired flexibility, Decreased endurance, Difficulty walking, Postural dysfunction, Decreased range of motion  Visit Diagnosis: Muscle weakness (generalized)  Difficulty in walking, not elsewhere classified     Problem List Patient Active Problem List   Diagnosis Date Noted  . Dyspnea   .  Knee pain   . Rhabdomyolysis due to statin therapy   . Demand ischemia ([place])   . Sepsis ([place]) 05/13/2019  . NSTEMI (non-ST elevated myocardial infarction) ([place]) 05/13/2019  . Coronary artery disease 02/12/2019  . Hyperlipidemia 02/12/2019  . Unstable angina ([place])   . Near syncope 12/14/2018  . Chest tightness 12/14/2018  . Prolonged Q-T interval on ECG   . Acute gastroenteritis 08/02/2012  . Dehydration 08/02/2012  . AKI (acute kidney injury) ([place]) 08/02/2012  . Hypokalemia 08/02/2012  . Diabetes mellitus ([place]) 08/02/2012  . HTN (hypertension) 08/02/2012  . Tobacco abuse 08/02/2012  . ALLERGIC RHINITIS WITH CONJUNCTIVITIS 10/21/2010  . ROTATOR CUFF SYNDROME, RIGHT 10/21/2010  . SEBACEOUS CYST, INFECTED 01/31/2010  . GUAIAC POSITIVE STOOL 10/05/2009  . TOBACCO ABUSE 09/21/2009  . RECTAL BLEEDING, HX OF 09/21/2009  . Nonspecific (abnormal) findings on radiological and other examination of body structure 05/19/2009  . RIGHT LOWER LUNG FIELD CRACKLES 05/19/2009  . Diabetes ([place]) 02/12/2009  . Pain in limb  02/12/2009  . DENTAL CARIES 04/21/2008  . HYPERTENSION 05/01/2007    Electa Sniff, PT, DPT, NCS 08/04/2019, 3:37 PM  [facility] [address] [place], [place], [address] Phone: [phone]   Fax:  [phone]  Name: LOUCILE POSNER MRN: [id number] Date of Birth: Feb 27, 1961

## 2019-08-05 ENCOUNTER — Encounter: Payer: Medicaid Other | Admitting: Internal Medicine

## 2019-08-07 ENCOUNTER — Ambulatory Visit: Payer: Medicaid Other | Admitting: Physical Therapy

## 2019-08-07 ENCOUNTER — Other Ambulatory Visit: Payer: Self-pay

## 2019-08-07 DIAGNOSIS — M6281 Muscle weakness (generalized): Secondary | ICD-10-CM | POA: Diagnosis not present

## 2019-08-07 DIAGNOSIS — R262 Difficulty in walking, not elsewhere classified: Secondary | ICD-10-CM

## 2019-08-07 DIAGNOSIS — R2681 Unsteadiness on feet: Secondary | ICD-10-CM

## 2019-08-07 NOTE — Therapy (Signed)
[facility] [address] [place], [place], [address] Phone: [phone]   Fax:  [phone]  Physical Therapy Treatment  Patient Details  Name: Krista [name] MRN: [id number] Date of Birth: [birth year] Referring Provider (PT): Willey Blade   Encounter Date: 08/07/2019  PT End of Session - 08/07/19 1513    Visit Number  12    Number of Visits  25    Date for PT Re-Evaluation  51/02/58   90 day cert but 60 day plan due to scheduling   Authorization Time Period  8 visits 06/29/1212/20; additional approved 4 visits 07/28/2019-08/10/2019    Authorization - Visit Number  3    Authorization - Number of Visits  4    PT Start Time  5277   Pt arrives late   PT Stop Time  1402    PT Time Calculation (min)  39 min    Activity Tolerance  Patient tolerated treatment well;Patient limited by fatigue    Behavior During Therapy  [facility] for tasks assessed/performed       Past Medical History:  Diagnosis Date  . Arthritis   . CAD (coronary artery disease)   . Depression   . Diabetes mellitus   . Hypertension   . NSTEMI (non-ST elevated myocardial infarction) [facility])     Past Surgical History:  Procedure Laterality Date  . ANKLE SURGERY     left ankle - was broken  . CORONARY STENT INTERVENTION N/A 12/16/2018   Procedure: CORONARY STENT INTERVENTION;  Surgeon: Leonie Man, MD;  Location: [facility];  Service: Cardiovascular;  Laterality: N/A;  LAD  . LEFT HEART CATH AND CORONARY ANGIOGRAPHY N/A 12/16/2018   Procedure: LEFT HEART CATH AND CORONARY ANGIOGRAPHY;  Surgeon: Leonie Man, MD;  Location: [facility];  Service: Cardiovascular;  Laterality: N/A;    There were no vitals filed for this visit.  Subjective Assessment - 08/07/19 1329    Subjective  Was late today, because I went back to sleep and my mom woke me up at 12:30.  Most days, I nap for several hours at a time.    Pertinent History  58 y/o  female hospitalized 9/29 to 10/2 due to rhabdomyolysis. Presented with chest pain after fall.PMH: MI, CAD s/p stent placement, AKI, DM, HTN, hyperlipidemia    Patient Stated Goals  Pt would like to get better to be able to move easier with less pain and more energy.    Currently in Pain?  Yes    Pain Score  8     Pain Location  Knee    Pain Orientation  Right;Left    Pain Descriptors / Indicators  Aching    Pain Onset  More than a month ago    Pain Frequency  Intermittent    Aggravating Factors   not sure, hurts all the time    Pain Relieving Factors  tylenol briefly helps                       [facility] Adult PT Treatment/Exercise - 08/07/19 0001      Transfers   Transfers  Sit to Stand;Stand to Sit    Sit to Stand  6: Modified independent (Device/Increase time)    Stand to Sit  6: Modified independent (Device/Increase time)      Ambulation/Gait   Ambulation/Gait  Yes    Ambulation/Gait Assistance  6: Modified independent (Device/Increase time)    Ambulation Distance (  Feet)  115 Feet   x 3; seated rest break    Assistive device  Rolling walker    Gait Pattern  Step-through pattern;Trunk flexed    Ambulation Surface  Level;Indoor    Gait Comments  Cues for upright posture, decreased reliance thorugh UEs for improved endurance with gait.      Self-Care   Self-Care  Other Self-Care Comments    Other Self-Care Comments   Discussed improving activity tolerance and endurance during pt's days at home (she reports that she sleeps for several hours at a time, several times per day in bed; at night, she stays up and watches TV, falling asleep early morning.  Educated patient in less rest/sleep during the day and increasing activity level during the day in order to better sleep at night.  Discussed acitvities (see instructions) for patient to do during the day to avoid sleeping more than 30 minute nap.  Again, discussed walking program for home.      Exercises   Exercises  Other  Exercises    Other Exercises   Sit<>stand x 8 reps during session with UE support; standing at counter, side stepping along counter R and L x 3 reps; then standing, to play Connect 4 game, x 4 minutes-cues for upright posture during activity (pt leans on counter most of the 4 minutes).      Knee/Hip Exercises: Seated   Long Arc Quad  AROM;Right;Left;2 sets;10 reps    Marching  AROM;Right;Left;1 set;10 reps     SEated ankle pumps x 10 reps   Additional gait 25 ft x 6 laps, using RW, simulating laps in home.  Discussed walking program and how to progress.     PT Education - 08/07/19 1512    Education Details  Additional standing and seated activities pt can do during the day to avoid excessive daytime sleeping to improve activity tolerance, endurance; walking program for home    Person(s) Educated  Patient    Methods  Explanation;Demonstration    Comprehension  Verbalized understanding;Returned demonstration       PT Short Term Goals - 08/07/19 1514      PT SHORT TERM GOAL #1   Title  Pt will be independent with progressive HEP for strength, balance and functional mobility to continue gains on own.    Baseline  Pt has been performing her exercises everyday and they are starting to get easier.    Time  4    Period  Weeks    Status  Achieved    Target Date  08/15/19      PT SHORT TERM GOAL #2   Title  Pt will increase gait speed from 0.52 to >0.63ms for improved gait safety.    Baseline  0.5108m on 07/16/2019, 08/04/2019 gait speed=0.6264m   Time  4    Period  Weeks    Status  Partially Met    Target Date  08/15/19      PT SHORT TERM GOAL #3   Title  Pt will decrease TUG form 21 sec to <18 sec for improved balance and decreased fall risk.    Baseline  08/04/2019 TUG=18.5 sec average of 2    Time  4    Period  Weeks    Status  Partially Met    Target Date  08/15/19      PT SHORT TERM GOAL #4   Title  Pt will ambulate >250' with FWW mod I for improved household and short  community  distances.    Baseline  180' mod I with FWW.; 115 ft x 3 reps with seated rest  breaks in between with RW 08/07/2019    Time  4    Period  Weeks    Status  Not Met    Target Date  08/15/19        PT Long Term Goals - 08/07/19 1514      PT LONG TERM GOAL #1   Title  Pt will increase 30 sec sit to stand to 7 or more for further improvement in functional strength and mobility.    Baseline  07/16/2019 pt performed 5 reps    Time  8    Period  Weeks    Status  Revised      PT LONG TERM GOAL #2   Title  Pt will increase gait speed from 0.53ms to >0.72 m/s for improved gait safety in house and community.    Baseline  07/16/2019 gait speed=0.587m. 08/04/2019 gait speed=0.6279m   Time  8    Period  Weeks    Status  Revised      PT LONG TERM GOAL #3   Title  Pt will decrease TUG to <15 sec for improved balance and decreased fall risk.    Baseline  07/16/2019 TUG=21 sec, 08/04/2019 TUG=18.5 sec    Time  8    Period  Weeks    Status  On-going      PT LONG TERM GOAL #4   Title  Pt will ambulate >400' on varied surfaces with SPC  mod I for improved gait safety.    Baseline  Varied distances 180-200' with RW mod I. Limited due to pain in knees and fatigue.    Time  8    Period  Weeks    Status  On-going      PT LONG TERM GOAL #5   Title  Pt will report <6/10 pain in knees with functional mobility in home for improved function.    Baseline  Not consistently meeting pain goal as varies day to day. Last week ranged from 8-6/10 in knees.    Time  8    Period  Weeks    Status  On-going            Plan - 08/07/19 1516    Clinical Impression Statement  Pt's STGS have been assessed this week; STG 1 met for HEP.  STG 2 and 3 partially met for gait velocity and TUG scores improved.  STG 4 not met for gait distance; pt ambulates 115 ft todya prior to seated rest break; 180f61ft best.  Pt continues to be limited by fatigue and pain; however, PT with pt's input discussed plan  for increased activity and exercise through the day to improve overall activity tolerance and endurance.  Pt agrees to work on this list of activities prior to return in January for further improved strength, balance and gait.  Pt will continue to beneift from skilled PT to address improved strength, balance, and gait for improved mobilitya nd decreased fall risk.    Personal Factors and Comorbidities  Comorbidity 3+    Examination-Activity Limitations  Locomotion Level;Transfers;Squat;Stairs;Stand    Examination-Participation Restrictions  Community Activity;Driving;Shop;Meal Prep    Stability/Clinical Decision Making  Evolving/Moderate complexity    Rehab Potential  Good    PT Frequency  2x / week    PT Duration  8 weeks    PT Treatment/Interventions  ADLs/Self Care Home  Management;Aquatic Therapy;DME Instruction;Gait training;Stair training;Functional mobility training;Therapeutic activities;Therapeutic exercise;Balance training;Patient/family education;Neuromuscular re-education;Energy conservation;Vestibular;Passive range of motion;Manual techniques    PT Next Visit Plan  Follow up with how patient has done with HEP and walking program, list of activities at home; update HEP as needed; work on improved balance, strength and gait training wtih improved posture.    PT Home Exercise Plan  Access Code: sit to stands plus [email or web address]    Consulted and Agree with Plan of Care  Patient       Patient will benefit from skilled therapeutic intervention in order to improve the following deficits and impairments:  Abnormal gait, Cardiopulmonary status limiting activity, Decreased activity tolerance, Decreased balance, Decreased strength, Decreased mobility, Decreased knowledge of use of DME, Impaired flexibility, Decreased endurance, Difficulty walking, Postural dysfunction, Decreased range of motion  Visit Diagnosis: Difficulty in walking, not elsewhere classified  Muscle weakness  (generalized)  Unsteadiness on feet     Problem List Patient Active Problem List   Diagnosis Date Noted  . Dyspnea   . Knee pain   . Rhabdomyolysis due to statin therapy   . Demand ischemia ([place])   . Sepsis ([place]) 05/13/2019  . NSTEMI (non-ST elevated myocardial infarction) ([place]) 05/13/2019  . Coronary artery disease 02/12/2019  . Hyperlipidemia 02/12/2019  . Unstable angina ([place])   . Near syncope 12/14/2018  . Chest tightness 12/14/2018  . Prolonged Q-T interval on ECG   . Acute gastroenteritis 08/02/2012  . Dehydration 08/02/2012  . AKI (acute kidney injury) ([place]) 08/02/2012  . Hypokalemia 08/02/2012  . Diabetes mellitus ([place]) 08/02/2012  . HTN (hypertension) 08/02/2012  . Tobacco abuse 08/02/2012  . ALLERGIC RHINITIS WITH CONJUNCTIVITIS 10/21/2010  . ROTATOR CUFF SYNDROME, RIGHT 10/21/2010  . SEBACEOUS CYST, INFECTED 01/31/2010  . GUAIAC POSITIVE STOOL 10/05/2009  . TOBACCO ABUSE 09/21/2009  . RECTAL BLEEDING, HX OF 09/21/2009  . Nonspecific (abnormal) findings on radiological and other examination of body structure 05/19/2009  . RIGHT LOWER LUNG FIELD CRACKLES 05/19/2009  . Diabetes ([place]) 02/12/2009  . Pain in limb 02/12/2009  . DENTAL CARIES 04/21/2008  . HYPERTENSION 05/01/2007    Tyriek Hofman W. 08/07/2019, 3:22 PM  Frazier Butt., PT   [facility] [address] [place], [place], [address] Phone: [phone]   Fax:  [phone]  Name: Krista [name] MRN: [id number] Date of Birth: November 28, 1960

## 2019-08-07 NOTE — Patient Instructions (Signed)
List of Activities to Increase your activity tolerance and Endurance:  1-Taking shorter naps-set your phone/alarm for 30 minutes (avoid naps more than 2-3 hours)  2-Make sure to do your exercises daily!  3-Household activities-standing to fold laundry, stand or sit to help with meal prep, gentle sweeping/mopping, dishes  4-Activities in sitting or standing (to stay out of bed)- work on your soduko puzzles, crossword puzzles, read, or play cards/Connect 4  Walking program  Start with walking 4 laps in your hallway at home, 3x/day   Every 4-5 days, add 2 laps of your hallway, 3x/day  You should be able to walk 8-10 laps in your hallway, 1-2x/day by the time you come back to PT in January 2020

## 2019-08-13 ENCOUNTER — Ambulatory Visit (INDEPENDENT_AMBULATORY_CARE_PROVIDER_SITE_OTHER): Payer: Medicaid Other | Admitting: Physician Assistant

## 2019-08-13 ENCOUNTER — Encounter: Payer: Self-pay | Admitting: Physician Assistant

## 2019-08-13 VITALS — BP 130/84 | HR 92 | Temp 98.3°F | Ht 63.0 in | Wt 184.6 lb

## 2019-08-13 DIAGNOSIS — Z1211 Encounter for screening for malignant neoplasm of colon: Secondary | ICD-10-CM

## 2019-08-13 DIAGNOSIS — Z800 Family history of malignant neoplasm of digestive organs: Secondary | ICD-10-CM | POA: Diagnosis not present

## 2019-08-13 NOTE — Progress Notes (Signed)
Reviewed and agree with management plans. Will proceed with colon cancer screening when her dual antiplatelet therapy can be safely held in preparation for the procedure. She should call with the onset of any new GI symptoms prior to that time.   Treyshawn Muldrew L. Tarri Glenn, MD, MPH

## 2019-08-13 NOTE — Progress Notes (Signed)
Subjective:    Patient ID: Krista [name], female    DOB: 02-10-1961, 58 y.o.   MRN: [id number]  HPI Krista [name] is a pleasant 58 year old African-American female, new to GI today referred by Krista [name]/[facility] for colon cancer screening. Patient has not had any prior colonoscopy.  She does have family history of colon cancer in her mother diagnosed in her 47s who underwent resection and says her father had polyps.  Patient is currently asymptomatic, specifically denies any issues with abdominal pain or discomfort, no melena or hematochezia, and no changes in bowel habits diarrhea or constipation. Patient has history of hypertension, coronary artery disease, adult onset diabetes mellitus.  She had a NSTEMI in May 2020 and underwent drug-eluting stent for 99% LAD occlusion.  She had 60% occlusion of the RCA.  She is on aspirin and Plavix and being followed by Dr. Quay Burow. Unfortunately she developed rhabdomyolysis in September 2020 felt related to statin use.  She had severe disease with inability to walk and is still undergoing physical therapy and ambulating with a walker.  She says she is making progress but is nowhere close to being back to baseline.  Review of Systems Pertinent positive and negative review of systems were noted in the above HPI section.  All other review of systems was otherwise negative.  Outpatient Encounter Medications as of 08/13/2019  Medication Sig  . acetaminophen (TYLENOL) 500 MG tablet Take 1,000 mg by mouth every 6 (six) hours as needed for mild pain or headache.  [name] aspirin EC 81 MG EC tablet Take 1 tablet (81 mg total) by mouth daily.  . clopidogrel (PLAVIX) 75 MG tablet Take 75 mg by mouth daily.  [name] diltiazem (DILACOR XR) 180 MG 24 hr capsule Take 180 mg by mouth daily.  . ergocalciferol (VITAMIN D2) 1.25 MG (50000 UT) capsule Take 50,000 Units by mouth once a week.  . ezetimibe (ZETIA) 10 MG tablet Take 10 mg by mouth daily.    [name] glimepiride (AMARYL) 2 MG tablet Take 2 mg by mouth daily with breakfast.  . loratadine (CLARITIN) 10 MG tablet Take 10 mg by mouth daily.  . metFORMIN (GLUCOPHAGE) 1000 MG tablet Take 1,000 mg by mouth 2 (two) times daily with a meal.  . nitroGLYCERIN (NITROSTAT) 0.4 MG SL tablet Place 1 tablet (0.4 mg total) under the tongue every 5 (five) minutes as needed for chest pain.  . pravastatin (PRAVACHOL) 20 MG tablet Take 20 mg by mouth daily.   No facility-administered encounter medications on file as of 08/13/2019.   Allergies  Allergen Reactions  . Statins     itching   Patient Active Problem List   Diagnosis Date Noted  . Dyspnea   . Knee pain   . Rhabdomyolysis due to statin therapy   . Demand ischemia ([place])   . Sepsis ([place]) 05/13/2019  . NSTEMI (non-ST elevated myocardial infarction) ([place]) 05/13/2019  . Coronary artery disease 02/12/2019  . Hyperlipidemia 02/12/2019  . Unstable angina ([place])   . Near syncope 12/14/2018  . Chest tightness 12/14/2018  . Prolonged Q-T interval on ECG   . Acute gastroenteritis 08/02/2012  . Dehydration 08/02/2012  . AKI (acute kidney injury) ([place]) 08/02/2012  . Hypokalemia 08/02/2012  . Diabetes mellitus ([place]) 08/02/2012  . HTN (hypertension) 08/02/2012  . Tobacco abuse 08/02/2012  . ALLERGIC RHINITIS WITH CONJUNCTIVITIS 10/21/2010  . ROTATOR CUFF SYNDROME, RIGHT 10/21/2010  . SEBACEOUS CYST, INFECTED 01/31/2010  . GUAIAC POSITIVE STOOL 10/05/2009  .  TOBACCO ABUSE 09/21/2009  . RECTAL BLEEDING, HX OF 09/21/2009  . Nonspecific (abnormal) findings on radiological and other examination of body structure 05/19/2009  . RIGHT LOWER LUNG FIELD CRACKLES 05/19/2009  . Diabetes ([place]) 02/12/2009  . Pain in limb 02/12/2009  . DENTAL CARIES 04/21/2008  . HYPERTENSION 05/01/2007   Social History   Socioeconomic History  . Marital status: Single    Spouse name: Not on file  . Number of children: Not on file  . Years of education: Not on file   . Highest education level: Not on file  Occupational History  . Not on file  Tobacco Use  . Smoking status: Current Every Day Smoker    Packs/day: 0.50    Years: 33.00    Pack years: 16.50    Types: Cigarettes  . Smokeless tobacco: Never Used  Substance and Sexual Activity  . Alcohol use: No  . Drug use: No  . Sexual activity: Yes    Birth control/protection: Post-menopausal  Other Topics Concern  . Not on file  Social History Narrative  . Not on file   Social Determinants of Health   Financial Resource Strain:   . Difficulty of Paying Living Expenses: Not on file  Food Insecurity:   . Worried About Charity fundraiser in the Last Year: Not on file  . Ran Out of Food in the Last Year: Not on file  Transportation Needs:   . Lack of Transportation (Medical): Not on file  . Lack of Transportation (Non-Medical): Not on file  Physical Activity:   . Days of Exercise per Week: Not on file  . Minutes of Exercise per Session: Not on file  Stress:   . Feeling of Stress : Not on file  Social Connections:   . Frequency of Communication with Friends and Family: Not on file  . Frequency of Social Gatherings with Friends and Family: Not on file  . Attends Religious Services: Not on file  . Active Member of Clubs or Organizations: Not on file  . Attends Archivist Meetings: Not on file  . Marital Status: Not on file  Intimate Partner Violence:   . Fear of Current or Ex-Partner: Not on file  . Emotionally Abused: Not on file  . Physically Abused: Not on file  . Sexually Abused: Not on file    [name]'s family history includes COPD in her mother; Diabetes in her father and mother; Hypertension in her father and mother.      Objective:    Vitals:   08/13/19 1133  BP: 130/84  Pulse: 92  Temp: 98.3 F (36.8 C)  SpO2: 94%    Physical Exam Well-developed well-nourished 58 African-American female in no acute distress..  Pleasant.  Ambulating with a walker   Weight, 184 BMI 32.7  HEENT; nontraumatic normocephalic, EOMI, PER R LA, sclera anicteric. Oropharynx; not examined/mask/Covid Neck; supple, no JVD Cardiovascular; regular rate and rhythm with S1-S2, no murmur rub or gallop Pulmonary; Clear bilaterally Abdomen; soft, nontender, nondistended, no palpable mass or hepatosplenomegaly, bowel sounds are active Rectal; not done today Skin; benign exam, no jaundice rash or appreciable lesions Extremities; no clubbing cyanosis or edema skin warm and dry Neuro/Psych; alert and oriented x4, grossly nonfocal mood and affect appropriate       Assessment & Plan:   #22 58 year old African-American female referred for colon cancer screening.  Patient has no current GI symptoms #2+ family history of colon cancer in patient's mother diagnosed in her 96s #  3 coronary artery disease status post NSTEMI May 2020 with drug-eluting stent LAD. #4.  Dual antiplatelet therapy-on aspirin and Plavix #5 rhabdomyolysis related to statin September 2020-severe debilitation #6 old onset diabetes mellitus  Plan; Patient will need Colonoscopy, and will be established with Dr. Tarri Glenn.  I believe it is in her best interest to continue uninterrupted dual antiplatelet therapy for 1 year, prior to interrupting for colonoscopy.  Patient is asked to call back for follow-up appointment with myself in April or May 2021, and will schedule her for colonoscopy at that time assuming she is stable from a cardiac standpoint Kristianna Saperstein S Briani Maul PA-C 08/13/2019   Cc: Mardi Mainland,*

## 2019-08-13 NOTE — Patient Instructions (Addendum)
If you are age 56 or older, your body mass index should be between 23-30. Your Body mass index is 32.7 kg/m. If this is out of the aforementioned range listed, please consider follow up with your Primary Care Provider.  If you are age 76 or younger, your body mass index should be between 19-25. Your Body mass index is 32.7 kg/m. If this is out of the aformentioned range listed, please consider follow up with your Primary Care Provider.   Call back in April for appointment with Nicoletta Ba, PA-C for May 2021.  You have been scheduled for a colonoscopy. Please follow written instructions given to you at your visit today.  Please pick up your prep supplies at the pharmacy within the next 1-3 days. If you use inhalers (even only as needed), please bring them with you on the day of your procedure.

## 2019-08-14 ENCOUNTER — Ambulatory Visit: Payer: Medicaid Other | Admitting: Physical Therapy

## 2019-08-15 ENCOUNTER — Encounter

## 2019-08-21 ENCOUNTER — Ambulatory Visit: Payer: Medicaid Other | Admitting: Occupational Therapy

## 2019-08-26 ENCOUNTER — Ambulatory Visit: Payer: Medicaid Other | Admitting: Occupational Therapy

## 2019-08-27 ENCOUNTER — Ambulatory Visit: Payer: Medicaid Other | Attending: Family Medicine

## 2019-08-27 ENCOUNTER — Other Ambulatory Visit: Payer: Self-pay

## 2019-08-27 DIAGNOSIS — R262 Difficulty in walking, not elsewhere classified: Secondary | ICD-10-CM | POA: Insufficient documentation

## 2019-08-27 DIAGNOSIS — M6281 Muscle weakness (generalized): Secondary | ICD-10-CM | POA: Diagnosis present

## 2019-08-27 DIAGNOSIS — R2681 Unsteadiness on feet: Secondary | ICD-10-CM | POA: Diagnosis present

## 2019-08-27 NOTE — Therapy (Signed)
[facility] [address] [place], [place], [address] Phone: [phone]   Fax:  [phone]  Physical Therapy Treatment  Patient Details  Name: Krista [name] MRN: [id number] Date of Birth: [birth year] Referring Provider (PT): Willey Blade   Encounter Date: 08/27/2019  PT End of Session - 08/27/19 1702    Visit Number  13    Number of Visits  25    Date for PT Re-Evaluation  49/82/64   90 day cert but 60 day plan due to scheduling   Authorization Time Period  8 visits 06/29/1212/20; additional approved 4 visits 07/28/2019-08/10/2019, 12 visits 1/13-2/23/2021    Authorization - Visit Number  1    Authorization - Number of Visits  12    PT Start Time  1700    PT Stop Time  1739    PT Time Calculation (min)  39 min    Activity Tolerance  Patient tolerated treatment well;Patient limited by fatigue    Behavior During Therapy  [facility] for tasks assessed/performed       Past Medical History:  Diagnosis Date  . Arthritis   . CAD (coronary artery disease)   . Depression   . Diabetes mellitus   . Hypertension   . NSTEMI (non-ST elevated myocardial infarction) [facility])     Past Surgical History:  Procedure Laterality Date  . ANKLE SURGERY     left ankle - was broken  . CORONARY STENT INTERVENTION N/A 12/16/2018   Procedure: CORONARY STENT INTERVENTION;  Surgeon: Leonie Man, MD;  Location: [facility];  Service: Cardiovascular;  Laterality: N/A;  LAD  . LEFT HEART CATH AND CORONARY ANGIOGRAPHY N/A 12/16/2018   Procedure: LEFT HEART CATH AND CORONARY ANGIOGRAPHY;  Surgeon: Leonie Man, MD;  Location: [place] [facility];  Service: Cardiovascular;  Laterality: N/A;    There were no vitals filed for this visit.  Subjective Assessment - 08/27/19 1703    Subjective  Pt reports that knee pain has been doing better than it was but she stepped funny earlier today and right knee started bothering her again. She has  started sleeping less during the day only taking 30 min naps and getting better sleep at night. She is walking 5 min 2x/day currently in home.    Pertinent History  59 y/o female hospitalized 9/29 to 10/2 due to rhabdomyolysis. Presented with chest pain after fall.PMH: MI, CAD s/p stent placement, AKI, DM, HTN, hyperlipidemia    Patient Stated Goals  Pt would like to get better to be able to move easier with less pain and more energy.    Currently in Pain?  Yes    Pain Score  8     Pain Location  Knee    Pain Orientation  Right    Pain Descriptors / Indicators  Aching    Pain Type  Chronic pain    Pain Onset  More than a month ago                       [facility] Adult PT Treatment/Exercise - 08/27/19 1705      Ambulation/Gait   Ambulation/Gait  Yes    Ambulation/Gait Assistance  6: Modified independent (Device/Increase time)    Ambulation Distance (Feet)  130 Feet   215' x 1.    Assistive device  Rolling walker    Gait Pattern  Step-through pattern    Ambulation Surface  Level;Indoor    Gait Comments  Pt walked 1 min 30 sec first bout then after seated rest walked another 2 min 30 sec. Pt reported having to stop due to right knee pain.       Neuro Re-ed    Neuro Re-ed Details   Pt stood on foam pad x 30 sec eyes open and x 30 sec eyes closed. Pt had increased sway with eyes closed needing CGA for safety to help correct      Exercises   Exercises  Other Exercises    Other Exercises   In // bars: marching gait with bilateral UE support 6' x 4, reciprocal steps over 3 foam beams with UE support x 2 laps but had more step-to pattern toward end reporting increasing right knee pain, side stepping with bilateral UE support x 2 laps. Seated left LAQ 10 x 2 with 2# ankle weight, seated left hip flexion 10 x 2 with 2# ankle weight. Pt unable to perform on right due to knee pain. PT assessed knee with no increased pain along joint line palpation, good stability with A/P and P/A tibial  mobs, patella moving well in all directions. Pt was instructed to try some ice on right knee and elevate when gets home. Pt needed seated rest breaks between each activity.             PT Education - 08/27/19 1747    Education Details  Pt to coneinue with current HEP. Stressed importance of walking program gradually increasing time and continuing to work on improving her sleep schedule.    Person(s) Educated  Patient    Methods  Explanation    Comprehension  Verbalized understanding       PT Short Term Goals - 08/07/19 1514      PT SHORT TERM GOAL #1   Title  Pt will be independent with progressive HEP for strength, balance and functional mobility to continue gains on own.    Baseline  Pt has been performing her exercises everyday and they are starting to get easier.    Time  4    Period  Weeks    Status  Achieved    Target Date  08/15/19      PT SHORT TERM GOAL #2   Title  Pt will increase gait speed from 0.52 to >0.74ms for improved gait safety.    Baseline  0.518m on 07/16/2019, 08/04/2019 gait speed=0.6243m   Time  4    Period  Weeks    Status  Partially Met    Target Date  08/15/19      PT SHORT TERM GOAL #3   Title  Pt will decrease TUG form 21 sec to <18 sec for improved balance and decreased fall risk.    Baseline  08/04/2019 TUG=18.5 sec average of 2    Time  4    Period  Weeks    Status  Partially Met    Target Date  08/15/19      PT SHORT TERM GOAL #4   Title  Pt will ambulate >250' with FWW mod I for improved household and short community distances.    Baseline  180' mod I with FWW.; 115 ft x 3 reps with seated rest  breaks in between with RW 08/07/2019    Time  4    Period  Weeks    Status  Not Met    Target Date  08/15/19        PT Long Term Goals - 08/07/19 1514  PT LONG TERM GOAL #1   Title  Pt will increase 30 sec sit to stand to 7 or more for further improvement in functional strength and mobility.    Baseline  07/16/2019 pt performed  5 reps    Time  8    Period  Weeks    Status  Revised      PT LONG TERM GOAL #2   Title  Pt will increase gait speed from 0.56ms to >0.72 m/s for improved gait safety in house and community.    Baseline  07/16/2019 gait speed=0.551m. 08/04/2019 gait speed=0.6268m   Time  8    Period  Weeks    Status  Revised      PT LONG TERM GOAL #3   Title  Pt will decrease TUG to <15 sec for improved balance and decreased fall risk.    Baseline  07/16/2019 TUG=21 sec, 08/04/2019 TUG=18.5 sec    Time  8    Period  Weeks    Status  On-going      PT LONG TERM GOAL #4   Title  Pt will ambulate >400' on varied surfaces with SPC  mod I for improved gait safety.    Baseline  Varied distances 180-200' with RW mod I. Limited due to pain in knees and fatigue.    Time  8    Period  Weeks    Status  On-going      PT LONG TERM GOAL #5   Title  Pt will report <6/10 pain in knees with functional mobility in home for improved function.    Baseline  Not consistently meeting pain goal as varies day to day. Last week ranged from 8-6/10 in knees.    Time  8    Period  Weeks    Status  On-going            Plan - 08/27/19 1749    Clinical Impression Statement  Pt was able to increase gait distance some at visit today but is limited by fatigue and right knee pain. Needs frequent seated breaks during standing activities.    Personal Factors and Comorbidities  Comorbidity 3+    Examination-Activity Limitations  Locomotion Level;Transfers;Squat;Stairs;Stand    Examination-Participation Restrictions  Community Activity;Driving;Shop;Meal Prep    Stability/Clinical Decision Making  Evolving/Moderate complexity    Rehab Potential  Good    PT Frequency  2x / week    PT Duration  8 weeks    PT Treatment/Interventions  ADLs/Self Care Home Management;Aquatic Therapy;DME Instruction;Gait training;Stair training;Functional mobility training;Therapeutic activities;Therapeutic exercise;Balance training;Patient/family  education;Neuromuscular re-education;Energy conservation;Vestibular;Passive range of motion;Manual techniques    PT Next Visit Plan  update HEP as needed; work on improved balance, strength and gait training wtih improved posture.    PT Home Exercise Plan  Access Code: sit to stands plus [email or web address]    Consulted and Agree with Plan of Care  Patient       Patient will benefit from skilled therapeutic intervention in order to improve the following deficits and impairments:  Abnormal gait, Cardiopulmonary status limiting activity, Decreased activity tolerance, Decreased balance, Decreased strength, Decreased mobility, Decreased knowledge of use of DME, Impaired flexibility, Decreased endurance, Difficulty walking, Postural dysfunction, Decreased range of motion  Visit Diagnosis: Muscle weakness (generalized)  Difficulty in walking, not elsewhere classified     Problem List Patient Active Problem List   Diagnosis Date Noted  . Dyspnea   . Knee pain   . Rhabdomyolysis due to statin therapy   .  Demand ischemia ([place])   . Sepsis ([place]) 05/13/2019  . NSTEMI (non-ST elevated myocardial infarction) ([place]) 05/13/2019  . Coronary artery disease 02/12/2019  . Hyperlipidemia 02/12/2019  . Unstable angina ([place])   . Near syncope 12/14/2018  . Chest tightness 12/14/2018  . Prolonged Q-T interval on ECG   . Acute gastroenteritis 08/02/2012  . Dehydration 08/02/2012  . AKI (acute kidney injury) ([place]) 08/02/2012  . Hypokalemia 08/02/2012  . Diabetes mellitus ([place]) 08/02/2012  . HTN (hypertension) 08/02/2012  . Tobacco abuse 08/02/2012  . ALLERGIC RHINITIS WITH CONJUNCTIVITIS 10/21/2010  . ROTATOR CUFF SYNDROME, RIGHT 10/21/2010  . SEBACEOUS CYST, INFECTED 01/31/2010  . GUAIAC POSITIVE STOOL 10/05/2009  . TOBACCO ABUSE 09/21/2009  . RECTAL BLEEDING, HX OF 09/21/2009  . Nonspecific (abnormal) findings on radiological and other examination of body structure 05/19/2009  . RIGHT LOWER LUNG FIELD  CRACKLES 05/19/2009  . Diabetes ([place]) 02/12/2009  . Pain in limb 02/12/2009  . DENTAL CARIES 04/21/2008  . HYPERTENSION 05/01/2007    Electa Sniff, PT, DPT, NCS 08/27/2019, 5:52 PM  [facility] [address] [place], [place], [address] Phone: [phone]   Fax:  [phone]  Name: Krista [name] MRN: [id number] Date of Birth: 1961/01/09

## 2019-08-29 ENCOUNTER — Other Ambulatory Visit: Payer: Self-pay

## 2019-08-29 ENCOUNTER — Ambulatory Visit: Payer: Medicaid Other

## 2019-08-29 DIAGNOSIS — M6281 Muscle weakness (generalized): Secondary | ICD-10-CM

## 2019-08-29 DIAGNOSIS — R262 Difficulty in walking, not elsewhere classified: Secondary | ICD-10-CM

## 2019-08-29 NOTE — Therapy (Addendum)
[facility] [address] [place], [place], [address] Phone: [phone]   Fax:  [phone]  Physical Therapy Treatment  Patient Details  Name: Krista [name] MRN: [id number] Date of Birth: 21-Feb-1961 Referring Provider (PT): Willey Blade   Encounter Date: 08/29/2019  PT End of Session - 08/29/19 1105    Visit Number  14    Number of Visits  25    Date for PT Re-Evaluation  14/38/88   90 day cert but 60 day plan due to scheduling   Authorization Time Period  8 visits 06/29/1212/20; additional approved 4 visits 07/28/2019-08/10/2019, 12 visits 1/13-2/23/2021    Authorization - Visit Number  2    Authorization - Number of Visits  12    PT Start Time  1101    PT Stop Time  1140    PT Time Calculation (min)  39 min    Activity Tolerance  Patient tolerated treatment well;Patient limited by fatigue    Behavior During Therapy  [facility] for tasks assessed/performed       Past Medical History:  Diagnosis Date  . Arthritis   . CAD (coronary artery disease)   . Depression   . Diabetes mellitus   . Hypertension   . NSTEMI (non-ST elevated myocardial infarction) [facility])     Past Surgical History:  Procedure Laterality Date  . ANKLE SURGERY     left ankle - was broken  . CORONARY STENT INTERVENTION N/A 12/16/2018   Procedure: CORONARY STENT INTERVENTION;  Surgeon: Leonie Man, MD;  Location: [place] [facility];  Service: Cardiovascular;  Laterality: N/A;  LAD  . LEFT HEART CATH AND CORONARY ANGIOGRAPHY N/A 12/16/2018   Procedure: LEFT HEART CATH AND CORONARY ANGIOGRAPHY;  Surgeon: Leonie Man, MD;  Location: [facility];  Service: Cardiovascular;  Laterality: N/A;    There were no vitals filed for this visit.  Subjective Assessment - 08/29/19 1105    Subjective  Pt reports that she continues to have pain in knees mostly on right that limits her.    Pertinent History  59 y/o female hospitalized 9/29 to 10/2  due to rhabdomyolysis. Presented with chest pain after fall.PMH: MI, CAD s/p stent placement, AKI, DM, HTN, hyperlipidemia    Patient Stated Goals  Pt would like to get better to be able to move easier with less pain and more energy.    Currently in Pain?  Yes    Pain Score  8     Pain Location  Knee    Pain Orientation  Right    Pain Descriptors / Indicators  Aching    Pain Type  Chronic pain    Pain Onset  More than a month ago    Pain Frequency  Constant                       [facility] Adult PT Treatment/Exercise - 08/29/19 1107      Transfers   Transfers  Sit to Stand;Stand to Sit    Sit to Stand  6: Modified independent (Device/Increase time)    Stand to Sit  6: Modified independent (Device/Increase time)      Ambulation/Gait   Ambulation/Gait  Yes    Ambulation/Gait Assistance  6: Modified independent (Device/Increase time)    Ambulation Distance (Feet)  195 Feet    Assistive device  Rolling walker    Gait Pattern  Step-through pattern    Ambulation Surface  Level;Indoor  Gait velocity  0.64 m/s    Gait Comments  Pt reported having to stop due to fatigue/pain in legs      Standardized Balance Assessment   Standardized Balance Assessment  Timed Up and Go Test      Timed Up and Go Test   TUG  Normal TUG    Normal TUG (seconds)  20   2nd attempt 18 sec with RW     Neuro Re-ed    Neuro Re-ed Details   In // bars: gait with 1 UE support 6' x 4, marching gait 6' x 4 with bilateral UE support, side stepping 6' x 4 with UE support, gait walking over mat with 1 UE support 6' x 4. Pt needed 3 seated rest breaks during activities due to pain.      Exercises   Exercises  Other Exercises    Other Exercises   Seated LAQ 10 x 2 bilateral with 2# on left leg, seated march 10 x 2 bilateral with 2# left. Pt has decreased range on right due to pain in knee. Seated hip abd x 20 with manual resistance, seated hip adduction with manual resistance 10 x 2.  Sit to stand x 5 from  edge of mat. PT provided education on importance of movement to help "lubricate" our joints. Explained how if she does not move then joints will hurt more when she does as they will stiffen up.             PT Education - 08/29/19 1159    Education Details  Pt to continue with current HEP    Person(s) Educated  Patient    Methods  Explanation    Comprehension  Verbalized understanding       PT Short Term Goals - 08/29/19 1200      PT SHORT TERM GOAL #1   Title  Pt will be independent with progressive HEP for strength, balance and functional mobility to continue gains on own.    Baseline  Pt has been performing her exercises everyday and they are starting to get easier.    Time  4    Period  Weeks    Status  Achieved    Target Date  08/15/19      PT SHORT TERM GOAL #2   Title  Pt will increase gait speed from 0.52 to >0.82ms for improved gait safety.    Baseline  0.561m on 07/16/2019, 08/04/2019 gait speed=0.6267m 08/29/19 gait speed=0.61m6m  Time  4    Period  Weeks    Status  Achieved    Target Date  08/15/19      PT SHORT TERM GOAL #3   Title  Pt will decrease TUG form 21 sec to <18 sec for improved balance and decreased fall risk.    Baseline  08/04/2019 TUG=18.5 sec average of 2, 08/29/19 TUG average=19 sec with RW    Time  4    Period  Weeks    Status  Partially Met    Target Date  08/15/19      PT SHORT TERM GOAL #4   Title  Pt will ambulate >250' with FWW mod I for improved household and short community distances.    Baseline  180' mod I with FWW.; 115 ft x 3 reps with seated rest  breaks in between with RW 08/07/2019    Time  4    Period  Weeks    Status  Not Met  Target Date  08/15/19        PT Long Term Goals - 08/29/19 1121      PT LONG TERM GOAL #1   Title  Pt will increase 30 sec sit to stand to 7 or more for further improvement in functional strength and mobility.    Baseline  07/16/2019 pt performed 5 reps    Time  8    Period  Weeks     Status  Revised      PT LONG TERM GOAL #2   Title  Pt will increase gait speed from 0.66ms to >0.72 m/s for improved gait safety in house and community.    Baseline  07/16/2019 gait speed=0.542m. 08/04/2019 gait speed=0.6255m   Time  8    Period  Weeks    Status  Revised      PT LONG TERM GOAL #3   Title  Pt will decrease TUG to <15 sec for improved balance and decreased fall risk.    Baseline  07/16/2019 TUG=21 sec, 08/04/2019 TUG=18.5 sec    Time  8    Period  Weeks    Status  On-going      PT LONG TERM GOAL #4   Title  Pt will ambulate >400' on varied surfaces with SPC  mod I for improved gait safety.    Baseline  Varied distances 180-200' with RW mod I. Limited due to pain in knees and fatigue.    Time  8    Period  Weeks    Status  On-going      PT LONG TERM GOAL #5   Title  Pt will report <6/10 pain in knees with functional mobility in home for improved function.    Baseline  Not consistently meeting pain goal as varies day to day. Last week ranged from 8-6/10 in knees.    Time  8    Period  Weeks    Status  On-going            Plan - 08/29/19 1203    Clinical Impression Statement  Pt continues to be limited by pain and fatigue. Pt did not wish to try NuStep again stating that it hurt her knees too much. Does not wish to try aquatics. PT encouraged to continue to increase her activity level.    Personal Factors and Comorbidities  Comorbidity 3+    Examination-Activity Limitations  Locomotion Level;Transfers;Squat;Stairs;Stand    Examination-Participation Restrictions  Community Activity;Driving;Shop;Meal Prep    Stability/Clinical Decision Making  Evolving/Moderate complexity    Rehab Potential  Good    PT Frequency  2x / week    PT Duration  8 weeks    PT Treatment/Interventions  ADLs/Self Care Home Management;Aquatic Therapy;DME Instruction;Gait training;Stair training;Functional mobility training;Therapeutic activities;Therapeutic exercise;Balance  training;Patient/family education;Neuromuscular re-education;Energy conservation;Vestibular;Passive range of motion;Manual techniques    PT Next Visit Plan  update HEP as needed; work on improved balance, strength and gait training wtih improved posture.    PT Home Exercise Plan  Access Code: sit to stands plus [email or web address]    Consulted and Agree with Plan of Care  Patient       Patient will benefit from skilled therapeutic intervention in order to improve the following deficits and impairments:  Abnormal gait, Cardiopulmonary status limiting activity, Decreased activity tolerance, Decreased balance, Decreased strength, Decreased mobility, Decreased knowledge of use of DME, Impaired flexibility, Decreased endurance, Difficulty walking, Postural dysfunction, Decreased range of motion  Visit Diagnosis: Muscle weakness (generalized)  Difficulty in  walking, not elsewhere classified     Problem List Patient Active Problem List   Diagnosis Date Noted  . Dyspnea   . Knee pain   . Rhabdomyolysis due to statin therapy   . Demand ischemia ([place])   . Sepsis ([place]) 05/13/2019  . NSTEMI (non-ST elevated myocardial infarction) ([place]) 05/13/2019  . Coronary artery disease 02/12/2019  . Hyperlipidemia 02/12/2019  . Unstable angina ([place])   . Near syncope 12/14/2018  . Chest tightness 12/14/2018  . Prolonged Q-T interval on ECG   . Acute gastroenteritis 08/02/2012  . Dehydration 08/02/2012  . AKI (acute kidney injury) ([place]) 08/02/2012  . Hypokalemia 08/02/2012  . Diabetes mellitus ([place]) 08/02/2012  . HTN (hypertension) 08/02/2012  . Tobacco abuse 08/02/2012  . ALLERGIC RHINITIS WITH CONJUNCTIVITIS 10/21/2010  . ROTATOR CUFF SYNDROME, RIGHT 10/21/2010  . SEBACEOUS CYST, INFECTED 01/31/2010  . GUAIAC POSITIVE STOOL 10/05/2009  . TOBACCO ABUSE 09/21/2009  . RECTAL BLEEDING, HX OF 09/21/2009  . Nonspecific (abnormal) findings on radiological and other examination of body structure 05/19/2009  .  RIGHT LOWER LUNG FIELD CRACKLES 05/19/2009  . Diabetes ([place]) 02/12/2009  . Pain in limb 02/12/2009  . DENTAL CARIES 04/21/2008  . HYPERTENSION 05/01/2007    Electa Sniff, PT, DPT, NCS 08/29/2019, 12:04 PM  [facility] [address] [place], [place], [address] Phone: [phone]   Fax:  [phone]  Name: BESS SALTZMAN MRN: [id number] Date of Birth: August 02, 1961

## 2019-09-03 ENCOUNTER — Ambulatory Visit: Payer: Medicaid Other | Admitting: Physical Therapy

## 2019-09-03 ENCOUNTER — Other Ambulatory Visit: Payer: Self-pay

## 2019-09-03 DIAGNOSIS — M6281 Muscle weakness (generalized): Secondary | ICD-10-CM

## 2019-09-03 DIAGNOSIS — R2681 Unsteadiness on feet: Secondary | ICD-10-CM

## 2019-09-03 DIAGNOSIS — R262 Difficulty in walking, not elsewhere classified: Secondary | ICD-10-CM

## 2019-09-03 NOTE — Therapy (Signed)
[facility] [address] [place], [place], [address] Phone: [phone]   Fax:  [phone]  Physical Therapy Treatment  Patient Details  Name: Krista [name] MRN: [id number] Date of Birth: 1961-05-29 Referring Provider (PT): Willey Blade   Encounter Date: 09/03/2019  PT End of Session - 09/03/19 5625    Visit Number  15    Number of Visits  25    Date for PT Re-Evaluation  63/89/37   90 day cert but 60 day plan due to scheduling   Authorization Time Period  8 visits 06/29/1212/20; additional approved 4 visits 07/28/2019-08/10/2019, 12 visits 1/13-2/23/2021    Authorization - Visit Number  3    Authorization - Number of Visits  12    PT Start Time  3428    PT Stop Time  1612    PT Time Calculation (min)  38 min    Activity Tolerance  Patient tolerated treatment well;Patient limited by fatigue    Behavior During Therapy  [facility] for tasks assessed/performed       Past Medical History:  Diagnosis Date  . Arthritis   . CAD (coronary artery disease)   . Depression   . Diabetes mellitus   . Hypertension   . NSTEMI (non-ST elevated myocardial infarction) [facility])     Past Surgical History:  Procedure Laterality Date  . ANKLE SURGERY     left ankle - was broken  . CORONARY STENT INTERVENTION N/A 12/16/2018   Procedure: CORONARY STENT INTERVENTION;  Surgeon: Leonie Man, MD;  Location: [facility];  Service: Cardiovascular;  Laterality: N/A;  LAD  . LEFT HEART CATH AND CORONARY ANGIOGRAPHY N/A 12/16/2018   Procedure: LEFT HEART CATH AND CORONARY ANGIOGRAPHY;  Surgeon: Leonie Man, MD;  Location: [facility] [facility];  Service: Cardiovascular;  Laterality: N/A;    There were no vitals filed for this visit.  Subjective Assessment - 09/03/19 1537    Subjective  Feeling okay today, just my knees are sore.  (Later in session, pt reports that she fell yesterday-unsure how she fell; she was able to to get up  from the floor on her own).    Pertinent History  59 y/o female hospitalized 9/29 to 10/2 due to rhabdomyolysis. Presented with chest pain after fall.PMH: MI, CAD s/p stent placement, AKI, DM, HTN, hyperlipidemia    Patient Stated Goals  Pt would like to get better to be able to move easier with less pain and more energy.    Currently in Pain?  Yes    Pain Score  8     Pain Location  Knee    Pain Orientation  Right    Pain Descriptors / Indicators  Aching    Pain Type  Chronic pain    Pain Onset  More than a month ago    Pain Frequency  Constant    Aggravating Factors   not sure, hurts all the time    Pain Relieving Factors  exercises                       [facility] Adult PT Treatment/Exercise - 09/03/19 0001      Transfers   Transfers  Sit to Stand;Stand to Sit    Sit to Stand  6: Modified independent (Device/Increase time)    Stand to Sit  6: Modified independent (Device/Increase time)    Number of Reps  --   5 reps throughout session  Ambulation/Gait   Ambulation/Gait  Yes    Ambulation/Gait Assistance  6: Modified independent (Device/Increase time)    Ambulation Distance (Feet)  175 Feet   60 ft   Assistive device  Rolling walker    Gait Pattern  Step-through pattern    Ambulation Surface  Level;Indoor    Gait Comments  Cues for upright posture and lessen UE support on RW.  Cues for foot clearance, heelstrike with gait.  Pt requests to stop gait due to fatigue, pain in knees      Knee/Hip Exercises: Seated   Long Arc Quad  AROM;Right;Left;2 sets;10 reps    Long Arc Quad Weight  2 lbs.   LLE; 1st set; RLE both sets and LLE 2nd set no weight   Heel Slides  Strengthening;AROM;Right;Left;2 sets;10 reps   pillow case under foot   Other Seated Knee/Hip Exercises  Seated heel digs/glut sets, 2 sets x 5 reps (cues for technique, for seated hip strengthening)    Marching  AROM;Right;Left;1 set;10 reps    Marching Weights  2 lbs.          Balance Exercises -  09/03/19 1603      Balance Exercises: Standing   Standing Eyes Opened  Wide (BOA);Narrow base of support (BOS);Solid surface;Head turns;5 reps   Head nods   Standing Eyes Closed  Wide (BOA);Narrow base of support (BOS);Solid surface;1 rep;10 secs   increased sway, needs UE support and min guard   Retro Gait  Upper extremity support;3 reps   Forward/back walking in parallel bars   Marching  Solid surface;Upper extremity assist 2;5 reps   Marching in place   Other Standing Exercises  Alternating step taps to 4" block x 5 reps, 2 sets; then alternating heel raises x 10 reps          PT Short Term Goals - 08/29/19 1200      PT SHORT TERM GOAL #1   Title  Pt will be independent with progressive HEP for strength, balance and functional mobility to continue gains on own.    Baseline  Pt has been performing her exercises everyday and they are starting to get easier.    Time  4    Period  Weeks    Status  Achieved    Target Date  08/15/19      PT SHORT TERM GOAL #2   Title  Pt will increase gait speed from 0.52 to >0.39ms for improved gait safety.    Baseline  0.527m on 07/16/2019, 08/04/2019 gait speed=0.6268m 08/29/19 gait speed=0.50m52m  Time  4    Period  Weeks    Status  Achieved    Target Date  08/15/19      PT SHORT TERM GOAL #3   Title  Pt will decrease TUG form 21 sec to <18 sec for improved balance and decreased fall risk.    Baseline  08/04/2019 TUG=18.5 sec average of 2, 08/29/19 TUG average=19 sec with RW    Time  4    Period  Weeks    Status  Partially Met    Target Date  08/15/19      PT SHORT TERM GOAL #4   Title  Pt will ambulate >250' with FWW mod I for improved household and short community distances.    Baseline  180' mod I with FWW.; 115 ft x 3 reps with seated rest  breaks in between with RW 08/07/2019    Time  4    Period  Weeks    Status  Not Met    Target Date  08/15/19        PT Long Term Goals - 08/29/19 1121      PT LONG TERM GOAL #1    Title  Pt will increase 30 sec sit to stand to 7 or more for further improvement in functional strength and mobility.    Baseline  07/16/2019 pt performed 5 reps    Time  8    Period  Weeks    Status  Revised      PT LONG TERM GOAL #2   Title  Pt will increase gait speed from 0.28ms to >0.72 m/s for improved gait safety in house and community.    Baseline  07/16/2019 gait speed=0.572m. 08/04/2019 gait speed=0.6225m   Time  8    Period  Weeks    Status  Revised      PT LONG TERM GOAL #3   Title  Pt will decrease TUG to <15 sec for improved balance and decreased fall risk.    Baseline  07/16/2019 TUG=21 sec, 08/04/2019 TUG=18.5 sec    Time  8    Period  Weeks    Status  On-going      PT LONG TERM GOAL #4   Title  Pt will ambulate >400' on varied surfaces with SPC  mod I for improved gait safety.    Baseline  Varied distances 180-200' with RW mod I. Limited due to pain in knees and fatigue.    Time  8    Period  Weeks    Status  On-going      PT LONG TERM GOAL #5   Title  Pt will report <6/10 pain in knees with functional mobility in home for improved function.    Baseline  Not consistently meeting pain goal as varies day to day. Last week ranged from 8-6/10 in knees.    Time  8    Period  Weeks    Status  On-going            Plan - 09/03/19 1640    Clinical Impression Statement  Pt reports knee pain and fatigue throughout session, requesting seated rest breaks.  During seated breaks, PT has pt perform lower extremity strengthening exercises.  With standing exercises in parallel bars, cues provided for use of mirror to help with upright posture; with knee flexion and SLS activities in bars, pt c/o increased knee pain.  Pt will continue to benefit from skileld PT to further progress strength, balance, overall mobility.    Personal Factors and Comorbidities  Comorbidity 3+    Examination-Activity Limitations  Locomotion Level;Transfers;Squat;Stairs;Stand     Examination-Participation Restrictions  Community Activity;Driving;Shop;Meal Prep    Stability/Clinical Decision Making  Evolving/Moderate complexity    Rehab Potential  Good    PT Frequency  2x / week    PT Duration  8 weeks    PT Treatment/Interventions  ADLs/Self Care Home Management;Aquatic Therapy;DME Instruction;Gait training;Stair training;Functional mobility training;Therapeutic activities;Therapeutic exercise;Balance training;Patient/family education;Neuromuscular re-education;Energy conservation;Vestibular;Passive range of motion;Manual techniques    PT Next Visit Plan  update HEP as needed; continue to work on improved balance, strength and gait training wtih improved posture.    PT Home Exercise Plan  Access Code: sit to stands plus [email or web address]    Consulted and Agree with Plan of Care  Patient       Patient will benefit from skilled therapeutic intervention in order to improve the following deficits and  impairments:  Abnormal gait, Cardiopulmonary status limiting activity, Decreased activity tolerance, Decreased balance, Decreased strength, Decreased mobility, Decreased knowledge of use of DME, Impaired flexibility, Decreased endurance, Difficulty walking, Postural dysfunction, Decreased range of motion  Visit Diagnosis: Muscle weakness (generalized)  Unsteadiness on feet  Difficulty in walking, not elsewhere classified     Problem List Patient Active Problem List   Diagnosis Date Noted  . Dyspnea   . Knee pain   . Rhabdomyolysis due to statin therapy   . Demand ischemia ([place])   . Sepsis ([place]) 05/13/2019  . NSTEMI (non-ST elevated myocardial infarction) ([place]) 05/13/2019  . Coronary artery disease 02/12/2019  . Hyperlipidemia 02/12/2019  . Unstable angina ([place])   . Near syncope 12/14/2018  . Chest tightness 12/14/2018  . Prolonged Q-T interval on ECG   . Acute gastroenteritis 08/02/2012  . Dehydration 08/02/2012  . AKI (acute kidney injury) ([place]) 08/02/2012  .  Hypokalemia 08/02/2012  . Diabetes mellitus ([place]) 08/02/2012  . HTN (hypertension) 08/02/2012  . Tobacco abuse 08/02/2012  . ALLERGIC RHINITIS WITH CONJUNCTIVITIS 10/21/2010  . ROTATOR CUFF SYNDROME, RIGHT 10/21/2010  . SEBACEOUS CYST, INFECTED 01/31/2010  . GUAIAC POSITIVE STOOL 10/05/2009  . TOBACCO ABUSE 09/21/2009  . RECTAL BLEEDING, HX OF 09/21/2009  . Nonspecific (abnormal) findings on radiological and other examination of body structure 05/19/2009  . RIGHT LOWER LUNG FIELD CRACKLES 05/19/2009  . Diabetes ([place]) 02/12/2009  . Pain in limb 02/12/2009  . DENTAL CARIES 04/21/2008  . HYPERTENSION 05/01/2007    Annalina Needles W. 09/03/2019, 4:43 PM  Frazier Butt., PT  [facility] [address] [place], [place], [address] Phone: [phone]   Fax:  [phone]  Name: TONESHA TSOU MRN: [id number] Date of Birth: 06-06-1961

## 2019-09-04 ENCOUNTER — Ambulatory Visit: Payer: Medicaid Other

## 2019-09-04 DIAGNOSIS — M6281 Muscle weakness (generalized): Secondary | ICD-10-CM | POA: Diagnosis not present

## 2019-09-04 DIAGNOSIS — R262 Difficulty in walking, not elsewhere classified: Secondary | ICD-10-CM

## 2019-09-04 NOTE — Therapy (Signed)
[facility] [address] [place], [place], [address] Phone: [phone]   Fax:  [phone]  Physical Therapy Treatment  Patient Details  Name: DULCIE GAMMON MRN: [id number] Date of Birth: Aug 22, 1960 Referring Provider (PT): Willey Blade   Encounter Date: 09/04/2019  PT End of Session - 09/04/19 1104    Visit Number  16    Number of Visits  25    Date for PT Re-Evaluation  09/47/09   90 day cert but 60 day plan due to scheduling   Authorization Time Period  8 visits 06/29/1212/20; additional approved 4 visits 07/28/2019-08/10/2019, 12 visits 1/13-2/23/2021    Authorization - Visit Number  4    Authorization - Number of Visits  12    PT Start Time  1101    PT Stop Time  1141    PT Time Calculation (min)  40 min    Activity Tolerance  Patient tolerated treatment well;Patient limited by fatigue    Behavior During Therapy  [facility] for tasks assessed/performed       Past Medical History:  Diagnosis Date  . Arthritis   . CAD (coronary artery disease)   . Depression   . Diabetes mellitus   . Hypertension   . NSTEMI (non-ST elevated myocardial infarction) [facility])     Past Surgical History:  Procedure Laterality Date  . ANKLE SURGERY     left ankle - was broken  . CORONARY STENT INTERVENTION N/A 12/16/2018   Procedure: CORONARY STENT INTERVENTION;  Surgeon: Leonie Man, MD;  Location: [place] [facility];  Service: Cardiovascular;  Laterality: N/A;  LAD  . LEFT HEART CATH AND CORONARY ANGIOGRAPHY N/A 12/16/2018   Procedure: LEFT HEART CATH AND CORONARY ANGIOGRAPHY;  Surgeon: Leonie Man, MD;  Location: [place] [facility];  Service: Cardiovascular;  Laterality: N/A;    There were no vitals filed for this visit.  Subjective Assessment - 09/04/19 1104    Subjective  Pt reports that she is doing ok. Knees not quite a sore. No new falls since the other day.    Pertinent History  59 y/o female hospitalized 9/29  to 10/2 due to rhabdomyolysis. Presented with chest pain after fall.PMH: MI, CAD s/p stent placement, AKI, DM, HTN, hyperlipidemia    Patient Stated Goals  Pt would like to get better to be able to move easier with less pain and more energy.    Currently in Pain?  Yes    Pain Score  7     Pain Location  Knee    Pain Orientation  Right;Left    Pain Descriptors / Indicators  Aching    Pain Type  Chronic pain    Pain Onset  More than a month ago                       [facility] Adult PT Treatment/Exercise - 09/04/19 1104      Transfers   Transfers  Sit to Stand;Stand to Sit      Ambulation/Gait   Ambulation/Gait  Yes    Ambulation/Gait Assistance  6: Modified independent (Device/Increase time)    Ambulation Distance (Feet)  145 Feet   First bout 1 min 40 sec, 2nd bout 2 min 10 sec 215'   Assistive device  Rolling walker    Gait Pattern  Step-through pattern    Ambulation Surface  Level;Indoor    Gait Comments  At end of session pt ambulated another 115' x  2 with RW mod I with seated rest break in between      Neuro Re-ed    Neuro Re-ed Details   Standing at walker: 30 sec without UE support. Pt had some sway posterior at times. Standing with 1 UE support reaching for targets x 30 sec each UE, standing without UE support eyes closed x 30 sec. Standing on airex without UE support 30 sec x 2, marching on airex with support of walker BLE. Standing on airex tossing 6 birdies towards basket with only 1 UE support x 3.  Standing on airex raising ball to 90 degrees shoulder flexion with both arms x 10 then holding ball in front and rotating side to side x 10. CGA for safety with activities.       Exercises   Exercises  Other Exercises    Other Exercises   Standing at walker: marching in place x 10 BLE, hamstring curl x 10 BLE, seated rest break then hip abd x 10 BLE. Pt needed to rest again after this.              PT Education - 09/04/19 1334    Education Details  Pt to  continue with current HEP. Educated on importance of trying to increase walking time at home.    Person(s) Educated  Patient    Methods  Explanation    Comprehension  Verbalized understanding       PT Short Term Goals - 08/29/19 1200      PT SHORT TERM GOAL #1   Title  Pt will be independent with progressive HEP for strength, balance and functional mobility to continue gains on own.    Baseline  Pt has been performing her exercises everyday and they are starting to get easier.    Time  4    Period  Weeks    Status  Achieved    Target Date  08/15/19      PT SHORT TERM GOAL #2   Title  Pt will increase gait speed from 0.52 to >0.63ms for improved gait safety.    Baseline  0.574m on 07/16/2019, 08/04/2019 gait speed=0.6294m 08/29/19 gait speed=0.63m77m  Time  4    Period  Weeks    Status  Achieved    Target Date  08/15/19      PT SHORT TERM GOAL #3   Title  Pt will decrease TUG form 21 sec to <18 sec for improved balance and decreased fall risk.    Baseline  08/04/2019 TUG=18.5 sec average of 2, 08/29/19 TUG average=19 sec with RW    Time  4    Period  Weeks    Status  Partially Met    Target Date  08/15/19      PT SHORT TERM GOAL #4   Title  Pt will ambulate >250' with FWW mod I for improved household and short community distances.    Baseline  180' mod I with FWW.; 115 ft x 3 reps with seated rest  breaks in between with RW 08/07/2019    Time  4    Period  Weeks    Status  Not Met    Target Date  08/15/19        PT Long Term Goals - 08/29/19 1121      PT LONG TERM GOAL #1   Title  Pt will increase 30 sec sit to stand to 7 or more for further improvement in functional strength and mobility.  Baseline  07/16/2019 pt performed 5 reps    Time  8    Period  Weeks    Status  Revised      PT LONG TERM GOAL #2   Title  Pt will increase gait speed from 0.64ms to >0.72 m/s for improved gait safety in house and community.    Baseline  07/16/2019 gait speed=0.554m.  08/04/2019 gait speed=0.6220m   Time  8    Period  Weeks    Status  Revised      PT LONG TERM GOAL #3   Title  Pt will decrease TUG to <15 sec for improved balance and decreased fall risk.    Baseline  07/16/2019 TUG=21 sec, 08/04/2019 TUG=18.5 sec    Time  8    Period  Weeks    Status  On-going      PT LONG TERM GOAL #4   Title  Pt will ambulate >400' on varied surfaces with SPC  mod I for improved gait safety.    Baseline  Varied distances 180-200' with RW mod I. Limited due to pain in knees and fatigue.    Time  8    Period  Weeks    Status  On-going      PT LONG TERM GOAL #5   Title  Pt will report <6/10 pain in knees with functional mobility in home for improved function.    Baseline  Not consistently meeting pain goal as varies day to day. Last week ranged from 8-6/10 in knees.    Time  8    Period  Weeks    Status  On-going            Plan - 09/04/19 1334    Clinical Impression Statement  Pt was able to increase gait bouts today with longest distance at one time noted. PT able to get patient to perform more standing activities with seated rest break spread out throughout.    Personal Factors and Comorbidities  Comorbidity 3+    Examination-Activity Limitations  Locomotion Level;Transfers;Squat;Stairs;Stand    Examination-Participation Restrictions  Community Activity;Driving;Shop;Meal Prep    Stability/Clinical Decision Making  Evolving/Moderate complexity    Rehab Potential  Good    PT Frequency  2x / week    PT Duration  8 weeks    PT Treatment/Interventions  ADLs/Self Care Home Management;Aquatic Therapy;DME Instruction;Gait training;Stair training;Functional mobility training;Therapeutic activities;Therapeutic exercise;Balance training;Patient/family education;Neuromuscular re-education;Energy conservation;Vestibular;Passive range of motion;Manual techniques    PT Next Visit Plan  update HEP as needed; continue to work on improved balance, strength and gait  training wtih improved posture.    PT Home Exercise Plan  Access Code: sit to stands plus [email or web address]    Consulted and Agree with Plan of Care  Patient       Patient will benefit from skilled therapeutic intervention in order to improve the following deficits and impairments:  Abnormal gait, Cardiopulmonary status limiting activity, Decreased activity tolerance, Decreased balance, Decreased strength, Decreased mobility, Decreased knowledge of use of DME, Impaired flexibility, Decreased endurance, Difficulty walking, Postural dysfunction, Decreased range of motion  Visit Diagnosis: Muscle weakness (generalized)  Difficulty in walking, not elsewhere classified     Problem List Patient Active Problem List   Diagnosis Date Noted  . Dyspnea   . Knee pain   . Rhabdomyolysis due to statin therapy   . Demand ischemia ([place] . Sepsis ([place]  . NSTEMI (non-ST elevated myocardial infarction) ([place]/2020  . Coronary artery disease  02/12/2019  . Hyperlipidemia 02/12/2019  . Unstable angina ([place])   . Near syncope 12/14/2018  . Chest tightness 12/14/2018  . Prolonged Q-T interval on ECG   . Acute gastroenteritis 08/02/2012  . Dehydration 08/02/2012  . AKI (acute kidney injury) ([place]) 08/02/2012  . Hypokalemia 08/02/2012  . Diabetes mellitus ([place]) 08/02/2012  . HTN (hypertension) 08/02/2012  . Tobacco abuse 08/02/2012  . ALLERGIC RHINITIS WITH CONJUNCTIVITIS 10/21/2010  . ROTATOR CUFF SYNDROME, RIGHT 10/21/2010  . SEBACEOUS CYST, INFECTED 01/31/2010  . GUAIAC POSITIVE STOOL 10/05/2009  . TOBACCO ABUSE 09/21/2009  . RECTAL BLEEDING, HX OF 09/21/2009  . Nonspecific (abnormal) findings on radiological and other examination of body structure 05/19/2009  . RIGHT LOWER LUNG FIELD CRACKLES 05/19/2009  . Diabetes ([place]) 02/12/2009  . Pain in limb 02/12/2009  . DENTAL CARIES 04/21/2008  . HYPERTENSION 05/01/2007    Electa Sniff, PT, DPT, NCS 09/04/2019, 1:36 PM  [facility] [address] [place], [place], [address] Phone: [phone]   Fax:  [phone]  Name: GENECIS VELEY MRN: [id number] Date of Birth: [birth year]

## 2019-09-05 ENCOUNTER — Encounter: Payer: Self-pay | Admitting: Cardiovascular Disease

## 2019-09-05 ENCOUNTER — Ambulatory Visit (INDEPENDENT_AMBULATORY_CARE_PROVIDER_SITE_OTHER): Payer: Medicaid Other | Admitting: Cardiovascular Disease

## 2019-09-05 ENCOUNTER — Other Ambulatory Visit: Payer: Self-pay

## 2019-09-05 DIAGNOSIS — I10 Essential (primary) hypertension: Secondary | ICD-10-CM

## 2019-09-05 DIAGNOSIS — I2510 Atherosclerotic heart disease of native coronary artery without angina pectoris: Secondary | ICD-10-CM | POA: Diagnosis not present

## 2019-09-05 DIAGNOSIS — E782 Mixed hyperlipidemia: Secondary | ICD-10-CM

## 2019-09-05 DIAGNOSIS — F17200 Nicotine dependence, unspecified, uncomplicated: Secondary | ICD-10-CM

## 2019-09-05 MED ORDER — CLOPIDOGREL BISULFATE 75 MG PO TABS: 75.0000 mg | ORAL_TABLET | Freq: Every day | ORAL | 3 refills | Status: DC

## 2019-09-05 NOTE — Patient Instructions (Signed)
Medication Instructions:  Your physician recommends that you continue on your current medications as directed. Please refer to the Current Medication list given to you today.  If you need a refill on your cardiac medications before your next appointment, please call your pharmacy.   Lab work: Fasting Lipid and hepatic function If you have labs (blood work) drawn today and your tests are completely normal, you will receive your results only by: MyChart Message (if you have MyChart) OR A paper copy in the mail If you have any lab test that is abnormal or we need to change your treatment, we will call you to review the results.  Testing/Procedures: NONE  Follow-Up: At [facility], you and your health needs are our priority.  As part of our continuing mission to provide you with exceptional heart care, we have created designated Provider Care Teams.  These Care Teams include your primary Cardiologist (physician) and Advanced Practice Providers (APPs -  Physician Assistants and Nurse Practitioners) who all work together to provide you with the care you need, when you need it. You may see Quay Burow, MD or one of the following Advanced Practice Providers on your designated Care Team:    Kerin Ransom, PA-C  [place], [place]  Coletta Memos, [place]  Your physician wants you to follow-up in: 6 months with Almyra Deforest, FNP Your physician wants you to follow-up in: 1 year with Dr. Gwenlyn Found

## 2019-09-05 NOTE — Progress Notes (Signed)
09/05/2019 RADONNA TAIT   03-07-1961  RR:[id number]  Primary Physician Lang Snow, FNP Primary Cardiologist: Lorretta Harp MD [name], [place]  HPI:  GWENDLOYN [name] is a 59 y.o. 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    I last saw her in the office 02/12/2019.[name]  Her risk factors include tobacco abuse, treated hypertension, diabetes and hyperlipidemia.  She presented on 12/13/2018 with a non-STEMI and underwent cardiac catheterization 3 days later by Dr. Ellyn Hack revealing high-grade proximal LAD disease which was stented with a synergy drug-eluting stent.  She did have high-grade diffuse diagonal branch disease, ramus branch and RCA disease all of which were treated medically.  She was discharged home the following day on dual antiplatelet therapy including aspirin and Brilinta and has remained pain-free since that time although she does continue to smoke 1 pack/week.  She was admitted in September with sepsis and had rhabdomyolysis.  Enzymes were mildly elevated as well and she was seen by Dr. Burt Knack who thought this was related to "demand ischemia".  In ischemic evaluation was not pursued.  She is on Plavix and aspirin currently.  She denies chest pain or shortness of breath.  Because of rhabdomyolysis it was recommended that she switch from statin therapy which she was on (pravastatin) to a PCSK9.     Current Meds  Medication Sig  . acetaminophen (TYLENOL) 500 MG tablet Take 1,000 mg by mouth every 6 (six) hours as needed for mild pain or headache.  [name] aspirin EC 81 MG EC tablet Take 1 tablet (81 mg total) by mouth daily.  . clopidogrel (PLAVIX) 75 MG tablet Take 75 mg by mouth daily.  [name] diltiazem (DILACOR XR) 180 MG 24 hr capsule Take 180 mg by mouth daily.  . ergocalciferol (VITAMIN D2) 1.25 MG (50000 UT) capsule Take 50,000 Units by mouth once a week.  [name] glimepiride (AMARYL)  2 MG tablet Take 2 mg by mouth daily with breakfast.  . loratadine (CLARITIN) 10 MG tablet Take 10 mg by mouth daily.  . metFORMIN (GLUCOPHAGE) 1000 MG tablet Take 1,000 mg by mouth 2 (two) times daily with a meal.  . nitroGLYCERIN (NITROSTAT) 0.4 MG SL tablet Place 1 tablet (0.4 mg total) under the tongue every 5 (five) minutes as needed for chest pain.  . pravastatin (PRAVACHOL) 20 MG tablet Take 20 mg by mouth daily.  . [DISCONTINUED] ezetimibe (ZETIA) 10 MG tablet Take 10 mg by mouth daily.     Allergies  Allergen Reactions  . Statins     itching  . Zetia [Ezetimibe] Itching    Social History   Socioeconomic History  . Marital status: Single    Spouse name: Not on file  . Number of children: Not on file  . Years of education: Not on file  . Highest education level: Not on file  Occupational History  . Not on file  Tobacco Use  . Smoking status: Current Every Day Smoker    Packs/day: 0.50    Years: 33.00    Pack years: 16.50    Types: Cigarettes  . Smokeless tobacco: Never Used  Substance and Sexual Activity  . Alcohol use: No  . Drug use: No  . Sexual activity: Yes    Birth control/protection: Post-menopausal  Other Topics Concern  . Not on file  Social History Narrative  . Not on file   Social Determinants of  Health   Financial Resource Strain:   . Difficulty of Paying Living Expenses: Not on file  Food Insecurity:   . Worried About Charity fundraiser in the Last Year: Not on file  . Ran Out of Food in the Last Year: Not on file  Transportation Needs:   . Lack of Transportation (Medical): Not on file  . Lack of Transportation (Non-Medical): Not on file  Physical Activity:   . Days of Exercise per Week: Not on file  . Minutes of Exercise per Session: Not on file  Stress:   . Feeling of Stress : Not on file  Social Connections:   . Frequency of Communication with Friends and Family: Not on file  . Frequency of Social Gatherings with Friends and Family:  Not on file  . Attends Religious Services: Not on file  . Active Member of Clubs or Organizations: Not on file  . Attends Archivist Meetings: Not on file  . Marital Status: Not on file  Intimate Partner Violence:   . Fear of Current or Ex-Partner: Not on file  . Emotionally Abused: Not on file  . Physically Abused: Not on file  . Sexually Abused: Not on file     Review of Systems: General: negative for chills, fever, night sweats or weight changes.  Cardiovascular: negative for chest pain, dyspnea on exertion, edema, orthopnea, palpitations, paroxysmal nocturnal dyspnea or shortness of breath Dermatological: negative for rash Respiratory: negative for cough or wheezing Urologic: negative for hematuria Abdominal: negative for nausea, vomiting, diarrhea, bright red blood per rectum, melena, or hematemesis Neurologic: negative for visual changes, syncope, or dizziness All other systems reviewed and are otherwise negative except as noted above.    Blood pressure (!) 136/92, pulse 87, temperature (!) 96.6 F (35.9 C), height 5\' 3"  (1.6 m), weight 187 lb (84.8 kg), SpO2 96 %.  General appearance: alert and no distress Neck: no adenopathy, no carotid bruit, no JVD, supple, symmetrical, trachea midline and thyroid not enlarged, symmetric, no tenderness/mass/nodules Lungs: clear to auscultation bilaterally Heart: regular rate and rhythm, S1, S2 normal, no murmur, click, rub or gallop Extremities: extremities normal, atraumatic, no cyanosis or edema Pulses: 2+ and symmetric Skin: Skin color, texture, turgor normal. No rashes or lesions Neurologic: Alert and oriented X 3, normal strength and tone. Normal symmetric reflexes. Normal coordination and gait  EKG not performed today.  ASSESSMENT AND PLAN:   HTN (hypertension) History of essential potential blood pressure measured today 136/92.  She is on diltiazem but has not taken her medications yet today.  TOBACCO  ABUSE History of ongoing tobacco abuse of 1 pack/week recalcitrant risk factor modification.  Coronary artery disease History of CAD status post non-STEMI 12/13/2018 with cath performed by Dr. Ellyn Hack revealing high-grade proximal LAD disease which was stented with a synergy drug-eluting stent.  She did have high-grade diffuse diagonal branch, ramus branch and RCA disease all of which were treated medically.  She was discharged home on aspirin Brilinta and currently is on Plavix.  She denies chest pain or shortness of breath.  Hyperlipidemia History of hyperlipidemia on pravastatin in the past with excellent lipid profile performed 02/25/2019 revealing total cholesterol 103, LDL 49 and HDL of 37.  She did have sepsis and rhabdomyolysis in September with elevated CPKs.  Her statin was discontinued and the discussion was to start a PCSK9.      Lorretta Harp MD FACP,FACC,FAHA, [facility] 09/05/2019 12:12 PM

## 2019-09-05 NOTE — Assessment & Plan Note (Signed)
History of ongoing tobacco abuse of 1 pack/week recalcitrant risk factor modification.

## 2019-09-05 NOTE — Assessment & Plan Note (Signed)
History of essential potential blood pressure measured today 136/92.  She is on diltiazem but has not taken her medications yet today.

## 2019-09-05 NOTE — Assessment & Plan Note (Signed)
History of CAD status post non-STEMI 12/13/2018 with cath performed by Dr. Ellyn Hack revealing high-grade proximal LAD disease which was stented with a synergy drug-eluting stent.  She did have high-grade diffuse diagonal branch, ramus branch and RCA disease all of which were treated medically.  She was discharged home on aspirin Brilinta and currently is on Plavix.  She denies chest pain or shortness of breath.

## 2019-09-05 NOTE — Assessment & Plan Note (Signed)
History of hyperlipidemia on pravastatin in the past with excellent lipid profile performed 02/25/2019 revealing total cholesterol 103, LDL 49 and HDL of 37.  She did have sepsis and rhabdomyolysis in September with elevated CPKs.  Her statin was discontinued and the discussion was to start a PCSK9.

## 2019-09-09 ENCOUNTER — Ambulatory Visit: Payer: Medicaid Other | Admitting: Physical Therapy

## 2019-09-11 ENCOUNTER — Ambulatory Visit: Payer: Medicaid Other

## 2019-09-15 ENCOUNTER — Other Ambulatory Visit: Payer: Self-pay

## 2019-09-15 ENCOUNTER — Ambulatory Visit: Payer: Medicaid Other | Attending: Family Medicine

## 2019-09-15 DIAGNOSIS — R262 Difficulty in walking, not elsewhere classified: Secondary | ICD-10-CM | POA: Diagnosis present

## 2019-09-15 DIAGNOSIS — M6281 Muscle weakness (generalized): Secondary | ICD-10-CM | POA: Insufficient documentation

## 2019-09-15 DIAGNOSIS — R2681 Unsteadiness on feet: Secondary | ICD-10-CM | POA: Diagnosis present

## 2019-09-15 NOTE — Therapy (Signed)
[facility] [address] [place], [place], [address] Phone: [phone]   Fax:  [phone]  Physical Therapy Treatment  Patient Details  Name: Krista [name] MRN: [id number] Date of Birth: 13-May-1961 Referring Provider (PT): Willey Blade   Encounter Date: 09/15/2019  PT End of Session - 09/15/19 1326    Visit Number  17    Number of Visits  25    Date for PT Re-Evaluation  24/58/09   90 day cert but 60 day plan due to scheduling   Authorization Time Period  8 visits 06/29/1212/20; additional approved 4 visits 07/28/2019-08/10/2019, 12 visits 1/13-2/23/2021    Authorization - Visit Number  5    Authorization - Number of Visits  12    PT Start Time  9833    PT Stop Time  1400    PT Time Calculation (min)  38 min    Activity Tolerance  Patient tolerated treatment well;Patient limited by fatigue    Behavior During Therapy  [facility] for tasks assessed/performed       Past Medical History:  Diagnosis Date  . Arthritis   . CAD (coronary artery disease)   . Depression   . Diabetes mellitus   . Hypertension   . NSTEMI (non-ST elevated myocardial infarction) [facility])     Past Surgical History:  Procedure Laterality Date  . ANKLE SURGERY     left ankle - was broken  . CORONARY STENT INTERVENTION N/A 12/16/2018   Procedure: CORONARY STENT INTERVENTION;  Surgeon: Leonie Man, MD;  Location: [facility];  Service: Cardiovascular;  Laterality: N/A;  LAD  . LEFT HEART CATH AND CORONARY ANGIOGRAPHY N/A 12/16/2018   Procedure: LEFT HEART CATH AND CORONARY ANGIOGRAPHY;  Surgeon: Leonie Man, MD;  Location: [facility];  Service: Cardiovascular;  Laterality: N/A;    There were no vitals filed for this visit.  Subjective Assessment - 09/15/19 1325    Subjective  Pt reports that she was just so tired last week not feeling great. Is sore all over today.    Pertinent History  59 y/o female hospitalized 9/29 to  10/2 due to rhabdomyolysis. Presented with chest pain after fall.PMH: MI, CAD s/p stent placement, AKI, DM, HTN, hyperlipidemia    Patient Stated Goals  Pt would like to get better to be able to move easier with less pain and more energy.    Currently in Pain?  Yes    Pain Score  8     Pain Location  --   generalized   Pain Descriptors / Indicators  Sore    Pain Type  Chronic pain    Pain Onset  More than a month ago    Pain Frequency  Constant                       [facility] Adult PT Treatment/Exercise - 09/15/19 1326      Transfers   Transfers  Sit to Stand;Stand to Sit    Sit to Stand  6: Modified independent (Device/Increase time)    Stand to Sit  6: Modified independent (Device/Increase time)      Ambulation/Gait   Ambulation/Gait  Yes    Ambulation/Gait Assistance  6: Modified independent (Device/Increase time)    Ambulation Distance (Feet)  140 Feet   180' x 1   Assistive device  Rolling walker    Gait Pattern  Step-through pattern;Decreased step length - right;Decreased step length -  left    Ambulation Surface  Level;Indoor    Gait Comments  Pt needed seated rest break between gait bouts due ot fatigue.      Neuro Re-ed    Neuro Re-ed Details   Standing on airex without UE support x 1 min CGA. Standing on airex with walker support marching in place x 10 each leg      Exercises   Exercises  Other Exercises    Other Exercises   Sit to stand 5 x 2 from mat with seated rest break in between. Alternating toe taps on 4" step with bilateral UE support x 10 then after seated rest break repeated with 1 UE support.  Sidelying clamshell 10 x 2 each leg with verbal cues for form. Hooklying bridges 10 x 2 with verbal cues to tighten tummy as she goes but not hold breath. Pt reported feeling very tired after. Seated LAQ 10 x 2.              PT Education - 09/15/19 1520    Education Details  Pt to continue with current HEP    Person(s) Educated  Patient    Methods   Explanation    Comprehension  Verbalized understanding       PT Short Term Goals - 08/29/19 1200      PT SHORT TERM GOAL #1   Title  Pt will be independent with progressive HEP for strength, balance and functional mobility to continue gains on own.    Baseline  Pt has been performing her exercises everyday and they are starting to get easier.    Time  4    Period  Weeks    Status  Achieved    Target Date  08/15/19      PT SHORT TERM GOAL #2   Title  Pt will increase gait speed from 0.52 to >0.40ms for improved gait safety.    Baseline  0.510m on 07/16/2019, 08/04/2019 gait speed=0.6272m 08/29/19 gait speed=0.10m28m  Time  4    Period  Weeks    Status  Achieved    Target Date  08/15/19      PT SHORT TERM GOAL #3   Title  Pt will decrease TUG form 21 sec to <18 sec for improved balance and decreased fall risk.    Baseline  08/04/2019 TUG=18.5 sec average of 2, 08/29/19 TUG average=19 sec with RW    Time  4    Period  Weeks    Status  Partially Met    Target Date  08/15/19      PT SHORT TERM GOAL #4   Title  Pt will ambulate >250' with FWW mod I for improved household and short community distances.    Baseline  180' mod I with FWW.; 115 ft x 3 reps with seated rest  breaks in between with RW 08/07/2019    Time  4    Period  Weeks    Status  Not Met    Target Date  08/15/19        PT Long Term Goals - 08/29/19 1121      PT LONG TERM GOAL #1   Title  Pt will increase 30 sec sit to stand to 7 or more for further improvement in functional strength and mobility.    Baseline  07/16/2019 pt performed 5 reps    Time  8    Period  Weeks    Status  Revised  PT LONG TERM GOAL #2   Title  Pt will increase gait speed from 0.36ms to >0.72 m/s for improved gait safety in house and community.    Baseline  07/16/2019 gait speed=0.59m. 08/04/2019 gait speed=0.6277m   Time  8    Period  Weeks    Status  Revised      PT LONG TERM GOAL #3   Title  Pt will decrease TUG to <15  sec for improved balance and decreased fall risk.    Baseline  07/16/2019 TUG=21 sec, 08/04/2019 TUG=18.5 sec    Time  8    Period  Weeks    Status  On-going      PT LONG TERM GOAL #4   Title  Pt will ambulate >400' on varied surfaces with SPC  mod I for improved gait safety.    Baseline  Varied distances 180-200' with RW mod I. Limited due to pain in knees and fatigue.    Time  8    Period  Weeks    Status  On-going      PT LONG TERM GOAL #5   Title  Pt will report <6/10 pain in knees with functional mobility in home for improved function.    Baseline  Not consistently meeting pain goal as varies day to day. Last week ranged from 8-6/10 in knees.    Time  8    Period  Weeks    Status  On-going            Plan - 09/15/19 1520    Clinical Impression Statement  Pt needed frequent rest breaks between activities today reporting she was still tired. Continues to report pain throughout.    Personal Factors and Comorbidities  Comorbidity 3+    Examination-Activity Limitations  Locomotion Level;Transfers;Squat;Stairs;Stand    Examination-Participation Restrictions  Community Activity;Driving;Shop;Meal Prep    Stability/Clinical Decision Making  Evolving/Moderate complexity    Rehab Potential  Good    PT Frequency  2x / week    PT Duration  8 weeks    PT Treatment/Interventions  ADLs/Self Care Home Management;Aquatic Therapy;DME Instruction;Gait training;Stair training;Functional mobility training;Therapeutic activities;Therapeutic exercise;Balance training;Patient/family education;Neuromuscular re-education;Energy conservation;Vestibular;Passive range of motion;Manual techniques    PT Next Visit Plan  update HEP as needed; continue to work on improved balance, strength and gait training wtih improved posture.    PT Home Exercise Plan  Access Code: sit to stands plus [email or web address]    Consulted and Agree with Plan of Care  Patient       Patient will benefit from skilled therapeutic  intervention in order to improve the following deficits and impairments:  Abnormal gait, Cardiopulmonary status limiting activity, Decreased activity tolerance, Decreased balance, Decreased strength, Decreased mobility, Decreased knowledge of use of DME, Impaired flexibility, Decreased endurance, Difficulty walking, Postural dysfunction, Decreased range of motion  Visit Diagnosis: Muscle weakness (generalized)  Difficulty in walking, not elsewhere classified     Problem List Patient Active Problem List   Diagnosis Date Noted  . Dyspnea   . Knee pain   . Rhabdomyolysis due to statin therapy   . Demand ischemia ([facility] . Sepsis ([facility]9/29/2020  . NSTEMI (non-ST elevated myocardial infarction) ([facility]/2020  . Coronary artery disease 02/12/2019  . Hyperlipidemia 02/12/2019  . Unstable angina ([facility] . Near syncope 12/14/2018  . Chest tightness 12/14/2018  . Prolonged Q-T interval on ECG   . Acute gastroenteritis 08/02/2012  . Dehydration 08/02/2012  . AKI (acute kidney injury) (  [place]) 08/02/2012  . Hypokalemia 08/02/2012  . Diabetes mellitus ([place]) 08/02/2012  . HTN (hypertension) 08/02/2012  . Tobacco abuse 08/02/2012  . ALLERGIC RHINITIS WITH CONJUNCTIVITIS 10/21/2010  . ROTATOR CUFF SYNDROME, RIGHT 10/21/2010  . SEBACEOUS CYST, INFECTED 01/31/2010  . GUAIAC POSITIVE STOOL 10/05/2009  . TOBACCO ABUSE 09/21/2009  . RECTAL BLEEDING, HX OF 09/21/2009  . Nonspecific (abnormal) findings on radiological and other examination of body structure 05/19/2009  . RIGHT LOWER LUNG FIELD CRACKLES 05/19/2009  . Diabetes ([place]) 02/12/2009  . Pain in limb 02/12/2009  . DENTAL CARIES 04/21/2008  . HYPERTENSION 05/01/2007    Electa Sniff, PT, DPT, NCS 09/15/2019, 3:22 PM  [facility] [address] [place], [place], [address] Phone: [phone]   Fax:  [phone]  Name: Krista [name] MRN: [id number] Date of Birth:  1961-06-25

## 2019-09-17 ENCOUNTER — Other Ambulatory Visit: Payer: Self-pay

## 2019-09-17 ENCOUNTER — Ambulatory Visit: Payer: Medicaid Other

## 2019-09-17 DIAGNOSIS — M6281 Muscle weakness (generalized): Secondary | ICD-10-CM

## 2019-09-17 DIAGNOSIS — R262 Difficulty in walking, not elsewhere classified: Secondary | ICD-10-CM

## 2019-09-17 NOTE — Therapy (Signed)
[facility] [address] [place], [place], [address] Phone: [phone]   Fax:  [phone]  Physical Therapy Treatment  Patient Details  Name: Krista [name] MRN: [id number] Date of Birth: 03-Jan-1961 Referring Provider (PT): Willey Blade   Encounter Date: 09/17/2019  PT End of Session - 09/17/19 1308    Visit Number  18    Number of Visits  25    Date for PT Re-Evaluation  00/93/81   90 day cert but 60 day plan due to scheduling   Authorization Time Period  8 visits 06/29/1212/20; additional approved 4 visits 07/28/2019-08/10/2019, 12 visits 1/13-2/23/2021    Authorization - Visit Number  5    Authorization - Number of Visits  12    PT Start Time  8299    PT Stop Time  1312    PT Time Calculation (min)  39 min    Activity Tolerance  Patient tolerated treatment well;Patient limited by fatigue    Behavior During Therapy  [facility] for tasks assessed/performed       Past Medical History:  Diagnosis Date  . Arthritis   . CAD (coronary artery disease)   . Depression   . Diabetes mellitus   . Hypertension   . NSTEMI (non-ST elevated myocardial infarction) [facility])     Past Surgical History:  Procedure Laterality Date  . ANKLE SURGERY     left ankle - was broken  . CORONARY STENT INTERVENTION N/A 12/16/2018   Procedure: CORONARY STENT INTERVENTION;  Surgeon: Leonie Man, MD;  Location: [facility];  Service: Cardiovascular;  Laterality: N/A;  LAD  . LEFT HEART CATH AND CORONARY ANGIOGRAPHY N/A 12/16/2018   Procedure: LEFT HEART CATH AND CORONARY ANGIOGRAPHY;  Surgeon: Leonie Man, MD;  Location: [facility];  Service: Cardiovascular;  Laterality: N/A;    There were no vitals filed for this visit.  Subjective Assessment - 09/17/19 1236    Subjective  Pt reports that she is just sore today. Car would not start this morning so aunt had to bring her.    Pertinent History  59 y/o female hospitalized  9/29 to 10/2 due to rhabdomyolysis. Presented with chest pain after fall.PMH: MI, CAD s/p stent placement, AKI, DM, HTN, hyperlipidemia    Patient Stated Goals  Pt would like to get better to be able to move easier with less pain and more energy.    Currently in Pain?  Yes    Pain Score  8     Pain Location  Back   and knees   Pain Orientation  Lower;Right;Left    Pain Descriptors / Indicators  Sore    Pain Type  Chronic pain    Pain Onset  More than a month ago                       [facility] Adult PT Treatment/Exercise - 09/17/19 1245      Transfers   Transfers  Sit to Stand;Stand to Sit    Sit to Stand  6: Modified independent (Device/Increase time)    Stand to Sit  6: Modified independent (Device/Increase time)      Ambulation/Gait   Ambulation/Gait  Yes    Ambulation/Gait Assistance  6: Modified independent (Device/Increase time)    Ambulation Distance (Feet)  230 Feet   230' x 1 again after seated rest break   Assistive device  Rolling walker    Gait Pattern  Step-through  pattern    Ambulation Surface  Level;Indoor    Gait Comments  Pt took seated rest break between bouts reporting she was really tired. HR=96 after.  Walked for 2 min 30 sec      Standardized Balance Assessment   Standardized Balance Assessment  Timed Up and Go Test      Timed Up and Go Test   TUG  Normal TUG    Normal TUG (seconds)  19      Neuro Re-ed    Neuro Re-ed Details   In // bars: marching gait 6' x 4, side stepping 6' x 4, forwards/backwards 6' x 4 then seated rest break. Tandem gait 6' x 4.      Exercises   Exercises  Other Exercises    Other Exercises   Step-ups on 4" step with left leg x 10 with bilateral UE support, alternating toe taps on 4" step x 10 each leg with 1 UE support. Pt reports she can't do step-ups on right leg as knee always was too painful             PT Education - 09/17/19 1603    Education Details  Pt to continue with current HEP    Person(s)  Educated  Patient    Methods  Explanation    Comprehension  Verbalized understanding       PT Short Term Goals - 08/29/19 1200      PT SHORT TERM GOAL #1   Title  Pt will be independent with progressive HEP for strength, balance and functional mobility to continue gains on own.    Baseline  Pt has been performing her exercises everyday and they are starting to get easier.    Time  4    Period  Weeks    Status  Achieved    Target Date  08/15/19      PT SHORT TERM GOAL #2   Title  Pt will increase gait speed from 0.52 to >0.35ms for improved gait safety.    Baseline  0.527m on 07/16/2019, 08/04/2019 gait speed=0.62105m 08/29/19 gait speed=0.4m81m  Time  4    Period  Weeks    Status  Achieved    Target Date  08/15/19      PT SHORT TERM GOAL #3   Title  Pt will decrease TUG form 21 sec to <18 sec for improved balance and decreased fall risk.    Baseline  08/04/2019 TUG=18.5 sec average of 2, 08/29/19 TUG average=19 sec with RW    Time  4    Period  Weeks    Status  Partially Met    Target Date  08/15/19      PT SHORT TERM GOAL #4   Title  Pt will ambulate >250' with FWW mod I for improved household and short community distances.    Baseline  180' mod I with FWW.; 115 ft x 3 reps with seated rest  breaks in between with RW 08/07/2019    Time  4    Period  Weeks    Status  Not Met    Target Date  08/15/19        PT Long Term Goals - 09/17/19 1240      PT LONG TERM GOAL #1   Title  Pt will increase 30 sec sit to stand to 7 or more for further improvement in functional strength and mobility.    Baseline  07/16/2019 pt performed 5 reps, 09/17/19 5 reps  Time  8    Period  Weeks    Status  On-going    Target Date  09/14/19      PT LONG TERM GOAL #2   Title  Pt will increase gait speed from 0.31ms to >0.72 m/s for improved gait safety in house and community.    Baseline  07/16/2019 gait speed=0.582m. 08/04/2019 gait speed=0.6292m   Time  8    Period  Weeks    Status   Revised      PT LONG TERM GOAL #3   Title  Pt will decrease TUG to <15 sec for improved balance and decreased fall risk.    Baseline  07/16/2019 TUG=21 sec, 08/04/2019 TUG=18.5 sec, 09/17/19 TUG=19 sec    Time  8    Period  Weeks    Status  On-going      PT LONG TERM GOAL #4   Title  Pt will ambulate >400' on varied surfaces with SPC  mod I for improved gait safety.    Baseline  Varied distances 180-200' with RW mod I. Limited due to pain in knees and fatigue.    Time  8    Period  Weeks    Status  On-going      PT LONG TERM GOAL #5   Title  Pt will report <6/10 pain in knees with functional mobility in home for improved function.    Baseline  Not consistently meeting pain goal as varies day to day. Last week ranged from 8-6/10 in knees.    Time  8    Period  Weeks    Status  On-going            Plan - 09/17/19 1604    Clinical Impression Statement  Pt able to increase gait distance with encouragement today. PT tried to keep patient moving with activities and she was able to perform more before needing to rest.    Personal Factors and Comorbidities  Comorbidity 3+    Examination-Activity Limitations  Locomotion Level;Transfers;Squat;Stairs;Stand    Examination-Participation Restrictions  Community Activity;Driving;Shop;Meal Prep    Stability/Clinical Decision Making  Evolving/Moderate complexity    Rehab Potential  Good    PT Frequency  2x / week    PT Duration  8 weeks    PT Treatment/Interventions  ADLs/Self Care Home Management;Aquatic Therapy;DME Instruction;Gait training;Stair training;Functional mobility training;Therapeutic activities;Therapeutic exercise;Balance training;Patient/family education;Neuromuscular re-education;Energy conservation;Vestibular;Passive range of motion;Manual techniques    PT Next Visit Plan  continue to work on improved balance, strength and gait training wtih improved posture. Try to keep patient moving throughout session although she request  frequent rest breaks. Vitals maintain well.    PT Home Exercise Plan  Access Code: sit to stands plus [email or web address]    Consulted and Agree with Plan of Care  Patient       Patient will benefit from skilled therapeutic intervention in order to improve the following deficits and impairments:  Abnormal gait, Cardiopulmonary status limiting activity, Decreased activity tolerance, Decreased balance, Decreased strength, Decreased mobility, Decreased knowledge of use of DME, Impaired flexibility, Decreased endurance, Difficulty walking, Postural dysfunction, Decreased range of motion  Visit Diagnosis: Muscle weakness (generalized)  Difficulty in walking, not elsewhere classified     Problem List Patient Active Problem List   Diagnosis Date Noted  . Dyspnea   . Knee pain   . Rhabdomyolysis due to statin therapy   . Demand [place] ([facility] [place] . Sepsis ([facility]9/[place]/2020  . NSTEMI (non-ST elevated myocardial  infarction) ([place]) 05/13/2019  . Coronary artery disease 02/12/2019  . Hyperlipidemia 02/12/2019  . Unstable angina ([place])   . Near syncope 12/14/2018  . Chest tightness 12/14/2018  . Prolonged Q-T interval on ECG   . Acute gastroenteritis 08/02/2012  . Dehydration 08/02/2012  . AKI (acute kidney injury) ([place]) 08/02/2012  . Hypokalemia 08/02/2012  . Diabetes mellitus ([place]) 08/02/2012  . HTN (hypertension) 08/02/2012  . Tobacco abuse 08/02/2012  . ALLERGIC RHINITIS WITH CONJUNCTIVITIS 10/21/2010  . ROTATOR CUFF SYNDROME, RIGHT 10/21/2010  . SEBACEOUS CYST, INFECTED 01/31/2010  . GUAIAC POSITIVE STOOL 10/05/2009  . TOBACCO ABUSE 09/21/2009  . RECTAL BLEEDING, HX OF 09/21/2009  . Nonspecific (abnormal) findings on radiological and other examination of body structure 05/19/2009  . RIGHT LOWER LUNG FIELD CRACKLES 05/19/2009  . Diabetes ([place]) 02/12/2009  . Pain in limb 02/12/2009  . DENTAL CARIES 04/21/2008  . HYPERTENSION 05/01/2007    Electa Sniff, PT, DPT, NCS 09/17/2019, 4:12  PM  [facility] [address] [place], [place], [address] Phone: [phone]   Fax:  [phone]  Name: Krista [name] MRN: [id number] Date of Birth: 05-16-1961

## 2019-09-22 ENCOUNTER — Other Ambulatory Visit: Payer: Self-pay

## 2019-09-22 ENCOUNTER — Ambulatory Visit: Payer: Medicaid Other | Admitting: Physical Therapy

## 2019-09-22 ENCOUNTER — Telehealth: Payer: Self-pay

## 2019-09-22 ENCOUNTER — Encounter: Payer: Self-pay | Admitting: Physical Therapy

## 2019-09-22 DIAGNOSIS — R2681 Unsteadiness on feet: Secondary | ICD-10-CM

## 2019-09-22 DIAGNOSIS — M6281 Muscle weakness (generalized): Secondary | ICD-10-CM | POA: Diagnosis not present

## 2019-09-22 DIAGNOSIS — R262 Difficulty in walking, not elsewhere classified: Secondary | ICD-10-CM

## 2019-09-22 NOTE — Telephone Encounter (Signed)
PT CALLED IN AND STATED THAT THEY WILL TRY TO COMPLETE THE LABS TODAY

## 2019-09-22 NOTE — Telephone Encounter (Signed)
lmomed the pt to complete lipid labs and to bring insurance card through [facility] so that a [email or web address] can be started

## 2019-09-22 NOTE — Therapy (Signed)
[facility] [address] [place], [place], [address] Phone: [phone]   Fax:  [phone]  Physical Therapy Treatment  Patient Details  Name: Krista [name] MRN: [id number] Date of Birth: 09/27/1960 Referring Provider (PT): Willey Blade   Encounter Date: 09/22/2019  PT End of Session - 09/22/19 1436    Visit Number  19    Number of Visits  25    Date for PT Re-Evaluation  16/38/46   90 day cert but 60 day plan due to scheduling   Authorization Type  Medicaid    Authorization Time Period  8 visits 06/29/1212/20; additional approved 4 visits 07/28/2019-08/10/2019, 12 visits 1/13-2/23/2021    Authorization - Visit Number  6    Authorization - Number of Visits  12    PT Start Time  6599    PT Stop Time  1312    PT Time Calculation (min)  41 min    Equipment Utilized During Treatment  Gait belt    Activity Tolerance  Patient tolerated treatment well;Patient limited by fatigue    Behavior During Therapy  [facility] for tasks assessed/performed       Past Medical History:  Diagnosis Date  . Arthritis   . CAD (coronary artery disease)   . Depression   . Diabetes mellitus   . Hypertension   . NSTEMI (non-ST elevated myocardial infarction) [facility])     Past Surgical History:  Procedure Laterality Date  . ANKLE SURGERY     left ankle - was broken  . CORONARY STENT INTERVENTION N/A 12/16/2018   Procedure: CORONARY STENT INTERVENTION;  Surgeon: Leonie Man, MD;  Location: [facility];  Service: Cardiovascular;  Laterality: N/A;  LAD  . LEFT HEART CATH AND CORONARY ANGIOGRAPHY N/A 12/16/2018   Procedure: LEFT HEART CATH AND CORONARY ANGIOGRAPHY;  Surgeon: Leonie Man, MD;  Location: [facility];  Service: Cardiovascular;  Laterality: N/A;    There were no vitals filed for this visit.  Subjective Assessment - 09/22/19 1233    Subjective  Pt reports being sore today; being sore everyday.  No changes  since last visit.    Pertinent History  59 y/o female hospitalized 9/29 to 10/2 due to rhabdomyolysis. Presented with chest pain after fall.PMH: MI, CAD s/p stent placement, AKI, DM, HTN, hyperlipidemia    Patient Stated Goals  Pt would like to get better to be able to move easier with less pain and more energy.    Currently in Pain?  Yes    Pain Score  8     Pain Location  Knee   elbows   Pain Orientation  Right;Left    Pain Descriptors / Indicators  Aching    Pain Type  Chronic pain    Pain Onset  More than a month ago    Pain Frequency  Constant    Aggravating Factors   not sure, hurts all the time    Pain Relieving Factors  Tylenol helps some                       [facility] Adult PT Treatment/Exercise - 09/22/19 1237      Transfers   Transfers  Sit to Stand;Stand to Sit    Sit to Stand  6: Modified independent (Device/Increase time)    Stand to Sit  6: Modified independent (Device/Increase time)    Comments  30 sec sit to stand=6 reps; used UE support from  chair      Ambulation/Gait   Ambulation/Gait  Yes    Ambulation/Gait Assistance  6: Modified independent (Device/Increase time)    Ambulation Distance (Feet)  345 Feet   385 ft   Assistive device  Rolling walker    Gait Pattern  Step-through pattern    Ambulation Surface  Level;Indoor    Gait velocity  15.38 sec = 2.38 ft/sec (0.73 m/sec)    Pre-Gait Activities  Cues with gait, for improved posture, decreased forward lean through RW.    Gait Comments  2 minute walk test:  230 ft with RW.  With gait, O2 sats 99%, HR 82 bpm.      Neuro Re-ed    Neuro Re-ed Details   In parallel bars: marching gait 8' x 4, side stepping 8' x 3 reps R and L, forwards/backwards 8' x 4 then seated rest break. Tandem gait 8' x 4, then marching tandem gait 8" x 4 reps.    with fwd/back walking-reduced BUE support>1 UE support         Balance Exercises - 09/22/19 1252      Balance Exercises: Standing   Standing Eyes Opened   Wide (BOA);Solid surface;Head turns;5 reps   Head nods, 2 sets   Standing Eyes Closed  Wide (BOA);Solid surface;1 rep;10 secs    Marching  Solid surface;Upper extremity assist 2;10 reps   Marching in place, 2 sets   Other Standing Exercises  Standing with wide BOS-alternating UE lifts, 2 sets x 10 reps, for decreased UE support in parallel bars          PT Short Term Goals - 08/29/19 1200      PT SHORT TERM GOAL #1   Title  Pt will be independent with progressive HEP for strength, balance and functional mobility to continue gains on own.    Baseline  Pt has been performing her exercises everyday and they are starting to get easier.    Time  4    Period  Weeks    Status  Achieved    Target Date  08/15/19      PT SHORT TERM GOAL #2   Title  Pt will increase gait speed from 0.52 to >0.11ms for improved gait safety.    Baseline  0.58m on 07/16/2019, 08/04/2019 gait speed=0.6239m 08/29/19 gait speed=0.9m71m  Time  4    Period  Weeks    Status  Achieved    Target Date  08/15/19      PT SHORT TERM GOAL #3   Title  Pt will decrease TUG form 21 sec to <18 sec for improved balance and decreased fall risk.    Baseline  08/04/2019 TUG=18.5 sec average of 2, 08/29/19 TUG average=19 sec with RW    Time  4    Period  Weeks    Status  Partially Met    Target Date  08/15/19      PT SHORT TERM GOAL #4   Title  Pt will ambulate >250' with FWW mod I for improved household and short community distances.    Baseline  180' mod I with FWW.; 115 ft x 3 reps with seated rest  breaks in between with RW 08/07/2019    Time  4    Period  Weeks    Status  Not Met    Target Date  08/15/19        PT Long Term Goals - 09/22/19 1303      PT LONG  TERM GOAL #1   Title  Pt will increase 30 sec sit to stand to 7 or more for further improvement in functional strength and mobility.  (Goal date for all LTGs extended, as weeks remain in POC:  10/03/2019)    Baseline  07/16/2019 pt performed 5 reps, 09/17/19  5 reps, 6 reps 09/22/2019    Time  8    Period  Weeks    Status  On-going      PT LONG TERM GOAL #2   Title  Pt will increase gait speed from 0.48ms to >0.72 m/s for improved gait safety in house and community.    Baseline  07/16/2019 gait speed=0.592m. 08/04/2019 gait speed=0.6227m 0.73 m/sec 09/22/2019    Time  8    Period  Weeks    Status  Achieved      PT LONG TERM GOAL #3   Title  Pt will decrease TUG to <15 sec for improved balance and decreased fall risk.    Baseline  07/16/2019 TUG=21 sec, 08/04/2019 TUG=18.5 sec, 09/17/19 TUG=19 sec    Time  8    Period  Weeks    Status  On-going      PT LONG TERM GOAL #4   Title  Pt will ambulate >400' on varied surfaces with SPC  mod I for improved gait safety.    Baseline  Varied distances 180-200' with RW mod I. Limited due to pain in knees and fatigue.    Time  8    Period  Weeks    Status  On-going      PT LONG TERM GOAL #5   Title  Pt will report <6/10 pain in knees with functional mobility in home for improved function.    Baseline  Not consistently meeting pain goal as varies day to day. Last week ranged from 8-6/10 in knees.    Time  8    Period  Weeks    Status  On-going            Plan - 09/22/19 1438    Clinical Impression Statement  This week is week 7 of 8 in current POC.  Pt has improved 30 second sit<>stand to 6 reps (from 5 at last check).  Pt has improved gait velocity to 0.73 ft/sec (and has met gait velocity goals).  Pt demonstrates improved gait distance (345 ft, 385 ft) with RW prior to needing seated rest break.  Pt is on target towards remaining LTGs and will continue to benefit from skilled PT to work towards remaining [place]  Personal Factors and Comorbidities  Comorbidity 3+    Examination-Activity Limitations  Locomotion Level;Transfers;Squat;Stairs;Stand    Examination-Participation Restrictions  Community Activity;Driving;Shop;Meal Prep    Stability/Clinical Decision Making  Evolving/Moderate complexity     Rehab Potential  Good    PT Frequency  2x / week    PT Duration  8 weeks    PT Treatment/Interventions  ADLs/Self Care Home Management;Aquatic Therapy;DME Instruction;Gait training;Stair training;Functional mobility training;Therapeutic activities;Therapeutic exercise;Balance training;Patient/family education;Neuromuscular re-education;Energy conservation;Vestibular;Passive range of motion;Manual techniques    PT Next Visit Plan  Discussed POC and pt seems in agreement to d/c next week; continue to give pt goals for functional activities-sit<>stand reps, gait distance, activities in parallel bars to keep patient moving and decrease seated rest breaks    PT Home Exercise Plan  Access Code: sit to stands plus [email or web address] Consulted and Agree with Plan of Care  Patient  Patient will benefit from skilled therapeutic intervention in order to improve the following deficits and impairments:  Abnormal gait, Cardiopulmonary status limiting activity, Decreased activity tolerance, Decreased balance, Decreased strength, Decreased mobility, Decreased knowledge of use of DME, Impaired flexibility, Decreased endurance, Difficulty walking, Postural dysfunction, Decreased range of motion  Visit Diagnosis: Muscle weakness (generalized)  Difficulty in walking, not elsewhere classified  Unsteadiness on feet     Problem List Patient Active Problem List   Diagnosis Date Noted  . Dyspnea   . Knee pain   . Rhabdomyolysis due to statin therapy   . Demand ischemia ([place])   . Sepsis ([place]) 05/13/2019  . NSTEMI (non-ST elevated myocardial infarction) ([place]) 05/13/2019  . Coronary artery disease 02/12/2019  . Hyperlipidemia 02/12/2019  . Unstable angina ([place])   . Near syncope 12/14/2018  . Chest tightness 12/14/2018  . Prolonged Q-T interval on ECG   . Acute gastroenteritis 08/02/2012  . Dehydration 08/02/2012  . AKI (acute kidney injury) ([place]) 08/02/2012  . Hypokalemia 08/02/2012  . Diabetes  mellitus ([place]) 08/02/2012  . HTN (hypertension) 08/02/2012  . Tobacco abuse 08/02/2012  . ALLERGIC RHINITIS WITH CONJUNCTIVITIS 10/21/2010  . ROTATOR CUFF SYNDROME, RIGHT 10/21/2010  . SEBACEOUS CYST, INFECTED 01/31/2010  . GUAIAC POSITIVE STOOL 10/05/2009  . TOBACCO ABUSE 09/21/2009  . RECTAL BLEEDING, HX OF 09/21/2009  . Nonspecific (abnormal) findings on radiological and other examination of body structure 05/19/2009  . RIGHT LOWER LUNG FIELD CRACKLES 05/19/2009  . Diabetes ([place]) 02/12/2009  . Pain in limb 02/12/2009  . DENTAL CARIES 04/21/2008  . HYPERTENSION 05/01/2007    Arda Keadle W. 09/22/2019, 2:42 PM  Frazier Butt., PT   [facility] [address] [place], [place], [address] Phone: [phone]   Fax:  [phone]  Name: Krista [name] MRN: [id number] Date of Birth: 1960-09-21

## 2019-09-23 LAB — LIPID PANEL
Chol/HDL Ratio: 4.1 ratio (ref 0.0–4.4)
Cholesterol, Total: 170 mg/dL (ref 100–199)
HDL: 41 mg/dL (ref >39)
LDL Chol Calc (NIH): 113 mg/dL — ABNORMAL HIGH (ref 0–99)
Triglycerides: 83 mg/dL (ref 0–149)
VLDL Cholesterol Cal: 16 mg/dL (ref 5–40)

## 2019-09-23 LAB — HEPATIC FUNCTION PANEL
ALT: 21 IU/L (ref 0–32)
AST: 22 IU/L (ref 0–40)
Albumin: 3.9 g/dL (ref 3.8–4.9)
Alkaline Phosphatase: 62 IU/L (ref 39–117)
Bilirubin Total: 0.4 mg/dL (ref 0.0–1.2)
Bilirubin, Direct: 0.1 mg/dL (ref 0.00–0.40)
Total Protein: 6.9 g/dL (ref 6.0–8.5)

## 2019-09-24 ENCOUNTER — Other Ambulatory Visit: Payer: Self-pay

## 2019-09-24 ENCOUNTER — Ambulatory Visit: Payer: Medicaid Other

## 2019-09-24 DIAGNOSIS — R262 Difficulty in walking, not elsewhere classified: Secondary | ICD-10-CM

## 2019-09-24 DIAGNOSIS — M6281 Muscle weakness (generalized): Secondary | ICD-10-CM | POA: Diagnosis not present

## 2019-09-24 NOTE — Therapy (Signed)
[facility] [address] [place], [place], [address] Phone: [phone]   Fax:  [phone]  Physical Therapy Treatment  Patient Details  Name: Krista [name] MRN: [id number] Date of Birth: [birth year] Referring Provider (PT): Willey Blade   Encounter Date: 09/24/2019  PT End of Session - 09/24/19 1236    Visit Number  20    Number of Visits  25    Date for PT Re-Evaluation  80/88/11   90 day cert but 60 day plan due to scheduling   Authorization Type  Medicaid    Authorization Time Period  8 visits 06/29/1212/20; additional approved 4 visits 07/28/2019-08/10/2019, 12 visits 1/13-2/23/2021    Authorization - Visit Number  7    Authorization - Number of Visits  12    PT Start Time  1232    PT Stop Time  1311    PT Time Calculation (min)  39 min    Equipment Utilized During Treatment  Gait belt    Activity Tolerance  Patient tolerated treatment well;Patient limited by fatigue    Behavior During Therapy  [facility] for tasks assessed/performed       Past Medical History:  Diagnosis Date  . Arthritis   . CAD (coronary artery disease)   . Depression   . Diabetes mellitus   . Hypertension   . NSTEMI (non-ST elevated myocardial infarction) [facility])     Past Surgical History:  Procedure Laterality Date  . ANKLE SURGERY     left ankle - was broken  . CORONARY STENT INTERVENTION N/A 12/16/2018   Procedure: CORONARY STENT INTERVENTION;  Surgeon: Leonie Man, MD;  Location: [facility];  Service: Cardiovascular;  Laterality: N/A;  LAD  . LEFT HEART CATH AND CORONARY ANGIOGRAPHY N/A 12/16/2018   Procedure: LEFT HEART CATH AND CORONARY ANGIOGRAPHY;  Surgeon: Leonie Man, MD;  Location: [facility];  Service: Cardiovascular;  Laterality: N/A;    There were no vitals filed for this visit.  Subjective Assessment - 09/24/19 1235    Subjective  Pt reports she is sore today. Went home after last session and  took a nap.    Pertinent History  59 y/o female hospitalized 9/29 to 10/2 due to rhabdomyolysis. Presented with chest pain after fall.PMH: MI, CAD s/p stent placement, AKI, DM, HTN, hyperlipidemia    Patient Stated Goals  Pt would like to get better to be able to move easier with less pain and more energy.    Currently in Pain?  Yes    Pain Score  8     Pain Location  Knee   shoulders, back   Pain Descriptors / Indicators  Aching    Pain Type  Chronic pain    Pain Onset  More than a month ago    Pain Frequency  Constant                       [facility] Adult PT Treatment/Exercise - 09/24/19 1237      Ambulation/Gait   Ambulation/Gait  Yes    Ambulation/Gait Assistance  6: Modified independent (Device/Increase time)    Ambulation Distance (Feet)  575 Feet    Assistive device  Rolling walker    Gait Pattern  Step-through pattern    Ambulation Surface  Level;Indoor    Gait Comments  Pt able to carry on conversation throughout. She reported feeling very tired at end. Ambulated for 6 min straight. HR=86, BP=138/98. Has  been out of BP meds since last week. Has called her doctor.       Neuro Re-ed    Neuro Re-ed Details   In // bars: marching gait 6' x 6 with 1 UE support, side stepping 6' x 6, backwards gait 6' x 6 with bilateral UE support. Standing on airex: 30 sec eyes open, 30 sec eyes closed, head turns up/down x 10 and left/right x 10. CGA for safety. Pt had increased sway on foam especially with eyes closed. Standing on airex with trunk rotation to each side reaching across handing ball to therapist on side      Exercises   Exercises  Other Exercises    Other Exercises   Step-ups on 4" step with left leg x 10 forward and x 10 lateral. Deferred on right as patient reports it bother right knee too much.             PT Education - 09/24/19 1438    Education Details  Pt instructed to continue current HEP but changing standing march to walking march and hip abd to side  stepping for more movement. Also discussed setting timer for 5 min when walking longer bout in home daily to give target.    Person(s) Educated  Patient    Methods  Explanation;Demonstration    Comprehension  Verbalized understanding;Returned demonstration       PT Short Term Goals - 09/24/19 1438      PT SHORT TERM GOAL #1   Title  Pt will be independent with progressive HEP for strength, balance and functional mobility to continue gains on own.    Baseline  Pt has been performing her exercises everyday and they are starting to get easier.    Time  4    Period  Weeks    Status  Achieved    Target Date  08/15/19      PT SHORT TERM GOAL #2   Title  Pt will increase gait speed from 0.52 to >0.42ms for improved gait safety.    Baseline  0.564m on 07/16/2019, 08/04/2019 gait speed=0.62102m 08/29/19 gait speed=0.64m45m  Time  4    Period  Weeks    Status  Achieved    Target Date  08/15/19      PT SHORT TERM GOAL #3   Title  Pt will decrease TUG form 21 sec to <18 sec for improved balance and decreased fall risk.    Baseline  08/04/2019 TUG=18.5 sec average of 2, 08/29/19 TUG average=19 sec with RW    Time  4    Period  Weeks    Status  Partially Met    Target Date  08/15/19      PT SHORT TERM GOAL #4   Title  Pt will ambulate >250' with FWW mod I for improved household and short community distances.    Baseline  180' mod I with FWW.; 115 ft x 3 reps with seated rest  breaks in between with RW 08/07/2019, 575' mod I with RW on 09/24/19    Time  4    Period  Weeks    Status  Achieved    Target Date  08/15/19        PT Long Term Goals - 09/24/19 1439      PT LONG TERM GOAL #1   Title  Pt will increase 30 sec sit to stand to 7 or more for further improvement in functional strength and mobility.  (Goal date for  all LTGs extended, as weeks remain in POC:  10/03/2019)    Baseline  07/16/2019 pt performed 5 reps, 09/17/19 5 reps, 6 reps 09/22/2019    Time  8    Period  Weeks    Status   On-going      PT LONG TERM GOAL #2   Title  Pt will increase gait speed from 0.52ms to >0.72 m/s for improved gait safety in house and community.    Baseline  07/16/2019 gait speed=0.552m. 08/04/2019 gait speed=0.6253m 0.73 m/sec 09/22/2019    Time  8    Period  Weeks    Status  Achieved      PT LONG TERM GOAL #3   Title  Pt will decrease TUG to <15 sec for improved balance and decreased fall risk.    Baseline  07/16/2019 TUG=21 sec, 08/04/2019 TUG=18.5 sec, 09/17/19 TUG=19 sec    Time  8    Period  Weeks    Status  On-going      PT LONG TERM GOAL #4   Title  Pt will ambulate >400' on varied surfaces with SPC  mod I for improved gait safety.    Baseline  575' on 09/24/19 with RW mod I on level surface    Time  8    Period  Weeks    Status  On-going      PT LONG TERM GOAL #5   Title  Pt will report <6/10 pain in knees with functional mobility in home for improved function.    Baseline  Not consistently meeting pain goal as varies day to day. Last week ranged from 8-6/10 in knees.    Time  8    Period  Weeks    Status  On-going            Plan - 09/24/19 1440    Clinical Impression Statement  Pt had improvement in gait distance today meeting STG as well as distance part of LTG on level surface mod I. Have not assessed on nonlevel surfaces.    Personal Factors and Comorbidities  Comorbidity 3+    Examination-Activity Limitations  Locomotion Level;Transfers;Squat;Stairs;Stand    Examination-Participation Restrictions  Community Activity;Driving;Shop;Meal Prep    Stability/Clinical Decision Making  Evolving/Moderate complexity    Rehab Potential  Good    PT Frequency  2x / week    PT Duration  8 weeks    PT Treatment/Interventions  ADLs/Self Care Home Management;Aquatic Therapy;DME Instruction;Gait training;Stair training;Functional mobility training;Therapeutic activities;Therapeutic exercise;Balance training;Patient/family education;Neuromuscular re-education;Energy  conservation;Vestibular;Passive range of motion;Manual techniques    PT Next Visit Plan  Discussed POC and pt seems in agreement to d/c next week; Assess gait on varied surfaces. continue to give pt goals for functional activities-sit<>stand reps, gait distance, activities in parallel bars to keep patient moving and decrease seated rest breaks    PT Home Exercise Plan  Access Code: sit to stands plus [email or web address]    Consulted and Agree with Plan of Care  Patient       Patient will benefit from skilled therapeutic intervention in order to improve the following deficits and impairments:  Abnormal gait, Cardiopulmonary status limiting activity, Decreased activity tolerance, Decreased balance, Decreased strength, Decreased mobility, Decreased knowledge of use of DME, Impaired flexibility, Decreased endurance, Difficulty walking, Postural dysfunction, Decreased range of motion  Visit Diagnosis: Muscle weakness (generalized)  Difficulty in walking, not elsewhere classified     Problem List Patient Active Problem List   Diagnosis Date Noted  . Dyspnea   .  Knee pain   . Rhabdomyolysis due to statin therapy   . Demand ischemia ([place])   . Sepsis ([place]) 05/13/2019  . NSTEMI (non-ST elevated myocardial infarction) ([place]) 05/13/2019  . Coronary artery disease 02/12/2019  . Hyperlipidemia 02/12/2019  . Unstable angina ([place])   . Near syncope 12/14/2018  . Chest tightness 12/14/2018  . Prolonged Q-T interval on ECG   . Acute gastroenteritis 08/02/2012  . Dehydration 08/02/2012  . AKI (acute kidney injury) ([place]) 08/02/2012  . Hypokalemia 08/02/2012  . Diabetes mellitus ([place]) 08/02/2012  . HTN (hypertension) 08/02/2012  . Tobacco abuse 08/02/2012  . ALLERGIC RHINITIS WITH CONJUNCTIVITIS 10/21/2010  . ROTATOR CUFF SYNDROME, RIGHT 10/21/2010  . SEBACEOUS CYST, INFECTED 01/31/2010  . GUAIAC POSITIVE STOOL 10/05/2009  . TOBACCO ABUSE 09/21/2009  . RECTAL BLEEDING, HX OF 09/21/2009  . Nonspecific  (abnormal) findings on radiological and other examination of body structure 05/19/2009  . RIGHT LOWER LUNG FIELD CRACKLES 05/19/2009  . Diabetes ([place]) 02/12/2009  . Pain in limb 02/12/2009  . DENTAL CARIES 04/21/2008  . HYPERTENSION 05/01/2007    Electa Sniff, PT, DPT, NCS 09/24/2019, 2:43 PM  [facility] [address] [place], [place], [address] Phone: [phone]   Fax:  [phone]  Name: Krista [name] MRN: [id number] Date of Birth: Nov 16, 1960

## 2019-09-26 ENCOUNTER — Telehealth: Payer: Self-pay | Admitting: Cardiovascular Disease

## 2019-09-26 NOTE — Progress Notes (Signed)
Patient would like her lab results

## 2019-09-26 NOTE — Telephone Encounter (Signed)
Left message for patient to call and schedule LIPID consult with Pharm D

## 2019-09-28 ENCOUNTER — Other Ambulatory Visit: Payer: Self-pay

## 2019-09-28 ENCOUNTER — Encounter (HOSPITAL_COMMUNITY): Payer: Self-pay | Admitting: Emergency Medicine

## 2019-09-28 ENCOUNTER — Ambulatory Visit (HOSPITAL_COMMUNITY)
Admission: EM | Admit: 2019-09-28 | Discharge: 2019-09-28 | Disposition: A | Discharge status: Home / Self Care | Payer: Medicaid Other | Attending: Emergency Medicine | Admitting: Emergency Medicine

## 2019-09-28 DIAGNOSIS — I10 Essential (primary) hypertension: Secondary | ICD-10-CM

## 2019-09-28 DIAGNOSIS — E139 Other specified diabetes mellitus without complications: Secondary | ICD-10-CM

## 2019-09-28 DIAGNOSIS — Z760 Encounter for issue of repeat prescription: Secondary | ICD-10-CM

## 2019-09-28 MED ORDER — GLIMEPIRIDE 2 MG PO TABS: 2.0000 mg | ORAL_TABLET | Freq: Every day | ORAL | 1 refills | Status: DC

## 2019-09-28 MED ORDER — DILTIAZEM HCL ER 180 MG PO CP24: 360.0000 mg | ORAL_CAPSULE | Freq: Every day | ORAL | 1 refills | Status: DC

## 2019-09-28 NOTE — ED Triage Notes (Signed)
Needs refill on diltiazem and glimiperide.

## 2019-09-28 NOTE — ED Provider Notes (Signed)
HPI  SUBJECTIVE:  Krista [name] is a 59 y.o. female who presents for medication refill.  States that she ran out of her diltiazem and glimepiride 3 weeks ago.  She does not have any refills on these medications and states that she has been unable to get in touch with her PMD.  She reports a gradual onset posterior constant headache starting today that is consistent with previous headaches when her blood pressure is elevated.  States that her blood pressure has been reading 160/115 at home and is normally 120/80.  States that she measures her blood pressure once a day.  No photophobia, nausea, vomiting, fevers, visual changes, neck stiffness, rash.  No slurred speech, facial droop, arm or leg weakness, discoordination.  No chest pain, shortness of breath, pain tearing through to her back, abdominal pain, seizures, syncope, anuria, hematuria, lower extremity edema.  No aggravating or alleviating factors.  She has not tried anything for this.  She states that her glucose has been running within normal limits for her.  States that she does not need refills on her Metformin or Plavix.  She has a past medical history of diabetes on Metformin, glimepiride, hypertension on diltiazem.  Also history of hypercholesterolemia, coronary artery disease, smoking, NSTEMI status post stent, rhabdomyolysis secondary to statin use.  No history of stroke, aneurysm.  [id number], Threasa Beards, FNP  Past Medical History:  Diagnosis Date  . Arthritis   . CAD (coronary artery disease)   . Depression   . Diabetes mellitus   . Hypertension   . NSTEMI (non-ST elevated myocardial infarction) [facility])     Past Surgical History:  Procedure Laterality Date  . ANKLE SURGERY     left ankle - was broken  . CORONARY STENT INTERVENTION N/A 12/16/2018   Procedure: CORONARY STENT INTERVENTION;  Surgeon: Leonie Man, MD;  Location: [facility];  Service: Cardiovascular;  Laterality: N/A;  LAD  . LEFT HEART CATH AND CORONARY  ANGIOGRAPHY N/A 12/16/2018   Procedure: LEFT HEART CATH AND CORONARY ANGIOGRAPHY;  Surgeon: Leonie Man, MD;  Location: [facility];  Service: Cardiovascular;  Laterality: N/A;    Family History  Problem Relation Age of Onset  . Diabetes Mother   . COPD Mother   . Hypertension Mother   . Diabetes Father   . Hypertension Father     Social History   Tobacco Use  . Smoking status: Current Every Day Smoker    Packs/day: 0.50    Years: 33.00    Pack years: 16.50    Types: Cigarettes  . Smokeless tobacco: Never Used  Substance Use Topics  . Alcohol use: No  . Drug use: No    No current facility-administered medications for this encounter.  Current Outpatient Medications:  .  aspirin EC 81 MG EC tablet, Take 1 tablet (81 mg total) by mouth daily., Disp: 30 tablet, Rfl: 0 .  clopidogrel (PLAVIX) 75 MG tablet, Take 1 tablet (75 mg total) by mouth daily., Disp: 90 tablet, Rfl: 3 .  metFORMIN (GLUCOPHAGE) 1000 MG tablet, Take 1,000 mg by mouth 2 (two) times daily with a meal., Disp: , Rfl:  .  acetaminophen (TYLENOL) 500 MG tablet, Take 1,000 mg by mouth every 6 (six) hours as needed for mild pain or headache., Disp: , Rfl:  .  clopidogrel (PLAVIX) 75 MG tablet, Take 75 mg by mouth daily., Disp: , Rfl:  .  diltiazem (DILACOR XR) 180 MG 24 hr capsule, Take 2 capsules (360  mg total) by mouth daily., Disp: 60 capsule, Rfl: 1 .  ergocalciferol (VITAMIN D2) 1.25 MG (50000 UT) capsule, Take 50,000 Units by mouth once a week., Disp: , Rfl:  .  glimepiride (AMARYL) 2 MG tablet, Take 1 tablet (2 mg total) by mouth daily with breakfast., Disp: 30 tablet, Rfl: 1 .  loratadine (CLARITIN) 10 MG tablet, Take 10 mg by mouth daily., Disp: , Rfl:  .  nitroGLYCERIN (NITROSTAT) 0.4 MG SL tablet, Place 1 tablet (0.4 mg total) under the tongue every 5 (five) minutes as needed for chest pain., Disp: 30 tablet, Rfl: 2 .  pravastatin (PRAVACHOL) 20 MG tablet, Take 20 mg by mouth daily., Disp: , Rfl:    Allergies  Allergen Reactions  . Statins     itching  . Zetia [Ezetimibe] Itching     ROS  As noted in HPI.   Physical Exam  BP (!) 151/103   Pulse 90   Temp 98.8 F (37.1 C) (Oral)   Resp 20   SpO2 99%   Constitutional: Well developed, well nourished, no acute distress Eyes: PERRL, EOMI, conjunctiva normal bilaterally, no photophobia HENT: Normocephalic, atraumatic,mucus membranes moist Respiratory: Clear to auscultation bilaterally, no rales, no wheezing, no rhonchi Cardiovascular: Normal rate and rhythm, no murmurs, no gallops, no rubs GI: Nondistended skin: No rash, skin intact Musculoskeletal: No lower extremity edema Neurologic: Alert & oriented x 3, CN III-XII  intact, no motor deficits, sensation grossly intact Psychiatric: Speech and behavior appropriate   ED Course   Medications - No data to display  No orders of the defined types were placed in this encounter.  No results found for this or any previous visit (from the past 24 hour(s)). No results found.  ED Clinical Impression  1. Essential hypertension   2. Other specified diabetes mellitus without complication, without long-term current use of insulin ([place])   3. Medication refill      ED Assessment/Plan  1.  Hypertension.  Patient reports headache but has no signs of a hypertensive emergency.  She is neurologically intact   has not taken BP meds in 3 weeks. Pt has no historical evidence of end organ damage.  Will refill her diltiazem XR 180 mg 2 tabs daily With 1 refill.  She states that this controls her blood pressure well.  We will have her keep a log of her blood pressure at home.  Follow-up with PMD as needed.  To the ER for signs of hypertensive emergency  2.  Diabetes.  Patient states that she ran out of her glimepiride and needs refill of this.  Refilling glimepiride 2 mg daily with 1 refill.  She has 2 refills on her Metformin does not need a prescription for this.   Discussed  medical decision-making, treatment plan and plan for follow-up with patient.  Discussed signs and symptoms that should prompt return to the emergency department.  Patient agrees with plan.  Meds ordered this encounter  Medications  . diltiazem (DILACOR XR) 180 MG 24 hr capsule    Sig: Take 2 capsules (360 mg total) by mouth daily.    Dispense:  60 capsule    Refill:  1  . glimepiride (AMARYL) 2 MG tablet    Sig: Take 1 tablet (2 mg total) by mouth daily with breakfast.    Dispense:  30 tablet    Refill:  1    *This clinic note was created using [employer]. Therefore, there may be occasional mistakes despite careful proofreading.  ?  Melynda Ripple, MD 09/29/19 [phone]

## 2019-09-28 NOTE — ED Triage Notes (Signed)
PT needs refills of BP meds, has been out for 2 weeks.

## 2019-09-28 NOTE — Discharge Instructions (Addendum)
Decrease your salt intake. diet and exercise will lower your blood pressure significantly. It is important to keep your blood pressure under good control, as having a elevated blood pressure for prolonged periods of time significantly increases your risk of stroke, heart attacks, kidney damage, eye damage, and other problems. Measure your blood pressure once a day, preferably at the same time every day. Keep a log of this and bring it to your next doctor's appointment.  Bring your blood pressure cuff as well.   Return immediately to the ER if you start having chest pain, headache, problems seeing, problems talking, problems walking, if you feel like you're about to pass out, if you do pass out, if you have a seizure, or for any other concerns.  Go to www.goodrx.com to look up your medications. This will give you a list of where you can find your prescriptions at the most affordable prices. Or ask the pharmacist what the cash price is, or if they have any other discount programs available to help make your medication more affordable. This can be less expensive than what you would pay with insurance.   

## 2019-09-29 ENCOUNTER — Ambulatory Visit: Payer: Medicaid Other

## 2019-09-29 VITALS — BP 132/82

## 2019-09-29 DIAGNOSIS — R262 Difficulty in walking, not elsewhere classified: Secondary | ICD-10-CM

## 2019-09-29 DIAGNOSIS — M6281 Muscle weakness (generalized): Secondary | ICD-10-CM | POA: Diagnosis not present

## 2019-09-29 NOTE — Therapy (Signed)
[facility] [address] [place], [place], [address] Phone: [phone]   Fax:  [phone]  Physical Therapy Treatment  Patient Details  Name: Krista [name] MRN: [id number] Date of Birth: 1961/05/23 Referring Provider (PT): Willey Blade   Encounter Date: 09/29/2019  PT End of Session - 09/29/19 1322    Visit Number  21    Number of Visits  25    Date for PT Re-Evaluation  02/06/93   90 day cert but 60 day plan due to scheduling   Authorization Type  Medicaid    Authorization Time Period  8 visits 06/29/1212/20; additional approved 4 visits 07/28/2019-08/10/2019, 12 visits 1/13-2/23/2021    Authorization - Visit Number  8    Authorization - Number of Visits  12    PT Start Time  8546    PT Stop Time  1356    PT Time Calculation (min)  39 min    Equipment Utilized During Treatment  Gait belt    Activity Tolerance  Patient tolerated treatment well;Patient limited by fatigue    Behavior During Therapy  [facility] for tasks assessed/performed       Past Medical History:  Diagnosis Date  . Arthritis   . CAD (coronary artery disease)   . Depression   . Diabetes mellitus   . Hypertension   . NSTEMI (non-ST elevated myocardial infarction) [facility])     Past Surgical History:  Procedure Laterality Date  . ANKLE SURGERY     left ankle - was broken  . CORONARY STENT INTERVENTION N/A 12/16/2018   Procedure: CORONARY STENT INTERVENTION;  Surgeon: Leonie Man, MD;  Location: [facility];  Service: Cardiovascular;  Laterality: N/A;  LAD  . LEFT HEART CATH AND CORONARY ANGIOGRAPHY N/A 12/16/2018   Procedure: LEFT HEART CATH AND CORONARY ANGIOGRAPHY;  Surgeon: Leonie Man, MD;  Location: [facility];  Service: Cardiovascular;  Laterality: N/A;    Vitals:   09/29/19 1320  BP: 132/82    Subjective Assessment - 09/29/19 1320    Subjective  Pt went to urgent care yesterday as she still had not received  her BP or diabetic meds the lsat 3 weeks despite trying to get MD to refill. She developed headache with elevated BP. Was able to get meds filled at urgent care and feeling better now.    Pertinent History  59 y/o female hospitalized 9/29 to 10/2 due to rhabdomyolysis. Presented with chest pain after fall.PMH: MI, CAD s/p stent placement, AKI, DM, HTN, hyperlipidemia    Patient Stated Goals  Pt would like to get better to be able to move easier with less pain and more energy.    Currently in Pain?  Yes    Pain Score  8     Pain Location  Knee    Pain Orientation  Right;Left    Pain Descriptors / Indicators  Aching    Pain Onset  More than a month ago    Pain Frequency  Constant                       [facility] Adult PT Treatment/Exercise - 09/29/19 1325      Transfers   Transfers  Sit to Stand;Stand to Sit    Sit to Stand  6: Modified independent (Device/Increase time)    Stand to Sit  6: Modified independent (Device/Increase time)    Comments  30 sec sit to stand=7 reps from mat  with UE support      Ambulation/Gait   Ambulation/Gait  Yes    Ambulation/Gait Assistance  6: Modified independent (Device/Increase time)    Ambulation Distance (Feet)  230 Feet    Assistive device  Rolling walker    Gait Pattern  Step-through pattern    Ambulation Surface  Level;Indoor    Ramp  6: Modified independent (Device)   x 2 with RW   Curb  6: Modified independent (Device/increase time)   x 2 with RW   Gait Comments  Pt ambulated for 5 min at end of session covering 575' with RW mod I.  HR=80 after      Neuro Re-ed    Neuro Re-ed Details   Gait with RW around obstacles weaving in and out of 4 cones and walking over red mat x 2 then walking over mat and stepping over 4 cones x 2 with RW supervision. Standing at walker: alternating toe taps on cone x 10 with right UE support only.      Exercises   Exercises  Other Exercises    Other Exercises   Standing at walker with 2# ankle weight  on ankles: marching x 10 bilateral, hip abd x 10 bilateral, hip extension x 10 bilateral, hamstring curl x 10 bilateral. Seated rest break half way through. Verbal cues for erect posture with exercises.             PT Education - 09/29/19 1457    Education Details  Pt to continue with current HEP    Person(s) Educated  Patient    Methods  Explanation    Comprehension  Verbalized understanding       PT Short Term Goals - 09/24/19 1438      PT SHORT TERM GOAL #1   Title  Pt will be independent with progressive HEP for strength, balance and functional mobility to continue gains on own.    Baseline  Pt has been performing her exercises everyday and they are starting to get easier.    Time  4    Period  Weeks    Status  Achieved    Target Date  08/15/19      PT SHORT TERM GOAL #2   Title  Pt will increase gait speed from 0.52 to >0.51ms for improved gait safety.    Baseline  0.562m on 07/16/2019, 08/04/2019 gait speed=0.62106m 08/29/19 gait speed=0.93m27m  Time  4    Period  Weeks    Status  Achieved    Target Date  08/15/19      PT SHORT TERM GOAL #3   Title  Pt will decrease TUG form 21 sec to <18 sec for improved balance and decreased fall risk.    Baseline  08/04/2019 TUG=18.5 sec average of 2, 08/29/19 TUG average=19 sec with RW    Time  4    Period  Weeks    Status  Partially Met    Target Date  08/15/19      PT SHORT TERM GOAL #4   Title  Pt will ambulate >250' with FWW mod I for improved household and short community distances.    Baseline  180' mod I with FWW.; 115 ft x 3 reps with seated rest  breaks in between with RW 08/07/2019, 575' mod I with RW on 09/24/19    Time  4    Period  Weeks    Status  Achieved    Target Date  08/15/19  PT Long Term Goals - 09/29/19 1457      PT LONG TERM GOAL #1   Title  Pt will increase 30 sec sit to stand to 7 or more for further improvement in functional strength and mobility.  (Goal date for all LTGs extended, as  weeks remain in POC:  10/03/2019)    Baseline  07/16/2019 pt performed 5 reps, 09/17/19 5 reps, 6 reps 09/22/2019, 7 reps on 09/29/19    Time  8    Period  Weeks    Status  Achieved      PT LONG TERM GOAL #2   Title  Pt will increase gait speed from 0.32ms to >0.72 m/s for improved gait safety in house and community.    Baseline  07/16/2019 gait speed=0.574m. 08/04/2019 gait speed=0.6289m 0.73 m/sec 09/22/2019    Time  8    Period  Weeks    Status  Achieved      PT LONG TERM GOAL #3   Title  Pt will decrease TUG to <15 sec for improved balance and decreased fall risk.    Baseline  07/16/2019 TUG=21 sec, 08/04/2019 TUG=18.5 sec, 09/17/19 TUG=19 sec    Time  8    Period  Weeks    Status  On-going      PT LONG TERM GOAL #4   Title  Pt will ambulate >400' on varied surfaces with SPC  mod I for improved gait safety.    Baseline  575' on 09/24/19 with RW mod I on level surface, >400' including some nonlevel surfaces like ramp and curb in clinic today    Time  8    Period  Weeks    Status  Achieved      PT LONG TERM GOAL #5   Title  Pt will report <6/10 pain in knees with functional mobility in home for improved function.    Baseline  Not consistently meeting pain goal as varies day to day. Last week ranged from 8-6/10 in knees.    Time  8    Period  Weeks    Status  On-going            Plan - 09/29/19 1458    Clinical Impression Statement  Pt met 30 sec sit to stand and LTG for gait distance with varied surfaces inside today. PT was able to add weight to standing exercises.    Personal Factors and Comorbidities  Comorbidity 3+    Examination-Activity Limitations  Locomotion Level;Transfers;Squat;Stairs;Stand    Examination-Participation Restrictions  Community Activity;Driving;Shop;Meal Prep    Stability/Clinical Decision Making  Evolving/Moderate complexity    Rehab Potential  Good    PT Frequency  2x / week    PT Duration  8 weeks    PT Treatment/Interventions  ADLs/Self Care Home  Management;Aquatic Therapy;DME Instruction;Gait training;Stair training;Functional mobility training;Therapeutic activities;Therapeutic exercise;Balance training;Patient/family education;Neuromuscular re-education;Energy conservation;Vestibular;Passive range of motion;Manual techniques    PT Next Visit Plan  Assess remaining goals. Discharge next visit pending no changes.    PT Home Exercise Plan  Access Code: sit to stands plus [email or web address]    Consulted and Agree with Plan of Care  Patient       Patient will benefit from skilled therapeutic intervention in order to improve the following deficits and impairments:  Abnormal gait, Cardiopulmonary status limiting activity, Decreased activity tolerance, Decreased balance, Decreased strength, Decreased mobility, Decreased knowledge of use of DME, Impaired flexibility, Decreased endurance, Difficulty walking, Postural dysfunction, Decreased range of motion  Visit Diagnosis:  Muscle weakness (generalized)  Difficulty in walking, not elsewhere classified     Problem List Patient Active Problem List   Diagnosis Date Noted  . Dyspnea   . Knee pain   . Rhabdomyolysis due to statin therapy   . Demand ischemia ([place])   . Sepsis ([place]) 05/13/2019  . NSTEMI (non-ST elevated myocardial infarction) ([place]) 05/13/2019  . Coronary artery disease 02/12/2019  . Hyperlipidemia 02/12/2019  . Unstable angina ([place])   . Near syncope 12/14/2018  . Chest tightness 12/14/2018  . Prolonged Q-T interval on ECG   . Acute gastroenteritis 08/02/2012  . Dehydration 08/02/2012  . AKI (acute kidney injury) ([place]) 08/02/2012  . Hypokalemia 08/02/2012  . Diabetes mellitus ([place]) 08/02/2012  . HTN (hypertension) 08/02/2012  . Tobacco abuse 08/02/2012  . ALLERGIC RHINITIS WITH CONJUNCTIVITIS 10/21/2010  . ROTATOR CUFF SYNDROME, RIGHT 10/21/2010  . SEBACEOUS CYST, INFECTED 01/31/2010  . GUAIAC POSITIVE STOOL 10/05/2009  . TOBACCO ABUSE 09/21/2009  . RECTAL BLEEDING, HX OF  09/21/2009  . Nonspecific (abnormal) findings on radiological and other examination of body structure 05/19/2009  . RIGHT LOWER LUNG FIELD CRACKLES 05/19/2009  . Diabetes ([place]) 02/12/2009  . Pain in limb 02/12/2009  . DENTAL CARIES 04/21/2008  . HYPERTENSION 05/01/2007    Electa Sniff, PT, DPT, NCS 09/29/2019, 3:00 PM  [facility] [address] [place], [place], [address] Phone: [phone]   Fax:  [phone]  Name: ELIZABTH PALKA MRN: [id number] Date of Birth: Dec 14, 1960

## 2019-10-01 ENCOUNTER — Other Ambulatory Visit: Payer: Self-pay

## 2019-10-01 ENCOUNTER — Ambulatory Visit: Payer: Medicaid Other

## 2019-10-01 DIAGNOSIS — M6281 Muscle weakness (generalized): Secondary | ICD-10-CM | POA: Diagnosis not present

## 2019-10-01 DIAGNOSIS — R262 Difficulty in walking, not elsewhere classified: Secondary | ICD-10-CM

## 2019-10-01 NOTE — Therapy (Signed)
[facility] [address] [place], [place], [address] Phone: [phone]   Fax:  [phone]  Physical Therapy Treatment/ Discharge Summary  Patient Details  Name: Krista [name] MRN: [id number] Date of Birth: 17-Dec-1960 Referring Provider (PT): Willey Blade  PHYSICAL THERAPY DISCHARGE SUMMARY  Visits from Start of Care: 22  Current functional level related to goals / functional outcomes: See clinical impression and goals for more information. Pt is ambulating mod I level with RW household and short community distances.   Remaining deficits: Pt continues to report pain levels 8/10 in knees. She is fearful of falling and wants to continue to use walker at this time.  Education / Equipment: HEP  Plan: Patient agrees to discharge.  Patient goals were not met. Patient is being discharged due to                                                     ????Plateaued with further progress on remaining goals and safe for household mobility and short community distances.   ?           Encounter Date: 10/01/2019  PT End of Session - 10/01/19 1237    Visit Number  22    Number of Visits  25    Date for PT Re-Evaluation  21/19/41   90 day cert but 60 day plan due to scheduling   Authorization Type  Medicaid    Authorization Time Period  8 visits 06/29/1212/20; additional approved 4 visits 07/28/2019-08/10/2019, 12 visits 1/13-2/23/2021    Authorization - Visit Number  9    Authorization - Number of Visits  12    PT Start Time  7408    PT Stop Time  1448   d/c visit so full time not needed   PT Time Calculation (min)  28 min    Equipment Utilized During Treatment  Gait belt    Activity Tolerance  Patient tolerated treatment well;Patient limited by fatigue    Behavior During Therapy  WFL for tasks assessed/performed       Past Medical History:  Diagnosis Date  . Arthritis   . CAD (coronary artery disease)   .  Depression   . Diabetes mellitus   . Hypertension   . NSTEMI (non-ST elevated myocardial infarction) [facility])     Past Surgical History:  Procedure Laterality Date  . ANKLE SURGERY     left ankle - was broken  . CORONARY STENT INTERVENTION N/A 12/16/2018   Procedure: CORONARY STENT INTERVENTION;  Surgeon: Leonie Man, MD;  Location: [facility];  Service: Cardiovascular;  Laterality: N/A;  LAD  . LEFT HEART CATH AND CORONARY ANGIOGRAPHY N/A 12/16/2018   Procedure: LEFT HEART CATH AND CORONARY ANGIOGRAPHY;  Surgeon: Leonie Man, MD;  Location: [facility];  Service: Cardiovascular;  Laterality: N/A;    There were no vitals filed for this visit.  Subjective Assessment - 10/01/19 1236    Subjective  Pt reports that she is doing the same. Reports balance still a little off and has to hold when she is putting clothes on.    Pertinent History  59 y/o female hospitalized 9/29 to 10/2 due to rhabdomyolysis. Presented with chest pain after fall.PMH: MI, CAD s/p stent placement, AKI, DM, HTN, hyperlipidemia    Patient Stated  Goals  Pt would like to get better to be able to move easier with less pain and more energy.    Currently in Pain?  Yes    Pain Score  8     Pain Location  Knee    Pain Orientation  Right;Left    Pain Descriptors / Indicators  Aching    Pain Onset  More than a month ago                       [facility] Adult PT Treatment/Exercise - 10/01/19 1238      Ambulation/Gait   Ambulation/Gait  Yes    Ambulation/Gait Assistance  6: Modified independent (Device/Increase time)    Ambulation Distance (Feet)  805 Feet    Assistive device  Rolling walker    Gait Pattern  Step-through pattern    Ambulation Surface  Level;Indoor    Gait Comments  HR=92 after gait. Pt reported being tired but did well carrying on conversation throughout.      Standardized Balance Assessment   Standardized Balance Assessment  Timed Up and Go Test      Timed Up and Go  Test   TUG  Normal TUG    Normal TUG (seconds)  16   average of 2 trials: 17 and 15 sec     Neuro Re-ed    Neuro Re-ed Details   Standing on airex: feet apart with eyes open x 30 sec, eyes closed x 30 sec with increased sway, standing with head turns right/left and up/down x 10. Supervision for safety. Pt to continue to perform in corner at home for safety.      Exercises   Exercises  Other Exercises    Other Exercises   In // bars: side stepping 6' x 6 with light UE support, marching gait 6' x 6 with light UE support 1st 2 laps then no UE suport last 1 with minor unsteadiness noted. Tandem gait with right UE support only 6' x 6             PT Education - 10/01/19 1310    Education Details  Discussed plan to d/c today. Pt to continue with current HEP and walking program. 6 min walking at a time daily for longer walk gradually increasing.    Person(s) Educated  Patient    Methods  Explanation    Comprehension  Verbalized understanding       PT Short Term Goals - 10/01/19 1248      PT SHORT TERM GOAL #1   Title  Pt will be independent with progressive HEP for strength, balance and functional mobility to continue gains on own.    Baseline  Pt has been performing her exercises everyday and they are starting to get easier.    Time  4    Period  Weeks    Status  Achieved    Target Date  08/15/19      PT SHORT TERM GOAL #2   Title  Pt will increase gait speed from 0.52 to >0.13ms for improved gait safety.    Baseline  0.543m on 07/16/2019, 08/04/2019 gait speed=0.6210m 08/29/19 gait speed=0.80m57m  Time  4    Period  Weeks    Status  Achieved    Target Date  08/15/19      PT SHORT TERM GOAL #3   Title  Pt will decrease TUG form 21 sec to <18 sec for improved balance and decreased fall  risk.    Baseline  08/04/2019 TUG=18.5 sec average of 2, 08/29/19 TUG average=19 sec with RW, TUG=16 sec on 10/01/19    Time  4    Period  Weeks    Status  Achieved    Target Date  08/15/19       PT SHORT TERM GOAL #4   Title  Pt will ambulate >250' with FWW mod I for improved household and short community distances.    Baseline  180' mod I with FWW.; 115 ft x 3 reps with seated rest  breaks in between with RW 08/07/2019, 575' mod I with RW on 09/24/19    Time  4    Period  Weeks    Status  Achieved    Target Date  08/15/19        PT Long Term Goals - 10/01/19 1249      PT LONG TERM GOAL #1   Title  Pt will increase 30 sec sit to stand to 7 or more for further improvement in functional strength and mobility.  (Goal date for all LTGs extended, as weeks remain in POC:  10/03/2019)    Baseline  07/16/2019 pt performed 5 reps, 09/17/19 5 reps, 6 reps 09/22/2019, 7 reps on 09/29/19    Time  8    Period  Weeks    Status  Achieved      PT LONG TERM GOAL #2   Title  Pt will increase gait speed from 0.3ms to >0.72 m/s for improved gait safety in house and community.    Baseline  07/16/2019 gait speed=0.555m. 08/04/2019 gait speed=0.6277m 0.73 m/sec 09/22/2019    Time  8    Period  Weeks    Status  Achieved      PT LONG TERM GOAL #3   Title  Pt will decrease TUG to <15 sec for improved balance and decreased fall risk.    Baseline  07/16/2019 TUG=21 sec, 08/04/2019 TUG=18.5 sec, 09/17/19 TUG=19 sec, TUG=16 sec on 10/01/19    Time  8    Period  Weeks    Status  Partially Met      PT LONG TERM GOAL #4   Title  Pt will ambulate >400' on varied surfaces with SPC  mod I for improved gait safety.    Baseline  575' on 09/24/19 with RW mod I on level surface, >400' including some nonlevel surfaces like ramp and curb in clinic today with RW mod I. Pt does not feel comfortable trying cane.    Time  8    Period  Weeks    Status  Not Met      PT LONG TERM GOAL #5   Title  Pt will report <6/10 pain in knees with functional mobility in home for improved function.    Baseline  Not consistently meeting pain goal as varies day to day. Last week ranged from 8-6/10 in knees.    Time  8    Period   Weeks    Status  Not Met            Plan - 10/01/19 1311    Clinical Impression Statement  Pt's goals reassessed today. Pt has met all goals except for LTG for TUG( missing by 1), continues to have pain levels around 8/10 consistently in knees, and [facility] feels safer with continued walker use as fearful of falling. Pt has increased gait distance significantly with RW at mod I level.  She has been performing HEP at home and willl continue to do so. PT discharging today as planned and patient in agreement.    Personal Factors and Comorbidities  Comorbidity 3+    Examination-Activity Limitations  Locomotion Level;Transfers;Squat;Stairs;Stand    Examination-Participation Restrictions  Community Activity;Driving;Shop;Meal Prep    Stability/Clinical Decision Making  Evolving/Moderate complexity    Rehab Potential  Good    PT Frequency  2x / week    PT Duration  8 weeks    PT Treatment/Interventions  ADLs/Self Care Home Management;Aquatic Therapy;DME Instruction;Gait training;Stair training;Functional mobility training;Therapeutic activities;Therapeutic exercise;Balance training;Patient/family education;Neuromuscular re-education;Energy conservation;Vestibular;Passive range of motion;Manual techniques    PT Next Visit Plan  Discharged today    PT Home Exercise Plan  Access Code: sit to stands plus [email or web address]    Consulted and Agree with Plan of Care  Patient       Patient will benefit from skilled therapeutic intervention in order to improve the following deficits and impairments:  Abnormal gait, Cardiopulmonary status limiting activity, Decreased activity tolerance, Decreased balance, Decreased strength, Decreased mobility, Decreased knowledge of use of DME, Impaired flexibility, Decreased endurance, Difficulty walking, Postural dysfunction, Decreased range of motion  Visit Diagnosis: Muscle weakness (generalized)  Difficulty in walking, not elsewhere  classified     Problem List Patient Active Problem List   Diagnosis Date Noted  . Dyspnea   . Knee pain   . Rhabdomyolysis due to statin therapy   . Demand ischemia ([place])   . Sepsis ([place]) 05/13/2019  . NSTEMI (non-ST elevated myocardial infarction) ([place]) 05/13/2019  . Coronary artery disease 02/12/2019  . Hyperlipidemia 02/12/2019  . Unstable angina ([place])   . Near syncope 12/14/2018  . Chest tightness 12/14/2018  . Prolonged Q-T interval on ECG   . Acute gastroenteritis 08/02/2012  . Dehydration 08/02/2012  . AKI (acute kidney injury) ([place]) 08/02/2012  . Hypokalemia 08/02/2012  . Diabetes mellitus ([place]) 08/02/2012  . HTN (hypertension) 08/02/2012  . Tobacco abuse 08/02/2012  . ALLERGIC RHINITIS WITH CONJUNCTIVITIS 10/21/2010  . ROTATOR CUFF SYNDROME, RIGHT 10/21/2010  . SEBACEOUS CYST, INFECTED 01/31/2010  . GUAIAC POSITIVE STOOL 10/05/2009  . TOBACCO ABUSE 09/21/2009  . RECTAL BLEEDING, HX OF 09/21/2009  . Nonspecific (abnormal) findings on radiological and other examination of body structure 05/19/2009  . RIGHT LOWER LUNG FIELD CRACKLES 05/19/2009  . Diabetes ([place]) 02/12/2009  . Pain in limb 02/12/2009  . DENTAL CARIES 04/21/2008  . HYPERTENSION 05/01/2007    Electa Sniff, PT, DPT, NCS 10/01/2019, 1:15 PM  [facility] [address] [place], [place], [address] Phone: [phone]   Fax:  [phone]  Name: Krista [name] MRN: [id number] Date of Birth: 1961-01-30

## 2019-10-06 ENCOUNTER — Telehealth: Payer: Self-pay

## 2019-10-06 NOTE — Telephone Encounter (Signed)
Called and spoke w/pt regarding the denial from medicaid for the repatha 140mg . I instructed the pt to call us back to set up a time to meet with me and come back to bring the signed denial letter to appeal. The pt voiced understanding that the appeal cannot be overturned unless they bring in the signed denial form.

## 2019-10-28 ENCOUNTER — Other Ambulatory Visit: Payer: Self-pay

## 2019-10-28 ENCOUNTER — Ambulatory Visit (INDEPENDENT_AMBULATORY_CARE_PROVIDER_SITE_OTHER): Payer: Medicaid Other | Admitting: Pharmacist

## 2019-10-28 DIAGNOSIS — T466X5A Adverse effect of antihyperlipidemic and antiarteriosclerotic drugs, initial encounter: Secondary | ICD-10-CM

## 2019-10-28 DIAGNOSIS — E782 Mixed hyperlipidemia: Secondary | ICD-10-CM | POA: Diagnosis not present

## 2019-10-28 DIAGNOSIS — G720 Drug-induced myopathy: Secondary | ICD-10-CM

## 2019-10-28 MED ORDER — NITROGLYCERIN 0.4 MG SL SUBL: 0.4000 mg | SUBLINGUAL_TABLET | SUBLINGUAL | 2 refills | Status: AC | PRN

## 2019-10-28 MED ORDER — LORATADINE 10 MG PO TABS: 10.0000 mg | ORAL_TABLET | Freq: Every day | ORAL | 11 refills | Status: DC | PRN

## 2019-10-28 MED ORDER — REPATHA SURECLICK 140 MG/ML ~~LOC~~ SOAJ: 140.0000 mg | SUBCUTANEOUS | 0 refills | Status: DC

## 2019-10-28 NOTE — Progress Notes (Signed)
Patient ID: Krista [name]                 DOB: 1960/10/06                    MRN: [id number]     HPI: Krista [name] is a 59 y.o. female patient referred to lipid clinic by Dr Gwenlyn Found. PMH is significant for CAD s/p NSTEMI,  tobacco use, hypertension, diabetes, and hyperlipidemia. Noted history of rhabdomyolysis with pravastatin and intolerance to other statins as well as ezetimibe.   Current Medications: none  Intolerances:  Atorvastatin 40mg  - itching Ezetimibe 10mg  - itching Pravastatin - rhabdomyolysis  LDL goal: 70mg /dL  Diet:baked most of her food and eats a balaced diet  Exercise: needs walker for ambulation,completed PT, walks at home and march in place and uses bands for strenght  Family History: mother is on Repatha d/t elevated hx of ASCVD  Social History: current smoker and working on decreasing amount  Labs: 09/22/2019: CHO 170, TG 83, HDL 41, LDL 113 (no therapy and rabdo with pravastatin)  Past Medical History:  Diagnosis Date  . Arthritis   . CAD (coronary artery disease)   . Depression   . Diabetes mellitus   . Hypertension   . NSTEMI (non-ST elevated myocardial infarction) [facility])     Current Outpatient Medications on File Prior to Visit  Medication Sig Dispense Refill  . acetaminophen (TYLENOL) 500 MG tablet Take 1,000 mg by mouth every 6 (six) hours as needed for mild pain or headache.    [name] aspirin EC 81 MG EC tablet Take 1 tablet (81 mg total) by mouth daily. 30 tablet 0  . clopidogrel (PLAVIX) 75 MG tablet Take 1 tablet (75 mg total) by mouth daily. 90 tablet 3  . diltiazem (DILACOR XR) 180 MG 24 hr capsule Take 2 capsules (360 mg total) by mouth daily. 60 capsule 1  . ergocalciferol (VITAMIN D2) 1.25 MG (50000 UT) capsule Take 50,000 Units by mouth once a week.    [name] glimepiride (AMARYL) 2 MG tablet Take 1 tablet (2 mg total) by mouth daily with breakfast. 30 tablet 1  . metFORMIN (GLUCOPHAGE) 1000 MG tablet Take 1,000 mg by mouth 2 (two) times daily  with a meal.     No current facility-administered medications on file prior to visit.    Allergies  Allergen Reactions  . Statins     itching  . Zetia [Ezetimibe] Itching    Statin myopathy LDL remains above goal for secondary prevention. Patient is unable to tolerate high intensity statin like atorvastatin. Also failed trial with ezetimibe 10mg  daily. Noted history of rhabdomyolysis with pravastatin as well. PCSK9i are an appropriate therapy as this time to decrease cardiovascular risk and reach desired LDL < 70mg /dL.  Repatha/Pravastatin therapy discussed with patient. Indication, administration, storage, common side effects and prior authorization process was explained to patient. She is familiar with Repatha because her mother is a current user of the medication. Will start paperwork for Repatha 140mg  SureClick every 14 days and contact follow up as needed.     Mirza Fessel Rodriguez-Guzman PharmD, BCPS, [place] [address] 11/05/2019 9:47 AM

## 2019-10-28 NOTE — Patient Instructions (Signed)
[facility]id Clinic (pharmacist) [phone] Georgiann Neider/Kristin/Haliegh  *START paperwork for Repatha/Praluent approval* *Call back if additional assistance needed* *Repeat fasting blood work 6-8 weeks after starting new medication*   High Cholesterol  High cholesterol is a condition in which the blood has high levels of a white, waxy, fat-like substance (cholesterol). The human body needs small amounts of cholesterol. The liver makes all the cholesterol that the body needs. Extra (excess) cholesterol comes from the food that we eat. Cholesterol is carried from the liver by the blood through the blood vessels. If you have high cholesterol, deposits (plaques) may build up on the walls of your blood vessels (arteries). Plaques make the arteries narrower and stiffer. Cholesterol plaques increase your risk for heart attack and stroke. Work with your health care provider to keep your cholesterol levels in a healthy range. What increases the risk? This condition is more likely to develop in people who:  Eat foods that are high in animal fat (saturated fat) or cholesterol.  Are overweight.  Are not getting enough exercise.  Have a family history of high cholesterol. What are the signs or symptoms? There are no symptoms of this condition. How is this diagnosed? This condition may be diagnosed from the results of a blood test.  If you are older than age 14, your health care provider may check your cholesterol every 4-6 years.  You may be checked more often if you already have high cholesterol or other risk factors for heart disease. The blood test for cholesterol measures:  "Bad" cholesterol (LDL cholesterol). This is the main type of cholesterol that causes heart disease. The desired level for LDL is less than 100.  "Good" cholesterol (HDL cholesterol). This type helps to protect against heart disease by cleaning the arteries and carrying the LDL away. The desired level for HDL is 60 or  higher.  Triglycerides. These are fats that the body can store or burn for energy. The desired number for triglycerides is lower than 150.  Total cholesterol. This is a measure of the total amount of cholesterol in your blood, including LDL cholesterol, HDL cholesterol, and triglycerides. A healthy number is less than 200. How is this treated? This condition is treated with diet changes, lifestyle changes, and medicines. Diet changes  This may include eating more whole grains, fruits, vegetables, nuts, and fish.  This may also include cutting back on red meat and foods that have a lot of added sugar. Lifestyle changes  Changes may include getting at least 40 minutes of aerobic exercise 3 times a week. Aerobic exercises include walking, biking, and swimming. Aerobic exercise along with a healthy diet can help you maintain a healthy weight.  Changes may also include quitting smoking. Medicines  Medicines are usually given if diet and lifestyle changes have failed to reduce your cholesterol to healthy levels.  Your health care provider may prescribe a statin medicine. Statin medicines have been shown to reduce cholesterol, which can reduce the risk of heart disease. Follow these instructions at home: Eating and drinking If told by your health care provider:  Eat chicken (without skin), fish, veal, shellfish, ground [place] breast, and round or loin cuts of red meat.  Do not eat fried foods or fatty meats, such as hot dogs and salami.  Eat plenty of fruits, such as apples.  Eat plenty of vegetables, such as broccoli, potatoes, and carrots.  Eat beans, peas, and lentils.  Eat grains such as barley, rice, couscous, and bulgur wheat.  Eat pasta without  cream sauces.  Use skim or nonfat milk, and eat low-fat or nonfat yogurt and cheeses.  Do not eat or drink whole milk, cream, ice cream, egg yolks, or hard cheeses.  Do not eat stick margarine or tub margarines that contain trans  fats (also called partially hydrogenated oils).  Do not eat saturated tropical oils, such as coconut oil and palm oil.  Do not eat cakes, cookies, crackers, or other baked goods that contain trans fats.  General instructions  Exercise as directed by your health care provider. Increase your activity level with activities such as gardening, walking, and taking the stairs.  Take over-the-counter and prescription medicines only as told by your health care provider.  Do not use any products that contain nicotine or tobacco, such as cigarettes and e-cigarettes. If you need help quitting, ask your health care provider.  Keep all follow-up visits as told by your health care provider. This is important. Contact a health care provider if:  You are struggling to maintain a healthy diet or weight.  You need help to start on an exercise program.  You need help to stop smoking. Get help right away if:  You have chest pain.  You have trouble breathing. This information is not intended to replace advice given to you by your health care provider. Make sure you discuss any questions you have with your health care provider. Document Revised: 08/03/2017 Document Reviewed: 01/29/2016 Elsevier Patient Education  [place].

## 2019-11-05 ENCOUNTER — Encounter: Payer: Self-pay | Admitting: Pharmacist

## 2019-11-05 DIAGNOSIS — G720 Drug-induced myopathy: Secondary | ICD-10-CM | POA: Insufficient documentation

## 2019-11-05 NOTE — Assessment & Plan Note (Signed)
LDL remains above goal for secondary prevention. Patient is unable to tolerate high intensity statin like atorvastatin. Also failed trial with ezetimibe 10mg  daily. Noted history of rhabdomyolysis with pravastatin as well. PCSK9i are an appropriate therapy as this time to decrease cardiovascular risk and reach desired LDL < 70mg /dL.  Repatha/Pravastatin therapy discussed with patient. Indication, administration, storage, common side effects and prior authorization process was explained to patient. She is familiar with Repatha because her mother is a current user of the medication. Will start paperwork for Repatha 140mg  SureClick every 14 days and contact follow up as needed.

## 2019-11-10 ENCOUNTER — Telehealth: Payer: Self-pay

## 2019-11-10 DIAGNOSIS — G720 Drug-induced myopathy: Secondary | ICD-10-CM

## 2019-11-10 MED ORDER — REPATHA SURECLICK 140 MG/ML ~~LOC~~ SOAJ: 140.0000 mg | SUBCUTANEOUS | 11 refills | Status: DC

## 2019-11-10 NOTE — Telephone Encounter (Signed)
CALLED AND SPOKE W/PT REGARDING THE APPROVAL OF THE REPATHA AND RX SENT. PT VOICED UNDERSTANDING

## 2019-11-17 ENCOUNTER — Telehealth: Payer: Self-pay | Admitting: Cardiovascular Disease

## 2019-11-17 NOTE — Telephone Encounter (Signed)
New Message  Syid from [employer] is calling in about medical records. Transferred call to [employer] at [phone]

## 2020-01-06 ENCOUNTER — Telehealth: Payer: Self-pay | Admitting: Physician Assistant

## 2020-01-06 NOTE — Telephone Encounter (Signed)
Patient returned phone call. I attempted to make her aware that she was called to schedule a follow up appointment. However, I did not receive a response from the patient - call may have dropped. Please schedule follow up appointment from [facility] recall if the patient calls back.

## 2020-01-06 NOTE — Telephone Encounter (Signed)
Attempted to contact patient 01/06/20 o schedule follow up from [facility] recall list, no answer left message

## 2020-01-07 ENCOUNTER — Other Ambulatory Visit: Payer: Self-pay | Admitting: Family Medicine

## 2020-02-27 ENCOUNTER — Other Ambulatory Visit: Payer: Self-pay | Admitting: Cardiovascular Disease

## 2020-03-03 ENCOUNTER — Ambulatory Visit: Payer: Medicaid Other | Admitting: Physician Assistant

## 2020-03-30 ENCOUNTER — Other Ambulatory Visit: Payer: Self-pay | Admitting: Cardiovascular Disease

## 2020-04-06 ENCOUNTER — Other Ambulatory Visit: Payer: Self-pay | Admitting: Cardiovascular Disease

## 2020-04-09 ENCOUNTER — Other Ambulatory Visit: Payer: Self-pay

## 2020-04-09 ENCOUNTER — Ambulatory Visit (INDEPENDENT_AMBULATORY_CARE_PROVIDER_SITE_OTHER): Payer: Medicaid Other | Admitting: Physician Assistant

## 2020-04-09 ENCOUNTER — Encounter: Payer: Self-pay | Admitting: Physician Assistant

## 2020-04-09 VITALS — BP 162/102 | HR 84 | Ht 63.0 in | Wt 179.4 lb

## 2020-04-09 DIAGNOSIS — I2510 Atherosclerotic heart disease of native coronary artery without angina pectoris: Secondary | ICD-10-CM | POA: Diagnosis not present

## 2020-04-09 DIAGNOSIS — I10 Essential (primary) hypertension: Secondary | ICD-10-CM | POA: Diagnosis not present

## 2020-04-09 DIAGNOSIS — E119 Type 2 diabetes mellitus without complications: Secondary | ICD-10-CM

## 2020-04-09 DIAGNOSIS — R55 Syncope and collapse: Secondary | ICD-10-CM

## 2020-04-09 DIAGNOSIS — Z79899 Other long term (current) drug therapy: Secondary | ICD-10-CM

## 2020-04-09 DIAGNOSIS — E782 Mixed hyperlipidemia: Secondary | ICD-10-CM

## 2020-04-09 MED ORDER — DILTIAZEM HCL ER 180 MG PO CP24: 360.0000 mg | ORAL_CAPSULE | Freq: Every day | ORAL | 3 refills | Status: DC

## 2020-04-09 NOTE — Progress Notes (Signed)
Cardiology Office Note:    Date:  04/11/2020   ID:  Krista [name], DOB [birth year], MRN [id number]  PCP:  Lang Snow, [facility] Cardiologist:  Quay Burow, MD  [place] Electrophysiologist:  None   Referring MD: Lang Snow, FNP   Chief Complaint  Patient presents with  . Follow-up    seen for Dr. Gwenlyn Found    History of Present Illness:    Krista [name] is a 59 y.o. female with a hx of hypertension, tobacco abuse, DM2, syncope and CAD. She was admitted on 12/13/2018 with sudden onset of dizziness, shortness of breath and palpitation. She also complained of tightness in the throat. Apparently patient did pass out and symptoms suggest a either vagal event or arrhythmia.Cardiology was consulted for abnormal EKG which showed anterolateral T wave inversion. Initial echocardiogram obtained on 12/14/2018 showed EF 50 to 55%, hypokinesis in the distal inferior and inferolateral wall, no significant valvular issue. Given abnormal EKG and wall motion abnormality seen on echocardiogram, patient ultimately underwent cardiac catheterization on 12/16/2018 which revealed a severe 99% proximal to mid LAD successfully treated with 2.7 x 20 mm Synergy DES, normal LVEDP, normal EF. She also had 60% proximal RCA lesion, 65% RPDA lesion, 85% ostial ramus lesion. It was recommended to manage the RCA disease medically since she had diffuse disease which may require extensive amount of stents if PCI is needed. Postprocedure, patient was placed on aspirin and Brilinta with recommendation to continue dual antiplatelet therapy for at least 1 year. During the hospitalization, troponin peaked at 7.57 before trending back down. Lipid panel obtained during this hospitalization showed LDL 112, normal cholesterol, HDL and triglyceride. She was also placed on high-dose statin. Given her syncope, no driving for 6 months was recommended.   She was discharged with a 30-day event monitor.  Her  monitor did not show any arrhythmia.  Due to the cost of Brilinta, she has been switched to Plavix.  She was admitted to the hospital in September 2020 for fever of unknown cause.  Covid test was negative, white blood cell count was 13.4.  She was tachycardic with heart rate of 120 and a temperature of 102.  Troponin was elevated at 865, creatinine went up to 2.21.  She was admitted for sepsis.  Repeat echocardiogram performed on 05/13/2019 showed EF 60 to 65%, grade 2 DD, moderate to severe LVH, mildly dilated ascending aorta.  She also had rhabdomyolysis with elevated CK.  Statin was held.  She was later started on Repatha.   She presents today for 51-month follow-up. She denies any recent chest pain or shortness of breath. She has no lower extremity edema, orthopnea or PND. She is due for repeat CMP and fasting lipid panel.    Past Medical History:  Diagnosis Date  . Arthritis   . CAD (coronary artery disease)   . Depression   . Diabetes mellitus   . Hypertension   . NSTEMI (non-ST elevated myocardial infarction) [facility])     Past Surgical History:  Procedure Laterality Date  . ANKLE SURGERY     left ankle - was broken  . CORONARY STENT INTERVENTION N/A 12/16/2018   Procedure: CORONARY STENT INTERVENTION;  Surgeon: Leonie Man, MD;  Location: [facility];  Service: Cardiovascular;  Laterality: N/A;  LAD  . LEFT HEART CATH AND CORONARY ANGIOGRAPHY N/A 12/16/2018   Procedure: LEFT HEART CATH AND CORONARY ANGIOGRAPHY;  Surgeon: Leonie Man, MD;  Location: [facility];  Service: Cardiovascular;  Laterality: N/A;    Current Medications: Current Meds  Medication Sig  . acetaminophen (TYLENOL) 500 MG tablet Take 1,000 mg by mouth every 6 (six) hours as needed for mild pain or headache.  [name] aspirin EC 81 MG EC tablet Take 1 tablet (81 mg total) by mouth daily.  . clopidogrel (PLAVIX) 75 MG tablet Take 1 tablet (75 mg total) by mouth daily.  . ergocalciferol (VITAMIN D2) 1.25  MG (50000 UT) capsule Take 50,000 Units by mouth once a week.  . Evolocumab (REPATHA SURECLICK) 376 MG/ML SOAJ Inject 140 mg into the skin every 14 (fourteen) days.  [name] glimepiride (AMARYL) 2 MG tablet Take 1 tablet (2 mg total) by mouth daily with breakfast.  . loratadine (CLARITIN) 10 MG tablet Take 1 tablet (10 mg total) by mouth daily as needed for allergies.  . metFORMIN (GLUCOPHAGE) 1000 MG tablet Take 1,000 mg by mouth 2 (two) times daily with a meal.  . nitroGLYCERIN (NITROSTAT) 0.4 MG SL tablet Place 1 tablet (0.4 mg total) under the tongue every 5 (five) minutes as needed for chest pain.  . [DISCONTINUED] diltiazem (DILACOR XR) 180 MG 24 hr capsule Take 2 capsules (360 mg total) by mouth daily.     Allergies:   Statins and Zetia [ezetimibe]   Social History   Socioeconomic History  . Marital status: Single    Spouse name: Not on file  . Number of children: Not on file  . Years of education: Not on file  . Highest education level: Not on file  Occupational History  . Not on file  Tobacco Use  . Smoking status: Current Every Day Smoker    Packs/day: 0.50    Years: 33.00    Pack years: 16.50    Types: Cigarettes  . Smokeless tobacco: Never Used  Substance and Sexual Activity  . Alcohol use: No  . Drug use: No  . Sexual activity: Yes    Birth control/protection: Post-menopausal  Other Topics Concern  . Not on file  Social History Narrative  . Not on file   Social Determinants of Health   Financial Resource Strain:   . Difficulty of Paying Living Expenses: Not on file  Food Insecurity:   . Worried About Charity fundraiser in the Last Year: Not on file  . Ran Out of Food in the Last Year: Not on file  Transportation Needs:   . Lack of Transportation (Medical): Not on file  . Lack of Transportation (Non-Medical): Not on file  Physical Activity:   . Days of Exercise per Week: Not on file  . Minutes of Exercise per Session: Not on file  Stress:   . Feeling of  Stress : Not on file  Social Connections:   . Frequency of Communication with Friends and Family: Not on file  . Frequency of Social Gatherings with Friends and Family: Not on file  . Attends Religious Services: Not on file  . Active Member of Clubs or Organizations: Not on file  . Attends Archivist Meetings: Not on file  . Marital Status: Not on file     Family History: The patient's family history includes COPD in her mother; Diabetes in her father and mother; Hypertension in her father and mother.  ROS:   Please see the history of present illness.     All other systems reviewed and are negative.  EKGs/Labs/Other Studies Reviewed:    The following studies were reviewed today:  Echo 05/13/2019  1. Left ventricular ejection fraction, by visual estimation, is 60 to  65%. The left ventricle has normal function. Normal left ventricular size.  There is severely increased left ventricular hypertrophy.  2. Left ventricular diastolic Doppler parameters are consistent with  pseudonormalization pattern of LV diastolic filling.  3. Global right ventricle has normal systolic function.The right  ventricular size is normal.  4. Left atrial size was normal.  5. Right atrial size was normal.  6. The mitral valve is normal in structure. No evidence of mitral valve  regurgitation. No evidence of mitral stenosis.  7. The tricuspid valve is normal in structure. Tricuspid valve  regurgitation is mild.  8. The aortic valve is tricuspid Aortic valve regurgitation is trivial by  color flow Doppler. Mild aortic valve sclerosis without stenosis.  9. The pulmonic valve was normal in structure. Pulmonic valve  regurgitation is not visualized by color flow Doppler.  10. Aortic dilatation noted.  11. There is mild dilatation of the ascending aorta measuring 38 mm.  12. Mildly elevated pulmonary artery systolic pressure.  13. The inferior vena cava is dilated in size with >50%  respiratory  variability, suggesting right atrial pressure of 8 mmHg.  14. Normal LV systolic function; grade 2 diastolic dysfunction; moderate  to severe LVH; mildly dilated ascending aorta; trace AI; mild TR.   EKG:  EKG is not ordered today.  The ekg ordered today demonstrates normal sinus rhythm, occasional PAC, no significant ST-T wave changes.  Recent Labs: 05/13/2019: Magnesium 2.0 05/15/2019: Hemoglobin 13.1; Platelets 235 05/16/2019: BUN 5; Creatinine, Ser 0.68; Potassium 3.9; Sodium 139 09/22/2019: ALT 21  Recent Lipid Panel    Component Value Date/Time   CHOL 170 09/22/2019 1419   TRIG 83 09/22/2019 1419   HDL 41 09/22/2019 1419   CHOLHDL 4.1 09/22/2019 1419   CHOLHDL 4.1 12/14/2018 0134   VLDL 14 12/14/2018 0134   LDLCALC 113 (H) 09/22/2019 1419    Physical Exam:    VS:  BP (!) 162/102   Pulse 84   Ht 5\' 3"  (1.6 m)   Wt 179 lb 6.4 oz (81.4 kg)   SpO2 95%   BMI 31.78 kg/m     Wt Readings from Last 3 Encounters:  04/09/20 179 lb 6.4 oz (81.4 kg)  09/05/19 187 lb (84.8 kg)  08/13/19 184 lb 9.6 oz (83.7 kg)     GEN:  Well nourished, well developed in no acute distress HEENT: Normal NECK: No JVD; No carotid bruits LYMPHATICS: No lymphadenopathy CARDIAC: RRR, no murmurs, rubs, gallops RESPIRATORY:  Clear to auscultation without rales, wheezing or rhonchi  ABDOMEN: Soft, non-tender, non-distended MUSCULOSKELETAL:  No edema; No deformity  SKIN: Warm and dry NEUROLOGIC:  Alert and oriented x 3 PSYCHIATRIC:  Normal affect   ASSESSMENT:    1. Coronary artery disease involving native coronary artery of native heart without angina pectoris   2. Mixed hyperlipidemia   3. Essential hypertension   4. Controlled type 2 diabetes mellitus without complication, without long-term current use of insulin ([place])   5. Medication management   6. Syncope and collapse    PLAN:    In order of problems listed above:  1. CAD: Denies any recent chest pain. Continue aspirin and  Plavix. Obtain CMET and FLP  2. Hypertension: Blood pressure stable on current therapy  3. Hyperlipidemia: Continue Repatha. Repeat fasting lipid panel and LFT  4. DM2: Managed by primary care provider  5. History of syncope: Occurred in 2020, no recent recurrence.  Medication Adjustments/Labs and Tests Ordered: Current medicines are reviewed at length with the patient today.  Concerns regarding medicines are outlined above.  Orders Placed This Encounter  Procedures  . Comprehensive Metabolic Panel (CMET)  . Lipid Profile  . EKG 12-Lead   No orders of the defined types were placed in this encounter.   Patient Instructions  Medication Instructions:  Your physician recommends that you continue on your current medications as directed. Please refer to the Current Medication list given to you today.  *If you need a refill on your cardiac medications before your next appointment, please call your pharmacy*   Lab Work: Fasting Lipid Panel and CMET anytime during the next 6 months  If you have labs (blood work) drawn today and your tests are completely normal, you will receive your results only by: [name] MyChart Message (if you have MyChart) OR . A paper copy in the mail If you have any lab test that is abnormal or we need to change your treatment, we will call you to review the results.   Testing/Procedures: none   Follow-Up: At [facility], you and your health needs are our priority.  As part of our continuing mission to provide you with exceptional heart care, we have created designated Provider Care Teams.  These Care Teams include your primary Cardiologist (physician) and Advanced Practice Providers (APPs -  Physician Assistants and Nurse Practitioners) who all work together to provide you with the care you need, when you need it.  We recommend signing up for the patient portal called "MyChart".  Sign up information is provided on this After Visit Summary.  MyChart is used to  connect with patients for Virtual Visits (Telemedicine).  Patients are able to view lab/test results, encounter notes, upcoming appointments, etc.  Non-urgent messages can be sent to your provider as well.   To learn more about what you can do with MyChart, go to [email or web address].    Your next appointment:   6 month(s)  The format for your next appointment:   In Person  Provider:   Quay Burow, MD   Other Instructions      Signed, Almyra Deforest, [place]  04/11/2020 11:14 PM    [place]

## 2020-04-09 NOTE — Patient Instructions (Signed)
Medication Instructions:  Your physician recommends that you continue on your current medications as directed. Please refer to the Current Medication list given to you today.  *If you need a refill on your cardiac medications before your next appointment, please call your pharmacy*   Lab Work: Fasting Lipid Panel and CMET anytime during the next 6 months  If you have labs (blood work) drawn today and your tests are completely normal, you will receive your results only by: [name] MyChart Message (if you have MyChart) OR . A paper copy in the mail If you have any lab test that is abnormal or we need to change your treatment, we will call you to review the results.   Testing/Procedures: none   Follow-Up: At [facility], you and your health needs are our priority.  As part of our continuing mission to provide you with exceptional heart care, we have created designated Provider Care Teams.  These Care Teams include your primary Cardiologist (physician) and Advanced Practice Providers (APPs -  Physician Assistants and Nurse Practitioners) who all work together to provide you with the care you need, when you need it.  We recommend signing up for the patient portal called "MyChart".  Sign up information is provided on this After Visit Summary.  MyChart is used to connect with patients for Virtual Visits (Telemedicine).  Patients are able to view lab/test results, encounter notes, upcoming appointments, etc.  Non-urgent messages can be sent to your provider as well.   To learn more about what you can do with MyChart, go to [email or web address].    Your next appointment:   6 month(s)  The format for your next appointment:   In Person  Provider:   Quay Burow, MD   Other Instructions

## 2020-04-11 ENCOUNTER — Encounter: Payer: Self-pay | Admitting: Physician Assistant

## 2020-04-14 ENCOUNTER — Other Ambulatory Visit: Payer: Self-pay | Admitting: Cardiovascular Disease

## 2020-09-22 ENCOUNTER — Emergency Department (HOSPITAL_COMMUNITY): Payer: Medicaid Other

## 2020-09-22 ENCOUNTER — Emergency Department (HOSPITAL_COMMUNITY)
Admission: EM | Admit: 2020-09-22 | Discharge: 2020-09-22 | Disposition: A | Discharge status: Home / Self Care | Payer: Medicaid Other | Attending: Emergency Medicine | Admitting: Emergency Medicine

## 2020-09-22 ENCOUNTER — Other Ambulatory Visit: Payer: Self-pay

## 2020-09-22 ENCOUNTER — Encounter (HOSPITAL_COMMUNITY): Payer: Self-pay

## 2020-09-22 DIAGNOSIS — N898 Other specified noninflammatory disorders of vagina: Secondary | ICD-10-CM | POA: Insufficient documentation

## 2020-09-22 DIAGNOSIS — I214 Non-ST elevation (NSTEMI) myocardial infarction: Secondary | ICD-10-CM | POA: Diagnosis not present

## 2020-09-22 DIAGNOSIS — I509 Heart failure, unspecified: Secondary | ICD-10-CM | POA: Insufficient documentation

## 2020-09-22 DIAGNOSIS — Z202 Contact with and (suspected) exposure to infections with a predominantly sexual mode of transmission: Secondary | ICD-10-CM | POA: Insufficient documentation

## 2020-09-22 DIAGNOSIS — I110 Hypertensive heart disease with heart failure: Secondary | ICD-10-CM | POA: Diagnosis not present

## 2020-09-22 DIAGNOSIS — Z114 Encounter for screening for human immunodeficiency virus [HIV]: Secondary | ICD-10-CM | POA: Insufficient documentation

## 2020-09-22 DIAGNOSIS — F17210 Nicotine dependence, cigarettes, uncomplicated: Secondary | ICD-10-CM | POA: Insufficient documentation

## 2020-09-22 DIAGNOSIS — Z7982 Long term (current) use of aspirin: Secondary | ICD-10-CM | POA: Diagnosis not present

## 2020-09-22 DIAGNOSIS — Z7984 Long term (current) use of oral hypoglycemic drugs: Secondary | ICD-10-CM | POA: Insufficient documentation

## 2020-09-22 DIAGNOSIS — E119 Type 2 diabetes mellitus without complications: Secondary | ICD-10-CM | POA: Insufficient documentation

## 2020-09-22 DIAGNOSIS — Z9861 Coronary angioplasty status: Secondary | ICD-10-CM | POA: Insufficient documentation

## 2020-09-22 DIAGNOSIS — N179 Acute kidney failure, unspecified: Secondary | ICD-10-CM | POA: Insufficient documentation

## 2020-09-22 DIAGNOSIS — Z79899 Other long term (current) drug therapy: Secondary | ICD-10-CM | POA: Diagnosis not present

## 2020-09-22 DIAGNOSIS — I2510 Atherosclerotic heart disease of native coronary artery without angina pectoris: Secondary | ICD-10-CM | POA: Insufficient documentation

## 2020-09-22 DIAGNOSIS — R1030 Lower abdominal pain, unspecified: Secondary | ICD-10-CM | POA: Diagnosis present

## 2020-09-22 DIAGNOSIS — R109 Unspecified abdominal pain: Secondary | ICD-10-CM

## 2020-09-22 LAB — WET PREP, GENITAL
Clue Cells Wet Prep HPF POC: NONE SEEN
Sperm: NONE SEEN
Trich, Wet Prep: NONE SEEN
Yeast Wet Prep HPF POC: NONE SEEN

## 2020-09-22 LAB — COMPREHENSIVE METABOLIC PANEL
ALT: 10 U/L (ref 0–44)
AST: 13 U/L — ABNORMAL LOW (ref 15–41)
Albumin: 2.9 g/dL — ABNORMAL LOW (ref 3.5–5.0)
Alkaline Phosphatase: 46 U/L (ref 38–126)
Anion gap: 9 (ref 5–15)
BUN: 7 mg/dL (ref 6–20)
CO2: 26 mmol/L (ref 22–32)
Calcium: 8.9 mg/dL (ref 8.9–10.3)
Chloride: 102 mmol/L (ref 98–111)
Creatinine, Ser: 0.83 mg/dL (ref 0.44–1.00)
GFR, Estimated: >60 mL/min (ref >60)
Glucose, Bld: 406 mg/dL — ABNORMAL HIGH (ref 70–99)
Potassium: 3.5 mmol/L (ref 3.5–5.1)
Sodium: 137 mmol/L (ref 135–145)
Total Bilirubin: 0.3 mg/dL (ref 0.3–1.2)
Total Protein: 6.3 g/dL — ABNORMAL LOW (ref 6.5–8.1)

## 2020-09-22 LAB — URINALYSIS, MICROSCOPIC (REFLEX): Squamous Epithelial / HPF: NONE SEEN (ref 0–5)

## 2020-09-22 LAB — URINALYSIS, ROUTINE W REFLEX MICROSCOPIC
Bilirubin Urine: NEGATIVE
Glucose, UA: >=500 mg/dL — AB
Hgb urine dipstick: NEGATIVE
Ketones, ur: NEGATIVE mg/dL
Leukocytes,Ua: NEGATIVE
Nitrite: NEGATIVE
Protein, ur: NEGATIVE mg/dL
Specific Gravity, Urine: <1.005 — ABNORMAL LOW (ref 1.005–1.030)
pH: 6 (ref 5.0–8.0)

## 2020-09-22 LAB — CBC
HCT: 39.2 % (ref 36.0–46.0)
Hemoglobin: 13 g/dL (ref 12.0–15.0)
MCH: 28.7 pg (ref 26.0–34.0)
MCHC: 33.2 g/dL (ref 30.0–36.0)
MCV: 86.5 fL (ref 80.0–100.0)
Platelets: 212 10*3/uL (ref 150–400)
RBC: 4.53 MIL/uL (ref 3.87–5.11)
RDW: 14 % (ref 11.5–15.5)
WBC: 6.4 10*3/uL (ref 4.0–10.5)
nRBC: 0 % (ref 0.0–0.2)

## 2020-09-22 LAB — I-STAT BETA HCG BLOOD, ED (MC, WL, AP ONLY): I-stat hCG, quantitative: <5 m[IU]/mL (ref <5)

## 2020-09-22 LAB — LIPASE, BLOOD: Lipase: 41 U/L (ref 11–51)

## 2020-09-22 MED ORDER — FENTANYL CITRATE (PF) 100 MCG/2ML IJ SOLN
50.0000 ug | Freq: Once | INTRAMUSCULAR | Status: AC
  Administered 2020-09-22: 50 ug via INTRAVENOUS
  Filled 2020-09-22: qty 2

## 2020-09-22 MED ORDER — IOHEXOL 300 MG/ML  SOLN
100.0000 mL | Freq: Once | INTRAMUSCULAR | Status: AC | PRN
  Administered 2020-09-22: 100 mL via INTRAVENOUS

## 2020-09-22 MED ORDER — LIDOCAINE HCL (PF) 1 % IJ SOLN: 1.0000 mL | Freq: Once | INTRAMUSCULAR | Status: AC

## 2020-09-22 MED ORDER — CEFTRIAXONE SODIUM 500 MG IJ SOLR
500.0000 mg | Freq: Once | INTRAMUSCULAR | Status: AC
  Administered 2020-09-22: 500 mg via INTRAMUSCULAR
  Filled 2020-09-22: qty 500

## 2020-09-22 MED ORDER — LIDOCAINE HCL (PF) 1 % IJ SOLN
INTRAMUSCULAR | Status: AC
  Administered 2020-09-22: 1 mL
  Filled 2020-09-22: qty 5

## 2020-09-22 MED ORDER — DOXYCYCLINE HYCLATE 100 MG PO TABS
100.0000 mg | ORAL_TABLET | Freq: Once | ORAL | Status: AC
  Administered 2020-09-22: 100 mg via ORAL
  Filled 2020-09-22: qty 1

## 2020-09-22 NOTE — ED Notes (Signed)
Patient verbalized understanding of discharge instructions. Opportunity for questions and answers.  

## 2020-09-22 NOTE — ED Triage Notes (Signed)
Patient complains of lower abdominal pain x 1 week. Patient reports some intermittent nausea, vomiting. Patient alert and oriented, NAD

## 2020-09-22 NOTE — ED Notes (Signed)
Md aware of pts hypertension. Pt advised to taker her bp meds.

## 2020-09-22 NOTE — Discharge Instructions (Addendum)
Please take antibiotic as prescribed.  Take Tylenol Motrin as needed for pain control.  Please follow-up with your primary doctor and your gynecologist.  The CT showed likely uterine fibroids but the radiologist said you need to have a pelvic ultrasound performed to rule out underlying malignancy.  Your primary doctor or OBGYN can order this.  If you have worsening pain, vomiting, fever or other new concerning symptom, return to ER for reassessment.

## 2020-09-22 NOTE — ED Provider Notes (Addendum)
[facility] EMERGENCY DEPARTMENT Provider Note   CSN: [id number] Arrival date & time: 09/22/20  1613     History No chief complaint on file.   Krista [name] is a 60 y.o. female.  Presenting to ER with concern for lower abdominal pain.  Patient reports that pain has been ongoing for approximately 1 week, relatively constant, aching sensation.  Seems to radiate down both legs.  Some associated nausea but no vomiting.  Krista [name] at present.  No constipation or diarrhea.  Not currently sexually active but had been active in November, has noted clear discharge recently.  No dysuria or hematuria.  HPI     Past Medical History:  Diagnosis Date  . Arthritis   . CAD (coronary artery disease)   . Depression   . Diabetes mellitus   . Hypertension   . NSTEMI (non-ST elevated myocardial infarction) [facility])     Patient Active Problem List   Diagnosis Date Noted  . Statin myopathy 11/05/2019  . Dyspnea   . Knee pain   . Rhabdomyolysis due to statin therapy   . Demand ischemia ([place])   . Sepsis ([place]) 05/13/2019  . NSTEMI (non-ST elevated myocardial infarction) ([place]) 05/13/2019  . Coronary artery disease 02/12/2019  . Hyperlipidemia 02/12/2019  . Unstable angina ([place])   . Near syncope 12/14/2018  . Chest tightness 12/14/2018  . Prolonged Q-T interval on ECG   . Acute gastroenteritis 08/02/2012  . Dehydration 08/02/2012  . AKI (acute kidney injury) ([place]) 08/02/2012  . Hypokalemia 08/02/2012  . Diabetes mellitus ([place]) 08/02/2012  . HTN (hypertension) 08/02/2012  . Tobacco abuse 08/02/2012  . ALLERGIC RHINITIS WITH CONJUNCTIVITIS 10/21/2010  . ROTATOR CUFF SYNDROME, RIGHT 10/21/2010  . SEBACEOUS CYST, INFECTED 01/31/2010  . GUAIAC POSITIVE STOOL 10/05/2009  . TOBACCO ABUSE 09/21/2009  . RECTAL BLEEDING, HX OF 09/21/2009  . Nonspecific (abnormal) findings on radiological and other examination of body structure 05/19/2009  . RIGHT LOWER LUNG FIELD CRACKLES 05/19/2009   . Diabetes ([place]) 02/12/2009  . Pain in limb 02/12/2009  . DENTAL CARIES 04/21/2008  . HYPERTENSION 05/01/2007    Past Surgical History:  Procedure Laterality Date  . ANKLE SURGERY     left ankle - was broken  . CORONARY STENT INTERVENTION N/A 12/16/2018   Procedure: CORONARY STENT INTERVENTION;  Surgeon: Leonie Man, MD;  Location: [facility];  Service: Cardiovascular;  Laterality: N/A;  LAD  . LEFT HEART CATH AND CORONARY ANGIOGRAPHY N/A 12/16/2018   Procedure: LEFT HEART CATH AND CORONARY ANGIOGRAPHY;  Surgeon: Leonie Man, MD;  Location: [facility];  Service: Cardiovascular;  Laterality: N/A;     OB History   No obstetric history on file.     Family History  Problem Relation Age of Onset  . Diabetes Mother   . COPD Mother   . Hypertension Mother   . Diabetes Father   . Hypertension Father     Social History   Tobacco Use  . Smoking status: Current Every Day Smoker    Packs/day: 0.50    Years: 33.00    Pack years: 16.50    Types: Cigarettes  . Smokeless tobacco: Never Used  Substance Use Topics  . Alcohol use: No  . Drug use: No    Home Medications Prior to Admission medications   Medication Sig Start Date End Date Taking? Authorizing Provider  acetaminophen (TYLENOL) 500 MG tablet Take 1,000 mg by mouth every 6 (six) hours as needed for mild  pain or headache.    [provider]  aspirin EC 81 MG EC tablet Take 1 tablet (81 mg total) by mouth daily. 05/17/19   Myles Gip, DO  clopidogrel (PLAVIX) 75 MG tablet Take 1 tablet (75 mg total) by mouth daily. 09/05/19   Lorretta Harp, MD  diltiazem (DILACOR XR) 180 MG 24 hr capsule Take 2 capsules (360 mg total) by mouth daily. 04/09/20   Almyra Deforest, PA  ergocalciferol (VITAMIN D2) 1.25 MG (50000 UT) capsule Take 50,000 Units by mouth once a week.    [provider]  Evolocumab (REPATHA SURECLICK) 176 MG/ML SOAJ Inject 140 mg into the skin every 14 (fourteen) days.  11/10/19   Lorretta Harp, MD  glimepiride (AMARYL) 2 MG tablet Take 1 tablet (2 mg total) by mouth daily with breakfast. 09/28/19   Melynda Ripple, MD  loratadine (CLARITIN) 10 MG tablet Take 1 tablet (10 mg total) by mouth daily as needed for allergies. 10/28/19   Lorretta Harp, MD  metFORMIN (GLUCOPHAGE) 1000 MG tablet Take 1,000 mg by mouth 2 (two) times daily with a meal. 04/29/19   [provider]  nitroGLYCERIN (NITROSTAT) 0.4 MG SL tablet Place 1 tablet (0.4 mg total) under the tongue every 5 (five) minutes as needed for chest pain. 10/28/19   Lorretta Harp, MD    Allergies    Statins and Zetia [ezetimibe]  Review of Systems   Review of Systems  Constitutional: Negative for chills and fever.  HENT: Negative for ear pain and sore throat.   Eyes: Negative for pain and visual disturbance.  Respiratory: Negative for cough and shortness of breath.   Cardiovascular: Negative for chest pain and palpitations.  Gastrointestinal: Positive for abdominal pain and nausea. Negative for vomiting.  Genitourinary: Negative for dysuria and hematuria.  Musculoskeletal: Negative for arthralgias and back pain.  Skin: Negative for color change and rash.  Neurological: Negative for seizures and syncope.  All other systems reviewed and are negative.   Physical Exam Updated Vital Signs BP (!) 135/110   Pulse 86   Temp 99 F (37.2 C) (Oral)   Resp 14   SpO2 98%   Physical Exam Vitals and nursing note reviewed. Exam conducted with a chaperone present.  Constitutional:      General: She is not in acute distress.    Appearance: She is well-developed and well-nourished.  HENT:     Head: Normocephalic and atraumatic.  Eyes:     Conjunctiva/sclera: Conjunctivae normal.  Cardiovascular:     Rate and Rhythm: Normal rate and regular rhythm.     Heart sounds: No murmur heard.   Pulmonary:     Effort: Pulmonary effort is normal. No respiratory distress.     Breath sounds:  Normal breath sounds.  Abdominal:     Palpations: Abdomen is soft.     Tenderness: There is no guarding or rebound.     Comments: Tenderness to palpation in left lower quadrant and right lower quadrant  Genitourinary:    Comments: Small clear/yellow discharge noted, no adnexal mass or TTP noted, no CMT Musculoskeletal:        General: No deformity, signs of injury or edema.     Cervical back: Neck supple.  Skin:    General: Skin is warm and dry.     Capillary Refill: Capillary refill takes less than 2 seconds.  Neurological:     General: No focal deficit present.     Mental Status: She is  alert.  Psychiatric:        Mood and Affect: Mood and affect and mood normal.        Behavior: Behavior normal.        Thought Content: Thought content normal.     ED Results / Procedures / Treatments   Labs (all labs ordered are listed, but only abnormal results are displayed) Labs Reviewed  WET PREP, GENITAL - Abnormal; Notable for the following components:      Result Value   WBC, Wet Prep HPF POC MANY (*)    All other components within normal limits  COMPREHENSIVE METABOLIC PANEL - Abnormal; Notable for the following components:   Glucose, Bld 406 (*)    Total Protein 6.3 (*)    Albumin 2.9 (*)    AST 13 (*)    All other components within normal limits  URINALYSIS, ROUTINE W REFLEX MICROSCOPIC - Abnormal; Notable for the following components:   Specific Gravity, Urine <1.005 (*)    Glucose, UA >=500 (*)    All other components within normal limits  URINALYSIS, MICROSCOPIC (REFLEX) - Abnormal; Notable for the following components:   Bacteria, UA RARE (*)    All other components within normal limits  LIPASE, BLOOD  CBC  HIV ANTIBODY (ROUTINE TESTING W REFLEX)  RPR  I-STAT BETA HCG BLOOD, ED ([facility], [facility], [facility] ONLY)  GC/CHLAMYDIA PROBE AMP ([place]) NOT AT [facility]    EKG Krista [name]  Radiology CT ABDOMEN PELVIS W CONTRAST  Result Date: 09/22/2020 CLINICAL DATA:  Right lower quadrant  abdominal pain. Appendicitis suspected. EXAM: CT ABDOMEN AND PELVIS WITH CONTRAST TECHNIQUE: Multidetector CT imaging of the abdomen and pelvis was performed using the standard protocol following bolus administration of intravenous contrast. CONTRAST:  112mL OMNIPAQUE IOHEXOL 300 MG/ML  SOLN COMPARISON:  Ultrasound 05/14/2019 FINDINGS: Lower chest: Negative Hepatobiliary: Normal Pancreas: Normal Spleen: Normal Adrenals/Urinary Tract: Adrenal glands are normal. Kidneys are normal. Bladder is normal. Stomach/Bowel: Stomach and small intestine are normal. The appendix is normal. No visible colon pathology. Vascular/Lymphatic: Aortic atherosclerosis. No aneurysm. Considerable iliac artery atherosclerosis. Reproductive: Retroverted uterus. There is an abnormal appearance in the uterine fundus region which could either be a degenerated fibroid or an endometrial process. I would suggest pelvic ultrasound for further evaluation of this, which could be done as an outpatient unless the clinical impression is that of acute uterine pathology. Other: Abnormal edema extending anterior to the bladder, felt to be emanating from the symphysis pubis. Does the patient have clinical evidence of acute osteitis pubis? This can be due to infection or arthritis, notably CPPD. Musculoskeletal: Lower lumbar degenerative findings, most pronounced at L5-S1. IMPRESSION: 1. Normal appearing appendix. 2. Abnormal appearance of the uterine fundus region which could either be a degenerated fibroid or an endometrial process, including inflammatory or neoplastic disease. I would suggest pelvic ultrasound for further evaluation of this, which could be done as an outpatient unless the clinical impression is that of acute uterine pathology. 3. Abnormal edema extending anterior to the bladder, felt to be emanating from the symphysis pubis. Does the patient have clinical evidence of acute osteitis pubis? This can be due to infection or arthritis, notably  CPPD. Aortic Atherosclerosis (ICD10-I70.0). Electronically Signed   By: Nelson Chimes M.D.   On: 09/22/2020 21:53    Procedures Procedures   Medications Ordered in ED Medications  fentaNYL (SUBLIMAZE) injection 50 mcg (50 mcg Intravenous Given 09/22/20 2155)  iohexol (OMNIPAQUE) 300 MG/ML solution 100 mL (100 mLs Intravenous Contrast Given  09/22/20 2131)  cefTRIAXone (ROCEPHIN) injection 500 mg (500 mg Intramuscular Given 09/22/20 2258)  lidocaine (PF) (XYLOCAINE) 1 % injection 1 mL (1 mL Other Given 09/22/20 2302)  doxycycline (VIBRA-TABS) tablet 100 mg (100 mg Oral Given 09/22/20 2301)    ED Course  I have reviewed the triage vital signs and the nursing notes.  Pertinent labs & imaging results that were available during my care of the patient were reviewed by me and considered in my medical decision making (see chart for details).    MDM Rules/Calculators/A&P                         60 year old lady presenting to ER with concern for abdominal pain.  On exam patient well-appearing, noted to have some tenderness in her abdomen but no rebound or guarding, also noted small clear/yellow discharge on speculum exam.  Will treat empirically for GC chlamydia.  CT scan was obtained to further investigate, no acute abdominal pathology, radiologist commented on abnormal appearance of the uterine fundus likely fibroid but could not rule out neoplastic process.  Discussed this finding with patient and recommended obtaining the study as an outpatient through her primary or gynecology.  Patient did not have focal tenderness over symphysis pubis therefore less likely acute osteitis pubis.  On reassessment patient remained well-appearing, no ongoing pain.  Will discharge home.  Follow-up OB/GYN and primary.   After the discussed management above, the patient was determined to be safe for discharge.  The patient was in agreement with this plan and all questions regarding their care were answered.  ED return  precautions were discussed and the patient will return to the ED with any significant worsening of condition.  Final Clinical Impression(s) / ED Diagnoses Final diagnoses:  Vaginal discharge  Abdominal pain, unspecified abdominal location  Possible exposure to STD    Rx / DC Orders ED Discharge Orders    Krista [name]       Lucrezia Starch, MD 09/22/20 2351    Lucrezia Starch, MD 09/22/20 2352

## 2020-09-23 ENCOUNTER — Telehealth (HOSPITAL_COMMUNITY): Payer: Self-pay | Admitting: Emergency Medicine

## 2020-09-23 LAB — GC/CHLAMYDIA PROBE AMP (~~LOC~~) NOT AT ARMC
Chlamydia: NEGATIVE
Comment: NEGATIVE
Comment: NORMAL
Neisseria Gonorrhea: NEGATIVE

## 2020-09-23 LAB — RPR: RPR Ser Ql: NONREACTIVE

## 2020-09-23 LAB — HIV ANTIBODY (ROUTINE TESTING W REFLEX): HIV Screen 4th Generation wRfx: NONREACTIVE

## 2020-09-23 MED ORDER — DOXYCYCLINE HYCLATE 100 MG PO CAPS: 100.0000 mg | ORAL_CAPSULE | Freq: Two times a day (BID) | ORAL | 0 refills | Status: DC

## 2020-10-11 ENCOUNTER — Other Ambulatory Visit: Payer: Self-pay | Admitting: Family Medicine

## 2020-10-11 DIAGNOSIS — R1900 Intra-abdominal and pelvic swelling, mass and lump, unspecified site: Secondary | ICD-10-CM

## 2020-10-12 ENCOUNTER — Encounter: Payer: Self-pay | Admitting: Cardiovascular Disease

## 2020-10-12 ENCOUNTER — Ambulatory Visit (INDEPENDENT_AMBULATORY_CARE_PROVIDER_SITE_OTHER): Payer: Medicaid Other | Admitting: Cardiovascular Disease

## 2020-10-12 ENCOUNTER — Other Ambulatory Visit: Payer: Self-pay

## 2020-10-12 VITALS — BP 120/84 | HR 94 | Ht 63.0 in | Wt 154.0 lb

## 2020-10-12 DIAGNOSIS — Z720 Tobacco use: Secondary | ICD-10-CM

## 2020-10-12 DIAGNOSIS — E782 Mixed hyperlipidemia: Secondary | ICD-10-CM

## 2020-10-12 DIAGNOSIS — I2510 Atherosclerotic heart disease of native coronary artery without angina pectoris: Secondary | ICD-10-CM | POA: Diagnosis not present

## 2020-10-12 DIAGNOSIS — I10 Essential (primary) hypertension: Secondary | ICD-10-CM | POA: Diagnosis not present

## 2020-10-12 NOTE — Assessment & Plan Note (Signed)
History of hyperlipidemia on Repatha therapy with lipid profile performed 09/22/2019 revealing total cholesterol of 170, LDL 113 HDL 41.  We will recheck a lipid liver profile.

## 2020-10-12 NOTE — Patient Instructions (Signed)
Medication Instructions:  Your physician recommends that you continue on your current medications as directed. Please refer to the Current Medication list given to you today.  *If you need a refill on your cardiac medications before your next appointment, please call your pharmacy*   Lab Work: Your physician recommends that you return for lab work in: 1-2 weeks for lipid/liver profile  If you have labs (blood work) drawn today and your tests are completely normal, you will receive your results only by: [name] MyChart Message (if you have MyChart) OR . A paper copy in the mail If you have any lab test that is abnormal or we need to change your treatment, we will call you to review the results.   Follow-Up: At [facility], you and your health needs are our priority.  As part of our continuing mission to provide you with exceptional heart care, we have created designated Provider Care Teams.  These Care Teams include your primary Cardiologist (physician) and Advanced Practice Providers (APPs -  Physician Assistants and Nurse Practitioners) who all work together to provide you with the care you need, when you need it.  We recommend signing up for the patient portal called "MyChart".  Sign up information is provided on this After Visit Summary.  MyChart is used to connect with patients for Virtual Visits (Telemedicine).  Patients are able to view lab/test results, encounter notes, upcoming appointments, etc.  Non-urgent messages can be sent to your provider as well.   To learn more about what you can do with MyChart, go to [email or web address].    Your next appointment:   12 month(s)  The format for your next appointment:   In Person  Provider:   Quay Burow, MD

## 2020-10-12 NOTE — Progress Notes (Signed)
10/12/2020 [name]   [birth year]  [id number]  Primary Physician Lang Snow, FNP Primary Cardiologist: Lorretta Harp MD [name], [place]  HPI:  Krista [name] is a 60 y.o.  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   I last saw her in the office 09/05/2019.[name] Her risk factors include tobacco abuse, treated hypertension, diabetes and hyperlipidemia. She presented on 12/13/2018 with a non-STEMI and underwent cardiac catheterization 3 days later by Dr. Ellyn Hack revealing high-grade proximal LAD disease which was stented with a synergy drug-eluting stent. She did have high-grade diffuse diagonal branch disease, ramus branch and RCA disease all of which were treated medically. She was discharged home the following day on dual antiplatelet therapy including aspirin and Brilinta and has remained pain-free since that time although she does continue to smoke 1 pack/week.  She was admitted in September with sepsis and had rhabdomyolysis.  Enzymes were mildly elevated as well and she was seen by Dr. Burt Knack who thought this was related to "demand ischemia".  In ischemic evaluation was not pursued.  She is on Plavix and aspirin currently.  She denies chest pain or shortness of breath.  Because of rhabdomyolysis it was recommended that she switch from statin therapy which she was on (pravastatin) to a PCSK9.  Since I saw her a year ago she continues to do well.  She is completely asymptomatic.  She does however continue to smoke 1/2 pack/day.   Current Meds  Medication Sig  . acetaminophen (TYLENOL) 500 MG tablet Take 1,000 mg by mouth every 6 (six) hours as needed for mild pain or headache.  [name] aspirin EC 81 MG EC tablet Take 1 tablet (81 mg total) by mouth daily.  . clopidogrel (PLAVIX) 75 MG tablet Take 1 tablet (75 mg total) by mouth daily.  [name] diltiazem (DILACOR XR) 180 MG 24 hr  capsule Take 2 capsules (360 mg total) by mouth daily.  [name] doxycycline (VIBRAMYCIN) 100 MG capsule Take 1 capsule (100 mg total) by mouth 2 (two) times daily. One po bid x 7 days  . doxycycline (VIBRAMYCIN) 100 MG capsule Take 1 capsule (100 mg total) by mouth 2 (two) times daily. One po bid x 7 days  . ergocalciferol (VITAMIN D2) 1.25 MG (50000 UT) capsule Take 50,000 Units by mouth once a week.  . Evolocumab (REPATHA SURECLICK) 350 MG/ML SOAJ Inject 140 mg into the skin every 14 (fourteen) days.  [name] loratadine (CLARITIN) 10 MG tablet Take 1 tablet (10 mg total) by mouth daily as needed for allergies.  . metFORMIN (GLUCOPHAGE) 1000 MG tablet Take 1,000 mg by mouth 2 (two) times daily with a meal.  . nitroGLYCERIN (NITROSTAT) 0.4 MG SL tablet Place 1 tablet (0.4 mg total) under the tongue every 5 (five) minutes as needed for chest pain.  . [DISCONTINUED] glimepiride (AMARYL) 2 MG tablet Take 1 tablet (2 mg total) by mouth daily with breakfast.     Allergies  Allergen Reactions  . Statins     itching  . Zetia [Ezetimibe] Itching    Social History   Socioeconomic History  . Marital status: Single    Spouse name: Not on file  . Number of children: Not on file  . Years of education: Not on file  . Highest education level: Not on file  Occupational History  . Not on file  Tobacco Use  . Smoking status: Current  Every Day Smoker    Packs/day: 0.50    Years: 33.00    Pack years: 16.50    Types: Cigarettes  . Smokeless tobacco: Never Used  Substance and Sexual Activity  . Alcohol use: No  . Drug use: No  . Sexual activity: Yes    Birth control/protection: Post-menopausal  Other Topics Concern  . Not on file  Social History Narrative  . Not on file   Social Determinants of Health   Financial Resource Strain: Not on file  Food Insecurity: Not on file  Transportation Needs: Not on file  Physical Activity: Not on file  Stress: Not on file  Social Connections: Not on file   Intimate Partner Violence: Not on file     Review of Systems: General: negative for chills, fever, night sweats or weight changes.  Cardiovascular: negative for chest pain, dyspnea on exertion, edema, orthopnea, palpitations, paroxysmal nocturnal dyspnea or shortness of breath Dermatological: negative for rash Respiratory: negative for cough or wheezing Urologic: negative for hematuria Abdominal: negative for nausea, vomiting, diarrhea, bright red blood per rectum, melena, or hematemesis Neurologic: negative for visual changes, syncope, or dizziness All other systems reviewed and are otherwise negative except as noted above.    Blood pressure 120/84, pulse 94, height 5\' 3"  (1.6 m), weight 154 lb (69.9 kg), SpO2 97 %.  General appearance: alert and no distress Neck: no adenopathy, no carotid bruit, no JVD, supple, symmetrical, trachea midline and thyroid not enlarged, symmetric, no tenderness/mass/nodules Lungs: clear to auscultation bilaterally Heart: regular rate and rhythm, S1, S2 normal, no murmur, click, rub or gallop Extremities: extremities normal, atraumatic, no cyanosis or edema Pulses: 2+ and symmetric Skin: Skin color, texture, turgor normal. No rashes or lesions Neurologic: Alert and oriented X 3, normal strength and tone. Normal symmetric reflexes. Normal coordination and gait  EKG sinus rhythm at 94 with incomplete right bundle branch block and left anterior fascicular block.  I personally reviewed this EKG.  ASSESSMENT AND PLAN:   HTN (hypertension) History of essential hypertension a blood pressure measured today at 120/84.  She is on diltiazem.  Tobacco abuse History of ongoing tobacco abuse of 1/2 pack/day recalcitrant to risk factor modification.  Coronary artery disease History of CAD status post non-STEMI cardiac catheterization performed by Dr. Ellyn Hack revealing high-grade proximal LAD disease which is stented with a synergy drug-eluting stent.  This was  done 12/16/2018.  She did have high-grade disease in diagonal branch, ramus branch and diffuse disease in the RCA all of which were treated medically.  She is on dual antiplatelet therapy.  She denies chest pain or shortness of breath.  Hyperlipidemia History of hyperlipidemia on Repatha therapy with lipid profile performed 09/22/2019 revealing total cholesterol of 170, LDL 113 HDL 41.  We will recheck a lipid liver profile.      Lorretta Harp MD [place], [facility] 10/12/2020 2:10 PM

## 2020-10-12 NOTE — Assessment & Plan Note (Signed)
History of ongoing tobacco abuse of 1/2 pack/day recalcitrant to risk factor modification. 

## 2020-10-12 NOTE — Assessment & Plan Note (Signed)
History of CAD status post non-STEMI cardiac catheterization performed by Dr. Ellyn Hack revealing high-grade proximal LAD disease which is stented with a synergy drug-eluting stent.  This was done 12/16/2018.  She did have high-grade disease in diagonal branch, ramus branch and diffuse disease in the RCA all of which were treated medically.  She is on dual antiplatelet therapy.  She denies chest pain or shortness of breath.

## 2020-10-12 NOTE — Assessment & Plan Note (Signed)
History of essential hypertension a blood pressure measured today at 120/84.  She is on diltiazem.

## 2020-10-19 ENCOUNTER — Ambulatory Visit
Admission: RE | Admit: 2020-10-19 | Discharge: 2020-10-19 | Disposition: A | Discharge status: Home / Self Care | Payer: Medicaid Other | Source: Ambulatory Visit | Attending: Family Medicine | Admitting: Family Medicine

## 2020-10-19 DIAGNOSIS — R1900 Intra-abdominal and pelvic swelling, mass and lump, unspecified site: Secondary | ICD-10-CM

## 2020-11-10 ENCOUNTER — Encounter: Payer: Self-pay | Admitting: *Deleted

## 2020-11-24 ENCOUNTER — Other Ambulatory Visit: Payer: Self-pay | Admitting: Family Medicine

## 2020-11-24 DIAGNOSIS — Z1231 Encounter for screening mammogram for malignant neoplasm of breast: Secondary | ICD-10-CM

## 2020-11-25 ENCOUNTER — Ambulatory Visit: Payer: Medicaid Other

## 2020-12-07 ENCOUNTER — Telehealth: Payer: Self-pay | Admitting: Cardiovascular Disease

## 2020-12-07 NOTE — Telephone Encounter (Signed)
   [facility] Pre-operative Risk Assessment    Patient Name: Krista [name]  DOB: [birth year]  MRN: [id number]   HEARTCARE STAFF: - Please ensure there is not already an duplicate clearance open for this procedure. - Under Visit Info/Reason for Call, type in Other and utilize the format Clearance MM/DD/YY or Clearance TBD. Do not use dashes or single digits. - If request is for dental extraction, please clarify the # of teeth to be extracted.  Request for surgical clearance:  1. What type of surgery is being performed? Hysterectomy  2. When is this surgery scheduled? 12/17/2020   3. What type of clearance is required (medical clearance vs. Pharmacy clearance to hold med vs. Both)? Pharmacy Clearance   4. Are there any medications that need to be held prior to surgery and how long? clopidogrel (PLAVIX) 75 MG tablet [[id number]]  aspirin EC 81 MG EC tablet [[id number]]   5. Practice name and name of physician performing surgery? [facility]  6. What is the office phone number? [phone]   7.   What is the office fax number? [phone]  8.   Anesthesia type (None, local, MAC, general) ? General    Shana A Stovall 12/07/2020, 2:20 PM  _________________________________________________________________   (provider comments below)

## 2020-12-08 NOTE — Telephone Encounter (Signed)
Given the close proximity of her upcoming surgery, I attempted to call the patient again, still unable to reach the patient to

## 2020-12-08 NOTE — Telephone Encounter (Signed)
Left message for the patient to call back and speak to the on-call preop APP of the day.  Note, patient's last PCI was in 2020.  She was recently seen by Dr. Gwenlyn Found in March 2022 at which time she was stable from cardiac perspective.  Dr. Gwenlyn Found, please comment on whether the patient may hold aspirin and Plavix for 5 to 7 days prior to hysterectomy as long as she is asymptomatic without anginal symptoms?  Please forward your response to P CV DIV PREOP

## 2020-12-08 NOTE — Telephone Encounter (Signed)
Okay to hold antiplatelet agents for GYN procedure.

## 2020-12-09 NOTE — Telephone Encounter (Signed)
Addendum: system went down while entering in notes. Pt has been advised to follow up with surgeon's office to confirm exactly how many days 5-7 days. Pt aware will resume Plavix and ASA once surgeon feels safe to resume. I will forward clearance notes to surgeon's office.

## 2020-12-09 NOTE — Telephone Encounter (Signed)
   Primary Cardiologist: Quay Burow, MD  Chart reviewed as part of pre-operative protocol coverage. Given past medical history and time since last visit, based on ACC/AHA guidelines, Krista [name] would be at acceptable risk for the planned procedure without further cardiovascular testing.   Her clopidogrel and aspirin may be held for 5 to 7 days prior to procedure.  Please resume as soon as hemostasis is achieved at the discretion of the surgeon.    I will route this recommendation to the requesting party via Epic fax function and remove from pre-op pool.  Please call with questions.  [name]. [name] NP-C    12/09/2020, 11:50 AM [place] [address] Office [phone] Fax [phone]

## 2020-12-09 NOTE — Telephone Encounter (Signed)
Pt has been notified ok to hold Plavix and ASA 5-7 days prior to procedure. Pt has been advised t

## 2020-12-09 NOTE — Telephone Encounter (Signed)
Patient is returning call.  °

## 2020-12-09 NOTE — Telephone Encounter (Signed)
Routed to provider covering pre-op and pre-op callback team

## 2020-12-09 NOTE — Telephone Encounter (Signed)
Patient was cleared and may hold her Plavix 5 to 7 days prior to her procedure.  Please return call and let her know.  Thank you.

## 2020-12-13 ENCOUNTER — Ambulatory Visit (INDEPENDENT_AMBULATORY_CARE_PROVIDER_SITE_OTHER): Payer: Medicare Other | Admitting: Obstetrics & Gynecology

## 2020-12-13 ENCOUNTER — Other Ambulatory Visit: Payer: Self-pay

## 2020-12-13 DIAGNOSIS — C541 Malignant neoplasm of endometrium: Secondary | ICD-10-CM | POA: Diagnosis not present

## 2020-12-13 NOTE — Patient Instructions (Signed)
Total Laparoscopic Hysterectomy, Care After The following information offers guidance on how to care for yourself after your procedure. Your health care provider may also give you more specific instructions. If you have problems or questions, contact your health care provider. What can I expect after the procedure? After the procedure, it is common to have:  Pain, bruising, and numbness around your incisions.  Tiredness (fatigue).  Poor appetite.  Less interest in sex.  Vaginal discharge or bleeding. You will need to use a sanitary pad after this procedure.  Feelings of sadness or other emotions. If your ovaries were also removed, it is also common to have symptoms of menopause, such as hot flashes, night sweats, and lack of sleep (insomnia). Follow these instructions at home: Medicines  Take over-the-counter and prescription medicines only as told by your health care provider.  Ask your health care provider if the medicine prescribed to you: ? Requires you to avoid driving or using machinery. ? Can cause constipation. You may need to take these actions to prevent or treat constipation:  Drink enough fluid to keep your urine pale yellow.  Take over-the-counter or prescription medicines.  Eat foods that are high in fiber, such as beans, whole grains, and fresh fruits and vegetables.  Limit foods that are high in fat and processed sugars, such as fried or sweet foods. Incision care  Follow instructions from your health care provider about how to take care of your incisions. Make sure you: ? Wash your hands with soap and water for at least 20 seconds before and after you change your bandage (dressing). If soap and water are not available, use hand sanitizer. ? Change your dressing as told by your health care provider. ? Leave stitches (sutures), skin glue, or adhesive strips in place. These skin closures may need to stay in place for 2 weeks or longer. If adhesive strip edges start  to loosen and curl up, you may trim the loose edges. Do not remove adhesive strips completely unless your health care provider tells you to do that.  Check your incision areas every day for signs of infection. Check for: ? More redness, swelling, or pain. ? Fluid or blood. ? Warmth. ? Pus or a bad smell.   Activity  Rest as told by your health care provider.  Avoid sitting for a long time without moving. Get up to take short walks every 1-2 hours. This is important to improve blood flow and breathing. Ask for help if you feel weak or unsteady.  Return to your normal activities as told by your health care provider. Ask your health care provider what activities are safe for you.  Do not lift anything that is heavier than 10 lb (4.5 kg), or the limit that you are told, for one month after surgery or until your health care provider says that it is safe.  If you were given a sedative during the procedure, it can affect you for several hours. Do not drive or operate machinery until your health care provider says that it is safe.   Lifestyle  Do not use any products that contain nicotine or tobacco. These products include cigarettes, chewing tobacco, and vaping devices, such as e-cigarettes. These can delay healing after surgery. If you need help quitting, ask your health care provider.  Do not drink alcohol until your health care provider approves. General instructions  Do not douche, use tampons, or have sex for at least 6 weeks, or as told by your health   care provider.  If you struggle with physical or emotional changes after your procedure, speak with your health care provider or a therapist.  Do not take baths, swim, or use a hot tub until your health care provider approves. You may only be allowed to take showers for 2-3 weeks.  Keep your dressing dry until your health care provider says it can be removed.  Try to have someone at home with you for the first 1-2 weeks to help with your  daily chores.  Wear compression stockings as told by your health care provider. These stockings help to prevent blood clots and reduce swelling in your legs.  Keep all follow-up visits. This is important.   Contact a health care provider if:  You have any of these signs of infection: ? Chills or a fever. ? More redness, swelling, or pain around an incision. ? Fluid or blood coming from an incision. ? Warmth coming from an incision. ? Pus or a bad smell coming from an incision.  An incision opens.  You feel dizzy or light-headed.  You have pain or bleeding when you urinate, or you are unable to urinate.  You have abnormal vaginal discharge.  You have pain that does not get better with medicine. Get help right away if:  You have a fever and your symptoms suddenly get worse.  You have severe abdominal pain.  You have chest pain or shortness of breath.  You faint.  You have pain, swelling, or redness in your leg.  You have heavy vaginal bleeding with blood clots, soaking through a sanitary pad in less than 1 hour. These symptoms may represent a serious problem that is an emergency. Do not wait to see if the symptoms will go away. Get medical help right away. Call your local emergency services (911 in the [place] Do not drive yourself to the hospital. Summary  After the procedure, it is common to have pain and bruising around your incisions.  Do not take baths, swim, or use a hot tub until your health care provider approves.  Do not lift anything that is heavier than 10 lb (4.5 kg), or the limit that you are told, for one month after surgery or until your health care provider says that it is safe.  Tell your health care provider if you have any signs or symptoms of infection after the procedure.  Get help right away if you have severe abdominal pain, chest pain, shortness of breath, or heavy bleeding from your vagina. This information is not intended to replace advice given  to you by your health care provider. Make sure you discuss any questions you have with your health care provider. Document Revised: 04/02/2020 Document Reviewed: 04/02/2020 Elsevier Patient Education  2021 [employer]  

## 2020-12-13 NOTE — Progress Notes (Signed)
While checking patient in chart reviewed by Dr. Roselie Awkward and he came  in to speak with patient and nurse and patient family ( Mother). Discussed she has already been seen by another gynecologist and gyncecology oncologist who is doing surgery this Friday for her issues. Does not need to be seen in our office today. Patient and her mother agree with plan. Candon Caras,RN

## 2020-12-13 NOTE — Progress Notes (Signed)
Patient ID: Krista [name], female   DOB: 10/09/1960, 60 y.o.   MRN: [id number]  Chief Complaint  Patient presents with  . Pelvic mass    HPI Krista [name] is a 60 y.o. female.  No obstetric history on file. Patient of Dr Lenna Sciara. Freddrick March and she is scheduled for [facility] in 3 days at [facility] for endometrial cancer. This appointment was made before her diagnosis was made and she has appropriate f/u already HPI  Past Medical History:  Diagnosis Date  . Arthritis   . CAD (coronary artery disease)   . Depression   . Diabetes mellitus   . Hypertension   . NSTEMI (non-ST elevated myocardial infarction) [facility])     Past Surgical History:  Procedure Laterality Date  . ANKLE SURGERY     left ankle - was broken  . CORONARY STENT INTERVENTION N/A 12/16/2018   Procedure: CORONARY STENT INTERVENTION;  Surgeon: Leonie Man, MD;  Location: [place] [facility];  Service: Cardiovascular;  Laterality: N/A;  LAD  . LEFT HEART CATH AND CORONARY ANGIOGRAPHY N/A 12/16/2018   Procedure: LEFT HEART CATH AND CORONARY ANGIOGRAPHY;  Surgeon: Leonie Man, MD;  Location: [place] [facility];  Service: Cardiovascular;  Laterality: N/A;    Family History  Problem Relation Age of Onset  . Diabetes Mother   . COPD Mother   . Hypertension Mother   . Diabetes Father   . Hypertension Father     Social History Social History   Tobacco Use  . Smoking status: Current Every Day Smoker    Packs/day: 0.50    Years: 33.00    Pack years: 16.50    Types: Cigarettes  . Smokeless tobacco: Never Used  Substance Use Topics  . Alcohol use: No  . Drug use: No    Allergies  Allergen Reactions  . Statins     itching  . Zetia [Ezetimibe] Itching    Current Outpatient Medications  Medication Sig Dispense Refill  . acetaminophen (TYLENOL) 500 MG tablet Take 1,000 mg by mouth every 6 (six) hours as needed for mild pain or headache.    [name] aspirin EC 81 MG EC tablet Take 1 tablet (81 mg total) by mouth  daily. 30 tablet 0  . clopidogrel (PLAVIX) 75 MG tablet Take 1 tablet (75 mg total) by mouth daily. 90 tablet 3  . diltiazem (DILACOR XR) 180 MG 24 hr capsule Take 2 capsules (360 mg total) by mouth daily. 180 capsule 3  . doxycycline (VIBRAMYCIN) 100 MG capsule Take 1 capsule (100 mg total) by mouth 2 (two) times daily. One po bid x 7 days 14 capsule 0  . Evolocumab (REPATHA SURECLICK) 798 MG/ML SOAJ Inject 140 mg into the skin every 14 (fourteen) days. 2 pen 11  . loratadine (CLARITIN) 10 MG tablet Take 1 tablet (10 mg total) by mouth daily as needed for allergies. 30 tablet 11  . metFORMIN (GLUCOPHAGE) 1000 MG tablet Take 1,000 mg by mouth 2 (two) times daily with a meal.    . nitroGLYCERIN (NITROSTAT) 0.4 MG SL tablet Place 1 tablet (0.4 mg total) under the tongue every 5 (five) minutes as needed for chest pain. 30 tablet 2  . TRULICITY 9.21 JH/4.1DE SOPN Inject into the skin.    [name] ACCU-CHEK GUIDE test strip     . doxycycline (VIBRAMYCIN) 100 MG capsule Take 1 capsule (100 mg total) by mouth 2 (two) times daily. One po bid x 7 days 14 capsule 0  .  ergocalciferol (VITAMIN D2) 1.25 MG (50000 UT) capsule Take 50,000 Units by mouth once a week.     No current facility-administered medications for this visit.    Review of Systems Review of Systems  Genitourinary: Positive for pelvic pain and vaginal bleeding.    There were no vitals taken for this visit.  Physical Exam Physical Exam Constitutional:      Appearance: Normal appearance.  Neurological:     Mental Status: She is alert.  Psychiatric:        Mood and Affect: Mood normal.        Behavior: Behavior normal.     Data Reviewed Endometrial biopsy done 2 weeks ago   Assessment Endometrial cancer [facility])    Plan F/u for planned surgery as already established not need to transfer care at this time    Krista [name] 12/13/2020, 1:57 PM

## 2021-01-13 ENCOUNTER — Ambulatory Visit
Admission: RE | Admit: 2021-01-13 | Discharge: 2021-01-13 | Disposition: A | Discharge status: Home / Self Care | Payer: Medicare Other | Source: Ambulatory Visit | Attending: Family Medicine | Admitting: Family Medicine

## 2021-01-13 ENCOUNTER — Other Ambulatory Visit: Payer: Self-pay

## 2021-01-13 DIAGNOSIS — Z1231 Encounter for screening mammogram for malignant neoplasm of breast: Secondary | ICD-10-CM

## 2021-01-14 ENCOUNTER — Ambulatory Visit: Payer: Medicaid Other

## 2021-01-19 ENCOUNTER — Other Ambulatory Visit: Payer: Self-pay | Admitting: Family Medicine

## 2021-01-19 DIAGNOSIS — R928 Other abnormal and inconclusive findings on diagnostic imaging of breast: Secondary | ICD-10-CM

## 2021-02-10 ENCOUNTER — Ambulatory Visit
Admission: RE | Admit: 2021-02-10 | Discharge: 2021-02-10 | Disposition: A | Discharge status: Home / Self Care | Payer: Medicare Other | Source: Ambulatory Visit | Attending: Family Medicine | Admitting: Family Medicine

## 2021-02-10 ENCOUNTER — Other Ambulatory Visit: Payer: Self-pay

## 2021-02-10 ENCOUNTER — Other Ambulatory Visit: Payer: Self-pay | Admitting: Family Medicine

## 2021-02-10 DIAGNOSIS — R928 Other abnormal and inconclusive findings on diagnostic imaging of breast: Secondary | ICD-10-CM

## 2021-02-22 ENCOUNTER — Ambulatory Visit
Admission: RE | Admit: 2021-02-22 | Discharge: 2021-02-22 | Disposition: A | Discharge status: Home / Self Care | Payer: Medicare Other | Source: Ambulatory Visit | Attending: Family Medicine | Admitting: Family Medicine

## 2021-02-22 ENCOUNTER — Other Ambulatory Visit: Payer: Self-pay

## 2021-02-22 DIAGNOSIS — R928 Other abnormal and inconclusive findings on diagnostic imaging of breast: Secondary | ICD-10-CM

## 2021-02-24 ENCOUNTER — Other Ambulatory Visit: Payer: Self-pay | Admitting: Family Medicine

## 2021-02-24 DIAGNOSIS — N6489 Other specified disorders of breast: Secondary | ICD-10-CM

## 2022-01-27 ENCOUNTER — Inpatient Hospital Stay (HOSPITAL_COMMUNITY)
Admission: EM | Admit: 2022-01-27 | Discharge: 2022-02-01 | DRG: 940 | Disposition: A | Discharge status: Home / Self Care | Payer: Medicare Other | Attending: Internal Medicine | Admitting: Internal Medicine

## 2022-01-27 ENCOUNTER — Emergency Department (HOSPITAL_COMMUNITY): Payer: Medicare Other

## 2022-01-27 ENCOUNTER — Encounter (HOSPITAL_COMMUNITY): Payer: Self-pay | Admitting: Emergency Medicine

## 2022-01-27 ENCOUNTER — Other Ambulatory Visit: Payer: Self-pay

## 2022-01-27 DIAGNOSIS — R778 Other specified abnormalities of plasma proteins: Secondary | ICD-10-CM

## 2022-01-27 DIAGNOSIS — Z7982 Long term (current) use of aspirin: Secondary | ICD-10-CM

## 2022-01-27 DIAGNOSIS — E1140 Type 2 diabetes mellitus with diabetic neuropathy, unspecified: Secondary | ICD-10-CM | POA: Diagnosis present

## 2022-01-27 DIAGNOSIS — I252 Old myocardial infarction: Secondary | ICD-10-CM

## 2022-01-27 DIAGNOSIS — Z833 Family history of diabetes mellitus: Secondary | ICD-10-CM

## 2022-01-27 DIAGNOSIS — I129 Hypertensive chronic kidney disease with stage 1 through stage 4 chronic kidney disease, or unspecified chronic kidney disease: Secondary | ICD-10-CM | POA: Diagnosis present

## 2022-01-27 DIAGNOSIS — S2242XA Multiple fractures of ribs, left side, initial encounter for closed fracture: Principal | ICD-10-CM

## 2022-01-27 DIAGNOSIS — F17200 Nicotine dependence, unspecified, uncomplicated: Secondary | ICD-10-CM | POA: Diagnosis present

## 2022-01-27 DIAGNOSIS — I10 Essential (primary) hypertension: Secondary | ICD-10-CM

## 2022-01-27 DIAGNOSIS — E1142 Type 2 diabetes mellitus with diabetic polyneuropathy: Secondary | ICD-10-CM | POA: Diagnosis present

## 2022-01-27 DIAGNOSIS — R52 Pain, unspecified: Secondary | ICD-10-CM | POA: Diagnosis not present

## 2022-01-27 DIAGNOSIS — E119 Type 2 diabetes mellitus without complications: Secondary | ICD-10-CM

## 2022-01-27 DIAGNOSIS — C787 Secondary malignant neoplasm of liver and intrahepatic bile duct: Secondary | ICD-10-CM | POA: Diagnosis present

## 2022-01-27 DIAGNOSIS — J439 Emphysema, unspecified: Secondary | ICD-10-CM | POA: Diagnosis present

## 2022-01-27 DIAGNOSIS — Z6826 Body mass index (BMI) 26.0-26.9, adult: Secondary | ICD-10-CM

## 2022-01-27 DIAGNOSIS — F17210 Nicotine dependence, cigarettes, uncomplicated: Secondary | ICD-10-CM | POA: Diagnosis present

## 2022-01-27 DIAGNOSIS — E440 Moderate protein-calorie malnutrition: Secondary | ICD-10-CM

## 2022-01-27 DIAGNOSIS — N131 Hydronephrosis with ureteral stricture, not elsewhere classified: Secondary | ICD-10-CM | POA: Diagnosis present

## 2022-01-27 DIAGNOSIS — G893 Neoplasm related pain (acute) (chronic): Principal | ICD-10-CM

## 2022-01-27 DIAGNOSIS — Z7984 Long term (current) use of oral hypoglycemic drugs: Secondary | ICD-10-CM

## 2022-01-27 DIAGNOSIS — Z8249 Family history of ischemic heart disease and other diseases of the circulatory system: Secondary | ICD-10-CM

## 2022-01-27 DIAGNOSIS — Z66 Do not resuscitate: Secondary | ICD-10-CM | POA: Diagnosis not present

## 2022-01-27 DIAGNOSIS — Z91199 Patient's noncompliance with other medical treatment and regimen due to unspecified reason: Secondary | ICD-10-CM

## 2022-01-27 DIAGNOSIS — S2249XA Multiple fractures of ribs, unspecified side, initial encounter for closed fracture: Secondary | ICD-10-CM

## 2022-01-27 DIAGNOSIS — Z7189 Other specified counseling: Secondary | ICD-10-CM

## 2022-01-27 DIAGNOSIS — N179 Acute kidney failure, unspecified: Secondary | ICD-10-CM | POA: Diagnosis present

## 2022-01-27 DIAGNOSIS — R9431 Abnormal electrocardiogram [ECG] [EKG]: Secondary | ICD-10-CM

## 2022-01-27 DIAGNOSIS — E785 Hyperlipidemia, unspecified: Secondary | ICD-10-CM | POA: Diagnosis present

## 2022-01-27 DIAGNOSIS — Z79899 Other long term (current) drug therapy: Secondary | ICD-10-CM

## 2022-01-27 DIAGNOSIS — Z825 Family history of asthma and other chronic lower respiratory diseases: Secondary | ICD-10-CM

## 2022-01-27 DIAGNOSIS — W1830XA Fall on same level, unspecified, initial encounter: Secondary | ICD-10-CM | POA: Diagnosis present

## 2022-01-27 DIAGNOSIS — I2510 Atherosclerotic heart disease of native coronary artery without angina pectoris: Secondary | ICD-10-CM | POA: Diagnosis present

## 2022-01-27 DIAGNOSIS — C541 Malignant neoplasm of endometrium: Secondary | ICD-10-CM

## 2022-01-27 DIAGNOSIS — Z1501 Genetic susceptibility to malignant neoplasm of breast: Secondary | ICD-10-CM

## 2022-01-27 DIAGNOSIS — R059 Cough, unspecified: Secondary | ICD-10-CM

## 2022-01-27 DIAGNOSIS — R042 Hemoptysis: Secondary | ICD-10-CM

## 2022-01-27 DIAGNOSIS — Z7985 Long-term (current) use of injectable non-insulin antidiabetic drugs: Secondary | ICD-10-CM

## 2022-01-27 DIAGNOSIS — M1990 Unspecified osteoarthritis, unspecified site: Secondary | ICD-10-CM | POA: Diagnosis present

## 2022-01-27 DIAGNOSIS — Z7902 Long term (current) use of antithrombotics/antiplatelets: Secondary | ICD-10-CM

## 2022-01-27 DIAGNOSIS — C7989 Secondary malignant neoplasm of other specified sites: Secondary | ICD-10-CM | POA: Diagnosis present

## 2022-01-27 DIAGNOSIS — E1165 Type 2 diabetes mellitus with hyperglycemia: Secondary | ICD-10-CM | POA: Diagnosis present

## 2022-01-27 DIAGNOSIS — R7989 Other specified abnormal findings of blood chemistry: Secondary | ICD-10-CM

## 2022-01-27 DIAGNOSIS — Z955 Presence of coronary angioplasty implant and graft: Secondary | ICD-10-CM

## 2022-01-27 DIAGNOSIS — N182 Chronic kidney disease, stage 2 (mild): Secondary | ICD-10-CM | POA: Diagnosis present

## 2022-01-27 DIAGNOSIS — I7121 Aneurysm of the ascending aorta, without rupture: Secondary | ICD-10-CM | POA: Diagnosis present

## 2022-01-27 DIAGNOSIS — E1122 Type 2 diabetes mellitus with diabetic chronic kidney disease: Secondary | ICD-10-CM | POA: Diagnosis present

## 2022-01-27 DIAGNOSIS — C7802 Secondary malignant neoplasm of left lung: Secondary | ICD-10-CM | POA: Diagnosis present

## 2022-01-27 DIAGNOSIS — Z888 Allergy status to other drugs, medicaments and biological substances status: Secondary | ICD-10-CM

## 2022-01-27 DIAGNOSIS — I700 Atherosclerosis of aorta: Secondary | ICD-10-CM | POA: Diagnosis present

## 2022-01-27 DIAGNOSIS — C799 Secondary malignant neoplasm of unspecified site: Secondary | ICD-10-CM

## 2022-01-27 DIAGNOSIS — N1330 Unspecified hydronephrosis: Secondary | ICD-10-CM

## 2022-01-27 DIAGNOSIS — M8458XA Pathological fracture in neoplastic disease, other specified site, initial encounter for fracture: Secondary | ICD-10-CM | POA: Diagnosis present

## 2022-01-27 DIAGNOSIS — C7801 Secondary malignant neoplasm of right lung: Secondary | ICD-10-CM | POA: Diagnosis present

## 2022-01-27 LAB — COMPREHENSIVE METABOLIC PANEL
ALT: 12 U/L (ref 0–44)
AST: 15 U/L (ref 15–41)
Albumin: 3 g/dL — ABNORMAL LOW (ref 3.5–5.0)
Alkaline Phosphatase: 56 U/L (ref 38–126)
Anion gap: 9 (ref 5–15)
BUN: 14 mg/dL (ref 6–20)
CO2: 24 mmol/L (ref 22–32)
Calcium: 8.9 mg/dL (ref 8.9–10.3)
Chloride: 103 mmol/L (ref 98–111)
Creatinine, Ser: 1.17 mg/dL — ABNORMAL HIGH (ref 0.44–1.00)
GFR, Estimated: 54 mL/min — ABNORMAL LOW (ref >60)
Glucose, Bld: 202 mg/dL — ABNORMAL HIGH (ref 70–99)
Potassium: 3.8 mmol/L (ref 3.5–5.1)
Sodium: 136 mmol/L (ref 135–145)
Total Bilirubin: 0.5 mg/dL (ref 0.3–1.2)
Total Protein: 7.3 g/dL (ref 6.5–8.1)

## 2022-01-27 LAB — CBC WITH DIFFERENTIAL/PLATELET
Abs Immature Granulocytes: 0.02 10*3/uL (ref 0.00–0.07)
Basophils Absolute: 0 10*3/uL (ref 0.0–0.1)
Basophils Relative: 0 %
Eosinophils Absolute: 0.1 10*3/uL (ref 0.0–0.5)
Eosinophils Relative: 2 %
HCT: 39.7 % (ref 36.0–46.0)
Hemoglobin: 12.7 g/dL (ref 12.0–15.0)
Immature Granulocytes: 0 %
Lymphocytes Relative: 31 %
Lymphs Abs: 2.2 10*3/uL (ref 0.7–4.0)
MCH: 27.3 pg (ref 26.0–34.0)
MCHC: 32 g/dL (ref 30.0–36.0)
MCV: 85.4 fL (ref 80.0–100.0)
Monocytes Absolute: 0.9 10*3/uL (ref 0.1–1.0)
Monocytes Relative: 12 %
Neutro Abs: 4 10*3/uL (ref 1.7–7.7)
Neutrophils Relative %: 55 %
Platelets: 323 10*3/uL (ref 150–400)
RBC: 4.65 MIL/uL (ref 3.87–5.11)
RDW: 14.7 % (ref 11.5–15.5)
WBC: 7.3 10*3/uL (ref 4.0–10.5)
nRBC: 0 % (ref 0.0–0.2)

## 2022-01-27 LAB — LIPASE, BLOOD: Lipase: 56 U/L — ABNORMAL HIGH (ref 11–51)

## 2022-01-27 LAB — TROPONIN I (HIGH SENSITIVITY): Troponin I (High Sensitivity): 301 ng/L (ref <18)

## 2022-01-27 MED ORDER — FENTANYL CITRATE PF 50 MCG/ML IJ SOSY
50.0000 ug | PREFILLED_SYRINGE | Freq: Once | INTRAMUSCULAR | Status: AC
  Administered 2022-01-27: 50 ug via INTRAVENOUS
  Filled 2022-01-27: qty 1

## 2022-01-27 MED ORDER — IOHEXOL 350 MG/ML SOLN
80.0000 mL | Freq: Once | INTRAVENOUS | Status: AC | PRN
  Administered 2022-01-27: 80 mL via INTRAVENOUS

## 2022-01-27 MED ORDER — IOHEXOL 9 MG/ML PO SOLN
ORAL | Status: AC
  Filled 2022-01-27: qty 1000

## 2022-01-27 NOTE — ED Provider Notes (Signed)
[name] Provider Note   CSN: [id number] Arrival date & time: 01/27/22  1601     History  Chief Complaint  Patient presents with   Cough    Krista [name] is a 61 y.o. female.  The history is provided by the patient and medical records. No language interpreter was used.  Cough Cough characteristics:  Productive Sputum characteristics:  Green Severity:  Moderate Onset quality:  Gradual Duration:  10 weeks Timing:  Constant Progression:  Unchanged Chronicity:  New Relieved by:  Nothing Worsened by:  Deep breathing Ineffective treatments:  None tried Associated symptoms: chest pain and shortness of breath   Associated symptoms: no chills, no fever, no headaches, no myalgias, no rash, no rhinorrhea, no sinus congestion and no wheezing   Chest pain:    Quality: aching and sharp     Severity:  Severe   Onset quality:  Sudden   Duration:  1 week   Timing:  Constant   Progression:  Waxing and waning   Chronicity:  New      Home Medications Prior to Admission medications   Medication Sig Start Date End Date Taking? Authorizing Provider  ACCU-CHEK GUIDE test strip  10/11/20   [provider]  acetaminophen (TYLENOL) 500 MG tablet Take 1,000 mg by mouth every 6 (six) hours as needed for mild pain or headache.    [provider]  aspirin EC 81 MG EC tablet Take 1 tablet (81 mg total) by mouth daily. 05/17/19   Myles Gip, DO  clopidogrel (PLAVIX) 75 MG tablet Take 1 tablet (75 mg total) by mouth daily. 09/05/19   Lorretta Harp, MD  diltiazem (DILACOR XR) 180 MG 24 hr capsule Take 2 capsules (360 mg total) by mouth daily. 04/09/20   Almyra Deforest, PA  doxycycline (VIBRAMYCIN) 100 MG capsule Take 1 capsule (100 mg total) by mouth 2 (two) times daily. One po bid x 7 days 09/23/20   Domenic Moras, PA-C  doxycycline (VIBRAMYCIN) 100 MG capsule Take 1 capsule (100 mg total) by mouth 2 (two) times daily. One po bid x 7  days 09/23/20   Domenic Moras, PA-C  ergocalciferol (VITAMIN D2) 1.25 MG (50000 UT) capsule Take 50,000 Units by mouth once a week.    [provider]  Evolocumab (REPATHA SURECLICK) 993 MG/ML SOAJ Inject 140 mg into the skin every 14 (fourteen) days. 11/10/19   Lorretta Harp, MD  loratadine (CLARITIN) 10 MG tablet Take 1 tablet (10 mg total) by mouth daily as needed for allergies. 10/28/19   Lorretta Harp, MD  metFORMIN (GLUCOPHAGE) 1000 MG tablet Take 1,000 mg by mouth 2 (two) times daily with a meal. 04/29/19   [provider]  nitroGLYCERIN (NITROSTAT) 0.4 MG SL tablet Place 1 tablet (0.4 mg total) under the tongue every 5 (five) minutes as needed for chest pain. 10/28/19   Lorretta Harp, MD  TRULICITY 7.16 RC/7.8LF SOPN Inject into the skin. 10/11/20   [provider]      Allergies    Statins and Zetia [ezetimibe]    Review of Systems   Review of Systems  Constitutional:  Negative for chills and fever.  HENT:  Negative for rhinorrhea.   Respiratory:  Positive for cough, chest tightness and shortness of breath. Negative for wheezing and stridor.   Cardiovascular:  Positive for chest pain. Negative for palpitations and leg swelling.  Gastrointestinal:  Negative for abdominal pain, constipation, diarrhea, nausea and vomiting.  Genitourinary:  Negative for dysuria.  Musculoskeletal:  Negative for back pain, myalgias, neck pain and neck stiffness.  Skin:  Negative for rash and wound.  Neurological:  Negative for dizziness, weakness, light-headedness and headaches.  Psychiatric/Behavioral:  Negative for agitation and confusion.   All other systems reviewed and are negative.   Physical Exam Updated Vital Signs BP (!) 152/107   Pulse 97   Temp 98.3 F (36.8 C) (Oral)   Resp 18   SpO2 98%  Physical Exam Vitals and nursing note reviewed.  Constitutional:      General: She is not in acute distress.    Appearance: She is well-developed. She is not  ill-appearing, toxic-appearing or diaphoretic.  HENT:     Head: Normocephalic and atraumatic.     Nose: No congestion or rhinorrhea.     Mouth/Throat:     Mouth: Mucous membranes are moist.     Pharynx: No oropharyngeal exudate or posterior oropharyngeal erythema.  Eyes:     Extraocular Movements: Extraocular movements intact.     Conjunctiva/sclera: Conjunctivae normal.     Pupils: Pupils are equal, round, and reactive to light.  Cardiovascular:     Rate and Rhythm: Normal rate and regular rhythm.     Heart sounds: No murmur heard. Pulmonary:     Effort: Pulmonary effort is normal. No respiratory distress.     Breath sounds: Normal breath sounds. No wheezing, rhonchi or rales.  Chest:     Chest wall: Tenderness present.  Abdominal:     Palpations: Abdomen is soft.     Tenderness: There is no abdominal tenderness. There is no right CVA tenderness, left CVA tenderness, guarding or rebound.  Musculoskeletal:        General: No swelling or tenderness.     Cervical back: Neck supple.     Right lower leg: No edema.     Left lower leg: No edema.  Skin:    General: Skin is warm and dry.     Capillary Refill: Capillary refill takes less than 2 seconds.     Findings: No erythema or rash.  Neurological:     General: No focal deficit present.     Mental Status: She is alert.     Sensory: No sensory deficit.     Motor: No weakness.  Psychiatric:        Mood and Affect: Mood normal.     ED Results / Procedures / Treatments   Labs (all labs ordered are listed, but only abnormal results are displayed) Labs Reviewed  COMPREHENSIVE METABOLIC PANEL - Abnormal; Notable for the following components:      Result Value   Glucose, Bld 202 (*)    Creatinine, Ser 1.17 (*)    Albumin 3.0 (*)    GFR, Estimated 54 (*)    All other components within normal limits  LIPASE, BLOOD - Abnormal; Notable for the following components:   Lipase 56 (*)    All other components within normal limits   TROPONIN I (HIGH SENSITIVITY) - Abnormal; Notable for the following components:   Troponin I (High Sensitivity) 301 (*)    All other components within normal limits  CBC WITH DIFFERENTIAL/PLATELET  TROPONIN I (HIGH SENSITIVITY)    EKG None  Radiology CT Angio Chest PE W and/or Wo Contrast  Result Date: 01/27/2022 CLINICAL DATA:  Pulmonary embolism (PE) suspected, high prob History of cancer, pleuritic left-sided chest pain, short of breath. Abnormality on chest x-ray CT recommended. Rule out PE versus  cancer versus pneumonia versus soft tissue injury EXAM: CT ANGIOGRAPHY CHEST WITH CONTRAST TECHNIQUE: Multidetector CT imaging of the chest was performed using the standard protocol during bolus administration of intravenous contrast. Multiplanar CT image reconstructions and MIPs were obtained to evaluate the vascular anatomy. RADIATION DOSE REDUCTION: This exam was performed according to the departmental dose-optimization program which includes automated exposure control, adjustment of the mA and/or kV according to patient size and/or use of iterative reconstruction technique. CONTRAST:  44m OMNIPAQUE IOHEXOL 350 MG/ML SOLN COMPARISON:  Chest radiograph earlier today. No prior chest CT FINDINGS: Cardiovascular: There are no filling defects within the pulmonary arteries to suggest pulmonary embolus. Mild aortic atherosclerosis. The heart is normal in size. There are coronary artery calcifications. No pericardial effusion. Mediastinum/Nodes: There is multifocal mediastinal adenopathy. Anterior paratracheal node versus nodal conglomerate measuring 2 cm short axis series 5, image 45. Subcarinal node versus nodal conglomerate measuring 2.8 cm series 5, image 56. There are multiple enlarged right hilar nodes measuring from 14 to 19 mm. There are enlarged left hilar nodes measuring up to 15 mm. There is a 14 mm right epicardial node. The esophagus is decompressed. Lungs/Pleura: Moderate emphysema and  bronchial thickening. The right lobar bronchi are attenuated due to adjacent adenopathy. There are multiple pulmonary nodules throughout both lungs of varying sizes. Right perihilar spiculated nodule spanning the fissure measures 4 x 2.3 x 3.6 cm and is cavitating series 6, image 47, series 8, image 77. 2.5 cm left upper lobe nodule corresponds to abnormality on CT. Majority of the additional nodules range from 5-15 mm. Fissural thickening in the left lung. No features of pulmonary edema. There is no endobronchial lesion. No pleural effusion. Upper Abdomen: Limited assessment for focal hepatic lesion given phase of contrast. There is a small soft tissue density medial to the right hepatic lobe posterior to the right kidney series 5, image 127. Right hydronephrosis is partially included in the field of view. Lack of upper abdominal bowel fat limits detailed assessment. Musculoskeletal: Nondisplaced left anterior seventh and eighth rib fractures. There is some soft tissue thickening adjacent to the seventh rib fracture suggesting this may be pathologic. No chest wall soft tissue abnormalities. Review of the MIP images confirms the above findings. IMPRESSION: 1. No pulmonary embolus. 2. Findings consistent with intrathoracic malignancy. Multiple pulmonary nodules throughout both lungs of varying sizes, consistent with metastatic disease. Primary site of malignancy may represent a 4 cm spiculated nodule in the right perihilar region spanning the fissure that is cavitating. Alternatively all of these nodules may be metastatic given history of cancer, type not specified. 3. Multifocal mediastinal and bilateral hilar adenopathy, consistent metastatic disease. 4. Nondisplaced left anterior seventh and eighth rib fractures, soft tissue thickening adjacent to the seventh rib fracture suggests this may be pathologic 5. Right hydronephrosis is partially included in the field of view in the upper abdomen. Small soft tissue  density medial to the right hepatic lobe posterior to the right kidney is nonspecific but may represent metastatic disease. Recommend staging CT of the abdomen and pelvis with oral and IV contrast. 6. Moderate emphysema. 7. Aortic atherosclerosis.  Coronary artery calcifications. Aortic Atherosclerosis (ICD10-I70.0) and Emphysema (ICD10-J43.9). Electronically Signed   By: MKeith RakeM.D.   On: 01/27/2022 23:03   DG Chest 2 View  Result Date: 01/27/2022 CLINICAL DATA:  Shortness of breath, cough EXAM: CHEST - 2 VIEW COMPARISON:  Previous studies including the examination of 05/14/2019 FINDINGS: Cardiac size is within normal limits. Thoracic aorta is  tortuous. Right hilum appears more prominent. There is 2.8 cm smooth marginated nodule in the left upper lung fields adjacent to the left hilum.Rest of the lung fields are essentially clear. There is no pleural effusion or pneumothorax. IMPRESSION: There is 2.8 cm nodular density in the left parahilar region. Possibility of malignant neoplasm is not excluded. Follow-up CT chest may be considered. Right hilum appears more prominent. This may be due to prominent central pulmonary vessels or lymphadenopathy. Electronically Signed   By: Elmer Picker M.D.   On: 01/27/2022 17:45    Procedures Procedures    Medications Ordered in ED Medications  fentaNYL (SUBLIMAZE) injection 50 mcg (50 mcg Intravenous Given 01/27/22 2140)  iohexol (OMNIPAQUE) 350 MG/ML injection 80 mL (80 mLs Intravenous Contrast Given 01/27/22 2242)    ED Course/ Medical Decision Making/ A&P                           Medical Decision Making Amount and/or Complexity of Data Reviewed Labs: ordered. Radiology: ordered.  Risk Prescription drug management.    Krista [name] is a 61 y.o. female with a past medical history significant for hypertension, CAD status post PCI, diabetes, and previous ovarian cancer who presents with 2 months of cough as well as 1 week of  significant left-sided chest pain after a fall.  According to patient, for the last 2 months she has been having a productive cough that she cannot shake.  She denies fevers or chills and was not having significant chest pain but occasional shortness of breath.  She reports that last week she got tripped up on a log and fell onto the ground hitting her left chest.  She reports that since that time she has had pleuritic chest pain and worsening shortness of breath with her continued cough.  She denies any abdominal pain or leg pain or leg swelling.  Denies history of DVT or PE.  She reports no hemoptysis.  She denies hitting her head or any other complaints.  On exam, patient does have tenderness in her left chest wall.  Abdomen was nontender.  No rash to suggest shingles.  Lungs were clear on my auscultation and there is no murmur.  Good pulses in extremities.  Legs nontender and nonedematous.  EKG did not show STEMI.  Patient had some labs and a chest x-ray in triage which showed a mass in the left chest.  Given the patient's history of cancer and the pleuritic pain and shortness of breath despite recent trauma I do feel that a CT PE study will help rule out subtle pneumothorax, pneumonia, rib fractures, or pulmonary contusion.  We will get the CT PE study as well as lipase given the pain and that left lower chest/upper abdomen area.  If work-up is reassuring, anticipate discharge home for outpatient follow-up.  Patient CT scan of the chest returned showing no evidence of pulmonary embolism but unfortunately does show likely diffuse metastatic disease in the chest and 2 pathologic rib fractures on the left chest.  I suspect the rib fractures are from the recent fall causing some of her pain however the CT also showed evidence of some hydronephrosis and some mass near the liver.  They recommended CT abdomen and pelvis with oral and IV contrast to further evaluate.  Due to the patient's pain being up in the  upper abdomen and the chest, we will get the CT scan.  We discussed that if we do  not find any other concerning findings that would require admission at this time, patient may be stable for discharge home to follow-up with oncology next week with prescription for pain medicine however if we do find concerning things, she may need admission for further monitoring and management.  Care transferred to oncoming team to await results of CT abdomen pelvis and reassessment.         Final Clinical Impression(s) / ED Diagnoses Final diagnoses:  Closed fracture of multiple ribs of left side, initial encounter     Clinical Impression: 1. Closed fracture of multiple ribs of left side, initial encounter     Disposition: Care transferred to oncoming team to await results of CT abdomen pelvis and reassessment.  This note was prepared with assistance of Systems analyst. Occasional wrong-word or sound-a-like substitutions may have occurred due to the inherent limitations of voice recognition software.      Jamonta Goerner, Gwenyth Allegra, MD 01/27/22 2350

## 2022-01-27 NOTE — ED Provider Notes (Signed)
Blood pressure (!) 180/109, pulse 79, temperature 98.3 F (36.8 C), temperature source Oral, resp. rate (!) 21, height '5\' 3"'$  (1.6 m), weight 68 kg, SpO2 93 %.  Assuming care from Dr. Sherry Ruffing.  In short, Krista [name] is a 61 y.o. female with a chief complaint of Cough .  Refer to the original H&P for additional details.  The current plan of care is to follow up on CT A/P after CT chest finding metastatic cancer and pathologic fractures.    Margette Fast, MD 01/28/22 [phone]

## 2022-01-27 NOTE — ED Notes (Signed)
Per pt she fell several days ago and landed on her lt side. C/o pain to the rib area just below her lt breast and she has now developed a cough

## 2022-01-27 NOTE — ED Triage Notes (Signed)
Patient here with complaint of productive cough for the last two months. Patient has tried multiple OTC remedies with no relief. Patient also reports tripping and falling on her left side last week and has had left rib pain ever since. Patient is alert, oriented, ambulatory, and in no apparent distress at this time.

## 2022-01-27 NOTE — ED Provider Triage Note (Signed)
Emergency Medicine Provider Triage Evaluation Note  ZANAYAH SHADOWENS , a 61 y.o. female  was evaluated in triage.  Pt complains of cough onset 2 months.  Has tried over-the-counter regimens no relief for her symptoms.  Denies past medical history of asthma or COPD.  Also notes that she tripped and fell on her left side last week and has had left sided rib pain.  Denies hitting her head or LOC.  Denies nausea, vomiting, shortness of breath, chest pain.  Review of Systems  Positive: As per HPI above Negative:   Physical Exam  BP (!) 152/107   Pulse 97   Temp 98.3 F (36.8 C) (Oral)   Resp 18   SpO2 98%  Gen:   Awake, no distress   Resp:  Normal effort  MSK:   Moves extremities without difficulty  Other:  Tenderness to palpation noted to left lateral ribs without obvious deformity.  Medical Decision Making  Medically screening exam initiated at 5:13 PM.  Appropriate orders placed.  CHANICE BRENTON was informed that the remainder of the evaluation will be completed by another provider, this initial triage assessment does not replace that evaluation, and the importance of remaining in the ED until their evaluation is complete.  Work-up initiated   Ishana Blades A, PA-C 01/27/22 1715

## 2022-01-28 ENCOUNTER — Encounter: Payer: Self-pay | Admitting: Hematology and Oncology

## 2022-01-28 ENCOUNTER — Emergency Department (HOSPITAL_COMMUNITY): Payer: Medicare Other

## 2022-01-28 DIAGNOSIS — C799 Secondary malignant neoplasm of unspecified site: Secondary | ICD-10-CM

## 2022-01-28 DIAGNOSIS — R778 Other specified abnormalities of plasma proteins: Secondary | ICD-10-CM | POA: Diagnosis not present

## 2022-01-28 DIAGNOSIS — Z1501 Genetic susceptibility to malignant neoplasm of breast: Secondary | ICD-10-CM | POA: Diagnosis not present

## 2022-01-28 DIAGNOSIS — I2510 Atherosclerotic heart disease of native coronary artery without angina pectoris: Secondary | ICD-10-CM | POA: Diagnosis present

## 2022-01-28 DIAGNOSIS — C541 Malignant neoplasm of endometrium: Secondary | ICD-10-CM | POA: Diagnosis present

## 2022-01-28 DIAGNOSIS — R042 Hemoptysis: Secondary | ICD-10-CM | POA: Diagnosis not present

## 2022-01-28 DIAGNOSIS — C7800 Secondary malignant neoplasm of unspecified lung: Secondary | ICD-10-CM | POA: Insufficient documentation

## 2022-01-28 DIAGNOSIS — Z6826 Body mass index (BMI) 26.0-26.9, adult: Secondary | ICD-10-CM | POA: Diagnosis not present

## 2022-01-28 DIAGNOSIS — C7802 Secondary malignant neoplasm of left lung: Secondary | ICD-10-CM | POA: Diagnosis present

## 2022-01-28 DIAGNOSIS — R7989 Other specified abnormal findings of blood chemistry: Secondary | ICD-10-CM

## 2022-01-28 DIAGNOSIS — Z515 Encounter for palliative care: Secondary | ICD-10-CM

## 2022-01-28 DIAGNOSIS — G893 Neoplasm related pain (acute) (chronic): Secondary | ICD-10-CM

## 2022-01-28 DIAGNOSIS — Z7189 Other specified counseling: Secondary | ICD-10-CM

## 2022-01-28 DIAGNOSIS — Z91199 Patient's noncompliance with other medical treatment and regimen due to unspecified reason: Secondary | ICD-10-CM | POA: Insufficient documentation

## 2022-01-28 DIAGNOSIS — C7989 Secondary malignant neoplasm of other specified sites: Secondary | ICD-10-CM | POA: Diagnosis present

## 2022-01-28 DIAGNOSIS — N183 Chronic kidney disease, stage 3 unspecified: Secondary | ICD-10-CM | POA: Insufficient documentation

## 2022-01-28 DIAGNOSIS — C787 Secondary malignant neoplasm of liver and intrahepatic bile duct: Secondary | ICD-10-CM | POA: Diagnosis present

## 2022-01-28 DIAGNOSIS — E1165 Type 2 diabetes mellitus with hyperglycemia: Secondary | ICD-10-CM | POA: Diagnosis present

## 2022-01-28 DIAGNOSIS — Z66 Do not resuscitate: Secondary | ICD-10-CM | POA: Diagnosis not present

## 2022-01-28 DIAGNOSIS — F17210 Nicotine dependence, cigarettes, uncomplicated: Secondary | ICD-10-CM | POA: Diagnosis present

## 2022-01-28 DIAGNOSIS — E440 Moderate protein-calorie malnutrition: Secondary | ICD-10-CM

## 2022-01-28 DIAGNOSIS — S2242XA Multiple fractures of ribs, left side, initial encounter for closed fracture: Secondary | ICD-10-CM

## 2022-01-28 DIAGNOSIS — C7801 Secondary malignant neoplasm of right lung: Secondary | ICD-10-CM | POA: Diagnosis present

## 2022-01-28 DIAGNOSIS — N182 Chronic kidney disease, stage 2 (mild): Secondary | ICD-10-CM | POA: Diagnosis present

## 2022-01-28 DIAGNOSIS — N1330 Unspecified hydronephrosis: Secondary | ICD-10-CM

## 2022-01-28 DIAGNOSIS — W1830XA Fall on same level, unspecified, initial encounter: Secondary | ICD-10-CM | POA: Diagnosis present

## 2022-01-28 DIAGNOSIS — N179 Acute kidney failure, unspecified: Secondary | ICD-10-CM | POA: Diagnosis present

## 2022-01-28 DIAGNOSIS — N131 Hydronephrosis with ureteral stricture, not elsewhere classified: Secondary | ICD-10-CM | POA: Diagnosis present

## 2022-01-28 DIAGNOSIS — M1990 Unspecified osteoarthritis, unspecified site: Secondary | ICD-10-CM | POA: Diagnosis present

## 2022-01-28 DIAGNOSIS — E1142 Type 2 diabetes mellitus with diabetic polyneuropathy: Secondary | ICD-10-CM | POA: Diagnosis present

## 2022-01-28 DIAGNOSIS — R52 Pain, unspecified: Secondary | ICD-10-CM

## 2022-01-28 DIAGNOSIS — J439 Emphysema, unspecified: Secondary | ICD-10-CM | POA: Diagnosis present

## 2022-01-28 DIAGNOSIS — I129 Hypertensive chronic kidney disease with stage 1 through stage 4 chronic kidney disease, or unspecified chronic kidney disease: Secondary | ICD-10-CM | POA: Diagnosis present

## 2022-01-28 DIAGNOSIS — S2249XA Multiple fractures of ribs, unspecified side, initial encounter for closed fracture: Secondary | ICD-10-CM

## 2022-01-28 DIAGNOSIS — M8458XA Pathological fracture in neoplastic disease, other specified site, initial encounter for fracture: Secondary | ICD-10-CM | POA: Diagnosis present

## 2022-01-28 DIAGNOSIS — I700 Atherosclerosis of aorta: Secondary | ICD-10-CM | POA: Diagnosis present

## 2022-01-28 DIAGNOSIS — R059 Cough, unspecified: Secondary | ICD-10-CM

## 2022-01-28 DIAGNOSIS — E785 Hyperlipidemia, unspecified: Secondary | ICD-10-CM | POA: Diagnosis present

## 2022-01-28 DIAGNOSIS — I10 Essential (primary) hypertension: Secondary | ICD-10-CM | POA: Diagnosis not present

## 2022-01-28 LAB — COMPREHENSIVE METABOLIC PANEL
ALT: 11 U/L (ref 0–44)
AST: 14 U/L — ABNORMAL LOW (ref 15–41)
Albumin: 2.9 g/dL — ABNORMAL LOW (ref 3.5–5.0)
Alkaline Phosphatase: 53 U/L (ref 38–126)
Anion gap: 11 (ref 5–15)
BUN: 11 mg/dL (ref 6–20)
CO2: 23 mmol/L (ref 22–32)
Calcium: 8.6 mg/dL — ABNORMAL LOW (ref 8.9–10.3)
Chloride: 101 mmol/L (ref 98–111)
Creatinine, Ser: 1.15 mg/dL — ABNORMAL HIGH (ref 0.44–1.00)
GFR, Estimated: 55 mL/min — ABNORMAL LOW (ref >60)
Glucose, Bld: 137 mg/dL — ABNORMAL HIGH (ref 70–99)
Potassium: 3.8 mmol/L (ref 3.5–5.1)
Sodium: 135 mmol/L (ref 135–145)
Total Bilirubin: 0.4 mg/dL (ref 0.3–1.2)
Total Protein: 7 g/dL (ref 6.5–8.1)

## 2022-01-28 LAB — CBC WITH DIFFERENTIAL/PLATELET
Abs Immature Granulocytes: 0.02 10*3/uL (ref 0.00–0.07)
Basophils Absolute: 0 10*3/uL (ref 0.0–0.1)
Basophils Relative: 0 %
Eosinophils Absolute: 0.1 10*3/uL (ref 0.0–0.5)
Eosinophils Relative: 2 %
HCT: 36.7 % (ref 36.0–46.0)
Hemoglobin: 12.4 g/dL (ref 12.0–15.0)
Immature Granulocytes: 0 %
Lymphocytes Relative: 31 %
Lymphs Abs: 2.1 10*3/uL (ref 0.7–4.0)
MCH: 28 pg (ref 26.0–34.0)
MCHC: 33.8 g/dL (ref 30.0–36.0)
MCV: 82.8 fL (ref 80.0–100.0)
Monocytes Absolute: 0.8 10*3/uL (ref 0.1–1.0)
Monocytes Relative: 11 %
Neutro Abs: 3.8 10*3/uL (ref 1.7–7.7)
Neutrophils Relative %: 56 %
Platelets: 274 10*3/uL (ref 150–400)
RBC: 4.43 MIL/uL (ref 3.87–5.11)
RDW: 14.6 % (ref 11.5–15.5)
WBC: 6.9 10*3/uL (ref 4.0–10.5)
nRBC: 0 % (ref 0.0–0.2)

## 2022-01-28 LAB — GLUCOSE, CAPILLARY
Glucose-Capillary: 211 mg/dL — ABNORMAL HIGH (ref 70–99)
Glucose-Capillary: 180 mg/dL — ABNORMAL HIGH (ref 70–99)
Glucose-Capillary: 165 mg/dL — ABNORMAL HIGH (ref 70–99)
Glucose-Capillary: 298 mg/dL — ABNORMAL HIGH (ref 70–99)

## 2022-01-28 LAB — TROPONIN I (HIGH SENSITIVITY): Troponin I (High Sensitivity): 277 ng/L (ref <18)

## 2022-01-28 LAB — MRSA NEXT GEN BY PCR, NASAL: MRSA by PCR Next Gen: NOT DETECTED

## 2022-01-28 LAB — HEMOGLOBIN A1C
Hgb A1c MFr Bld: 8.2 % — ABNORMAL HIGH (ref 4.8–5.6)
Mean Plasma Glucose: 188.64 mg/dL

## 2022-01-28 LAB — MAGNESIUM: Magnesium: 1.8 mg/dL (ref 1.7–2.4)

## 2022-01-28 LAB — HIV ANTIBODY (ROUTINE TESTING W REFLEX): HIV Screen 4th Generation wRfx: NONREACTIVE

## 2022-01-28 LAB — PHOSPHORUS: Phosphorus: 3.8 mg/dL (ref 2.5–4.6)

## 2022-01-28 MED ORDER — PROCHLORPERAZINE EDISYLATE 10 MG/2ML IJ SOLN: 10.0000 mg | Freq: Four times a day (QID) | INTRAMUSCULAR | Status: DC | PRN

## 2022-01-28 MED ORDER — OXYCODONE HCL 5 MG PO TABS
5.0000 mg | ORAL_TABLET | Freq: Four times a day (QID) | ORAL | Status: DC | PRN
  Administered 2022-01-28: 5 mg via ORAL
  Filled 2022-01-28: qty 1

## 2022-01-28 MED ORDER — GUAIFENESIN ER 600 MG PO TB12
600.0000 mg | ORAL_TABLET | Freq: Two times a day (BID) | ORAL | Status: DC
  Administered 2022-01-28 – 2022-02-01 (×9): 600 mg via ORAL
  Filled 2022-01-28 (×9): qty 1

## 2022-01-28 MED ORDER — HYDROMORPHONE HCL 1 MG/ML IJ SOLN: 0.5000 mg | INTRAMUSCULAR | Status: DC | PRN

## 2022-01-28 MED ORDER — KETOROLAC TROMETHAMINE 15 MG/ML IJ SOLN
15.0000 mg | Freq: Four times a day (QID) | INTRAMUSCULAR | Status: AC
Start: 2022-01-28 — End: 2022-01-29
  Administered 2022-01-28 – 2022-01-29 (×4): 15 mg via INTRAVENOUS
  Filled 2022-01-28 (×4): qty 1

## 2022-01-28 MED ORDER — ENOXAPARIN SODIUM 40 MG/0.4ML IJ SOSY
40.0000 mg | PREFILLED_SYRINGE | INTRAMUSCULAR | Status: DC
  Administered 2022-01-28 – 2022-01-31 (×4): 40 mg via SUBCUTANEOUS
  Filled 2022-01-28 (×4): qty 0.4

## 2022-01-28 MED ORDER — MELATONIN 5 MG PO TABS
5.0000 mg | ORAL_TABLET | Freq: Every evening | ORAL | Status: DC | PRN
  Administered 2022-01-29: 5 mg via ORAL
  Filled 2022-01-28: qty 1

## 2022-01-28 MED ORDER — LACTATED RINGERS IV SOLN: INTRAVENOUS | Status: DC

## 2022-01-28 MED ORDER — LIDOCAINE 5 % EX PTCH
2.0000 | MEDICATED_PATCH | CUTANEOUS | Status: DC
  Administered 2022-01-28 – 2022-01-31 (×4): 2 via TRANSDERMAL
  Filled 2022-01-28 (×5): qty 2

## 2022-01-28 MED ORDER — SENNOSIDES-DOCUSATE SODIUM 8.6-50 MG PO TABS
1.0000 | ORAL_TABLET | Freq: Every day | ORAL | Status: DC
  Administered 2022-01-28 – 2022-01-31 (×4): 1 via ORAL
  Filled 2022-01-28 (×4): qty 1

## 2022-01-28 MED ORDER — AMLODIPINE BESYLATE 5 MG PO TABS
5.0000 mg | ORAL_TABLET | Freq: Every day | ORAL | Status: DC
  Administered 2022-01-28 – 2022-02-01 (×5): 5 mg via ORAL
  Filled 2022-01-28 (×5): qty 1

## 2022-01-28 MED ORDER — INSULIN ASPART 100 UNIT/ML IJ SOLN
0.0000 [IU] | Freq: Every day | INTRAMUSCULAR | Status: DC
  Administered 2022-01-28: 3 [IU] via SUBCUTANEOUS

## 2022-01-28 MED ORDER — ACETAMINOPHEN 325 MG PO TABS: 650.0000 mg | ORAL_TABLET | Freq: Four times a day (QID) | ORAL | Status: DC | PRN

## 2022-01-28 MED ORDER — OXYCODONE HCL 5 MG PO TABS
5.0000 mg | ORAL_TABLET | ORAL | Status: DC | PRN
  Administered 2022-01-29 – 2022-01-31 (×3): 5 mg via ORAL
  Filled 2022-01-28 (×3): qty 1

## 2022-01-28 MED ORDER — IOHEXOL 300 MG/ML  SOLN
100.0000 mL | Freq: Once | INTRAMUSCULAR | Status: AC | PRN
  Administered 2022-01-28: 100 mL via INTRAVENOUS

## 2022-01-28 MED ORDER — BENZONATATE 100 MG PO CAPS: 100.0000 mg | ORAL_CAPSULE | Freq: Three times a day (TID) | ORAL | Status: DC | PRN

## 2022-01-28 MED ORDER — POLYETHYLENE GLYCOL 3350 17 G PO PACK: 17.0000 g | PACK | Freq: Every day | ORAL | Status: DC | PRN

## 2022-01-28 MED ORDER — ACETAMINOPHEN 500 MG PO TABS
1000.0000 mg | ORAL_TABLET | Freq: Three times a day (TID) | ORAL | Status: AC
  Administered 2022-01-28 – 2022-01-31 (×9): 1000 mg via ORAL
  Filled 2022-01-28 (×9): qty 2

## 2022-01-28 MED ORDER — INSULIN ASPART 100 UNIT/ML IJ SOLN
0.0000 [IU] | Freq: Three times a day (TID) | INTRAMUSCULAR | Status: DC
  Administered 2022-01-28: 2 [IU] via SUBCUTANEOUS
  Administered 2022-01-28: 3 [IU] via SUBCUTANEOUS
  Administered 2022-01-28: 2 [IU] via SUBCUTANEOUS
  Administered 2022-01-29: 3 [IU] via SUBCUTANEOUS
  Administered 2022-01-29: 2 [IU] via SUBCUTANEOUS
  Administered 2022-01-29: 1 [IU] via SUBCUTANEOUS
  Administered 2022-01-30 (×2): 2 [IU] via SUBCUTANEOUS
  Administered 2022-01-30 – 2022-01-31 (×3): 1 [IU] via SUBCUTANEOUS
  Administered 2022-02-01: 2 [IU] via SUBCUTANEOUS

## 2022-01-28 NOTE — ED Notes (Signed)
Pt ambulated to restroom without assistance at this time ?

## 2022-01-28 NOTE — Assessment & Plan Note (Signed)
Palliative c/s

## 2022-01-28 NOTE — Progress Notes (Signed)
PROGRESS NOTE    Krista [name]  NTI:[id number] DOB: 10/29/1960 DOA: 01/27/2022 PCP: Drue Flirt, MD  Chief Complaint  Patient presents with   Cough    Brief Narrative:  Krista [name] is Krista [name] 61 y.o. female with medical history significant for endometrial carcinoma, coronary artery disease, prior MI, type 2 diabetes, hyperlipidemia, hypertension, chronic anxiety/depression, former tobacco use disorder quit 2 weeks ago, who presented to [name] ED from home due to left-sided chest pain after falling on her left side 1 week ago.  Associated with 1 episode of hemoptysis 3 weeks ago and Krista [name] productive cough of yellow sputum.      Assessment & Plan:   Principal Problem:   Generalized pain Active Problems:   Cancer related pain   Rib fractures   Metastatic disease ([place])   Endometrial cancer ([name])   Hydronephrosis of right kidney   Hemoptysis   Elevated troponin   Cough   Elevated serum creatinine   Protein-calorie malnutrition, moderate ([name])   TOBACCO ABUSE   Type 2 diabetes mellitus ([name])   Goals of care, counseling/discussion   Assessment and Plan: Cancer related pain Pain primarily in L chest, likely due to rib fractures which appear pathologic by imaging.  No R sided abdominal/flank pain (notable in setting of R hydro. Scheduled apap, toradol (limit to 1 day with L sided hydro, unclear baseline creatinine - hydrate).  Prn oxycodone, prn dilaudid.  Lidocaine patch.  PT/OT.  Rib fractures Related to mechanical fall and metastasis (appeared pathologic) Pain control, pulm toilet   Hemoptysis Related to metastatic disease Occurred 3 weeks ago Follow closely  Hydronephrosis of right kidney Related to metastatic disease Discussed with urology on call, given no R sided flank pain, creatinine not significantly elevated, can pursue outpatient follow up with [facility]  Endometrial cancer [name]) previously followed at [facility] and was treated with Robotic assisted  hysterectomy with BSO, sentinel lymph node dissection 12/2020.  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  Last oncology note was 03/2021 and looks like plan for cycle #1 of Taxol + Carbo + Herceptin (looks like given 03/23/2021, she appears to have been lost to follow up after this).   She wants to follow with oncologists in [place] now due to issues getting to [facility]    Metastatic disease [name]) Presume this is related to hx endometrial cancer? CT chest notes 4 cm spiculated nodule in R perihilatr region that is cavitating -> could be primary site? CT chest with multiple pulm nodules c/w metastatic disease, multifocal medistinal and bilateral hilar adenopathy, c/w metastatic disease CT abd/pelvis with multiple abdominal and pelvic implants as well  Elevated troponin No chest pain other than L sided rib pain downtrending at this time Similar troponin elevation in 2020, at that time, thought demand (peaked to 501) EKG without acute findings (mild inferior ST depression? Appears chronic) Follow echo Consider cards pending echo, symptoms  Cough Symptomatic management Related to pulm metastatic disease  Elevated serum creatinine Unclear recent baseline Follow with IVF  Type 2 diabetes mellitus ([name]) A1c pending SSI  TOBACCO ABUSE Quit 2 weeks ago  Goals of care, counseling/discussion Palliative c/s     DVT prophylaxis: lovenox Code Status: full Family Communication: none Disposition:   Status is: Inpatient Remains inpatient appropriate because: oncology c/s, pain control   Consultants:  Oncology Urology (phone)  Procedures:  none  Antimicrobials:  Anti-infectives (From admission, onward)    None       Subjective:  C/o 9/10 pain (appears comfortable) No R sided pain in belly or flank L sided chest pain near ribs  Objective: Vitals:   01/28/22 0645 01/28/22 0747 01/28/22 0800 01/28/22 0841  BP:   (!) 181/100 (!) 157/89  Pulse:  82  94   Resp: (!) 28  (!) 21   Temp:  99.1 F (37.3 C)  98.2 F (36.8 C)  TempSrc:  Oral  Oral  SpO2: 95%  90%   Weight:      Height:       No intake or output data in the 24 hours ending 01/28/22 0958 Filed Weights   01/27/22 2137  Weight: 68 kg    Examination:  General exam: Appears calm and comfortable  Respiratory system: unlabored Cardiovascular system:RRR. TTP to L chest. Gastrointestinal system: Abdomen is nondistended, soft and nontender.  Central nervous system: Alert and oriented. No focal neurological deficits. Extremities: no LEE    Data Reviewed: I have personally reviewed following labs and imaging studies  CBC: Recent Labs  Lab 01/27/22 1709 01/28/22 0608  WBC 7.3 6.9  NEUTROABS 4.0 3.8  HGB 12.7 12.4  HCT 39.7 36.7  MCV 85.4 82.8  PLT 323 518    Basic Metabolic Panel: Recent Labs  Lab 01/27/22 1709 01/28/22 0608  NA 136 135  K 3.8 3.8  CL 103 101  CO2 24 23  GLUCOSE 202* 137*  BUN 14 11  CREATININE 1.17* 1.15*  CALCIUM 8.9 8.6*  MG  --  1.8  PHOS  --  3.8    GFR: Estimated Creatinine Clearance: 49.3 mL/min (Krista [name]) (by C-G formula based on SCr of 1.15 mg/dL (H)).  Liver Function Tests: Recent Labs  Lab 01/27/22 1709 01/28/22 0608  AST 15 14*  ALT 12 11  ALKPHOS 56 53  BILITOT 0.5 0.4  PROT 7.3 7.0  ALBUMIN 3.0* 2.9*    CBG: Recent Labs  Lab 01/28/22 0917  GLUCAP 211*     No results found for this or any previous visit (from the past 240 hour(s)).       Radiology Studies: CT ABDOMEN PELVIS W CONTRAST  Result Date: 01/28/2022 CLINICAL DATA:  Evaluate for metastatic disease. EXAM: CT ABDOMEN AND PELVIS WITH CONTRAST TECHNIQUE: Multidetector CT imaging of the abdomen and pelvis was performed using the standard protocol following bolus administration of intravenous contrast. RADIATION DOSE REDUCTION: This exam was performed according to the departmental dose-optimization program which includes automated exposure  control, adjustment of the mA and/or kV according to patient size and/or use of iterative reconstruction technique. CONTRAST:  117m OMNIPAQUE IOHEXOL 300 MG/ML  SOLN COMPARISON:  CT abdomen pelvis dated 09/22/2020. chest CT dated 01/27/2022. FINDINGS: Lower chest: Multiple pulmonary nodules in the visualized right lung base consistent with metastatic disease. Partially visualized right perihilar nodular densities (1/3) corresponding to the right hilar mass or lymphadenopathy. These findings are better evaluated on the chest CT of 01/27/2022. No intra-abdominal free air.  Small free fluid in the pelvis. Hepatobiliary: Faint small nodular deposit along the liver capsule in the right lobe of the liver (12/3) measuring up to 12 mm most consistent with metastatic implant. No intrahepatic biliary ductal dilatation. The gallbladder is unremarkable. Pancreas: The pancreas is unremarkable. No active inflammatory changes. No dilatation of the main pancreatic duct. Spleen: The spleen is unremarkable. Adrenals/Urinary Tract: The adrenal glands are unremarkable. There is moderate right hydronephrosis and mild right hydroureter secondary to compression and obstruction of the distal right ureter by Misk Galentine metastatic lesion in the  right hemipelvis. The left kidney is unremarkable. The urinary bladder is minimally distended and grossly unremarkable. Stomach/Bowel: There is no bowel obstruction or active inflammation. The appendix is normal. Vascular/Lymphatic: Moderate aortoiliac atherosclerotic disease. The IVC is unremarkable. No portal venous gas. No retroperitoneal adenopathy. Reproductive: Hysterectomy. Other: Several pelvic masses consistent with metastatic disease. The masses demonstrate central low attenuation, likely necrotic tissue. The largest measuring 4.4 x 3.5 x 4.7 cm in the right hemipelvis. This mass causes compression of the distal right ureter with associated right-sided hydronephrosis. Additional metastatic implants  in the omentum and in the left upper abdomen measure up to 4.3 x 3.3 cm in axial dimensions (42/3). There is Kyan Giannone 4.5 x 2.3 cm metastatic implant in the right lateral abdominal wall musculature (45/3). Faint 1.4 x 1.0 cm nodular density over the left diaphragmatic crus (22/3) suspicious for metastatic implant. Musculoskeletal: There is osteopenia with degenerative changes of the spine. No acute osseous pathology. No suspicious bone lesions. IMPRESSION: 1. Multiple abdominal and pelvic metastatic implants as above. 2. Moderate right hydronephrosis secondary to mass effect and obstruction of the distal right ureter by Chinenye Katzenberger right pelvic metastatic implant. 3. Multiple pulmonary metastatic disease in the visualized right lung base. 4. No bowel obstruction. Normal appendix. 5. Aortic Atherosclerosis (ICD10-I70.0). Electronically Signed   By: Anner Crete M.D.   On: 01/28/2022 03:41   CT Angio Chest PE W and/or Wo Contrast  Result Date: 01/27/2022 CLINICAL DATA:  Pulmonary embolism (PE) suspected, high prob History of cancer, pleuritic left-sided chest pain, short of breath. Abnormality on chest x-ray CT recommended. Rule out PE versus cancer versus pneumonia versus soft tissue injury EXAM: CT ANGIOGRAPHY CHEST WITH CONTRAST TECHNIQUE: Multidetector CT imaging of the chest was performed using the standard protocol during bolus administration of intravenous contrast. Multiplanar CT image reconstructions and MIPs were obtained to evaluate the vascular anatomy. RADIATION DOSE REDUCTION: This exam was performed according to the departmental dose-optimization program which includes automated exposure control, adjustment of the mA and/or kV according to patient size and/or use of iterative reconstruction technique. CONTRAST:  55m OMNIPAQUE IOHEXOL 350 MG/ML SOLN COMPARISON:  Chest radiograph earlier today. No prior chest CT FINDINGS: Cardiovascular: There are no filling defects within the pulmonary arteries to suggest  pulmonary embolus. Mild aortic atherosclerosis. The heart is normal in size. There are coronary artery calcifications. No pericardial effusion. Mediastinum/Nodes: There is multifocal mediastinal adenopathy. Anterior paratracheal node versus nodal conglomerate measuring 2 cm short axis series 5, image 45. Subcarinal node versus nodal conglomerate measuring 2.8 cm series 5, image 56. There are multiple enlarged right hilar nodes measuring from 14 to 19 mm. There are enlarged left hilar nodes measuring up to 15 mm. There is Shawan Corella 14 mm right epicardial node. The esophagus is decompressed. Lungs/Pleura: Moderate emphysema and bronchial thickening. The right lobar bronchi are attenuated due to adjacent adenopathy. There are multiple pulmonary nodules throughout both lungs of varying sizes. Right perihilar spiculated nodule spanning the fissure measures 4 x 2.3 x 3.6 cm and is cavitating series 6, image 47, series 8, image 77. 2.5 cm left upper lobe nodule corresponds to abnormality on CT. Majority of the additional nodules range from 5-15 mm. Fissural thickening in the left lung. No features of pulmonary edema. There is no endobronchial lesion. No pleural effusion. Upper Abdomen: Limited assessment for focal hepatic lesion given phase of contrast. There is Seaver Machia small soft tissue density medial to the right hepatic lobe posterior to the right kidney series 5, image 127. Right  hydronephrosis is partially included in the field of view. Lack of upper abdominal bowel fat limits detailed assessment. Musculoskeletal: Nondisplaced left anterior seventh and eighth rib fractures. There is some soft tissue thickening adjacent to the seventh rib fracture suggesting this may be pathologic. No chest wall soft tissue abnormalities. Review of the MIP images confirms the above findings. IMPRESSION: 1. No pulmonary embolus. 2. Findings consistent with intrathoracic malignancy. Multiple pulmonary nodules throughout both lungs of varying sizes,  consistent with metastatic disease. Primary site of malignancy may represent Ian Castagna 4 cm spiculated nodule in the right perihilar region spanning the fissure that is cavitating. Alternatively all of these nodules may be metastatic given history of cancer, type not specified. 3. Multifocal mediastinal and bilateral hilar adenopathy, consistent metastatic disease. 4. Nondisplaced left anterior seventh and eighth rib fractures, soft tissue thickening adjacent to the seventh rib fracture suggests this may be pathologic 5. Right hydronephrosis is partially included in the field of view in the upper abdomen. Small soft tissue density medial to the right hepatic lobe posterior to the right kidney is nonspecific but may represent metastatic disease. Recommend staging CT of the abdomen and pelvis with oral and IV contrast. 6. Moderate emphysema. 7. Aortic atherosclerosis.  Coronary artery calcifications. Aortic Atherosclerosis (ICD10-I70.0) and Emphysema (ICD10-J43.9). Electronically Signed   By: Keith Rake M.D.   On: 01/27/2022 23:03   DG Chest 2 View  Result Date: 01/27/2022 CLINICAL DATA:  Shortness of breath, cough EXAM: CHEST - 2 VIEW COMPARISON:  Previous studies including the examination of 05/14/2019 FINDINGS: Cardiac size is within normal limits. Thoracic aorta is tortuous. Right hilum appears more prominent. There is 2.8 cm smooth marginated nodule in the left upper lung fields adjacent to the left hilum.Rest of the lung fields are essentially clear. There is no pleural effusion or pneumothorax. IMPRESSION: There is 2.8 cm nodular density in the left parahilar region. Possibility of malignant neoplasm is not excluded. Follow-up CT chest may be considered. Right hilum appears more prominent. This may be due to prominent central pulmonary vessels or lymphadenopathy. Electronically Signed   By: Elmer Picker M.D.   On: 01/27/2022 17:45        Scheduled Meds:  acetaminophen  1,000 mg Oral Q8H    amLODipine  5 mg Oral Daily   enoxaparin (LOVENOX) injection  40 mg Subcutaneous Q24H   guaiFENesin  600 mg Oral BID   insulin aspart  0-5 Units Subcutaneous QHS   insulin aspart  0-9 Units Subcutaneous TID WC   ketorolac  15 mg Intravenous Q6H   lidocaine  2 patch Transdermal Q24H   senna-docusate  1 tablet Oral QHS   Continuous Infusions:  lactated ringers 100 mL/hr at 01/28/22 0943     LOS: 0 days    Time spent: over 30 min    Fayrene Helper, MD [facility]   To contact the attending provider between 7A-7P or the covering provider during after hours 7P-7A, please log into the web site [email or web address] and access using universal [place] password for that web site. If you do not have the password, please call the hospital operator.  01/28/2022, 9:58 AM

## 2022-01-28 NOTE — Assessment & Plan Note (Signed)
Related to metastatic disease Occurred 3 weeks ago Follow closely

## 2022-01-28 NOTE — ED Notes (Signed)
Pt ambulated to restroom at this time.

## 2022-01-28 NOTE — Assessment & Plan Note (Addendum)
Pain primarily in L chest, likely due to rib fractures which appear pathologic by imaging.  No R sided abdominal/flank pain (notable in setting of R hydro. Scheduled apap, stop toradol today (limited to 1 day with L sided hydro, unclear baseline creatinine - hydrate).  Prn oxycodone, prn dilaudid.  Lidocaine patch.  She notes pain much better today, well controlled PT/OT.

## 2022-01-28 NOTE — Progress Notes (Signed)
Consult for port access: CXR reviewed catheter tip "in the region of the innominate vein". Will need MD clarification if OK to use port (placed at [facility]).

## 2022-01-28 NOTE — Progress Notes (Signed)
Paged Dr. Serafina Royals, radiologist to get clarification on [facility] use. Awaiting return call.

## 2022-01-28 NOTE — Assessment & Plan Note (Addendum)
previously followed at [facility] and was treated with Robotic assisted hysterectomy with BSO, sentinel lymph node dissection 12/2020.  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  Last oncology note was 03/2021 and looks like plan for cycle #1 of Taxol + Carbo + Herceptin (looks like given 03/23/2021, she appears to have been lost to follow up after this).   Appreciate oncology assistance, planning for transfer to [facility] for chemotherapy (awaiting echo first given elevated troponin at presentation)

## 2022-01-28 NOTE — Consult Note (Signed)
[facility] NOTE  Patient Care Team: Drue Flirt, MD as PCP - General (Family Medicine) Lorretta Harp, MD as PCP - Cardiology (Cardiology) Heath Lark, MD as Consulting Physician (Hematology and Oncology)  ASSESSMENT & PLAN:  Recurrent, metastatic uterine cancer, high-grade serous, HER2 positive disease, MSI stable I told the patient CT finding is compatible with metastatic, stage IV uterine cancer I told her that her disease is now considered incurable The patient had nausea with last cycle of treatment She is keen to receive palliative chemotherapy locally here I discussed the case with hospitalist.  Once she is medically stable, we can arrange hospital transfer to [facility] for her to start chemotherapy either next week I will get IV team to assess to see whether we can use a port  History of severe coronary artery disease, multiple cardiovascular risk factors including diabetes, hypertension and smoking, with elevated serum troponin I am concerned about elevated serum troponin Echocardiogram result is pending She may not be a candidate for Herceptin if she has poor cardiac function  Right-sided hydronephrosis with mild chronic renal failure She does not need stent placement right now Continue risk factor modification and hydration  History of tobacco use The patient stated she has quit smoking for 2 weeks  Poorly controlled diabetes She will likely have significant risk and side effects with neuropathy while on treatment  Medical noncompliance The patient has been noncompliant with follow-up at another health system I told the patient that her disease is terminal and without chemotherapy inconsistent follow-up, her prognosis is very poor  Moderate protein calorie malnutrition We will get dietitian to see  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 The patient knows to call the clinic with any problems, questions or concerns. No barriers to learning was detected.  Heath Lark, MD 6/17/20231:21 PM  CHIEF COMPLAINTS/PURPOSE OF CONSULTATION:  Recurrent, metastatic uterine cancer to lung and possible to bone, high-grade serous, HER2 positive disease  HISTORY OF PRESENTING ILLNESS:  Krista [name] 61 y.o. female is seen at the request by hospitalist team The patient had background history of uterine cancer She presented to the hospital due to rib pain/rib fracture Imaging study showed evidence of metastatic disease The patient herself is a poor historian I reviewed her records extensively.  She had received her care at [facility] with surgery followed by 1 cycle of carboplatin, paclitaxel and Herceptin last year.  Her treatment was delayed due to noncompliance and difficulties with transportation.  After 1 cycle of treatment in July of last year, she failed to keep up with her appointment and was lost to follow-up  I have reviewed her chart and materials related to her cancer extensively and collaborated history with the patient. Summary of oncologic history is as follows: Oncology History Overview Note  High grade serous MSI stable, Her2/neu 3+, Er/PR neg   Endometrial cancer ([place])  11/25/2020 Procedure   Dr Myrene Galas. performed an endometrial biopsy on 11/25/20 which  showed 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    12/14/2020 Imaging   CT chest abdomen and pelvis IMPRESSION:  1.  2 small nodules in the right lung,  nonspecific.  2.  Prominent precarinal lymph node versus adjacent small nodes.  3.  Enlarged heterogeneous uterine fundus.  4.  No definite adenopathy in the abdomen or pelvis. Nonspecific small lymph nodes are present, not enlarged by CT criteria.  5.  Mild wall thickening of the anterior aspect of the urinary bladder which may be due to to nondistention or inflammation.    12/27/2020 Pathology Results   SPECIMEN     Procedure:    Total hysterectomy and bilateral salpingo-oophorectomy   TUMOR     Histologic Type:    Serous carcinoma     Histologic Grade:    Not applicable     Myometrial Invasion:    Present       Depth of Myometrial Invasion:    11 mm       Myometrial Thickness:    12 mm       Percentage of Myometrial Invasion:    92 %     Uterine Serosa Involvement:    Not identified     Cervical Stromal Involvement:    Not identified     Other Tissue / Organ Involvement:    Not identified     Peritoneal / Ascitic Fluid:    Not identified     Lymphovascular Invasion (LVI):    Present   MARGINS     Margin Status:    Not applicable   REGIONAL LYMPH NODES     Regional Lymph Node Status:           :    All regional lymph nodes negative for tumor cells         Lymph Nodes Examined:               Total Number of Pelvic Nodes Examined:    3           Number of Pelvic Sentinel Nodes Examined:    3           Total Number of Para-aortic Nodes Examined:    0           Number of Para-aortic Sentinel Nodes Examined:    Not applicable   DISTANT METASTASIS     Distant Site(s) Involved:    Not applicable   PATHOLOGIC STAGE CLASSIFICATION (pTNM, AJCC 8th Edition)     Reporting of pT, pN, and (when applicable) pM categories is based on information available to the pathologist at the time the report is issued. As per the AJCC (Chapter 1, 8th Ed.) it is the managing physician's responsibility to establish the final pathologic stage based upon all pertinent information, including but potentially  not limited to this pathology report.     TNM Descriptors:    Not applicable     Tumor Modifier:    Not applicable     pT Category:    pT1b     Regional Lymph Nodes Modifier:    (sn)     pN Category:    pN0   The endometrial adenocarcinoma was analyzed by IHC for DNA mismatch repair proteins.  The neoplasm retained nuclear expression of all four mismatch repair proteins, MLH1, PMS2, MSH2, MSH6.    Estrogen receptors (ER): Negative Progesterone receptors (PR): Negative  Controls worked appropriately.   A HER2 stain is POSITIVE (3+).  12/27/2020 Surgery   At [place], she underwent a robotic hysterectomy/BSO, sentinel node dissection and partial omentectomy. She was found to have an enlarged uterus. An enlarged right pelvic sentinel node.    02/23/2021 Echocardiogram   Left Ventricle: Systolic function is normal. EF: 55-60%.    Left Ventricle: Doppler parameters consistent with mild diastolic  dysfunction and low to normal LA pressure.    Left Ventricle: There is mild concentric hypertrophy.    Aortic Valve: Mild aortic valve regurgitation.   01/27/2022 Imaging   CT chest imaging 1. No pulmonary embolus. 2. Findings consistent with intrathoracic malignancy. Multiple pulmonary nodules throughout both lungs of varying sizes, consistent with metastatic disease. Primary site of malignancy may represent a 4 cm spiculated nodule in the right perihilar region spanning the fissure that is cavitating. Alternatively all of these nodules may be metastatic given history of cancer, type not specified.  3. Multifocal mediastinal and bilateral hilar adenopathy, consistent metastatic disease. 4. Nondisplaced left anterior seventh and eighth rib fractures, soft tissue thickening adjacent to the seventh rib fracture suggests this may be pathologic 5. Right hydronephrosis is partially included in the field of view in the upper abdomen. Small soft tissue density medial to the right hepatic lobe posterior to  the right kidney is nonspecific but may represent metastatic disease. Recommend staging CT of the abdomen and pelvis with oral and IV contrast. 6. Moderate emphysema. 7. Aortic atherosclerosis.  Coronary artery calcifications.   01/28/2022 Initial Diagnosis   Endometrial cancer ([place])   01/28/2022 Cancer Staging   Staging form: Corpus Uteri - Carcinoma and Carcinosarcoma, AJCC 8th Edition - Clinical stage from 01/28/2022: FIGO Stage IVB (rcT1b, cN2a, cM1) - Signed by Heath Lark, MD on 01/28/2022 Stage prefix: Recurrence   01/28/2022 Imaging   CT abdomen and pelvis 1. Multiple abdominal and pelvic metastatic implants as above. 2. Moderate right hydronephrosis secondary to mass effect and obstruction of the distal right ureter by a right pelvic metastatic implant. 3. Multiple pulmonary metastatic disease in the visualized right lung base. 4. No bowel obstruction. Normal appendix. 5. Aortic Atherosclerosis (ICD10-I70.0).      MEDICAL HISTORY:  Past Medical History:  Diagnosis Date   Arthritis    CAD (coronary artery disease)    Depression    Diabetes mellitus    Hypertension    NSTEMI (non-ST elevated myocardial infarction) ([place])     SURGICAL HISTORY: Past Surgical History:  Procedure Laterality Date   ANKLE SURGERY     left ankle - was broken   CORONARY STENT INTERVENTION N/A 12/16/2018   Procedure: CORONARY STENT INTERVENTION;  Surgeon: Leonie Man, MD;  Location: [facility];  Service: Cardiovascular;  Laterality: N/A;  LAD   LEFT HEART CATH AND CORONARY ANGIOGRAPHY N/A 12/16/2018   Procedure: LEFT HEART CATH AND CORONARY ANGIOGRAPHY;  Surgeon: Leonie Man, MD;  Location: [facility];  Service: Cardiovascular;  Laterality: N/A;    SOCIAL HISTORY: Social History   Socioeconomic History   Marital status: Single    Spouse name: Not on file   Number of children: Not on file   Years of education: Not on file   Highest education level: Not on file   Occupational History   Not on file  Tobacco Use   Smoking status: Every Day    Packs/day: 0.50    Years: 33.00    Total pack years: 16.50    Types: Cigarettes   Smokeless tobacco: Never  Substance and Sexual Activity  Alcohol use: No   Drug use: No   Sexual activity: Yes    Birth control/protection: Post-menopausal  Other Topics Concern   Not on file  Social History Narrative   Not on file   Social Determinants of Health   Financial Resource Strain: Not on file  Food Insecurity: Not on file  Transportation Needs: No Transportation Needs (12/13/2020)   PRAPARE - Hydrologist (Medical): No    Lack of Transportation (Non-Medical): No  Physical Activity: Not on file  Stress: Not on file  Social Connections: Not on file  Intimate Partner Violence: Not on file    FAMILY HISTORY: Family History  Problem Relation Age of Onset   Diabetes Mother    COPD Mother    Hypertension Mother    Diabetes Father    Hypertension Father     ALLERGIES:  is allergic to statins and zetia [ezetimibe].  MEDICATIONS:  Current Facility-Administered Medications  Medication Dose Route Frequency Provider Last Rate Last Admin   acetaminophen (TYLENOL) tablet 1,000 mg  1,000 mg Oral Q8H Elodia Florence., MD   1,000 mg at 01/28/22 1312   Followed by   Derrill Memo ON 01/31/2022] acetaminophen (TYLENOL) tablet 650 mg  650 mg Oral Q6H PRN Elodia Florence., MD       amLODipine (NORVASC) tablet 5 mg  5 mg Oral Daily Elodia Florence., MD   5 mg at 01/28/22 3254   benzonatate (TESSALON) capsule 100 mg  100 mg Oral TID PRN Elodia Florence., MD       enoxaparin (LOVENOX) injection 40 mg  40 mg Subcutaneous Q24H Irene Pap N, DO   40 mg at 01/28/22 0943   guaiFENesin (MUCINEX) 12 hr tablet 600 mg  600 mg Oral BID Elodia Florence., MD   600 mg at 01/28/22 0947   HYDROmorphone (DILAUDID) injection 0.5 mg  0.5 mg Intravenous Q4H PRN Irene Pap N, DO        insulin aspart (novoLOG) injection 0-5 Units  0-5 Units Subcutaneous QHS Hall, Carole N, DO       insulin aspart (novoLOG) injection 0-9 Units  0-9 Units Subcutaneous TID WC Irene Pap N, DO   2 Units at 01/28/22 1150   ketorolac (TORADOL) 15 MG/ML injection 15 mg  15 mg Intravenous Q6H Elodia Florence., MD   15 mg at 01/28/22 1150   lactated ringers infusion   Intravenous Continuous Elodia Florence., MD 100 mL/hr at 01/28/22 0943 Rate Change at 01/28/22 0943   lidocaine (LIDODERM) 5 % 2 patch  2 patch Transdermal Q24H Elodia Florence., MD   2 patch at 01/28/22 0948   melatonin tablet 5 mg  5 mg Oral QHS PRN Irene Pap N, DO       oxyCODONE (Oxy IR/ROXICODONE) immediate release tablet 5 mg  5 mg Oral Q4H PRN Elodia Florence., MD       polyethylene glycol (MIRALAX / GLYCOLAX) packet 17 g  17 g Oral Daily PRN Irene Pap N, DO       prochlorperazine (COMPAZINE) injection 10 mg  10 mg Intravenous Q6H PRN Hall, Carole N, DO       senna-docusate (Senokot-S) tablet 1 tablet  1 tablet Oral QHS Irene Pap N, DO        REVIEW OF SYSTEMS: She has some mild shortness of breath.  Occasional cough.  Her rib pain is bothering her.  All other systems were reviewed with the patient and are negative.  PHYSICAL EXAMINATION: ECOG PERFORMANCE STATUS: 1 - Symptomatic but completely ambulatory  Vitals:   01/28/22 1000 01/28/22 1100  BP: (!) 163/116 (!) 141/99  Pulse: 66 72  Resp: 20 19  Temp:  98.1 F (36.7 C)  SpO2: 94% 91%   Filed Weights   01/27/22 2137  Weight: 150 lb (68 kg)    GENERAL:alert, no distress and comfortable SKIN: skin color, texture, turgor are normal, no rashes or significant lesions EYES: normal, conjunctiva are pink and non-injected, sclera clear OROPHARYNX:no exudate, no erythema and lips, buccal mucosa, and tongue normal  NECK: supple, thyroid normal size, non-tender, without nodularity LYMPH:  no palpable lymphadenopathy in the cervical,  axillary or inguinal LUNGS: clear to auscultation and percussion with normal breathing effort HEART: regular rate & rhythm and no murmurs and no lower extremity edema ABDOMEN:abdomen soft, non-tender and normal bowel sounds Musculoskeletal:no cyanosis of digits and no clubbing.  She has tenderness on palpation in her rib cage area PSYCH: alert & oriented x 3 with fluent speech NEURO: no focal motor/sensory deficits  LABORATORY DATA:  I have reviewed the data as listed Lab Results  Component Value Date   WBC 6.9 01/28/2022   HGB 12.4 01/28/2022   HCT 36.7 01/28/2022   MCV 82.8 01/28/2022   PLT 274 01/28/2022   Recent Labs    01/27/22 1709 01/28/22 0608  NA 136 135  K 3.8 3.8  CL 103 101  CO2 24 23  GLUCOSE 202* 137*  BUN 14 11  CREATININE 1.17* 1.15*  CALCIUM 8.9 8.6*  GFRNONAA 54* 55*  PROT 7.3 7.0  ALBUMIN 3.0* 2.9*  AST 15 14*  ALT 12 11  ALKPHOS 56 53  BILITOT 0.5 0.4    RADIOGRAPHIC STUDIES: I personally reviewed her imaging study I have personally reviewed the radiological images as listed and agreed with the findings in the report. CT ABDOMEN PELVIS W CONTRAST  Result Date: 01/28/2022 CLINICAL DATA:  Evaluate for metastatic disease. EXAM: CT ABDOMEN AND PELVIS WITH CONTRAST TECHNIQUE: Multidetector CT imaging of the abdomen and pelvis was performed using the standard protocol following bolus administration of intravenous contrast. RADIATION DOSE REDUCTION: This exam was performed according to the departmental dose-optimization program which includes automated exposure control, adjustment of the mA and/or kV according to patient size and/or use of iterative reconstruction technique. CONTRAST:  135m OMNIPAQUE IOHEXOL 300 MG/ML  SOLN COMPARISON:  CT abdomen pelvis dated 09/22/2020. chest CT dated 01/27/2022. FINDINGS: Lower chest: Multiple pulmonary nodules in the visualized right lung base consistent with metastatic disease. Partially visualized right perihilar nodular  densities (1/3) corresponding to the right hilar mass or lymphadenopathy. These findings are better evaluated on the chest CT of 01/27/2022. No intra-abdominal free air.  Small free fluid in the pelvis. Hepatobiliary: Faint small nodular deposit along the liver capsule in the right lobe of the liver (12/3) measuring up to 12 mm most consistent with metastatic implant. No intrahepatic biliary ductal dilatation. The gallbladder is unremarkable. Pancreas: The pancreas is unremarkable. No active inflammatory changes. No dilatation of the main pancreatic duct. Spleen: The spleen is unremarkable. Adrenals/Urinary Tract: The adrenal glands are unremarkable. There is moderate right hydronephrosis and mild right hydroureter secondary to compression and obstruction of the distal right ureter by a metastatic lesion in the right hemipelvis. The left kidney is unremarkable. The urinary bladder is minimally distended and grossly unremarkable. Stomach/Bowel: There is no bowel obstruction or active inflammation.  The appendix is normal. Vascular/Lymphatic: Moderate aortoiliac atherosclerotic disease. The IVC is unremarkable. No portal venous gas. No retroperitoneal adenopathy. Reproductive: Hysterectomy. Other: Several pelvic masses consistent with metastatic disease. The masses demonstrate central low attenuation, likely necrotic tissue. The largest measuring 4.4 x 3.5 x 4.7 cm in the right hemipelvis. This mass causes compression of the distal right ureter with associated right-sided hydronephrosis. Additional metastatic implants in the omentum and in the left upper abdomen measure up to 4.3 x 3.3 cm in axial dimensions (42/3). There is a 4.5 x 2.3 cm metastatic implant in the right lateral abdominal wall musculature (45/3). Faint 1.4 x 1.0 cm nodular density over the left diaphragmatic crus (22/3) suspicious for metastatic implant. Musculoskeletal: There is osteopenia with degenerative changes of the spine. No acute osseous  pathology. No suspicious bone lesions. IMPRESSION: 1. Multiple abdominal and pelvic metastatic implants as above. 2. Moderate right hydronephrosis secondary to mass effect and obstruction of the distal right ureter by a right pelvic metastatic implant. 3. Multiple pulmonary metastatic disease in the visualized right lung base. 4. No bowel obstruction. Normal appendix. 5. Aortic Atherosclerosis (ICD10-I70.0). Electronically Signed   By: Anner Crete M.D.   On: 01/28/2022 03:41   CT Angio Chest PE W and/or Wo Contrast  Result Date: 01/27/2022 CLINICAL DATA:  Pulmonary embolism (PE) suspected, high prob History of cancer, pleuritic left-sided chest pain, short of breath. Abnormality on chest x-ray CT recommended. Rule out PE versus cancer versus pneumonia versus soft tissue injury EXAM: CT ANGIOGRAPHY CHEST WITH CONTRAST TECHNIQUE: Multidetector CT imaging of the chest was performed using the standard protocol during bolus administration of intravenous contrast. Multiplanar CT image reconstructions and MIPs were obtained to evaluate the vascular anatomy. RADIATION DOSE REDUCTION: This exam was performed according to the departmental dose-optimization program which includes automated exposure control, adjustment of the mA and/or kV according to patient size and/or use of iterative reconstruction technique. CONTRAST:  2m OMNIPAQUE IOHEXOL 350 MG/ML SOLN COMPARISON:  Chest radiograph earlier today. No prior chest CT FINDINGS: Cardiovascular: There are no filling defects within the pulmonary arteries to suggest pulmonary embolus. Mild aortic atherosclerosis. The heart is normal in size. There are coronary artery calcifications. No pericardial effusion. Mediastinum/Nodes: There is multifocal mediastinal adenopathy. Anterior paratracheal node versus nodal conglomerate measuring 2 cm short axis series 5, image 45. Subcarinal node versus nodal conglomerate measuring 2.8 cm series 5, image 56. There are multiple  enlarged right hilar nodes measuring from 14 to 19 mm. There are enlarged left hilar nodes measuring up to 15 mm. There is a 14 mm right epicardial node. The esophagus is decompressed. Lungs/Pleura: Moderate emphysema and bronchial thickening. The right lobar bronchi are attenuated due to adjacent adenopathy. There are multiple pulmonary nodules throughout both lungs of varying sizes. Right perihilar spiculated nodule spanning the fissure measures 4 x 2.3 x 3.6 cm and is cavitating series 6, image 47, series 8, image 77. 2.5 cm left upper lobe nodule corresponds to abnormality on CT. Majority of the additional nodules range from 5-15 mm. Fissural thickening in the left lung. No features of pulmonary edema. There is no endobronchial lesion. No pleural effusion. Upper Abdomen: Limited assessment for focal hepatic lesion given phase of contrast. There is a small soft tissue density medial to the right hepatic lobe posterior to the right kidney series 5, image 127. Right hydronephrosis is partially included in the field of view. Lack of upper abdominal bowel fat limits detailed assessment. Musculoskeletal: Nondisplaced left anterior seventh and eighth  rib fractures. There is some soft tissue thickening adjacent to the seventh rib fracture suggesting this may be pathologic. No chest wall soft tissue abnormalities. Review of the MIP images confirms the above findings. IMPRESSION: 1. No pulmonary embolus. 2. Findings consistent with intrathoracic malignancy. Multiple pulmonary nodules throughout both lungs of varying sizes, consistent with metastatic disease. Primary site of malignancy may represent a 4 cm spiculated nodule in the right perihilar region spanning the fissure that is cavitating. Alternatively all of these nodules may be metastatic given history of cancer, type not specified. 3. Multifocal mediastinal and bilateral hilar adenopathy, consistent metastatic disease. 4. Nondisplaced left anterior seventh and  eighth rib fractures, soft tissue thickening adjacent to the seventh rib fracture suggests this may be pathologic 5. Right hydronephrosis is partially included in the field of view in the upper abdomen. Small soft tissue density medial to the right hepatic lobe posterior to the right kidney is nonspecific but may represent metastatic disease. Recommend staging CT of the abdomen and pelvis with oral and IV contrast. 6. Moderate emphysema. 7. Aortic atherosclerosis.  Coronary artery calcifications. Aortic Atherosclerosis (ICD10-I70.0) and Emphysema (ICD10-J43.9). Electronically Signed   By: Keith Rake M.D.   On: 01/27/2022 23:03   DG Chest 2 View  Result Date: 01/27/2022 CLINICAL DATA:  Shortness of breath, cough EXAM: CHEST - 2 VIEW COMPARISON:  Previous studies including the examination of 05/14/2019 FINDINGS: Cardiac size is within normal limits. Thoracic aorta is tortuous. Right hilum appears more prominent. There is 2.8 cm smooth marginated nodule in the left upper lung fields adjacent to the left hilum.Rest of the lung fields are essentially clear. There is no pleural effusion or pneumothorax. IMPRESSION: There is 2.8 cm nodular density in the left parahilar region. Possibility of malignant neoplasm is not excluded. Follow-up CT chest may be considered. Right hilum appears more prominent. This may be due to prominent central pulmonary vessels or lymphadenopathy. Electronically Signed   By: Elmer Picker M.D.   On: 01/27/2022 17:45

## 2022-01-28 NOTE — Assessment & Plan Note (Addendum)
Presume this is related to hx endometrial cancer? CT chest notes 4 cm spiculated nodule in R perihilatr region that is cavitating -> could be primary site? CT chest with multiple pulm nodules c/w metastatic disease, multifocal medistinal and bilateral hilar adenopathy, c/w metastatic disease CT abd/pelvis with multiple abdominal and pelvic implants as well  Per oncology

## 2022-01-28 NOTE — H&P (Addendum)
History and Physical  Krista [name] ZDG:[id number] DOB: 08/15/1960 DOA: 01/27/2022  Referring physician: Dr. Laverta Baltimore, EDP  PCP: Drue Flirt, MD  Outpatient Specialists: Gynecology, medical oncology. Patient coming from: Home.  Chief Complaint: Chest pain and cough  HPI: Krista [name] is a 61 y.o. female with medical history significant for endometrial carcinoma, coronary artery disease, prior MI, type 2 diabetes, hyperlipidemia, hypertension, chronic anxiety/depression, former tobacco use disorder quit 2 weeks ago, who presented to [facility] ED from home due to left-sided chest pain after falling on her left side 1 week ago.  Associated with 1 episode of hemoptysis 3 weeks ago and a productive cough of yellow sputum.    CTA chest ruled out PE however revealed findings consistent with intrathoracic malignancy, multiple pulmonary nodules throughout both lungs of varying sizes consistent with metastatic disease.  Nondisplaced left anterior seventh and eighth rib fractures, soft tissue thickening adjacent to the seventh rib fracture suggest this may be pathologic.  Small soft tissue density medial to the right hepatic lobe posterior to the right kidney is nonspecific but may represent metastatic disease.  Moderate emphysema.  Coronary artery calcifications.  Aortic atherosclerosis.    Contrast CT abdomen and pelvis revealed multiple abdominal and pelvic metastatic implants.  Moderate right hydronephrosis secondary to mass effect and obstruction of the distal right ureter with a right pelvic metastatic implant.  No bowel obstruction.    The patient received IV fentanyl in the ED without improvement of her pain, 10 out of 10 in severity.  EDP requests admission for pain control and for oncology/palliative care involvement.  The patient was admitted by the hospitalist service, [facility].  ED Course: Tmax 98.3.  BP 152/102, pulse 106, respiratory 24, saturation 96% on room air.  Lab studies remarkable for  serum glucose 2 2, creatinine 1.17 with GFR of 64 from baseline creatinine 0.8.  Troponin 301, repeat 277.  CBC is essentially unremarkable.  Review of Systems: Review of systems as noted in the HPI. All other systems reviewed and are negative.   Past Medical History:  Diagnosis Date   Arthritis    CAD (coronary artery disease)    Depression    Diabetes mellitus    Hypertension    NSTEMI (non-ST elevated myocardial infarction) [facility])    Past Surgical History:  Procedure Laterality Date   ANKLE SURGERY     left ankle - was broken   CORONARY STENT INTERVENTION N/A 12/16/2018   Procedure: CORONARY STENT INTERVENTION;  Surgeon: Leonie Man, MD;  Location: [facility];  Service: Cardiovascular;  Laterality: N/A;  LAD   LEFT HEART CATH AND CORONARY ANGIOGRAPHY N/A 12/16/2018   Procedure: LEFT HEART CATH AND CORONARY ANGIOGRAPHY;  Surgeon: Leonie Man, MD;  Location: [facility];  Service: Cardiovascular;  Laterality: N/A;    Social History:  reports that she has been smoking cigarettes. She has a 16.50 pack-year smoking history. She has never used smokeless tobacco. She reports that she does not drink alcohol and does not use drugs.   Allergies  Allergen Reactions   Statins     itching   Zetia [Ezetimibe] Itching    Family History  Problem Relation Age of Onset   Diabetes Mother    COPD Mother    Hypertension Mother    Diabetes Father    Hypertension Father       Prior to Admission medications   Medication Sig Start Date End Date Taking? Authorizing Provider  ACCU-CHEK GUIDE  test strip  10/11/20   [provider]  acetaminophen (TYLENOL) 500 MG tablet Take 1,000 mg by mouth every 6 (six) hours as needed for mild pain or headache.    [provider]  aspirin EC 81 MG EC tablet Take 1 tablet (81 mg total) by mouth daily. 05/17/19   Myles Gip, DO  clopidogrel (PLAVIX) 75 MG tablet Take 1 tablet (75 mg total) by mouth daily. 09/05/19    Lorretta Harp, MD  diltiazem (DILACOR XR) 180 MG 24 hr capsule Take 2 capsules (360 mg total) by mouth daily. 04/09/20   Almyra Deforest, PA  doxycycline (VIBRAMYCIN) 100 MG capsule Take 1 capsule (100 mg total) by mouth 2 (two) times daily. One po bid x 7 days 09/23/20   Domenic Moras, PA-C  doxycycline (VIBRAMYCIN) 100 MG capsule Take 1 capsule (100 mg total) by mouth 2 (two) times daily. One po bid x 7 days 09/23/20   Domenic Moras, PA-C  ergocalciferol (VITAMIN D2) 1.25 MG (50000 UT) capsule Take 50,000 Units by mouth once a week.    [provider]  Evolocumab (REPATHA SURECLICK) 630 MG/ML SOAJ Inject 140 mg into the skin every 14 (fourteen) days. 11/10/19   Lorretta Harp, MD  loratadine (CLARITIN) 10 MG tablet Take 1 tablet (10 mg total) by mouth daily as needed for allergies. 10/28/19   Lorretta Harp, MD  metFORMIN (GLUCOPHAGE) 1000 MG tablet Take 1,000 mg by mouth 2 (two) times daily with a meal. 04/29/19   [provider]  nitroGLYCERIN (NITROSTAT) 0.4 MG SL tablet Place 1 tablet (0.4 mg total) under the tongue every 5 (five) minutes as needed for chest pain. 10/28/19   Lorretta Harp, MD  TRULICITY 1.60 FU/9.3AT SOPN Inject into the skin. 10/11/20   [provider]    Physical Exam: BP (!) 152/102   Pulse (!) 106   Temp 98.3 F (36.8 C) (Oral)   Resp (!) 24   Ht '5\' 3"'$  (1.6 m)   Wt 68 kg   SpO2 96%   BMI 26.57 kg/m   General: 61 y.o. year-old female well developed well nourished in no acute distress.  Alert and oriented x3. Cardiovascular: Regular rate and rhythm with no rubs or gallops.  No thyromegaly or JVD noted.  No lower extremity edema. 2/4 pulses in all 4 extremities. Respiratory: Clear to auscultation with no wheezes or rales. Good inspiratory effort. Abdomen: Soft nontender nondistended with normal bowel sounds x4 quadrants. Muskuloskeletal: No cyanosis, clubbing or edema noted bilaterally Neuro: CN II-XII intact, strength, sensation,  reflexes Skin: No ulcerative lesions noted or rashes Psychiatry: Judgement and insight appear normal. Mood is appropriate for condition and setting          Labs on Admission:  Basic Metabolic Panel: Recent Labs  Lab 01/27/22 1709  NA 136  K 3.8  CL 103  CO2 24  GLUCOSE 202*  BUN 14  CREATININE 1.17*  CALCIUM 8.9   Liver Function Tests: Recent Labs  Lab 01/27/22 1709  AST 15  ALT 12  ALKPHOS 56  BILITOT 0.5  PROT 7.3  ALBUMIN 3.0*   Recent Labs  Lab 01/27/22 2133  LIPASE 56*   No results for input(s): "AMMONIA" in the last 168 hours. CBC: Recent Labs  Lab 01/27/22 1709  WBC 7.3  NEUTROABS 4.0  HGB 12.7  HCT 39.7  MCV 85.4  PLT 323   Cardiac Enzymes: No results for input(s): "CKTOTAL", "CKMB", "CKMBINDEX", "TROPONINI" in the  last 168 hours.  BNP (last 3 results) No results for input(s): "BNP" in the last 8760 hours.  ProBNP (last 3 results) No results for input(s): "PROBNP" in the last 8760 hours.  CBG: No results for input(s): "GLUCAP" in the last 168 hours.  Radiological Exams on Admission: CT ABDOMEN PELVIS W CONTRAST  Result Date: 01/28/2022 CLINICAL DATA:  Evaluate for metastatic disease. EXAM: CT ABDOMEN AND PELVIS WITH CONTRAST TECHNIQUE: Multidetector CT imaging of the abdomen and pelvis was performed using the standard protocol following bolus administration of intravenous contrast. RADIATION DOSE REDUCTION: This exam was performed according to the departmental dose-optimization program which includes automated exposure control, adjustment of the mA and/or kV according to patient size and/or use of iterative reconstruction technique. CONTRAST:  148m OMNIPAQUE IOHEXOL 300 MG/ML  SOLN COMPARISON:  CT abdomen pelvis dated 09/22/2020. chest CT dated 01/27/2022. FINDINGS: Lower chest: Multiple pulmonary nodules in the visualized right lung base consistent with metastatic disease. Partially visualized right perihilar nodular densities (1/3)  corresponding to the right hilar mass or lymphadenopathy. These findings are better evaluated on the chest CT of 01/27/2022. No intra-abdominal free air.  Small free fluid in the pelvis. Hepatobiliary: Faint small nodular deposit along the liver capsule in the right lobe of the liver (12/3) measuring up to 12 mm most consistent with metastatic implant. No intrahepatic biliary ductal dilatation. The gallbladder is unremarkable. Pancreas: The pancreas is unremarkable. No active inflammatory changes. No dilatation of the main pancreatic duct. Spleen: The spleen is unremarkable. Adrenals/Urinary Tract: The adrenal glands are unremarkable. There is moderate right hydronephrosis and mild right hydroureter secondary to compression and obstruction of the distal right ureter by a metastatic lesion in the right hemipelvis. The left kidney is unremarkable. The urinary bladder is minimally distended and grossly unremarkable. Stomach/Bowel: There is no bowel obstruction or active inflammation. The appendix is normal. Vascular/Lymphatic: Moderate aortoiliac atherosclerotic disease. The IVC is unremarkable. No portal venous gas. No retroperitoneal adenopathy. Reproductive: Hysterectomy. Other: Several pelvic masses consistent with metastatic disease. The masses demonstrate central low attenuation, likely necrotic tissue. The largest measuring 4.4 x 3.5 x 4.7 cm in the right hemipelvis. This mass causes compression of the distal right ureter with associated right-sided hydronephrosis. Additional metastatic implants in the omentum and in the left upper abdomen measure up to 4.3 x 3.3 cm in axial dimensions (42/3). There is a 4.5 x 2.3 cm metastatic implant in the right lateral abdominal wall musculature (45/3). Faint 1.4 x 1.0 cm nodular density over the left diaphragmatic crus (22/3) suspicious for metastatic implant. Musculoskeletal: There is osteopenia with degenerative changes of the spine. No acute osseous pathology. No  suspicious bone lesions. IMPRESSION: 1. Multiple abdominal and pelvic metastatic implants as above. 2. Moderate right hydronephrosis secondary to mass effect and obstruction of the distal right ureter by a right pelvic metastatic implant. 3. Multiple pulmonary metastatic disease in the visualized right lung base. 4. No bowel obstruction. Normal appendix. 5. Aortic Atherosclerosis (ICD10-I70.0). Electronically Signed   By: AAnner CreteM.D.   On: 01/28/2022 03:41   CT Angio Chest PE W and/or Wo Contrast  Result Date: 01/27/2022 CLINICAL DATA:  Pulmonary embolism (PE) suspected, high prob History of cancer, pleuritic left-sided chest pain, short of breath. Abnormality on chest x-ray CT recommended. Rule out PE versus cancer versus pneumonia versus soft tissue injury EXAM: CT ANGIOGRAPHY CHEST WITH CONTRAST TECHNIQUE: Multidetector CT imaging of the chest was performed using the standard protocol during bolus administration of intravenous contrast. Multiplanar CT  image reconstructions and MIPs were obtained to evaluate the vascular anatomy. RADIATION DOSE REDUCTION: This exam was performed according to the departmental dose-optimization program which includes automated exposure control, adjustment of the mA and/or kV according to patient size and/or use of iterative reconstruction technique. CONTRAST:  52m OMNIPAQUE IOHEXOL 350 MG/ML SOLN COMPARISON:  Chest radiograph earlier today. No prior chest CT FINDINGS: Cardiovascular: There are no filling defects within the pulmonary arteries to suggest pulmonary embolus. Mild aortic atherosclerosis. The heart is normal in size. There are coronary artery calcifications. No pericardial effusion. Mediastinum/Nodes: There is multifocal mediastinal adenopathy. Anterior paratracheal node versus nodal conglomerate measuring 2 cm short axis series 5, image 45. Subcarinal node versus nodal conglomerate measuring 2.8 cm series 5, image 56. There are multiple enlarged right  hilar nodes measuring from 14 to 19 mm. There are enlarged left hilar nodes measuring up to 15 mm. There is a 14 mm right epicardial node. The esophagus is decompressed. Lungs/Pleura: Moderate emphysema and bronchial thickening. The right lobar bronchi are attenuated due to adjacent adenopathy. There are multiple pulmonary nodules throughout both lungs of varying sizes. Right perihilar spiculated nodule spanning the fissure measures 4 x 2.3 x 3.6 cm and is cavitating series 6, image 47, series 8, image 77. 2.5 cm left upper lobe nodule corresponds to abnormality on CT. Majority of the additional nodules range from 5-15 mm. Fissural thickening in the left lung. No features of pulmonary edema. There is no endobronchial lesion. No pleural effusion. Upper Abdomen: Limited assessment for focal hepatic lesion given phase of contrast. There is a small soft tissue density medial to the right hepatic lobe posterior to the right kidney series 5, image 127. Right hydronephrosis is partially included in the field of view. Lack of upper abdominal bowel fat limits detailed assessment. Musculoskeletal: Nondisplaced left anterior seventh and eighth rib fractures. There is some soft tissue thickening adjacent to the seventh rib fracture suggesting this may be pathologic. No chest wall soft tissue abnormalities. Review of the MIP images confirms the above findings. IMPRESSION: 1. No pulmonary embolus. 2. Findings consistent with intrathoracic malignancy. Multiple pulmonary nodules throughout both lungs of varying sizes, consistent with metastatic disease. Primary site of malignancy may represent a 4 cm spiculated nodule in the right perihilar region spanning the fissure that is cavitating. Alternatively all of these nodules may be metastatic given history of cancer, type not specified. 3. Multifocal mediastinal and bilateral hilar adenopathy, consistent metastatic disease. 4. Nondisplaced left anterior seventh and eighth rib  fractures, soft tissue thickening adjacent to the seventh rib fracture suggests this may be pathologic 5. Right hydronephrosis is partially included in the field of view in the upper abdomen. Small soft tissue density medial to the right hepatic lobe posterior to the right kidney is nonspecific but may represent metastatic disease. Recommend staging CT of the abdomen and pelvis with oral and IV contrast. 6. Moderate emphysema. 7. Aortic atherosclerosis.  Coronary artery calcifications. Aortic Atherosclerosis (ICD10-I70.0) and Emphysema (ICD10-J43.9). Electronically Signed   By: MKeith RakeM.D.   On: 01/27/2022 23:03   DG Chest 2 View  Result Date: 01/27/2022 CLINICAL DATA:  Shortness of breath, cough EXAM: CHEST - 2 VIEW COMPARISON:  Previous studies including the examination of 05/14/2019 FINDINGS: Cardiac size is within normal limits. Thoracic aorta is tortuous. Right hilum appears more prominent. There is 2.8 cm smooth marginated nodule in the left upper lung fields adjacent to the left hilum.Rest of the lung fields are essentially clear. There is no  pleural effusion or pneumothorax. IMPRESSION: There is 2.8 cm nodular density in the left parahilar region. Possibility of malignant neoplasm is not excluded. Follow-up CT chest may be considered. Right hilum appears more prominent. This may be due to prominent central pulmonary vessels or lymphadenopathy. Electronically Signed   By: Elmer Picker M.D.   On: 01/27/2022 17:45    EKG: I independently viewed the EKG done and my findings are as followed: Normal sinus rhythm rate of 95.  Nonspecific ST-T changes.  QTc 454.  Assessment/Plan Present on Admission:  Pain  Principal Problem:   Pain  Diffuse pain likely due to metastatic disease Rib fractures post mechanical fall As needed analgesics and bowel regimen to avoid constipation Goals of care discussion The patient has requested treatment and oncology involvement  Elevated troponin,  suspect demand ischemia in the setting of intractable cough, malignancy High-sensitivity troponin peaked at 301 and down trended No evidence of acute ischemia on 12 lead EKG Monitor on telemetry Obtain 2D echo to rule out any acute cardiac structural abnormalities  Progressive metastatic disease with history of endometrial carcinoma CT angio chest and contrast CT abdomen pelvis findings as stated above. The patient follows with medical oncology at [facility].  She states she has no intention to return to [facility] due to driving distance.  She requests [facility] oncology involvement.  Dr. Alvy Bimler added to treatment team. Please consult medical oncology in the morning  Intractable cough likely secondary to lung mets Symptom management Antitussives  AKI, suspect prerenal in the setting of poor oral intake, dehydration Baseline creatinine appears to be 0.88 with GFR of greater than 60 Presented with creatinine of 1.17 with GFR of 54 Avoid nephrotoxic agents, dehydration and hypotension Gentle IV fluid hydration LR 50 cc/h x 1 day Monitor urine output with strict I's and O's. Repeat chemistry panel in the morning  Moderate protein calorie malnutrition BMI 26 Moderate muscle mass loss Patient requests that her diet is liberalized Started on regular diet Subcu insulin for hyperglycemia  Type 2 diabetes with hyperglycemia Serum glucose 202 Obtain hemoglobin A1c Start insulin sliding scale  Former tobacco user Quit tobacco use 2 weeks ago. Encouraged to continue to abstain from tobacco use  Goals of care Palliative care team consulted to assist with establishing goals of care Full code   Critical care time: 65 minutes.   DVT prophylaxis: Subcu Lovenox daily  Code Status: Full code  Family Communication: None at bedside  Disposition Plan: Admitted to progressive unit  Consults called: Palliative care team  Admission status: Inpatient status.   Status is:  Inpatient The patient requires at least 2 midnights for further evaluation and treatment of present condition.   Kayleen Memos MD [facility] Pager [phone]  If 7PM-7AM, please contact night-coverage [email or web address] Password [email or web address]  01/28/2022, 5:25 AM

## 2022-01-28 NOTE — Hospital Course (Addendum)
Krista [name] is Krista [name] 61 y.o. female with medical history significant for endometrial carcinoma, coronary artery disease, prior MI, type 2 diabetes, hyperlipidemia, hypertension, chronic anxiety/depression, former tobacco use disorder quit 2 weeks ago, who presented to [facility] ED from home due to left-sided chest pain after falling on her left side 1 week ago.  Associated with 1 episode of hemoptysis 3 weeks ago and Krista [name] productive cough of yellow sputum.    She's been admitted for pain control related to rib fractures and her metastatic disease.  Imaging shows multiple pulm nodules, multifocal mediastinal and bilateral hilar adenopathy c/w metastatic disease and multiple abdominal and pelvic implants.  Oncology planning for chemotherapy pending cardiac evaluation.  See below for additional details.

## 2022-01-28 NOTE — Assessment & Plan Note (Signed)
- 

## 2022-01-28 NOTE — Assessment & Plan Note (Signed)
Symptomatic management Related to pulm metastatic disease

## 2022-01-28 NOTE — Assessment & Plan Note (Signed)
Quit 2 weeks ago

## 2022-01-28 NOTE — Consult Note (Signed)
Consultation Note Date: 01/28/2022   Patient Name: Krista [name]  DOB: 05/05/1961  MRN: [id number]  Age / Sex: 61 y.o., female  PCP: Drue Flirt, MD Referring Physician: Elodia Florence., *  Reason for Consultation: Establishing goals of care  HPI/Patient Profile: 61 y.o. female  with past medical history of endometrial carcinoma, coronary artery disease, prior MI, type 2 diabetes, hyperlipidemia, hypertension, chronic anxiety/depression, and former tobacco use disorder quit 2 weeks ago admitted on 01/27/2022 with left-sided chest pain after falling on her left side 1 week ago associated with 1 episode of hemoptysis 3 weeks ago and a productive cough of yellow sputum. Found to have rib fractures on left side. CT chest notes 4 cm spiculated nodule in R perihilar region that is cavitating, multiple pulm nodules, multifocal medistinal and bilateral hilar adenopathy. CT abd/pelvis with multiple abdominal and pelvic implants as well. Patient has discussed with oncology her metastatic cancer and has opted to pursue chemotherapy. PMT consulted to discuss goals of care.   Clinical Assessment and Goals of Care: I have reviewed medical records including EPIC notes, labs and imaging, received report from RN, assessed the patient and then met with patient  to discuss diagnosis prognosis, GOC, EOL wishes, disposition and options.  I introduced Palliative Medicine as specialized medical care for people living with serious illness. It focuses on providing relief from the symptoms and stress of a serious illness. The goal is to improve quality of life for both the patient and the family.  We discussed a brief life review of the patient. Patient shares she is very close with her mother, lives with her. She also tells me she has a sister in [place] - they talk on the phone daily. She tells me her father passed away 2 years ago. She tells me she has not  been working d/t her MI and cancer - she has been on disability. 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   She tells me she feels that her symptoms are improving since her admission. Her pain is better controlled - only pain is in L ribs. Feels that current regimen is helpful. She denies n/v. Denies shortness of breath. She tells me her cough is improving and her appetite is improving. Tells me she is sleeping well. Tells me she had a BM yesterday - we discussed the probable need for meds to promote BMs d/t constipation induced by opioids. She expresses understanding.   Patient tells me about her cancer diagnosis last year. She tells me she attempted chemo but it made her so sick and so she never followed up.    We discussed patient's current illness and what it means in the larger context of patient's on-going co-morbidities.   I attempted to elicit values and goals of care important to the patient.  She tells me she wants to live as long as she can.   The difference between aggressive medical intervention and comfort care was considered in light of the patient's goals of care. She tells me she is motivated to pursue cancer treatment.   We discuss code status - described full code vs DNR and patient tells me DNR is more in line with her goals - she would prefer to be allowed a natural death instead of undergoing resuscitative efforts.   Discussed with patient the importance of continued conversation with family and the medical providers regarding overall plan of care and treatment options, ensuring decisions  are within the context of the patient's values and GOCs.    Patient tells me if she were ever unable to make decisions for  herself she would want her mother who is also her next of kin to make decisions for her.   Offered chaplain support - she is agreeable.   Questions and concerns were addressed. The family was encouraged to call with questions or  concerns.   Primary Decision Maker PATIENT Mother/next of kin if patient unable  SUMMARY OF RECOMMENDATIONS   - code status change to DNR - patient motivated to pursue cancer treatment, agreeable to chemo - pain well controlled with current regimen: scheduled tylenol, prn oxy, prn dilaudid, lidocaine patch - continue - continue qhs senna - will refer to OP palliative in cancer center - spiritual care consult  Code Status/Advance Care Planning: DNR     Primary Diagnoses: Present on Admission:  TOBACCO ABUSE   I have reviewed the medical record, interviewed the patient and family, and examined the patient. The following aspects are pertinent.  Past Medical History:  Diagnosis Date   Arthritis    CAD (coronary artery disease)    Depression    Diabetes mellitus    Hypertension    NSTEMI (non-ST elevated myocardial infarction) ([place])    Social History   Socioeconomic History   Marital status: Single    Spouse name: Not on file   Number of children: Not on file   Years of education: Not on file   Highest education level: Not on file  Occupational History   Not on file  Tobacco Use   Smoking status: Every Day    Packs/day: 0.50    Years: 33.00    Total pack years: 16.50    Types: Cigarettes   Smokeless tobacco: Never  Substance and Sexual Activity   Alcohol use: No   Drug use: No   Sexual activity: Yes    Birth control/protection: Post-menopausal  Other Topics Concern   Not on file  Social History Narrative   Not on file   Social Determinants of Health   Financial Resource Strain: Not on file  Food Insecurity: Not on file  Transportation Needs: No Transportation Needs (12/13/2020)   PRAPARE - Hydrologist (Medical): No    Lack of Transportation (Non-Medical): No  Physical Activity: Not on file  Stress: Not on file  Social Connections: Not on file   Family History  Problem Relation Age of Onset   Diabetes Mother    COPD  Mother    Hypertension Mother    Diabetes Father    Hypertension Father    Scheduled Meds:  acetaminophen  1,000 mg Oral Q8H   amLODipine  5 mg Oral Daily   enoxaparin (LOVENOX) injection  40 mg Subcutaneous Q24H   guaiFENesin  600 mg Oral BID   insulin aspart  0-5 Units Subcutaneous QHS   insulin aspart  0-9 Units Subcutaneous TID WC   ketorolac  15 mg Intravenous Q6H   lidocaine  2 patch Transdermal Q24H   senna-docusate  1 tablet Oral QHS   Continuous Infusions:  lactated ringers 100 mL/hr at 01/28/22 0943   PRN Meds:.acetaminophen **FOLLOWED BY** [START ON 01/31/2022] acetaminophen, benzonatate, HYDROmorphone (DILAUDID) injection, melatonin, oxyCODONE, polyethylene glycol, prochlorperazine Allergies  Allergen Reactions   Statins     itching   Zetia [Ezetimibe] Itching   Review of Systems  Constitutional:  Positive for appetite change and fatigue. Negative for activity change.  Respiratory:  Negative for shortness of breath.   Musculoskeletal:        Pain in L ribs    Physical Exam Constitutional:      General: She is not in acute distress. Pulmonary:     Effort: Pulmonary effort is normal.  Musculoskeletal:     Right lower leg: No edema.     Left lower leg: No edema.  Skin:    General: Skin is warm and dry.  Neurological:     Mental Status: She is alert and oriented to person, place, and time.  Psychiatric:     Comments: Some tearfulness      Vital Signs: BP (!) 141/99 (BP Location: Right Arm)   Pulse 72   Temp 98.1 F (36.7 C) (Oral)   Resp 19   Ht _0  (1.6 m)   Wt 68 kg   SpO2 91%   BMI 26.57 kg/m  Pain Scale: 0-10   Pain Score: Asleep   SpO2: SpO2: 91 % O2 Device:SpO2: 91 % O2 Flow Rate: .   IO: Intake/output summary:  Intake/Output Summary (Last 24 hours) at 01/28/2022 1459 Last data filed at 01/28/2022 0950 Gross per 24 hour  Intake 240 ml  Output 350 ml  Net -110 ml    LBM: Last BM Date : 01/27/22 Baseline Weight: Weight: 68  kg Most recent weight: Weight: 68 kg     Palliative Assessment/Data: PPS 60%     *Please note that this is a verbal dictation therefore any spelling or grammatical errors are due to the "[place] [employer]" system interpretation.   Juel Burrow, DNP, AGNP-C Palliative Medicine Team [phone] Pager: [phone]

## 2022-01-28 NOTE — Assessment & Plan Note (Signed)
Related to mechanical fall and metastasis (appeared pathologic) Pain control, pulm toilet

## 2022-01-28 NOTE — Progress Notes (Signed)
No return call from Dr. Serafina Royals at this time.

## 2022-01-28 NOTE — Assessment & Plan Note (Addendum)
Unclear recent baseline Mild uptrend, follow closely with right hydro Hold further toradol Follow with IVF

## 2022-01-28 NOTE — Assessment & Plan Note (Signed)
Related to metastatic disease Discussed with urology on call, given no R sided flank pain, creatinine not significantly elevated, can pursue outpatient follow up with [facility]

## 2022-01-28 NOTE — Assessment & Plan Note (Addendum)
LHC in 12/2018 with severe CAD s/p DES PCI to LAD lesion No chest pain other than L sided rib pain related to fx downtrending Similar troponin elevation in 05/2019, at that time, thought demand (peaked to 501) EKG without acute findings (mild inferior ST depression? Appears chronic) Follow echo (pending today) Will consult cardiology with plan for chemotherapy, significant hx CAD She's not taking aspirin/plavix at this time.  Allergy listed to statins, itching? Will resume aspirin A1c, lipids

## 2022-01-29 ENCOUNTER — Other Ambulatory Visit (HOSPITAL_COMMUNITY): Payer: Medicaid Other

## 2022-01-29 ENCOUNTER — Inpatient Hospital Stay (HOSPITAL_COMMUNITY): Payer: Medicare Other

## 2022-01-29 ENCOUNTER — Encounter (HOSPITAL_COMMUNITY): Payer: Self-pay | Admitting: Internal Medicine

## 2022-01-29 DIAGNOSIS — I10 Essential (primary) hypertension: Secondary | ICD-10-CM | POA: Diagnosis not present

## 2022-01-29 DIAGNOSIS — G893 Neoplasm related pain (acute) (chronic): Secondary | ICD-10-CM | POA: Diagnosis not present

## 2022-01-29 DIAGNOSIS — I2510 Atherosclerotic heart disease of native coronary artery without angina pectoris: Secondary | ICD-10-CM

## 2022-01-29 DIAGNOSIS — R778 Other specified abnormalities of plasma proteins: Secondary | ICD-10-CM | POA: Diagnosis not present

## 2022-01-29 DIAGNOSIS — R52 Pain, unspecified: Secondary | ICD-10-CM | POA: Diagnosis not present

## 2022-01-29 LAB — ECHOCARDIOGRAM COMPLETE
Area-P 1/2: 2 cm2
Calc EF: 59.4 %
Height: 63 in
S' Lateral: 2.5 cm
Single Plane A2C EF: 60.9 %
Single Plane A4C EF: 56.6 %
Weight: 2400 oz

## 2022-01-29 LAB — COMPREHENSIVE METABOLIC PANEL
ALT: 14 U/L (ref 0–44)
AST: 16 U/L (ref 15–41)
Albumin: 2.7 g/dL — ABNORMAL LOW (ref 3.5–5.0)
Alkaline Phosphatase: 52 U/L (ref 38–126)
Anion gap: 10 (ref 5–15)
BUN: 18 mg/dL (ref 6–20)
CO2: 24 mmol/L (ref 22–32)
Calcium: 8.9 mg/dL (ref 8.9–10.3)
Chloride: 104 mmol/L (ref 98–111)
Creatinine, Ser: 1.24 mg/dL — ABNORMAL HIGH (ref 0.44–1.00)
GFR, Estimated: 50 mL/min — ABNORMAL LOW (ref >60)
Glucose, Bld: 108 mg/dL — ABNORMAL HIGH (ref 70–99)
Potassium: 4.3 mmol/L (ref 3.5–5.1)
Sodium: 138 mmol/L (ref 135–145)
Total Bilirubin: 0.4 mg/dL (ref 0.3–1.2)
Total Protein: 6.6 g/dL (ref 6.5–8.1)

## 2022-01-29 LAB — CBC WITH DIFFERENTIAL/PLATELET
Abs Immature Granulocytes: 0.01 10*3/uL (ref 0.00–0.07)
Basophils Absolute: 0 10*3/uL (ref 0.0–0.1)
Basophils Relative: 0 %
Eosinophils Absolute: 0.2 10*3/uL (ref 0.0–0.5)
Eosinophils Relative: 3 %
HCT: 35.8 % — ABNORMAL LOW (ref 36.0–46.0)
Hemoglobin: 11.6 g/dL — ABNORMAL LOW (ref 12.0–15.0)
Immature Granulocytes: 0 %
Lymphocytes Relative: 33 %
Lymphs Abs: 1.6 10*3/uL (ref 0.7–4.0)
MCH: 27.2 pg (ref 26.0–34.0)
MCHC: 32.4 g/dL (ref 30.0–36.0)
MCV: 84 fL (ref 80.0–100.0)
Monocytes Absolute: 0.7 10*3/uL (ref 0.1–1.0)
Monocytes Relative: 14 %
Neutro Abs: 2.3 10*3/uL (ref 1.7–7.7)
Neutrophils Relative %: 50 %
Platelets: 260 10*3/uL (ref 150–400)
RBC: 4.26 MIL/uL (ref 3.87–5.11)
RDW: 14.6 % (ref 11.5–15.5)
WBC: 4.8 10*3/uL (ref 4.0–10.5)
nRBC: 0 % (ref 0.0–0.2)

## 2022-01-29 LAB — GLUCOSE, CAPILLARY
Glucose-Capillary: 129 mg/dL — ABNORMAL HIGH (ref 70–99)
Glucose-Capillary: 212 mg/dL — ABNORMAL HIGH (ref 70–99)
Glucose-Capillary: 190 mg/dL — ABNORMAL HIGH (ref 70–99)
Glucose-Capillary: 195 mg/dL — ABNORMAL HIGH (ref 70–99)

## 2022-01-29 LAB — PHOSPHORUS: Phosphorus: 4 mg/dL (ref 2.5–4.6)

## 2022-01-29 LAB — MAGNESIUM: Magnesium: 1.9 mg/dL (ref 1.7–2.4)

## 2022-01-29 MED ORDER — CLOPIDOGREL BISULFATE 75 MG PO TABS
75.0000 mg | ORAL_TABLET | Freq: Every day | ORAL | Status: DC
  Administered 2022-01-29 – 2022-02-01 (×4): 75 mg via ORAL
  Filled 2022-01-29 (×4): qty 1

## 2022-01-29 MED ORDER — LABETALOL HCL 5 MG/ML IV SOLN
10.0000 mg | INTRAVENOUS | Status: DC | PRN
  Administered 2022-01-29 – 2022-01-31 (×3): 10 mg via INTRAVENOUS
  Filled 2022-01-29 (×5): qty 4

## 2022-01-29 MED ORDER — ASPIRIN 81 MG PO CHEW
81.0000 mg | CHEWABLE_TABLET | Freq: Every day | ORAL | Status: DC
  Administered 2022-01-29 – 2022-02-01 (×4): 81 mg via ORAL
  Filled 2022-01-29 (×4): qty 1

## 2022-01-29 MED ORDER — METOPROLOL TARTRATE 25 MG PO TABS
25.0000 mg | ORAL_TABLET | Freq: Two times a day (BID) | ORAL | Status: DC
  Administered 2022-01-29 – 2022-02-01 (×7): 25 mg via ORAL
  Filled 2022-01-29 (×7): qty 1

## 2022-01-29 NOTE — Consult Note (Signed)
Cardiology Consultation:   Patient ID: Krista [name] MRN: [id number]; DOB: January 20, 1961  Admit date: 01/27/2022 Date of Consult: 01/29/2022  PCP:  Drue Flirt, MD   [facility] Providers Cardiologist:  Quay Burow, MD        Patient Profile:   Krista [name] is a 61 y.o. female with a hx of endometrial cancer, hypertension, hyperlipidemia, diabetes, tobacco abuse, CAD status post LAD PCI who is being seen 01/29/2022 for the evaluation of elevated troponin at the request of Dr. Florene Glen.  History of Present Illness:   Krista [name] presented to the hospital after a fall.  She fell 1 week ago and had left-sided chest pain.  She reports that she tripped over a log in the dark.  She had no preceding cardiac symptoms.  She has not had any issues with chest pain, shortness of breath, lightheadedness, or palpitations.  She notes that she has felt poorly for last few weeks with a cough.  There was 1 episode of hemoptysis and productive cough.  She denies fevers or chills.  She was admitted for pain control related to her rib fractures.  On imaging she was found to have multiple pulmonary nodules and multifocal mediastinal and bilateral hilar adenopathy.  She was also found to have several pelvic masses consistent with metastatic disease.  She has been evaluated by oncology and they are planning  for chemotherapy.  As part of her work-up cardiac enzymes were drawn and were elevated (hs-troponin 301-->207).  EKG revealed sinus rhythm at 95 bpm with LAFB and borderline for LVH.  There were no acute ischemic changes.  She notes that she does not get any exercise at baseline but has no chest pain or shortness of breath with daily activities.  Ms. Kliewer previously had an NSTEMI 12/2018.  At that time she was found to have significant LAD disease and had a synergy drug-eluting stent placed.  She also had diffuse moderate disease in the RCA and 45% RI disease.  She has not had any chest pain since the  time of her stent placement.  Past Medical History:  Diagnosis Date   Arthritis    CAD (coronary artery disease)    Depression    Diabetes mellitus    Hypertension    NSTEMI (non-ST elevated myocardial infarction) [facility])     Past Surgical History:  Procedure Laterality Date   ANKLE SURGERY     left ankle - was broken   CORONARY STENT INTERVENTION N/A 12/16/2018   Procedure: CORONARY STENT INTERVENTION;  Surgeon: Leonie Man, MD;  Location: [place] [facility];  Service: Cardiovascular;  Laterality: N/A;  LAD   LEFT HEART CATH AND CORONARY ANGIOGRAPHY N/A 12/16/2018   Procedure: LEFT HEART CATH AND CORONARY ANGIOGRAPHY;  Surgeon: Leonie Man, MD;  Location: [place] [facility];  Service: Cardiovascular;  Laterality: N/A;     Home Medications:  Prior to Admission medications   Medication Sig Start Date End Date Taking? Authorizing Provider  acetaminophen (TYLENOL) 500 MG tablet Take 1,000 mg by mouth every 6 (six) hours as needed for mild pain or headache.   Yes [provider]  aspirin EC 81 MG EC tablet Take 1 tablet (81 mg total) by mouth daily. Patient not taking: Reported on 01/28/2022 05/17/19   Myles Gip, DO  clopidogrel (PLAVIX) 75 MG tablet Take 1 tablet (75 mg total) by mouth daily. Patient not taking: Reported on 01/28/2022 09/05/19   Lorretta Harp, MD  diltiazem (  DILACOR XR) 180 MG 24 hr capsule Take 2 capsules (360 mg total) by mouth daily. Patient not taking: Reported on 01/28/2022 04/09/20   Almyra Deforest, PA  doxycycline (VIBRAMYCIN) 100 MG capsule Take 1 capsule (100 mg total) by mouth 2 (two) times daily. One po bid x 7 days Patient not taking: Reported on 01/28/2022 09/23/20   Domenic Moras, PA-C  doxycycline (VIBRAMYCIN) 100 MG capsule Take 1 capsule (100 mg total) by mouth 2 (two) times daily. One po bid x 7 days Patient not taking: Reported on 01/28/2022 09/23/20   Domenic Moras, PA-C  Evolocumab (REPATHA SURECLICK) 956 MG/ML SOAJ Inject 140 mg into the  skin every 14 (fourteen) days. Patient not taking: Reported on 01/28/2022 11/10/19   Lorretta Harp, MD  loratadine (CLARITIN) 10 MG tablet Take 1 tablet (10 mg total) by mouth daily as needed for allergies. Patient not taking: Reported on 01/28/2022 10/28/19   Lorretta Harp, MD  metFORMIN (GLUCOPHAGE) 1000 MG tablet Take 1,000 mg by mouth 2 (two) times daily with a meal. Patient not taking: Reported on 01/28/2022 04/29/19   [provider]  nitroGLYCERIN (NITROSTAT) 0.4 MG SL tablet Place 1 tablet (0.4 mg total) under the tongue every 5 (five) minutes as needed for chest pain. Patient not taking: Reported on 01/28/2022 10/28/19   Lorretta Harp, MD    Inpatient Medications: Scheduled Meds:  acetaminophen  1,000 mg Oral Q8H   amLODipine  5 mg Oral Daily   aspirin  81 mg Oral Daily   enoxaparin (LOVENOX) injection  40 mg Subcutaneous Q24H   guaiFENesin  600 mg Oral BID   insulin aspart  0-5 Units Subcutaneous QHS   insulin aspart  0-9 Units Subcutaneous TID WC   lidocaine  2 patch Transdermal Q24H   metoprolol tartrate  25 mg Oral BID   senna-docusate  1 tablet Oral QHS   Continuous Infusions:  lactated ringers 100 mL/hr at 01/29/22 0431   PRN Meds: acetaminophen **FOLLOWED BY** [START ON 01/31/2022] acetaminophen, benzonatate, HYDROmorphone (DILAUDID) injection, labetalol, melatonin, oxyCODONE, polyethylene glycol, prochlorperazine  Allergies:    Allergies  Allergen Reactions   Statins     itching   Zetia [Ezetimibe] Itching    Social History:   Social History   Socioeconomic History   Marital status: Single    Spouse name: Not on file   Number of children: Not on file   Years of education: Not on file   Highest education level: Not on file  Occupational History   Not on file  Tobacco Use   Smoking status: Every Day    Packs/day: 0.50    Years: 33.00    Total pack years: 16.50    Types: Cigarettes   Smokeless tobacco: Never  Substance and Sexual  Activity   Alcohol use: No   Drug use: No   Sexual activity: Yes    Birth control/protection: Post-menopausal  Other Topics Concern   Not on file  Social History Narrative   Not on file   Social Determinants of Health   Financial Resource Strain: Not on file  Food Insecurity: Not on file  Transportation Needs: No Transportation Needs (12/13/2020)   PRAPARE - Hydrologist (Medical): No    Lack of Transportation (Non-Medical): No  Physical Activity: Not on file  Stress: Not on file  Social Connections: Not on file  Intimate Partner Violence: Not on file    Family History:    Family History  Problem Relation Age of Onset   Diabetes Mother    COPD Mother    Hypertension Mother    Diabetes Father    Hypertension Father      ROS:  Please see the history of present illness.   All other ROS reviewed and negative.     Physical Exam/Data:   Vitals:   01/28/22 1921 01/28/22 2318 01/29/22 0431 01/29/22 0735  BP: (!) 161/91 (!) 152/91 (!) 176/117 132/89  Pulse: 75 72 87 81  Resp: '18 13 12 19  '$ Temp: 97.8 F (36.6 C) 98.6 F (37 C) 98.3 F (36.8 C) 98.1 F (36.7 C)  TempSrc: Oral Oral Oral Oral  SpO2: 96% 95% 96% 97%  Weight:      Height:        Intake/Output Summary (Last 24 hours) at 01/29/2022 1121 Last data filed at 01/29/2022 0431 Gross per 24 hour  Intake 2228.2 ml  Output 650 ml  Net 1578.2 ml      01/27/2022    9:37 PM 10/12/2020    2:00 PM 04/09/2020    3:59 PM  Last 3 Weights  Weight (lbs) 150 lb 154 lb 179 lb 6.4 oz  Weight (kg) 68.04 kg 69.854 kg 81.375 kg      VS:  BP 132/89   Pulse 81   Temp 98.1 F (36.7 C) (Oral)   Resp 19   Ht '5\' 3"'$  (1.6 m)   Wt 68 kg   SpO2 97%   BMI 26.57 kg/m  , BMI Body mass index is 26.57 kg/m. GENERAL:  Well appearing HEENT: Pupils equal round and reactive, fundi not visualized, oral mucosa unremarkable NECK:  No jugular venous distention, waveform within normal limits, carotid upstroke  brisk and symmetric, no bruits, no thyromegaly  LUNGS:  Clear to auscultation bilaterally HEART:  RRR.  PMI not displaced or sustained,S1 and S2 within normal limits, no S3, no S4, no clicks, no rubs, no murmurs ABD:  Flat, positive bowel sounds normal in frequency in pitch, no bruits, no rebound, no guarding, no midline pulsatile mass, no hepatomegaly, no splenomegaly EXT:  2 plus pulses throughout, no edema, no cyanosis no clubbing SKIN:  No rashes no nodules NEURO:  Cranial nerves II through XII grossly intact, motor grossly intact throughout PSYCH:  Cognitively intact, oriented to person place and time   EKG:  The EKG was personally reviewed and demonstrates:  Sinus rhythm.  Rate 95 bpm.  LAFB.  Possible LVH. Telemetry:  Telemetry was personally reviewed and demonstrates: Sinus rhythm.  No events.  Relevant CV Studies:  Echo 05/13/19:  1. Left ventricular ejection fraction, by visual estimation, is 60 to  65%. The left ventricle has normal function. Normal left ventricular size.  There is severely increased left ventricular hypertrophy.   2. Left ventricular diastolic Doppler parameters are consistent with  pseudonormalization pattern of LV diastolic filling.   3. Global right ventricle has normal systolic function.The right  ventricular size is normal.   4. Left atrial size was normal.   5. Right atrial size was normal.   6. The mitral valve is normal in structure. No evidence of mitral valve  regurgitation. No evidence of mitral stenosis.   7. The tricuspid valve is normal in structure. Tricuspid valve  regurgitation is mild.   8. The aortic valve is tricuspid Aortic valve regurgitation is trivial by  color flow Doppler. Mild aortic valve sclerosis without stenosis.   9. The pulmonic valve was normal in structure. Pulmonic valve  regurgitation is not visualized by color flow Doppler.  10. Aortic dilatation noted.  11. There is mild dilatation of the ascending aorta measuring 38  mm.  12. Mildly elevated pulmonary artery systolic pressure.  13. The inferior vena cava is dilated in size with >50% respiratory  variability, suggesting right atrial pressure of 8 mmHg.  14. Normal LV systolic function; grade 2 diastolic dysfunction; moderate  to severe LVH; mildly dilated ascending aorta; trace AI; mild TR.   LHC 12/16/2018: Prox LAD lesion is 99% stenosed with 99% stenosed side branch in Ost 1st Diag. A drug-eluting stent was successfully placed using a STENT SYNERGY DES 2.75X20. Postdilated to 3.1 mm Post intervention, there is a 0% residual stenosis. Post intervention, the side branch was reduced to 90% residual stenosis with downstream 80% stenosis. Plan is to treat this medically. ------------------------------------------------------ Prox RCA lesion is 60% stenosed. Mid RCA lesion is 35% stenosed. Mid RCA to Dist RCA lesion is 55% stenosed. RPDA lesion is 65% stenosed. Ost Ramus to Ramus lesion is 45% stenosed. The left ventricular systolic function is normal. The left ventricular ejection fraction is 50-55% by visual estimate. -Apical hypokinesis ==============================   SUMMARY Severe CAD with severe 99% proximal-mid LAD at takeoff of 1st Diag & Septal branches associated with severe 99% ostial stenosis in both branches (1st Diag is a relatively small caliber vessel and diffusely diseased), diffuse moderate to severe 55 to 65% stenoses in proximal and mid RCA as well as RPDA. Successful DES PCI of LAD lesion with synergy DES 2.7 mm x 20 mm (3.1 mm) preserving diagonal and septal flow. Preserved LVEF with apparent apical hypokinesis consistent with echocardiogram findings Normal LVEDP   RECOMMENDATIONS Return patient to nursing unit for ongoing care.  Anticipate potential discharge either later on this afternoon after bedrest versus tomorrow morning pending clinical evaluation. Would recommend aggressive risk factor management modification: High-dose statin,  smoking cessation counseling, close glycemic control, close blood pressure control using ACE inhibitor/ARB and beta-blocker if tolerated For now plan will be to treat the RCA lesions medically.  Given the diffuse nature of disease, there would be extensive amount of stent uses to cover the entire diseased segment.  Would reevaluate symptoms once she has recovered from this acute event. Dual antiplatelet coverage for minimum 1 year Echo 01/29/22:  1. Left ventricular ejection fraction, by estimation, is 55 to 60%. The  left ventricle has normal function. The left ventricle has no regional  wall motion abnormalities. There is moderate concentric left ventricular  hypertrophy. Left ventricular  diastolic parameters are consistent with Grade I diastolic dysfunction  (impaired relaxation).   2. Right ventricular systolic function is normal. The right ventricular  size is normal. There is normal pulmonary artery systolic pressure.   3. Left atrial size was mildly dilated.   4. The mitral valve is normal in structure. Trivial mitral valve  regurgitation. No evidence of mitral stenosis.   5. The aortic valve is tricuspid. Aortic valve regurgitation is trivial.  No aortic stenosis is present.   6. Aortic dilatation noted. There is mild dilatation of the aortic root,  measuring 39 mm.   7. The inferior vena cava is normal in size with <50% respiratory  variability, suggesting right atrial pressure of 8 mmHg.    Laboratory Data:  High Sensitivity Troponin:   Recent Labs  Lab 01/27/22 2133 01/28/22 0020  TROPONINIHS 301* 277*     Chemistry Recent Labs  Lab 01/27/22 1709 01/28/22 0608 01/29/22 0046  NA 136  135 138  K 3.8 3.8 4.3  CL 103 101 104  CO2 '24 23 24  '$ GLUCOSE 202* 137* 108*  BUN '14 11 18  '$ CREATININE 1.17* 1.15* 1.24*  CALCIUM 8.9 8.6* 8.9  MG  --  1.8 1.9  GFRNONAA 54* 55* 50*  ANIONGAP '9 11 10    '$ Recent Labs  Lab 01/27/22 1709 01/28/22 0608 01/29/22 0046  PROT 7.3  7.0 6.6  ALBUMIN 3.0* 2.9* 2.7*  AST 15 14* 16  ALT '12 11 14  '$ ALKPHOS 56 53 52  BILITOT 0.5 0.4 0.4   Lipids No results for input(s): "CHOL", "TRIG", "HDL", "LABVLDL", "LDLCALC", "CHOLHDL" in the last 168 hours.  Hematology Recent Labs  Lab 01/27/22 1709 01/28/22 0608 01/29/22 0046  WBC 7.3 6.9 4.8  RBC 4.65 4.43 4.26  HGB 12.7 12.4 11.6*  HCT 39.7 36.7 35.8*  MCV 85.4 82.8 84.0  MCH 27.3 28.0 27.2  MCHC 32.0 33.8 32.4  RDW 14.7 14.6 14.6  PLT 323 274 260   Thyroid No results for input(s): "TSH", "FREET4" in the last 168 hours.  BNPNo results for input(s): "BNP", "PROBNP" in the last 168 hours.  DDimer No results for input(s): "DDIMER" in the last 168 hours.   Assessment and Plan:   #CAD status post LAD PCI: #Elevated troponin: Patient has elevated high-sensitivity troponin.  She has known, moderate disease in her RCA.  However she has no ischemic chest pain.  The only chest pain that she had is from her rib fractures.  She does not get much exercise at baseline but denies any anginal symptoms at baseline.  Given that she has metastatic cancer and is planning for chemotherapy and no ischemic symptoms, I think that the risks of invasive work-up and possible stent placement outweigh the benefits.  We repeated an echocardiogram and systolic function is completely normal.  In fact, it is better than at the time of her PCI several years ago.  Recommend continuing aggressive medical management.  Continue aspirin, clopidogrel, and Repatha.  Lipid panel is pending.  Encourage smoking cessation.  #Hypertension: Blood pressure has been quite elevated this admission.  Agree with adding metoprolol and amlodipine instead of the diltiazem that she was on at baseline.  Blood pressures seem to be better today.  Her blood pressure goal is less than 130/80.  She had severe LVH on her echo in 2020 and it is now mild to moderate.  This is likely due to improve blood pressure control.  #Ascending  aortic aneurysm: Ascending aorta is 3.9 cm on echo this admission.  It was 3.8 cm in 2020.  Overall stable.  Blood pressure control as above.   Risk Assessment/Risk Scores:     HEAR Score (for undifferentiated chest pain):             For questions or updates, please contact [place] Please consult [email or web address] for contact info under    Signed, Skeet Latch, MD  01/29/2022 11:21 AM

## 2022-01-29 NOTE — Progress Notes (Addendum)
PROGRESS NOTE    Krista [name]  PXT:[id number] DOB: [birth year] DOA: 01/27/2022 PCP: Drue Flirt, MD  Chief Complaint  Patient presents with   Cough    Brief Narrative:  Krista [name] is Krista [name] 61 y.o. female with medical history significant for endometrial carcinoma, coronary artery disease, prior MI, type 2 diabetes, hyperlipidemia, hypertension, chronic anxiety/depression, former tobacco use disorder quit 2 weeks ago, who presented to [facility] ED from home due to left-sided chest pain after falling on her left side 1 week ago.  Associated with 1 episode of hemoptysis 3 weeks ago and Bird Swetz productive cough of yellow sputum.    She's been admitted for pain control related to rib fractures and her metastatic disease.  Imaging shows multiple pulm nodules, multifocal mediastinal and bilateral hilar adenopathy c/w metastatic disease and multiple abdominal and pelvic implants.  Oncology planning for chemotherapy pending cardiac evaluation.  See below for additional details.    Assessment & Plan:   Principal Problem:   Generalized pain Active Problems:   Cancer related pain   Rib fractures   Metastatic disease ([place])   Endometrial cancer ([place])   Hydronephrosis of right kidney   Hemoptysis   Elevated troponin   Cough   Elevated serum creatinine   Protein-calorie malnutrition, moderate ([name])   TOBACCO ABUSE   Type 2 diabetes mellitus ([name])   Goals of care, counseling/discussion   Assessment and Plan: Cancer related pain Pain primarily in L chest, likely due to rib fractures which appear pathologic by imaging.  No R sided abdominal/flank pain (notable in setting of R hydro. Scheduled apap, stop toradol today (limited to 1 day with L sided hydro, unclear baseline creatinine - hydrate).  Prn oxycodone, prn dilaudid.  Lidocaine patch.  She notes pain much better today, well controlled PT/OT.  Rib fractures Related to mechanical fall and metastasis (appeared pathologic) Pain control,  pulm toilet   Hemoptysis Related to metastatic disease Occurred 3 weeks ago Follow closely  Hydronephrosis of right kidney Related to metastatic disease Discussed with urology on call, given no R sided flank pain, creatinine not significantly elevated, can pursue outpatient follow up with [facility]  Endometrial cancer [facility]) previously followed at [facility] and was treated with Robotic assisted hysterectomy with BSO, sentinel lymph node dissection 12/2020.  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  Last oncology note was 03/2021 and looks like plan for cycle #1 of Taxol + Carbo + Herceptin (looks like given 03/23/2021, she appears to have been lost to follow up after this).   Appreciate oncology assistance, planning for transfer to [facility] for chemotherapy (awaiting echo first given elevated troponin at presentation)     Metastatic disease [facility]) Presume this is related to hx endometrial cancer? CT chest notes 4 cm spiculated nodule in R perihilatr region that is cavitating -> could be primary site? CT chest with multiple pulm nodules c/w metastatic disease, multifocal medistinal and bilateral hilar adenopathy, c/w metastatic disease CT abd/pelvis with multiple abdominal and pelvic implants as well  Per oncology  Elevated troponin LHC in 12/2018 with severe CAD s/p DES PCI to LAD lesion No chest pain other than L sided rib pain related to fx downtrending Similar troponin elevation in 05/2019, at that time, thought demand (peaked to 501) EKG without acute findings (mild inferior ST depression? Appears chronic) Follow echo (pending today) Will consult cardiology with plan for chemotherapy, significant hx CAD She's not taking aspirin/plavix at this time.  Allergy listed to statins,  itching? Will resume aspirin A1c, lipids  Cough Symptomatic management Related to pulm metastatic disease  Protein-calorie malnutrition, moderate ([name]) RD  Elevated serum  creatinine Unclear recent baseline Mild uptrend, follow closely with right hydro Hold further toradol Follow with IVF  Type 2 diabetes mellitus ([place]) A1c pending SSI  TOBACCO ABUSE Quit 2 weeks ago  Goals of care, counseling/discussion Palliative c/s     DVT prophylaxis: lovenox Code Status: dnr Family Communication: none Disposition:   Status is: Inpatient Remains inpatient appropriate because: pending chemo, cardiac w/u   Consultants:  Cards oncology  Procedures:  none  Antimicrobials:  Anti-infectives (From admission, onward)    None       Subjective: She denies any pain Comfortable She thinks the lidocaine patch really helped yesterday  Objective: Vitals:   01/28/22 1921 01/28/22 2318 01/29/22 0431 01/29/22 0735  BP: (!) 161/91 (!) 152/91 (!) 176/117 132/89  Pulse: 75 72 87 81  Resp: '18 13 12 19  '$ Temp: 97.8 F (36.6 C) 98.6 F (37 C) 98.3 F (36.8 C) 98.1 F (36.7 C)  TempSrc: Oral Oral Oral Oral  SpO2: 96% 95% 96% 97%  Weight:      Height:        Intake/Output Summary (Last 24 hours) at 01/29/2022 0924 Last data filed at 01/29/2022 0431 Gross per 24 hour  Intake 2468.2 ml  Output 1000 ml  Net 1468.2 ml   Filed Weights   01/27/22 2137  Weight: 68 kg    Examination:  General exam: Appears calm and comfortable  Respiratory system: unlabored Cardiovascular system: RRR Gastrointestinal system: Abdomen is nondistended, soft and nontender.  Central nervous system: Alert and oriented. No focal neurological deficits. Extremities: no LEE    Data Reviewed: I have personally reviewed following labs and imaging studies  CBC: Recent Labs  Lab 01/27/22 1709 01/28/22 0608 01/29/22 0046  WBC 7.3 6.9 4.8  NEUTROABS 4.0 3.8 2.3  HGB 12.7 12.4 11.6*  HCT 39.7 36.7 35.8*  MCV 85.4 82.8 84.0  PLT 323 274 401    Basic Metabolic Panel: Recent Labs  Lab 01/27/22 1709 01/28/22 0608 01/29/22 0046  NA 136 135 138  K 3.8 3.8 4.3  CL  103 101 104  CO2 '24 23 24  '$ GLUCOSE 202* 137* 108*  BUN '14 11 18  '$ CREATININE 1.17* 1.15* 1.24*  CALCIUM 8.9 8.6* 8.9  MG  --  1.8 1.9  PHOS  --  3.8 4.0    GFR: Estimated Creatinine Clearance: 45.7 mL/min (Daneshia Tavano) (by C-G formula based on SCr of 1.24 mg/dL (H)).  Liver Function Tests: Recent Labs  Lab 01/27/22 1709 01/28/22 0608 01/29/22 0046  AST 15 14* 16  ALT '12 11 14  '$ ALKPHOS 56 53 52  BILITOT 0.5 0.4 0.4  PROT 7.3 7.0 6.6  ALBUMIN 3.0* 2.9* 2.7*    CBG: Recent Labs  Lab 01/28/22 0917 01/28/22 1133 01/28/22 1606 01/28/22 2104 01/29/22 0600  GLUCAP 211* 180* 165* 298* 129*     Recent Results (from the past 240 hour(s))  MRSA Next Gen by PCR, Nasal     Status: None   Collection Time: 01/28/22 10:05 AM   Specimen: Nasal Mucosa; Nasal Swab  Result Value Ref Range Status   MRSA by PCR Next Gen NOT DETECTED NOT DETECTED Final    Comment: (NOTE) The GeneXpert MRSA Assay (FDA approved for NASAL specimens only), is one component of [facility]iden comprehensive MRSA colonization surveillance program. It is not intended to diagnose MRSA infection nor  to guide or monitor treatment for MRSA infections. Test performance is not FDA approved in patients less than 74 years old. Performed at [facility], [address], [place], [place] [address]          Radiology Studies: DG Chest 2 View  Addendum Date: 01/28/2022   ADDENDUM REPORT: 01/28/2022 15:05 ADDENDUM: Tip of right chest port is seen in the region of right innominate vein. Electronically Signed   By: Elmer Picker M.D.   On: 01/28/2022 15:05   Result Date: 01/28/2022 CLINICAL DATA:  Shortness of breath, cough EXAM: CHEST - 2 VIEW COMPARISON:  Previous studies including the examination of 05/14/2019 FINDINGS: Cardiac size is within normal limits. Thoracic aorta is tortuous. Right hilum appears more prominent. There is 2.8 cm smooth marginated nodule in the left upper lung fields adjacent to the left hilum.Rest of  the lung fields are essentially clear. There is no pleural effusion or pneumothorax. IMPRESSION: There is 2.8 cm nodular density in the left parahilar region. Possibility of malignant neoplasm is not excluded. Follow-up CT chest may be considered. Right hilum appears more prominent. This may be due to prominent central pulmonary vessels or lymphadenopathy. Electronically Signed: By: Elmer Picker M.D. On: 01/27/2022 17:45   CT ABDOMEN PELVIS W CONTRAST  Result Date: 01/28/2022 CLINICAL DATA:  Evaluate for metastatic disease. EXAM: CT ABDOMEN AND PELVIS WITH CONTRAST TECHNIQUE: Multidetector CT imaging of the abdomen and pelvis was performed using the standard protocol following bolus administration of intravenous contrast. RADIATION DOSE REDUCTION: This exam was performed according to the departmental dose-optimization program which includes automated exposure control, adjustment of the mA and/or kV according to patient size and/or use of iterative reconstruction technique. CONTRAST:  124m OMNIPAQUE IOHEXOL 300 MG/ML  SOLN COMPARISON:  CT abdomen pelvis dated 09/22/2020. chest CT dated 01/27/2022. FINDINGS: Lower chest: Multiple pulmonary nodules in the visualized right lung base consistent with metastatic disease. Partially visualized right perihilar nodular densities (1/3) corresponding to the right hilar mass or lymphadenopathy. These findings are better evaluated on the chest CT of 01/27/2022. No intra-abdominal free air.  Small free fluid in the pelvis. Hepatobiliary: Faint small nodular deposit along the liver capsule in the right lobe of the liver (12/3) measuring up to 12 mm most consistent with metastatic implant. No intrahepatic biliary ductal dilatation. The gallbladder is unremarkable. Pancreas: The pancreas is unremarkable. No active inflammatory changes. No dilatation of the main pancreatic duct. Spleen: The spleen is unremarkable. Adrenals/Urinary Tract: The adrenal glands are unremarkable.  There is moderate right hydronephrosis and mild right hydroureter secondary to compression and obstruction of the distal right ureter by Rula Keniston metastatic lesion in the right hemipelvis. The left kidney is unremarkable. The urinary bladder is minimally distended and grossly unremarkable. Stomach/Bowel: There is no bowel obstruction or active inflammation. The appendix is normal. Vascular/Lymphatic: Moderate aortoiliac atherosclerotic disease. The IVC is unremarkable. No portal venous gas. No retroperitoneal adenopathy. Reproductive: Hysterectomy. Other: Several pelvic masses consistent with metastatic disease. The masses demonstrate central low attenuation, likely necrotic tissue. The largest measuring 4.4 x 3.5 x 4.7 cm in the right hemipelvis. This mass causes compression of the distal right ureter with associated right-sided hydronephrosis. Additional metastatic implants in the omentum and in the left upper abdomen measure up to 4.3 x 3.3 cm in axial dimensions (42/3). There is Ahmari Garton 4.5 x 2.3 cm metastatic implant in the right lateral abdominal wall musculature (45/3). Faint 1.4 x 1.0 cm nodular density over the left diaphragmatic crus (22/3)  suspicious for metastatic implant. Musculoskeletal: There is osteopenia with degenerative changes of the spine. No acute osseous pathology. No suspicious bone lesions. IMPRESSION: 1. Multiple abdominal and pelvic metastatic implants as above. 2. Moderate right hydronephrosis secondary to mass effect and obstruction of the distal right ureter by Joely Losier right pelvic metastatic implant. 3. Multiple pulmonary metastatic disease in the visualized right lung base. 4. No bowel obstruction. Normal appendix. 5. Aortic Atherosclerosis (ICD10-I70.0). Electronically Signed   By: Anner Crete M.D.   On: 01/28/2022 03:41   CT Angio Chest PE W and/or Wo Contrast  Result Date: 01/27/2022 CLINICAL DATA:  Pulmonary embolism (PE) suspected, high prob History of cancer, pleuritic left-sided chest  pain, short of breath. Abnormality on chest x-ray CT recommended. Rule out PE versus cancer versus pneumonia versus soft tissue injury EXAM: CT ANGIOGRAPHY CHEST WITH CONTRAST TECHNIQUE: Multidetector CT imaging of the chest was performed using the standard protocol during bolus administration of intravenous contrast. Multiplanar CT image reconstructions and MIPs were obtained to evaluate the vascular anatomy. RADIATION DOSE REDUCTION: This exam was performed according to the departmental dose-optimization program which includes automated exposure control, adjustment of the mA and/or kV according to patient size and/or use of iterative reconstruction technique. CONTRAST:  71m OMNIPAQUE IOHEXOL 350 MG/ML SOLN COMPARISON:  Chest radiograph earlier today. No prior chest CT FINDINGS: Cardiovascular: There are no filling defects within the pulmonary arteries to suggest pulmonary embolus. Mild aortic atherosclerosis. The heart is normal in size. There are coronary artery calcifications. No pericardial effusion. Mediastinum/Nodes: There is multifocal mediastinal adenopathy. Anterior paratracheal node versus nodal conglomerate measuring 2 cm short axis series 5, image 45. Subcarinal node versus nodal conglomerate measuring 2.8 cm series 5, image 56. There are multiple enlarged right hilar nodes measuring from 14 to 19 mm. There are enlarged left hilar nodes measuring up to 15 mm. There is Deborrah Mabin 14 mm right epicardial node. The esophagus is decompressed. Lungs/Pleura: Moderate emphysema and bronchial thickening. The right lobar bronchi are attenuated due to adjacent adenopathy. There are multiple pulmonary nodules throughout both lungs of varying sizes. Right perihilar spiculated nodule spanning the fissure measures 4 x 2.3 x 3.6 cm and is cavitating series 6, image 47, series 8, image 77. 2.5 cm left upper lobe nodule corresponds to abnormality on CT. Majority of the additional nodules range from 5-15 mm. Fissural thickening  in the left lung. No features of pulmonary edema. There is no endobronchial lesion. No pleural effusion. Upper Abdomen: Limited assessment for focal hepatic lesion given phase of contrast. There is Elaijah Munoz small soft tissue density medial to the right hepatic lobe posterior to the right kidney series 5, image 127. Right hydronephrosis is partially included in the field of view. Lack of upper abdominal bowel fat limits detailed assessment. Musculoskeletal: Nondisplaced left anterior seventh and eighth rib fractures. There is some soft tissue thickening adjacent to the seventh rib fracture suggesting this may be pathologic. No chest wall soft tissue abnormalities. Review of the MIP images confirms the above findings. IMPRESSION: 1. No pulmonary embolus. 2. Findings consistent with intrathoracic malignancy. Multiple pulmonary nodules throughout both lungs of varying sizes, consistent with metastatic disease. Primary site of malignancy may represent Kristilyn Coltrane 4 cm spiculated nodule in the right perihilar region spanning the fissure that is cavitating. Alternatively all of these nodules may be metastatic given history of cancer, type not specified. 3. Multifocal mediastinal and bilateral hilar adenopathy, consistent metastatic disease. 4. Nondisplaced left anterior seventh and eighth rib fractures, soft tissue thickening adjacent  to the seventh rib fracture suggests this may be pathologic 5. Right hydronephrosis is partially included in the field of view in the upper abdomen. Small soft tissue density medial to the right hepatic lobe posterior to the right kidney is nonspecific but may represent metastatic disease. Recommend staging CT of the abdomen and pelvis with oral and IV contrast. 6. Moderate emphysema. 7. Aortic atherosclerosis.  Coronary artery calcifications. Aortic Atherosclerosis (ICD10-I70.0) and Emphysema (ICD10-J43.9). Electronically Signed   By: Keith Rake M.D.   On: 01/27/2022 23:03        Scheduled  Meds:  acetaminophen  1,000 mg Oral Q8H   amLODipine  5 mg Oral Daily   aspirin  81 mg Oral Daily   enoxaparin (LOVENOX) injection  40 mg Subcutaneous Q24H   guaiFENesin  600 mg Oral BID   insulin aspart  0-5 Units Subcutaneous QHS   insulin aspart  0-9 Units Subcutaneous TID WC   lidocaine  2 patch Transdermal Q24H   metoprolol tartrate  25 mg Oral BID   senna-docusate  1 tablet Oral QHS   Continuous Infusions:  lactated ringers 100 mL/hr at 01/29/22 0431     LOS: 1 day    Time spent: over 30 min    Fayrene Helper, MD [facility]   To contact the attending provider between 7A-7P or the covering provider during after hours 7P-7A, please log into the web site [email or web address] and access using universal [place] password for that web site. If you do not have the password, please call the hospital operator.  01/29/2022, 9:24 AM

## 2022-01-29 NOTE — Progress Notes (Signed)
Krista [name]   DOB:1960-12-29   OI#:[id number]    ASSESSMENT & PLAN:   Recurrent, metastatic uterine cancer, high-grade serous, HER2 positive disease, MSI stable I told the patient CT finding is compatible with metastatic, stage IV uterine cancer I told her that her disease is now considered incurable The patient had nausea with last cycle of treatment She is keen to receive palliative chemotherapy locally here I discussed the case with hospitalist.   IV team did not come by to access her port As soon as she gets transferred to [facility], I will get our nursing staff to access her port there Echocardiogram is normal I recommend we proceed with chemotherapy with carboplatin and paclitaxel along with Herceptin next week It would likely be Tuesday the earliest before I can arrange for her to receive inpatient chemotherapy We discussed the risk, benefits, side effects of chemotherapy and she wants to proceed  History of severe coronary artery disease, multiple cardiovascular risk factors including diabetes, hypertension and smoking, with elevated serum troponin I am concerned about elevated serum troponin Echocardiogram result showed no signs of cardiomyopathy She was evaluated by cardiologist who recommend we continue on medical management  Right-sided hydronephrosis with mild chronic renal failure She does not need stent placement right now Continue risk factor modification and hydration   History of tobacco use The patient stated she has quit smoking for 2 weeks   Poorly controlled diabetes She will likely have significant risk and side effects with neuropathy while on treatment We discussed the importance of dietary and lifestyle changes   Medical noncompliance The patient has been noncompliant with follow-up at another health system I told the patient that her disease is terminal and without chemotherapy inconsistent follow-up, her prognosis is very poor   Moderate protein  calorie malnutrition We will get dietitian to see   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 The patient knows to call the clinic with any problems, questions or concerns.   The total time spent in the appointment was 55 minutes encounter with patients including review of chart and various tests results, discussions about plan of care and coordination of care plan  Heath Lark, MD 01/29/2022 1:48 PM  Subjective:  She felt better with pain control.  Her mother is by the bedside.  Echocardiogram is normal.  Appreciated cardiology reviewed  Objective:  Vitals:   01/29/22 0735 01/29/22 1118  BP: 132/89 (!) 164/105  Pulse: 81 63  Resp: 19 18  Temp: 98.1 F (36.7 C) 98 F (36.7 C)  SpO2: 97% 95%     Intake/Output Summary (Last 24 hours) at 01/29/2022 1348 Last data filed at 01/29/2022 0431 Gross per 24 hour  Intake 2228.2 ml  Output 350 ml  Net 1878.2 ml    GENERAL:alert, no distress and comfortable NEURO: alert & oriented x 3 with fluent speech, no focal motor/sensory deficits   Labs:  Recent Labs    01/27/22 1709 01/28/22 0608 01/29/22 0046  NA 136 135 138  K 3.8 3.8 4.3  CL 103 101 104  CO2 '24 23 24  ' GLUCOSE 202* 137* 108*  BUN '14 11 18  ' CREATININE 1.17* 1.15* 1.24*  CALCIUM 8.9 8.6* 8.9  GFRNONAA 54* 55* 50*  PROT 7.3 7.0 6.6  ALBUMIN 3.0* 2.9* 2.7*  AST 15 14* 16  ALT '12 11 14  ' ALKPHOS 56 53 52  BILITOT 0.5 0.4 0.4    Studies:  ECHOCARDIOGRAM COMPLETE  Result Date: 01/29/2022    ECHOCARDIOGRAM REPORT   Patient Name:   Krista [name] Date of Exam: 01/29/2022 Medical Rec #:  [id number]        Height:       63.0 in Accession #:    [id number]        Weight:       150.0 lb Date of Birth:  [birth year]       BSA:          14.711 m Patient Age:    61 years         BP:           132/89 mmHg Patient Gender: F                HR:           70 bpm. Exam Location:  Inpatient Procedure: 2D Echo, Cardiac Doppler and Color Doppler Indications:    Elevated Troponin  History:        Patient has prior history of Echocardiogram examinations, most                 recent 05/13/2019. Previous Myocardial Infarction and CAD; Risk                 Factors:Hypertension and Diabetes.  Sonographer:    Bernadene Person RDCS Referring Phys: [id number] [place]  1. Left ventricular ejection fraction, by estimation, is 55 to 60%. The left ventricle has normal function. The left ventricle has no regional wall motion abnormalities. There is moderate concentric left ventricular hypertrophy. Left ventricular diastolic parameters are consistent with Grade I diastolic dysfunction (impaired relaxation).  2. Right ventricular systolic function is normal. The right ventricular size is normal. There is normal pulmonary artery systolic pressure.  3. Left atrial size was mildly dilated.  4. The mitral valve is normal in structure. Trivial mitral valve regurgitation. No evidence of mitral stenosis.  5. The aortic valve is tricuspid. Aortic valve regurgitation is trivial. No aortic stenosis is present.  6. Aortic dilatation noted. There is mild dilatation of the aortic root, measuring 39 mm.  7. The inferior vena cava is normal in size with <50% respiratory variability, suggesting right atrial pressure of 8 mmHg. FINDINGS  Left Ventricle: Left ventricular ejection fraction, by estimation, is 55 to 60%. The left ventricle has normal function. The left ventricle has no regional wall motion abnormalities. The left ventricular internal cavity size was normal in size. There is  moderate concentric left ventricular hypertrophy. Left ventricular diastolic parameters are consistent with Grade I  diastolic dysfunction (impaired relaxation). Indeterminate filling pressures. Right Ventricle: The right ventricular size is normal. No increase in right ventricular wall thickness. Right ventricular systolic function is normal. There is normal pulmonary artery systolic pressure. The tricuspid regurgitant velocity is 1.46 m/s, and  with an assumed right atrial pressure of 8 mmHg, the estimated right ventricular systolic pressure is 09.6 mmHg. Left Atrium: Left atrial size was mildly dilated. Right Atrium: Right atrial size was normal in size. Pericardium: There is no evidence of pericardial effusion. Mitral Valve: The mitral valve is normal in structure. Trivial mitral valve regurgitation. No evidence of mitral valve stenosis. Tricuspid Valve: The  tricuspid valve is normal in structure. Tricuspid valve regurgitation is trivial. No evidence of tricuspid stenosis. Aortic Valve: The aortic valve is tricuspid. Aortic valve regurgitation is trivial. No aortic stenosis is present. Pulmonic Valve: The pulmonic valve was normal in structure. Pulmonic valve regurgitation is not visualized. No evidence of pulmonic stenosis. Aorta: Aortic dilatation noted. There is mild dilatation of the aortic root, measuring 39 mm. Venous: The inferior vena cava is normal in size with less than 50% respiratory variability, suggesting right atrial pressure of 8 mmHg. IAS/Shunts: No atrial level shunt detected by color flow Doppler.  LEFT VENTRICLE PLAX 2D LVIDd:         4.00 cm     Diastology LVIDs:         2.50 cm     LV e' medial:    4.05 cm/s LV PW:         1.30 cm     LV E/e' medial:  10.4 LV IVS:        1.30 cm     LV e' lateral:   5.45 cm/s LVOT diam:     2.20 cm     LV E/e' lateral: 7.7 LV SV:         85 LV SV Index:   50 LVOT Area:     3.80 cm  LV Volumes (MOD) LV vol d, MOD A2C: 81.8 ml LV vol d, MOD A4C: 90.6 ml LV vol s, MOD A2C: 32.0 ml LV vol s, MOD A4C: 39.3 ml LV SV MOD A2C:     49.8 ml LV SV MOD A4C:     90.6 ml LV SV MOD BP:       52.9 ml RIGHT VENTRICLE RV S prime:     14.30 cm/s TAPSE (M-mode): 2.1 cm LEFT ATRIUM             Index        RIGHT ATRIUM           Index LA diam:        3.70 cm 2.16 cm/m   RA Area:     15.60 cm LA Vol (A2C):   55.3 ml 32.32 ml/m  RA Volume:   38.90 ml  22.73 ml/m LA Vol (A4C):   47.9 ml 27.99 ml/m LA Biplane Vol: 53.4 ml 31.21 ml/m  AORTIC VALVE LVOT Vmax:   114.00 cm/s LVOT Vmean:  73.300 cm/s LVOT VTI:    0.223 m  AORTA Ao Root diam: 3.90 cm MITRAL VALVE               TRICUSPID VALVE MV Area (PHT): 2.00 cm    TR Peak grad:   8.5 mmHg MV Decel Time: 380 msec    TR Vmax:        146.00 cm/s MV E velocity: 42.20 cm/s MV A velocity: 79.50 cm/s  SHUNTS MV E/A ratio:  0.53        Systemic VTI:  0.22 m                            Systemic Diam: 2.20 cm Skeet Latch MD Electronically signed by Skeet Latch MD Signature Date/Time: 01/29/2022/10:57:38 AM    Final    DG Chest 2 View  Addendum Date: 01/28/2022   ADDENDUM REPORT: 01/28/2022 15:05 ADDENDUM: Tip of right chest port is seen in the region of right innominate vein. Electronically Signed   By: Prudy Feeler.D.  On: 01/28/2022 15:05   Result Date: 01/28/2022 CLINICAL DATA:  Shortness of breath, cough EXAM: CHEST - 2 VIEW COMPARISON:  Previous studies including the examination of 05/14/2019 FINDINGS: Cardiac size is within normal limits. Thoracic aorta is tortuous. Right hilum appears more prominent. There is 2.8 cm smooth marginated nodule in the left upper lung fields adjacent to the left hilum.Rest of the lung fields are essentially clear. There is no pleural effusion or pneumothorax. IMPRESSION: There is 2.8 cm nodular density in the left parahilar region. Possibility of malignant neoplasm is not excluded. Follow-up CT chest may be considered. Right hilum appears more prominent. This may be due to prominent central pulmonary vessels or lymphadenopathy. Electronically Signed: By: Elmer Picker M.D. On: 01/27/2022 17:45    CT ABDOMEN PELVIS W CONTRAST  Result Date: 01/28/2022 CLINICAL DATA:  Evaluate for metastatic disease. EXAM: CT ABDOMEN AND PELVIS WITH CONTRAST TECHNIQUE: Multidetector CT imaging of the abdomen and pelvis was performed using the standard protocol following bolus administration of intravenous contrast. RADIATION DOSE REDUCTION: This exam was performed according to the departmental dose-optimization program which includes automated exposure control, adjustment of the mA and/or kV according to patient size and/or use of iterative reconstruction technique. CONTRAST:  170m OMNIPAQUE IOHEXOL 300 MG/ML  SOLN COMPARISON:  CT abdomen pelvis dated 09/22/2020. chest CT dated 01/27/2022. FINDINGS: Lower chest: Multiple pulmonary nodules in the visualized right lung base consistent with metastatic disease. Partially visualized right perihilar nodular densities (1/3) corresponding to the right hilar mass or lymphadenopathy. These findings are better evaluated on the chest CT of 01/27/2022. No intra-abdominal free air.  Small free fluid in the pelvis. Hepatobiliary: Faint small nodular deposit along the liver capsule in the right lobe of the liver (12/3) measuring up to 12 mm most consistent with metastatic implant. No intrahepatic biliary ductal dilatation. The gallbladder is unremarkable. Pancreas: The pancreas is unremarkable. No active inflammatory changes. No dilatation of the main pancreatic duct. Spleen: The spleen is unremarkable. Adrenals/Urinary Tract: The adrenal glands are unremarkable. There is moderate right hydronephrosis and mild right hydroureter secondary to compression and obstruction of the distal right ureter by a metastatic lesion in the right hemipelvis. The left kidney is unremarkable. The urinary bladder is minimally distended and grossly unremarkable. Stomach/Bowel: There is no bowel obstruction or active inflammation. The appendix is normal. Vascular/Lymphatic: Moderate aortoiliac  atherosclerotic disease. The IVC is unremarkable. No portal venous gas. No retroperitoneal adenopathy. Reproductive: Hysterectomy. Other: Several pelvic masses consistent with metastatic disease. The masses demonstrate central low attenuation, likely necrotic tissue. The largest measuring 4.4 x 3.5 x 4.7 cm in the right hemipelvis. This mass causes compression of the distal right ureter with associated right-sided hydronephrosis. Additional metastatic implants in the omentum and in the left upper abdomen measure up to 4.3 x 3.3 cm in axial dimensions (42/3). There is a 4.5 x 2.3 cm metastatic implant in the right lateral abdominal wall musculature (45/3). Faint 1.4 x 1.0 cm nodular density over the left diaphragmatic crus (22/3) suspicious for metastatic implant. Musculoskeletal: There is osteopenia with degenerative changes of the spine. No acute osseous pathology. No suspicious bone lesions. IMPRESSION: 1. Multiple abdominal and pelvic metastatic implants as above. 2. Moderate right hydronephrosis secondary to mass effect and obstruction of the distal right ureter by a right pelvic metastatic implant. 3. Multiple pulmonary metastatic disease in the visualized right lung base. 4. No bowel obstruction. Normal appendix. 5. Aortic Atherosclerosis (ICD10-I70.0). Electronically Signed   By: ALaren EvertsD.  On: 01/28/2022 03:41   CT Angio Chest PE W and/or Wo Contrast  Result Date: 01/27/2022 CLINICAL DATA:  Pulmonary embolism (PE) suspected, high prob History of cancer, pleuritic left-sided chest pain, short of breath. Abnormality on chest x-ray CT recommended. Rule out PE versus cancer versus pneumonia versus soft tissue injury EXAM: CT ANGIOGRAPHY CHEST WITH CONTRAST TECHNIQUE: Multidetector CT imaging of the chest was performed using the standard protocol during bolus administration of intravenous contrast. Multiplanar CT image reconstructions and MIPs were obtained to evaluate the vascular anatomy.  RADIATION DOSE REDUCTION: This exam was performed according to the departmental dose-optimization program which includes automated exposure control, adjustment of the mA and/or kV according to patient size and/or use of iterative reconstruction technique. CONTRAST:  19m OMNIPAQUE IOHEXOL 350 MG/ML SOLN COMPARISON:  Chest radiograph earlier today. No prior chest CT FINDINGS: Cardiovascular: There are no filling defects within the pulmonary arteries to suggest pulmonary embolus. Mild aortic atherosclerosis. The heart is normal in size. There are coronary artery calcifications. No pericardial effusion. Mediastinum/Nodes: There is multifocal mediastinal adenopathy. Anterior paratracheal node versus nodal conglomerate measuring 2 cm short axis series 5, image 45. Subcarinal node versus nodal conglomerate measuring 2.8 cm series 5, image 56. There are multiple enlarged right hilar nodes measuring from 14 to 19 mm. There are enlarged left hilar nodes measuring up to 15 mm. There is a 14 mm right epicardial node. The esophagus is decompressed. Lungs/Pleura: Moderate emphysema and bronchial thickening. The right lobar bronchi are attenuated due to adjacent adenopathy. There are multiple pulmonary nodules throughout both lungs of varying sizes. Right perihilar spiculated nodule spanning the fissure measures 4 x 2.3 x 3.6 cm and is cavitating series 6, image 47, series 8, image 77. 2.5 cm left upper lobe nodule corresponds to abnormality on CT. Majority of the additional nodules range from 5-15 mm. Fissural thickening in the left lung. No features of pulmonary edema. There is no endobronchial lesion. No pleural effusion. Upper Abdomen: Limited assessment for focal hepatic lesion given phase of contrast. There is a small soft tissue density medial to the right hepatic lobe posterior to the right kidney series 5, image 127. Right hydronephrosis is partially included in the field of view. Lack of upper abdominal bowel fat limits  detailed assessment. Musculoskeletal: Nondisplaced left anterior seventh and eighth rib fractures. There is some soft tissue thickening adjacent to the seventh rib fracture suggesting this may be pathologic. No chest wall soft tissue abnormalities. Review of the MIP images confirms the above findings. IMPRESSION: 1. No pulmonary embolus. 2. Findings consistent with intrathoracic malignancy. Multiple pulmonary nodules throughout both lungs of varying sizes, consistent with metastatic disease. Primary site of malignancy may represent a 4 cm spiculated nodule in the right perihilar region spanning the fissure that is cavitating. Alternatively all of these nodules may be metastatic given history of cancer, type not specified. 3. Multifocal mediastinal and bilateral hilar adenopathy, consistent metastatic disease. 4. Nondisplaced left anterior seventh and eighth rib fractures, soft tissue thickening adjacent to the seventh rib fracture suggests this may be pathologic 5. Right hydronephrosis is partially included in the field of view in the upper abdomen. Small soft tissue density medial to the right hepatic lobe posterior to the right kidney is nonspecific but may represent metastatic disease. Recommend staging CT of the abdomen and pelvis with oral and IV contrast. 6. Moderate emphysema. 7. Aortic atherosclerosis.  Coronary artery calcifications. Aortic Atherosclerosis (ICD10-I70.0) and Emphysema (ICD10-J43.9). Electronically Signed   By: MAurther LoftD.  On: 01/27/2022 23:03

## 2022-01-29 NOTE — Progress Notes (Signed)
START ON PATHWAY REGIMEN - Uterine     Cycle 1: A cycle is 21 days:     Trastuzumab-xxxx      Paclitaxel      Carboplatin    Cycles 2 through 6: A cycle is every 21 days:     Trastuzumab-xxxx      Paclitaxel      Carboplatin    Cycles 7 and beyond: A cycle is every 21 days:     Trastuzumab-xxxx   **Always confirm dose/schedule in your pharmacy ordering system**  Patient Characteristics: Serous Carcinoma, Recurrent/Progressive Disease, Second Line, Relapse ? 12 Months From Prior Therapy, HER2 Positive Histology: Serous Carcinoma Therapeutic Status: Recurrent or Progressive Disease Line of Therapy: Second Line Time to Recurrence: Relapse ? 12 Months From Prior Therapy HER2 Status: Positive Intent of Therapy: Non-Curative / Palliative Intent, Discussed with Patient

## 2022-01-29 NOTE — Progress Notes (Signed)
  Echocardiogram 2D Echocardiogram has been performed.  Krista [name] 01/29/2022, 10:48 AM

## 2022-01-29 NOTE — Progress Notes (Signed)
[facility] night cross cover note:   I was notified by RN of the patient's blood pressure of 178/122 (MAP 139), not associated with any new complaints, and that the patient does not currently have any scheduled or prn antihypertensive medications ordered.  Heart rates in the 80s.   Per my chart review, this is a 61 year old female who is admitted for pain control in the setting of metastatic endometrial carcinoma.  I have subsequently placed order for prn IV labetalol for systolic blood pressure greater than 263 or diastolic blood pressure greater than 100 mmHg to complement current striving for optimization of pain control for its independent antihypertensive benefits.      Babs Bertin, DO Hospitalist

## 2022-01-29 NOTE — Assessment & Plan Note (Signed)
RD

## 2022-01-30 DIAGNOSIS — R52 Pain, unspecified: Secondary | ICD-10-CM | POA: Diagnosis not present

## 2022-01-30 LAB — CBC WITH DIFFERENTIAL/PLATELET
Abs Immature Granulocytes: 0.02 10*3/uL (ref 0.00–0.07)
Basophils Absolute: 0 10*3/uL (ref 0.0–0.1)
Basophils Relative: 0 %
Eosinophils Absolute: 0.1 10*3/uL (ref 0.0–0.5)
Eosinophils Relative: 2 %
HCT: 36.7 % (ref 36.0–46.0)
Hemoglobin: 11.8 g/dL — ABNORMAL LOW (ref 12.0–15.0)
Immature Granulocytes: 0 %
Lymphocytes Relative: 29 %
Lymphs Abs: 1.6 10*3/uL (ref 0.7–4.0)
MCH: 27 pg (ref 26.0–34.0)
MCHC: 32.2 g/dL (ref 30.0–36.0)
MCV: 84 fL (ref 80.0–100.0)
Monocytes Absolute: 0.7 10*3/uL (ref 0.1–1.0)
Monocytes Relative: 13 %
Neutro Abs: 3 10*3/uL (ref 1.7–7.7)
Neutrophils Relative %: 56 %
Platelets: 256 10*3/uL (ref 150–400)
RBC: 4.37 MIL/uL (ref 3.87–5.11)
RDW: 14.6 % (ref 11.5–15.5)
WBC: 5.5 10*3/uL (ref 4.0–10.5)
nRBC: 0 % (ref 0.0–0.2)

## 2022-01-30 LAB — COMPREHENSIVE METABOLIC PANEL
ALT: 11 U/L (ref 0–44)
AST: 19 U/L (ref 15–41)
Albumin: 2.8 g/dL — ABNORMAL LOW (ref 3.5–5.0)
Alkaline Phosphatase: 45 U/L (ref 38–126)
Anion gap: 9 (ref 5–15)
BUN: 17 mg/dL (ref 6–20)
CO2: 26 mmol/L (ref 22–32)
Calcium: 8.7 mg/dL — ABNORMAL LOW (ref 8.9–10.3)
Chloride: 104 mmol/L (ref 98–111)
Creatinine, Ser: 1.27 mg/dL — ABNORMAL HIGH (ref 0.44–1.00)
GFR, Estimated: 49 mL/min — ABNORMAL LOW (ref >60)
Glucose, Bld: 124 mg/dL — ABNORMAL HIGH (ref 70–99)
Potassium: 4.2 mmol/L (ref 3.5–5.1)
Sodium: 139 mmol/L (ref 135–145)
Total Bilirubin: 0.6 mg/dL (ref 0.3–1.2)
Total Protein: 6.7 g/dL (ref 6.5–8.1)

## 2022-01-30 LAB — LIPID PANEL
Cholesterol: 134 mg/dL (ref 0–200)
HDL: 37 mg/dL — ABNORMAL LOW (ref >40)
LDL Cholesterol: 82 mg/dL (ref 0–99)
Total CHOL/HDL Ratio: 3.6 RATIO
Triglycerides: 77 mg/dL (ref <150)
VLDL: 15 mg/dL (ref 0–40)

## 2022-01-30 LAB — HEMOGLOBIN A1C
Hgb A1c MFr Bld: 8.1 % — ABNORMAL HIGH (ref 4.8–5.6)
Mean Plasma Glucose: 185.77 mg/dL

## 2022-01-30 LAB — GLUCOSE, CAPILLARY
Glucose-Capillary: 163 mg/dL — ABNORMAL HIGH (ref 70–99)
Glucose-Capillary: 122 mg/dL — ABNORMAL HIGH (ref 70–99)
Glucose-Capillary: 158 mg/dL — ABNORMAL HIGH (ref 70–99)
Glucose-Capillary: 189 mg/dL — ABNORMAL HIGH (ref 70–99)

## 2022-01-30 LAB — PHOSPHORUS: Phosphorus: 3.3 mg/dL (ref 2.5–4.6)

## 2022-01-30 LAB — MAGNESIUM: Magnesium: 1.8 mg/dL (ref 1.7–2.4)

## 2022-01-30 NOTE — Progress Notes (Signed)
Chaplain engaged in an initial visit with Krista [name].  Chaplain worked to Biomedical engineer about Krista [name], her support system, and how she is processing since her recent diagnosis.  Krista [name] talked about her healthcare journey and being diagnosed with Ovarian Cancer last year.  She noted that the chemo made her so sick that she only took one round.  When she recently fell and injured herself, she voiced feeling fearful and scared of going to the hospital even though she was in pain.  Though she was scared at first, she has found herself grateful to have fallen which led to finding out that the cancer had spread to different places in her body.   Krista [name] expressed that she desires to live and that she is going to fight.  She vocalized that she was stubborn with her first diagnosis of cancer but now she wants to give it all that she has to continue living.  Chaplain asked Krista [name] what gives her hope and helps her to keep going and she stated, "God."  Krista [name] presented that her faith in God is what she is depending on.    Krista [name] also has a great support system in her mom and sister.  She lives with her mom and speaks to her sister, who lives in [place] everyday.  She has a community of support around her with either daily visitations or phone calls.    Chaplain spent more time learning about integral memories in Krista [name]'s life and about the people who have brought her joy.  Chaplain was intentional about building relationship and rapport with Krista [name] and offering reflective listening.  Chaplain assesses from conversation that Krista [name] does has some fear around the journey ahead of her but is hopeful that she will have more time to be with the people she loves.  Chaplain offered a blessing of peace to Krista [name].     01/30/22 1100  Clinical Encounter Type  Visited With Patient  Visit Type Initial;Spiritual support  Consult/Referral To Chaplain  Stress Factors  Patient Stress Factors Major life changes;Health changes

## 2022-01-30 NOTE — Progress Notes (Signed)
Krista [name]   DOB:[birth year]   MV#:[id number]    ASSESSMENT & PLAN:  Recurrent, metastatic uterine cancer, high-grade serous, HER2 positive disease, MSI stable I told the patient CT finding is compatible with metastatic, stage IV uterine cancer I told her that her disease is now considered incurable The patient had nausea with last cycle of treatment She is keen to receive palliative chemotherapy locally here I reviewed the chest x-ray, port is not in place We will consult IR to remove her port that was placed at [facility] and replace that here I recommend we proceed with chemotherapy with carboplatin and paclitaxel along with Herceptin once her port is fixed It would likely be Tuesday or Wednesday the earliest before I can arrange for her to receive inpatient chemotherapy We discussed the risk, benefits, side effects of chemotherapy and she wants to proceed I plan upfront dose adjustment for paclitaxel due to pre-existing neuropathy   History of severe coronary artery disease, multiple cardiovascular risk factors including diabetes, hypertension and smoking, with elevated serum troponin I am concerned about elevated serum troponin Echocardiogram result showed no signs of cardiomyopathy She was evaluated by cardiologist who recommend we continue on medical management   Right-sided hydronephrosis with mild chronic renal failure She does not need stent placement right now Continue risk factor modification and hydration   History of tobacco use The patient stated she has quit smoking for 2 weeks   Poorly controlled diabetes with peripheral neuropathy She will likely have significant risk and side effects with neuropathy while on treatment We discussed the importance of dietary and lifestyle changes   Medical noncompliance The patient has been noncompliant with follow-up at another health system I told the patient that her disease is terminal and without chemotherapy inconsistent  follow-up, her prognosis is very poor   Moderate protein calorie malnutrition We will get dietitian to see   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 The patient knows to call the clinic with any problems, questions or concerns.   The total time spent in the appointment was 30 minutes encounter with patients including review of chart and various tests results, discussions about plan of care and coordination of care plan  Heath Lark, MD 01/30/2022 7:35 AM  Subjective:  The patient was transferred overnight She continues to have intermittent rib pain  Objective:  Vitals:   01/30/22 0233 01/30/22 0622  BP: (!) 151/92 (!) 147/93  Pulse: 73 76  Resp: 14 16  Temp: 98.8 F (37.1 C) 98.8 F (37.1 C)  SpO2: 98% 98%     Intake/Output Summary (Last 24 hours) at 01/30/2022 0735 Last data filed at 01/29/2022 1837 Gross per 24 hour  Intake 237 ml  Output --  Net 237 ml    GENERAL:alert, no distress and comfortable  NEURO: alert & oriented x 3 with fluent speech, no focal motor/sensory deficits   Labs:  Recent Labs    01/27/22 1709 01/28/22 0608 01/29/22 0046  NA 136 135 138  K 3.8 3.8 4.3  CL 103 101 104  CO2 '24 23 24  ' GLUCOSE 202* 137* 108*  BUN '14 11 18  ' CREATININE 1.17* 1.15* 1.24*  CALCIUM 8.9 8.6* 8.9  GFRNONAA 54* 55* 50*  PROT 7.3 7.0 6.6  ALBUMIN 3.0* 2.9* 2.7*  AST 15 14* 16  ALT '12 11 14  ' ALKPHOS 56 53 52  BILITOT 0.5 0.4 0.4    Studies:  ECHOCARDIOGRAM COMPLETE  Result Date: 01/29/2022    ECHOCARDIOGRAM REPORT   Patient Name:   Krista [name] Date of Exam:  01/29/2022 Medical Rec #:  [id number]        Height:       63.0 in Accession #:    [id number]       Weight:       150.0 lb Date of Birth:  1961-06-28       BSA:          30.711 m Patient Age:    61 years         BP:           132/89 mmHg Patient Gender: F                HR:           70 bpm. Exam Location:  Inpatient Procedure: 2D Echo, Cardiac Doppler and Color Doppler Indications:    Elevated Troponin  History:        Patient has prior history of Echocardiogram examinations, most                 recent 05/13/2019. Previous Myocardial Infarction and CAD; Risk                 Factors:Hypertension and Diabetes.  Sonographer:    Bernadene Person RDCS Referring Phys: [id number] [place]  1. Left ventricular ejection fraction, by estimation, is 55 to 60%. The left ventricle has normal function. The left ventricle has no regional wall motion abnormalities. There is moderate concentric left ventricular hypertrophy. Left ventricular diastolic parameters are consistent with Grade I diastolic dysfunction (impaired relaxation).  2. Right ventricular systolic function is normal. The right ventricular size is normal. There is normal pulmonary artery systolic pressure.  3. Left atrial size was mildly dilated.  4. The mitral valve is normal in structure. Trivial mitral valve regurgitation. No evidence of mitral stenosis.  5. The aortic valve is tricuspid. Aortic valve regurgitation is trivial. No aortic stenosis is present.  6. Aortic dilatation noted. There is mild dilatation of the aortic root, measuring 39 mm.  7. The inferior vena cava is normal in size with <50% respiratory variability, suggesting right atrial pressure of 8 mmHg. FINDINGS  Left Ventricle: Left ventricular ejection fraction, by estimation, is 55 to 60%. The left ventricle has normal function. The left ventricle has no regional wall motion abnormalities. The left ventricular internal cavity size was normal in size. There is  moderate concentric left  ventricular hypertrophy. Left ventricular diastolic parameters are consistent with Grade I diastolic dysfunction (impaired relaxation). Indeterminate filling pressures. Right Ventricle: The right ventricular size is normal. No increase in right ventricular wall thickness. Right ventricular systolic function is normal. There is normal pulmonary artery systolic pressure. The tricuspid regurgitant velocity is 1.46 m/s, and  with an assumed right atrial pressure of 8 mmHg, the estimated right ventricular systolic pressure is 88.3 mmHg. Left Atrium: Left atrial size was mildly dilated. Right Atrium: Right atrial size was normal in size. Pericardium: There is no evidence of pericardial effusion. Mitral Valve: The mitral valve is normal in structure. Trivial mitral valve  regurgitation. No evidence of mitral valve stenosis. Tricuspid Valve: The tricuspid valve is normal in structure. Tricuspid valve regurgitation is trivial. No evidence of tricuspid stenosis. Aortic Valve: The aortic valve is tricuspid. Aortic valve regurgitation is trivial. No aortic stenosis is present. Pulmonic Valve: The pulmonic valve was normal in structure. Pulmonic valve regurgitation is not visualized. No evidence of pulmonic stenosis. Aorta: Aortic dilatation noted. There is mild dilatation of the aortic root, measuring 39 mm. Venous: The inferior vena cava is normal in size with less than 50% respiratory variability, suggesting right atrial pressure of 8 mmHg. IAS/Shunts: No atrial level shunt detected by color flow Doppler.  LEFT VENTRICLE PLAX 2D LVIDd:         4.00 cm     Diastology LVIDs:         2.50 cm     LV e' medial:    4.05 cm/s LV PW:         1.30 cm     LV E/e' medial:  10.4 LV IVS:        1.30 cm     LV e' lateral:   5.45 cm/s LVOT diam:     2.20 cm     LV E/e' lateral: 7.7 LV SV:         85 LV SV Index:   50 LVOT Area:     3.80 cm  LV Volumes (MOD) LV vol d, MOD A2C: 81.8 ml LV vol d, MOD A4C: 90.6 ml LV vol s, MOD A2C: 32.0 ml LV  vol s, MOD A4C: 39.3 ml LV SV MOD A2C:     49.8 ml LV SV MOD A4C:     90.6 ml LV SV MOD BP:      52.9 ml RIGHT VENTRICLE RV S prime:     14.30 cm/s TAPSE (M-mode): 2.1 cm LEFT ATRIUM             Index        RIGHT ATRIUM           Index LA diam:        3.70 cm 2.16 cm/m   RA Area:     15.60 cm LA Vol (A2C):   55.3 ml 32.32 ml/m  RA Volume:   38.90 ml  22.73 ml/m LA Vol (A4C):   47.9 ml 27.99 ml/m LA Biplane Vol: 53.4 ml 31.21 ml/m  AORTIC VALVE LVOT Vmax:   114.00 cm/s LVOT Vmean:  73.300 cm/s LVOT VTI:    0.223 m  AORTA Ao Root diam: 3.90 cm MITRAL VALVE               TRICUSPID VALVE MV Area (PHT): 2.00 cm    TR Peak grad:   8.5 mmHg MV Decel Time: 380 msec    TR Vmax:        146.00 cm/s MV E velocity: 42.20 cm/s MV A velocity: 79.50 cm/s  SHUNTS MV E/A ratio:  0.53        Systemic VTI:  0.22 m                            Systemic Diam: 2.20 cm Skeet Latch MD Electronically signed by Skeet Latch MD Signature Date/Time: 01/29/2022/10:57:38 AM    Final    DG Chest 2 View  Addendum Date: 01/28/2022   ADDENDUM REPORT: 01/28/2022 15:05 ADDENDUM: Tip of right chest port is seen in the region of right innominate vein. Electronically  Signed   By: Elmer Picker M.D.   On: 01/28/2022 15:05   Result Date: 01/28/2022 CLINICAL DATA:  Shortness of breath, cough EXAM: CHEST - 2 VIEW COMPARISON:  Previous studies including the examination of 05/14/2019 FINDINGS: Cardiac size is within normal limits. Thoracic aorta is tortuous. Right hilum appears more prominent. There is 2.8 cm smooth marginated nodule in the left upper lung fields adjacent to the left hilum.Rest of the lung fields are essentially clear. There is no pleural effusion or pneumothorax. IMPRESSION: There is 2.8 cm nodular density in the left parahilar region. Possibility of malignant neoplasm is not excluded. Follow-up CT chest may be considered. Right hilum appears more prominent. This may be due to prominent central pulmonary vessels or  lymphadenopathy. Electronically Signed: By: Elmer Picker M.D. On: 01/27/2022 17:45   CT ABDOMEN PELVIS W CONTRAST  Result Date: 01/28/2022 CLINICAL DATA:  Evaluate for metastatic disease. EXAM: CT ABDOMEN AND PELVIS WITH CONTRAST TECHNIQUE: Multidetector CT imaging of the abdomen and pelvis was performed using the standard protocol following bolus administration of intravenous contrast. RADIATION DOSE REDUCTION: This exam was performed according to the departmental dose-optimization program which includes automated exposure control, adjustment of the mA and/or kV according to patient size and/or use of iterative reconstruction technique. CONTRAST:  137m OMNIPAQUE IOHEXOL 300 MG/ML  SOLN COMPARISON:  CT abdomen pelvis dated 09/22/2020. chest CT dated 01/27/2022. FINDINGS: Lower chest: Multiple pulmonary nodules in the visualized right lung base consistent with metastatic disease. Partially visualized right perihilar nodular densities (1/3) corresponding to the right hilar mass or lymphadenopathy. These findings are better evaluated on the chest CT of 01/27/2022. No intra-abdominal free air.  Small free fluid in the pelvis. Hepatobiliary: Faint small nodular deposit along the liver capsule in the right lobe of the liver (12/3) measuring up to 12 mm most consistent with metastatic implant. No intrahepatic biliary ductal dilatation. The gallbladder is unremarkable. Pancreas: The pancreas is unremarkable. No active inflammatory changes. No dilatation of the main pancreatic duct. Spleen: The spleen is unremarkable. Adrenals/Urinary Tract: The adrenal glands are unremarkable. There is moderate right hydronephrosis and mild right hydroureter secondary to compression and obstruction of the distal right ureter by a metastatic lesion in the right hemipelvis. The left kidney is unremarkable. The urinary bladder is minimally distended and grossly unremarkable. Stomach/Bowel: There is no bowel obstruction or active  inflammation. The appendix is normal. Vascular/Lymphatic: Moderate aortoiliac atherosclerotic disease. The IVC is unremarkable. No portal venous gas. No retroperitoneal adenopathy. Reproductive: Hysterectomy. Other: Several pelvic masses consistent with metastatic disease. The masses demonstrate central low attenuation, likely necrotic tissue. The largest measuring 4.4 x 3.5 x 4.7 cm in the right hemipelvis. This mass causes compression of the distal right ureter with associated right-sided hydronephrosis. Additional metastatic implants in the omentum and in the left upper abdomen measure up to 4.3 x 3.3 cm in axial dimensions (42/3). There is a 4.5 x 2.3 cm metastatic implant in the right lateral abdominal wall musculature (45/3). Faint 1.4 x 1.0 cm nodular density over the left diaphragmatic crus (22/3) suspicious for metastatic implant. Musculoskeletal: There is osteopenia with degenerative changes of the spine. No acute osseous pathology. No suspicious bone lesions. IMPRESSION: 1. Multiple abdominal and pelvic metastatic implants as above. 2. Moderate right hydronephrosis secondary to mass effect and obstruction of the distal right ureter by a right pelvic metastatic implant. 3. Multiple pulmonary metastatic disease in the visualized right lung base. 4. No bowel obstruction. Normal appendix. 5. Aortic Atherosclerosis (ICD10-I70.0). Electronically  Signed   By: Anner Crete M.D.   On: 01/28/2022 03:41   CT Angio Chest PE W and/or Wo Contrast  Result Date: 01/27/2022 CLINICAL DATA:  Pulmonary embolism (PE) suspected, high prob History of cancer, pleuritic left-sided chest pain, short of breath. Abnormality on chest x-ray CT recommended. Rule out PE versus cancer versus pneumonia versus soft tissue injury EXAM: CT ANGIOGRAPHY CHEST WITH CONTRAST TECHNIQUE: Multidetector CT imaging of the chest was performed using the standard protocol during bolus administration of intravenous contrast. Multiplanar CT image  reconstructions and MIPs were obtained to evaluate the vascular anatomy. RADIATION DOSE REDUCTION: This exam was performed according to the departmental dose-optimization program which includes automated exposure control, adjustment of the mA and/or kV according to patient size and/or use of iterative reconstruction technique. CONTRAST:  87m OMNIPAQUE IOHEXOL 350 MG/ML SOLN COMPARISON:  Chest radiograph earlier today. No prior chest CT FINDINGS: Cardiovascular: There are no filling defects within the pulmonary arteries to suggest pulmonary embolus. Mild aortic atherosclerosis. The heart is normal in size. There are coronary artery calcifications. No pericardial effusion. Mediastinum/Nodes: There is multifocal mediastinal adenopathy. Anterior paratracheal node versus nodal conglomerate measuring 2 cm short axis series 5, image 45. Subcarinal node versus nodal conglomerate measuring 2.8 cm series 5, image 56. There are multiple enlarged right hilar nodes measuring from 14 to 19 mm. There are enlarged left hilar nodes measuring up to 15 mm. There is a 14 mm right epicardial node. The esophagus is decompressed. Lungs/Pleura: Moderate emphysema and bronchial thickening. The right lobar bronchi are attenuated due to adjacent adenopathy. There are multiple pulmonary nodules throughout both lungs of varying sizes. Right perihilar spiculated nodule spanning the fissure measures 4 x 2.3 x 3.6 cm and is cavitating series 6, image 47, series 8, image 77. 2.5 cm left upper lobe nodule corresponds to abnormality on CT. Majority of the additional nodules range from 5-15 mm. Fissural thickening in the left lung. No features of pulmonary edema. There is no endobronchial lesion. No pleural effusion. Upper Abdomen: Limited assessment for focal hepatic lesion given phase of contrast. There is a small soft tissue density medial to the right hepatic lobe posterior to the right kidney series 5, image 127. Right hydronephrosis is  partially included in the field of view. Lack of upper abdominal bowel fat limits detailed assessment. Musculoskeletal: Nondisplaced left anterior seventh and eighth rib fractures. There is some soft tissue thickening adjacent to the seventh rib fracture suggesting this may be pathologic. No chest wall soft tissue abnormalities. Review of the MIP images confirms the above findings. IMPRESSION: 1. No pulmonary embolus. 2. Findings consistent with intrathoracic malignancy. Multiple pulmonary nodules throughout both lungs of varying sizes, consistent with metastatic disease. Primary site of malignancy may represent a 4 cm spiculated nodule in the right perihilar region spanning the fissure that is cavitating. Alternatively all of these nodules may be metastatic given history of cancer, type not specified. 3. Multifocal mediastinal and bilateral hilar adenopathy, consistent metastatic disease. 4. Nondisplaced left anterior seventh and eighth rib fractures, soft tissue thickening adjacent to the seventh rib fracture suggests this may be pathologic 5. Right hydronephrosis is partially included in the field of view in the upper abdomen. Small soft tissue density medial to the right hepatic lobe posterior to the right kidney is nonspecific but may represent metastatic disease. Recommend staging CT of the abdomen and pelvis with oral and IV contrast. 6. Moderate emphysema. 7. Aortic atherosclerosis.  Coronary artery calcifications. Aortic Atherosclerosis (ICD10-I70.0) and Emphysema (  ICD10-J43.9). Electronically Signed   By: Keith Rake M.D.   On: 01/27/2022 23:03

## 2022-01-30 NOTE — Progress Notes (Signed)
PROGRESS NOTE Krista [name]  VZC:[id number] DOB: 1961/06/20 DOA: 01/27/2022 PCP: Drue Flirt, MD   Brief Narrative/Hospital Course: 61 y.o.f w/ history significant for endometrial carcinoma, coronary artery disease, prior MI, type 2 diabetes, hyperlipidemia, hypertension, chronic anxiety/depression, former tobacco use disorder quit 2 weeks ago, presented to [facility] ED from home due to left-sided chest pain after falling on her left side 1 week ago ,w/ associated 1 episode of hemoptysis 3 weeks ago and a productive cough of yellow sputum.  Imaging shows multiple pulm nodules, multifocal mediastinal and bilateral hilar adenopathy c/w metastatic disease and multiple abdominal and pelvic implants. She is admitted for pain control related to rib fractures and her metastatic disease, followed by hematology oncology, cardiology.  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    Subjective: Seen and examined this morning, pain is controlled it is mostly in the left side Looking forward for port placement and chemo soon.     Assessment and Plan: Principal Problem:   Generalized pain Active Problems:   Cancer related pain   Rib fractures   Metastatic disease ([place])   Endometrial cancer ([name])   Hydronephrosis of right kidney   Hemoptysis   Elevated troponin   Cough   Elevated serum creatinine   Protein-calorie malnutrition, moderate ([name])   TOBACCO ABUSE   Type 2 diabetes mellitus ([name])   Goals of care, counseling/discussion   Essential hypertension   Recurrent metastatic uterine cancer high-grade serous, H ER 2 positive disease: Cancer related pain Pathological rib fracture due to fall in the setting of metastasis  Hemoptysis due to metastatic disease 3 weeks ago: Dr. Lottie Rater is following closely, based on CT scan has metastatic stage IV uterine cancer and now considered incurable.  Plan is to continue aggressive  pain control-oral/IV opiates, lidocaine patch, NSAIDs/Tylenol PRN, antiemetics, palliative chemotherapy in hours, port being planned today IR consulted.  Will likely get chemo Tuesday or Wednesday.  Hydronephrosis of right kidney:Related to metastatic disease. Dr Bjorn Pippin discussed with urology on call, given no R sided flank pain, creatinine not significantly elevated advised outpatient follow-up with [facility]  Elevated troponin Severe coronary artery disease history with multiple cardiovascular structures: LHC in 12/2018 with severe CAD s/p DES PCI to LAD lesion.  Currently no chest pain had elevated troponin without acute EKG finding echocardiogram obtained-no acute finding risk of invasive work-up at this time outweighs the benefits given plan for chemotherapy.  Plan is to continue aspirin and Plavix Repatha encourage smoking cessation, continue blood pressure control  Cough: Due to pulmonary metastasis continue cough meds as needed. At risk of malnutrition consult dietitian Elevated serum creatinine: Holding at 1.1-1.2.  Unclear recent baseline monitor Recent Labs  Lab 01/27/22 1709 01/28/22 0608 01/29/22 0046 01/30/22 0429  BUN '14 11 18 17  '$ CREATININE 1.17* 1.15* 1.24* 1.27*    Type 2 diabetes mellitus with neuropathy, continue SSI.  A1c poorly controlled at 8.2 Recent Labs  Lab 01/28/22 0608 01/28/22 0917 01/29/22 1108 01/29/22 1607 01/29/22 2159 01/30/22 0801 01/30/22 1228  GLUCAP  --    < > 212* 190* 195* 163* 122*  HGBA1C 8.2*  --   --   --   --   --   --    < > = values in this interval not displayed.   Tobacco abuse quit 2 weeks ago. GOC: Remains full code await further chemo plan I will discuss by oncology  DVT prophylaxis: enoxaparin (  LOVENOX) injection 40 mg Start: 01/28/22 1000 Code Status:   Code Status: DNR Family Communication: plan of care discussed with patien at bedside. Patient status is: Inpatient because of plan for inpatient chemotherapy Level of  care: Telemetry   Dispo: The patient is from: HOME            Anticipated disposition: HOME after chemotherapy once cleared by oncology later this week  Mobility Assessment (last 72 hours)     Mobility Assessment     Row Name 01/29/22 1835 01/29/22 0715 01/28/22 1921 01/28/22 0900     Does patient have an order for bedrest or is patient medically unstable No - Continue assessment No - Continue assessment No - Continue assessment No - Continue assessment    What is the highest level of mobility based on the progressive mobility assessment? -- Level 6 (Walks independently in room and hall) - Balance while walking in room without assist - Complete Level 6 (Walks independently in room and hall) - Balance while walking in room without assist - Complete --              Objective: Vitals last 24 hrs: Vitals:   01/29/22 2237 01/29/22 2330 01/30/22 0233 01/30/22 0622  BP: (!) 181/103 (!) 129/93 (!) 151/92 (!) 147/93  Pulse: 89 85 73 76  Resp: '16  14 16  '$ Temp: 98.6 F (37 C)  98.8 F (37.1 C) 98.8 F (37.1 C)  TempSrc: Oral  Oral Oral  SpO2: 97%  98% 98%  Weight:      Height:       Weight change:   Physical Examination: General exam: alert awake,older than stated age, weak appearing. HEENT:Oral mucosa moist, Ear/Nose WNL grossly, dentition normal. Respiratory system: bilaterally CLEAR BS, no use of accessory muscle Cardiovascular system: S1 & S2 +, No JVD. Gastrointestinal system: Abdomen soft,NT,ND, BS+ Nervous System:Alert, awake, moving extremities and grossly nonfocal Extremities: LE edema NEG,distal peripheral pulses palpable.  Skin: No rashes,no icterus. MSK: Normal muscle bulk,tone, power  Medications reviewed:  Scheduled Meds:  acetaminophen  1,000 mg Oral Q8H   amLODipine  5 mg Oral Daily   aspirin  81 mg Oral Daily   clopidogrel  75 mg Oral Daily   enoxaparin (LOVENOX) injection  40 mg Subcutaneous Q24H   guaiFENesin  600 mg Oral BID   insulin aspart  0-5  Units Subcutaneous QHS   insulin aspart  0-9 Units Subcutaneous TID WC   lidocaine  2 patch Transdermal Q24H   metoprolol tartrate  25 mg Oral BID   senna-docusate  1 tablet Oral QHS   Continuous Infusions:  lactated ringers 100 mL/hr at 01/30/22 0554      Diet Order             Diet regular Room service appropriate? Yes; Fluid consistency: Thin  Diet effective now                            Intake/Output Summary (Last 24 hours) at 01/30/2022 1329 Last data filed at 01/30/2022 1240 Gross per 24 hour  Intake 777 ml  Output 1600 ml  Net -823 ml   Net IO Since Admission: 645.2 mL [01/30/22 1329]  Wt Readings from Last 3 Encounters:  01/27/22 68 kg  10/12/20 69.9 kg  04/09/20 81.4 kg     Unresulted Labs (From admission, onward)     Start     Ordered   02/04/22 0500  Creatinine,  serum  (enoxaparin (LOVENOX)    CrCl >/= 30 ml/min)  Weekly,   R     Comments: while on enoxaparin therapy    01/28/22 0543   01/30/22 0429  Hemoglobin A1c  Once,   R        01/30/22 0429   01/29/22 0500  CA 125  Tomorrow morning,   R        01/28/22 1321   Unscheduled  CBC with Differential ([place])  STAT      01/29/22 1416   Unscheduled  CMP ([place] only)  STAT      01/29/22 1416          Data Reviewed: I have personally reviewed following labs and imaging studies CBC: Recent Labs  Lab 01/27/22 1709 01/28/22 0608 01/29/22 0046 01/30/22 0429  WBC 7.3 6.9 4.8 5.5  NEUTROABS 4.0 3.8 2.3 3.0  HGB 12.7 12.4 11.6* 11.8*  HCT 39.7 36.7 35.8* 36.7  MCV 85.4 82.8 84.0 84.0  PLT 323 274 260 253   Basic Metabolic Panel: Recent Labs  Lab 01/27/22 1709 01/28/22 0608 01/29/22 0046 01/30/22 0429  NA 136 135 138 139  K 3.8 3.8 4.3 4.2  CL 103 101 104 104  CO2 '24 23 24 26  '$ GLUCOSE 202* 137* 108* 124*  BUN '14 11 18 17  '$ CREATININE 1.17* 1.15* 1.24* 1.27*  CALCIUM 8.9 8.6* 8.9 8.7*  MG  --  1.8 1.9 1.8  PHOS  --  3.8 4.0 3.3   GFR: Estimated Creatinine  Clearance: 44.7 mL/min (A) (by C-G formula based on SCr of 1.27 mg/dL (H)). Liver Function Tests: Recent Labs  Lab 01/27/22 1709 01/28/22 0608 01/29/22 0046 01/30/22 0429  AST 15 14* 16 19  ALT '12 11 14 11  '$ ALKPHOS 56 53 52 45  BILITOT 0.5 0.4 0.4 0.6  PROT 7.3 7.0 6.6 6.7  ALBUMIN 3.0* 2.9* 2.7* 2.8*   Recent Labs  Lab 01/27/22 2133  LIPASE 56*   No results for input(s): "AMMONIA" in the last 168 hours. Coagulation Profile: No results for input(s): "INR", "PROTIME" in the last 168 hours. BNP (last 3 results) No results for input(s): "PROBNP" in the last 8760 hours. HbA1C: Recent Labs    01/28/22 0608  HGBA1C 8.2*   CBG: Recent Labs  Lab 01/29/22 1108 01/29/22 1607 01/29/22 2159 01/30/22 0801 01/30/22 1228  GLUCAP 212* 190* 195* 163* 122*   Lipid Profile: Recent Labs    01/30/22 0429  CHOL 134  HDL 37*  LDLCALC 82  TRIG 77  CHOLHDL 3.6   Thyroid Function Tests: No results for input(s): "TSH", "T4TOTAL", "FREET4", "T3FREE", "THYROIDAB" in the last 72 hours. Sepsis Labs: No results for input(s): "PROCALCITON", "LATICACIDVEN" in the last 168 hours.  Recent Results (from the past 240 hour(s))  MRSA Next Gen by PCR, Nasal     Status: None   Collection Time: 01/28/22 10:05 AM   Specimen: Nasal Mucosa; Nasal Swab  Result Value Ref Range Status   MRSA by PCR Next Gen NOT DETECTED NOT DETECTED Final    Comment: (NOTE) The GeneXpert MRSA Assay (FDA approved for NASAL specimens only), is one component of a comprehensive MRSA colonization surveillance program. It is not intended to diagnose MRSA infection nor to guide or monitor treatment for MRSA infections. Test performance is not FDA approved in patients less than 43 years old. Performed at [facility], [place] [address] [place], [place] [address]     Antimicrobials: Anti-infectives (  From admission, onward)    None      Culture/Microbiology    Component Value Date/Time   SDES URINE, CLEAN  CATCH 05/13/2019 1348   SPECREQUEST  05/13/2019 1348    NONE Performed at [facility], [place] [address], [place], [place] [address]    CULT MULTIPLE SPECIES PRESENT, SUGGEST RECOLLECTION (A) 05/13/2019 1348   REPTSTATUS 05/14/2019 FINAL 05/13/2019 1348    Other culture-see note Radiology Studies: ECHOCARDIOGRAM COMPLETE  Result Date: 01/29/2022    ECHOCARDIOGRAM REPORT   Patient Name:   KENYONA RENA [facility] Date of Exam: 01/29/2022 Medical Rec #:  [id number]        Height:       63.0 in Accession #:    [id number]       Weight:       150.0 lb Date of Birth:  1961/04/05       BSA:          4.711 m Patient Age:    55 years         BP:           132/89 mmHg Patient Gender: F                HR:           70 bpm. Exam Location:  Inpatient Procedure: 2D Echo, Cardiac Doppler and Color Doppler Indications:    Elevated Troponin  History:        Patient has prior history of Echocardiogram examinations, most                 recent 05/13/2019. Previous Myocardial Infarction and CAD; Risk                 Factors:Hypertension and Diabetes.  Sonographer:    Bernadene Person RDCS Referring Phys: [id number] [place]  1. Left ventricular ejection fraction, by estimation, is 55 to 60%. The left ventricle has normal function. The left ventricle has no regional wall motion abnormalities. There is moderate concentric left ventricular hypertrophy. Left ventricular diastolic parameters are consistent with Grade I diastolic dysfunction (impaired relaxation).  2. Right ventricular systolic function is normal. The right ventricular size is normal. There is normal pulmonary artery systolic pressure.  3. Left atrial size was mildly dilated.  4. The mitral valve is normal in structure. Trivial mitral valve regurgitation. No evidence of mitral stenosis.  5. The aortic valve is tricuspid. Aortic valve regurgitation is trivial. No aortic stenosis is present.  6. Aortic dilatation noted. There is mild dilatation of the aortic  root, measuring 39 mm.  7. The inferior vena cava is normal in size with <50% respiratory variability, suggesting right atrial pressure of 8 mmHg. FINDINGS  Left Ventricle: Left ventricular ejection fraction, by estimation, is 55 to 60%. The left ventricle has normal function. The left ventricle has no regional wall motion abnormalities. The left ventricular internal cavity size was normal in size. There is  moderate concentric left ventricular hypertrophy. Left ventricular diastolic parameters are consistent with Grade I diastolic dysfunction (impaired relaxation). Indeterminate filling pressures. Right Ventricle: The right ventricular size is normal. No increase in right ventricular wall thickness. Right ventricular systolic function is normal. There is normal pulmonary artery systolic pressure. The tricuspid regurgitant velocity is 1.46 m/s, and  with an assumed right atrial pressure of 8 mmHg, the estimated right ventricular systolic pressure is 12.4 mmHg. Left Atrium: Left atrial size was mildly dilated. Right Atrium: Right atrial size was  normal in size. Pericardium: There is no evidence of pericardial effusion. Mitral Valve: The mitral valve is normal in structure. Trivial mitral valve regurgitation. No evidence of mitral valve stenosis. Tricuspid Valve: The tricuspid valve is normal in structure. Tricuspid valve regurgitation is trivial. No evidence of tricuspid stenosis. Aortic Valve: The aortic valve is tricuspid. Aortic valve regurgitation is trivial. No aortic stenosis is present. Pulmonic Valve: The pulmonic valve was normal in structure. Pulmonic valve regurgitation is not visualized. No evidence of pulmonic stenosis. Aorta: Aortic dilatation noted. There is mild dilatation of the aortic root, measuring 39 mm. Venous: The inferior vena cava is normal in size with less than 50% respiratory variability, suggesting right atrial pressure of 8 mmHg. IAS/Shunts: No atrial level shunt detected by color flow  Doppler.  LEFT VENTRICLE PLAX 2D LVIDd:         4.00 cm     Diastology LVIDs:         2.50 cm     LV e' medial:    4.05 cm/s LV PW:         1.30 cm     LV E/e' medial:  10.4 LV IVS:        1.30 cm     LV e' lateral:   5.45 cm/s LVOT diam:     2.20 cm     LV E/e' lateral: 7.7 LV SV:         85 LV SV Index:   50 LVOT Area:     3.80 cm  LV Volumes (MOD) LV vol d, MOD A2C: 81.8 ml LV vol d, MOD A4C: 90.6 ml LV vol s, MOD A2C: 32.0 ml LV vol s, MOD A4C: 39.3 ml LV SV MOD A2C:     49.8 ml LV SV MOD A4C:     90.6 ml LV SV MOD BP:      52.9 ml RIGHT VENTRICLE RV S prime:     14.30 cm/s TAPSE (M-mode): 2.1 cm LEFT ATRIUM             Index        RIGHT ATRIUM           Index LA diam:        3.70 cm 2.16 cm/m   RA Area:     15.60 cm LA Vol (A2C):   55.3 ml 32.32 ml/m  RA Volume:   38.90 ml  22.73 ml/m LA Vol (A4C):   47.9 ml 27.99 ml/m LA Biplane Vol: 53.4 ml 31.21 ml/m  AORTIC VALVE LVOT Vmax:   114.00 cm/s LVOT Vmean:  73.300 cm/s LVOT VTI:    0.223 m  AORTA Ao Root diam: 3.90 cm MITRAL VALVE               TRICUSPID VALVE MV Area (PHT): 2.00 cm    TR Peak grad:   8.5 mmHg MV Decel Time: 380 msec    TR Vmax:        146.00 cm/s MV E velocity: 42.20 cm/s MV A velocity: 79.50 cm/s  SHUNTS MV E/A ratio:  0.53        Systemic VTI:  0.22 m                            Systemic Diam: 2.20 cm Skeet Latch MD Electronically signed by Skeet Latch MD Signature Date/Time: 01/29/2022/10:57:38 AM    Final      LOS: 2 days  Antonieta Pert, MD [facility]  01/30/2022, 1:29 PM

## 2022-01-30 NOTE — Progress Notes (Signed)
Plans to start chemotherapy after port placement tomorrow per Dr. Alvy Bimler. Education provided on Carboplatin and Paclitaxel per treatment plan. All questions answered at this time.

## 2022-01-30 NOTE — Consult Note (Signed)
Chief Complaint: Existing portacath malfunction. Request is for portacath removal and new portacath placement.   Referring Physician(s): Dr. Natale Lay  Supervising Physician: Markus Daft  Patient Status: [facility] - In-pt  History of Present Illness: Krista [name] is a 61 y.o. female inpatient. Former smoker. History of CAD,MI DM, HTN, uterine cancer. Presented to the ED at [facility] with left sided chest pain that occurred after a fall approximately 1 week ago and 1 episode of hemoptysis. Found to have rib fractures and metastatic disease. Patient was transported to [facility] for chemotherapy Patient has a right sided single portacath placed at a OSH. Chest xray from 6.17.23 shows the tip in the region of the right innominate. Team is requesting a portacath removal and portacath placement.   Currently without any significant complaints. Patient alert and laying in bed\. Denies any fevers, headache, chest pain, SOB, cough, abdominal pain, nausea, vomiting or bleeding. Return precautions and treatment recommendations and follow-up discussed with the patient  who is agreeable with the plan.    Past Medical History:  Diagnosis Date   Arthritis    CAD (coronary artery disease)    Depression    Diabetes mellitus    Hypertension    NSTEMI (non-ST elevated myocardial infarction) [facility])     Past Surgical History:  Procedure Laterality Date   ANKLE SURGERY     left ankle - was broken   CORONARY STENT INTERVENTION N/A 12/16/2018   Procedure: CORONARY STENT INTERVENTION;  Surgeon: Leonie Man, MD;  Location: [place] [facility];  Service: Cardiovascular;  Laterality: N/A;  LAD   LEFT HEART CATH AND CORONARY ANGIOGRAPHY N/A 12/16/2018   Procedure: LEFT HEART CATH AND CORONARY ANGIOGRAPHY;  Surgeon: Leonie Man, MD;  Location: [place] [facility];  Service: Cardiovascular;  Laterality: N/A;    Allergies: Statins and Zetia [ezetimibe]  Medications: Prior to Admission medications   Medication Sig  Start Date End Date Taking? Authorizing Provider  acetaminophen (TYLENOL) 500 MG tablet Take 1,000 mg by mouth every 6 (six) hours as needed for mild pain or headache.   Yes [provider]  aspirin EC 81 MG EC tablet Take 1 tablet (81 mg total) by mouth daily. Patient not taking: Reported on 01/28/2022 05/17/19   Myles Gip, DO  clopidogrel (PLAVIX) 75 MG tablet Take 1 tablet (75 mg total) by mouth daily. Patient not taking: Reported on 01/28/2022 09/05/19   Lorretta Harp, MD  diltiazem (DILACOR XR) 180 MG 24 hr capsule Take 2 capsules (360 mg total) by mouth daily. Patient not taking: Reported on 01/28/2022 04/09/20   Almyra Deforest, PA  doxycycline (VIBRAMYCIN) 100 MG capsule Take 1 capsule (100 mg total) by mouth 2 (two) times daily. One po bid x 7 days Patient not taking: Reported on 01/28/2022 09/23/20   Domenic Moras, PA-C  doxycycline (VIBRAMYCIN) 100 MG capsule Take 1 capsule (100 mg total) by mouth 2 (two) times daily. One po bid x 7 days Patient not taking: Reported on 01/28/2022 09/23/20   Domenic Moras, PA-C  Evolocumab (REPATHA SURECLICK) 366 MG/ML SOAJ Inject 140 mg into the skin every 14 (fourteen) days. Patient not taking: Reported on 01/28/2022 11/10/19   Lorretta Harp, MD  loratadine (CLARITIN) 10 MG tablet Take 1 tablet (10 mg total) by mouth daily as needed for allergies. Patient not taking: Reported on 01/28/2022 10/28/19   Lorretta Harp, MD  metFORMIN (GLUCOPHAGE) 1000 MG tablet Take 1,000 mg by mouth 2 (two) times daily  with a meal. Patient not taking: Reported on 01/28/2022 04/29/19   [provider]  nitroGLYCERIN (NITROSTAT) 0.4 MG SL tablet Place 1 tablet (0.4 mg total) under the tongue every 5 (five) minutes as needed for chest pain. Patient not taking: Reported on 01/28/2022 10/28/19   Lorretta Harp, MD     Family History  Problem Relation Age of Onset   Diabetes Mother    COPD Mother    Hypertension Mother    Diabetes Father    Hypertension  Father     Social History   Socioeconomic History   Marital status: Single    Spouse name: Not on file   Number of children: Not on file   Years of education: Not on file   Highest education level: Not on file  Occupational History   Not on file  Tobacco Use   Smoking status: Former    Packs/day: 0.50    Years: 33.00    Total pack years: 16.50    Types: Cigarettes    Quit date: 01/08/2022    Years since quitting: 0.0   Smokeless tobacco: Never  Vaping Use   Vaping Use: Never used  Substance and Sexual Activity   Alcohol use: No   Drug use: No   Sexual activity: Yes    Birth control/protection: Post-menopausal  Other Topics Concern   Not on file  Social History Narrative   Not on file   Social Determinants of Health   Financial Resource Strain: Not on file  Food Insecurity: Not on file  Transportation Needs: No Transportation Needs (12/13/2020)   PRAPARE - Hydrologist (Medical): No    Lack of Transportation (Non-Medical): No  Physical Activity: Not on file  Stress: Not on file  Social Connections: Not on file     Review of Systems: A 12 point ROS discussed and pertinent positives are indicated in the HPI above.  All other systems are negative.  Review of Systems  Constitutional:  Negative for fatigue and fever.  HENT:  Negative for congestion.   Respiratory:  Negative for cough and shortness of breath.   Gastrointestinal:  Negative for abdominal pain, diarrhea, nausea and vomiting.    Vital Signs: BP (!) 147/93 (BP Location: Right Arm)   Pulse 76   Temp 98.8 F (37.1 C) (Oral)   Resp 16   Ht '5\' 3"'$  (1.6 m)   Wt 150 lb (68 kg)   SpO2 98%   BMI 26.57 kg/m     Physical Exam Vitals and nursing note reviewed.  Constitutional:      Appearance: She is well-developed.  HENT:     Head: Normocephalic and atraumatic.  Eyes:     Conjunctiva/sclera: Conjunctivae normal.  Cardiovascular:     Rate and Rhythm: Normal rate and  regular rhythm.     Heart sounds: Normal heart sounds.  Pulmonary:     Effort: Pulmonary effort is normal.  Musculoskeletal:        General: Normal range of motion.     Cervical back: Normal range of motion.  Skin:    General: Skin is warm.  Neurological:     Mental Status: She is alert and oriented to person, place, and time.     Imaging: ECHOCARDIOGRAM COMPLETE  Result Date: 01/29/2022    ECHOCARDIOGRAM REPORT   Patient Name:   NOAH PELAEZ Date of Exam: 01/29/2022 Medical Rec #:  [id number]        Height:  63.0 in Accession #:    [id number]       Weight:       150.0 lb Date of Birth:  04-29-1961       BSA:          61.711 m Patient Age:    76 years         BP:           132/89 mmHg Patient Gender: F                HR:           70 bpm. Exam Location:  Inpatient Procedure: 2D Echo, Cardiac Doppler and Color Doppler Indications:    Elevated Troponin  History:        Patient has prior history of Echocardiogram examinations, most                 recent 05/13/2019. Previous Myocardial Infarction and CAD; Risk                 Factors:Hypertension and Diabetes.  Sonographer:    Bernadene Person RDCS Referring Phys: [id number] [place]  1. Left ventricular ejection fraction, by estimation, is 55 to 60%. The left ventricle has normal function. The left ventricle has no regional wall motion abnormalities. There is moderate concentric left ventricular hypertrophy. Left ventricular diastolic parameters are consistent with Grade I diastolic dysfunction (impaired relaxation).  2. Right ventricular systolic function is normal. The right ventricular size is normal. There is normal pulmonary artery systolic pressure.  3. Left atrial size was mildly dilated.  4. The mitral valve is normal in structure. Trivial mitral valve regurgitation. No evidence of mitral stenosis.  5. The aortic valve is tricuspid. Aortic valve regurgitation is trivial. No aortic stenosis is present.  6. Aortic dilatation  noted. There is mild dilatation of the aortic root, measuring 39 mm.  7. The inferior vena cava is normal in size with <50% respiratory variability, suggesting right atrial pressure of 8 mmHg. FINDINGS  Left Ventricle: Left ventricular ejection fraction, by estimation, is 55 to 60%. The left ventricle has normal function. The left ventricle has no regional wall motion abnormalities. The left ventricular internal cavity size was normal in size. There is  moderate concentric left ventricular hypertrophy. Left ventricular diastolic parameters are consistent with Grade I diastolic dysfunction (impaired relaxation). Indeterminate filling pressures. Right Ventricle: The right ventricular size is normal. No increase in right ventricular wall thickness. Right ventricular systolic function is normal. There is normal pulmonary artery systolic pressure. The tricuspid regurgitant velocity is 1.46 m/s, and  with an assumed right atrial pressure of 8 mmHg, the estimated right ventricular systolic pressure is 54.5 mmHg. Left Atrium: Left atrial size was mildly dilated. Right Atrium: Right atrial size was normal in size. Pericardium: There is no evidence of pericardial effusion. Mitral Valve: The mitral valve is normal in structure. Trivial mitral valve regurgitation. No evidence of mitral valve stenosis. Tricuspid Valve: The tricuspid valve is normal in structure. Tricuspid valve regurgitation is trivial. No evidence of tricuspid stenosis. Aortic Valve: The aortic valve is tricuspid. Aortic valve regurgitation is trivial. No aortic stenosis is present. Pulmonic Valve: The pulmonic valve was normal in structure. Pulmonic valve regurgitation is not visualized. No evidence of pulmonic stenosis. Aorta: Aortic dilatation noted. There is mild dilatation of the aortic root, measuring 39 mm. Venous: The inferior vena cava is normal in size with less than 50% respiratory variability, suggesting right atrial pressure of  8 mmHg. IAS/Shunts:  No atrial level shunt detected by color flow Doppler.  LEFT VENTRICLE PLAX 2D LVIDd:         4.00 cm     Diastology LVIDs:         2.50 cm     LV e' medial:    4.05 cm/s LV PW:         1.30 cm     LV E/e' medial:  10.4 LV IVS:        1.30 cm     LV e' lateral:   5.45 cm/s LVOT diam:     2.20 cm     LV E/e' lateral: 7.7 LV SV:         85 LV SV Index:   50 LVOT Area:     3.80 cm  LV Volumes (MOD) LV vol d, MOD A2C: 81.8 ml LV vol d, MOD A4C: 90.6 ml LV vol s, MOD A2C: 32.0 ml LV vol s, MOD A4C: 39.3 ml LV SV MOD A2C:     49.8 ml LV SV MOD A4C:     90.6 ml LV SV MOD BP:      52.9 ml RIGHT VENTRICLE RV S prime:     14.30 cm/s TAPSE (M-mode): 2.1 cm LEFT ATRIUM             Index        RIGHT ATRIUM           Index LA diam:        3.70 cm 2.16 cm/m   RA Area:     15.60 cm LA Vol (A2C):   55.3 ml 32.32 ml/m  RA Volume:   38.90 ml  22.73 ml/m LA Vol (A4C):   47.9 ml 27.99 ml/m LA Biplane Vol: 53.4 ml 31.21 ml/m  AORTIC VALVE LVOT Vmax:   114.00 cm/s LVOT Vmean:  73.300 cm/s LVOT VTI:    0.223 m  AORTA Ao Root diam: 3.90 cm MITRAL VALVE               TRICUSPID VALVE MV Area (PHT): 2.00 cm    TR Peak grad:   8.5 mmHg MV Decel Time: 380 msec    TR Vmax:        146.00 cm/s MV E velocity: 42.20 cm/s MV A velocity: 79.50 cm/s  SHUNTS MV E/A ratio:  0.53        Systemic VTI:  0.22 m                            Systemic Diam: 2.20 cm Skeet Latch MD Electronically signed by Skeet Latch MD Signature Date/Time: 01/29/2022/10:57:38 AM    Final    DG Chest 2 View  Addendum Date: 01/28/2022   ADDENDUM REPORT: 01/28/2022 15:05 ADDENDUM: Tip of right chest port is seen in the region of right innominate vein. Electronically Signed   By: Elmer Picker M.D.   On: 01/28/2022 15:05   Result Date: 01/28/2022 CLINICAL DATA:  Shortness of breath, cough EXAM: CHEST - 2 VIEW COMPARISON:  Previous studies including the examination of 05/14/2019 FINDINGS: Cardiac size is within normal limits. Thoracic aorta is tortuous.  Right hilum appears more prominent. There is 2.8 cm smooth marginated nodule in the left upper lung fields adjacent to the left hilum.Rest of the lung fields are essentially clear. There is no pleural effusion or pneumothorax. IMPRESSION: There is 2.8 cm nodular density in the left  parahilar region. Possibility of malignant neoplasm is not excluded. Follow-up CT chest may be considered. Right hilum appears more prominent. This may be due to prominent central pulmonary vessels or lymphadenopathy. Electronically Signed: By: Elmer Picker M.D. On: 01/27/2022 17:45   CT ABDOMEN PELVIS W CONTRAST  Result Date: 01/28/2022 CLINICAL DATA:  Evaluate for metastatic disease. EXAM: CT ABDOMEN AND PELVIS WITH CONTRAST TECHNIQUE: Multidetector CT imaging of the abdomen and pelvis was performed using the standard protocol following bolus administration of intravenous contrast. RADIATION DOSE REDUCTION: This exam was performed according to the departmental dose-optimization program which includes automated exposure control, adjustment of the mA and/or kV according to patient size and/or use of iterative reconstruction technique. CONTRAST:  160m OMNIPAQUE IOHEXOL 300 MG/ML  SOLN COMPARISON:  CT abdomen pelvis dated 09/22/2020. chest CT dated 01/27/2022. FINDINGS: Lower chest: Multiple pulmonary nodules in the visualized right lung base consistent with metastatic disease. Partially visualized right perihilar nodular densities (1/3) corresponding to the right hilar mass or lymphadenopathy. These findings are better evaluated on the chest CT of 01/27/2022. No intra-abdominal free air.  Small free fluid in the pelvis. Hepatobiliary: Faint small nodular deposit along the liver capsule in the right lobe of the liver (12/3) measuring up to 12 mm most consistent with metastatic implant. No intrahepatic biliary ductal dilatation. The gallbladder is unremarkable. Pancreas: The pancreas is unremarkable. No active inflammatory  changes. No dilatation of the main pancreatic duct. Spleen: The spleen is unremarkable. Adrenals/Urinary Tract: The adrenal glands are unremarkable. There is moderate right hydronephrosis and mild right hydroureter secondary to compression and obstruction of the distal right ureter by a metastatic lesion in the right hemipelvis. The left kidney is unremarkable. The urinary bladder is minimally distended and grossly unremarkable. Stomach/Bowel: There is no bowel obstruction or active inflammation. The appendix is normal. Vascular/Lymphatic: Moderate aortoiliac atherosclerotic disease. The IVC is unremarkable. No portal venous gas. No retroperitoneal adenopathy. Reproductive: Hysterectomy. Other: Several pelvic masses consistent with metastatic disease. The masses demonstrate central low attenuation, likely necrotic tissue. The largest measuring 4.4 x 3.5 x 4.7 cm in the right hemipelvis. This mass causes compression of the distal right ureter with associated right-sided hydronephrosis. Additional metastatic implants in the omentum and in the left upper abdomen measure up to 4.3 x 3.3 cm in axial dimensions (42/3). There is a 4.5 x 2.3 cm metastatic implant in the right lateral abdominal wall musculature (45/3). Faint 1.4 x 1.0 cm nodular density over the left diaphragmatic crus (22/3) suspicious for metastatic implant. Musculoskeletal: There is osteopenia with degenerative changes of the spine. No acute osseous pathology. No suspicious bone lesions. IMPRESSION: 1. Multiple abdominal and pelvic metastatic implants as above. 2. Moderate right hydronephrosis secondary to mass effect and obstruction of the distal right ureter by a right pelvic metastatic implant. 3. Multiple pulmonary metastatic disease in the visualized right lung base. 4. No bowel obstruction. Normal appendix. 5. Aortic Atherosclerosis (ICD10-I70.0). Electronically Signed   By: AAnner CreteM.D.   On: 01/28/2022 03:41   CT Angio Chest PE W and/or  Wo Contrast  Result Date: 01/27/2022 CLINICAL DATA:  Pulmonary embolism (PE) suspected, high prob History of cancer, pleuritic left-sided chest pain, short of breath. Abnormality on chest x-ray CT recommended. Rule out PE versus cancer versus pneumonia versus soft tissue injury EXAM: CT ANGIOGRAPHY CHEST WITH CONTRAST TECHNIQUE: Multidetector CT imaging of the chest was performed using the standard protocol during bolus administration of intravenous contrast. Multiplanar CT image reconstructions and MIPs were obtained to  evaluate the vascular anatomy. RADIATION DOSE REDUCTION: This exam was performed according to the departmental dose-optimization program which includes automated exposure control, adjustment of the mA and/or kV according to patient size and/or use of iterative reconstruction technique. CONTRAST:  46m OMNIPAQUE IOHEXOL 350 MG/ML SOLN COMPARISON:  Chest radiograph earlier today. No prior chest CT FINDINGS: Cardiovascular: There are no filling defects within the pulmonary arteries to suggest pulmonary embolus. Mild aortic atherosclerosis. The heart is normal in size. There are coronary artery calcifications. No pericardial effusion. Mediastinum/Nodes: There is multifocal mediastinal adenopathy. Anterior paratracheal node versus nodal conglomerate measuring 2 cm short axis series 5, image 45. Subcarinal node versus nodal conglomerate measuring 2.8 cm series 5, image 56. There are multiple enlarged right hilar nodes measuring from 14 to 19 mm. There are enlarged left hilar nodes measuring up to 15 mm. There is a 14 mm right epicardial node. The esophagus is decompressed. Lungs/Pleura: Moderate emphysema and bronchial thickening. The right lobar bronchi are attenuated due to adjacent adenopathy. There are multiple pulmonary nodules throughout both lungs of varying sizes. Right perihilar spiculated nodule spanning the fissure measures 4 x 2.3 x 3.6 cm and is cavitating series 6, image 47, series 8,  image 77. 2.5 cm left upper lobe nodule corresponds to abnormality on CT. Majority of the additional nodules range from 5-15 mm. Fissural thickening in the left lung. No features of pulmonary edema. There is no endobronchial lesion. No pleural effusion. Upper Abdomen: Limited assessment for focal hepatic lesion given phase of contrast. There is a small soft tissue density medial to the right hepatic lobe posterior to the right kidney series 5, image 127. Right hydronephrosis is partially included in the field of view. Lack of upper abdominal bowel fat limits detailed assessment. Musculoskeletal: Nondisplaced left anterior seventh and eighth rib fractures. There is some soft tissue thickening adjacent to the seventh rib fracture suggesting this may be pathologic. No chest wall soft tissue abnormalities. Review of the MIP images confirms the above findings. IMPRESSION: 1. No pulmonary embolus. 2. Findings consistent with intrathoracic malignancy. Multiple pulmonary nodules throughout both lungs of varying sizes, consistent with metastatic disease. Primary site of malignancy may represent a 4 cm spiculated nodule in the right perihilar region spanning the fissure that is cavitating. Alternatively all of these nodules may be metastatic given history of cancer, type not specified. 3. Multifocal mediastinal and bilateral hilar adenopathy, consistent metastatic disease. 4. Nondisplaced left anterior seventh and eighth rib fractures, soft tissue thickening adjacent to the seventh rib fracture suggests this may be pathologic 5. Right hydronephrosis is partially included in the field of view in the upper abdomen. Small soft tissue density medial to the right hepatic lobe posterior to the right kidney is nonspecific but may represent metastatic disease. Recommend staging CT of the abdomen and pelvis with oral and IV contrast. 6. Moderate emphysema. 7. Aortic atherosclerosis.  Coronary artery calcifications. Aortic  Atherosclerosis (ICD10-I70.0) and Emphysema (ICD10-J43.9). Electronically Signed   By: MKeith RakeM.D.   On: 01/27/2022 23:03    Labs:  CBC: Recent Labs    01/27/22 1709 01/28/22 0608 01/29/22 0046 01/30/22 0429  WBC 7.3 6.9 4.8 5.5  HGB 12.7 12.4 11.6* 11.8*  HCT 39.7 36.7 35.8* 36.7  PLT 323 274 260 256    COAGS: No results for input(s): "INR", "APTT" in the last 8760 hours.  BMP: Recent Labs    01/27/22 1709 01/28/22 0608 01/29/22 0046 01/30/22 0429  NA 136 135 138 139  K 3.8  3.8 4.3 4.2  CL 103 101 104 104  CO2 '24 23 24 26  '$ GLUCOSE 202* 137* 108* 124*  BUN '14 11 18 17  '$ CALCIUM 8.9 8.6* 8.9 8.7*  CREATININE 1.17* 1.15* 1.24* 1.27*  GFRNONAA 54* 55* 50* 49*    LIVER FUNCTION TESTS: Recent Labs    01/27/22 1709 01/28/22 0608 01/29/22 0046 01/30/22 0429  BILITOT 0.5 0.4 0.4 0.6  AST 15 14* 16 19  ALT '12 11 14 11  '$ ALKPHOS 56 53 52 45  PROT 7.3 7.0 6.6 6.7  ALBUMIN 3.0* 2.9* 2.7* 2.8*     Assessment and Plan:  61 y.o. female inpatient. Former smoker. History of CAD,MI DM, HTN, uterine cancer. Presented to the ED at [facility] with left sided chest pain that occurred after a fall approximately 1 week ago and 1 episode of hemoptysis. Found to have rib fractures and metastatic disease. Patient was transported to [facility] for chemotherapy Patient has a right sided single portacath placed at a OSH. Chest xray from 6.17.23 shows the tip in the region of the right innominate. Team is requesting a portacath removal and portacath placement.   GFR < 49, Cr 1.27. Patient is on plavix 75 mg and subcutaneous prophylactic dose of lovenox.  All other labs and medications are within acceptable parameters. No pertinent allergies.   IR consulted for possible portacath removal and portacath placement.  Patient tentatively scheduled for 6.20.23.  Team instructed to: Keep Patient to be NPO after midnight IR will call patient when ready.  Risks and benefits of image guided  port-a-catheter placement was discussed with the patient including, but not limited to bleeding, infection, pneumothorax, or fibrin sheath development and need for additional procedures.  All of the patient's questions were answered, patient is agreeable to proceed. Consent signed and in chart.   Thank you for this interesting consult.  I greatly enjoyed meeting MARBETH SMEDLEY and look forward to participating in their care.  A copy of this report was sent to the requesting provider on this date.  Electronically Signed: Jacqualine Mau, NP 01/30/2022, 1:28 PM   I spent a total of 40 Minutes    in face to face in clinical consultation, greater than 50% of which was counseling/coordinating care for portacath removal and portacath placement

## 2022-01-31 ENCOUNTER — Inpatient Hospital Stay (HOSPITAL_COMMUNITY): Payer: Medicare Other

## 2022-01-31 ENCOUNTER — Other Ambulatory Visit (HOSPITAL_COMMUNITY): Payer: Self-pay

## 2022-01-31 ENCOUNTER — Encounter: Payer: Self-pay | Admitting: Hematology and Oncology

## 2022-01-31 DIAGNOSIS — R52 Pain, unspecified: Secondary | ICD-10-CM | POA: Diagnosis not present

## 2022-01-31 HISTORY — PX: IR REMOVAL TUN ACCESS W/ PORT W/O FL MOD SED: IMG2290

## 2022-01-31 HISTORY — PX: IR IMAGING GUIDED PORT INSERTION: IMG5740

## 2022-01-31 LAB — GLUCOSE, CAPILLARY
Glucose-Capillary: 136 mg/dL — ABNORMAL HIGH (ref 70–99)
Glucose-Capillary: 148 mg/dL — ABNORMAL HIGH (ref 70–99)
Glucose-Capillary: 147 mg/dL — ABNORMAL HIGH (ref 70–99)
Glucose-Capillary: 408 mg/dL — ABNORMAL HIGH (ref 70–99)

## 2022-01-31 LAB — CA 125: Cancer Antigen (CA) 125: 145 U/mL — ABNORMAL HIGH (ref 0.0–38.1)

## 2022-01-31 LAB — GLUCOSE, RANDOM: Glucose, Bld: 458 mg/dL — ABNORMAL HIGH (ref 70–99)

## 2022-01-31 MED ORDER — METOPROLOL TARTRATE 25 MG PO TABS
25.0000 mg | ORAL_TABLET | Freq: Two times a day (BID) | ORAL | 0 refills | Status: DC
  Filled 2022-01-31: qty 60, 30d supply, fill #0

## 2022-01-31 MED ORDER — LIDOCAINE-EPINEPHRINE 1 %-1:100000 IJ SOLN
INTRAMUSCULAR | Status: AC
  Filled 2022-01-31: qty 1

## 2022-01-31 MED ORDER — INSULIN ASPART 100 UNIT/ML IJ SOLN
10.0000 [IU] | Freq: Once | INTRAMUSCULAR | Status: AC
  Administered 2022-01-31: 10 [IU] via SUBCUTANEOUS

## 2022-01-31 MED ORDER — MIDAZOLAM HCL 2 MG/2ML IJ SOLN
INTRAMUSCULAR | Status: DC | PRN
  Administered 2022-01-31: 1 mg via INTRAVENOUS
  Administered 2022-01-31: 2 mg via INTRAVENOUS

## 2022-01-31 MED ORDER — HEPARIN SOD (PORK) LOCK FLUSH 100 UNIT/ML IV SOLN
500.0000 [IU] | Freq: Once | INTRAVENOUS | Status: AC
  Administered 2022-01-31: 500 [IU] via INTRAVENOUS

## 2022-01-31 MED ORDER — SODIUM CHLORIDE 0.9 % IV SOLN
131.2500 mg/m2 | Freq: Once | INTRAVENOUS | Status: AC
  Administered 2022-01-31: 228 mg via INTRAVENOUS
  Filled 2022-01-31: qty 38

## 2022-01-31 MED ORDER — CLOPIDOGREL BISULFATE 75 MG PO TABS
75.0000 mg | ORAL_TABLET | Freq: Every day | ORAL | 0 refills | Status: AC
  Filled 2022-01-31: qty 30, 30d supply, fill #0

## 2022-01-31 MED ORDER — MAGNESIUM SULFATE 2 GM/50ML IV SOLN
2.0000 g | Freq: Once | INTRAVENOUS | Status: AC
  Administered 2022-01-31: 2 g via INTRAVENOUS
  Filled 2022-01-31: qty 50

## 2022-01-31 MED ORDER — SODIUM CHLORIDE 0.9 % IV SOLN
10.0000 mg | Freq: Once | INTRAVENOUS | Status: AC
  Administered 2022-01-31: 10 mg via INTRAVENOUS
  Filled 2022-01-31: qty 1

## 2022-01-31 MED ORDER — CHLORHEXIDINE GLUCONATE CLOTH 2 % EX PADS
6.0000 | MEDICATED_PAD | Freq: Every day | CUTANEOUS | Status: DC
  Administered 2022-01-31 – 2022-02-01 (×2): 6 via TOPICAL

## 2022-01-31 MED ORDER — PALONOSETRON HCL INJECTION 0.25 MG/5ML
0.2500 mg | Freq: Once | INTRAVENOUS | Status: AC
  Administered 2022-01-31: 0.25 mg via INTRAVENOUS
  Filled 2022-01-31: qty 5

## 2022-01-31 MED ORDER — MIDAZOLAM HCL 2 MG/2ML IJ SOLN
INTRAMUSCULAR | Status: AC
  Filled 2022-01-31: qty 2

## 2022-01-31 MED ORDER — SODIUM CHLORIDE 0.9 % IV SOLN: Freq: Once | INTRAVENOUS | Status: AC

## 2022-01-31 MED ORDER — SODIUM CHLORIDE 0.9 % IV SOLN
150.0000 mg | Freq: Once | INTRAVENOUS | Status: AC
  Administered 2022-01-31: 150 mg via INTRAVENOUS
  Filled 2022-01-31: qty 5

## 2022-01-31 MED ORDER — COLD PACK MISC ONCOLOGY: 1.0000 | Freq: Once | Status: AC | PRN

## 2022-01-31 MED ORDER — TRASTUZUMAB-DKST CHEMO 150 MG IV SOLR
8.0000 mg/kg | Freq: Once | INTRAVENOUS | Status: AC
  Administered 2022-01-31: 546 mg via INTRAVENOUS
  Filled 2022-01-31: qty 26

## 2022-01-31 MED ORDER — FENTANYL CITRATE (PF) 100 MCG/2ML IJ SOLN
INTRAMUSCULAR | Status: DC | PRN
  Administered 2022-01-31: 25 ug via INTRAVENOUS
  Administered 2022-01-31: 100 ug via INTRAVENOUS

## 2022-01-31 MED ORDER — SODIUM CHLORIDE 0.9 % IV SOLN
384.0000 mg | Freq: Once | INTRAVENOUS | Status: AC
  Administered 2022-01-31: 380 mg via INTRAVENOUS
  Filled 2022-01-31: qty 38

## 2022-01-31 MED ORDER — FENTANYL CITRATE (PF) 100 MCG/2ML IJ SOLN
INTRAMUSCULAR | Status: AC
  Filled 2022-01-31: qty 2

## 2022-01-31 MED ORDER — LIDOCAINE 5 % EX PTCH
2.0000 | MEDICATED_PATCH | CUTANEOUS | 0 refills | Status: DC
  Filled 2022-01-31: qty 30, 15d supply, fill #0

## 2022-01-31 MED ORDER — SODIUM CHLORIDE 0.9% FLUSH: 10.0000 mL | INTRAVENOUS | Status: DC | PRN

## 2022-01-31 MED ORDER — FAMOTIDINE IN NACL 20-0.9 MG/50ML-% IV SOLN
20.0000 mg | Freq: Once | INTRAVENOUS | Status: AC
  Administered 2022-01-31: 20 mg via INTRAVENOUS
  Filled 2022-01-31: qty 50

## 2022-01-31 MED ORDER — DIPHENHYDRAMINE HCL 50 MG/ML IJ SOLN
25.0000 mg | Freq: Once | INTRAMUSCULAR | Status: AC
  Administered 2022-01-31: 25 mg via INTRAVENOUS
  Filled 2022-01-31: qty 1

## 2022-01-31 MED ORDER — SODIUM CHLORIDE 0.9% FLUSH
10.0000 mL | Freq: Two times a day (BID) | INTRAVENOUS | Status: DC
  Administered 2022-01-31 – 2022-02-01 (×2): 10 mL

## 2022-01-31 MED ORDER — AMLODIPINE BESYLATE 5 MG PO TABS
5.0000 mg | ORAL_TABLET | Freq: Every day | ORAL | 0 refills | Status: DC
  Filled 2022-01-31: qty 30, 30d supply, fill #0

## 2022-01-31 MED ORDER — ACETAMINOPHEN 325 MG PO TABS
650.0000 mg | ORAL_TABLET | Freq: Once | ORAL | Status: AC
  Administered 2022-01-31: 650 mg via ORAL
  Filled 2022-01-31: qty 2

## 2022-01-31 MED ORDER — OXYCODONE HCL 5 MG PO TABS
5.0000 mg | ORAL_TABLET | Freq: Four times a day (QID) | ORAL | 0 refills | Status: AC | PRN
  Filled 2022-01-31: qty 20, 5d supply, fill #0

## 2022-01-31 MED ORDER — HEPARIN SOD (PORK) LOCK FLUSH 100 UNIT/ML IV SOLN
INTRAVENOUS | Status: AC
  Filled 2022-01-31: qty 5

## 2022-01-31 MED ORDER — ASPIRIN 81 MG PO TBEC
81.0000 mg | DELAYED_RELEASE_TABLET | Freq: Every day | ORAL | 0 refills | Status: AC
Start: 2022-01-31 — End: 2022-03-02
  Filled 2022-01-31: qty 30, 30d supply, fill #0

## 2022-01-31 NOTE — Progress Notes (Signed)
  Transition of Care (TOC) Screening Note   Patient Details  Name: Krista [name] Date of Birth: Jan 16, 1961   Transition of Care [facility]) CM/SW Contact:    Danika Kluender, Marjie Skiff, RN Phone Number: 01/31/2022, 2:34 PM    Transition of Care Department [facility]) has reviewed patient and no TOC needs have been identified at this time. We will continue to monitor patient advancement through interdisciplinary progression rounds. If new patient transition needs arise, please place a TOC consult.

## 2022-01-31 NOTE — Progress Notes (Signed)
PROGRESS NOTE MICHELENE KENISTON  DXA:[id number] DOB: 01-23-1961 DOA: 01/27/2022 PCP: Drue Flirt, MD   Brief Narrative/Hospital Course: 61 y.o.f w/ history significant for endometrial carcinoma, coronary artery disease, prior MI, type 2 diabetes, hyperlipidemia, hypertension, chronic anxiety/depression, former tobacco use disorder quit 2 weeks ago, presented to [facility] ED from home due to left-sided chest pain after falling on her left side 1 week ago ,w/ associated 1 episode of hemoptysis 3 weeks ago and a productive cough of yellow sputum.  Imaging shows multiple pulm nodules, multifocal mediastinal and bilateral hilar adenopathy c/w metastatic disease and multiple abdominal and pelvic implants. She is admitted for pain control related to rib fractures and her metastatic disease, followed by hematology oncology, cardiology.  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  Port placement planned for 6/20 then chemo therapy with carboplatin-which was given 6/20, informed by oncology that patient can discharge home after chemotherapy, patient will be discharged with mother with pain control along with antihypertensive which are new.  Rx sent   Subjective: Seen and examined getting chemotherapy today but will not be finished till 8 pm or so tonight Mother at the bedside. Assessment and Plan: Principal Problem:   Generalized pain Active Problems:   Cancer related pain   Rib fractures   Metastatic disease ([place])   Endometrial cancer ([name])   Hydronephrosis of right kidney   Hemoptysis   Elevated troponin   Cough   Elevated serum creatinine   Protein-calorie malnutrition, moderate ([name])   TOBACCO ABUSE   Type 2 diabetes mellitus ([name])   Goals of care, counseling/discussion   Essential hypertension   Recurrent metastatic uterine cancer high-grade serous, H ER 2 positive disease: Cancer related pain Pathological rib  fracture due to fall in the setting of metastasis  Hemoptysis due to metastatic disease 3 weeks ago: Dr. Alvy Bimler is following closely, based on CT scan has metastatic stage IV uterine cancer and now considered incurable.continue aggressive pain control-oral/IV opiates, lidocaine patch, NSAIDs/Tylenol PRN, antiemetics, started on palliative chemotherapy w/ carboplatin after port placement 6/20-due for cycle today.  Plan was to discharge after chemotherapy but chemo will not be finished until tonight discussed with oncology will monitor overnight if stable will discharge in the morning  Hydronephrosis of right kidney:Related to metastatic disease. Dr Bjorn Pippin discussed with urology on call, given no R sided flank pain, creatinine not significantly elevated advised outpatient follow-up with [facility]  Elevated troponin Severe coronary artery disease history with multiple cardiovascular structures: LHC in 12/2018 with severe CAD s/p DES PCI to LAD lesion.  Currently no chest pain had elevated troponin without acute EKG finding echocardiogram obtained-no acute finding risk of invasive work-up at this time outweighs the benefits given plan for chemotherapy.  Plan is to continue aspirin and Plavix,Repatha, aspirin, Plavix, metoprolol.  Lipid panel with LDL 82, encourage smoking cessation, continue blood pressure control Ascending aortic aneurysm 3.9 cm seen on echo previously 3.8 in 2020, stable. Cough: Due to pulmonary metastasis continue antitussives.   At risk of malnutrition consulted dietitian   DVT prophylaxis: enoxaparin (LOVENOX) injection 40 mg Start: 01/28/22 1000 Code Status:   Code Status: DNR Family Communication: plan of care discussed with patien at bedside. Patient status is: Inpatient because of plan for inpatient chemotherapy Level of care: Telemetry   Dispo: The patient is from: HOME            Anticipated disposition: HOME in the morning if  remains stable after chemo   Mobility  Assessment (last 72 hours)     Mobility Assessment     Row Name 01/30/22 2108 01/29/22 1835 01/29/22 0715 01/28/22 1921     Does patient have an order for bedrest or is patient medically unstable No - Continue assessment No - Continue assessment No - Continue assessment No - Continue assessment    What is the highest level of mobility based on the progressive mobility assessment? Level 6 (Walks independently in room and hall) - Balance while walking in room without assist - Complete -- Level 6 (Walks independently in room and hall) - Balance while walking in room without assist - Complete Level 6 (Walks independently in room and hall) - Balance while walking in room without assist - Complete              Objective: Vitals last 24 hrs: Vitals:   01/31/22 0929 01/31/22 0930 01/31/22 0931 01/31/22 1222  BP:  (!) 157/97  (!) 160/95  Pulse: 81 74 77 62  Resp:    18  Temp:    97.7 F (36.5 C)  TempSrc:    Oral  SpO2: (!) 88% (!) 89% (!) 88% 95%  Weight:      Height:       Weight change:   Physical Examination: General exam: AAOX3, older than stated age, weak appearing. HEENT:Oral mucosa moist, Ear/Nose WNL grossly, dentition normal. Respiratory system: bilaterally clear,no use of accessory muscle Cardiovascular system: S1 & S2 +, No JVD,. Gastrointestinal system: Abdomen soft,NT,ND,BS+ Nervous System:Alert, awake, moving extremities and grossly nonfocal Extremities: LE ankle edema NEG, distal peripheral pulses palpable.  Skin: No rashes,no icterus. MSK: Normal muscle bulk,tone, power   Medications reviewed:  Scheduled Meds:  amLODipine  5 mg Oral Daily   aspirin  81 mg Oral Daily   CARBOplatin  380 mg Intravenous Once   Chlorhexidine Gluconate Cloth  6 each Topical Daily   clopidogrel  75 mg Oral Daily   enoxaparin (LOVENOX) injection  40 mg Subcutaneous Q24H   guaiFENesin  600 mg Oral BID   insulin aspart  0-5 Units Subcutaneous QHS   insulin aspart  0-9 Units  Subcutaneous TID WC   lidocaine  2 patch Transdermal Q24H   lidocaine-EPINEPHrine       metoprolol tartrate  25 mg Oral BID   PACLitaxel  131.25 mg/m2 (Treatment Plan Recorded) Intravenous Once   senna-docusate  1 tablet Oral QHS   sodium chloride flush  10-40 mL Intracatheter Q12H   trastuzumab-dkst (OGIVRI) 546 mg in sodium chloride 0.9 % 250 mL chemo infusion  8 mg/kg (Treatment Plan Recorded) Intravenous Once   Continuous Infusions:  lactated ringers 100 mL/hr at 01/31/22 0137      Diet Order             Diet heart healthy/carb modified Room service appropriate? Yes; Fluid consistency: Thin  Diet effective now           Diet - low sodium heart healthy                            Intake/Output Summary (Last 24 hours) at 01/31/2022 1520 Last data filed at 01/31/2022 0500 Gross per 24 hour  Intake 1430.12 ml  Output 2000 ml  Net -569.88 ml   Net IO Since Admission: 75.32 mL [01/31/22 1520]  Wt Readings from Last 3 Encounters:  01/27/22 68 kg  10/12/20 69.9 kg  04/09/20  81.4 kg     Unresulted Labs (From admission, onward)     Start     Ordered   02/04/22 0500  Creatinine, serum  (enoxaparin (LOVENOX)    CrCl >/= 30 ml/min)  Weekly,   R     Comments: while on enoxaparin therapy    01/28/22 0543   02/01/22 4098  Basic metabolic panel  Daily,   R     Question:  Specimen collection method  Answer:  Lab=Lab collect   01/31/22 0823   02/01/22 0500  CBC  Daily,   R     Question:  Specimen collection method  Answer:  Lab=Lab collect   01/31/22 0823   Unscheduled  CBC with Differential ([place] Only)  STAT      01/29/22 1416   Unscheduled  CMP ([place] only)  STAT      01/29/22 1416          Data Reviewed: I have personally reviewed following labs and imaging studies CBC: Recent Labs  Lab 01/27/22 1709 01/28/22 0608 01/29/22 0046 01/30/22 0429  WBC 7.3 6.9 4.8 5.5  NEUTROABS 4.0 3.8 2.3 3.0  HGB 12.7 12.4 11.6* 11.8*  HCT 39.7 36.7 35.8*  36.7  MCV 85.4 82.8 84.0 84.0  PLT 323 274 260 119   Basic Metabolic Panel: Recent Labs  Lab 01/27/22 1709 01/28/22 0608 01/29/22 0046 01/30/22 0429  NA 136 135 138 139  K 3.8 3.8 4.3 4.2  CL 103 101 104 104  CO2 '24 23 24 26  '$ GLUCOSE 202* 137* 108* 124*  BUN '14 11 18 17  '$ CREATININE 1.17* 1.15* 1.24* 1.27*  CALCIUM 8.9 8.6* 8.9 8.7*  MG  --  1.8 1.9 1.8  PHOS  --  3.8 4.0 3.3   GFR: Estimated Creatinine Clearance: 44.7 mL/min (A) (by C-G formula based on SCr of 1.27 mg/dL (H)). Liver Function Tests: Recent Labs  Lab 01/27/22 1709 01/28/22 0608 01/29/22 0046 01/30/22 0429  AST 15 14* 16 19  ALT '12 11 14 11  '$ ALKPHOS 56 53 52 45  BILITOT 0.5 0.4 0.4 0.6  PROT 7.3 7.0 6.6 6.7  ALBUMIN 3.0* 2.9* 2.7* 2.8*   Recent Labs  Lab 01/27/22 2133  LIPASE 56*   No results for input(s): "AMMONIA" in the last 168 hours. Coagulation Profile: No results for input(s): "INR", "PROTIME" in the last 168 hours. BNP (last 3 results) No results for input(s): "PROBNP" in the last 8760 hours. HbA1C: Recent Labs    01/30/22 0429  HGBA1C 8.1*   CBG: Recent Labs  Lab 01/30/22 1228 01/30/22 1646 01/30/22 2112 01/31/22 0725 01/31/22 1120  GLUCAP 122* 158* 189* 136* 148*   Lipid Profile: Recent Labs    01/30/22 0429  CHOL 134  HDL 37*  LDLCALC 82  TRIG 77  CHOLHDL 3.6   Thyroid Function Tests: No results for input(s): "TSH", "T4TOTAL", "FREET4", "T3FREE", "THYROIDAB" in the last 72 hours. Sepsis Labs: No results for input(s): "PROCALCITON", "LATICACIDVEN" in the last 168 hours.  Recent Results (from the past 240 hour(s))  MRSA Next Gen by PCR, Nasal     Status: None   Collection Time: 01/28/22 10:05 AM   Specimen: Nasal Mucosa; Nasal Swab  Result Value Ref Range Status   MRSA by PCR Next Gen NOT DETECTED NOT DETECTED Final    Comment: (NOTE) The GeneXpert MRSA Assay (FDA approved for NASAL specimens only), is one component of a comprehensive MRSA colonization  surveillance program. It is not intended  to diagnose MRSA infection nor to guide or monitor treatment for MRSA infections. Test performance is not FDA approved in patients less than 48 years old. Performed at [facility], [place] [address], [place], [place] [address]     Antimicrobials: Anti-infectives (From admission, onward)    None      Culture/Microbiology    Component Value Date/Time   SDES URINE, CLEAN CATCH 05/13/2019 1348   SPECREQUEST  05/13/2019 1348    NONE Performed at [facility], [place] [address], [place], [place] [address]    CULT MULTIPLE SPECIES PRESENT, SUGGEST RECOLLECTION (A) 05/13/2019 1348   REPTSTATUS 05/14/2019 FINAL 05/13/2019 1348    Other culture-see note Radiology Studies: IR IMAGING GUIDED PORT INSERTION  Result Date: 01/31/2022 CLINICAL DATA:  Short malfunctioning right subclavian power port catheter EXAM: ULTRASOUND GUIDANCE FOR VASCULAR ACCESS RIGHT IJ POWER PORT CATHETER INSERTION (UTILIZING THE EXISTING RIGHT CHEST PORT POCKET) Date:  01/31/2022 01/31/2022 9:48 am Radiologist:  Jerilynn Mages. Daryll Brod, MD Guidance:  ULTRASOUND AND FLUOROSCOPIC MEDICATIONS: 1% lidocaine local with epinephrine. ANESTHESIA/SEDATION: Versed 3.0 mg IV; Fentanyl 125 mcg IV; Moderate Sedation Time:  42 minute The patient was continuously monitored during the procedure by the interventional radiology nurse under my direct supervision. FLUOROSCOPY: Fluoroscopy Time:(2.0 mGy). CONTRAST:  None. COMPLICATIONS: None immediate. PROCEDURE: Informed consent was obtained from the patient following explanation of the procedure, risks, benefits and alternatives. The patient understands, agrees and consents for the procedure. All questions were addressed. A time out was performed. Under sterile conditions and local anesthesia, right internal jugular micropuncture venous access was performed with ultrasound. Images were obtained for documentation. A guide wire was inserted followed by a  4-French dilator. A catheter and guidewire were utilized to access the IVC. The existing right subclavian power port catheter was removed. This was done under sterile conditions and local anesthesia. An incision was made along the existing port catheter scar. Port catheter was removed with sharp and blunt dissection from the subcutaneous pocket. Pocket was flushed with saline and suspected. No signs of infection. Adequate hemostasis. Subcutaneous pocket can be utilized for revision. Measurements were obtained from the SVC/RA junction back to the venotomy site. The port catheter was assembled and checked for leakage. The port catheter was secured in the existing pocket with two retention Ethilon sutures. The tubing was tunneled subcutaneously to the new right IJ venotomy site and inserted into the SVC/RA junction through a valved peel-away sheath. Position was confirmed with fluoroscopy. No immediate complications. The patient tolerated the procedure well. Incisions were closed with Vicryl suture and Dermabond. The port catheter was accessed, blood was aspirated followed by saline and heparin flushes. Needle was removed. A sterile dressing was applied. IMPRESSION: Successful ultrasound and fluoroscopic right IJ power port catheter insertion utilizing the existing right subclavian port catheter pocket. Malpositioned right subclavian port catheter was removed. Electronically Signed   By: Jerilynn Mages.  Shick M.D.   On: 01/31/2022 13:45   IR REMOVAL TUN ACCESS W/ PORT W/O FL MOD SED  Result Date: 01/31/2022 CLINICAL DATA:  Short malfunctioning right subclavian power port catheter EXAM: ULTRASOUND GUIDANCE FOR VASCULAR ACCESS RIGHT IJ POWER PORT CATHETER INSERTION (UTILIZING THE EXISTING RIGHT CHEST PORT POCKET) Date:  01/31/2022 01/31/2022 9:48 am Radiologist:  Jerilynn Mages. Daryll Brod, MD Guidance:  ULTRASOUND AND FLUOROSCOPIC MEDICATIONS: 1% lidocaine local with epinephrine. ANESTHESIA/SEDATION: Versed 3.0 mg IV; Fentanyl 125 mcg IV;  Moderate Sedation Time:  42 minute The patient was continuously monitored during the procedure by the interventional  radiology nurse under my direct supervision. FLUOROSCOPY: Fluoroscopy Time:(2.0 mGy). CONTRAST:  None. COMPLICATIONS: None immediate. PROCEDURE: Informed consent was obtained from the patient following explanation of the procedure, risks, benefits and alternatives. The patient understands, agrees and consents for the procedure. All questions were addressed. A time out was performed. Under sterile conditions and local anesthesia, right internal jugular micropuncture venous access was performed with ultrasound. Images were obtained for documentation. A guide wire was inserted followed by a 4-French dilator. A catheter and guidewire were utilized to access the IVC. The existing right subclavian power port catheter was removed. This was done under sterile conditions and local anesthesia. An incision was made along the existing port catheter scar. Port catheter was removed with sharp and blunt dissection from the subcutaneous pocket. Pocket was flushed with saline and suspected. No signs of infection. Adequate hemostasis. Subcutaneous pocket can be utilized for revision. Measurements were obtained from the SVC/RA junction back to the venotomy site. The port catheter was assembled and checked for leakage. The port catheter was secured in the existing pocket with two retention Ethilon sutures. The tubing was tunneled subcutaneously to the new right IJ venotomy site and inserted into the SVC/RA junction through a valved peel-away sheath. Position was confirmed with fluoroscopy. No immediate complications. The patient tolerated the procedure well. Incisions were closed with Vicryl suture and Dermabond. The port catheter was accessed, blood was aspirated followed by saline and heparin flushes. Needle was removed. A sterile dressing was applied. IMPRESSION: Successful ultrasound and fluoroscopic right IJ power  port catheter insertion utilizing the existing right subclavian port catheter pocket. Malpositioned right subclavian port catheter was removed. Electronically Signed   By: Jerilynn Mages.  Shick M.D.   On: 01/31/2022 13:45     LOS: 3 days   Antonieta Pert, MD [facility]  01/31/2022, 3:20 PM

## 2022-01-31 NOTE — Care Management Important Message (Signed)
Important Message  Patient Details IM Letter given to the Patient. Name: Krista [name] MRN: [id number] Date of Birth: January 16, 1961   Medicare Important Message Given:  Yes     Kerin Salen 01/31/2022, 10:03 AM

## 2022-01-31 NOTE — Procedures (Signed)
Interventional Radiology Procedure Note  Procedure: RT PORT REVISION(CONVERSION OF THE RT SUBCLAVIAN PORT TO A RT IJ PORT USING THE SAME POCKET    Complications: None  Estimated Blood Loss:  MIN  Findings: TIP SVCRA    Tamera Punt, MD

## 2022-01-31 NOTE — Progress Notes (Signed)
Premeds administered.  Trastuzumab '546mg'$  given over 1.5 hr via Port.  Pt drowsy, but easily arouses.  Will continue to monitor.

## 2022-01-31 NOTE — Progress Notes (Addendum)
Krista [name]   DOB:1960-12-24   MV#:[id number]    I saw the patient and agree with documentation as follows ASSESSMENT & PLAN:  Recurrent, metastatic uterine cancer, high-grade serous, HER2 positive disease, MSI stable I told the patient CT finding is compatible with metastatic, stage IV uterine cancer I told her that her disease is now considered incurable The patient had nausea with last cycle of treatment She is keen to receive palliative chemotherapy locally here IR placed a new Port-A-Cath on 01/31/2022 I recommend we proceed with chemotherapy with carboplatin and paclitaxel along with Herceptin  We will plan for chemotherapy to begin today We discussed the risk, benefits, side effects of chemotherapy and she wants to proceed I plan upfront dose adjustment for paclitaxel due to pre-existing neuropathy She will follow-up with me next week   History of severe coronary artery disease, multiple cardiovascular risk factors including diabetes, hypertension and smoking, with elevated serum troponin I am concerned about elevated serum troponin Echocardiogram result showed no signs of cardiomyopathy She was evaluated by cardiologist who recommend we continue on medical management   Right-sided hydronephrosis with mild chronic renal failure She does not need stent placement right now Continue risk factor modification and hydration   History of tobacco use The patient stated she has quit smoking for 2 weeks   Poorly controlled diabetes with peripheral neuropathy She will likely have significant risk and side effects with neuropathy while on treatment We discussed the importance of dietary and lifestyle changes   Medical noncompliance The patient has been noncompliant with follow-up at another health system I told the patient that her disease is terminal and without chemotherapy inconsistent follow-up, her prognosis is very poor   Moderate protein calorie malnutrition We will get  dietitian to see   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 The patient knows to call the clinic with any problems, questions or concerns.   Mikey Bussing, NP 01/31/2022 8:14 AM Ziomara Birenbaum Alvy Bimler  Subjective:  Had Port-A-Cath placed by IR earlier today Sleepy at the time my visit and offers no complaints  Objective:  Vitals:   01/31/22 0444 01/31/22 0548  BP: (!) 163/107 (!) 151/97  Pulse: 78 75  Resp: 18 18  Temp: 99.2 F (37.3 C)   SpO2: 99% 95%     Intake/Output Summary (Last 24 hours) at 01/31/2022 0814 Last data filed at 01/31/2022 0500 Gross per 24 hour  Intake 1970.12 ml  Output 3600 ml  Net -1629.88 ml    GENERAL:alert, no distress and comfortable NEURO: alert & oriented x 3 with fluent speech, no focal motor/sensory deficits   Labs:  Recent Labs    01/28/22 0608 01/29/22 0046 01/30/22 0429  NA 135 138 139  K 3.8 4.3 4.2  CL 101 104 104  CO2 '23 24 26  ' GLUCOSE 137* 108* 124*  BUN '11 18 17  ' CREATININE 1.15* 1.24* 1.27*  CALCIUM 8.6* 8.9 8.7*  GFRNONAA 55* 50* 49*  PROT 7.0 6.6 6.7  ALBUMIN 2.9* 2.7* 2.8*  AST  14* 16 19  ALT '11 14 11  ' ALKPHOS 53 52 45  BILITOT 0.4 0.4 0.6    Studies:  ECHOCARDIOGRAM COMPLETE  Result Date: 01/29/2022    ECHOCARDIOGRAM REPORT   Patient Name:   ALFRETTA PINCH [facility] Date of Exam: 01/29/2022 Medical Rec #:  [id number]        Height:       63.0 in Accession #:    [id number]       Weight:       150.0 lb Date of Birth:  1961-02-25       BSA:           70.711 m Patient Age:    61 years         BP:           132/89 mmHg Patient Gender: F                HR:           70 bpm. Exam Location:  Inpatient Procedure: 2D Echo, Cardiac Doppler and Color Doppler Indications:    Elevated Troponin  History:        Patient has prior history of Echocardiogram examinations, most                 recent 05/13/2019. Previous Myocardial Infarction and CAD; Risk                 Factors:Hypertension and Diabetes.  Sonographer:    Bernadene Person RDCS Referring Phys: [id number] Chambers  1. Left ventricular ejection fraction, by estimation, is 55 to 60%. The left ventricle has normal function. The left ventricle has no regional wall motion abnormalities. There is moderate concentric left ventricular hypertrophy. Left ventricular diastolic parameters are consistent with Grade I diastolic dysfunction (impaired relaxation).  2. Right ventricular systolic function is normal. The right ventricular size is normal. There is normal pulmonary artery systolic pressure.  3. Left atrial size was mildly dilated.  4. The mitral valve is normal in structure. Trivial mitral valve regurgitation. No evidence of mitral stenosis.  5. The aortic valve is tricuspid. Aortic valve regurgitation is trivial. No aortic stenosis is present.  6. Aortic dilatation noted. There is mild dilatation of the aortic root, measuring 39 mm.  7. The inferior vena cava is normal in size with <50% respiratory variability, suggesting right atrial pressure of 8 mmHg. FINDINGS  Left Ventricle: Left ventricular ejection fraction, by estimation, is 55 to 60%. The left ventricle has normal function. The left ventricle has no regional wall motion abnormalities. The left ventricular internal cavity size was normal in size. There is  moderate concentric left ventricular hypertrophy. Left ventricular diastolic parameters are consistent with Grade I diastolic dysfunction (impaired relaxation). Indeterminate filling pressures.  Right Ventricle: The right ventricular size is normal. No increase in right ventricular wall thickness. Right ventricular systolic function is normal. There is normal pulmonary artery systolic pressure. The tricuspid regurgitant velocity is 1.46 m/s, and  with an assumed right atrial pressure of 8 mmHg, the estimated right ventricular systolic pressure is 84.1 mmHg. Left Atrium: Left atrial size was mildly dilated. Right Atrium: Right atrial size was normal in size. Pericardium: There is no evidence of pericardial effusion. Mitral Valve: The mitral valve is normal in structure. Trivial mitral valve regurgitation. No evidence of mitral valve stenosis. Tricuspid Valve: The tricuspid valve is normal in structure. Tricuspid valve regurgitation is trivial. No evidence of tricuspid stenosis. Aortic Valve: The aortic valve  is tricuspid. Aortic valve regurgitation is trivial. No aortic stenosis is present. Pulmonic Valve: The pulmonic valve was normal in structure. Pulmonic valve regurgitation is not visualized. No evidence of pulmonic stenosis. Aorta: Aortic dilatation noted. There is mild dilatation of the aortic root, measuring 39 mm. Venous: The inferior vena cava is normal in size with less than 50% respiratory variability, suggesting right atrial pressure of 8 mmHg. IAS/Shunts: No atrial level shunt detected by color flow Doppler.  LEFT VENTRICLE PLAX 2D LVIDd:         4.00 cm     Diastology LVIDs:         2.50 cm     LV e' medial:    4.05 cm/s LV PW:         1.30 cm     LV E/e' medial:  10.4 LV IVS:        1.30 cm     LV e' lateral:   5.45 cm/s LVOT diam:     2.20 cm     LV E/e' lateral: 7.7 LV SV:         85 LV SV Index:   50 LVOT Area:     3.80 cm  LV Volumes (MOD) LV vol d, MOD A2C: 81.8 ml LV vol d, MOD A4C: 90.6 ml LV vol s, MOD A2C: 32.0 ml LV vol s, MOD A4C: 39.3 ml LV SV MOD A2C:     49.8 ml LV SV MOD A4C:     90.6 ml LV SV MOD BP:      52.9 ml RIGHT VENTRICLE RV S prime:     14.30 cm/s TAPSE (M-mode): 2.1  cm LEFT ATRIUM             Index        RIGHT ATRIUM           Index LA diam:        3.70 cm 2.16 cm/m   RA Area:     15.60 cm LA Vol (A2C):   55.3 ml 32.32 ml/m  RA Volume:   38.90 ml  22.73 ml/m LA Vol (A4C):   47.9 ml 27.99 ml/m LA Biplane Vol: 53.4 ml 31.21 ml/m  AORTIC VALVE LVOT Vmax:   114.00 cm/s LVOT Vmean:  73.300 cm/s LVOT VTI:    0.223 m  AORTA Ao Root diam: 3.90 cm MITRAL VALVE               TRICUSPID VALVE MV Area (PHT): 2.00 cm    TR Peak grad:   8.5 mmHg MV Decel Time: 380 msec    TR Vmax:        146.00 cm/s MV E velocity: 42.20 cm/s MV A velocity: 79.50 cm/s  SHUNTS MV E/A ratio:  0.53        Systemic VTI:  0.22 m                            Systemic Diam: 2.20 cm Skeet Latch MD Electronically signed by Skeet Latch MD Signature Date/Time: 01/29/2022/10:57:38 AM    Final    DG Chest 2 View  Addendum Date: 01/28/2022   ADDENDUM REPORT: 01/28/2022 15:05 ADDENDUM: Tip of right chest port is seen in the region of right innominate vein. Electronically Signed   By: Elmer Picker M.D.   On: 01/28/2022 15:05   Result Date: 01/28/2022 CLINICAL DATA:  Shortness of breath, cough EXAM: CHEST - 2 VIEW COMPARISON:  Previous studies including the examination of 05/14/2019 FINDINGS: Cardiac size is within normal limits. Thoracic aorta is tortuous. Right hilum appears more prominent. There is 2.8 cm smooth marginated nodule in the left upper lung fields adjacent to the left hilum.Rest of the lung fields are essentially clear. There is no pleural effusion or pneumothorax. IMPRESSION: There is 2.8 cm nodular density in the left parahilar region. Possibility of malignant neoplasm is not excluded. Follow-up CT chest may be considered. Right hilum appears more prominent. This may be due to prominent central pulmonary vessels or lymphadenopathy. Electronically Signed: By: Elmer Picker M.D. On: 01/27/2022 17:45   CT ABDOMEN PELVIS W CONTRAST  Result Date: 01/28/2022 CLINICAL DATA:   Evaluate for metastatic disease. EXAM: CT ABDOMEN AND PELVIS WITH CONTRAST TECHNIQUE: Multidetector CT imaging of the abdomen and pelvis was performed using the standard protocol following bolus administration of intravenous contrast. RADIATION DOSE REDUCTION: This exam was performed according to the departmental dose-optimization program which includes automated exposure control, adjustment of the mA and/or kV according to patient size and/or use of iterative reconstruction technique. CONTRAST:  112m OMNIPAQUE IOHEXOL 300 MG/ML  SOLN COMPARISON:  CT abdomen pelvis dated 09/22/2020. chest CT dated 01/27/2022. FINDINGS: Lower chest: Multiple pulmonary nodules in the visualized right lung base consistent with metastatic disease. Partially visualized right perihilar nodular densities (1/3) corresponding to the right hilar mass or lymphadenopathy. These findings are better evaluated on the chest CT of 01/27/2022. No intra-abdominal free air.  Small free fluid in the pelvis. Hepatobiliary: Faint small nodular deposit along the liver capsule in the right lobe of the liver (12/3) measuring up to 12 mm most consistent with metastatic implant. No intrahepatic biliary ductal dilatation. The gallbladder is unremarkable. Pancreas: The pancreas is unremarkable. No active inflammatory changes. No dilatation of the main pancreatic duct. Spleen: The spleen is unremarkable. Adrenals/Urinary Tract: The adrenal glands are unremarkable. There is moderate right hydronephrosis and mild right hydroureter secondary to compression and obstruction of the distal right ureter by a metastatic lesion in the right hemipelvis. The left kidney is unremarkable. The urinary bladder is minimally distended and grossly unremarkable. Stomach/Bowel: There is no bowel obstruction or active inflammation. The appendix is normal. Vascular/Lymphatic: Moderate aortoiliac atherosclerotic disease. The IVC is unremarkable. No portal venous gas. No retroperitoneal  adenopathy. Reproductive: Hysterectomy. Other: Several pelvic masses consistent with metastatic disease. The masses demonstrate central low attenuation, likely necrotic tissue. The largest measuring 4.4 x 3.5 x 4.7 cm in the right hemipelvis. This mass causes compression of the distal right ureter with associated right-sided hydronephrosis. Additional metastatic implants in the omentum and in the left upper abdomen measure up to 4.3 x 3.3 cm in axial dimensions (42/3). There is a 4.5 x 2.3 cm metastatic implant in the right lateral abdominal wall musculature (45/3). Faint 1.4 x 1.0 cm nodular density over the left diaphragmatic crus (22/3) suspicious for metastatic implant. Musculoskeletal: There is osteopenia with degenerative changes of the spine. No acute osseous pathology. No suspicious bone lesions. IMPRESSION: 1. Multiple abdominal and pelvic metastatic implants as above. 2. Moderate right hydronephrosis secondary to mass effect and obstruction of the distal right ureter by a right pelvic metastatic implant. 3. Multiple pulmonary metastatic disease in the visualized right lung base. 4. No bowel obstruction. Normal appendix. 5. Aortic Atherosclerosis (ICD10-I70.0). Electronically Signed   By: AAnner CreteM.D.   On: 01/28/2022 03:41   CT Angio Chest PE W and/or Wo Contrast  Result Date: 01/27/2022 CLINICAL DATA:  Pulmonary embolism (  PE) suspected, high prob History of cancer, pleuritic left-sided chest pain, short of breath. Abnormality on chest x-ray CT recommended. Rule out PE versus cancer versus pneumonia versus soft tissue injury EXAM: CT ANGIOGRAPHY CHEST WITH CONTRAST TECHNIQUE: Multidetector CT imaging of the chest was performed using the standard protocol during bolus administration of intravenous contrast. Multiplanar CT image reconstructions and MIPs were obtained to evaluate the vascular anatomy. RADIATION DOSE REDUCTION: This exam was performed according to the departmental  dose-optimization program which includes automated exposure control, adjustment of the mA and/or kV according to patient size and/or use of iterative reconstruction technique. CONTRAST:  56m OMNIPAQUE IOHEXOL 350 MG/ML SOLN COMPARISON:  Chest radiograph earlier today. No prior chest CT FINDINGS: Cardiovascular: There are no filling defects within the pulmonary arteries to suggest pulmonary embolus. Mild aortic atherosclerosis. The heart is normal in size. There are coronary artery calcifications. No pericardial effusion. Mediastinum/Nodes: There is multifocal mediastinal adenopathy. Anterior paratracheal node versus nodal conglomerate measuring 2 cm short axis series 5, image 45. Subcarinal node versus nodal conglomerate measuring 2.8 cm series 5, image 56. There are multiple enlarged right hilar nodes measuring from 14 to 19 mm. There are enlarged left hilar nodes measuring up to 15 mm. There is a 14 mm right epicardial node. The esophagus is decompressed. Lungs/Pleura: Moderate emphysema and bronchial thickening. The right lobar bronchi are attenuated due to adjacent adenopathy. There are multiple pulmonary nodules throughout both lungs of varying sizes. Right perihilar spiculated nodule spanning the fissure measures 4 x 2.3 x 3.6 cm and is cavitating series 6, image 47, series 8, image 77. 2.5 cm left upper lobe nodule corresponds to abnormality on CT. Majority of the additional nodules range from 5-15 mm. Fissural thickening in the left lung. No features of pulmonary edema. There is no endobronchial lesion. No pleural effusion. Upper Abdomen: Limited assessment for focal hepatic lesion given phase of contrast. There is a small soft tissue density medial to the right hepatic lobe posterior to the right kidney series 5, image 127. Right hydronephrosis is partially included in the field of view. Lack of upper abdominal bowel fat limits detailed assessment. Musculoskeletal: Nondisplaced left anterior seventh and  eighth rib fractures. There is some soft tissue thickening adjacent to the seventh rib fracture suggesting this may be pathologic. No chest wall soft tissue abnormalities. Review of the MIP images confirms the above findings. IMPRESSION: 1. No pulmonary embolus. 2. Findings consistent with intrathoracic malignancy. Multiple pulmonary nodules throughout both lungs of varying sizes, consistent with metastatic disease. Primary site of malignancy may represent a 4 cm spiculated nodule in the right perihilar region spanning the fissure that is cavitating. Alternatively all of these nodules may be metastatic given history of cancer, type not specified. 3. Multifocal mediastinal and bilateral hilar adenopathy, consistent metastatic disease. 4. Nondisplaced left anterior seventh and eighth rib fractures, soft tissue thickening adjacent to the seventh rib fracture suggests this may be pathologic 5. Right hydronephrosis is partially included in the field of view in the upper abdomen. Small soft tissue density medial to the right hepatic lobe posterior to the right kidney is nonspecific but may represent metastatic disease. Recommend staging CT of the abdomen and pelvis with oral and IV contrast. 6. Moderate emphysema. 7. Aortic atherosclerosis.  Coronary artery calcifications. Aortic Atherosclerosis (ICD10-I70.0) and Emphysema (ICD10-J43.9). Electronically Signed   By: MKeith RakeM.D.   On: 01/27/2022 23:03

## 2022-01-31 NOTE — Progress Notes (Signed)
Paclitaxel 228 mg IV administered in titration via Port.  Pt doing well.  Report given to Luanne Bras, RN night shift.

## 2022-02-01 DIAGNOSIS — R52 Pain, unspecified: Secondary | ICD-10-CM | POA: Diagnosis not present

## 2022-02-01 LAB — BASIC METABOLIC PANEL
Anion gap: 8 (ref 5–15)
BUN: 19 mg/dL (ref 6–20)
CO2: 26 mmol/L (ref 22–32)
Calcium: 8.6 mg/dL — ABNORMAL LOW (ref 8.9–10.3)
Chloride: 104 mmol/L (ref 98–111)
Creatinine, Ser: 1.02 mg/dL — ABNORMAL HIGH (ref 0.44–1.00)
GFR, Estimated: >60 mL/min (ref >60)
Glucose, Bld: 131 mg/dL — ABNORMAL HIGH (ref 70–99)
Potassium: 3.8 mmol/L (ref 3.5–5.1)
Sodium: 138 mmol/L (ref 135–145)

## 2022-02-01 LAB — CBC
HCT: 37.4 % (ref 36.0–46.0)
Hemoglobin: 12 g/dL (ref 12.0–15.0)
MCH: 26.7 pg (ref 26.0–34.0)
MCHC: 32.1 g/dL (ref 30.0–36.0)
MCV: 83.1 fL (ref 80.0–100.0)
Platelets: 273 10*3/uL (ref 150–400)
RBC: 4.5 MIL/uL (ref 3.87–5.11)
RDW: 14.7 % (ref 11.5–15.5)
WBC: 5.4 10*3/uL (ref 4.0–10.5)
nRBC: 0 % (ref 0.0–0.2)

## 2022-02-01 LAB — GLUCOSE, CAPILLARY
Glucose-Capillary: 179 mg/dL — ABNORMAL HIGH (ref 70–99)
Glucose-Capillary: 327 mg/dL — ABNORMAL HIGH (ref 70–99)

## 2022-02-01 MED ORDER — HEPARIN SOD (PORK) LOCK FLUSH 100 UNIT/ML IV SOLN
500.0000 [IU] | INTRAVENOUS | Status: AC | PRN
  Administered 2022-02-01: 500 [IU]

## 2022-02-01 NOTE — Progress Notes (Signed)
AVS given to patient. Reviewed discharge instructions and medications. All questions answered. Port-a-cath heparinized and deaccessed, bandaid placed. Patient walked to car, refused wheelchair.

## 2022-02-01 NOTE — Discharge Summary (Signed)
Physician Discharge Summary  Krista [name]:[id number] DOB: 1961-01-02 DOA: 01/27/2022  PCP: Drue Flirt, MD  Admit date: 01/27/2022 Discharge date: 02/01/2022  Admitted From: Home Disposition: Home  Recommendations for Outpatient Follow-up:  Follow up with PCP in 1-2 weeks Please obtain BMP/CBC in one week Oncology to schedule follow-up  Home Health: N/A Equipment/Devices: N/A  Discharge Condition: Stable CODE STATUS: DNR Diet recommendation: Regular diet  Discharge summary:  Patient has history of endometrial carcinoma, coronary artery disease and prior myocardial infarction, hypertension and hyperlipidemia admitted through emergency room with left-sided chest pain after falling on her left side a week ago, hemoptysis 3 weeks ago and productive cough with yellow sputum.  She was found to have multiple pulmonary nodules, multifocal mediastinal and bilateral hilar adenopathy consistent with metastatic disease and multiple abdominal and pelvic implants.  She was admitted for pain control related to her rib fractures and metastatic disease, she was followed by hematology oncology, cardiology.  Patient was symptomatically treated, underwent port placement on 6/20 and received chemotherapy with carboplatin.  After receiving palliative chemotherapy, she is medically stable and discharged home with plan for outpatient follow-up with oncology.  Patient will be prescribed adequate pain management, she is on aspirin and Plavix as well as metoprolol, she is on Repatha that she will continue.  She will have ongoing follow-up with oncology and palliative care at oncology clinic.  Discharge Diagnoses:  Principal Problem:   Generalized pain Active Problems:   Cancer related pain   Rib fractures   Metastatic disease ([place])   Endometrial cancer ([name])   Hydronephrosis of right kidney   Hemoptysis   Elevated troponin   Cough   Elevated serum creatinine   Protein-calorie malnutrition,  moderate ([name])   TOBACCO ABUSE   Type 2 diabetes mellitus ([name])   Goals of care, counseling/discussion   Essential hypertension    Discharge Instructions  Discharge Instructions     Amb Referral to Palliative Care   Complete by: As directed    Call MD for:  severe uncontrolled pain   Complete by: As directed    Diet - low sodium heart healthy   Complete by: As directed    Discharge instructions   Complete by: As directed    Follow-up with your oncologist as scheduled call the office for an appointment Please call call MD or return to ER for similar or worsening recurring problem that brought you to hospital or if any fever,nausea/vomiting,abdominal pain, uncontrolled pain, chest pain,  shortness of breath or any other alarming symptoms.  Please follow-up your doctor as instructed in a week time and call the office for appointment.  Please avoid alcohol, smoking, or any other illicit substance and maintain healthy habits including taking your regular medications as prescribed.  You were cared for by a hospitalist during your hospital stay. If you have any questions about your discharge medications or the care you received while you were in the hospital after you are discharged, you can call the unit and ask to speak with the hospitalist on call if the hospitalist that took care of you is not available.  Once you are discharged, your primary care physician will handle any further medical issues. Please note that NO REFILLS for any discharge medications will be authorized once you are discharged, as it is imperative that you return to your primary care physician (or establish a relationship with a primary care physician if you do not have one) for your aftercare needs so that they  can reassess your need for medications and monitor your lab values   Increase activity slowly   Complete by: As directed    Increase activity slowly   Complete by: As directed    No wound care   Complete by:  As directed    No wound care   Complete by: As directed    TREATMENT CONDITIONS   Complete by: As directed    Patient should have CBC & CMP within 7 days prior to chemotherapy administration. NOTIFY MD IF: ANC < 1500, Hemoglobin < 8, PLT < 100,000,  Total Bili > 1.5, Creatinine > 1.5, ALT & AST > 80 or if patient has unstable vital signs: Temperature > 38.5, SBP > 180 or < 90, RR > 30 or HR > 100.      Allergies as of 02/01/2022       Reactions   Statins    itching   Zetia [ezetimibe] Itching        Medication List     STOP taking these medications    diltiazem 180 MG 24 hr capsule Commonly known as: DILACOR XR       TAKE these medications    acetaminophen 500 MG tablet Commonly known as: TYLENOL Take 1,000 mg by mouth every 6 (six) hours as needed for mild pain or headache.   amLODipine 5 MG tablet Commonly known as: NORVASC Take 1 tablet (5 mg total) by mouth daily.   aspirin EC 81 MG tablet Take 1 tablet (81 mg total) by mouth daily.   clopidogrel 75 MG tablet Commonly known as: PLAVIX Take 1 tablet (75 mg total) by mouth daily.   lidocaine 5 % Commonly known as: LIDODERM Place 2 patches onto the skin daily. Remove & Discard patch within 12 hours or as directed by MD   loratadine 10 MG tablet Commonly known as: CLARITIN Take 1 tablet (10 mg total) by mouth daily as needed for allergies.   metFORMIN 1000 MG tablet Commonly known as: GLUCOPHAGE Take 1,000 mg by mouth 2 (two) times daily with a meal.   metoprolol tartrate 25 MG tablet Commonly known as: LOPRESSOR Take 1 tablet (25 mg total) by mouth 2 (two) times daily.   nitroGLYCERIN 0.4 MG SL tablet Commonly known as: NITROSTAT Place 1 tablet (0.4 mg total) under the tongue every 5 (five) minutes as needed for chest pain.   oxyCODONE 5 MG immediate release tablet Commonly known as: Oxy IR/ROXICODONE Take 1 tablet (5 mg total) by mouth every 6 (six) hours as needed for up to 5 days for severe  pain.   Repatha SureClick 502 MG/ML Soaj Generic drug: Evolocumab Inject 140 mg into the skin every 14 (fourteen) days.        Follow-up Information     Drue Flirt, MD Follow up in 1 week(s).   Specialty: Family Medicine Contact information: [address] [address] [phone]         Lorretta Harp, MD .   Specialties: Cardiology, Radiology Contact information: [address] [place] [address] [phone]         Heath Lark, MD Follow up in 1 day(s).   Specialty: Hematology and Oncology Why: call office Contact information: [place] [address] [place] Follow up in 2 week(s).   Why: Call office for follow-up appointment Contact information: [place]  [phone]               Allergies  Allergen Reactions   Statins     itching   Zetia [Ezetimibe] Itching    Consultations: Oncology   Procedures/Studies: IR IMAGING GUIDED PORT INSERTION  Result Date: 01/31/2022 CLINICAL DATA:  Short malfunctioning right subclavian power port catheter EXAM: ULTRASOUND GUIDANCE FOR VASCULAR ACCESS RIGHT IJ POWER PORT CATHETER INSERTION (UTILIZING THE EXISTING RIGHT CHEST PORT POCKET) Date:  01/31/2022 01/31/2022 9:48 am Radiologist:  Jerilynn Mages. Daryll Brod, MD Guidance:  ULTRASOUND AND FLUOROSCOPIC MEDICATIONS: 1% lidocaine local with epinephrine. ANESTHESIA/SEDATION: Versed 3.0 mg IV; Fentanyl 125 mcg IV; Moderate Sedation Time:  42 minute The patient was continuously monitored during the procedure by the interventional radiology nurse under my direct supervision. FLUOROSCOPY: Fluoroscopy Time:(2.0 mGy). CONTRAST:  None. COMPLICATIONS: None immediate. PROCEDURE: Informed consent was obtained from the patient following explanation of the procedure, risks, benefits and alternatives. The patient understands,  agrees and consents for the procedure. All questions were addressed. A time out was performed. Under sterile conditions and local anesthesia, right internal jugular micropuncture venous access was performed with ultrasound. Images were obtained for documentation. A guide wire was inserted followed by a 4-French dilator. A catheter and guidewire were utilized to access the IVC. The existing right subclavian power port catheter was removed. This was done under sterile conditions and local anesthesia. An incision was made along the existing port catheter scar. Port catheter was removed with sharp and blunt dissection from the subcutaneous pocket. Pocket was flushed with saline and suspected. No signs of infection. Adequate hemostasis. Subcutaneous pocket can be utilized for revision. Measurements were obtained from the SVC/RA junction back to the venotomy site. The port catheter was assembled and checked for leakage. The port catheter was secured in the existing pocket with two retention Ethilon sutures. The tubing was tunneled subcutaneously to the new right IJ venotomy site and inserted into the SVC/RA junction through a valved peel-away sheath. Position was confirmed with fluoroscopy. No immediate complications. The patient tolerated the procedure well. Incisions were closed with Vicryl suture and Dermabond. The port catheter was accessed, blood was aspirated followed by saline and heparin flushes. Needle was removed. A sterile dressing was applied. IMPRESSION: Successful ultrasound and fluoroscopic right IJ power port catheter insertion utilizing the existing right subclavian port catheter pocket. Malpositioned right subclavian port catheter was removed. Electronically Signed   By: Jerilynn Mages.  Shick M.D.   On: 01/31/2022 13:45   IR REMOVAL TUN ACCESS W/ PORT W/O FL MOD SED  Result Date: 01/31/2022 CLINICAL DATA:  Short malfunctioning right subclavian power port catheter EXAM: ULTRASOUND GUIDANCE FOR VASCULAR ACCESS  RIGHT IJ POWER PORT CATHETER INSERTION (UTILIZING THE EXISTING RIGHT CHEST PORT POCKET) Date:  01/31/2022 01/31/2022 9:48 am Radiologist:  Jerilynn Mages. Daryll Brod, MD Guidance:  ULTRASOUND AND FLUOROSCOPIC MEDICATIONS: 1% lidocaine local with epinephrine. ANESTHESIA/SEDATION: Versed 3.0 mg IV; Fentanyl 125 mcg IV; Moderate Sedation Time:  42 minute The patient was continuously monitored during the procedure by the interventional radiology nurse under my direct supervision. FLUOROSCOPY: Fluoroscopy Time:(2.0 mGy). CONTRAST:  None. COMPLICATIONS: None immediate. PROCEDURE: Informed consent was obtained from the patient following explanation of the procedure, risks, benefits and alternatives. The patient understands, agrees and consents for the procedure. All questions were addressed. A time out was performed. Under sterile conditions and local anesthesia, right internal jugular micropuncture venous access was performed with ultrasound. Images were obtained for documentation. A guide wire was inserted followed by a 4-French  dilator. A catheter and guidewire were utilized to access the IVC. The existing right subclavian power port catheter was removed. This was done under sterile conditions and local anesthesia. An incision was made along the existing port catheter scar. Port catheter was removed with sharp and blunt dissection from the subcutaneous pocket. Pocket was flushed with saline and suspected. No signs of infection. Adequate hemostasis. Subcutaneous pocket can be utilized for revision. Measurements were obtained from the SVC/RA junction back to the venotomy site. The port catheter was assembled and checked for leakage. The port catheter was secured in the existing pocket with two retention Ethilon sutures. The tubing was tunneled subcutaneously to the new right IJ venotomy site and inserted into the SVC/RA junction through a valved peel-away sheath. Position was confirmed with fluoroscopy. No immediate complications. The  patient tolerated the procedure well. Incisions were closed with Vicryl suture and Dermabond. The port catheter was accessed, blood was aspirated followed by saline and heparin flushes. Needle was removed. A sterile dressing was applied. IMPRESSION: Successful ultrasound and fluoroscopic right IJ power port catheter insertion utilizing the existing right subclavian port catheter pocket. Malpositioned right subclavian port catheter was removed. Electronically Signed   By: Jerilynn Mages.  Shick M.D.   On: 01/31/2022 13:45   ECHOCARDIOGRAM COMPLETE  Result Date: 01/29/2022    ECHOCARDIOGRAM REPORT   Patient Name:   Krista [name] [facility] Date of Exam: 01/29/2022 Medical Rec #:  [id number]        Height:       63.0 in Accession #:    [id number]       Weight:       150.0 lb Date of Birth:  [birth year]       BSA:          69.711 m Patient Age:    38 years         BP:           132/89 mmHg Patient Gender: F                HR:           70 bpm. Exam Location:  Inpatient Procedure: 2D Echo, Cardiac Doppler and Color Doppler Indications:    Elevated Troponin  History:        Patient has prior history of Echocardiogram examinations, most                 recent 05/13/2019. Previous Myocardial Infarction and CAD; Risk                 Factors:Hypertension and Diabetes.  Sonographer:    Bernadene Person RDCS Referring Phys: [id number] [place]  1. Left ventricular ejection fraction, by estimation, is 55 to 60%. The left ventricle has normal function. The left ventricle has no regional wall motion abnormalities. There is moderate concentric left ventricular hypertrophy. Left ventricular diastolic parameters are consistent with Grade I diastolic dysfunction (impaired relaxation).  2. Right ventricular systolic function is normal. The right ventricular size is normal. There is normal pulmonary artery systolic pressure.  3. Left atrial size was mildly dilated.  4. The mitral valve is normal in structure. Trivial mitral valve regurgitation.  No evidence of mitral stenosis.  5. The aortic valve is tricuspid. Aortic valve regurgitation is trivial. No aortic stenosis is present.  6. Aortic dilatation noted. There is mild dilatation of the aortic root, measuring 39 mm.  7. The inferior vena cava is normal in size with <50% respiratory variability,  suggesting right atrial pressure of 8 mmHg. FINDINGS  Left Ventricle: Left ventricular ejection fraction, by estimation, is 55 to 60%. The left ventricle has normal function. The left ventricle has no regional wall motion abnormalities. The left ventricular internal cavity size was normal in size. There is  moderate concentric left ventricular hypertrophy. Left ventricular diastolic parameters are consistent with Grade I diastolic dysfunction (impaired relaxation). Indeterminate filling pressures. Right Ventricle: The right ventricular size is normal. No increase in right ventricular wall thickness. Right ventricular systolic function is normal. There is normal pulmonary artery systolic pressure. The tricuspid regurgitant velocity is 1.46 m/s, and  with an assumed right atrial pressure of 8 mmHg, the estimated right ventricular systolic pressure is 50.2 mmHg. Left Atrium: Left atrial size was mildly dilated. Right Atrium: Right atrial size was normal in size. Pericardium: There is no evidence of pericardial effusion. Mitral Valve: The mitral valve is normal in structure. Trivial mitral valve regurgitation. No evidence of mitral valve stenosis. Tricuspid Valve: The tricuspid valve is normal in structure. Tricuspid valve regurgitation is trivial. No evidence of tricuspid stenosis. Aortic Valve: The aortic valve is tricuspid. Aortic valve regurgitation is trivial. No aortic stenosis is present. Pulmonic Valve: The pulmonic valve was normal in structure. Pulmonic valve regurgitation is not visualized. No evidence of pulmonic stenosis. Aorta: Aortic dilatation noted. There is mild dilatation of the aortic root,  measuring 39 mm. Venous: The inferior vena cava is normal in size with less than 50% respiratory variability, suggesting right atrial pressure of 8 mmHg. IAS/Shunts: No atrial level shunt detected by color flow Doppler.  LEFT VENTRICLE PLAX 2D LVIDd:         4.00 cm     Diastology LVIDs:         2.50 cm     LV e' medial:    4.05 cm/s LV PW:         1.30 cm     LV E/e' medial:  10.4 LV IVS:        1.30 cm     LV e' lateral:   5.45 cm/s LVOT diam:     2.20 cm     LV E/e' lateral: 7.7 LV SV:         85 LV SV Index:   50 LVOT Area:     3.80 cm  LV Volumes (MOD) LV vol d, MOD A2C: 81.8 ml LV vol d, MOD A4C: 90.6 ml LV vol s, MOD A2C: 32.0 ml LV vol s, MOD A4C: 39.3 ml LV SV MOD A2C:     49.8 ml LV SV MOD A4C:     90.6 ml LV SV MOD BP:      52.9 ml RIGHT VENTRICLE RV S prime:     14.30 cm/s TAPSE (M-mode): 2.1 cm LEFT ATRIUM             Index        RIGHT ATRIUM           Index LA diam:        3.70 cm 2.16 cm/m   RA Area:     15.60 cm LA Vol (A2C):   55.3 ml 32.32 ml/m  RA Volume:   38.90 ml  22.73 ml/m LA Vol (A4C):   47.9 ml 27.99 ml/m LA Biplane Vol: 53.4 ml 31.21 ml/m  AORTIC VALVE LVOT Vmax:   114.00 cm/s LVOT Vmean:  73.300 cm/s LVOT VTI:    0.223 m  AORTA Ao Root diam: 3.90 cm  MITRAL VALVE               TRICUSPID VALVE MV Area (PHT): 2.00 cm    TR Peak grad:   8.5 mmHg MV Decel Time: 380 msec    TR Vmax:        146.00 cm/s MV E velocity: 42.20 cm/s MV A velocity: 79.50 cm/s  SHUNTS MV E/A ratio:  0.53        Systemic VTI:  0.22 m                            Systemic Diam: 2.20 cm Skeet Latch MD Electronically signed by Skeet Latch MD Signature Date/Time: 01/29/2022/10:57:38 AM    Final    DG Chest 2 View  Addendum Date: 01/28/2022   ADDENDUM REPORT: 01/28/2022 15:05 ADDENDUM: Tip of right chest port is seen in the region of right innominate vein. Electronically Signed   By: Elmer Picker M.D.   On: 01/28/2022 15:05   Result Date: 01/28/2022 CLINICAL DATA:  Shortness of breath, cough  EXAM: CHEST - 2 VIEW COMPARISON:  Previous studies including the examination of 05/14/2019 FINDINGS: Cardiac size is within normal limits. Thoracic aorta is tortuous. Right hilum appears more prominent. There is 2.8 cm smooth marginated nodule in the left upper lung fields adjacent to the left hilum.Rest of the lung fields are essentially clear. There is no pleural effusion or pneumothorax. IMPRESSION: There is 2.8 cm nodular density in the left parahilar region. Possibility of malignant neoplasm is not excluded. Follow-up CT chest may be considered. Right hilum appears more prominent. This may be due to prominent central pulmonary vessels or lymphadenopathy. Electronically Signed: By: Elmer Picker M.D. On: 01/27/2022 17:45   CT ABDOMEN PELVIS W CONTRAST  Result Date: 01/28/2022 CLINICAL DATA:  Evaluate for metastatic disease. EXAM: CT ABDOMEN AND PELVIS WITH CONTRAST TECHNIQUE: Multidetector CT imaging of the abdomen and pelvis was performed using the standard protocol following bolus administration of intravenous contrast. RADIATION DOSE REDUCTION: This exam was performed according to the departmental dose-optimization program which includes automated exposure control, adjustment of the mA and/or kV according to patient size and/or use of iterative reconstruction technique. CONTRAST:  11m OMNIPAQUE IOHEXOL 300 MG/ML  SOLN COMPARISON:  CT abdomen pelvis dated 09/22/2020. chest CT dated 01/27/2022. FINDINGS: Lower chest: Multiple pulmonary nodules in the visualized right lung base consistent with metastatic disease. Partially visualized right perihilar nodular densities (1/3) corresponding to the right hilar mass or lymphadenopathy. These findings are better evaluated on the chest CT of 01/27/2022. No intra-abdominal free air.  Small free fluid in the pelvis. Hepatobiliary: Faint small nodular deposit along the liver capsule in the right lobe of the liver (12/3) measuring up to 12 mm most consistent  with metastatic implant. No intrahepatic biliary ductal dilatation. The gallbladder is unremarkable. Pancreas: The pancreas is unremarkable. No active inflammatory changes. No dilatation of the main pancreatic duct. Spleen: The spleen is unremarkable. Adrenals/Urinary Tract: The adrenal glands are unremarkable. There is moderate right hydronephrosis and mild right hydroureter secondary to compression and obstruction of the distal right ureter by a metastatic lesion in the right hemipelvis. The left kidney is unremarkable. The urinary bladder is minimally distended and grossly unremarkable. Stomach/Bowel: There is no bowel obstruction or active inflammation. The appendix is normal. Vascular/Lymphatic: Moderate aortoiliac atherosclerotic disease. The IVC is unremarkable. No portal venous gas. No retroperitoneal adenopathy. Reproductive: Hysterectomy. Other: Several pelvic masses consistent with metastatic disease. The  masses demonstrate central low attenuation, likely necrotic tissue. The largest measuring 4.4 x 3.5 x 4.7 cm in the right hemipelvis. This mass causes compression of the distal right ureter with associated right-sided hydronephrosis. Additional metastatic implants in the omentum and in the left upper abdomen measure up to 4.3 x 3.3 cm in axial dimensions (42/3). There is a 4.5 x 2.3 cm metastatic implant in the right lateral abdominal wall musculature (45/3). Faint 1.4 x 1.0 cm nodular density over the left diaphragmatic crus (22/3) suspicious for metastatic implant. Musculoskeletal: There is osteopenia with degenerative changes of the spine. No acute osseous pathology. No suspicious bone lesions. IMPRESSION: 1. Multiple abdominal and pelvic metastatic implants as above. 2. Moderate right hydronephrosis secondary to mass effect and obstruction of the distal right ureter by a right pelvic metastatic implant. 3. Multiple pulmonary metastatic disease in the visualized right lung base. 4. No bowel  obstruction. Normal appendix. 5. Aortic Atherosclerosis (ICD10-I70.0). Electronically Signed   By: Anner Crete M.D.   On: 01/28/2022 03:41   CT Angio Chest PE W and/or Wo Contrast  Result Date: 01/27/2022 CLINICAL DATA:  Pulmonary embolism (PE) suspected, high prob History of cancer, pleuritic left-sided chest pain, short of breath. Abnormality on chest x-ray CT recommended. Rule out PE versus cancer versus pneumonia versus soft tissue injury EXAM: CT ANGIOGRAPHY CHEST WITH CONTRAST TECHNIQUE: Multidetector CT imaging of the chest was performed using the standard protocol during bolus administration of intravenous contrast. Multiplanar CT image reconstructions and MIPs were obtained to evaluate the vascular anatomy. RADIATION DOSE REDUCTION: This exam was performed according to the departmental dose-optimization program which includes automated exposure control, adjustment of the mA and/or kV according to patient size and/or use of iterative reconstruction technique. CONTRAST:  43m OMNIPAQUE IOHEXOL 350 MG/ML SOLN COMPARISON:  Chest radiograph earlier today. No prior chest CT FINDINGS: Cardiovascular: There are no filling defects within the pulmonary arteries to suggest pulmonary embolus. Mild aortic atherosclerosis. The heart is normal in size. There are coronary artery calcifications. No pericardial effusion. Mediastinum/Nodes: There is multifocal mediastinal adenopathy. Anterior paratracheal node versus nodal conglomerate measuring 2 cm short axis series 5, image 45. Subcarinal node versus nodal conglomerate measuring 2.8 cm series 5, image 56. There are multiple enlarged right hilar nodes measuring from 14 to 19 mm. There are enlarged left hilar nodes measuring up to 15 mm. There is a 14 mm right epicardial node. The esophagus is decompressed. Lungs/Pleura: Moderate emphysema and bronchial thickening. The right lobar bronchi are attenuated due to adjacent adenopathy. There are multiple pulmonary  nodules throughout both lungs of varying sizes. Right perihilar spiculated nodule spanning the fissure measures 4 x 2.3 x 3.6 cm and is cavitating series 6, image 47, series 8, image 77. 2.5 cm left upper lobe nodule corresponds to abnormality on CT. Majority of the additional nodules range from 5-15 mm. Fissural thickening in the left lung. No features of pulmonary edema. There is no endobronchial lesion. No pleural effusion. Upper Abdomen: Limited assessment for focal hepatic lesion given phase of contrast. There is a small soft tissue density medial to the right hepatic lobe posterior to the right kidney series 5, image 127. Right hydronephrosis is partially included in the field of view. Lack of upper abdominal bowel fat limits detailed assessment. Musculoskeletal: Nondisplaced left anterior seventh and eighth rib fractures. There is some soft tissue thickening adjacent to the seventh rib fracture suggesting this may be pathologic. No chest wall soft tissue abnormalities. Review of the MIP images confirms  the above findings. IMPRESSION: 1. No pulmonary embolus. 2. Findings consistent with intrathoracic malignancy. Multiple pulmonary nodules throughout both lungs of varying sizes, consistent with metastatic disease. Primary site of malignancy may represent a 4 cm spiculated nodule in the right perihilar region spanning the fissure that is cavitating. Alternatively all of these nodules may be metastatic given history of cancer, type not specified. 3. Multifocal mediastinal and bilateral hilar adenopathy, consistent metastatic disease. 4. Nondisplaced left anterior seventh and eighth rib fractures, soft tissue thickening adjacent to the seventh rib fracture suggests this may be pathologic 5. Right hydronephrosis is partially included in the field of view in the upper abdomen. Small soft tissue density medial to the right hepatic lobe posterior to the right kidney is nonspecific but may represent metastatic disease.  Recommend staging CT of the abdomen and pelvis with oral and IV contrast. 6. Moderate emphysema. 7. Aortic atherosclerosis.  Coronary artery calcifications. Aortic Atherosclerosis (ICD10-I70.0) and Emphysema (ICD10-J43.9). Electronically Signed   By: Keith Rake M.D.   On: 01/27/2022 23:03   (Echo, Carotid, EGD, Colonoscopy, ERCP)    Subjective: Patient seen and examined.  Mild chest discomfort mostly on the right side.  Eager to go home.  No other overnight events.   Discharge Exam: Vitals:   01/31/22 2315 02/01/22 0643  BP: (!) 159/89 (!) 164/94  Pulse: 60 74  Resp:  16  Temp:  98.3 F (36.8 C)  SpO2:  98%   Vitals:   01/31/22 1803 01/31/22 2139 01/31/22 2315 02/01/22 0643  BP: (!) 153/79 (!) 183/97 (!) 159/89 (!) 164/94  Pulse: 67 74 60 74  Resp:  14  16  Temp: 98.1 F (36.7 C) 98.5 F (36.9 C)  98.3 F (36.8 C)  TempSrc: Oral Oral  Oral  SpO2: 100% 93%  98%  Weight:      Height:        General: Pt is alert, awake, not in acute distress Cardiovascular: RRR, S1/S2 +, no rubs, no gallops Respiratory: CTA bilaterally, no wheezing, no rhonchi, right subclavian Port-A-Cath present. Abdominal: Soft, NT, ND, bowel sounds + Extremities: no edema, no cyanosis    The results of significant diagnostics from this hospitalization (including imaging, microbiology, ancillary and laboratory) are listed below for reference.     Microbiology: Recent Results (from the past 240 hour(s))  MRSA Next Gen by PCR, Nasal     Status: None   Collection Time: 01/28/22 10:05 AM   Specimen: Nasal Mucosa; Nasal Swab  Result Value Ref Range Status   MRSA by PCR Next Gen NOT DETECTED NOT DETECTED Final    Comment: (NOTE) The GeneXpert MRSA Assay (FDA approved for NASAL specimens only), is one component of a comprehensive MRSA colonization surveillance program. It is not intended to diagnose MRSA infection nor to guide or monitor treatment for MRSA infections. Test performance is not FDA  approved in patients less than 58 years old. Performed at [facility], [place] [address], [place], [place] [address]      Labs: BNP (last 3 results) No results for input(s): "BNP" in the last 8760 hours. Basic Metabolic Panel: Recent Labs  Lab 01/27/22 1709 01/28/22 2951 01/29/22 0046 01/30/22 0429 01/31/22 2218 02/01/22 0508  NA 136 135 138 139  --  138  K 3.8 3.8 4.3 4.2  --  3.8  CL 103 101 104 104  --  104  CO2 '24 23 24 26  '$ --  26  GLUCOSE 202* 137* 108* 124* 458* 131*  BUN '14 11 18 17  '$ --  19  CREATININE 1.17* 1.15* 1.24* 1.27*  --  1.02*  CALCIUM 8.9 8.6* 8.9 8.7*  --  8.6*  MG  --  1.8 1.9 1.8  --   --   PHOS  --  3.8 4.0 3.3  --   --    Liver Function Tests: Recent Labs  Lab 01/27/22 1709 01/28/22 0608 01/29/22 0046 01/30/22 0429  AST 15 14* 16 19  ALT '12 11 14 11  '$ ALKPHOS 56 53 52 45  BILITOT 0.5 0.4 0.4 0.6  PROT 7.3 7.0 6.6 6.7  ALBUMIN 3.0* 2.9* 2.7* 2.8*   Recent Labs  Lab 01/27/22 2133  LIPASE 56*   No results for input(s): "AMMONIA" in the last 168 hours. CBC: Recent Labs  Lab 01/27/22 1709 01/28/22 0608 01/29/22 0046 01/30/22 0429 02/01/22 0508  WBC 7.3 6.9 4.8 5.5 5.4  NEUTROABS 4.0 3.8 2.3 3.0  --   HGB 12.7 12.4 11.6* 11.8* 12.0  HCT 39.7 36.7 35.8* 36.7 37.4  MCV 85.4 82.8 84.0 84.0 83.1  PLT 323 274 260 256 273   Cardiac Enzymes: No results for input(s): "CKTOTAL", "CKMB", "CKMBINDEX", "TROPONINI" in the last 168 hours. BNP: Invalid input(s): "POCBNP" CBG: Recent Labs  Lab 01/31/22 1120 01/31/22 1645 01/31/22 2141 02/01/22 0053 02/01/22 0755  GLUCAP 148* 147* 408* 327* 179*   D-Dimer No results for input(s): "DDIMER" in the last 72 hours. Hgb A1c Recent Labs    01/30/22 0429  HGBA1C 8.1*   Lipid Profile Recent Labs    01/30/22 0429  CHOL 134  HDL 37*  LDLCALC 82  TRIG 77  CHOLHDL 3.6   Thyroid function studies No results for input(s): "TSH", "T4TOTAL", "T3FREE", "THYROIDAB" in the last 72  hours.  Invalid input(s): "FREET3" Anemia work up No results for input(s): "VITAMINB12", "FOLATE", "FERRITIN", "TIBC", "IRON", "RETICCTPCT" in the last 72 hours. Urinalysis    Component Value Date/Time   COLORURINE YELLOW 09/22/2020 1859   APPEARANCEUR CLEAR 09/22/2020 1859   LABSPEC <1.005 (L) 09/22/2020 1859   PHURINE 6.0 09/22/2020 1859   GLUCOSEU >=500 (A) 09/22/2020 1859   HGBUR NEGATIVE 09/22/2020 1859   HGBUR negative 09/21/2009 1036   BILIRUBINUR NEGATIVE 09/22/2020 1859   KETONESUR NEGATIVE 09/22/2020 1859   PROTEINUR NEGATIVE 09/22/2020 1859   UROBILINOGEN 0.2 09/21/2009 1036   NITRITE NEGATIVE 09/22/2020 1859   LEUKOCYTESUR NEGATIVE 09/22/2020 1859   Sepsis Labs Recent Labs  Lab 01/28/22 9509 01/29/22 0046 01/30/22 0429 02/01/22 0508  WBC 6.9 4.8 5.5 5.4   Microbiology Recent Results (from the past 240 hour(s))  MRSA Next Gen by PCR, Nasal     Status: None   Collection Time: 01/28/22 10:05 AM   Specimen: Nasal Mucosa; Nasal Swab  Result Value Ref Range Status   MRSA by PCR Next Gen NOT DETECTED NOT DETECTED Final    Comment: (NOTE) The GeneXpert MRSA Assay (FDA approved for NASAL specimens only), is one component of a comprehensive MRSA colonization surveillance program. It is not intended to diagnose MRSA infection nor to guide or monitor treatment for MRSA infections. Test performance is not FDA approved in patients less than 57 years old. Performed at [facility], [place] [address] [place], [place] [address]      Time coordinating discharge: 35 minutes  SIGNED:   Barb Merino, MD  [facility] 02/01/2022, 8:44 AM

## 2022-02-09 ENCOUNTER — Other Ambulatory Visit (HOSPITAL_COMMUNITY): Payer: Self-pay

## 2022-02-09 ENCOUNTER — Inpatient Hospital Stay: Payer: Medicare Other | Attending: Hematology and Oncology

## 2022-02-09 ENCOUNTER — Inpatient Hospital Stay (HOSPITAL_BASED_OUTPATIENT_CLINIC_OR_DEPARTMENT_OTHER): Payer: Medicare Other | Admitting: Hematology and Oncology

## 2022-02-09 ENCOUNTER — Encounter: Payer: Self-pay | Admitting: Hematology and Oncology

## 2022-02-09 ENCOUNTER — Other Ambulatory Visit: Payer: Self-pay

## 2022-02-09 ENCOUNTER — Inpatient Hospital Stay: Payer: Medicare Other

## 2022-02-09 ENCOUNTER — Encounter: Payer: Self-pay | Admitting: Nurse Practitioner

## 2022-02-09 ENCOUNTER — Inpatient Hospital Stay (HOSPITAL_BASED_OUTPATIENT_CLINIC_OR_DEPARTMENT_OTHER): Payer: Medicare Other | Admitting: Nurse Practitioner

## 2022-02-09 VITALS — BP 145/89 | HR 81 | Temp 98.0°F | Resp 16 | Ht 63.0 in | Wt 155.9 lb

## 2022-02-09 DIAGNOSIS — G893 Neoplasm related pain (acute) (chronic): Secondary | ICD-10-CM

## 2022-02-09 DIAGNOSIS — Z91199 Patient's noncompliance with other medical treatment and regimen due to unspecified reason: Secondary | ICD-10-CM

## 2022-02-09 DIAGNOSIS — K5909 Other constipation: Secondary | ICD-10-CM | POA: Diagnosis not present

## 2022-02-09 DIAGNOSIS — Z79899 Other long term (current) drug therapy: Secondary | ICD-10-CM | POA: Diagnosis not present

## 2022-02-09 DIAGNOSIS — D61818 Other pancytopenia: Secondary | ICD-10-CM | POA: Diagnosis not present

## 2022-02-09 DIAGNOSIS — N183 Chronic kidney disease, stage 3 unspecified: Secondary | ICD-10-CM | POA: Insufficient documentation

## 2022-02-09 DIAGNOSIS — R918 Other nonspecific abnormal finding of lung field: Secondary | ICD-10-CM | POA: Insufficient documentation

## 2022-02-09 DIAGNOSIS — C541 Malignant neoplasm of endometrium: Secondary | ICD-10-CM | POA: Insufficient documentation

## 2022-02-09 DIAGNOSIS — Z515 Encounter for palliative care: Secondary | ICD-10-CM

## 2022-02-09 DIAGNOSIS — Z7189 Other specified counseling: Secondary | ICD-10-CM

## 2022-02-09 DIAGNOSIS — K5903 Drug induced constipation: Secondary | ICD-10-CM

## 2022-02-09 DIAGNOSIS — E1122 Type 2 diabetes mellitus with diabetic chronic kidney disease: Secondary | ICD-10-CM | POA: Diagnosis not present

## 2022-02-09 DIAGNOSIS — Z79891 Long term (current) use of opiate analgesic: Secondary | ICD-10-CM | POA: Diagnosis not present

## 2022-02-09 DIAGNOSIS — Z87891 Personal history of nicotine dependence: Secondary | ICD-10-CM | POA: Insufficient documentation

## 2022-02-09 DIAGNOSIS — I129 Hypertensive chronic kidney disease with stage 1 through stage 4 chronic kidney disease, or unspecified chronic kidney disease: Secondary | ICD-10-CM | POA: Insufficient documentation

## 2022-02-09 LAB — CBC WITH DIFFERENTIAL (CANCER CENTER ONLY)
Abs Immature Granulocytes: 0.01 10*3/uL (ref 0.00–0.07)
Basophils Absolute: 0 10*3/uL (ref 0.0–0.1)
Basophils Relative: 1 %
Eosinophils Absolute: 0.1 10*3/uL (ref 0.0–0.5)
Eosinophils Relative: 2 %
HCT: 31.8 % — ABNORMAL LOW (ref 36.0–46.0)
Hemoglobin: 10.5 g/dL — ABNORMAL LOW (ref 12.0–15.0)
Immature Granulocytes: 0 %
Lymphocytes Relative: 41 %
Lymphs Abs: 1.4 10*3/uL (ref 0.7–4.0)
MCH: 27.3 pg (ref 26.0–34.0)
MCHC: 33 g/dL (ref 30.0–36.0)
MCV: 82.8 fL (ref 80.0–100.0)
Monocytes Absolute: 0.2 10*3/uL (ref 0.1–1.0)
Monocytes Relative: 6 %
Neutro Abs: 1.7 10*3/uL (ref 1.7–7.7)
Neutrophils Relative %: 50 %
Platelet Count: 258 10*3/uL (ref 150–400)
RBC: 3.84 MIL/uL — ABNORMAL LOW (ref 3.87–5.11)
RDW: 14.6 % (ref 11.5–15.5)
WBC Count: 3.4 10*3/uL — ABNORMAL LOW (ref 4.0–10.5)
nRBC: 0 % (ref 0.0–0.2)

## 2022-02-09 LAB — CMP (CANCER CENTER ONLY)
ALT: 39 U/L (ref 0–44)
AST: 19 U/L (ref 15–41)
Albumin: 3.5 g/dL (ref 3.5–5.0)
Alkaline Phosphatase: 59 U/L (ref 38–126)
Anion gap: 4 — ABNORMAL LOW (ref 5–15)
BUN: 27 mg/dL — ABNORMAL HIGH (ref 6–20)
CO2: 31 mmol/L (ref 22–32)
Calcium: 8.8 mg/dL — ABNORMAL LOW (ref 8.9–10.3)
Chloride: 101 mmol/L (ref 98–111)
Creatinine: 1.57 mg/dL — ABNORMAL HIGH (ref 0.44–1.00)
GFR, Estimated: 38 mL/min — ABNORMAL LOW (ref >60)
Glucose, Bld: 175 mg/dL — ABNORMAL HIGH (ref 70–99)
Potassium: 4.7 mmol/L (ref 3.5–5.1)
Sodium: 136 mmol/L (ref 135–145)
Total Bilirubin: 0.2 mg/dL — ABNORMAL LOW (ref 0.3–1.2)
Total Protein: 6.9 g/dL (ref 6.5–8.1)

## 2022-02-09 MED ORDER — POLYETHYLENE GLYCOL 3350 17 G PO PACK
17.0000 g | PACK | Freq: Every day | ORAL | 1 refills | Status: AC
  Filled 2022-02-09: qty 30, 30d supply, fill #0

## 2022-02-09 MED ORDER — ONDANSETRON HCL 8 MG PO TABS
8.0000 mg | ORAL_TABLET | Freq: Three times a day (TID) | ORAL | 3 refills | Status: DC | PRN
  Filled 2022-02-09 – 2022-02-10 (×2): qty 30, 10d supply, fill #0

## 2022-02-09 MED ORDER — PROCHLORPERAZINE MALEATE 10 MG PO TABS
10.0000 mg | ORAL_TABLET | Freq: Four times a day (QID) | ORAL | 0 refills | Status: DC | PRN
  Filled 2022-02-09 – 2022-02-10 (×2): qty 30, 8d supply, fill #0

## 2022-02-09 MED ORDER — DEXAMETHASONE 4 MG PO TABS
ORAL_TABLET | ORAL | 6 refills | Status: DC
  Filled 2022-02-09 – 2022-02-10 (×2): qty 36, 84d supply, fill #0

## 2022-02-09 MED ORDER — LIDOCAINE-PRILOCAINE 2.5-2.5 % EX CREA
1.0000 | TOPICAL_CREAM | CUTANEOUS | 0 refills | Status: AC | PRN
Start: 2022-02-09 — End: ?
  Filled 2022-02-09 – 2022-02-10 (×2): qty 30, 30d supply, fill #0

## 2022-02-09 MED ORDER — SENNOSIDES-DOCUSATE SODIUM 8.6-50 MG PO TABS
2.0000 | ORAL_TABLET | Freq: Two times a day (BID) | ORAL | 1 refills | Status: DC
  Filled 2022-02-09: qty 90, 23d supply, fill #0

## 2022-02-09 MED ORDER — MORPHINE SULFATE 15 MG PO TABS
15.0000 mg | ORAL_TABLET | ORAL | 0 refills | Status: DC | PRN
  Filled 2022-02-09 – 2022-02-10 (×2): qty 30, 5d supply, fill #0

## 2022-02-09 NOTE — Assessment & Plan Note (Signed)
Her pain is poorly controlled I will discontinue oxycodone and switch her to morphine sulfate I warned her about risk of constipation and we discussed narcotic refill policy I will see her next week to assess pain management

## 2022-02-09 NOTE — Assessment & Plan Note (Signed)
She had history of noncompliance She is able to keep up with her appointment as scheduled right now and we discussed importance of close monitoring and follow-up

## 2022-02-09 NOTE — Progress Notes (Signed)
[place]  [phone] Fax:([phone]) [phone]   Name: Krista [name] Date: 02/09/2022 MRN: [id number]  DOB: 09-10-1960  Patient Care Team: Drue Flirt, MD as PCP - General (Family Medicine) Lorretta Harp, MD as PCP - Cardiology (Cardiology) Heath Lark, MD as Consulting Physician (Hematology and Oncology)    REASON FOR CONSULTATION: Krista [name] is a 61 y.o. female with medical history including endometrial carcinoma, coronary artery disease, prior MI, type 2 diabetes, hyperlipidemia, hypertension, chronic anxiety/depression, and former tobacco use disorder. She had a recent admission related  left-sided chest pain after falling on her left side 1 week ago associated with 1 episode of hemoptysis 3. Found to have rib fractures on left side. CT chest notes 4 cm spiculated nodule in R perihilar region that is cavitating, multiple pulm nodules, multifocal medistinal and bilateral hilar adenopathy. CT abd/pelvis with multiple abdominal and pelvic implants as well. Patient has discussed with oncology her metastatic cancer and has opted to pursue chemotherapy. She was seen by our team during hospitalization and requested for close follow-up at [facility] for ongoing support and symptom management.     SOCIAL HISTORY:     reports that she quit smoking about 4 weeks ago. Her smoking use included cigarettes. She has a 16.50 pack-year smoking history. She has never used smokeless tobacco. She reports that she does not drink alcohol and does not use drugs.  ADVANCE DIRECTIVES:  Patient reports her mother is her medical decision maker.   CODE STATUS: DNR  PAST MEDICAL HISTORY: Past Medical History:  Diagnosis Date   Arthritis    CAD (coronary artery disease)    Depression    Diabetes mellitus    Hypertension    NSTEMI (non-ST elevated myocardial infarction) ([place])     PAST SURGICAL HISTORY:  Past Surgical History:   Procedure Laterality Date   ANKLE SURGERY     left ankle - was broken   CORONARY STENT INTERVENTION N/A 12/16/2018   Procedure: CORONARY STENT INTERVENTION;  Surgeon: Leonie Man, MD;  Location: [place] [facility];  Service: Cardiovascular;  Laterality: N/A;  LAD   IR IMAGING GUIDED PORT INSERTION  01/31/2022   IR REMOVAL TUN ACCESS W/ PORT W/O FL MOD SED  01/31/2022   LEFT HEART CATH AND CORONARY ANGIOGRAPHY N/A 12/16/2018   Procedure: LEFT HEART CATH AND CORONARY ANGIOGRAPHY;  Surgeon: Leonie Man, MD;  Location: [place] [facility];  Service: Cardiovascular;  Laterality: N/A;    HEMATOLOGY/ONCOLOGY HISTORY:  Oncology History Overview Note  High grade serous MSI stable, Her2/neu 3+, 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    12/14/2020 Imaging   CT chest abdomen and pelvis IMPRESSION:  1.  2 small nodules in the right lung, nonspecific.  2.  Prominent precarinal lymph node versus adjacent small nodes.  3.  Enlarged heterogeneous uterine fundus.  4.  No definite adenopathy in the abdomen or pelvis. Nonspecific small lymph nodes are present, not enlarged by CT criteria.  5.  Mild wall thickening of the anterior aspect of the urinary bladder which may be due to to nondistention or inflammation.    12/27/2020 Pathology Results   SPECIMEN     Procedure:    Total hysterectomy and bilateral salpingo-oophorectomy   TUMOR     Histologic Type:  Serous carcinoma     Histologic Grade:    Not applicable     Myometrial Invasion:    Present       Depth of Myometrial Invasion:    11 mm       Myometrial Thickness:    12 mm       Percentage of Myometrial Invasion:    92 %     Uterine Serosa Involvement:    Not identified     Cervical Stromal Involvement:    Not identified     Other Tissue / Organ Involvement:    Not identified     Peritoneal / Ascitic  Fluid:    Not identified     Lymphovascular Invasion (LVI):    Present   MARGINS     Margin Status:    Not applicable   REGIONAL LYMPH NODES     Regional Lymph Node Status:           :    All regional lymph nodes negative for tumor cells         Lymph Nodes Examined:               Total Number of Pelvic Nodes Examined:    3           Number of Pelvic Sentinel Nodes Examined:    3           Total Number of Para-aortic Nodes Examined:    0           Number of Para-aortic Sentinel Nodes Examined:    Not applicable   DISTANT METASTASIS     Distant Site(s) Involved:    Not applicable   PATHOLOGIC STAGE CLASSIFICATION (pTNM, AJCC 8th Edition)     Reporting of pT, pN, and (when applicable) pM categories is based on information available to the pathologist at the time the report is issued. As per the AJCC (Chapter 1, 8th Ed.) it is the managing physician's responsibility to establish the final pathologic stage based upon all pertinent information, including but potentially not limited to this pathology report.     TNM Descriptors:    Not applicable     Tumor Modifier:    Not applicable     pT Category:    pT1b     Regional Lymph Nodes Modifier:    (sn)     pN Category:    pN0   The endometrial adenocarcinoma was analyzed by IHC for DNA mismatch repair proteins.  The neoplasm retained nuclear expression of all four mismatch repair proteins, MLH1, PMS2, MSH2, MSH6.    Estrogen receptors (ER): Negative Progesterone receptors (PR): Negative  Controls worked appropriately.   A HER2 stain is POSITIVE (3+).   12/27/2020 Surgery   At [facility], she underwent a robotic hysterectomy/BSO, sentinel node dissection and partial omentectomy. She was found to have an enlarged uterus. An enlarged right pelvic sentinel node.    02/23/2021 Echocardiogram   Left Ventricle: Systolic function is normal. EF: 55-60%.    Left Ventricle: Doppler parameters consistent with mild diastolic  dysfunction and low to  normal LA pressure.    Left Ventricle: There is mild concentric hypertrophy.    Aortic Valve: Mild aortic valve regurgitation.   01/27/2022 Imaging   CT chest imaging 1. No pulmonary embolus. 2. Findings consistent with intrathoracic malignancy. Multiple pulmonary nodules throughout both lungs of varying sizes, consistent with metastatic disease. Primary site of malignancy may represent a 4 cm spiculated nodule in the  right perihilar region spanning the fissure that is cavitating. Alternatively all of these nodules may be metastatic given history of cancer, type not specified.  3. Multifocal mediastinal and bilateral hilar adenopathy, consistent metastatic disease. 4. Nondisplaced left anterior seventh and eighth rib fractures, soft tissue thickening adjacent to the seventh rib fracture suggests this may be pathologic 5. Right hydronephrosis is partially included in the field of view in the upper abdomen. Small soft tissue density medial to the right hepatic lobe posterior to the right kidney is nonspecific but may represent metastatic disease. Recommend staging CT of the abdomen and pelvis with oral and IV contrast. 6. Moderate emphysema. 7. Aortic atherosclerosis.  Coronary artery calcifications.   01/28/2022 Initial Diagnosis   Endometrial cancer ([place])   01/28/2022 Cancer Staging   Staging form: Corpus Uteri - Carcinoma and Carcinosarcoma, AJCC 8th Edition - Clinical stage from 01/28/2022: FIGO Stage IVB (rcT1b, cN2a, cM1) - Signed by Heath Lark, MD on 01/28/2022 Stage prefix: Recurrence   01/28/2022 Imaging   CT abdomen and pelvis 1. Multiple abdominal and pelvic metastatic implants as above. 2. Moderate right hydronephrosis secondary to mass effect and obstruction of the distal right ureter by a right pelvic metastatic implant. 3. Multiple pulmonary metastatic disease in the visualized right lung base. 4. No bowel obstruction. Normal appendix. 5. Aortic Atherosclerosis (ICD10-I70.0).    01/30/2022 Echocardiogram    1. Left ventricular ejection fraction, by estimation, is 55 to 60%. The left ventricle has normal function. The left ventricle has no regional wall motion abnormalities. There is moderate concentric left ventricular  hypertrophy. Left ventricular diastolic parameters are consistent with Grade I diastolic dysfunction (impaired relaxation).   2. Right ventricular systolic function is normal. The right ventricular size is normal. There is normal pulmonary artery systolic pressure.   3. Left atrial size was mildly dilated.   4. The mitral valve is normal in structure. Trivial mitral valve regurgitation. No evidence of mitral stenosis.   5. The aortic valve is tricuspid. Aortic valve regurgitation is trivial. No aortic stenosis is present.   6. Aortic dilatation noted. There is mild dilatation of the aortic root, measuring 39 mm.   7. The inferior vena cava is normal in size with <50% respiratory variability, suggesting right atrial pressure of 8 mmHg.    01/31/2022 -  Chemotherapy   Patient is on Treatment Plan : UTERINE SEROUS CARCINOMA Carboplatin + Paclitaxel + Trastuzumab q21d x 6 Cycles / Trastuzumab q21d       ALLERGIES:  is allergic to statins and zetia [ezetimibe].  MEDICATIONS:  Current Outpatient Medications  Medication Sig Dispense Refill   acetaminophen (TYLENOL) 500 MG tablet Take 1,000 mg by mouth every 6 (six) hours as needed for mild pain or headache.     amLODipine (NORVASC) 5 MG tablet Take 1 tablet (5 mg total) by mouth daily. 30 tablet 0   aspirin EC 81 MG tablet Take 1 tablet (81 mg total) by mouth daily. 30 tablet 0   clopidogrel (PLAVIX) 75 MG tablet Take 1 tablet (75 mg total) by mouth daily. 30 tablet 0   dexamethasone (DECADRON) 4 MG tablet Take 2 tabs at the night before and 2 tab the morning of chemotherapy, every 3 weeks, by mouth x 6 cycles 36 tablet 6   Evolocumab (REPATHA SURECLICK) 259 MG/ML SOAJ Inject 140 mg into the skin every 14  (fourteen) days. (Patient not taking: Reported on 01/28/2022) 2 pen 11   lidocaine (LIDODERM) 5 % Place 2 patches onto the  skin daily. Remove & Discard patch within 12 hours or as directed by MD 30 patch 0   lidocaine-prilocaine (EMLA) cream Apply 1 Application topically as needed. 30 g 0   loratadine (CLARITIN) 10 MG tablet Take 1 tablet (10 mg total) by mouth daily as needed for allergies. (Patient not taking: Reported on 01/28/2022) 30 tablet 11   metFORMIN (GLUCOPHAGE) 1000 MG tablet Take 1,000 mg by mouth 2 (two) times daily with a meal. (Patient not taking: Reported on 01/28/2022)     metoprolol tartrate (LOPRESSOR) 25 MG tablet Take 1 tablet (25 mg total) by mouth 2 (two) times daily. 60 tablet 0   morphine (MSIR) 15 MG tablet Take 1 tablet (15 mg total) by mouth every 4 (four) hours as needed for severe pain. 30 tablet 0   nitroGLYCERIN (NITROSTAT) 0.4 MG SL tablet Place 1 tablet (0.4 mg total) under the tongue every 5 (five) minutes as needed for chest pain. (Patient not taking: Reported on 01/28/2022) 30 tablet 2   ondansetron (ZOFRAN) 8 MG tablet Take 1 tablet (8 mg total) by mouth every 8 (eight) hours as needed for nausea. 30 tablet 3   polyethylene glycol (MIRALAX) 17 g packet Take 17 g by mouth daily. 30 each 1   prochlorperazine (COMPAZINE) 10 MG tablet Take 1 tablet (10 mg total) by mouth every 6 (six) hours as needed for nausea or vomiting. 30 tablet 0   senna-docusate (SENOKOT-S) 8.6-50 MG tablet Take 2 tablets by mouth 2 (two) times daily. 90 tablet 1   No current facility-administered medications for this visit.    VITAL SIGNS: BP (!) 145/89 (BP Location: Right Arm, Patient Position: Sitting)   Pulse 81   Temp 98 F (36.7 C) (Oral)   Resp 16   Ht _0  (1.6 m)   Wt 155 lb 14.4 oz (70.7 kg)   SpO2 99%   BMI 27.62 kg/m  Filed Weights   02/09/22 1312  Weight: 155 lb 14.4 oz (70.7 kg)    Estimated body mass index is 27.62 kg/m as calculated from the following:   Height  as of this encounter: _1  (1.6 m).   Weight as of this encounter: 155 lb 14.4 oz (70.7 kg).  LABS: CBC:    Component Value Date/Time   WBC 3.4 (L) 02/09/2022 1255   WBC 5.4 02/01/2022 0508   HGB 10.5 (L) 02/09/2022 1255   HCT 31.8 (L) 02/09/2022 1255   PLT 258 02/09/2022 1255   MCV 82.8 02/09/2022 1255   NEUTROABS 1.7 02/09/2022 1255   LYMPHSABS 1.4 02/09/2022 1255   MONOABS 0.2 02/09/2022 1255   EOSABS 0.1 02/09/2022 1255   BASOSABS 0.0 02/09/2022 1255   Comprehensive Metabolic Panel:    Component Value Date/Time   NA 136 02/09/2022 1255   NA 141 02/25/2019 1222   K 4.7 02/09/2022 1255   CL 101 02/09/2022 1255   CO2 31 02/09/2022 1255   BUN 27 (H) 02/09/2022 1255   BUN 17 02/25/2019 1222   CREATININE 1.57 (H) 02/09/2022 1255   GLUCOSE 175 (H) 02/09/2022 1255   CALCIUM 8.8 (L) 02/09/2022 1255   AST 19 02/09/2022 1255   ALT 39 02/09/2022 1255   ALKPHOS 59 02/09/2022 1255   BILITOT 0.2 (L) 02/09/2022 1255   PROT 6.9 02/09/2022 1255   PROT 6.9 09/22/2019 1419   ALBUMIN 3.5 02/09/2022 1255   ALBUMIN 3.9 09/22/2019 1419    RADIOGRAPHIC STUDIES: IR IMAGING GUIDED PORT INSERTION  Result Date: 01/31/2022 CLINICAL DATA:  Short malfunctioning right subclavian power port catheter EXAM: ULTRASOUND GUIDANCE FOR VASCULAR ACCESS RIGHT IJ POWER PORT CATHETER INSERTION (UTILIZING THE EXISTING RIGHT CHEST PORT POCKET) Date:  01/31/2022 01/31/2022 9:48 am Radiologist:  Jerilynn Mages. Daryll Brod, MD Guidance:  ULTRASOUND AND FLUOROSCOPIC MEDICATIONS: 1% lidocaine local with epinephrine. ANESTHESIA/SEDATION: Versed 3.0 mg IV; Fentanyl 125 mcg IV; Moderate Sedation Time:  42 minute The patient was continuously monitored during the procedure by the interventional radiology nurse under my direct supervision. FLUOROSCOPY: Fluoroscopy Time:(2.0 mGy). CONTRAST:  None. COMPLICATIONS: None immediate. PROCEDURE: Informed consent was obtained from the patient following explanation of the procedure, risks,  benefits and alternatives. The patient understands, agrees and consents for the procedure. All questions were addressed. A time out was performed. Under sterile conditions and local anesthesia, right internal jugular micropuncture venous access was performed with ultrasound. Images were obtained for documentation. A guide wire was inserted followed by a 4-French dilator. A catheter and guidewire were utilized to access the IVC. The existing right subclavian power port catheter was removed. This was done under sterile conditions and local anesthesia. An incision was made along the existing port catheter scar. Port catheter was removed with sharp and blunt dissection from the subcutaneous pocket. Pocket was flushed with saline and suspected. No signs of infection. Adequate hemostasis. Subcutaneous pocket can be utilized for revision. Measurements were obtained from the SVC/RA junction back to the venotomy site. The port catheter was assembled and checked for leakage. The port catheter was secured in the existing pocket with two retention Ethilon sutures. The tubing was tunneled subcutaneously to the new right IJ venotomy site and inserted into the SVC/RA junction through a valved peel-away sheath. Position was confirmed with fluoroscopy. No immediate complications. The patient tolerated the procedure well. Incisions were closed with Vicryl suture and Dermabond. The port catheter was accessed, blood was aspirated followed by saline and heparin flushes. Needle was removed. A sterile dressing was applied. IMPRESSION: Successful ultrasound and fluoroscopic right IJ power port catheter insertion utilizing the existing right subclavian port catheter pocket. Malpositioned right subclavian port catheter was removed. Electronically Signed   By: Jerilynn Mages.  Shick M.D.   On: 01/31/2022 13:45   IR REMOVAL TUN ACCESS W/ PORT W/O FL MOD SED  Result Date: 01/31/2022 CLINICAL DATA:  Short malfunctioning right subclavian power port  catheter EXAM: ULTRASOUND GUIDANCE FOR VASCULAR ACCESS RIGHT IJ POWER PORT CATHETER INSERTION (UTILIZING THE EXISTING RIGHT CHEST PORT POCKET) Date:  01/31/2022 01/31/2022 9:48 am Radiologist:  Jerilynn Mages. Daryll Brod, MD Guidance:  ULTRASOUND AND FLUOROSCOPIC MEDICATIONS: 1% lidocaine local with epinephrine. ANESTHESIA/SEDATION: Versed 3.0 mg IV; Fentanyl 125 mcg IV; Moderate Sedation Time:  42 minute The patient was continuously monitored during the procedure by the interventional radiology nurse under my direct supervision. FLUOROSCOPY: Fluoroscopy Time:(2.0 mGy). CONTRAST:  None. COMPLICATIONS: None immediate. PROCEDURE: Informed consent was obtained from the patient following explanation of the procedure, risks, benefits and alternatives. The patient understands, agrees and consents for the procedure. All questions were addressed. A time out was performed. Under sterile conditions and local anesthesia, right internal jugular micropuncture venous access was performed with ultrasound. Images were obtained for documentation. A guide wire was inserted followed by a 4-French dilator. A catheter and guidewire were utilized to access the IVC. The existing right subclavian power port catheter was removed. This was done under sterile conditions and local anesthesia. An incision was made along the existing port catheter scar. Port catheter was removed with sharp and blunt dissection from the subcutaneous pocket. Pocket  was flushed with saline and suspected. No signs of infection. Adequate hemostasis. Subcutaneous pocket can be utilized for revision. Measurements were obtained from the SVC/RA junction back to the venotomy site. The port catheter was assembled and checked for leakage. The port catheter was secured in the existing pocket with two retention Ethilon sutures. The tubing was tunneled subcutaneously to the new right IJ venotomy site and inserted into the SVC/RA junction through a valved peel-away sheath. Position was  confirmed with fluoroscopy. No immediate complications. The patient tolerated the procedure well. Incisions were closed with Vicryl suture and Dermabond. The port catheter was accessed, blood was aspirated followed by saline and heparin flushes. Needle was removed. A sterile dressing was applied. IMPRESSION: Successful ultrasound and fluoroscopic right IJ power port catheter insertion utilizing the existing right subclavian port catheter pocket. Malpositioned right subclavian port catheter was removed. Electronically Signed   By: Jerilynn Mages.  Shick M.D.   On: 01/31/2022 13:45   ECHOCARDIOGRAM COMPLETE  Result Date: 01/29/2022    ECHOCARDIOGRAM REPORT   Patient Name:   Krista [name] [facility]ician [facility] Date of Exam: 01/29/2022 Medical Rec #:  [id number]        Height:       63.0 in Accession #:    [id number]       Weight:       150.0 lb Date of Birth:  1961/05/02       BSA:          73.711 m Patient Age:    50 years         BP:           132/89 mmHg Patient Gender: F                HR:           70 bpm. Exam Location:  Inpatient Procedure: 2D Echo, Cardiac Doppler and Color Doppler Indications:    Elevated Troponin  History:        Patient has prior history of Echocardiogram examinations, most                 recent 05/13/2019. Previous Myocardial Infarction and CAD; Risk                 Factors:Hypertension and Diabetes.  Sonographer:    Bernadene Person RDCS Referring Phys: [id number] [place]  1. Left ventricular ejection fraction, by estimation, is 55 to 60%. The left ventricle has normal function. The left ventricle has no regional wall motion abnormalities. There is moderate concentric left ventricular hypertrophy. Left ventricular diastolic parameters are consistent with Grade I diastolic dysfunction (impaired relaxation).  2. Right ventricular systolic function is normal. The right ventricular size is normal. There is normal pulmonary artery systolic pressure.  3. Left atrial size was mildly dilated.  4. The mitral valve  is normal in structure. Trivial mitral valve regurgitation. No evidence of mitral stenosis.  5. The aortic valve is tricuspid. Aortic valve regurgitation is trivial. No aortic stenosis is present.  6. Aortic dilatation noted. There is mild dilatation of the aortic root, measuring 39 mm.  7. The inferior vena cava is normal in size with <50% respiratory variability, suggesting right atrial pressure of 8 mmHg. FINDINGS  Left Ventricle: Left ventricular ejection fraction, by estimation, is 55 to 60%. The left ventricle has normal function. The left ventricle has no regional wall motion abnormalities. The left ventricular internal cavity size was normal in size. There is  moderate concentric left ventricular  hypertrophy. Left ventricular diastolic parameters are consistent with Grade I diastolic dysfunction (impaired relaxation). Indeterminate filling pressures. Right Ventricle: The right ventricular size is normal. No increase in right ventricular wall thickness. Right ventricular systolic function is normal. There is normal pulmonary artery systolic pressure. The tricuspid regurgitant velocity is 1.46 m/s, and  with an assumed right atrial pressure of 8 mmHg, the estimated right ventricular systolic pressure is 06.2 mmHg. Left Atrium: Left atrial size was mildly dilated. Right Atrium: Right atrial size was normal in size. Pericardium: There is no evidence of pericardial effusion. Mitral Valve: The mitral valve is normal in structure. Trivial mitral valve regurgitation. No evidence of mitral valve stenosis. Tricuspid Valve: The tricuspid valve is normal in structure. Tricuspid valve regurgitation is trivial. No evidence of tricuspid stenosis. Aortic Valve: The aortic valve is tricuspid. Aortic valve regurgitation is trivial. No aortic stenosis is present. Pulmonic Valve: The pulmonic valve was normal in structure. Pulmonic valve regurgitation is not visualized. No evidence of pulmonic stenosis. Aorta: Aortic dilatation  noted. There is mild dilatation of the aortic root, measuring 39 mm. Venous: The inferior vena cava is normal in size with less than 50% respiratory variability, suggesting right atrial pressure of 8 mmHg. IAS/Shunts: No atrial level shunt detected by color flow Doppler.  LEFT VENTRICLE PLAX 2D LVIDd:         4.00 cm     Diastology LVIDs:         2.50 cm     LV e' medial:    4.05 cm/s LV PW:         1.30 cm     LV E/e' medial:  10.4 LV IVS:        1.30 cm     LV e' lateral:   5.45 cm/s LVOT diam:     2.20 cm     LV E/e' lateral: 7.7 LV SV:         85 LV SV Index:   50 LVOT Area:     3.80 cm  LV Volumes (MOD) LV vol d, MOD A2C: 81.8 ml LV vol d, MOD A4C: 90.6 ml LV vol s, MOD A2C: 32.0 ml LV vol s, MOD A4C: 39.3 ml LV SV MOD A2C:     49.8 ml LV SV MOD A4C:     90.6 ml LV SV MOD BP:      52.9 ml RIGHT VENTRICLE RV S prime:     14.30 cm/s TAPSE (M-mode): 2.1 cm LEFT ATRIUM             Index        RIGHT ATRIUM           Index LA diam:        3.70 cm 2.16 cm/m   RA Area:     15.60 cm LA Vol (A2C):   55.3 ml 32.32 ml/m  RA Volume:   38.90 ml  22.73 ml/m LA Vol (A4C):   47.9 ml 27.99 ml/m LA Biplane Vol: 53.4 ml 31.21 ml/m  AORTIC VALVE LVOT Vmax:   114.00 cm/s LVOT Vmean:  73.300 cm/s LVOT VTI:    0.223 m  AORTA Ao Root diam: 3.90 cm MITRAL VALVE               TRICUSPID VALVE MV Area (PHT): 2.00 cm    TR Peak grad:   8.5 mmHg MV Decel Time: 380 msec    TR Vmax:        146.00 cm/s MV  E velocity: 42.20 cm/s MV A velocity: 79.50 cm/s  SHUNTS MV E/A ratio:  0.53        Systemic VTI:  0.22 m                            Systemic Diam: 2.20 cm Skeet Latch MD Electronically signed by Skeet Latch MD Signature Date/Time: 01/29/2022/10:57:38 AM    Final    DG Chest 2 View  Addendum Date: 01/28/2022   ADDENDUM REPORT: 01/28/2022 15:05 ADDENDUM: Tip of right chest port is seen in the region of right innominate vein. Electronically Signed   By: Elmer Picker M.D.   On: 01/28/2022 15:05   Result Date:  01/28/2022 CLINICAL DATA:  Shortness of breath, cough EXAM: CHEST - 2 VIEW COMPARISON:  Previous studies including the examination of 05/14/2019 FINDINGS: Cardiac size is within normal limits. Thoracic aorta is tortuous. Right hilum appears more prominent. There is 2.8 cm smooth marginated nodule in the left upper lung fields adjacent to the left hilum.Rest of the lung fields are essentially clear. There is no pleural effusion or pneumothorax. IMPRESSION: There is 2.8 cm nodular density in the left parahilar region. Possibility of malignant neoplasm is not excluded. Follow-up CT chest may be considered. Right hilum appears more prominent. This may be due to prominent central pulmonary vessels or lymphadenopathy. Electronically Signed: By: Elmer Picker M.D. On: 01/27/2022 17:45   CT ABDOMEN PELVIS W CONTRAST  Result Date: 01/28/2022 CLINICAL DATA:  Evaluate for metastatic disease. EXAM: CT ABDOMEN AND PELVIS WITH CONTRAST TECHNIQUE: Multidetector CT imaging of the abdomen and pelvis was performed using the standard protocol following bolus administration of intravenous contrast. RADIATION DOSE REDUCTION: This exam was performed according to the departmental dose-optimization program which includes automated exposure control, adjustment of the mA and/or kV according to patient size and/or use of iterative reconstruction technique. CONTRAST:  15m OMNIPAQUE IOHEXOL 300 MG/ML  SOLN COMPARISON:  CT abdomen pelvis dated 09/22/2020. chest CT dated 01/27/2022. FINDINGS: Lower chest: Multiple pulmonary nodules in the visualized right lung base consistent with metastatic disease. Partially visualized right perihilar nodular densities (1/3) corresponding to the right hilar mass or lymphadenopathy. These findings are better evaluated on the chest CT of 01/27/2022. No intra-abdominal free air.  Small free fluid in the pelvis. Hepatobiliary: Faint small nodular deposit along the liver capsule in the right lobe of the  liver (12/3) measuring up to 12 mm most consistent with metastatic implant. No intrahepatic biliary ductal dilatation. The gallbladder is unremarkable. Pancreas: The pancreas is unremarkable. No active inflammatory changes. No dilatation of the main pancreatic duct. Spleen: The spleen is unremarkable. Adrenals/Urinary Tract: The adrenal glands are unremarkable. There is moderate right hydronephrosis and mild right hydroureter secondary to compression and obstruction of the distal right ureter by a metastatic lesion in the right hemipelvis. The left kidney is unremarkable. The urinary bladder is minimally distended and grossly unremarkable. Stomach/Bowel: There is no bowel obstruction or active inflammation. The appendix is normal. Vascular/Lymphatic: Moderate aortoiliac atherosclerotic disease. The IVC is unremarkable. No portal venous gas. No retroperitoneal adenopathy. Reproductive: Hysterectomy. Other: Several pelvic masses consistent with metastatic disease. The masses demonstrate central low attenuation, likely necrotic tissue. The largest measuring 4.4 x 3.5 x 4.7 cm in the right hemipelvis. This mass causes compression of the distal right ureter with associated right-sided hydronephrosis. Additional metastatic implants in the omentum and in the left upper abdomen measure up to 4.3 x 3.3 cm  in axial dimensions (42/3). There is a 4.5 x 2.3 cm metastatic implant in the right lateral abdominal wall musculature (45/3). Faint 1.4 x 1.0 cm nodular density over the left diaphragmatic crus (22/3) suspicious for metastatic implant. Musculoskeletal: There is osteopenia with degenerative changes of the spine. No acute osseous pathology. No suspicious bone lesions. IMPRESSION: 1. Multiple abdominal and pelvic metastatic implants as above. 2. Moderate right hydronephrosis secondary to mass effect and obstruction of the distal right ureter by a right pelvic metastatic implant. 3. Multiple pulmonary metastatic disease in the  visualized right lung base. 4. No bowel obstruction. Normal appendix. 5. Aortic Atherosclerosis (ICD10-I70.0). Electronically Signed   By: Anner Crete M.D.   On: 01/28/2022 03:41   CT Angio Chest PE W and/or Wo Contrast  Result Date: 01/27/2022 CLINICAL DATA:  Pulmonary embolism (PE) suspected, high prob History of cancer, pleuritic left-sided chest pain, short of breath. Abnormality on chest x-ray CT recommended. Rule out PE versus cancer versus pneumonia versus soft tissue injury EXAM: CT ANGIOGRAPHY CHEST WITH CONTRAST TECHNIQUE: Multidetector CT imaging of the chest was performed using the standard protocol during bolus administration of intravenous contrast. Multiplanar CT image reconstructions and MIPs were obtained to evaluate the vascular anatomy. RADIATION DOSE REDUCTION: This exam was performed according to the departmental dose-optimization program which includes automated exposure control, adjustment of the mA and/or kV according to patient size and/or use of iterative reconstruction technique. CONTRAST:  30m OMNIPAQUE IOHEXOL 350 MG/ML SOLN COMPARISON:  Chest radiograph earlier today. No prior chest CT FINDINGS: Cardiovascular: There are no filling defects within the pulmonary arteries to suggest pulmonary embolus. Mild aortic atherosclerosis. The heart is normal in size. There are coronary artery calcifications. No pericardial effusion. Mediastinum/Nodes: There is multifocal mediastinal adenopathy. Anterior paratracheal node versus nodal conglomerate measuring 2 cm short axis series 5, image 45. Subcarinal node versus nodal conglomerate measuring 2.8 cm series 5, image 56. There are multiple enlarged right hilar nodes measuring from 14 to 19 mm. There are enlarged left hilar nodes measuring up to 15 mm. There is a 14 mm right epicardial node. The esophagus is decompressed. Lungs/Pleura: Moderate emphysema and bronchial thickening. The right lobar bronchi are attenuated due to adjacent  adenopathy. There are multiple pulmonary nodules throughout both lungs of varying sizes. Right perihilar spiculated nodule spanning the fissure measures 4 x 2.3 x 3.6 cm and is cavitating series 6, image 47, series 8, image 77. 2.5 cm left upper lobe nodule corresponds to abnormality on CT. Majority of the additional nodules range from 5-15 mm. Fissural thickening in the left lung. No features of pulmonary edema. There is no endobronchial lesion. No pleural effusion. Upper Abdomen: Limited assessment for focal hepatic lesion given phase of contrast. There is a small soft tissue density medial to the right hepatic lobe posterior to the right kidney series 5, image 127. Right hydronephrosis is partially included in the field of view. Lack of upper abdominal bowel fat limits detailed assessment. Musculoskeletal: Nondisplaced left anterior seventh and eighth rib fractures. There is some soft tissue thickening adjacent to the seventh rib fracture suggesting this may be pathologic. No chest wall soft tissue abnormalities. Review of the MIP images confirms the above findings. IMPRESSION: 1. No pulmonary embolus. 2. Findings consistent with intrathoracic malignancy. Multiple pulmonary nodules throughout both lungs of varying sizes, consistent with metastatic disease. Primary site of malignancy may represent a 4 cm spiculated nodule in the right perihilar region spanning the fissure that is cavitating. Alternatively all of these  nodules may be metastatic given history of cancer, type not specified. 3. Multifocal mediastinal and bilateral hilar adenopathy, consistent metastatic disease. 4. Nondisplaced left anterior seventh and eighth rib fractures, soft tissue thickening adjacent to the seventh rib fracture suggests this may be pathologic 5. Right hydronephrosis is partially included in the field of view in the upper abdomen. Small soft tissue density medial to the right hepatic lobe posterior to the right kidney is  nonspecific but may represent metastatic disease. Recommend staging CT of the abdomen and pelvis with oral and IV contrast. 6. Moderate emphysema. 7. Aortic atherosclerosis.  Coronary artery calcifications. Aortic Atherosclerosis (ICD10-I70.0) and Emphysema (ICD10-J43.9). Electronically Signed   By: Keith Rake M.D.   On: 01/27/2022 23:03    PERFORMANCE STATUS (ECOG) : 1 - Symptomatic but completely ambulatory  Review of Systems  Constitutional:  Positive for fatigue.  Respiratory:  Positive for cough.   Musculoskeletal:  Positive for arthralgias.  Unless otherwise noted, a complete review of systems is negative.  Physical Exam General: NAD Cardiovascular: regular rate and rhythm Pulmonary: diminished, cough  Abdomen: soft, nontender, + bowel sounds Extremities: no edema, no joint deformities Neurological: AAO x3, mood appropriate   IMPRESSION: Krista [name] presents to the clinic today with her mother for symptom management follow-up and ongoing support. She is ambulatory without assistive devices.   I introduced myself, Krista [name], and Palliative's role in collaboration with the oncology team. Concept of Palliative Care was introduced as specialized medical care for people and their families living with serious illness.  It focuses on providing relief from the symptoms and stress of a serious illness.  The goal is to improve quality of life for both the patient and the family. Values and goals of care important to patient and family were attempted to be elicited.   Krista [name] shares she has been doing well since recent hospitalization. She does continue to have cough which she has noticed some blood at times.   Neoplasm related pain/fractured ribs  Continues to have ongoing left sided rib and abdominal pain. She has been taking Oxy IR 10 mg every 5-12 hours however pain returns around 4-5 hours. We discussed goals for better pain control. We will plan to continue with short-term  medications with increased frequency of every 4 hours. If pain remains uncontrolled can consider long acting support.   Constipation  Reports constipation. No bowel movement in 3 days or more. She is not currently taking any stool softners. Her mother confirms that they do have Miralax in the home. Education provided to begin taking twice daily for bowel support in the setting of opioid use.   Goals of Care   We discussed her current illness and what it means in the larger context of Her on-going co-morbidities. Natural disease trajectory and expectations were discussed.  DNR/DNI. Patient is realistic in her understanding however remaining hopeful for stability. She is clear in expressed wishes to continue to treat the treatable allowing her every opportunity to thrive.   I discussed the importance of continued conversation with family and their medical providers regarding overall plan of care and treatment options, ensuring decisions are within the context of the patients values and GOCs.  PLAN: Established therapeutic relationship. Education provided on palliative's role in collaboration with her oncology medical team.  MS IR 15 mg every 4 hours as needed for pain. Will closely monitor. Consider long acting if pain remains uncontrolled.  Miralax twice daily for bowel regimen Ongoing goals of care and  support.  I will plan to see patient back in 2-4 weeks in collaboration to other oncology appointments.    Patient expressed understanding and was in agreement with this plan. She also understands that She can call the clinic at any time with any questions, concerns, or complaints.   Thank you for your referral and allowing Palliative to assist in Krista [name]'s care.   Number and complexity of problems addressed: 3 HIGH - 1 or more chronic illnesses with SEVERE exacerbation, progression, or side effects of treatment - advanced cancer, pain. Any controlled substances utilized were  prescribed in the context of palliative care.  Time Total: 45 min   Visit consisted of counseling and education dealing with the complex and emotionally intense issues of symptom management and palliative care in the setting of serious and potentially life-threatening illness.Greater than 50%  of this time was spent counseling and coordinating care related to the above assessment and plan.  Signed by: Alda Lea, AGPCNP-BC Palliative Medicine Team/[facility]

## 2022-02-09 NOTE — Progress Notes (Signed)
[place] OFFICE PROGRESS NOTE  Patient Care Team: Drue Flirt, MD as PCP - General (Family Medicine) Lorretta Harp, MD as PCP - Cardiology (Cardiology) Heath Lark, MD as Consulting Physician (Hematology and Oncology)  ASSESSMENT & PLAN:  Endometrial cancer [facility]) She has poorly controlled pain She has intermittent acute on chronic renal failure We discussed importance of lifestyle changes I plan to increase the dose of her pain medicine and switch over to morphine I will see her next week for further follow-up I recommend minimum 3 cycles of treatment before repeat imaging study  Cancer related pain Her pain is poorly controlled I will discontinue oxycodone and switch her to morphine sulfate I warned her about risk of constipation and we discussed narcotic refill policy I will see her next week to assess pain management  CKD (chronic kidney disease), stage III ([place]) She has intermittent acute on chronic renal failure We discussed importance of hydration and risk factor modification  Medical non-compliance She had history of noncompliance She is able to keep up with her appointment as scheduled right now and we discussed importance of close monitoring and follow-up  Pancytopenia, acquired ([place]) This is due to recent treatment She is not symptomatic Observe only She does not need transfusion support today  Other constipation She has severe constipation I will prescribe aggressive laxative therapy  No orders of the defined types were placed in this encounter.   All questions were answered. The patient knows to call the clinic with any problems, questions or concerns. The total time spent in the appointment was 40 minutes encounter with patients including review of chart and various tests results, discussions about plan of care and coordination of care plan   Heath Lark, MD 02/09/2022 2:22 PM  INTERVAL HISTORY: Please see below for problem  oriented charting. she returns for treatment follow-up with her mother Since discharge from the hospital, she has been constipated Her pain is not well controlled She denies neuropathy that is worse since treatment No recent chest pain or shortness of breath No recent bleeding  REVIEW OF SYSTEMS:   Constitutional: Denies fevers, chills or abnormal weight loss Eyes: Denies blurriness of vision Ears, nose, mouth, throat, and face: Denies mucositis or sore throat Respiratory: Denies cough, dyspnea or wheezes Cardiovascular: Denies palpitation, chest discomfort or lower extremity swelling Skin: Denies abnormal skin rashes Lymphatics: Denies new lymphadenopathy or easy bruising Neurological:Denies numbness, tingling or new weaknesses Behavioral/Psych: Mood is stable, no new changes  All other systems were reviewed with the patient and are negative.  I have reviewed the past medical history, past surgical history, social history and family history with the patient and they are unchanged from previous note.  ALLERGIES:  is allergic to statins and zetia [ezetimibe].  MEDICATIONS:  Current Outpatient Medications  Medication Sig Dispense Refill   dexamethasone (DECADRON) 4 MG tablet Take 2 tabs at the night before and 2 tab the morning of chemotherapy, every 3 weeks, by mouth x 6 cycles 36 tablet 6   lidocaine-prilocaine (EMLA) cream Apply 1 Application topically as needed. 30 g 0   morphine (MSIR) 15 MG tablet Take 1 tablet (15 mg total) by mouth every 4 (four) hours as needed for severe pain. 30 tablet 0   ondansetron (ZOFRAN) 8 MG tablet Take 1 tablet (8 mg total) by mouth every 8 (eight) hours as needed for nausea. 30 tablet 3   polyethylene glycol (MIRALAX) 17 g packet Take 17 g by mouth daily. [place]  each 1   prochlorperazine (COMPAZINE) 10 MG tablet Take 1 tablet (10 mg total) by mouth every 6 (six) hours as needed for nausea or vomiting. 30 tablet 0   senna-docusate (SENOKOT-S) 8.6-50 MG  tablet Take 2 tablets by mouth 2 (two) times daily. 90 tablet 1   acetaminophen (TYLENOL) 500 MG tablet Take 1,000 mg by mouth every 6 (six) hours as needed for mild pain or headache.     amLODipine (NORVASC) 5 MG tablet Take 1 tablet (5 mg total) by mouth daily. 30 tablet 0   aspirin EC 81 MG tablet Take 1 tablet (81 mg total) by mouth daily. 30 tablet 0   clopidogrel (PLAVIX) 75 MG tablet Take 1 tablet (75 mg total) by mouth daily. 30 tablet 0   Evolocumab (REPATHA SURECLICK) 010 MG/ML SOAJ Inject 140 mg into the skin every 14 (fourteen) days. (Patient not taking: Reported on 01/28/2022) 2 pen 11   lidocaine (LIDODERM) 5 % Place 2 patches onto the skin daily. Remove & Discard patch within 12 hours or as directed by MD 30 patch 0   loratadine (CLARITIN) 10 MG tablet Take 1 tablet (10 mg total) by mouth daily as needed for allergies. (Patient not taking: Reported on 01/28/2022) 30 tablet 11   metFORMIN (GLUCOPHAGE) 1000 MG tablet Take 1,000 mg by mouth 2 (two) times daily with a meal. (Patient not taking: Reported on 01/28/2022)     metoprolol tartrate (LOPRESSOR) 25 MG tablet Take 1 tablet (25 mg total) by mouth 2 (two) times daily. 60 tablet 0   nitroGLYCERIN (NITROSTAT) 0.4 MG SL tablet Place 1 tablet (0.4 mg total) under the tongue every 5 (five) minutes as needed for chest pain. (Patient not taking: Reported on 01/28/2022) 30 tablet 2   No current facility-administered medications for this visit.    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    12/14/2020 Imaging   CT chest abdomen and pelvis IMPRESSION:  1.  2 small nodules in the right lung, nonspecific.  2.  Prominent precarinal lymph node versus adjacent small nodes.  3.  Enlarged heterogeneous uterine  fundus.  4.  No definite adenopathy in the abdomen or pelvis. Nonspecific small lymph nodes are present, not enlarged by CT criteria.  5.  Mild wall thickening of the anterior aspect of the urinary bladder which may be due to to nondistention or inflammation.    12/27/2020 Pathology Results   SPECIMEN     Procedure:    Total hysterectomy and bilateral salpingo-oophorectomy   TUMOR     Histologic Type:    Serous carcinoma     Histologic Grade:    Not applicable     Myometrial Invasion:    Present       Depth of Myometrial Invasion:    11 mm       Myometrial Thickness:    12 mm       Percentage of Myometrial Invasion:    92 %     Uterine Serosa Involvement:    Not identified     Cervical Stromal Involvement:    Not identified     Other Tissue / Organ Involvement:    Not identified     Peritoneal / Ascitic Fluid:    Not identified  Lymphovascular Invasion (LVI):    Present   MARGINS     Margin Status:    Not applicable   REGIONAL LYMPH NODES     Regional Lymph Node Status:           :    All regional lymph nodes negative for tumor cells         Lymph Nodes Examined:               Total Number of Pelvic Nodes Examined:    3           Number of Pelvic Sentinel Nodes Examined:    3           Total Number of Para-aortic Nodes Examined:    0           Number of Para-aortic Sentinel Nodes Examined:    Not applicable   DISTANT METASTASIS     Distant Site(s) Involved:    Not applicable   PATHOLOGIC STAGE CLASSIFICATION (pTNM, AJCC 8th Edition)     Reporting of pT, pN, and (when applicable) pM categories is based on information available to the pathologist at the time the report is issued. As per the AJCC (Chapter 1, 8th Ed.) it is the managing physician's responsibility to establish the final pathologic stage based upon all pertinent information, including but potentially not limited to this pathology report.     TNM Descriptors:    Not applicable     Tumor Modifier:    Not applicable      pT Category:    pT1b     Regional Lymph Nodes Modifier:    (sn)     pN Category:    pN0   The endometrial adenocarcinoma was analyzed by IHC for DNA mismatch repair proteins.  The neoplasm retained nuclear expression of all four mismatch repair proteins, MLH1, PMS2, MSH2, MSH6.    Estrogen receptors (ER): Negative Progesterone receptors (PR): Negative  Controls worked appropriately.   A HER2 stain is POSITIVE (3+).   12/27/2020 Surgery   At [facility], she underwent a robotic hysterectomy/BSO, sentinel node dissection and partial omentectomy. She was found to have an enlarged uterus. An enlarged right pelvic sentinel node.    02/23/2021 Echocardiogram   Left Ventricle: Systolic function is normal. EF: 55-60%.    Left Ventricle: Doppler parameters consistent with mild diastolic  dysfunction and low to normal LA pressure.    Left Ventricle: There is mild concentric hypertrophy.    Aortic Valve: Mild aortic valve regurgitation.   01/27/2022 Imaging   CT chest imaging 1. No pulmonary embolus. 2. Findings consistent with intrathoracic malignancy. Multiple pulmonary nodules throughout both lungs of varying sizes, consistent with metastatic disease. Primary site of malignancy may represent a 4 cm spiculated nodule in the right perihilar region spanning the fissure that is cavitating. Alternatively all of these nodules may be metastatic given history of cancer, type not specified.  3. Multifocal mediastinal and bilateral hilar adenopathy, consistent metastatic disease. 4. Nondisplaced left anterior seventh and eighth rib fractures, soft tissue thickening adjacent to the seventh rib fracture suggests this may be pathologic 5. Right hydronephrosis is partially included in the field of view in the upper abdomen. Small soft tissue density medial to the right hepatic lobe posterior to the right kidney is nonspecific but may represent metastatic disease. Recommend staging CT of the abdomen and pelvis  with oral and IV contrast. 6. Moderate emphysema. 7. Aortic atherosclerosis.  Coronary artery calcifications.   01/28/2022  Initial Diagnosis   Endometrial cancer ([place])   01/28/2022 Cancer Staging   Staging form: Corpus Uteri - Carcinoma and Carcinosarcoma, AJCC 8th Edition - Clinical stage from 01/28/2022: FIGO Stage IVB (rcT1b, cN2a, cM1) - Signed by Heath Lark, MD on 01/28/2022 Stage prefix: Recurrence   01/28/2022 Imaging   CT abdomen and pelvis 1. Multiple abdominal and pelvic metastatic implants as above. 2. Moderate right hydronephrosis secondary to mass effect and obstruction of the distal right ureter by a right pelvic metastatic implant. 3. Multiple pulmonary metastatic disease in the visualized right lung base. 4. No bowel obstruction. Normal appendix. 5. Aortic Atherosclerosis (ICD10-I70.0).   01/30/2022 Echocardiogram    1. Left ventricular ejection fraction, by estimation, is 55 to 60%. The left ventricle has normal function. The left ventricle has no regional wall motion abnormalities. There is moderate concentric left ventricular  hypertrophy. Left ventricular diastolic parameters are consistent with Grade I diastolic dysfunction (impaired relaxation).   2. Right ventricular systolic function is normal. The right ventricular size is normal. There is normal pulmonary artery systolic pressure.   3. Left atrial size was mildly dilated.   4. The mitral valve is normal in structure. Trivial mitral valve regurgitation. No evidence of mitral stenosis.   5. The aortic valve is tricuspid. Aortic valve regurgitation is trivial. No aortic stenosis is present.   6. Aortic dilatation noted. There is mild dilatation of the aortic root, measuring 39 mm.   7. The inferior vena cava is normal in size with <50% respiratory variability, suggesting right atrial pressure of 8 mmHg.    01/31/2022 -  Chemotherapy   Patient is on Treatment Plan : UTERINE SEROUS CARCINOMA Carboplatin + Paclitaxel +  Trastuzumab q21d x 6 Cycles / Trastuzumab q21d       PHYSICAL EXAMINATION: ECOG PERFORMANCE STATUS: 1 - Symptomatic but completely ambulatory  Vitals:   02/09/22 1353  BP: (!) 154/80  Pulse: 82  Resp: 18  Temp: 98.2 F (36.8 C)  SpO2: 98%   Filed Weights   02/09/22 1353  Weight: 156 lb 3.2 oz (70.9 kg)    GENERAL:alert, no distress and comfortable NEURO: alert & oriented x 3 with fluent speech, no focal motor/sensory deficits  LABORATORY DATA:  I have reviewed the data as listed    Component Value Date/Time   NA 136 02/09/2022 1255   NA 141 02/25/2019 1222   K 4.7 02/09/2022 1255   CL 101 02/09/2022 1255   CO2 31 02/09/2022 1255   GLUCOSE 175 (H) 02/09/2022 1255   BUN 27 (H) 02/09/2022 1255   BUN 17 02/25/2019 1222   CREATININE 1.57 (H) 02/09/2022 1255   CALCIUM 8.8 (L) 02/09/2022 1255   PROT 6.9 02/09/2022 1255   PROT 6.9 09/22/2019 1419   ALBUMIN 3.5 02/09/2022 1255   ALBUMIN 3.9 09/22/2019 1419   AST 19 02/09/2022 1255   ALT 39 02/09/2022 1255   ALKPHOS 59 02/09/2022 1255   BILITOT 0.2 (L) 02/09/2022 1255   GFRNONAA 38 (L) 02/09/2022 1255   GFRAA >60 05/16/2019 0335    No results found for: "SPEP", "UPEP"  Lab Results  Component Value Date   WBC 3.4 (L) 02/09/2022   NEUTROABS 1.7 02/09/2022   HGB 10.5 (L) 02/09/2022   HCT 31.8 (L) 02/09/2022   MCV 82.8 02/09/2022   PLT 258 02/09/2022      Chemistry      Component Value Date/Time   NA 136 02/09/2022 1255   NA 141 02/25/2019 1222   K  4.7 02/09/2022 1255   CL 101 02/09/2022 1255   CO2 31 02/09/2022 1255   BUN 27 (H) 02/09/2022 1255   BUN 17 02/25/2019 1222   CREATININE 1.57 (H) 02/09/2022 1255      Component Value Date/Time   CALCIUM 8.8 (L) 02/09/2022 1255   ALKPHOS 59 02/09/2022 1255   AST 19 02/09/2022 1255   ALT 39 02/09/2022 1255   BILITOT 0.2 (L) 02/09/2022 1255

## 2022-02-09 NOTE — Assessment & Plan Note (Signed)
She has poorly controlled pain She has intermittent acute on chronic renal failure We discussed importance of lifestyle changes I plan to increase the dose of her pain medicine and switch over to morphine I will see her next week for further follow-up I recommend minimum 3 cycles of treatment before repeat imaging study

## 2022-02-09 NOTE — Assessment & Plan Note (Signed)
She has severe constipation I will prescribe aggressive laxative therapy

## 2022-02-09 NOTE — Assessment & Plan Note (Signed)
She has intermittent acute on chronic renal failure We discussed importance of hydration and risk factor modification 

## 2022-02-09 NOTE — Assessment & Plan Note (Signed)
This is due to recent treatment She is not symptomatic Observe only She does not need transfusion support today

## 2022-02-10 ENCOUNTER — Telehealth: Payer: Self-pay

## 2022-02-10 ENCOUNTER — Encounter: Payer: Self-pay | Admitting: Hematology and Oncology

## 2022-02-10 ENCOUNTER — Other Ambulatory Visit (HOSPITAL_COMMUNITY): Payer: Self-pay

## 2022-02-10 NOTE — Telephone Encounter (Signed)
Called and given below message. She verbalized understanding. She had a small bm this am and feels better. She is taking metamucil and dulcolax for constipation. Told her to continue laxatives and drink lots of water.  Told her she can try Miralax BID prn and senokot 2 tabs TID prn if she wants to switch from what she is currently taking. She voiced understanding and will think about it.

## 2022-02-10 NOTE — Telephone Encounter (Signed)
-----   Message from Heath Lark, MD sent at 02/10/2022  7:54 AM EDT ----- Can you call her after 10 and asked if she has bowel movement yet?

## 2022-02-17 ENCOUNTER — Encounter: Payer: Self-pay | Admitting: Hematology and Oncology

## 2022-02-17 ENCOUNTER — Inpatient Hospital Stay: Payer: Medicare Other | Attending: Hematology and Oncology

## 2022-02-17 ENCOUNTER — Inpatient Hospital Stay (HOSPITAL_BASED_OUTPATIENT_CLINIC_OR_DEPARTMENT_OTHER): Payer: Medicare Other | Admitting: Hematology and Oncology

## 2022-02-17 ENCOUNTER — Other Ambulatory Visit: Payer: Self-pay

## 2022-02-17 DIAGNOSIS — N183 Chronic kidney disease, stage 3 unspecified: Secondary | ICD-10-CM

## 2022-02-17 DIAGNOSIS — K5909 Other constipation: Secondary | ICD-10-CM | POA: Diagnosis not present

## 2022-02-17 DIAGNOSIS — G893 Neoplasm related pain (acute) (chronic): Secondary | ICD-10-CM | POA: Diagnosis not present

## 2022-02-17 DIAGNOSIS — Z5111 Encounter for antineoplastic chemotherapy: Secondary | ICD-10-CM | POA: Insufficient documentation

## 2022-02-17 DIAGNOSIS — C541 Malignant neoplasm of endometrium: Secondary | ICD-10-CM | POA: Diagnosis present

## 2022-02-17 DIAGNOSIS — Z5112 Encounter for antineoplastic immunotherapy: Secondary | ICD-10-CM | POA: Insufficient documentation

## 2022-02-17 LAB — CBC WITH DIFFERENTIAL (CANCER CENTER ONLY)
Abs Immature Granulocytes: 0.04 10*3/uL (ref 0.00–0.07)
Basophils Absolute: 0.1 10*3/uL (ref 0.0–0.1)
Basophils Relative: 1 %
Eosinophils Absolute: 0.1 10*3/uL (ref 0.0–0.5)
Eosinophils Relative: 1 %
HCT: 33.5 % — ABNORMAL LOW (ref 36.0–46.0)
Hemoglobin: 11.1 g/dL — ABNORMAL LOW (ref 12.0–15.0)
Immature Granulocytes: 1 %
Lymphocytes Relative: 37 %
Lymphs Abs: 2.5 10*3/uL (ref 0.7–4.0)
MCH: 27.2 pg (ref 26.0–34.0)
MCHC: 33.1 g/dL (ref 30.0–36.0)
MCV: 82.1 fL (ref 80.0–100.0)
Monocytes Absolute: 1.2 10*3/uL — ABNORMAL HIGH (ref 0.1–1.0)
Monocytes Relative: 18 %
Neutro Abs: 2.9 10*3/uL (ref 1.7–7.7)
Neutrophils Relative %: 42 %
Platelet Count: 286 10*3/uL (ref 150–400)
RBC: 4.08 MIL/uL (ref 3.87–5.11)
RDW: 15.7 % — ABNORMAL HIGH (ref 11.5–15.5)
WBC Count: 6.8 10*3/uL (ref 4.0–10.5)
nRBC: 0 % (ref 0.0–0.2)

## 2022-02-17 LAB — CMP (CANCER CENTER ONLY)
ALT: 14 U/L (ref 0–44)
AST: 14 U/L — ABNORMAL LOW (ref 15–41)
Albumin: 3.8 g/dL (ref 3.5–5.0)
Alkaline Phosphatase: 69 U/L (ref 38–126)
Anion gap: 7 (ref 5–15)
BUN: 20 mg/dL (ref 6–20)
CO2: 28 mmol/L (ref 22–32)
Calcium: 8.9 mg/dL (ref 8.9–10.3)
Chloride: 100 mmol/L (ref 98–111)
Creatinine: 1.4 mg/dL — ABNORMAL HIGH (ref 0.44–1.00)
GFR, Estimated: 44 mL/min — ABNORMAL LOW (ref >60)
Glucose, Bld: 182 mg/dL — ABNORMAL HIGH (ref 70–99)
Potassium: 3.7 mmol/L (ref 3.5–5.1)
Sodium: 135 mmol/L (ref 135–145)
Total Bilirubin: 0.4 mg/dL (ref 0.3–1.2)
Total Protein: 7.9 g/dL (ref 6.5–8.1)

## 2022-02-17 MED ORDER — HEPARIN SOD (PORK) LOCK FLUSH 100 UNIT/ML IV SOLN
500.0000 [IU] | Freq: Once | INTRAVENOUS | Status: AC
  Administered 2022-02-17: 500 [IU]

## 2022-02-17 MED ORDER — SODIUM CHLORIDE 0.9% FLUSH
10.0000 mL | Freq: Once | INTRAVENOUS | Status: AC
  Administered 2022-02-17: 10 mL

## 2022-02-17 NOTE — Assessment & Plan Note (Signed)
She has intermittent acute on chronic renal failure We discussed importance of hydration and risk factor modification 

## 2022-02-17 NOTE — Assessment & Plan Note (Signed)
We discussed the importance of aggressive laxative therapy 

## 2022-02-17 NOTE — Assessment & Plan Note (Signed)
Her pain is better controlled with morphine sulfate I warned her about risk of constipation and we discussed narcotic refill policy

## 2022-02-17 NOTE — Progress Notes (Signed)
[place] OFFICE PROGRESS NOTE  Patient Care Team: Drue Flirt, MD as PCP - General (Family Medicine) Lorretta Harp, MD as PCP - Cardiology (Cardiology) Heath Lark, MD as Consulting Physician (Hematology and Oncology)  ASSESSMENT & PLAN:  Endometrial cancer [facility]) She has lost some weight since last time I saw her We discussed importance of frequent small meals, high-protein intake and increase oral fluid hydration I will readjust the dose of her treatment based on her creatinine and weight today She will proceed with treatment as scheduled next week I will see her back in 3 weeks prior to cycle 3 of therapy I plan to repeat imaging study mid August  CKD (chronic kidney disease), stage III ([place]) She has intermittent acute on chronic renal failure We discussed importance of hydration and risk factor modification  Cancer related pain Her pain is better controlled with morphine sulfate I warned her about risk of constipation and we discussed narcotic refill policy  Other constipation We discussed the importance of aggressive laxative therapy  No orders of the defined types were placed in this encounter.   All questions were answered. The patient knows to call the clinic with any problems, questions or concerns. The total time spent in the appointment was 30 minutes encounter with patients including review of chart and various tests results, discussions about plan of care and coordination of care plan   Heath Lark, MD 02/17/2022 11:54 AM  INTERVAL HISTORY: Please see below for problem oriented charting. she returns for treatment follow-up with her mother She is doing well except she have lost some weight Her appetite is fair Her pain is well controlled She denies recent constipation after aggressive laxative No nausea vomiting  REVIEW OF SYSTEMS:   Constitutional: Denies fevers, chills or abnormal weight loss Eyes: Denies blurriness of  vision Ears, nose, mouth, throat, and face: Denies mucositis or sore throat Respiratory: Denies cough, dyspnea or wheezes Cardiovascular: Denies palpitation, chest discomfort or lower extremity swelling Gastrointestinal:  Denies nausea, heartburn or change in bowel habits Skin: Denies abnormal skin rashes Lymphatics: Denies new lymphadenopathy or easy bruising Neurological:Denies numbness, tingling or new weaknesses Behavioral/Psych: Mood is stable, no new changes  All other systems were reviewed with the patient and are negative.  I have reviewed the past medical history, past surgical history, social history and family history with the patient and they are unchanged from previous note.  ALLERGIES:  is allergic to statins and zetia [ezetimibe].  MEDICATIONS:  Current Outpatient Medications  Medication Sig Dispense Refill   acetaminophen (TYLENOL) 500 MG tablet Take 1,000 mg by mouth every 6 (six) hours as needed for mild pain or headache.     amLODipine (NORVASC) 5 MG tablet Take 1 tablet (5 mg total) by mouth daily. 30 tablet 0   aspirin EC 81 MG tablet Take 1 tablet (81 mg total) by mouth daily. 30 tablet 0   clopidogrel (PLAVIX) 75 MG tablet Take 1 tablet (75 mg total) by mouth daily. 30 tablet 0   dexamethasone (DECADRON) 4 MG tablet Take 2 tablets by mouth the night before and 2 tabs the morning of chemotherapy, every 3 weeks for 6 cycles 36 tablet 6   Evolocumab (REPATHA SURECLICK) 561 MG/ML SOAJ Inject 140 mg into the skin every 14 (fourteen) days. (Patient not taking: Reported on 01/28/2022) 2 pen 11   lidocaine (LIDODERM) 5 % Place 2 patches onto the skin daily. Remove & Discard patch within 12 hours or as directed by  MD 30 patch 0   lidocaine-prilocaine (EMLA) cream Apply 1 application topically as needed. 30 g 0   loratadine (CLARITIN) 10 MG tablet Take 1 tablet (10 mg total) by mouth daily as needed for allergies. (Patient not taking: Reported on 01/28/2022) 30 tablet 11    metFORMIN (GLUCOPHAGE) 1000 MG tablet Take 1,000 mg by mouth 2 (two) times daily with a meal. (Patient not taking: Reported on 01/28/2022)     metoprolol tartrate (LOPRESSOR) 25 MG tablet Take 1 tablet (25 mg total) by mouth 2 (two) times daily. 60 tablet 0   morphine (MSIR) 15 MG tablet Take 1 tablet (15 mg total) by mouth every 4 (four) hours as needed for severe pain. 30 tablet 0   nitroGLYCERIN (NITROSTAT) 0.4 MG SL tablet Place 1 tablet (0.4 mg total) under the tongue every 5 (five) minutes as needed for chest pain. (Patient not taking: Reported on 01/28/2022) 30 tablet 2   ondansetron (ZOFRAN) 8 MG tablet Take 1 tablet (8 mg total) by mouth every 8 (eight) hours as needed for nausea. 30 tablet 3   polyethylene glycol (MIRALAX) 17 g packet Take 17 g by mouth daily. 30 each 1   prochlorperazine (COMPAZINE) 10 MG tablet Take 1 tablet (10 mg total) by mouth every 6 (six) hours as needed for nausea or vomiting. 30 tablet 0   senna-docusate (SENOKOT-S) 8.6-50 MG tablet Take 2 tablets by mouth 2 (two) times daily. 90 tablet 1   No current facility-administered medications for this visit.    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    12/14/2020 Imaging   CT chest abdomen and pelvis IMPRESSION:  1.  2 small nodules in the right lung, nonspecific.  2.  Prominent precarinal lymph node versus adjacent small nodes.  3.  Enlarged heterogeneous uterine fundus.  4.  No definite adenopathy in the abdomen or pelvis. Nonspecific small lymph nodes are present, not enlarged by CT criteria.  5.  Mild wall thickening of the anterior aspect of the urinary bladder which may be due to to nondistention or inflammation.    12/27/2020 Pathology Results   SPECIMEN     Procedure:     Total hysterectomy and bilateral salpingo-oophorectomy   TUMOR     Histologic Type:    Serous carcinoma     Histologic Grade:    Not applicable     Myometrial Invasion:    Present       Depth of Myometrial Invasion:    11 mm       Myometrial Thickness:    12 mm       Percentage of Myometrial Invasion:    92 %     Uterine Serosa Involvement:    Not identified     Cervical Stromal Involvement:    Not identified     Other Tissue / Organ Involvement:    Not identified     Peritoneal / Ascitic Fluid:    Not identified     Lymphovascular Invasion (LVI):    Present   MARGINS     Margin Status:    Not applicable   REGIONAL LYMPH NODES     Regional Lymph Node Status:           :    All  regional lymph nodes negative for tumor cells         Lymph Nodes Examined:               Total Number of Pelvic Nodes Examined:    3           Number of Pelvic Sentinel Nodes Examined:    3           Total Number of Para-aortic Nodes Examined:    0           Number of Para-aortic Sentinel Nodes Examined:    Not applicable   DISTANT METASTASIS     Distant Site(s) Involved:    Not applicable   PATHOLOGIC STAGE CLASSIFICATION (pTNM, AJCC 8th Edition)     Reporting of pT, pN, and (when applicable) pM categories is based on information available to the pathologist at the time the report is issued. As per the AJCC (Chapter 1, 8th Ed.) it is the managing physician's responsibility to establish the final pathologic stage based upon all pertinent information, including but potentially not limited to this pathology report.     TNM Descriptors:    Not applicable     Tumor Modifier:    Not applicable     pT Category:    pT1b     Regional Lymph Nodes Modifier:    (sn)     pN Category:    pN0   The endometrial adenocarcinoma was analyzed by IHC for DNA mismatch repair proteins.  The neoplasm retained nuclear expression of all four mismatch repair proteins, MLH1, PMS2, MSH2, MSH6.    Estrogen receptors (ER):  Negative Progesterone receptors (PR): Negative  Controls worked appropriately.   A HER2 stain is POSITIVE (3+).   12/27/2020 Surgery   At [facility], she underwent a robotic hysterectomy/BSO, sentinel node dissection and partial omentectomy. She was found to have an enlarged uterus. An enlarged right pelvic sentinel node.    02/23/2021 Echocardiogram   Left Ventricle: Systolic function is normal. EF: 55-60%.    Left Ventricle: Doppler parameters consistent with mild diastolic  dysfunction and low to normal LA pressure.    Left Ventricle: There is mild concentric hypertrophy.    Aortic Valve: Mild aortic valve regurgitation.   01/27/2022 Imaging   CT chest imaging 1. No pulmonary embolus. 2. Findings consistent with intrathoracic malignancy. Multiple pulmonary nodules throughout both lungs of varying sizes, consistent with metastatic disease. Primary site of malignancy may represent a 4 cm spiculated nodule in the right perihilar region spanning the fissure that is cavitating. Alternatively all of these nodules may be metastatic given history of cancer, type not specified.  3. Multifocal mediastinal and bilateral hilar adenopathy, consistent metastatic disease. 4. Nondisplaced left anterior seventh and eighth rib fractures, soft tissue thickening adjacent to the seventh rib fracture suggests this may be pathologic 5. Right hydronephrosis is partially included in the field of view in the upper abdomen. Small soft tissue density medial to the right hepatic lobe posterior to the right kidney is nonspecific but may represent metastatic disease. Recommend staging CT of the abdomen and pelvis with oral and IV contrast. 6. Moderate emphysema. 7. Aortic atherosclerosis.  Coronary artery calcifications.   01/28/2022 Initial Diagnosis   Endometrial cancer ([place])   01/28/2022 Cancer Staging   Staging form: Corpus Uteri - Carcinoma and Carcinosarcoma, AJCC 8th Edition - Clinical stage from 01/28/2022: FIGO  Stage IVB (rcT1b, cN2a, cM1) - Signed by Heath Lark, MD on 01/28/2022 Stage prefix: Recurrence  01/28/2022 Imaging   CT abdomen and pelvis 1. Multiple abdominal and pelvic metastatic implants as above. 2. Moderate right hydronephrosis secondary to mass effect and obstruction of the distal right ureter by a right pelvic metastatic implant. 3. Multiple pulmonary metastatic disease in the visualized right lung base. 4. No bowel obstruction. Normal appendix. 5. Aortic Atherosclerosis (ICD10-I70.0).   01/30/2022 Echocardiogram    1. Left ventricular ejection fraction, by estimation, is 55 to 60%. The left ventricle has normal function. The left ventricle has no regional wall motion abnormalities. There is moderate concentric left ventricular  hypertrophy. Left ventricular diastolic parameters are consistent with Grade I diastolic dysfunction (impaired relaxation).   2. Right ventricular systolic function is normal. The right ventricular size is normal. There is normal pulmonary artery systolic pressure.   3. Left atrial size was mildly dilated.   4. The mitral valve is normal in structure. Trivial mitral valve regurgitation. No evidence of mitral stenosis.   5. The aortic valve is tricuspid. Aortic valve regurgitation is trivial. No aortic stenosis is present.   6. Aortic dilatation noted. There is mild dilatation of the aortic root, measuring 39 mm.   7. The inferior vena cava is normal in size with <50% respiratory variability, suggesting right atrial pressure of 8 mmHg.    01/31/2022 -  Chemotherapy   Patient is on Treatment Plan : UTERINE SEROUS CARCINOMA Carboplatin + Paclitaxel + Trastuzumab q21d x 6 Cycles / Trastuzumab q21d       PHYSICAL EXAMINATION: ECOG PERFORMANCE STATUS: 1 - Symptomatic but completely ambulatory  Vitals:   02/17/22 1014  BP: (!) 170/91  Pulse: 76  Resp: 18  Temp: 98 F (36.7 C)  SpO2: 97%   Filed Weights   02/17/22 1014  Weight: 149 lb (67.6 kg)     GENERAL:alert, no distress and comfortable NEURO: alert & oriented x 3 with fluent speech, no focal motor/sensory deficits  LABORATORY DATA:  I have reviewed the data as listed    Component Value Date/Time   NA 135 02/17/2022 1000   NA 141 02/25/2019 1222   K 3.7 02/17/2022 1000   CL 100 02/17/2022 1000   CO2 28 02/17/2022 1000   GLUCOSE 182 (H) 02/17/2022 1000   BUN 20 02/17/2022 1000   BUN 17 02/25/2019 1222   CREATININE 1.40 (H) 02/17/2022 1000   CALCIUM 8.9 02/17/2022 1000   PROT 7.9 02/17/2022 1000   PROT 6.9 09/22/2019 1419   ALBUMIN 3.8 02/17/2022 1000   ALBUMIN 3.9 09/22/2019 1419   AST 14 (L) 02/17/2022 1000   ALT 14 02/17/2022 1000   ALKPHOS 69 02/17/2022 1000   BILITOT 0.4 02/17/2022 1000   GFRNONAA 44 (L) 02/17/2022 1000   GFRAA >60 05/16/2019 0335    No results found for: "SPEP", "UPEP"  Lab Results  Component Value Date   WBC 6.8 02/17/2022   NEUTROABS 2.9 02/17/2022   HGB 11.1 (L) 02/17/2022   HCT 33.5 (L) 02/17/2022   MCV 82.1 02/17/2022   PLT 286 02/17/2022      Chemistry      Component Value Date/Time   NA 135 02/17/2022 1000   NA 141 02/25/2019 1222   K 3.7 02/17/2022 1000   CL 100 02/17/2022 1000   CO2 28 02/17/2022 1000   BUN 20 02/17/2022 1000   BUN 17 02/25/2019 1222   CREATININE 1.40 (H) 02/17/2022 1000      Component Value Date/Time   CALCIUM 8.9 02/17/2022 1000   ALKPHOS 69 02/17/2022 1000  AST 14 (L) 02/17/2022 1000   ALT 14 02/17/2022 1000   BILITOT 0.4 02/17/2022 1000

## 2022-02-17 NOTE — Assessment & Plan Note (Signed)
She has lost some weight since last time I saw her We discussed importance of frequent small meals, high-protein intake and increase oral fluid hydration I will readjust the dose of her treatment based on her creatinine and weight today She will proceed with treatment as scheduled next week I will see her back in 3 weeks prior to cycle 3 of therapy I plan to repeat imaging study mid August

## 2022-02-20 ENCOUNTER — Inpatient Hospital Stay: Payer: Medicare Other

## 2022-02-20 ENCOUNTER — Other Ambulatory Visit: Payer: Self-pay

## 2022-02-20 MED FILL — Dexamethasone Sodium Phosphate Inj 100 MG/10ML: INTRAMUSCULAR | Qty: 1 | Status: AC

## 2022-02-20 MED FILL — Fosaprepitant Dimeglumine For IV Infusion 150 MG (Base Eq): INTRAVENOUS | Qty: 5 | Status: AC

## 2022-02-20 NOTE — Progress Notes (Signed)
[facility]  Patient presented to [facility]  to review and complete healthcare advance directives.  Clinical Social Worker met with patient and her mother, [name].  The patient designated Johnathan Hausen Row as their primary healthcare agent and Dorise Bullion as their secondary agent.  Patient also completed healthcare living will.    Documents were notarized and copies made for patient/family. Clinical Social Worker will send documents to medical records to be scanned into patient's chart. Clinical Social Worker encouraged patient/family to contact with any additional questions or concerns.   Margaree Mackintosh, LCSW Clinical Social Worker [facility]

## 2022-02-21 ENCOUNTER — Inpatient Hospital Stay: Payer: Medicare Other

## 2022-02-21 ENCOUNTER — Inpatient Hospital Stay: Payer: Medicare Other | Admitting: Nurse Practitioner

## 2022-02-21 VITALS — BP 182/98 | HR 78 | Temp 97.9°F | Resp 17 | Wt 150.5 lb

## 2022-02-21 DIAGNOSIS — C541 Malignant neoplasm of endometrium: Secondary | ICD-10-CM

## 2022-02-21 DIAGNOSIS — Z5112 Encounter for antineoplastic immunotherapy: Secondary | ICD-10-CM | POA: Diagnosis not present

## 2022-02-21 MED ORDER — DIPHENHYDRAMINE HCL 50 MG/ML IJ SOLN
25.0000 mg | Freq: Once | INTRAMUSCULAR | Status: AC
  Administered 2022-02-21: 25 mg via INTRAVENOUS
  Filled 2022-02-21: qty 1

## 2022-02-21 MED ORDER — SODIUM CHLORIDE 0.9 % IV SOLN
150.0000 mg | Freq: Once | INTRAVENOUS | Status: AC
  Administered 2022-02-21: 150 mg via INTRAVENOUS
  Filled 2022-02-21: qty 150

## 2022-02-21 MED ORDER — TRASTUZUMAB-DKST CHEMO 150 MG IV SOLR
6.0000 mg/kg | Freq: Once | INTRAVENOUS | Status: AC
  Administered 2022-02-21: 399 mg via INTRAVENOUS
  Filled 2022-02-21: qty 19

## 2022-02-21 MED ORDER — SODIUM CHLORIDE 0.9 % IV SOLN: Freq: Once | INTRAVENOUS | Status: AC

## 2022-02-21 MED ORDER — ACETAMINOPHEN 325 MG PO TABS
650.0000 mg | ORAL_TABLET | Freq: Once | ORAL | Status: AC
  Administered 2022-02-21: 650 mg via ORAL
  Filled 2022-02-21: qty 2

## 2022-02-21 MED ORDER — SODIUM CHLORIDE 0.9 % IV SOLN
131.2500 mg/m2 | Freq: Once | INTRAVENOUS | Status: AC
  Administered 2022-02-21: 228 mg via INTRAVENOUS
  Filled 2022-02-21: qty 38

## 2022-02-21 MED ORDER — SODIUM CHLORIDE 0.9% FLUSH
10.0000 mL | INTRAVENOUS | Status: DC | PRN
  Administered 2022-02-21: 10 mL

## 2022-02-21 MED ORDER — SODIUM CHLORIDE 0.9 % IV SOLN
358.5000 mg | Freq: Once | INTRAVENOUS | Status: AC
  Administered 2022-02-21: 360 mg via INTRAVENOUS
  Filled 2022-02-21: qty 36

## 2022-02-21 MED ORDER — PALONOSETRON HCL INJECTION 0.25 MG/5ML
0.2500 mg | Freq: Once | INTRAVENOUS | Status: AC
  Administered 2022-02-21: 0.25 mg via INTRAVENOUS
  Filled 2022-02-21: qty 5

## 2022-02-21 MED ORDER — HEPARIN SOD (PORK) LOCK FLUSH 100 UNIT/ML IV SOLN
500.0000 [IU] | Freq: Once | INTRAVENOUS | Status: AC | PRN
  Administered 2022-02-21: 500 [IU]

## 2022-02-21 MED ORDER — FAMOTIDINE IN NACL 20-0.9 MG/50ML-% IV SOLN
20.0000 mg | Freq: Once | INTRAVENOUS | Status: AC
  Administered 2022-02-21: 20 mg via INTRAVENOUS
  Filled 2022-02-21: qty 50

## 2022-02-21 MED ORDER — SODIUM CHLORIDE 0.9 % IV SOLN
10.0000 mg | Freq: Once | INTRAVENOUS | Status: AC
  Administered 2022-02-21: 10 mg via INTRAVENOUS
  Filled 2022-02-21: qty 10

## 2022-02-21 NOTE — Patient Instructions (Signed)
[facility] MEDICAL [facility]COLOGY  Discharge Instructions: Thank you for choosing [facility] to provide your oncology and hematology care.   If you have a lab appointment with the Cancer Center, please go directly to the Cancer Center and check in at the registration area.   Wear comfortable clothing and clothing appropriate for easy access to any Portacath or PICC line.   We strive to give you quality time with your provider. You may need to reschedule your appointment if you arrive late (15 or more minutes).  Arriving late affects you and other patients whose appointments are after yours.  Also, if you miss three or more appointments without notifying the office, you may be dismissed from the clinic at the provider's discretion.      For prescription refill requests, have your pharmacy contact our office and allow 72 hours for refills to be completed.    Today you received the following chemotherapy and/or immunotherapy agents : Taxol, Carboplatin      To help prevent nausea and vomiting after your treatment, we encourage you to take your nausea medication as directed.  BELOW ARE SYMPTOMS THAT SHOULD BE REPORTED IMMEDIATELY: *FEVER GREATER THAN 100.4 F (38 C) OR HIGHER *CHILLS OR SWEATING *NAUSEA AND VOMITING THAT IS NOT CONTROLLED WITH YOUR NAUSEA MEDICATION *UNUSUAL SHORTNESS OF BREATH *UNUSUAL BRUISING OR BLEEDING *URINARY PROBLEMS (pain or burning when urinating, or frequent urination) *BOWEL PROBLEMS (unusual diarrhea, constipation, pain near the anus) TENDERNESS IN MOUTH AND THROAT WITH OR WITHOUT PRESENCE OF ULCERS (sore throat, sores in mouth, or a toothache) UNUSUAL RASH, SWELLING OR PAIN  UNUSUAL VAGINAL DISCHARGE OR ITCHING   Items with * indicate a potential emergency and should be followed up as soon as possible or go to the Emergency Department if any problems should occur.  Please show the CHEMOTHERAPY ALERT CARD or IMMUNOTHERAPY ALERT CARD at  check-in to the Emergency Department and triage nurse.  Should you have questions after your visit or need to cancel or reschedule your appointment, please contact [facility]COL[facility]  Dept: [phone]  and follow the prompts.  Office hours are 8:00 a.m. to 4:30 p.m. Monday - Friday. Please note that voicemails left after 4:00 p.m. may not be returned until the following business day.  We are closed weekends and major holidays. You have access to a nurse at all times for urgent questions. Please call the main number to the clinic Dept: [phone] and follow the prompts.   For any non-urgent questions, you may also contact your provider using MyChart. We now offer e-Visits for anyone 18 and older to request care online for non-urgent symptoms. For details visit [email or web address].   Also download the MyChart app! Go to the app store, search "MyChart", open the app, select , and log in with your MyChart username and password.  Masks are optional in the cancer centers. If you would like for your care team to wear a mask while they are taking care of you, please let them know. For doctor visits, patients may have with them one support person who is at least 59 years old. At this time, visitors are not allowed in the infusion area. 

## 2022-02-21 NOTE — Progress Notes (Signed)
Patient BP elevated during treatment. Patient stated that she forgot to take her BP medication today. Reminded patient to take her medications when she got home and recheck her BP prior to going to bed. If remains high then she needs to follow up with MD. Patient verbalized understanding

## 2022-03-13 ENCOUNTER — Other Ambulatory Visit: Payer: Self-pay | Admitting: Hematology and Oncology

## 2022-03-13 MED FILL — Fosaprepitant Dimeglumine For IV Infusion 150 MG (Base Eq): INTRAVENOUS | Qty: 5 | Status: AC

## 2022-03-13 MED FILL — Dexamethasone Sodium Phosphate Inj 100 MG/10ML: INTRAMUSCULAR | Qty: 1 | Status: AC

## 2022-03-14 ENCOUNTER — Encounter: Payer: Self-pay | Admitting: Hematology and Oncology

## 2022-03-14 ENCOUNTER — Other Ambulatory Visit: Payer: Self-pay | Admitting: Hematology and Oncology

## 2022-03-14 ENCOUNTER — Inpatient Hospital Stay: Payer: Medicare Other | Attending: Hematology and Oncology

## 2022-03-14 ENCOUNTER — Other Ambulatory Visit: Payer: Self-pay

## 2022-03-14 ENCOUNTER — Inpatient Hospital Stay (HOSPITAL_BASED_OUTPATIENT_CLINIC_OR_DEPARTMENT_OTHER): Payer: Medicare Other | Admitting: Hematology and Oncology

## 2022-03-14 ENCOUNTER — Inpatient Hospital Stay: Payer: Medicare Other

## 2022-03-14 DIAGNOSIS — G893 Neoplasm related pain (acute) (chronic): Secondary | ICD-10-CM | POA: Diagnosis not present

## 2022-03-14 DIAGNOSIS — C541 Malignant neoplasm of endometrium: Secondary | ICD-10-CM

## 2022-03-14 DIAGNOSIS — Z79899 Other long term (current) drug therapy: Secondary | ICD-10-CM | POA: Insufficient documentation

## 2022-03-14 DIAGNOSIS — G620 Drug-induced polyneuropathy: Secondary | ICD-10-CM | POA: Diagnosis not present

## 2022-03-14 DIAGNOSIS — Z5112 Encounter for antineoplastic immunotherapy: Secondary | ICD-10-CM | POA: Diagnosis present

## 2022-03-14 DIAGNOSIS — Z5111 Encounter for antineoplastic chemotherapy: Secondary | ICD-10-CM | POA: Insufficient documentation

## 2022-03-14 DIAGNOSIS — C7800 Secondary malignant neoplasm of unspecified lung: Secondary | ICD-10-CM | POA: Insufficient documentation

## 2022-03-14 DIAGNOSIS — D6481 Anemia due to antineoplastic chemotherapy: Secondary | ICD-10-CM | POA: Diagnosis not present

## 2022-03-14 DIAGNOSIS — T451X5A Adverse effect of antineoplastic and immunosuppressive drugs, initial encounter: Secondary | ICD-10-CM

## 2022-03-14 LAB — CBC WITH DIFFERENTIAL (CANCER CENTER ONLY)
Abs Immature Granulocytes: 0.06 10*3/uL (ref 0.00–0.07)
Basophils Absolute: 0 10*3/uL (ref 0.0–0.1)
Basophils Relative: 0 %
Eosinophils Absolute: 0 10*3/uL (ref 0.0–0.5)
Eosinophils Relative: 0 %
HCT: 32.1 % — ABNORMAL LOW (ref 36.0–46.0)
Hemoglobin: 10.8 g/dL — ABNORMAL LOW (ref 12.0–15.0)
Immature Granulocytes: 1 %
Lymphocytes Relative: 12 %
Lymphs Abs: 1.1 10*3/uL (ref 0.7–4.0)
MCH: 27.6 pg (ref 26.0–34.0)
MCHC: 33.6 g/dL (ref 30.0–36.0)
MCV: 81.9 fL (ref 80.0–100.0)
Monocytes Absolute: 0.3 10*3/uL (ref 0.1–1.0)
Monocytes Relative: 3 %
Neutro Abs: 8 10*3/uL — ABNORMAL HIGH (ref 1.7–7.7)
Neutrophils Relative %: 84 %
Platelet Count: 263 10*3/uL (ref 150–400)
RBC: 3.92 MIL/uL (ref 3.87–5.11)
RDW: 16.1 % — ABNORMAL HIGH (ref 11.5–15.5)
WBC Count: 9.5 10*3/uL (ref 4.0–10.5)
nRBC: 0 % (ref 0.0–0.2)

## 2022-03-14 LAB — CMP (CANCER CENTER ONLY)
ALT: 11 U/L (ref 0–44)
AST: 19 U/L (ref 15–41)
Albumin: 3.7 g/dL (ref 3.5–5.0)
Alkaline Phosphatase: 60 U/L (ref 38–126)
Anion gap: 8 (ref 5–15)
BUN: 21 mg/dL — ABNORMAL HIGH (ref 6–20)
CO2: 25 mmol/L (ref 22–32)
Calcium: 8.6 mg/dL — ABNORMAL LOW (ref 8.9–10.3)
Chloride: 100 mmol/L (ref 98–111)
Creatinine: 1.39 mg/dL — ABNORMAL HIGH (ref 0.44–1.00)
GFR, Estimated: 44 mL/min — ABNORMAL LOW (ref >60)
Glucose, Bld: 277 mg/dL — ABNORMAL HIGH (ref 70–99)
Potassium: 3.8 mmol/L (ref 3.5–5.1)
Sodium: 133 mmol/L — ABNORMAL LOW (ref 135–145)
Total Bilirubin: 0.4 mg/dL (ref 0.3–1.2)
Total Protein: 8 g/dL (ref 6.5–8.1)

## 2022-03-14 MED ORDER — SODIUM CHLORIDE 0.9 % IV SOLN: Freq: Once | INTRAVENOUS | Status: AC

## 2022-03-14 MED ORDER — PALONOSETRON HCL INJECTION 0.25 MG/5ML
0.2500 mg | Freq: Once | INTRAVENOUS | Status: AC
  Administered 2022-03-14: 0.25 mg via INTRAVENOUS
  Filled 2022-03-14: qty 5

## 2022-03-14 MED ORDER — SODIUM CHLORIDE 0.9 % IV SOLN
340.0000 mg | Freq: Once | INTRAVENOUS | Status: AC
  Administered 2022-03-14: 340 mg via INTRAVENOUS
  Filled 2022-03-14: qty 34

## 2022-03-14 MED ORDER — SODIUM CHLORIDE 0.9% FLUSH
10.0000 mL | INTRAVENOUS | Status: DC | PRN
  Administered 2022-03-14: 10 mL

## 2022-03-14 MED ORDER — HEPARIN SOD (PORK) LOCK FLUSH 100 UNIT/ML IV SOLN
500.0000 [IU] | Freq: Once | INTRAVENOUS | Status: AC | PRN
  Administered 2022-03-14: 500 [IU]

## 2022-03-14 MED ORDER — SODIUM CHLORIDE 0.9 % IV SOLN
131.2500 mg/m2 | Freq: Once | INTRAVENOUS | Status: AC
  Administered 2022-03-14: 228 mg via INTRAVENOUS
  Filled 2022-03-14: qty 38

## 2022-03-14 MED ORDER — SODIUM CHLORIDE 0.9% FLUSH
10.0000 mL | Freq: Once | INTRAVENOUS | Status: AC
  Administered 2022-03-14: 10 mL

## 2022-03-14 MED ORDER — FAMOTIDINE IN NACL 20-0.9 MG/50ML-% IV SOLN
20.0000 mg | Freq: Once | INTRAVENOUS | Status: AC
  Administered 2022-03-14: 20 mg via INTRAVENOUS
  Filled 2022-03-14: qty 50

## 2022-03-14 MED ORDER — SODIUM CHLORIDE 0.9 % IV SOLN
150.0000 mg | Freq: Once | INTRAVENOUS | Status: AC
  Administered 2022-03-14: 150 mg via INTRAVENOUS
  Filled 2022-03-14: qty 150

## 2022-03-14 MED ORDER — ACETAMINOPHEN 325 MG PO TABS
650.0000 mg | ORAL_TABLET | Freq: Once | ORAL | Status: AC
  Administered 2022-03-14: 650 mg via ORAL
  Filled 2022-03-14: qty 2

## 2022-03-14 MED ORDER — TRASTUZUMAB-DKST CHEMO 150 MG IV SOLR
6.0000 mg/kg | Freq: Once | INTRAVENOUS | Status: AC
  Administered 2022-03-14: 399 mg via INTRAVENOUS
  Filled 2022-03-14: qty 19

## 2022-03-14 MED ORDER — SODIUM CHLORIDE 0.9 % IV SOLN
10.0000 mg | Freq: Once | INTRAVENOUS | Status: AC
  Administered 2022-03-14: 10 mg via INTRAVENOUS
  Filled 2022-03-14: qty 10

## 2022-03-14 MED ORDER — DIPHENHYDRAMINE HCL 50 MG/ML IJ SOLN
25.0000 mg | Freq: Once | INTRAMUSCULAR | Status: AC
  Administered 2022-03-14: 25 mg via INTRAVENOUS
  Filled 2022-03-14: qty 1

## 2022-03-14 NOTE — Assessment & Plan Note (Signed)
She has no pain I am excited of the prospect that the treatment is working She has prescription morphine sulfate to take as needed

## 2022-03-14 NOTE — Progress Notes (Signed)
[place] OFFICE PROGRESS NOTE  Patient Care Team: Drue Flirt, MD as PCP - General (Family Medicine) Lorretta Harp, MD as PCP - Cardiology (Cardiology) Heath Lark, MD as Consulting Physician (Hematology and Oncology)  ASSESSMENT & PLAN:  Endometrial cancer [facility]) She has lost some weight since last time I saw her We discussed importance of frequent small meals, high-protein intake and increase oral fluid hydration She will proceed with treatment as scheduled  I will see her back in 3 weeks prior to cycle 4 of therapy I plan to repeat imaging study mid August  Cancer related pain She has no pain I am excited of the prospect that the treatment is working She has prescription morphine sulfate to take as needed  Peripheral neuropathy due to chemotherapy [facility]) she has mild peripheral neuropathy, likely related to side effects of treatment. It is only mild, not bothering the patient. I will observe for now If it gets worse in the future, I will consider modifying the dose of the treatment   Anemia due to antineoplastic chemotherapy This is likely due to recent treatment. The patient denies recent history of bleeding such as epistaxis, hematuria or hematochezia. She is asymptomatic from the anemia. I will observe for now.  She does not require transfusion now. I will continue the chemotherapy at current dose without dosage adjustment.  If the anemia gets progressive worse in the future, I might have to delay her treatment or adjust the chemotherapy dose.   Orders Placed This Encounter  Procedures   CT ABDOMEN PELVIS W CONTRAST    Standing Status:   Future    Standing Expiration Date:   03/15/2023    Order Specific Question:   If indicated for the ordered procedure, I authorize the administration of contrast media per Radiology protocol    Answer:   Yes    Order Specific Question:   Preferred imaging location?    Answer:   [facility]    Order  Specific Question:   Radiology Contrast Protocol - do NOT remove file path    Answer:   [email or web address]    Order Specific Question:   Is patient pregnant?    Answer:   No    All questions were answered. The patient knows to call the clinic with any problems, questions or concerns. The total time spent in the appointment was 30 minutes encounter with patients including review of chart and various tests results, discussions about plan of care and coordination of care plan   Heath Lark, MD 03/14/2022 9:28 AM  INTERVAL HISTORY: Please see below for problem oriented charting. she returns for treatment follow-up with her mother She is feeling well She has mild peripheral neuropathy affecting her feet but not significant She only takes morphine once a week She denies recent constipation Her appetite is improved  REVIEW OF SYSTEMS:   Constitutional: Denies fevers, chills or abnormal weight loss Eyes: Denies blurriness of vision Ears, nose, mouth, throat, and face: Denies mucositis or sore throat Respiratory: Denies cough, dyspnea or wheezes Cardiovascular: Denies palpitation, chest discomfort or lower extremity swelling Gastrointestinal:  Denies nausea, heartburn or change in bowel habits Skin: Denies abnormal skin rashes Lymphatics: Denies new lymphadenopathy or easy bruising Neurological:Denies numbness, tingling or new weaknesses Behavioral/Psych: Mood is stable, no new changes  All other systems were reviewed with the patient and are negative.  I have reviewed the past medical history, past surgical history, social history and family history with the patient  and they are unchanged from previous note.  ALLERGIES:  is allergic to statins and zetia [ezetimibe].  MEDICATIONS:  Current Outpatient Medications  Medication Sig Dispense Refill   acetaminophen (TYLENOL) 500 MG tablet Take 1,000 mg by mouth every 6 (six) hours as needed for mild pain  or headache.     amLODipine (NORVASC) 5 MG tablet Take 1 tablet (5 mg total) by mouth daily. 30 tablet 0   dexamethasone (DECADRON) 4 MG tablet Take 2 tablets by mouth the night before and 2 tabs the morning of chemotherapy, every 3 weeks for 6 cycles 36 tablet 6   Evolocumab (REPATHA SURECLICK) 638 MG/ML SOAJ Inject 140 mg into the skin every 14 (fourteen) days. (Patient not taking: Reported on 01/28/2022) 2 pen 11   lidocaine (LIDODERM) 5 % Place 2 patches onto the skin daily. Remove & Discard patch within 12 hours or as directed by MD 30 patch 0   lidocaine-prilocaine (EMLA) cream Apply 1 application topically as needed. 30 g 0   loratadine (CLARITIN) 10 MG tablet Take 1 tablet (10 mg total) by mouth daily as needed for allergies. (Patient not taking: Reported on 01/28/2022) 30 tablet 11   metFORMIN (GLUCOPHAGE) 1000 MG tablet Take 1,000 mg by mouth 2 (two) times daily with a meal. (Patient not taking: Reported on 01/28/2022)     metoprolol tartrate (LOPRESSOR) 25 MG tablet Take 1 tablet (25 mg total) by mouth 2 (two) times daily. 60 tablet 0   morphine (MSIR) 15 MG tablet Take 1 tablet (15 mg total) by mouth every 4 (four) hours as needed for severe pain. 30 tablet 0   nitroGLYCERIN (NITROSTAT) 0.4 MG SL tablet Place 1 tablet (0.4 mg total) under the tongue every 5 (five) minutes as needed for chest pain. (Patient not taking: Reported on 01/28/2022) 30 tablet 2   ondansetron (ZOFRAN) 8 MG tablet Take 1 tablet (8 mg total) by mouth every 8 (eight) hours as needed for nausea. 30 tablet 3   polyethylene glycol (MIRALAX) 17 g packet Take 17 g by mouth daily. 30 each 1   prochlorperazine (COMPAZINE) 10 MG tablet Take 1 tablet (10 mg total) by mouth every 6 (six) hours as needed for nausea or vomiting. 30 tablet 0   senna-docusate (SENOKOT-S) 8.6-50 MG tablet Take 2 tablets by mouth 2 (two) times daily. 90 tablet 1   No current facility-administered medications for this visit.    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    12/14/2020 Imaging   CT chest abdomen and pelvis IMPRESSION:  1.  2 small nodules in the right lung, nonspecific.  2.  Prominent precarinal lymph node versus adjacent small nodes.  3.  Enlarged heterogeneous uterine fundus.  4.  No definite adenopathy in the abdomen or pelvis. Nonspecific small lymph nodes are present, not enlarged by CT criteria.  5.  Mild wall thickening of the anterior aspect of the urinary bladder which may be due to to nondistention or inflammation.    12/27/2020 Pathology Results   SPECIMEN     Procedure:    Total hysterectomy and bilateral salpingo-oophorectomy   TUMOR     Histologic Type:    Serous carcinoma     Histologic Grade:    Not applicable  Myometrial Invasion:    Present       Depth of Myometrial Invasion:    11 mm       Myometrial Thickness:    12 mm       Percentage of Myometrial Invasion:    92 %     Uterine Serosa Involvement:    Not identified     Cervical Stromal Involvement:    Not identified     Other Tissue / Organ Involvement:    Not identified     Peritoneal / Ascitic Fluid:    Not identified     Lymphovascular Invasion (LVI):    Present   MARGINS     Margin Status:    Not applicable   REGIONAL LYMPH NODES     Regional Lymph Node Status:           :    All regional lymph nodes negative for tumor cells         Lymph Nodes Examined:               Total Number of Pelvic Nodes Examined:    3           Number of Pelvic Sentinel Nodes Examined:    3           Total Number of Para-aortic Nodes Examined:    0           Number of Para-aortic Sentinel Nodes Examined:    Not applicable   DISTANT METASTASIS     Distant Site(s) Involved:    Not applicable   PATHOLOGIC STAGE  CLASSIFICATION (pTNM, AJCC 8th Edition)     Reporting of pT, pN, and (when applicable) pM categories is based on information available to the pathologist at the time the report is issued. As per the AJCC (Chapter 1, 8th Ed.) it is the managing physician's responsibility to establish the final pathologic stage based upon all pertinent information, including but potentially not limited to this pathology report.     TNM Descriptors:    Not applicable     Tumor Modifier:    Not applicable     pT Category:    pT1b     Regional Lymph Nodes Modifier:    (sn)     pN Category:    pN0   The endometrial adenocarcinoma was analyzed by IHC for DNA mismatch repair proteins.  The neoplasm retained nuclear expression of all four mismatch repair proteins, MLH1, PMS2, MSH2, MSH6.    Estrogen receptors (ER): Negative Progesterone receptors (PR): Negative  Controls worked appropriately.   A HER2 stain is POSITIVE (3+).   12/27/2020 Surgery   At [facility], she underwent a robotic hysterectomy/BSO, sentinel node dissection and partial omentectomy. She was found to have an enlarged uterus. An enlarged right pelvic sentinel node.    02/23/2021 Echocardiogram   Left Ventricle: Systolic function is normal. EF: 55-60%.    Left Ventricle: Doppler parameters consistent with mild diastolic  dysfunction and low to normal LA pressure.    Left Ventricle: There is mild concentric hypertrophy.    Aortic Valve: Mild aortic valve regurgitation.   01/27/2022 Imaging   CT chest imaging 1. No pulmonary embolus. 2. Findings consistent with intrathoracic malignancy. Multiple pulmonary nodules throughout both lungs of varying sizes, consistent with metastatic disease. Primary site of malignancy may represent a 4 cm spiculated nodule in the right perihilar region spanning the fissure that is cavitating. Alternatively all of these nodules may be metastatic given  history of cancer, type not specified.  3. Multifocal mediastinal and  bilateral hilar adenopathy, consistent metastatic disease. 4. Nondisplaced left anterior seventh and eighth rib fractures, soft tissue thickening adjacent to the seventh rib fracture suggests this may be pathologic 5. Right hydronephrosis is partially included in the field of view in the upper abdomen. Small soft tissue density medial to the right hepatic lobe posterior to the right kidney is nonspecific but may represent metastatic disease. Recommend staging CT of the abdomen and pelvis with oral and IV contrast. 6. Moderate emphysema. 7. Aortic atherosclerosis.  Coronary artery calcifications.   01/28/2022 Initial Diagnosis   Endometrial cancer ([place])   01/28/2022 Cancer Staging   Staging form: Corpus Uteri - Carcinoma and Carcinosarcoma, AJCC 8th Edition - Clinical stage from 01/28/2022: FIGO Stage IVB (rcT1b, cN2a, cM1) - Signed by Heath Lark, MD on 01/28/2022 Stage prefix: Recurrence   01/28/2022 Imaging   CT abdomen and pelvis 1. Multiple abdominal and pelvic metastatic implants as above. 2. Moderate right hydronephrosis secondary to mass effect and obstruction of the distal right ureter by a right pelvic metastatic implant. 3. Multiple pulmonary metastatic disease in the visualized right lung base. 4. No bowel obstruction. Normal appendix. 5. Aortic Atherosclerosis (ICD10-I70.0).   01/30/2022 Echocardiogram    1. Left ventricular ejection fraction, by estimation, is 55 to 60%. The left ventricle has normal function. The left ventricle has no regional wall motion abnormalities. There is moderate concentric left ventricular  hypertrophy. Left ventricular diastolic parameters are consistent with Grade I diastolic dysfunction (impaired relaxation).   2. Right ventricular systolic function is normal. The right ventricular size is normal. There is normal pulmonary artery systolic pressure.   3. Left atrial size was mildly dilated.   4. The mitral valve is normal in structure. Trivial mitral  valve regurgitation. No evidence of mitral stenosis.   5. The aortic valve is tricuspid. Aortic valve regurgitation is trivial. No aortic stenosis is present.   6. Aortic dilatation noted. There is mild dilatation of the aortic root, measuring 39 mm.   7. The inferior vena cava is normal in size with <50% respiratory variability, suggesting right atrial pressure of 8 mmHg.    01/31/2022 -  Chemotherapy   Patient is on Treatment Plan : UTERINE SEROUS CARCINOMA Carboplatin + Paclitaxel + Trastuzumab q21d x 6 Cycles / Trastuzumab q21d       PHYSICAL EXAMINATION: ECOG PERFORMANCE STATUS: 0 - Asymptomatic  Vitals:   03/14/22 0919  BP: (!) 171/93  Pulse: 68  Resp: 18  Temp: 98 F (36.7 C)  SpO2: 98%   Filed Weights   03/14/22 0919  Weight: 148 lb (67.1 kg)    GENERAL:alert, no distress and comfortable NEURO: alert & oriented x 3 with fluent speech, no focal motor/sensory deficits  LABORATORY DATA:  I have reviewed the data as listed    Component Value Date/Time   NA 135 02/17/2022 1000   NA 141 02/25/2019 1222   K 3.7 02/17/2022 1000   CL 100 02/17/2022 1000   CO2 28 02/17/2022 1000   GLUCOSE 182 (H) 02/17/2022 1000   BUN 20 02/17/2022 1000   BUN 17 02/25/2019 1222   CREATININE 1.40 (H) 02/17/2022 1000   CALCIUM 8.9 02/17/2022 1000   PROT 7.9 02/17/2022 1000   PROT 6.9 09/22/2019 1419   ALBUMIN 3.8 02/17/2022 1000   ALBUMIN 3.9 09/22/2019 1419   AST 14 (L) 02/17/2022 1000   ALT 14 02/17/2022 1000   ALKPHOS 69 02/17/2022 1000  BILITOT 0.4 02/17/2022 1000   GFRNONAA 44 (L) 02/17/2022 1000   GFRAA >60 05/16/2019 0335    No results found for: "SPEP", "UPEP"  Lab Results  Component Value Date   WBC 9.5 03/14/2022   NEUTROABS 8.0 (H) 03/14/2022   HGB 10.8 (L) 03/14/2022   HCT 32.1 (L) 03/14/2022   MCV 81.9 03/14/2022   PLT 263 03/14/2022      Chemistry      Component Value Date/Time   NA 135 02/17/2022 1000   NA 141 02/25/2019 1222   K 3.7 02/17/2022 1000    CL 100 02/17/2022 1000   CO2 28 02/17/2022 1000   BUN 20 02/17/2022 1000   BUN 17 02/25/2019 1222   CREATININE 1.40 (H) 02/17/2022 1000      Component Value Date/Time   CALCIUM 8.9 02/17/2022 1000   ALKPHOS 69 02/17/2022 1000   AST 14 (L) 02/17/2022 1000   ALT 14 02/17/2022 1000   BILITOT 0.4 02/17/2022 1000

## 2022-03-14 NOTE — Progress Notes (Unsigned)
Seen in the infusion room.Given 2 bottles of contrast and schedule for 8/21 appts, arrive at 1130 to [facility] for port/lab flush and go to radiology for CT at 1230. NPO 4 hours prior to CT. Drink the 1st bottle of contrast at 1030 and 2nd bottle at 1130. She verbalized understanding.

## 2022-03-14 NOTE — Assessment & Plan Note (Signed)
she has mild peripheral neuropathy, likely related to side effects of treatment. It is only mild, not bothering the patient. I will observe for now If it gets worse in the future, I will consider modifying the dose of the treatment  

## 2022-03-14 NOTE — Progress Notes (Signed)
Proceed with Carboplatin '340mg'$  per MD.  Acquanetta Belling, RPH, BCPS, BCOP 03/14/2022 10:26 AM

## 2022-03-14 NOTE — Assessment & Plan Note (Signed)

## 2022-03-14 NOTE — Patient Instructions (Signed)
[facility]  Discharge Instructions: Thank you for choosing [facility] to provide your oncology and hematology care.   If you have a lab appointment with the Cancer Center, please go directly to the Cancer Center and check in at the registration area.   Wear comfortable clothing and clothing appropriate for easy access to any Portacath or PICC line.   We strive to give you quality time with your provider. You may need to reschedule your appointment if you arrive late (15 or more minutes).  Arriving late affects you and other patients whose appointments are after yours.  Also, if you miss three or more appointments without notifying the office, you may be dismissed from the clinic at the provider's discretion.      For prescription refill requests, have your pharmacy contact our office and allow 72 hours for refills to be completed.    Today you received the following chemotherapy and/or immunotherapy agents : Taxol, Carboplatin      To help prevent nausea and vomiting after your treatment, we encourage you to take your nausea medication as directed.  BELOW ARE SYMPTOMS THAT SHOULD BE REPORTED IMMEDIATELY: *FEVER GREATER THAN 100.4 F (38 C) OR HIGHER *CHILLS OR SWEATING *NAUSEA AND VOMITING THAT IS NOT CONTROLLED WITH YOUR NAUSEA MEDICATION *UNUSUAL SHORTNESS OF BREATH *UNUSUAL BRUISING OR BLEEDING *URINARY PROBLEMS (pain or burning when urinating, or frequent urination) *BOWEL PROBLEMS (unusual diarrhea, constipation, pain near the anus) TENDERNESS IN MOUTH AND THROAT WITH OR WITHOUT PRESENCE OF ULCERS (sore throat, sores in mouth, or a toothache) UNUSUAL RASH, SWELLING OR PAIN  UNUSUAL VAGINAL DISCHARGE OR ITCHING   Items with * indicate a potential emergency and should be followed up as soon as possible or go to the Emergency Department if any problems should occur.  Please show the CHEMOTHERAPY ALERT CARD or IMMUNOTHERAPY ALERT CARD at  check-in to the Emergency Department and triage nurse.  Should you have questions after your visit or need to cancel or reschedule your appointment, please contact [facility]  Dept: [phone]  and follow the prompts.  Office hours are 8:00 a.m. to 4:30 p.m. Monday - Friday. Please note that voicemails left after 4:00 p.m. may not be returned until the following business day.  We are closed weekends and major holidays. You have access to a nurse at all times for urgent questions. Please call the main number to the clinic Dept: [phone] and follow the prompts.   For any non-urgent questions, you may also contact your provider using MyChart. We now offer e-Visits for anyone 18 and older to request care online for non-urgent symptoms. For details visit [email or web address].   Also download the MyChart app! Go to the app store, search "MyChart", open the app, select [facility], and log in with your MyChart username and password.  Masks are optional in the cancer centers. If you would like for your care team to wear a mask while they are taking care of you, please let them know. For doctor visits, patients may have with them one support person who is at least 59 years old. At this time, visitors are not allowed in the infusion area. 

## 2022-03-14 NOTE — Assessment & Plan Note (Signed)
She has lost some weight since last time I saw her We discussed importance of frequent small meals, high-protein intake and increase oral fluid hydration She will proceed with treatment as scheduled  I will see her back in 3 weeks prior to cycle 4 of therapy I plan to repeat imaging study mid August

## 2022-04-03 ENCOUNTER — Ambulatory Visit (HOSPITAL_COMMUNITY)
Admission: RE | Admit: 2022-04-03 | Discharge: 2022-04-03 | Disposition: A | Discharge status: Home / Self Care | Payer: Medicare Other | Source: Ambulatory Visit | Attending: Hematology and Oncology | Admitting: Hematology and Oncology

## 2022-04-03 ENCOUNTER — Inpatient Hospital Stay: Payer: Medicare Other

## 2022-04-03 ENCOUNTER — Other Ambulatory Visit: Payer: Medicare Other

## 2022-04-03 DIAGNOSIS — C541 Malignant neoplasm of endometrium: Secondary | ICD-10-CM | POA: Insufficient documentation

## 2022-04-03 LAB — CBC WITH DIFFERENTIAL (CANCER CENTER ONLY)
Abs Immature Granulocytes: 0.02 10*3/uL (ref 0.00–0.07)
Basophils Absolute: 0 10*3/uL (ref 0.0–0.1)
Basophils Relative: 0 %
Eosinophils Absolute: 0.1 10*3/uL (ref 0.0–0.5)
Eosinophils Relative: 1 %
HCT: 32.4 % — ABNORMAL LOW (ref 36.0–46.0)
Hemoglobin: 10.8 g/dL — ABNORMAL LOW (ref 12.0–15.0)
Immature Granulocytes: 0 %
Lymphocytes Relative: 36 %
Lymphs Abs: 2.8 10*3/uL (ref 0.7–4.0)
MCH: 27.4 pg (ref 26.0–34.0)
MCHC: 33.3 g/dL (ref 30.0–36.0)
MCV: 82.2 fL (ref 80.0–100.0)
Monocytes Absolute: 1.3 10*3/uL — ABNORMAL HIGH (ref 0.1–1.0)
Monocytes Relative: 17 %
Neutro Abs: 3.6 10*3/uL (ref 1.7–7.7)
Neutrophils Relative %: 46 %
Platelet Count: 261 10*3/uL (ref 150–400)
RBC: 3.94 MIL/uL (ref 3.87–5.11)
RDW: 17.1 % — ABNORMAL HIGH (ref 11.5–15.5)
WBC Count: 7.8 10*3/uL (ref 4.0–10.5)
nRBC: 0 % (ref 0.0–0.2)

## 2022-04-03 LAB — CMP (CANCER CENTER ONLY)
ALT: 10 U/L (ref 0–44)
AST: 13 U/L — ABNORMAL LOW (ref 15–41)
Albumin: 3.8 g/dL (ref 3.5–5.0)
Alkaline Phosphatase: 58 U/L (ref 38–126)
Anion gap: 4 — ABNORMAL LOW (ref 5–15)
BUN: 18 mg/dL (ref 6–20)
CO2: 31 mmol/L (ref 22–32)
Calcium: 9.1 mg/dL (ref 8.9–10.3)
Chloride: 102 mmol/L (ref 98–111)
Creatinine: 1.26 mg/dL — ABNORMAL HIGH (ref 0.44–1.00)
GFR, Estimated: 49 mL/min — ABNORMAL LOW (ref >60)
Glucose, Bld: 139 mg/dL — ABNORMAL HIGH (ref 70–99)
Potassium: 3.9 mmol/L (ref 3.5–5.1)
Sodium: 137 mmol/L (ref 135–145)
Total Bilirubin: 0.3 mg/dL (ref 0.3–1.2)
Total Protein: 7.4 g/dL (ref 6.5–8.1)

## 2022-04-03 MED ORDER — HEPARIN SOD (PORK) LOCK FLUSH 100 UNIT/ML IV SOLN: 500.0000 [IU] | Freq: Once | INTRAVENOUS | Status: AC

## 2022-04-03 MED ORDER — SODIUM CHLORIDE 0.9% FLUSH
10.0000 mL | Freq: Once | INTRAVENOUS | Status: AC
  Administered 2022-04-03: 10 mL

## 2022-04-03 MED ORDER — HEPARIN SOD (PORK) LOCK FLUSH 100 UNIT/ML IV SOLN
INTRAVENOUS | Status: AC
  Administered 2022-04-03: 500 [IU] via INTRAVENOUS
  Filled 2022-04-03: qty 5

## 2022-04-03 MED ORDER — IOHEXOL 300 MG/ML  SOLN
100.0000 mL | Freq: Once | INTRAMUSCULAR | Status: AC | PRN
  Administered 2022-04-03: 100 mL via INTRAVENOUS

## 2022-04-03 MED ORDER — HEPARIN SOD (PORK) LOCK FLUSH 100 UNIT/ML IV SOLN: 500.0000 [IU] | Freq: Once | INTRAVENOUS | Status: DC

## 2022-04-03 MED FILL — Dexamethasone Sodium Phosphate Inj 100 MG/10ML: INTRAMUSCULAR | Qty: 1 | Status: AC

## 2022-04-03 MED FILL — Fosaprepitant Dimeglumine For IV Infusion 150 MG (Base Eq): INTRAVENOUS | Qty: 5 | Status: AC

## 2022-04-04 ENCOUNTER — Inpatient Hospital Stay (HOSPITAL_BASED_OUTPATIENT_CLINIC_OR_DEPARTMENT_OTHER): Payer: Medicare Other | Admitting: Hematology and Oncology

## 2022-04-04 ENCOUNTER — Other Ambulatory Visit: Payer: Self-pay

## 2022-04-04 ENCOUNTER — Inpatient Hospital Stay: Payer: Medicare Other

## 2022-04-04 ENCOUNTER — Encounter: Payer: Self-pay | Admitting: Hematology and Oncology

## 2022-04-04 VITALS — BP 157/98 | HR 60 | Temp 97.9°F | Resp 20

## 2022-04-04 DIAGNOSIS — C7801 Secondary malignant neoplasm of right lung: Secondary | ICD-10-CM | POA: Diagnosis not present

## 2022-04-04 DIAGNOSIS — C541 Malignant neoplasm of endometrium: Secondary | ICD-10-CM

## 2022-04-04 DIAGNOSIS — T451X5A Adverse effect of antineoplastic and immunosuppressive drugs, initial encounter: Secondary | ICD-10-CM

## 2022-04-04 DIAGNOSIS — N1330 Unspecified hydronephrosis: Secondary | ICD-10-CM | POA: Diagnosis not present

## 2022-04-04 DIAGNOSIS — G620 Drug-induced polyneuropathy: Secondary | ICD-10-CM

## 2022-04-04 DIAGNOSIS — Z5112 Encounter for antineoplastic immunotherapy: Secondary | ICD-10-CM | POA: Diagnosis not present

## 2022-04-04 DIAGNOSIS — C7802 Secondary malignant neoplasm of left lung: Secondary | ICD-10-CM

## 2022-04-04 MED ORDER — SODIUM CHLORIDE 0.9% FLUSH
10.0000 mL | INTRAVENOUS | Status: DC | PRN
  Administered 2022-04-04: 10 mL

## 2022-04-04 MED ORDER — DIPHENHYDRAMINE HCL 50 MG/ML IJ SOLN
25.0000 mg | Freq: Once | INTRAMUSCULAR | Status: AC
  Administered 2022-04-04: 25 mg via INTRAVENOUS
  Filled 2022-04-04: qty 1

## 2022-04-04 MED ORDER — SODIUM CHLORIDE 0.9 % IV SOLN
10.0000 mg | Freq: Once | INTRAVENOUS | Status: AC
  Administered 2022-04-04: 10 mg via INTRAVENOUS
  Filled 2022-04-04: qty 10

## 2022-04-04 MED ORDER — FAMOTIDINE IN NACL 20-0.9 MG/50ML-% IV SOLN
20.0000 mg | Freq: Once | INTRAVENOUS | Status: AC
  Administered 2022-04-04: 20 mg via INTRAVENOUS
  Filled 2022-04-04: qty 50

## 2022-04-04 MED ORDER — HEPARIN SOD (PORK) LOCK FLUSH 100 UNIT/ML IV SOLN
500.0000 [IU] | Freq: Once | INTRAVENOUS | Status: AC | PRN
  Administered 2022-04-04: 500 [IU]

## 2022-04-04 MED ORDER — SODIUM CHLORIDE 0.9 % IV SOLN
150.0000 mg | Freq: Once | INTRAVENOUS | Status: AC
  Administered 2022-04-04: 150 mg via INTRAVENOUS
  Filled 2022-04-04: qty 150

## 2022-04-04 MED ORDER — TRASTUZUMAB-DKST CHEMO 150 MG IV SOLR
6.0000 mg/kg | Freq: Once | INTRAVENOUS | Status: AC
  Administered 2022-04-04: 399 mg via INTRAVENOUS
  Filled 2022-04-04: qty 19

## 2022-04-04 MED ORDER — SODIUM CHLORIDE 0.9 % IV SOLN
390.0000 mg | Freq: Once | INTRAVENOUS | Status: AC
  Administered 2022-04-04: 390 mg via INTRAVENOUS
  Filled 2022-04-04: qty 39

## 2022-04-04 MED ORDER — SODIUM CHLORIDE 0.9 % IV SOLN: Freq: Once | INTRAVENOUS | Status: AC

## 2022-04-04 MED ORDER — PALONOSETRON HCL INJECTION 0.25 MG/5ML
0.2500 mg | Freq: Once | INTRAVENOUS | Status: AC
  Administered 2022-04-04: 0.25 mg via INTRAVENOUS
  Filled 2022-04-04: qty 5

## 2022-04-04 MED ORDER — ACETAMINOPHEN 325 MG PO TABS
650.0000 mg | ORAL_TABLET | Freq: Once | ORAL | Status: AC
  Administered 2022-04-04: 650 mg via ORAL
  Filled 2022-04-04: qty 2

## 2022-04-04 MED ORDER — SODIUM CHLORIDE 0.9 % IV SOLN
131.2500 mg/m2 | Freq: Once | INTRAVENOUS | Status: AC
  Administered 2022-04-04: 228 mg via INTRAVENOUS
  Filled 2022-04-04: qty 38

## 2022-04-04 NOTE — Assessment & Plan Note (Signed)
She denies worsening peripheral neuropathy We will monitor closely

## 2022-04-04 NOTE — Assessment & Plan Note (Signed)
I have reviewed multiple CT imaging with the patient and her mother She has excellent response to treatment with greater than 50% reduction in tumor burden Overall, she tolerated treatment very well I recommend we continue treatment for 2 to 3 months before switching her to maintenance chemotherapy She is in agreement to proceed

## 2022-04-04 NOTE — Progress Notes (Signed)
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[facility]) I have reviewed multiple CT imaging with the patient and her mother She has excellent response to treatment with greater than 50% reduction in tumor burden Overall, she tolerated treatment very well I recommend we continue treatment for 2 to 3 months before switching her to maintenance chemotherapy She is in agreement to proceed  Metastasis to lung [facility]) The lung nodules are reduced in size We will continue close monitoring  Hydronephrosis of right kidney This is stable Her renal function is stable Observe closely  Peripheral neuropathy due to chemotherapy [facility]) She denies worsening peripheral neuropathy We will monitor closely  No orders of the defined types were placed in this encounter.   All questions were answered. The patient knows to call the clinic with any problems, questions or concerns. The total time spent in the appointment was 30 minutes encounter with patients including review of chart and various tests results, discussions about plan of care and coordination of care plan   Heath Lark, MD 04/04/2022 10:27 AM  INTERVAL HISTORY: Please see below for problem oriented charting. she returns for treatment follow-up prior to cycle 4 of chemotherapy and to review test results She is doing well She has no further pain and need to take morphine Denies nausea or constipation No worsening peripheral neuropathy  REVIEW OF SYSTEMS:   Constitutional: Denies fevers, chills or abnormal weight loss Eyes: Denies blurriness of vision Ears, nose, mouth, throat, and face: Denies mucositis or sore throat Respiratory: Denies cough, dyspnea or wheezes Cardiovascular: Denies palpitation,  chest discomfort or lower extremity swelling Gastrointestinal:  Denies nausea, heartburn or change in bowel habits Skin: Denies abnormal skin rashes Lymphatics: Denies new lymphadenopathy or easy bruising Neurological:Denies numbness, tingling or new weaknesses Behavioral/Psych: Mood is stable, no new changes  All other systems were reviewed with the patient and are negative.  I have reviewed the past medical history, past surgical history, social history and family history with the patient and they are unchanged from previous note.  ALLERGIES:  is allergic to statins and zetia [ezetimibe].  MEDICATIONS:  Current Outpatient Medications  Medication Sig Dispense Refill   acetaminophen (TYLENOL) 500 MG tablet Take 1,000 mg by mouth every 6 (six) hours as needed for mild pain or headache.     amLODipine (NORVASC) 5 MG tablet Take 1 tablet (5 mg total) by mouth daily. 30 tablet 0   dexamethasone (DECADRON) 4 MG tablet Take 2 tablets by mouth the night before and 2 tabs the morning of chemotherapy, every 3 weeks for 6 cycles 36 tablet 6   Evolocumab (REPATHA SURECLICK) 536 MG/ML SOAJ Inject 140 mg into the skin every 14 (fourteen) days. (Patient not taking: Reported on 01/28/2022) 2 pen 11   lidocaine-prilocaine (EMLA) cream Apply 1 application topically as needed. 30 g 0   loratadine (CLARITIN) 10 MG tablet Take 1 tablet (10 mg total) by mouth daily as needed for allergies. (Patient not taking: Reported on 01/28/2022) 30 tablet 11   metFORMIN (GLUCOPHAGE) 1000 MG tablet Take 1,000 mg by mouth 2 (two) times daily with a meal. (Patient not taking: Reported on 01/28/2022)     metoprolol tartrate (LOPRESSOR) 25 MG tablet Take 1 tablet (25 mg total) by mouth 2 (two) times daily. [place]  tablet 0   nitroGLYCERIN (NITROSTAT) 0.4 MG SL tablet Place 1 tablet (0.4 mg total) under the tongue every 5 (five) minutes as needed for chest pain. (Patient not taking: Reported on 01/28/2022) 30 tablet 2   ondansetron  (ZOFRAN) 8 MG tablet Take 1 tablet (8 mg total) by mouth every 8 (eight) hours as needed for nausea. 30 tablet 3   polyethylene glycol (MIRALAX) 17 g packet Take 17 g by mouth daily. 30 each 1   prochlorperazine (COMPAZINE) 10 MG tablet Take 1 tablet (10 mg total) by mouth every 6 (six) hours as needed for nausea or vomiting. 30 tablet 0   senna-docusate (SENOKOT-S) 8.6-50 MG tablet Take 2 tablets by mouth 2 (two) times daily. 90 tablet 1   No current facility-administered medications for this visit.   Facility-Administered Medications Ordered in Other Visits  Medication Dose Route Frequency Provider Last Rate Last Admin   CARBOplatin (PARAPLATIN) 390 mg in sodium chloride 0.9 % 250 mL chemo infusion  390 mg Intravenous Once Alvy Bimler, Patti Shorb, MD       dexamethasone (DECADRON) 10 mg in sodium chloride 0.9 % 50 mL IVPB  10 mg Intravenous Once Alvy Bimler, Jone Panebianco, MD       fosaprepitant (EMEND) 150 mg in sodium chloride 0.9 % 145 mL IVPB  150 mg Intravenous Once Alvy Bimler, Jenell Dobransky, MD       heparin lock flush 100 unit/mL  500 Units Intracatheter Once PRN Alvy Bimler, Janus Vlcek, MD       PACLitaxel (TAXOL) 228 mg in sodium chloride 0.9 % 250 mL chemo infusion (> 55m/m2)  131.25 mg/m2 (Treatment Plan Recorded) Intravenous Once GAlvy Bimler Elber Galyean, MD       sodium chloride flush (NS) 0.9 % injection 10 mL  10 mL Intracatheter PRN Omega Durante, MD       trastuzumab-dkst (OGIVRI) 399 mg in sodium chloride 0.9 % 250 mL chemo infusion  6 mg/kg (Treatment Plan Recorded) Intravenous Once GHeath Lark MD        SUMMARY OF ONCOLOGIC HISTORY: Oncology History Overview Note  High grade serous MSI stable, Her2/neu 3+, 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    12/14/2020 Imaging   CT chest abdomen and pelvis IMPRESSION:  1.  2 small nodules in the right lung, nonspecific.  2.  Prominent precarinal lymph node  versus adjacent small nodes.  3.  Enlarged heterogeneous uterine fundus.  4.  No definite adenopathy in the abdomen or pelvis. Nonspecific small lymph nodes are present, not enlarged by CT criteria.  5.  Mild wall thickening of the anterior aspect of the urinary bladder which may be due to to nondistention or inflammation.    12/27/2020 Pathology Results   SPECIMEN     Procedure:    Total hysterectomy and bilateral salpingo-oophorectomy   TUMOR     Histologic Type:    Serous carcinoma     Histologic Grade:    Not applicable     Myometrial Invasion:    Present       Depth of Myometrial Invasion:    11 mm       Myometrial Thickness:    12 mm       Percentage of Myometrial Invasion:    92 %     Uterine Serosa Involvement:    Not identified     Cervical Stromal Involvement:    Not identified  Other Tissue / Organ Involvement:    Not identified     Peritoneal / Ascitic Fluid:    Not identified     Lymphovascular Invasion (LVI):    Present   MARGINS     Margin Status:    Not applicable   REGIONAL LYMPH NODES     Regional Lymph Node Status:           :    All regional lymph nodes negative for tumor cells         Lymph Nodes Examined:               Total Number of Pelvic Nodes Examined:    3           Number of Pelvic Sentinel Nodes Examined:    3           Total Number of Para-aortic Nodes Examined:    0           Number of Para-aortic Sentinel Nodes Examined:    Not applicable   DISTANT METASTASIS     Distant Site(s) Involved:    Not applicable   PATHOLOGIC STAGE CLASSIFICATION (pTNM, AJCC 8th Edition)     Reporting of pT, pN, and (when applicable) pM categories is based on information available to the pathologist at the time the report is issued. As per the AJCC (Chapter 1, 8th Ed.) it is the managing physician's responsibility to establish the final pathologic stage based upon all pertinent information, including but potentially not limited to this pathology report.     TNM  Descriptors:    Not applicable     Tumor Modifier:    Not applicable     pT Category:    pT1b     Regional Lymph Nodes Modifier:    (sn)     pN Category:    pN0   The endometrial adenocarcinoma was analyzed by IHC for DNA mismatch repair proteins.  The neoplasm retained nuclear expression of all four mismatch repair proteins, MLH1, PMS2, MSH2, MSH6.    Estrogen receptors (ER): Negative Progesterone receptors (PR): Negative  Controls worked appropriately.   A HER2 stain is POSITIVE (3+).   12/27/2020 Surgery   At [facility], she underwent a robotic hysterectomy/BSO, sentinel node dissection and partial omentectomy. She was found to have an enlarged uterus. An enlarged right pelvic sentinel node.    02/23/2021 Echocardiogram   Left Ventricle: Systolic function is normal. EF: 55-60%.    Left Ventricle: Doppler parameters consistent with mild diastolic  dysfunction and low to normal LA pressure.    Left Ventricle: There is mild concentric hypertrophy.    Aortic Valve: Mild aortic valve regurgitation.   01/27/2022 Imaging   CT chest imaging 1. No pulmonary embolus. 2. Findings consistent with intrathoracic malignancy. Multiple pulmonary nodules throughout both lungs of varying sizes, consistent with metastatic disease. Primary site of malignancy may represent a 4 cm spiculated nodule in the right perihilar region spanning the fissure that is cavitating. Alternatively all of these nodules may be metastatic given history of cancer, type not specified.  3. Multifocal mediastinal and bilateral hilar adenopathy, consistent metastatic disease. 4. Nondisplaced left anterior seventh and eighth rib fractures, soft tissue thickening adjacent to the seventh rib fracture suggests this may be pathologic 5. Right hydronephrosis is partially included in the field of view in the upper abdomen. Small soft tissue density medial to the right hepatic lobe posterior to the right kidney is nonspecific but may represent  metastatic  disease. Recommend staging CT of the abdomen and pelvis with oral and IV contrast. 6. Moderate emphysema. 7. Aortic atherosclerosis.  Coronary artery calcifications.   01/28/2022 Initial Diagnosis   Endometrial cancer ([place])   01/28/2022 Cancer Staging   Staging form: Corpus Uteri - Carcinoma and Carcinosarcoma, AJCC 8th Edition - Clinical stage from 01/28/2022: FIGO Stage IVB (rcT1b, cN2a, cM1) - Signed by Heath Lark, MD on 01/28/2022 Stage prefix: Recurrence   01/28/2022 Imaging   CT abdomen and pelvis 1. Multiple abdominal and pelvic metastatic implants as above. 2. Moderate right hydronephrosis secondary to mass effect and obstruction of the distal right ureter by a right pelvic metastatic implant. 3. Multiple pulmonary metastatic disease in the visualized right lung base. 4. No bowel obstruction. Normal appendix. 5. Aortic Atherosclerosis (ICD10-I70.0).   01/30/2022 Echocardiogram    1. Left ventricular ejection fraction, by estimation, is 55 to 60%. The left ventricle has normal function. The left ventricle has no regional wall motion abnormalities. There is moderate concentric left ventricular  hypertrophy. Left ventricular diastolic parameters are consistent with Grade I diastolic dysfunction (impaired relaxation).   2. Right ventricular systolic function is normal. The right ventricular size is normal. There is normal pulmonary artery systolic pressure.   3. Left atrial size was mildly dilated.   4. The mitral valve is normal in structure. Trivial mitral valve regurgitation. No evidence of mitral stenosis.   5. The aortic valve is tricuspid. Aortic valve regurgitation is trivial. No aortic stenosis is present.   6. Aortic dilatation noted. There is mild dilatation of the aortic root, measuring 39 mm.   7. The inferior vena cava is normal in size with <50% respiratory variability, suggesting right atrial pressure of 8 mmHg.    01/31/2022 -  Chemotherapy   Patient is on  Treatment Plan : UTERINE SEROUS CARCINOMA Carboplatin + Paclitaxel + Trastuzumab q21d x 6 Cycles / Trastuzumab q21d     04/03/2022 Imaging   1. Pulmonary nodules are either reduced in size or completely resolved. No new nodules. 2. Marked reduction in size peritoneal nodular metastasis. Multiple nodular lesions are no longer measurable. 3. Interval reduction in size of implant in the RIGHT operator space. While this implant is smaller, lesion continues to obstruct the RIGHT ureter with persistent RIGHT hydronephrosis and hydroureter. 4. No new metastatic lesions are present.     PHYSICAL EXAMINATION: ECOG PERFORMANCE STATUS: 1 - Symptomatic but completely ambulatory  Vitals:   04/04/22 0905  BP: (!) 141/78  Pulse: 63  Resp: 18  Temp: 97.8 F (36.6 C)  SpO2: 97%   Filed Weights   04/04/22 0905  Weight: 150 lb 12.8 oz (68.4 kg)    GENERAL:alert, no distress and comfortable  NEURO: alert & oriented x 3 with fluent speech, no focal motor/sensory deficits  LABORATORY DATA:  I have reviewed the data as listed    Component Value Date/Time   NA 137 04/03/2022 1223   NA 141 02/25/2019 1222   K 3.9 04/03/2022 1223   CL 102 04/03/2022 1223   CO2 31 04/03/2022 1223   GLUCOSE 139 (H) 04/03/2022 1223   BUN 18 04/03/2022 1223   BUN 17 02/25/2019 1222   CREATININE 1.26 (H) 04/03/2022 1223   CALCIUM 9.1 04/03/2022 1223   PROT 7.4 04/03/2022 1223   PROT 6.9 09/22/2019 1419   ALBUMIN 3.8 04/03/2022 1223   ALBUMIN 3.9 09/22/2019 1419   AST 13 (L) 04/03/2022 1223   ALT 10 04/03/2022 1223   ALKPHOS 58 04/03/2022  1223   BILITOT 0.3 04/03/2022 1223   GFRNONAA 49 (L) 04/03/2022 1223   GFRAA >60 05/16/2019 0335    No results found for: "SPEP", "UPEP"  Lab Results  Component Value Date   WBC 7.8 04/03/2022   NEUTROABS 3.6 04/03/2022   HGB 10.8 (L) 04/03/2022   HCT 32.4 (L) 04/03/2022   MCV 82.2 04/03/2022   PLT 261 04/03/2022      Chemistry      Component Value Date/Time    NA 137 04/03/2022 1223   NA 141 02/25/2019 1222   K 3.9 04/03/2022 1223   CL 102 04/03/2022 1223   CO2 31 04/03/2022 1223   BUN 18 04/03/2022 1223   BUN 17 02/25/2019 1222   CREATININE 1.26 (H) 04/03/2022 1223      Component Value Date/Time   CALCIUM 9.1 04/03/2022 1223   ALKPHOS 58 04/03/2022 1223   AST 13 (L) 04/03/2022 1223   ALT 10 04/03/2022 1223   BILITOT 0.3 04/03/2022 1223       RADIOGRAPHIC STUDIES: I have reviewed imaging study with the patient and her mother I have personally reviewed the radiological images as listed and agreed with the findings in the report. CT ABDOMEN PELVIS W CONTRAST  Result Date: 04/03/2022 CLINICAL DATA:  Uterine carcinoma. Assess treatment response. Endometrial cancer. * Tracking Code: BO * EXAM: CT ABDOMEN AND PELVIS WITH CONTRAST TECHNIQUE: Multidetector CT imaging of the abdomen and pelvis was performed using the standard protocol following bolus administration of intravenous contrast. RADIATION DOSE REDUCTION: This exam was performed according to the departmental dose-optimization program which includes automated exposure control, adjustment of the mA and/or kV according to patient size and/or use of iterative reconstruction technique. CONTRAST:  135m OMNIPAQUE IOHEXOL 300 MG/ML  SOLN COMPARISON:  CT 01/28/2022 FINDINGS: Lower chest: Lung nodules decreased in size. Example nodule in the medial RIGHT lower lobe measures 5 mm (image 24/4) compared to 13 mm. Nodule in the RIGHT lower lobe along the oblique fissure measures 5 mm (image 4/series 4) could decreased from 8 mm. Nodule is much less dense. Other nodules in the RIGHT lower lobe completely resolved. No new nodularity. Hepatobiliary: No enhancing hepatic lesion. Pancreas: Normal pancreas Spleen: Normal spleen Adrenals/Urinary Tract: Adrenal glands normal. Persistent hydronephrosis and hydroureter on the RIGHT. The degree of hydronephrosis is similar prior. Probable obstructing mass along the distal  RIGHT ureter at the level of the RIGHT operator space. obstructing lesion is decreased in size measuring 2.9 x 2.0 cm (image 57/2) compared to 4.4 x 3.5 cm. LEFT kidney normal.  No obstruction of the ureter.  Bladder normal. Stomach/Bowel: Stomach, small bowel, appendix, and cecum are normal. The colon and rectosigmoid colon are normal. Vascular/Lymphatic: Abdominal aorta is normal caliber with atherosclerotic calcification. There is no retroperitoneal or periportal lymphadenopathy. No pelvic lymphadenopathy. Reproductive: Post hysterectomy.  Adnexa unremarkable Other: Interval reduction in previously seen peritoneal nodular metastasis. Lesion just above the LEFT iliac crest measures 18 mm (image 39/series 2) decreased from 43 mm. L Esion in the peritoneum above the splenic flexure measuring 6 mm (image 26/2) decreased from 15 mm. Lesion previously midline below the LEFT lateral lobe hepatic margin is no longer identifiable. Lesion in the RIGHT operator space obstructing the RIGHT ureter as described above is decreased. Lesion in the posterior cul-de-sac is no longer measurable. Additionally lesion seen in the RIGHT lateral abdominal wall musculature is no longer measurable. Musculoskeletal: No osseous lesion. IMPRESSION: 1. Pulmonary nodules are either reduced in size or completely resolved.  No new nodules. 2. Marked reduction in size peritoneal nodular metastasis. Multiple nodular lesions are no longer measurable. 3. Interval reduction in size of implant in the RIGHT operator space. While this implant is smaller, lesion continues to obstruct the RIGHT ureter with persistent RIGHT hydronephrosis and hydroureter. 4. No new metastatic lesions are present. Electronically Signed   By: Suzy Bouchard M.D.   On: 04/03/2022 16:17

## 2022-04-04 NOTE — Assessment & Plan Note (Signed)
This is stable Her renal function is stable Observe closely 

## 2022-04-04 NOTE — Assessment & Plan Note (Signed)
The lung nodules are reduced in size We will continue close monitoring

## 2022-04-04 NOTE — Progress Notes (Signed)
Per MD increase carbo dose to calc 390 mg

## 2022-04-04 NOTE — Patient Instructions (Signed)
[facility] ONCOL[facility]Y  Discharge Instructions: Thank you for choosing [facility] to provide your oncology and hematology care.   If you have a lab appointment with the [facility], please go directly to the [facility] and check in at the registration area.   Wear comfortable clothing and clothing appropriate for easy access to any Portacath or PICC line.   We strive to give you quality time with your provider. You may need to reschedule your appointment if you arrive late (15 or more minutes).  Arriving late affects you and other patients whose appointments are after yours.  Also, if you miss three or more appointments without notifying the office, you may be dismissed from the clinic at the provider's discretion.      For prescription refill requests, have your pharmacy contact our office and allow 72 hours for refills to be completed.    Today you received the following chemotherapy and/or immunotherapy agents: Trastuzumab (Ogivri), Paclitaxel (Taxol), and Carboplatin.   To help prevent nausea and vomiting after your treatment, we encourage you to take your nausea medication as directed.  BELOW ARE SYMPTOMS THAT SHOULD BE REPORTED IMMEDIATELY: *FEVER GREATER THAN 100.4 F (38 C) OR HIGHER *CHILLS OR SWEATING *NAUSEA AND VOMITING THAT IS NOT CONTROLLED WITH YOUR NAUSEA MEDICATION *UNUSUAL SHORTNESS OF BREATH *UNUSUAL BRUISING OR BLEEDING *URINARY PROBLEMS (pain or burning when urinating, or frequent urination) *BOWEL PROBLEMS (unusual diarrhea, constipation, pain near the anus) TENDERNESS IN MOUTH AND THROAT WITH OR WITHOUT PRESENCE OF ULCERS (sore throat, sores in mouth, or a toothache) UNUSUAL RASH, SWELLING OR PAIN  UNUSUAL VAGINAL DISCHARGE OR ITCHING   Items with * indicate a potential emergency and should be followed up as soon as possible or go to the Emergency Department if any problems should occur.  Please show the CHEMOTHERAPY ALERT  CARD or IMMUNOTHERAPY ALERT CARD at check-in to the Emergency Department and triage nurse.  Should you have questions after your visit or need to cancel or reschedule your appointment, please contact [facility]  Dept: [phone]  and follow the prompts.  Office hours are 8:00 a.m. to 4:30 p.m. Monday - Friday. Please note that voicemails left after 4:00 p.m. may not be returned until the following business day.  We are closed weekends and major holidays. You have access to a nurse at all times for urgent questions. Please call the main number to the clinic Dept: [phone] and follow the prompts.   For any non-urgent questions, you may also contact your provider using MyChart. We now offer e-Visits for anyone 75 and older to request care online for non-urgent symptoms. For details visit [email or web address].   Also download the MyChart app! Go to the app store, search "MyChart", open the app, select [facility], and log in with your MyChart username and password.  Masks are optional in the cancer centers. If you would like for your care team to wear a mask while they are taking care of you, please let them know. You may have one support person who is at least 59 years old accompany you for your appointments. Trastuzumab Injection What is this medication? TRASTUZUMAB (tras TOO zoo mab) treats breast cancer and stomach cancer. It works by blocking a protein that causes cancer cells to grow and multiply. This helps to slow or stop the spread of cancer cells. This medicine may be used for other purposes; ask your health care provider or pharmacist if you have  questions. COMMON BRAND NAME(S): Herceptin, Janae Bridgeman, Ontruzant, Trazimera What should I tell my care team before I take this medication? They need to know if you have any of these conditions: Heart failure Lung disease An unusual or allergic reaction to trastuzumab, other medications, foods,  dyes, or preservatives Pregnant or trying to get pregnant Breast-feeding How should I use this medication? This medication is injected into a vein. It is given by your care team in a hospital or clinic setting. Talk to your care team about the use of this medication in children. It is not approved for use in children. Overdosage: If you think you have taken too much of this medicine contact a poison control center or emergency room at once. NOTE: This medicine is only for you. Do not share this medicine with others. What if I miss a dose? Keep appointments for follow-up doses. It is important not to miss your dose. Call your care team if you are unable to keep an appointment. What may interact with this medication? Certain types of chemotherapy, such as daunorubicin, doxorubicin, epirubicin, idarubicin This list may not describe all possible interactions. Give your health care provider a list of all the medicines, herbs, non-prescription drugs, or dietary supplements you use. Also tell them if you smoke, drink alcohol, or use illegal drugs. Some items may interact with your medicine. What should I watch for while using this medication? Your condition will be monitored carefully while you are receiving this medication. This medication may make you feel generally unwell. This is not uncommon, as chemotherapy affects healthy cells as well as cancer cells. Report any side effects. Continue your course of treatment even though you feel ill unless your care team tells you to stop. This medication may increase your risk of getting an infection. Call your care team for advice if you get a fever, chills, sore throat, or other symptoms of a cold or flu. Do not treat yourself. Try to avoid being around people who are sick. Avoid taking medications that contain aspirin, acetaminophen, ibuprofen, naproxen, or ketoprofen unless instructed by your care team. These medications can hide a fever. Talk to your care  team if you may be pregnant. Serious birth defects can occur if you take this medication during pregnancy and for 7 months after the last dose. You will need a negative pregnancy test before starting this medication. Contraception is recommended while taking this medication and for 7 months after the last dose. Your care team can help you find the option that works for you. Do not breastfeed while taking this medication and for 7 months after stopping treatment. What side effects may I notice from receiving this medication? Side effects that you should report to your care team as soon as possible: Allergic reactions or angioedema--skin rash, itching or hives, swelling of the face, eyes, lips, tongue, arms, or legs, trouble swallowing or breathing Dry cough, shortness of breath or trouble breathing Heart failure--shortness of breath, swelling of the ankles, feet, or hands, sudden weight gain, unusual weakness or fatigue Infection--fever, chills, cough, or sore throat Infusion reactions--chest pain, shortness of breath or trouble breathing, feeling faint or lightheaded Side effects that usually do not require medical attention (report to your care team if they continue or are bothersome): Diarrhea Dizziness Headache Nausea Trouble sleeping Vomiting This list may not describe all possible side effects. Call your doctor for medical advice about side effects. You may report side effects to FDA at 1-800-FDA-1088. Where should I keep  my medication? This medication is given in a hospital or clinic. It will not be stored at home. NOTE: This sheet is a summary. It may not cover all possible information. If you have questions about this medicine, talk to your doctor, pharmacist, or health care provider.  2023 Elsevier/Gold Standard (2021-12-13 00:00:00) Paclitaxel Injection What is this medication? PACLITAXEL (PAK li TAX el) treats some types of cancer. It works by slowing down the growth of cancer  cells. This medicine may be used for other purposes; ask your health care provider or pharmacist if you have questions. COMMON BRAND NAME(S): Onxol, Taxol What should I tell my care team before I take this medication? They need to know if you have any of these conditions: Heart disease Liver disease Low white blood cell levels An unusual or allergic reaction to paclitaxel, other medications, foods, dyes, or preservatives If you or your partner are pregnant or trying to get pregnant Breast-feeding How should I use this medication? This medication is injected into a vein. It is given by your care team in a hospital or clinic setting. Talk to your care team about the use of this medication in children. While it may be given to children for selected conditions, precautions do apply. Overdosage: If you think you have taken too much of this medicine contact a poison control center or emergency room at once. NOTE: This medicine is only for you. Do not share this medicine with others. What if I miss a dose? Keep appointments for follow-up doses. It is important not to miss your dose. Call your care team if you are unable to keep an appointment. What may interact with this medication? Do not take this medication with any of the following: Live virus vaccines Other medications may affect the way this medication works. Talk with your care team about all of the medications you take. They may suggest changes to your treatment plan to lower the risk of side effects and to make sure your medications work as intended. This list may not describe all possible interactions. Give your health care provider a list of all the medicines, herbs, non-prescription drugs, or dietary supplements you use. Also tell them if you smoke, drink alcohol, or use illegal drugs. Some items may interact with your medicine. What should I watch for while using this medication? Your condition will be monitored carefully while you are  receiving this medication. You may need blood work while taking this medication. This medication may make you feel generally unwell. This is not uncommon as chemotherapy can affect healthy cells as well as cancer cells. Report any side effects. Continue your course of treatment even though you feel ill unless your care team tells you to stop. This medication can cause serious allergic reactions. To reduce the risk, your care team may give you other medications to take before receiving this one. Be sure to follow the directions from your care team. This medication may increase your risk of getting an infection. Call your care team for advice if you get a fever, chills, sore throat, or other symptoms of a cold or flu. Do not treat yourself. Try to avoid being around people who are sick. This medication may increase your risk to bruise or bleed. Call your care team if you notice any unusual bleeding. Be careful brushing or flossing your teeth or using a toothpick because you may get an infection or bleed more easily. If you have any dental work done, tell your dentist you are  receiving this medication. Talk to your care team if you may be pregnant. Serious birth defects can occur if you take this medication during pregnancy. Talk to your care team before breastfeeding. Changes to your treatment plan may be needed. What side effects may I notice from receiving this medication? Side effects that you should report to your care team as soon as possible: Allergic reactions--skin rash, itching, hives, swelling of the face, lips, tongue, or throat Heart rhythm changes--fast or irregular heartbeat, dizziness, feeling faint or lightheaded, chest pain, trouble breathing Increase in blood pressure Infection--fever, chills, cough, sore throat, wounds that don't heal, pain or trouble when passing urine, general feeling of discomfort or being unwell Low blood pressure--dizziness, feeling faint or lightheaded, blurry  vision Low red blood cell level--unusual weakness or fatigue, dizziness, headache, trouble breathing Painful swelling, warmth, or redness of the skin, blisters or sores at the infusion site Pain, tingling, or numbness in the hands or feet Slow heartbeat--dizziness, feeling faint or lightheaded, confusion, trouble breathing, unusual weakness or fatigue Unusual bruising or bleeding Side effects that usually do not require medical attention (report to your care team if they continue or are bothersome): Diarrhea Hair loss Joint pain Loss of appetite Muscle pain Nausea Vomiting This list may not describe all possible side effects. Call your doctor for medical advice about side effects. You may report side effects to FDA at 1-800-FDA-1088. Where should I keep my medication? This medication is given in a hospital or clinic. It will not be stored at home. NOTE: This sheet is a summary. It may not cover all possible information. If you have questions about this medicine, talk to your doctor, pharmacist, or health care provider.  2023 Elsevier/Gold Standard (2021-12-15 00:00:00) Carboplatin Injection What is this medication? CARBOPLATIN (KAR boe pla tin) treats some types of cancer. It works by slowing down the growth of cancer cells. This medicine may be used for other purposes; ask your health care provider or pharmacist if you have questions. COMMON BRAND NAME(S): Paraplatin What should I tell my care team before I take this medication? They need to know if you have any of these conditions: Blood disorders Hearing problems Kidney disease Recent or ongoing radiation therapy An unusual or allergic reaction to carboplatin, cisplatin, other medications, foods, dyes, or preservatives Pregnant or trying to get pregnant Breast-feeding How should I use this medication? This medication is injected into a vein. It is given by your care team in a hospital or clinic setting. Talk to your care team  about the use of this medication in children. Special care may be needed. Overdosage: If you think you have taken too much of this medicine contact a poison control center or emergency room at once. NOTE: This medicine is only for you. Do not share this medicine with others. What if I miss a dose? Keep appointments for follow-up doses. It is important not to miss your dose. Call your care team if you are unable to keep an appointment. What may interact with this medication? Medications for seizures Some antibiotics, such as amikacin, gentamicin, neomycin, streptomycin, tobramycin Vaccines This list may not describe all possible interactions. Give your health care provider a list of all the medicines, herbs, non-prescription drugs, or dietary supplements you use. Also tell them if you smoke, drink alcohol, or use illegal drugs. Some items may interact with your medicine. What should I watch for while using this medication? Your condition will be monitored carefully while you are receiving this medication. You  may need blood work while taking this medication. This medication may make you feel generally unwell. This is not uncommon, as chemotherapy can affect healthy cells as well as cancer cells. Report any side effects. Continue your course of treatment even though you feel ill unless your care team tells you to stop. In some cases, you may be given additional medications to help with side effects. Follow all directions for their use. This medication may increase your risk of getting an infection. Call your care team for advice if you get a fever, chills, sore throat, or other symptoms of a cold or flu. Do not treat yourself. Try to avoid being around people who are sick. Avoid taking medications that contain aspirin, acetaminophen, ibuprofen, naproxen, or ketoprofen unless instructed by your care team. These medications may hide a fever. Be careful brushing or flossing your teeth or using a toothpick  because you may get an infection or bleed more easily. If you have any dental work done, tell your dentist you are receiving this medication. Talk to your care team if you wish to become pregnant or think you might be pregnant. This medication can cause serious birth defects. Talk to your care team about effective forms of contraception. Do not breast-feed while taking this medication. What side effects may I notice from receiving this medication? Side effects that you should report to your care team as soon as possible: Allergic reactions--skin rash, itching, hives, swelling of the face, lips, tongue, or throat Infection--fever, chills, cough, sore throat, wounds that don't heal, pain or trouble when passing urine, general feeling of discomfort or being unwell Low red blood cell level--unusual weakness or fatigue, dizziness, headache, trouble breathing Pain, tingling, or numbness in the hands or feet, muscle weakness, change in vision, confusion or trouble speaking, loss of balance or coordination, trouble walking, seizures Unusual bruising or bleeding Side effects that usually do not require medical attention (report to your care team if they continue or are bothersome): Hair loss Nausea Unusual weakness or fatigue Vomiting This list may not describe all possible side effects. Call your doctor for medical advice about side effects. You may report side effects to FDA at 1-800-FDA-1088. Where should I keep my medication? This medication is given in a hospital or clinic. It will not be stored at home. NOTE: This sheet is a summary. It may not cover all possible information. If you have questions about this medicine, talk to your doctor, pharmacist, or health care provider.  2023 Elsevier/Gold Standard (2021-11-22 00:00:00)

## 2022-04-18 ENCOUNTER — Other Ambulatory Visit: Payer: Self-pay | Admitting: Hematology and Oncology

## 2022-04-18 DIAGNOSIS — C541 Malignant neoplasm of endometrium: Secondary | ICD-10-CM

## 2022-04-18 NOTE — Progress Notes (Signed)
ON [facility] - Uterine  No Change  Continue With Treatment as Ordered.  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 12 Months From Prior Therapy HER2 Status: Positive Intent of Therapy: Non-Curative / Palliative Intent, Discussed with Patient

## 2022-04-26 MED FILL — Dexamethasone Sodium Phosphate Inj 100 MG/10ML: INTRAMUSCULAR | Qty: 1 | Status: AC

## 2022-04-26 MED FILL — Fosaprepitant Dimeglumine For IV Infusion 150 MG (Base Eq): INTRAVENOUS | Qty: 5 | Status: AC

## 2022-04-27 ENCOUNTER — Other Ambulatory Visit: Payer: Self-pay

## 2022-04-27 ENCOUNTER — Inpatient Hospital Stay (HOSPITAL_BASED_OUTPATIENT_CLINIC_OR_DEPARTMENT_OTHER): Payer: Medicare Other | Admitting: Hematology and Oncology

## 2022-04-27 ENCOUNTER — Encounter: Payer: Self-pay | Admitting: Hematology and Oncology

## 2022-04-27 ENCOUNTER — Inpatient Hospital Stay: Payer: Medicare Other | Attending: Hematology and Oncology

## 2022-04-27 ENCOUNTER — Telehealth: Payer: Self-pay

## 2022-04-27 ENCOUNTER — Inpatient Hospital Stay: Payer: Medicare Other

## 2022-04-27 VITALS — BP 185/96 | HR 71 | Temp 98.3°F | Resp 18

## 2022-04-27 VITALS — BP 158/82 | HR 62 | Temp 97.8°F | Resp 18 | Ht 63.0 in | Wt 148.4 lb

## 2022-04-27 DIAGNOSIS — Z79899 Other long term (current) drug therapy: Secondary | ICD-10-CM | POA: Insufficient documentation

## 2022-04-27 DIAGNOSIS — C541 Malignant neoplasm of endometrium: Secondary | ICD-10-CM

## 2022-04-27 DIAGNOSIS — D6481 Anemia due to antineoplastic chemotherapy: Secondary | ICD-10-CM

## 2022-04-27 DIAGNOSIS — Z5111 Encounter for antineoplastic chemotherapy: Secondary | ICD-10-CM | POA: Insufficient documentation

## 2022-04-27 DIAGNOSIS — Z5112 Encounter for antineoplastic immunotherapy: Secondary | ICD-10-CM | POA: Diagnosis present

## 2022-04-27 DIAGNOSIS — I2510 Atherosclerotic heart disease of native coronary artery without angina pectoris: Secondary | ICD-10-CM

## 2022-04-27 DIAGNOSIS — N183 Chronic kidney disease, stage 3 unspecified: Secondary | ICD-10-CM

## 2022-04-27 DIAGNOSIS — G893 Neoplasm related pain (acute) (chronic): Secondary | ICD-10-CM

## 2022-04-27 DIAGNOSIS — T451X5A Adverse effect of antineoplastic and immunosuppressive drugs, initial encounter: Secondary | ICD-10-CM

## 2022-04-27 LAB — CBC WITH DIFFERENTIAL (CANCER CENTER ONLY)
Abs Immature Granulocytes: 0.01 10*3/uL (ref 0.00–0.07)
Basophils Absolute: 0 10*3/uL (ref 0.0–0.1)
Basophils Relative: 0 %
Eosinophils Absolute: 0 10*3/uL (ref 0.0–0.5)
Eosinophils Relative: 0 %
HCT: 32.9 % — ABNORMAL LOW (ref 36.0–46.0)
Hemoglobin: 10.8 g/dL — ABNORMAL LOW (ref 12.0–15.0)
Immature Granulocytes: 0 %
Lymphocytes Relative: 26 %
Lymphs Abs: 1.4 10*3/uL (ref 0.7–4.0)
MCH: 27.6 pg (ref 26.0–34.0)
MCHC: 32.8 g/dL (ref 30.0–36.0)
MCV: 84.1 fL (ref 80.0–100.0)
Monocytes Absolute: 0.2 10*3/uL (ref 0.1–1.0)
Monocytes Relative: 4 %
Neutro Abs: 3.9 10*3/uL (ref 1.7–7.7)
Neutrophils Relative %: 70 %
Platelet Count: 236 10*3/uL (ref 150–400)
RBC: 3.91 MIL/uL (ref 3.87–5.11)
RDW: 18 % — ABNORMAL HIGH (ref 11.5–15.5)
WBC Count: 5.5 10*3/uL (ref 4.0–10.5)
nRBC: 0 % (ref 0.0–0.2)

## 2022-04-27 LAB — CMP (CANCER CENTER ONLY)
ALT: 14 U/L (ref 0–44)
AST: 17 U/L (ref 15–41)
Albumin: 3.4 g/dL — ABNORMAL LOW (ref 3.5–5.0)
Alkaline Phosphatase: 57 U/L (ref 38–126)
Anion gap: 7 (ref 5–15)
BUN: 30 mg/dL — ABNORMAL HIGH (ref 6–20)
CO2: 24 mmol/L (ref 22–32)
Calcium: 8.9 mg/dL (ref 8.9–10.3)
Chloride: 106 mmol/L (ref 98–111)
Creatinine: 1.4 mg/dL — ABNORMAL HIGH (ref 0.44–1.00)
GFR, Estimated: 44 mL/min — ABNORMAL LOW (ref >60)
Glucose, Bld: 182 mg/dL — ABNORMAL HIGH (ref 70–99)
Potassium: 4.4 mmol/L (ref 3.5–5.1)
Sodium: 137 mmol/L (ref 135–145)
Total Bilirubin: 0.4 mg/dL (ref 0.3–1.2)
Total Protein: 8 g/dL (ref 6.5–8.1)

## 2022-04-27 MED ORDER — TRASTUZUMAB-DKST CHEMO 150 MG IV SOLR
6.0000 mg/kg | Freq: Once | INTRAVENOUS | Status: AC
  Administered 2022-04-27: 420 mg via INTRAVENOUS
  Filled 2022-04-27: qty 20

## 2022-04-27 MED ORDER — SODIUM CHLORIDE 0.9 % IV SOLN
131.2500 mg/m2 | Freq: Once | INTRAVENOUS | Status: AC
  Administered 2022-04-27: 228 mg via INTRAVENOUS
  Filled 2022-04-27: qty 38

## 2022-04-27 MED ORDER — FAMOTIDINE IN NACL 20-0.9 MG/50ML-% IV SOLN
20.0000 mg | Freq: Once | INTRAVENOUS | Status: AC
  Administered 2022-04-27: 20 mg via INTRAVENOUS
  Filled 2022-04-27: qty 50

## 2022-04-27 MED ORDER — SODIUM CHLORIDE 0.9 % IV SOLN
361.5000 mg | Freq: Once | INTRAVENOUS | Status: AC
  Administered 2022-04-27: 360 mg via INTRAVENOUS
  Filled 2022-04-27: qty 36

## 2022-04-27 MED ORDER — SODIUM CHLORIDE 0.9% FLUSH
10.0000 mL | INTRAVENOUS | Status: DC | PRN
  Administered 2022-04-27: 10 mL

## 2022-04-27 MED ORDER — SODIUM CHLORIDE 0.9% FLUSH
10.0000 mL | Freq: Once | INTRAVENOUS | Status: AC
  Administered 2022-04-27: 10 mL

## 2022-04-27 MED ORDER — SODIUM CHLORIDE 0.9 % IV SOLN
150.0000 mg | Freq: Once | INTRAVENOUS | Status: AC
  Administered 2022-04-27: 150 mg via INTRAVENOUS
  Filled 2022-04-27: qty 150

## 2022-04-27 MED ORDER — SODIUM CHLORIDE 0.9 % IV SOLN: Freq: Once | INTRAVENOUS | Status: AC

## 2022-04-27 MED ORDER — PALONOSETRON HCL INJECTION 0.25 MG/5ML
0.2500 mg | Freq: Once | INTRAVENOUS | Status: AC
  Administered 2022-04-27: 0.25 mg via INTRAVENOUS
  Filled 2022-04-27: qty 5

## 2022-04-27 MED ORDER — DIPHENHYDRAMINE HCL 50 MG/ML IJ SOLN
25.0000 mg | Freq: Once | INTRAMUSCULAR | Status: AC
  Administered 2022-04-27: 25 mg via INTRAVENOUS
  Filled 2022-04-27: qty 1

## 2022-04-27 MED ORDER — SODIUM CHLORIDE 0.9 % IV SOLN
10.0000 mg | Freq: Once | INTRAVENOUS | Status: AC
  Administered 2022-04-27: 10 mg via INTRAVENOUS
  Filled 2022-04-27: qty 10

## 2022-04-27 MED ORDER — ACETAMINOPHEN 325 MG PO TABS
650.0000 mg | ORAL_TABLET | Freq: Once | ORAL | Status: AC
  Administered 2022-04-27: 650 mg via ORAL
  Filled 2022-04-27: qty 2

## 2022-04-27 MED ORDER — HEPARIN SOD (PORK) LOCK FLUSH 100 UNIT/ML IV SOLN
500.0000 [IU] | Freq: Once | INTRAVENOUS | Status: AC | PRN
  Administered 2022-04-27: 500 [IU]

## 2022-04-27 NOTE — Assessment & Plan Note (Signed)

## 2022-04-27 NOTE — Telephone Encounter (Signed)
Called and given appt for ECHO on 9/25 at 9 am to [facility], arrive at [place]. She verbalized understanding.

## 2022-04-27 NOTE — Telephone Encounter (Signed)
-----   Message from Heath Lark, MD sent at 04/27/2022 10:42 AM EDT ----- Non urgent, please help her schedule echo to be done in 1-2 weeks, before next chemo in 3 weeks

## 2022-04-27 NOTE — Progress Notes (Signed)
[facility] OFFICE PROGRESS NOTE  Patient Care Team: Drue Flirt, MD as PCP - General (Family Medicine) Lorretta Harp, MD as PCP - Cardiology (Cardiology) Heath Lark, MD as Consulting Physician (Hematology and Oncology)  ASSESSMENT & PLAN:  Endometrial cancer [facility]) Her recent CT imaging showed excellent response to treatment with greater than 50% reduction in tumor burden Overall, she tolerated treatment very well I recommend we continue treatment for 2 to 3 months before switching her to maintenance chemotherapy I order echocardiogram before her next treatment while she is on Herceptin to monitor for cardiomyopathy  CKD (chronic kidney disease), stage III ([place]) She has intermittent acute on chronic renal failure We discussed importance of hydration and risk factor modification  Anemia due to antineoplastic chemotherapy This is likely due to recent treatment. The patient denies recent history of bleeding such as epistaxis, hematuria or hematochezia. She is asymptomatic from the anemia. I will observe for now.  She does not require transfusion now. I will continue the chemotherapy at current dose without dosage adjustment.  If the anemia gets progressive worse in the future, I might have to delay her treatment or adjust the chemotherapy dose.   Cancer related pain She has no pain I am excited of the prospect that the treatment is working She has prescription morphine sulfate to take as needed  Coronary artery disease She has coronary artery disease and risk factors for this We discussed importance of dietary modification and lifestyle changes I will order echocardiogram before her next cycle  Orders Placed This Encounter  Procedures   ECHOCARDIOGRAM COMPLETE    Standing Status:   Future    Standing Expiration Date:   04/28/2023    Order Specific Question:   Where should this test be performed    Answer:   [place]    Order Specific Question:    Perflutren DEFINITY (image enhancing agent) should be administered unless hypersensitivity or allergy exist    Answer:   Administer Perflutren    Order Specific Question:   Reason for exam-Echo    Answer:   Chemo  Z09    All questions were answered. The patient knows to call the clinic with any problems, questions or concerns. The total time spent in the appointment was 30 minutes encounter with patients including review of chart and various tests results, discussions about plan of care and coordination of care plan   Heath Lark, MD 04/27/2022 11:20 AM  INTERVAL HISTORY: Please see below for problem oriented charting. she returns for treatment follow-up seen prior to cycle 5 of treatment She denies worsening neuropathy She has not been checking her blood sugar or blood pressure lately No recent constipation She denies rib pain  REVIEW OF SYSTEMS:   Constitutional: Denies fevers, chills or abnormal weight loss Eyes: Denies blurriness of vision Ears, nose, mouth, throat, and face: Denies mucositis or sore throat Respiratory: Denies cough, dyspnea or wheezes Cardiovascular: Denies palpitation, chest discomfort or lower extremity swelling Gastrointestinal:  Denies nausea, heartburn or change in bowel habits Skin: Denies abnormal skin rashes Lymphatics: Denies new lymphadenopathy or easy bruising Neurological:Denies numbness, tingling or new weaknesses Behavioral/Psych: Mood is stable, no new changes  All other systems were reviewed with the patient and are negative.  I have reviewed the past medical history, past surgical history, social history and family history with the patient and they are unchanged from previous note.  ALLERGIES:  is allergic to statins and zetia [ezetimibe].  MEDICATIONS:  Current  Outpatient Medications  Medication Sig Dispense Refill   acetaminophen (TYLENOL) 500 MG tablet Take 1,000 mg by mouth every 6 (six) hours as needed for mild pain or headache.      amLODipine (NORVASC) 5 MG tablet Take 1 tablet (5 mg total) by mouth daily. 30 tablet 0   dexamethasone (DECADRON) 4 MG tablet Take 2 tablets by mouth the night before and 2 tabs the morning of chemotherapy, every 3 weeks for 6 cycles 36 tablet 6   Evolocumab (REPATHA SURECLICK) 496 MG/ML SOAJ Inject 140 mg into the skin every 14 (fourteen) days. (Patient not taking: Reported on 01/28/2022) 2 pen 11   lidocaine-prilocaine (EMLA) cream Apply 1 application topically as needed. 30 g 0   loratadine (CLARITIN) 10 MG tablet Take 1 tablet (10 mg total) by mouth daily as needed for allergies. (Patient not taking: Reported on 01/28/2022) 30 tablet 11   metFORMIN (GLUCOPHAGE) 1000 MG tablet Take 1,000 mg by mouth 2 (two) times daily with a meal. (Patient not taking: Reported on 01/28/2022)     metoprolol tartrate (LOPRESSOR) 25 MG tablet Take 1 tablet (25 mg total) by mouth 2 (two) times daily. 60 tablet 0   nitroGLYCERIN (NITROSTAT) 0.4 MG SL tablet Place 1 tablet (0.4 mg total) under the tongue every 5 (five) minutes as needed for chest pain. (Patient not taking: Reported on 01/28/2022) 30 tablet 2   ondansetron (ZOFRAN) 8 MG tablet Take 1 tablet (8 mg total) by mouth every 8 (eight) hours as needed for nausea. 30 tablet 3   polyethylene glycol (MIRALAX) 17 g packet Take 17 g by mouth daily. 30 each 1   prochlorperazine (COMPAZINE) 10 MG tablet Take 1 tablet (10 mg total) by mouth every 6 (six) hours as needed for nausea or vomiting. 30 tablet 0   senna-docusate (SENOKOT-S) 8.6-50 MG tablet Take 2 tablets by mouth 2 (two) times daily. 90 tablet 1   No current facility-administered medications for this visit.   Facility-Administered Medications Ordered in Other Visits  Medication Dose Route Frequency Provider Last Rate Last Admin   acetaminophen (TYLENOL) tablet 650 mg  650 mg Oral Once Alvy Bimler, Esequiel Kleinfelter, MD       CARBOplatin (PARAPLATIN) 360 mg in sodium chloride 0.9 % 100 mL chemo infusion  360 mg Intravenous Once  Alvy Bimler, Amerigo Mcglory, MD       dexamethasone (DECADRON) 10 mg in sodium chloride 0.9 % 50 mL IVPB  10 mg Intravenous Once Alvy Bimler, Lorianne Malbrough, MD       diphenhydrAMINE (BENADRYL) injection 25 mg  25 mg Intravenous Once Alvy Bimler, Zayin Valadez, MD       famotidine (PEPCID) IVPB 20 mg premix  20 mg Intravenous Once Alvy Bimler, Mychelle Kendra, MD       fosaprepitant (EMEND) 150 mg in sodium chloride 0.9 % 145 mL IVPB  150 mg Intravenous Once Alvy Bimler, Moriya Mitchell, MD 450 mL/hr at 04/27/22 1120 150 mg at 04/27/22 1120   heparin lock flush 100 unit/mL  500 Units Intracatheter Once PRN Heath Lark, MD       PACLitaxel (TAXOL) 228 mg in sodium chloride 0.9 % 250 mL chemo infusion (> 29m/m2)  131.25 mg/m2 (Treatment Plan Recorded) Intravenous Once GAlvy Bimler Tanaja Ganger, MD       palonosetron (ALOXI) injection 0.25 mg  0.25 mg Intravenous Once Jhostin Epps, MD       sodium chloride flush (NS) 0.9 % injection 10 mL  10 mL Intracatheter PRN GAlvy Bimler Hagan Vanauken, MD       trastuzumab-dkst (OGIVRI) 420 mg in  sodium chloride 0.9 % 250 mL chemo infusion  6 mg/kg (Treatment Plan Recorded) Intravenous Once Heath Lark, MD        SUMMARY OF ONCOLOGIC HISTORY: Oncology History Overview Note  High grade serous MSI stable, Her2/neu 3+, 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    12/14/2020 Imaging   CT chest abdomen and pelvis IMPRESSION:  1.  2 small nodules in the right lung, nonspecific.  2.  Prominent precarinal lymph node versus adjacent small nodes.  3.  Enlarged heterogeneous uterine fundus.  4.  No definite adenopathy in the abdomen or pelvis. Nonspecific small lymph nodes are present, not enlarged by CT criteria.  5.  Mild wall thickening of the anterior aspect of the urinary bladder which may be due to to nondistention or inflammation.    12/27/2020 Pathology Results   SPECIMEN     Procedure:    Total hysterectomy and bilateral  salpingo-oophorectomy   TUMOR     Histologic Type:    Serous carcinoma     Histologic Grade:    Not applicable     Myometrial Invasion:    Present       Depth of Myometrial Invasion:    11 mm       Myometrial Thickness:    12 mm       Percentage of Myometrial Invasion:    92 %     Uterine Serosa Involvement:    Not identified     Cervical Stromal Involvement:    Not identified     Other Tissue / Organ Involvement:    Not identified     Peritoneal / Ascitic Fluid:    Not identified     Lymphovascular Invasion (LVI):    Present   MARGINS     Margin Status:    Not applicable   REGIONAL LYMPH NODES     Regional Lymph Node Status:           :    All regional lymph nodes negative for tumor cells         Lymph Nodes Examined:               Total Number of Pelvic Nodes Examined:    3           Number of Pelvic Sentinel Nodes Examined:    3           Total Number of Para-aortic Nodes Examined:    0           Number of Para-aortic Sentinel Nodes Examined:    Not applicable   DISTANT METASTASIS     Distant Site(s) Involved:    Not applicable   PATHOLOGIC STAGE CLASSIFICATION (pTNM, AJCC 8th Edition)     Reporting of pT, pN, and (when applicable) pM categories is based on information available to the pathologist at the time the report is issued. As per the AJCC (Chapter 1, 8th Ed.) it is the managing physician's responsibility to establish the final pathologic stage based upon all pertinent information, including but potentially not limited to this pathology report.     TNM Descriptors:    Not applicable     Tumor Modifier:    Not applicable     pT Category:    pT1b     Regional Lymph Nodes Modifier:    (sn)  pN Category:    pN0   The endometrial adenocarcinoma was analyzed by [facility] for DNA mismatch repair proteins.  The neoplasm retained nuclear expression of all four mismatch repair proteins, MLH1, PMS2, MSH2, MSH6.    Estrogen receptors (ER): Negative Progesterone receptors (PR):  Negative  Controls worked appropriately.   A HER2 stain is POSITIVE (3+).   12/27/2020 Surgery   At [facility], she underwent a robotic hysterectomy/BSO, sentinel node dissection and partial omentectomy. She was found to have an enlarged uterus. An enlarged right pelvic sentinel node.    02/23/2021 Echocardiogram   Left Ventricle: Systolic function is normal. EF: 55-60%.    Left Ventricle: Doppler parameters consistent with mild diastolic  dysfunction and low to normal LA pressure.    Left Ventricle: There is mild concentric hypertrophy.    Aortic Valve: Mild aortic valve regurgitation.   01/27/2022 Imaging   CT chest imaging 1. No pulmonary embolus. 2. Findings consistent with intrathoracic malignancy. Multiple pulmonary nodules throughout both lungs of varying sizes, consistent with metastatic disease. Primary site of malignancy may represent a 4 cm spiculated nodule in the right perihilar region spanning the fissure that is cavitating. Alternatively all of these nodules may be metastatic given history of cancer, type not specified.  3. Multifocal mediastinal and bilateral hilar adenopathy, consistent metastatic disease. 4. Nondisplaced left anterior seventh and eighth rib fractures, soft tissue thickening adjacent to the seventh rib fracture suggests this may be pathologic 5. Right hydronephrosis is partially included in the field of view in the upper abdomen. Small soft tissue density medial to the right hepatic lobe posterior to the right kidney is nonspecific but may represent metastatic disease. Recommend staging CT of the abdomen and pelvis with oral and IV contrast. 6. Moderate emphysema. 7. Aortic atherosclerosis.  Coronary artery calcifications.   01/28/2022 Initial Diagnosis   Endometrial cancer ([place])   01/28/2022 Cancer Staging   Staging form: Corpus Uteri - Carcinoma and Carcinosarcoma, AJCC 8th Edition - Clinical stage from 01/28/2022: FIGO Stage IVB (rcT1b, cN2a, cM1) - Signed by  Heath Lark, MD on 01/28/2022 Stage prefix: Recurrence   01/28/2022 Imaging   CT abdomen and pelvis 1. Multiple abdominal and pelvic metastatic implants as above. 2. Moderate right hydronephrosis secondary to mass effect and obstruction of the distal right ureter by a right pelvic metastatic implant. 3. Multiple pulmonary metastatic disease in the visualized right lung base. 4. No bowel obstruction. Normal appendix. 5. Aortic Atherosclerosis (ICD10-I70.0).   01/30/2022 Echocardiogram    1. Left ventricular ejection fraction, by estimation, is 55 to 60%. The left ventricle has normal function. The left ventricle has no regional wall motion abnormalities. There is moderate concentric left ventricular  hypertrophy. Left ventricular diastolic parameters are consistent with Grade I diastolic dysfunction (impaired relaxation).   2. Right ventricular systolic function is normal. The right ventricular size is normal. There is normal pulmonary artery systolic pressure.   3. Left atrial size was mildly dilated.   4. The mitral valve is normal in structure. Trivial mitral valve regurgitation. No evidence of mitral stenosis.   5. The aortic valve is tricuspid. Aortic valve regurgitation is trivial. No aortic stenosis is present.   6. Aortic dilatation noted. There is mild dilatation of the aortic root, measuring 39 mm.   7. The inferior vena cava is normal in size with <50% respiratory variability, suggesting right atrial pressure of 8 mmHg.    01/31/2022 - 04/04/2022 Chemotherapy   Patient is on Treatment Plan : UTERINE SEROUS CARCINOMA  Carboplatin + Paclitaxel + Trastuzumab q21d x 6 Cycles / Trastuzumab q21d     01/31/2022 -  Chemotherapy   Patient is on Treatment Plan : UTERINE SEROUS CARCINOMA Carboplatin + Paclitaxel + Trastuzumab q21d x 6 Cycles / Trastuzumab q21d     04/03/2022 Imaging   1. Pulmonary nodules are either reduced in size or completely resolved. No new nodules. 2. Marked reduction in  size peritoneal nodular metastasis. Multiple nodular lesions are no longer measurable. 3. Interval reduction in size of implant in the RIGHT operator space. While this implant is smaller, lesion continues to obstruct the RIGHT ureter with persistent RIGHT hydronephrosis and hydroureter. 4. No new metastatic lesions are present.     PHYSICAL EXAMINATION: ECOG PERFORMANCE STATUS: 1 - Symptomatic but completely ambulatory  Vitals:   04/27/22 1040  BP: (!) 158/82  Pulse: 62  Resp: 18  Temp: 97.8 F (36.6 C)  SpO2: 100%   Filed Weights   04/27/22 1040  Weight: 148 lb 6.4 oz (67.3 kg)    GENERAL:alert, no distress and comfortable NEURO: alert & oriented x 3 with fluent speech, no focal motor/sensory deficits  LABORATORY DATA:  I have reviewed the data as listed    Component Value Date/Time   NA 137 04/27/2022 1019   NA 141 02/25/2019 1222   K 4.4 04/27/2022 1019   CL 106 04/27/2022 1019   CO2 24 04/27/2022 1019   GLUCOSE 182 (H) 04/27/2022 1019   BUN 30 (H) 04/27/2022 1019   BUN 17 02/25/2019 1222   CREATININE 1.40 (H) 04/27/2022 1019   CALCIUM 8.9 04/27/2022 1019   PROT 8.0 04/27/2022 1019   PROT 6.9 09/22/2019 1419   ALBUMIN 3.4 (L) 04/27/2022 1019   ALBUMIN 3.9 09/22/2019 1419   AST 17 04/27/2022 1019   ALT 14 04/27/2022 1019   ALKPHOS 57 04/27/2022 1019   BILITOT 0.4 04/27/2022 1019   GFRNONAA 44 (L) 04/27/2022 1019   GFRAA >60 05/16/2019 0335    No results found for: "SPEP", "UPEP"  Lab Results  Component Value Date   WBC 5.5 04/27/2022   NEUTROABS 3.9 04/27/2022   HGB 10.8 (L) 04/27/2022   HCT 32.9 (L) 04/27/2022   MCV 84.1 04/27/2022   PLT 236 04/27/2022      Chemistry      Component Value Date/Time   NA 137 04/27/2022 1019   NA 141 02/25/2019 1222   K 4.4 04/27/2022 1019   CL 106 04/27/2022 1019   CO2 24 04/27/2022 1019   BUN 30 (H) 04/27/2022 1019   BUN 17 02/25/2019 1222   CREATININE 1.40 (H) 04/27/2022 1019      Component Value  Date/Time   CALCIUM 8.9 04/27/2022 1019   ALKPHOS 57 04/27/2022 1019   AST 17 04/27/2022 1019   ALT 14 04/27/2022 1019   BILITOT 0.4 04/27/2022 1019       RADIOGRAPHIC STUDIES: I have personally reviewed the radiological images as listed and agreed with the findings in the report. CT ABDOMEN PELVIS W CONTRAST  Result Date: 04/03/2022 CLINICAL DATA:  Uterine carcinoma. Assess treatment response. Endometrial cancer. * Tracking Code: BO * EXAM: CT ABDOMEN AND PELVIS WITH CONTRAST TECHNIQUE: Multidetector CT imaging of the abdomen and pelvis was performed using the standard protocol following bolus administration of intravenous contrast. RADIATION DOSE REDUCTION: This exam was performed according to the departmental dose-optimization program which includes automated exposure control, adjustment of the mA and/or kV according to patient size and/or use of iterative reconstruction technique. CONTRAST:  166m OMNIPAQUE IOHEXOL 300 MG/ML  SOLN COMPARISON:  CT 01/28/2022 FINDINGS: Lower chest: Lung nodules decreased in size. Example nodule in the medial RIGHT lower lobe measures 5 mm (image 24/4) compared to 13 mm. Nodule in the RIGHT lower lobe along the oblique fissure measures 5 mm (image 4/series 4) could decreased from 8 mm. Nodule is much less dense. Other nodules in the RIGHT lower lobe completely resolved. No new nodularity. Hepatobiliary: No enhancing hepatic lesion. Pancreas: Normal pancreas Spleen: Normal spleen Adrenals/Urinary Tract: Adrenal glands normal. Persistent hydronephrosis and hydroureter on the RIGHT. The degree of hydronephrosis is similar prior. Probable obstructing mass along the distal RIGHT ureter at the level of the RIGHT operator space. obstructing lesion is decreased in size measuring 2.9 x 2.0 cm (image 57/2) compared to 4.4 x 3.5 cm. LEFT kidney normal.  No obstruction of the ureter.  Bladder normal. Stomach/Bowel: Stomach, small bowel, appendix, and cecum are normal. The colon  and rectosigmoid colon are normal. Vascular/Lymphatic: Abdominal aorta is normal caliber with atherosclerotic calcification. There is no retroperitoneal or periportal lymphadenopathy. No pelvic lymphadenopathy. Reproductive: Post hysterectomy.  Adnexa unremarkable Other: Interval reduction in previously seen peritoneal nodular metastasis. Lesion just above the LEFT iliac crest measures 18 mm (image 39/series 2) decreased from 43 mm. L Esion in the peritoneum above the splenic flexure measuring 6 mm (image 26/2) decreased from 15 mm. Lesion previously midline below the LEFT lateral lobe hepatic margin is no longer identifiable. Lesion in the RIGHT operator space obstructing the RIGHT ureter as described above is decreased. Lesion in the posterior cul-de-sac is no longer measurable. Additionally lesion seen in the RIGHT lateral abdominal wall musculature is no longer measurable. Musculoskeletal: No osseous lesion. IMPRESSION: 1. Pulmonary nodules are either reduced in size or completely resolved. No new nodules. 2. Marked reduction in size peritoneal nodular metastasis. Multiple nodular lesions are no longer measurable. 3. Interval reduction in size of implant in the RIGHT operator space. While this implant is smaller, lesion continues to obstruct the RIGHT ureter with persistent RIGHT hydronephrosis and hydroureter. 4. No new metastatic lesions are present. Electronically Signed   By: SSuzy BouchardM.D.   On: 04/03/2022 16:17

## 2022-04-27 NOTE — Assessment & Plan Note (Signed)
She has no pain I am excited of the prospect that the treatment is working She has prescription morphine sulfate to take as needed

## 2022-04-27 NOTE — Patient Instructions (Signed)
[facility]  Discharge Instructions: Thank you for choosing [facility] to provide your oncology and hematology care.   If you have a lab appointment with the [facility], please go directly to the [facility] and check in at the registration area.   Wear comfortable clothing and clothing appropriate for easy access to any Portacath or PICC line.   We strive to give you quality time with your provider. You may need to reschedule your appointment if you arrive late (15 or more minutes).  Arriving late affects you and other patients whose appointments are after yours.  Also, if you miss three or more appointments without notifying the office, you may be dismissed from the clinic at the provider's discretion.      For prescription refill requests, have your pharmacy contact our office and allow 72 hours for refills to be completed.    Today you received the following chemotherapy and/or immunotherapy agents: Trastuzumab (Ogivri), Paclitaxel (Taxol), and Carboplatin.   To help prevent nausea and vomiting after your treatment, we encourage you to take your nausea medication as directed.  BELOW ARE SYMPTOMS THAT SHOULD BE REPORTED IMMEDIATELY: *FEVER GREATER THAN 100.4 F (38 C) OR HIGHER *CHILLS OR SWEATING *NAUSEA AND VOMITING THAT IS NOT CONTROLLED WITH YOUR NAUSEA MEDICATION *UNUSUAL SHORTNESS OF BREATH *UNUSUAL BRUISING OR BLEEDING *URINARY PROBLEMS (pain or burning when urinating, or frequent urination) *BOWEL PROBLEMS (unusual diarrhea, constipation, pain near the anus) TENDERNESS IN MOUTH AND THROAT WITH OR WITHOUT PRESENCE OF ULCERS (sore throat, sores in mouth, or a toothache) UNUSUAL RASH, SWELLING OR PAIN  UNUSUAL VAGINAL DISCHARGE OR ITCHING   Items with * indicate a potential emergency and should be followed up as soon as possible or go to the Emergency Department if any problems should occur.  Please show the CHEMOTHERAPY ALERT  CARD or IMMUNOTHERAPY ALERT CARD at check-in to the Emergency Department and triage nurse.  Should you have questions after your visit or need to cancel or reschedule your appointment, please contact [facility]  Dept: [phone]  and follow the prompts.  Office hours are 8:00 a.m. to 4:30 p.m. Monday - Friday. Please note that voicemails left after 4:00 p.m. may not be returned until the following business day.  We are closed weekends and major holidays. You have access to a nurse at all times for urgent questions. Please call the main number to the clinic Dept: [phone] and follow the prompts.   For any non-urgent questions, you may also contact your provider using MyChart. We now offer e-Visits for anyone 23 and older to request care online for non-urgent symptoms. For details visit [email or web address].   Also download the MyChart app! Go to the app store, search "MyChart", open the app, select [facility], and log in with your MyChart username and password.  Masks are optional in the cancer centers. If you would like for your care team to wear a mask while they are taking care of you, please let them know. You may have one support person who is at least 59 years old accompany you for your appointments. Trastuzumab Injection What is this medication? TRASTUZUMAB (tras TOO zoo mab) treats breast cancer and stomach cancer. It works by blocking a protein that causes cancer cells to grow and multiply. This helps to slow or stop the spread of cancer cells. This medicine may be used for other purposes; ask your health care provider or pharmacist if you have  questions. COMMON BRAND NAME(S): Herceptin, Janae Bridgeman, Ontruzant, Trazimera What should I tell my care team before I take this medication? They need to know if you have any of these conditions: Heart failure Lung disease An unusual or allergic reaction to trastuzumab, other medications, foods,  dyes, or preservatives Pregnant or trying to get pregnant Breast-feeding How should I use this medication? This medication is injected into a vein. It is given by your care team in a hospital or clinic setting. Talk to your care team about the use of this medication in children. It is not approved for use in children. Overdosage: If you think you have taken too much of this medicine contact a poison control center or emergency room at once. NOTE: This medicine is only for you. Do not share this medicine with others. What if I miss a dose? Keep appointments for follow-up doses. It is important not to miss your dose. Call your care team if you are unable to keep an appointment. What may interact with this medication? Certain types of chemotherapy, such as daunorubicin, doxorubicin, epirubicin, idarubicin This list may not describe all possible interactions. Give your health care provider a list of all the medicines, herbs, non-prescription drugs, or dietary supplements you use. Also tell them if you smoke, drink alcohol, or use illegal drugs. Some items may interact with your medicine. What should I watch for while using this medication? Your condition will be monitored carefully while you are receiving this medication. This medication may make you feel generally unwell. This is not uncommon, as chemotherapy affects healthy cells as well as cancer cells. Report any side effects. Continue your course of treatment even though you feel ill unless your care team tells you to stop. This medication may increase your risk of getting an infection. Call your care team for advice if you get a fever, chills, sore throat, or other symptoms of a cold or flu. Do not treat yourself. Try to avoid being around people who are sick. Avoid taking medications that contain aspirin, acetaminophen, ibuprofen, naproxen, or ketoprofen unless instructed by your care team. These medications can hide a fever. Talk to your care  team if you may be pregnant. Serious birth defects can occur if you take this medication during pregnancy and for 7 months after the last dose. You will need a negative pregnancy test before starting this medication. Contraception is recommended while taking this medication and for 7 months after the last dose. Your care team can help you find the option that works for you. Do not breastfeed while taking this medication and for 7 months after stopping treatment. What side effects may I notice from receiving this medication? Side effects that you should report to your care team as soon as possible: Allergic reactions or angioedema--skin rash, itching or hives, swelling of the face, eyes, lips, tongue, arms, or legs, trouble swallowing or breathing Dry cough, shortness of breath or trouble breathing Heart failure--shortness of breath, swelling of the ankles, feet, or hands, sudden weight gain, unusual weakness or fatigue Infection--fever, chills, cough, or sore throat Infusion reactions--chest pain, shortness of breath or trouble breathing, feeling faint or lightheaded Side effects that usually do not require medical attention (report to your care team if they continue or are bothersome): Diarrhea Dizziness Headache Nausea Trouble sleeping Vomiting This list may not describe all possible side effects. Call your doctor for medical advice about side effects. You may report side effects to FDA at 1-800-FDA-1088. Where should I keep  my medication? This medication is given in a hospital or clinic. It will not be stored at home. NOTE: This sheet is a summary. It may not cover all possible information. If you have questions about this medicine, talk to your doctor, pharmacist, or health care provider.  2023 Elsevier/Gold Standard (2021-12-13 00:00:00) Paclitaxel Injection What is this medication? PACLITAXEL (PAK li TAX el) treats some types of cancer. It works by slowing down the growth of cancer  cells. This medicine may be used for other purposes; ask your health care provider or pharmacist if you have questions. COMMON BRAND NAME(S): Onxol, Taxol What should I tell my care team before I take this medication? They need to know if you have any of these conditions: Heart disease Liver disease Low white blood cell levels An unusual or allergic reaction to paclitaxel, other medications, foods, dyes, or preservatives If you or your partner are pregnant or trying to get pregnant Breast-feeding How should I use this medication? This medication is injected into a vein. It is given by your care team in a hospital or clinic setting. Talk to your care team about the use of this medication in children. While it may be given to children for selected conditions, precautions do apply. Overdosage: If you think you have taken too much of this medicine contact a poison control center or emergency room at once. NOTE: This medicine is only for you. Do not share this medicine with others. What if I miss a dose? Keep appointments for follow-up doses. It is important not to miss your dose. Call your care team if you are unable to keep an appointment. What may interact with this medication? Do not take this medication with any of the following: Live virus vaccines Other medications may affect the way this medication works. Talk with your care team about all of the medications you take. They may suggest changes to your treatment plan to lower the risk of side effects and to make sure your medications work as intended. This list may not describe all possible interactions. Give your health care provider a list of all the medicines, herbs, non-prescription drugs, or dietary supplements you use. Also tell them if you smoke, drink alcohol, or use illegal drugs. Some items may interact with your medicine. What should I watch for while using this medication? Your condition will be monitored carefully while you are  receiving this medication. You may need blood work while taking this medication. This medication may make you feel generally unwell. This is not uncommon as chemotherapy can affect healthy cells as well as cancer cells. Report any side effects. Continue your course of treatment even though you feel ill unless your care team tells you to stop. This medication can cause serious allergic reactions. To reduce the risk, your care team may give you other medications to take before receiving this one. Be sure to follow the directions from your care team. This medication may increase your risk of getting an infection. Call your care team for advice if you get a fever, chills, sore throat, or other symptoms of a cold or flu. Do not treat yourself. Try to avoid being around people who are sick. This medication may increase your risk to bruise or bleed. Call your care team if you notice any unusual bleeding. Be careful brushing or flossing your teeth or using a toothpick because you may get an infection or bleed more easily. If you have any dental work done, tell your dentist you are  receiving this medication. Talk to your care team if you may be pregnant. Serious birth defects can occur if you take this medication during pregnancy. Talk to your care team before breastfeeding. Changes to your treatment plan may be needed. What side effects may I notice from receiving this medication? Side effects that you should report to your care team as soon as possible: Allergic reactions--skin rash, itching, hives, swelling of the face, lips, tongue, or throat Heart rhythm changes--fast or irregular heartbeat, dizziness, feeling faint or lightheaded, chest pain, trouble breathing Increase in blood pressure Infection--fever, chills, cough, sore throat, wounds that don't heal, pain or trouble when passing urine, general feeling of discomfort or being unwell Low blood pressure--dizziness, feeling faint or lightheaded, blurry  vision Low red blood cell level--unusual weakness or fatigue, dizziness, headache, trouble breathing Painful swelling, warmth, or redness of the skin, blisters or sores at the infusion site Pain, tingling, or numbness in the hands or feet Slow heartbeat--dizziness, feeling faint or lightheaded, confusion, trouble breathing, unusual weakness or fatigue Unusual bruising or bleeding Side effects that usually do not require medical attention (report to your care team if they continue or are bothersome): Diarrhea Hair loss Joint pain Loss of appetite Muscle pain Nausea Vomiting This list may not describe all possible side effects. Call your doctor for medical advice about side effects. You may report side effects to FDA at 1-800-FDA-1088. Where should I keep my medication? This medication is given in a hospital or clinic. It will not be stored at home. NOTE: This sheet is a summary. It may not cover all possible information. If you have questions about this medicine, talk to your doctor, pharmacist, or health care provider.  2023 Elsevier/Gold Standard (2021-12-15 00:00:00) Carboplatin Injection What is this medication? CARBOPLATIN (KAR boe pla tin) treats some types of cancer. It works by slowing down the growth of cancer cells. This medicine may be used for other purposes; ask your health care provider or pharmacist if you have questions. COMMON BRAND NAME(S): Paraplatin What should I tell my care team before I take this medication? They need to know if you have any of these conditions: Blood disorders Hearing problems Kidney disease Recent or ongoing radiation therapy An unusual or allergic reaction to carboplatin, cisplatin, other medications, foods, dyes, or preservatives Pregnant or trying to get pregnant Breast-feeding How should I use this medication? This medication is injected into a vein. It is given by your care team in a hospital or clinic setting. Talk to your care team  about the use of this medication in children. Special care may be needed. Overdosage: If you think you have taken too much of this medicine contact a poison control center or emergency room at once. NOTE: This medicine is only for you. Do not share this medicine with others. What if I miss a dose? Keep appointments for follow-up doses. It is important not to miss your dose. Call your care team if you are unable to keep an appointment. What may interact with this medication? Medications for seizures Some antibiotics, such as amikacin, gentamicin, neomycin, streptomycin, tobramycin Vaccines This list may not describe all possible interactions. Give your health care provider a list of all the medicines, herbs, non-prescription drugs, or dietary supplements you use. Also tell them if you smoke, drink alcohol, or use illegal drugs. Some items may interact with your medicine. What should I watch for while using this medication? Your condition will be monitored carefully while you are receiving this medication. You  may need blood work while taking this medication. This medication may make you feel generally unwell. This is not uncommon, as chemotherapy can affect healthy cells as well as cancer cells. Report any side effects. Continue your course of treatment even though you feel ill unless your care team tells you to stop. In some cases, you may be given additional medications to help with side effects. Follow all directions for their use. This medication may increase your risk of getting an infection. Call your care team for advice if you get a fever, chills, sore throat, or other symptoms of a cold or flu. Do not treat yourself. Try to avoid being around people who are sick. Avoid taking medications that contain aspirin, acetaminophen, ibuprofen, naproxen, or ketoprofen unless instructed by your care team. These medications may hide a fever. Be careful brushing or flossing your teeth or using a toothpick  because you may get an infection or bleed more easily. If you have any dental work done, tell your dentist you are receiving this medication. Talk to your care team if you wish to become pregnant or think you might be pregnant. This medication can cause serious birth defects. Talk to your care team about effective forms of contraception. Do not breast-feed while taking this medication. What side effects may I notice from receiving this medication? Side effects that you should report to your care team as soon as possible: Allergic reactions--skin rash, itching, hives, swelling of the face, lips, tongue, or throat Infection--fever, chills, cough, sore throat, wounds that don't heal, pain or trouble when passing urine, general feeling of discomfort or being unwell Low red blood cell level--unusual weakness or fatigue, dizziness, headache, trouble breathing Pain, tingling, or numbness in the hands or feet, muscle weakness, change in vision, confusion or trouble speaking, loss of balance or coordination, trouble walking, seizures Unusual bruising or bleeding Side effects that usually do not require medical attention (report to your care team if they continue or are bothersome): Hair loss Nausea Unusual weakness or fatigue Vomiting This list may not describe all possible side effects. Call your doctor for medical advice about side effects. You may report side effects to FDA at 1-800-FDA-1088. Where should I keep my medication? This medication is given in a hospital or clinic. It will not be stored at home. NOTE: This sheet is a summary. It may not cover all possible information. If you have questions about this medicine, talk to your doctor, pharmacist, or health care provider.  2023 Elsevier/Gold Standard (2021-11-22 00:00:00)

## 2022-04-27 NOTE — Assessment & Plan Note (Signed)
Her recent CT imaging showed excellent response to treatment with greater than 50% reduction in tumor burden Overall, she tolerated treatment very well I recommend we continue treatment for 2 to 3 months before switching her to maintenance chemotherapy I order echocardiogram before her next treatment while she is on Herceptin to monitor for cardiomyopathy

## 2022-04-27 NOTE — Assessment & Plan Note (Signed)
She has coronary artery disease and risk factors for this We discussed importance of dietary modification and lifestyle changes I will order echocardiogram before her next cycle

## 2022-04-27 NOTE — Progress Notes (Signed)
Patient noted to have elevated BP 186/105  post taxol/carbo infusion. Patient states that she normally has elevated BP and takes BP medication in the morning. Krista [name], [place] assessed patient and educated patient on the need to monitor BP more frequently at home. Patient denied having any complaints. Patient verbalized understanding of education and the need to follow up if BP remains elevated, A/O x4, ambulatory at discharge.

## 2022-04-27 NOTE — Assessment & Plan Note (Signed)
She has intermittent acute on chronic renal failure We discussed importance of hydration and risk factor modification 

## 2022-05-08 ENCOUNTER — Ambulatory Visit (HOSPITAL_COMMUNITY)
Admission: RE | Admit: 2022-05-08 | Discharge: 2022-05-08 | Disposition: A | Discharge status: Home / Self Care | Payer: Medicare Other | Source: Ambulatory Visit | Attending: Hematology and Oncology | Admitting: Hematology and Oncology

## 2022-05-08 DIAGNOSIS — Z01818 Encounter for other preprocedural examination: Secondary | ICD-10-CM | POA: Diagnosis present

## 2022-05-08 DIAGNOSIS — C541 Malignant neoplasm of endometrium: Secondary | ICD-10-CM | POA: Diagnosis not present

## 2022-05-08 DIAGNOSIS — I119 Hypertensive heart disease without heart failure: Secondary | ICD-10-CM | POA: Diagnosis not present

## 2022-05-08 DIAGNOSIS — I351 Nonrheumatic aortic (valve) insufficiency: Secondary | ICD-10-CM | POA: Insufficient documentation

## 2022-05-08 DIAGNOSIS — E785 Hyperlipidemia, unspecified: Secondary | ICD-10-CM | POA: Diagnosis not present

## 2022-05-08 DIAGNOSIS — Z0189 Encounter for other specified special examinations: Secondary | ICD-10-CM

## 2022-05-08 DIAGNOSIS — E119 Type 2 diabetes mellitus without complications: Secondary | ICD-10-CM | POA: Diagnosis not present

## 2022-05-08 DIAGNOSIS — I2510 Atherosclerotic heart disease of native coronary artery without angina pectoris: Secondary | ICD-10-CM | POA: Insufficient documentation

## 2022-05-08 DIAGNOSIS — F17200 Nicotine dependence, unspecified, uncomplicated: Secondary | ICD-10-CM | POA: Diagnosis not present

## 2022-05-08 LAB — ECHOCARDIOGRAM COMPLETE
AR max vel: 2.75 cm2
AV Area VTI: 2.75 cm2
AV Area mean vel: 2.73 cm2
AV Mean grad: 2 mmHg
AV Peak grad: 3.4 mmHg
Ao pk vel: 0.92 m/s
Area-P 1/2: 5.54 cm2
Calc EF: 44.7 %
S' Lateral: 3.3 cm
Single Plane A2C EF: 41.4 %
Single Plane A4C EF: 48.6 %

## 2022-05-09 ENCOUNTER — Other Ambulatory Visit: Payer: Self-pay | Admitting: Hematology and Oncology

## 2022-05-09 ENCOUNTER — Telehealth: Payer: Self-pay

## 2022-05-09 DIAGNOSIS — I427 Cardiomyopathy due to drug and external agent: Secondary | ICD-10-CM

## 2022-05-09 DIAGNOSIS — I429 Cardiomyopathy, unspecified: Secondary | ICD-10-CM | POA: Insufficient documentation

## 2022-05-09 NOTE — Telephone Encounter (Signed)
Called and left below message. Ask her to call the office back with questions. 

## 2022-05-09 NOTE — Telephone Encounter (Signed)
-----   Message from Heath Lark, MD sent at 05/09/2022  8:24 AM EDT ----- Regarding: ECHO Her ECHO is worse I need to refer her back to cardiology, please tell her to expect a phone call from the dept

## 2022-05-17 MED FILL — Dexamethasone Sodium Phosphate Inj 100 MG/10ML: INTRAMUSCULAR | Qty: 1 | Status: AC

## 2022-05-17 MED FILL — Fosaprepitant Dimeglumine For IV Infusion 150 MG (Base Eq): INTRAVENOUS | Qty: 5 | Status: AC

## 2022-05-18 ENCOUNTER — Other Ambulatory Visit: Payer: Self-pay

## 2022-05-18 ENCOUNTER — Other Ambulatory Visit: Payer: Self-pay | Admitting: Hematology and Oncology

## 2022-05-18 ENCOUNTER — Inpatient Hospital Stay (HOSPITAL_BASED_OUTPATIENT_CLINIC_OR_DEPARTMENT_OTHER): Payer: Medicare Other | Admitting: Hematology and Oncology

## 2022-05-18 ENCOUNTER — Telehealth: Payer: Self-pay

## 2022-05-18 ENCOUNTER — Inpatient Hospital Stay: Payer: Medicare Other | Attending: Hematology and Oncology

## 2022-05-18 ENCOUNTER — Encounter: Payer: Self-pay | Admitting: Hematology and Oncology

## 2022-05-18 ENCOUNTER — Inpatient Hospital Stay: Payer: Medicare Other

## 2022-05-18 VITALS — BP 135/79 | HR 67 | Temp 97.8°F | Resp 18 | Ht 63.0 in | Wt 148.2 lb

## 2022-05-18 VITALS — BP 146/82 | HR 62

## 2022-05-18 DIAGNOSIS — C541 Malignant neoplasm of endometrium: Secondary | ICD-10-CM | POA: Insufficient documentation

## 2022-05-18 DIAGNOSIS — N183 Chronic kidney disease, stage 3 unspecified: Secondary | ICD-10-CM | POA: Diagnosis not present

## 2022-05-18 DIAGNOSIS — Z79899 Other long term (current) drug therapy: Secondary | ICD-10-CM | POA: Diagnosis not present

## 2022-05-18 DIAGNOSIS — Z5111 Encounter for antineoplastic chemotherapy: Secondary | ICD-10-CM | POA: Insufficient documentation

## 2022-05-18 DIAGNOSIS — I2510 Atherosclerotic heart disease of native coronary artery without angina pectoris: Secondary | ICD-10-CM

## 2022-05-18 DIAGNOSIS — E1165 Type 2 diabetes mellitus with hyperglycemia: Secondary | ICD-10-CM | POA: Insufficient documentation

## 2022-05-18 DIAGNOSIS — E1140 Type 2 diabetes mellitus with diabetic neuropathy, unspecified: Secondary | ICD-10-CM | POA: Diagnosis not present

## 2022-05-18 LAB — CBC WITH DIFFERENTIAL (CANCER CENTER ONLY)
Abs Immature Granulocytes: 0.03 10*3/uL (ref 0.00–0.07)
Basophils Absolute: 0 10*3/uL (ref 0.0–0.1)
Basophils Relative: 0 %
Eosinophils Absolute: 0 10*3/uL (ref 0.0–0.5)
Eosinophils Relative: 0 %
HCT: 30.5 % — ABNORMAL LOW (ref 36.0–46.0)
Hemoglobin: 10.2 g/dL — ABNORMAL LOW (ref 12.0–15.0)
Immature Granulocytes: 1 %
Lymphocytes Relative: 12 %
Lymphs Abs: 0.8 10*3/uL (ref 0.7–4.0)
MCH: 27.9 pg (ref 26.0–34.0)
MCHC: 33.4 g/dL (ref 30.0–36.0)
MCV: 83.6 fL (ref 80.0–100.0)
Monocytes Absolute: 0.1 10*3/uL (ref 0.1–1.0)
Monocytes Relative: 1 %
Neutro Abs: 5.7 10*3/uL (ref 1.7–7.7)
Neutrophils Relative %: 86 %
Platelet Count: 242 10*3/uL (ref 150–400)
RBC: 3.65 MIL/uL — ABNORMAL LOW (ref 3.87–5.11)
RDW: 18 % — ABNORMAL HIGH (ref 11.5–15.5)
WBC Count: 6.6 10*3/uL (ref 4.0–10.5)
nRBC: 0 % (ref 0.0–0.2)

## 2022-05-18 LAB — CMP (CANCER CENTER ONLY)
ALT: 16 U/L (ref 0–44)
AST: 21 U/L (ref 15–41)
Albumin: 3.7 g/dL (ref 3.5–5.0)
Alkaline Phosphatase: 59 U/L (ref 38–126)
Anion gap: 8 (ref 5–15)
BUN: 26 mg/dL — ABNORMAL HIGH (ref 6–20)
CO2: 25 mmol/L (ref 22–32)
Calcium: 8.7 mg/dL — ABNORMAL LOW (ref 8.9–10.3)
Chloride: 102 mmol/L (ref 98–111)
Creatinine: 1.56 mg/dL — ABNORMAL HIGH (ref 0.44–1.00)
GFR, Estimated: 38 mL/min — ABNORMAL LOW (ref >60)
Glucose, Bld: 422 mg/dL — ABNORMAL HIGH (ref 70–99)
Potassium: 4.4 mmol/L (ref 3.5–5.1)
Sodium: 135 mmol/L (ref 135–145)
Total Bilirubin: 0.3 mg/dL (ref 0.3–1.2)
Total Protein: 8.1 g/dL (ref 6.5–8.1)

## 2022-05-18 MED ORDER — SODIUM CHLORIDE 0.9 % IV SOLN
337.0000 mg | Freq: Once | INTRAVENOUS | Status: AC
  Administered 2022-05-18: 340 mg via INTRAVENOUS
  Filled 2022-05-18: qty 34

## 2022-05-18 MED ORDER — HEPARIN SOD (PORK) LOCK FLUSH 100 UNIT/ML IV SOLN
500.0000 [IU] | Freq: Once | INTRAVENOUS | Status: AC | PRN
  Administered 2022-05-18: 500 [IU]

## 2022-05-18 MED ORDER — PALONOSETRON HCL INJECTION 0.25 MG/5ML
0.2500 mg | Freq: Once | INTRAVENOUS | Status: AC
  Administered 2022-05-18: 0.25 mg via INTRAVENOUS
  Filled 2022-05-18: qty 5

## 2022-05-18 MED ORDER — FAMOTIDINE IN NACL 20-0.9 MG/50ML-% IV SOLN
20.0000 mg | Freq: Once | INTRAVENOUS | Status: AC
  Administered 2022-05-18: 20 mg via INTRAVENOUS
  Filled 2022-05-18: qty 50

## 2022-05-18 MED ORDER — ACETAMINOPHEN 325 MG PO TABS
650.0000 mg | ORAL_TABLET | Freq: Once | ORAL | Status: AC
  Administered 2022-05-18: 650 mg via ORAL
  Filled 2022-05-18: qty 2

## 2022-05-18 MED ORDER — SODIUM CHLORIDE 0.9 % IV SOLN
131.2500 mg/m2 | Freq: Once | INTRAVENOUS | Status: AC
  Administered 2022-05-18: 228 mg via INTRAVENOUS
  Filled 2022-05-18: qty 38

## 2022-05-18 MED ORDER — SODIUM CHLORIDE 0.9% FLUSH
10.0000 mL | INTRAVENOUS | Status: DC | PRN
  Administered 2022-05-18: 10 mL

## 2022-05-18 MED ORDER — INSULIN ASPART 100 UNIT/ML IJ SOLN
15.0000 [IU] | Freq: Once | INTRAMUSCULAR | Status: AC
  Administered 2022-05-18: 15 [IU] via SUBCUTANEOUS
  Filled 2022-05-18: qty 1

## 2022-05-18 MED ORDER — SODIUM CHLORIDE 0.9% FLUSH
10.0000 mL | Freq: Once | INTRAVENOUS | Status: AC
  Administered 2022-05-18: 10 mL

## 2022-05-18 MED ORDER — DIPHENHYDRAMINE HCL 50 MG/ML IJ SOLN
25.0000 mg | Freq: Once | INTRAMUSCULAR | Status: AC
  Administered 2022-05-18: 25 mg via INTRAVENOUS
  Filled 2022-05-18: qty 1

## 2022-05-18 MED ORDER — SODIUM CHLORIDE 0.9 % IV SOLN
150.0000 mg | Freq: Once | INTRAVENOUS | Status: AC
  Administered 2022-05-18: 150 mg via INTRAVENOUS
  Filled 2022-05-18: qty 150

## 2022-05-18 MED ORDER — SODIUM CHLORIDE 0.9 % IV SOLN: Freq: Once | INTRAVENOUS | Status: AC

## 2022-05-18 MED ORDER — SODIUM CHLORIDE 0.9 % IV SOLN
10.0000 mg | Freq: Once | INTRAVENOUS | Status: AC
  Administered 2022-05-18: 10 mg via INTRAVENOUS
  Filled 2022-05-18: qty 10

## 2022-05-18 NOTE — Patient Instructions (Signed)
[facility]  Discharge Instructions: Thank you for choosing [facility] to provide your oncology and hematology care.   If you have a lab appointment with the Cancer Center, please go directly to the Cancer Center and check in at the registration area.   Wear comfortable clothing and clothing appropriate for easy access to any Portacath or PICC line.   We strive to give you quality time with your provider. You may need to reschedule your appointment if you arrive late (15 or more minutes).  Arriving late affects you and other patients whose appointments are after yours.  Also, if you miss three or more appointments without notifying the office, you may be dismissed from the clinic at the provider's discretion.      For prescription refill requests, have your pharmacy contact our office and allow 72 hours for refills to be completed.    Today you received the following chemotherapy and/or immunotherapy agents: carboplatin, paclitaxel      To help prevent nausea and vomiting after your treatment, we encourage you to take your nausea medication as directed.  BELOW ARE SYMPTOMS THAT SHOULD BE REPORTED IMMEDIATELY: *FEVER GREATER THAN 100.4 F (38 C) OR HIGHER *CHILLS OR SWEATING *NAUSEA AND VOMITING THAT IS NOT CONTROLLED WITH YOUR NAUSEA MEDICATION *UNUSUAL SHORTNESS OF BREATH *UNUSUAL BRUISING OR BLEEDING *URINARY PROBLEMS (pain or burning when urinating, or frequent urination) *BOWEL PROBLEMS (unusual diarrhea, constipation, pain near the anus) TENDERNESS IN MOUTH AND THROAT WITH OR WITHOUT PRESENCE OF ULCERS (sore throat, sores in mouth, or a toothache) UNUSUAL RASH, SWELLING OR PAIN  UNUSUAL VAGINAL DISCHARGE OR ITCHING   Items with * indicate a potential emergency and should be followed up as soon as possible or go to the Emergency Department if any problems should occur.  Please show the CHEMOTHERAPY ALERT CARD or IMMUNOTHERAPY ALERT CARD  at check-in to the Emergency Department and triage nurse.  Should you have questions after your visit or need to cancel or reschedule your appointment, please contact [facility]  Dept: [phone]  and follow the prompts.  Office hours are 8:00 a.m. to 4:30 p.m. Monday - Friday. Please note that voicemails left after 4:00 p.m. may not be returned until the following business day.  We are closed weekends and major holidays. You have access to a nurse at all times for urgent questions. Please call the main number to the clinic Dept: [phone] and follow the prompts.   For any non-urgent questions, you may also contact your provider using MyChart. We now offer e-Visits for anyone 18 and older to request care online for non-urgent symptoms. For details visit [email or web address].   Also download the MyChart app! Go to the app store, search "MyChart", open the app, select [facility], and log in with your MyChart username and password.  Masks are optional in the cancer centers. If you would like for your care team to wear a mask while they are taking care of you, please let them know. You may have one support person who is at least 59 years old accompany you for your appointments. 

## 2022-05-18 NOTE — Assessment & Plan Note (Signed)
Her recent CT imaging show excellent response to treatment However, her recent echocardiogram showed slight reduced in ejection fraction I placed a consult a week ago for her to resume urgent care with her primary cardiologist for evaluation and management We will place trastuzumab on hold Depending on outcome of the consultation, she may or may not be able to start maintenance treatment in 3 weeks Thankfully, so far, she has no signs or symptoms of congestive heart failure

## 2022-05-18 NOTE — Telephone Encounter (Signed)
-----   Message from Heath Lark, MD sent at 05/18/2022  9:32 AM EDT ----- I sent internal referral for cardiology review 1 week ago Can you call?

## 2022-05-18 NOTE — Telephone Encounter (Signed)
Called [facility] regarding referral. Scheduled appt with Diona Browner, NP on 10/9 at [phone]. Given appt to [facility] with schedule.

## 2022-05-18 NOTE — Assessment & Plan Note (Signed)
She has recent changes in echocardiogram monitoring while on trastuzumab She will continue risk factor modification I have placed cardiology consult for her to be evaluated

## 2022-05-18 NOTE — Progress Notes (Signed)
[place] OFFICE PROGRESS NOTE  Patient Care Team: Drue Flirt, MD as PCP - General (Family Medicine) Lorretta Harp, MD as PCP - Cardiology (Cardiology) Heath Lark, MD as Consulting Physician (Hematology and Oncology)  ASSESSMENT & PLAN:  Endometrial cancer [facility]) Her recent CT imaging show excellent response to treatment However, her recent echocardiogram showed slight reduced in ejection fraction I placed a consult a week ago for her to resume urgent care with her primary cardiologist for evaluation and management We will place trastuzumab on hold Depending on outcome of the consultation, she may or may not be able to start maintenance treatment in 3 weeks Thankfully, so far, she has no signs or symptoms of congestive heart failure  Coronary artery disease She has recent changes in echocardiogram monitoring while on trastuzumab She will continue risk factor modification I have placed cardiology consult for her to be evaluated  CKD (chronic kidney disease), stage III ([place]) She has intermittent acute on chronic renal failure We discussed importance of hydration and risk factor modification I will reduce the dose of carboplatin today This mild exacerbation of chronic renal failure is due to uncontrolled hyperglycemia  Type 2 diabetes mellitus ([place]) She has uncontrolled hyperglycemia We will give her some insulin  No orders of the defined types were placed in this encounter.   All questions were answered. The patient knows to call the clinic with any problems, questions or concerns. The total time spent in the appointment was 40 minutes encounter with patients including review of chart and various tests results, discussions about plan of care and coordination of care plan   Heath Lark, MD 05/18/2022 10:52 AM  INTERVAL HISTORY: Please see below for problem oriented charting. she returns for treatment follow-up seen prior to cycle 6 of  treatment Cardiology appointment is still pending She denies worsening neuropathy No recent bone pain She has no clinical signs or symptoms of congestive heart failure, such as shortness of breath or chest pain or leg swelling  REVIEW OF SYSTEMS:   Constitutional: Denies fevers, chills or abnormal weight loss Eyes: Denies blurriness of vision Ears, nose, mouth, throat, and face: Denies mucositis or sore throat Respiratory: Denies cough, dyspnea or wheezes Cardiovascular: Denies palpitation, chest discomfort or lower extremity swelling Gastrointestinal:  Denies nausea, heartburn or change in bowel habits Skin: Denies abnormal skin rashes Lymphatics: Denies new lymphadenopathy or easy bruising Neurological:Denies numbness, tingling or new weaknesses Behavioral/Psych: Mood is stable, no new changes  All other systems were reviewed with the patient and are negative.  I have reviewed the past medical history, past surgical history, social history and family history with the patient and they are unchanged from previous note.  ALLERGIES:  is allergic to statins and zetia [ezetimibe].  MEDICATIONS:  Current Outpatient Medications  Medication Sig Dispense Refill   acetaminophen (TYLENOL) 500 MG tablet Take 1,000 mg by mouth every 6 (six) hours as needed for mild pain or headache.     amLODipine (NORVASC) 5 MG tablet Take 1 tablet (5 mg total) by mouth daily. 30 tablet 0   dexamethasone (DECADRON) 4 MG tablet Take 2 tablets by mouth the night before and 2 tabs the morning of chemotherapy, every 3 weeks for 6 cycles 36 tablet 6   Evolocumab (REPATHA SURECLICK) 616 MG/ML SOAJ Inject 140 mg into the skin every 14 (fourteen) days. (Patient not taking: Reported on 01/28/2022) 2 pen 11   lidocaine-prilocaine (EMLA) cream Apply 1 application topically as needed. 30 g 0  loratadine (CLARITIN) 10 MG tablet Take 1 tablet (10 mg total) by mouth daily as needed for allergies. (Patient not taking: Reported  on 01/28/2022) 30 tablet 11   metFORMIN (GLUCOPHAGE) 1000 MG tablet Take 1,000 mg by mouth 2 (two) times daily with a meal. (Patient not taking: Reported on 01/28/2022)     metoprolol tartrate (LOPRESSOR) 25 MG tablet Take 1 tablet (25 mg total) by mouth 2 (two) times daily. 60 tablet 0   nitroGLYCERIN (NITROSTAT) 0.4 MG SL tablet Place 1 tablet (0.4 mg total) under the tongue every 5 (five) minutes as needed for chest pain. (Patient not taking: Reported on 01/28/2022) 30 tablet 2   ondansetron (ZOFRAN) 8 MG tablet Take 1 tablet (8 mg total) by mouth every 8 (eight) hours as needed for nausea. 30 tablet 3   polyethylene glycol (MIRALAX) 17 g packet Take 17 g by mouth daily. 30 each 1   prochlorperazine (COMPAZINE) 10 MG tablet Take 1 tablet (10 mg total) by mouth every 6 (six) hours as needed for nausea or vomiting. 30 tablet 0   senna-docusate (SENOKOT-S) 8.6-50 MG tablet Take 2 tablets by mouth 2 (two) times daily. 90 tablet 1   No current facility-administered medications for this visit.   Facility-Administered Medications Ordered in Other Visits  Medication Dose Route Frequency Provider Last Rate Last Admin   CARBOplatin (PARAPLATIN) 340 mg in sodium chloride 0.9 % 100 mL chemo infusion  340 mg Intravenous Once Alvy Bimler, Natasa Stigall, MD       diphenhydrAMINE (BENADRYL) injection 25 mg  25 mg Intravenous Once Alvy Bimler, Shantasia Hunnell, MD       famotidine (PEPCID) IVPB 20 mg premix  20 mg Intravenous Once Alvy Bimler, Kareena Arrambide, MD 200 mL/hr at 05/18/22 1050 20 mg at 05/18/22 1050   heparin lock flush 100 unit/mL  500 Units Intracatheter Once PRN Heath Lark, MD       PACLitaxel (TAXOL) 228 mg in sodium chloride 0.9 % 250 mL chemo infusion (> 82m/m2)  131.25 mg/m2 (Treatment Plan Recorded) Intravenous Once GAlvy Bimler Britten Parady, MD       palonosetron (ALOXI) injection 0.25 mg  0.25 mg Intravenous Once Jaquise Faux, MD       sodium chloride flush (NS) 0.9 % injection 10 mL  10 mL Intracatheter PRN GHeath Lark MD        SUMMARY OF  ONCOLOGIC HISTORY: Oncology History Overview Note  High grade serous MSI stable, Her2/neu 3+, 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    12/14/2020 Imaging   CT chest abdomen and pelvis IMPRESSION:  1.  2 small nodules in the right lung, nonspecific.  2.  Prominent precarinal lymph node versus adjacent small nodes.  3.  Enlarged heterogeneous uterine fundus.  4.  No definite adenopathy in the abdomen or pelvis. Nonspecific small lymph nodes are present, not enlarged by CT criteria.  5.  Mild wall thickening of the anterior aspect of the urinary bladder which may be due to to nondistention or inflammation.    12/27/2020 Pathology Results   SPECIMEN     Procedure:    Total hysterectomy and bilateral salpingo-oophorectomy   TUMOR     Histologic Type:    Serous carcinoma     Histologic Grade:    Not applicable     Myometrial Invasion:    Present       Depth of Myometrial Invasion:  11 mm       Myometrial Thickness:    12 mm       Percentage of Myometrial Invasion:    92 %     Uterine Serosa Involvement:    Not identified     Cervical Stromal Involvement:    Not identified     Other Tissue / Organ Involvement:    Not identified     Peritoneal / Ascitic Fluid:    Not identified     Lymphovascular Invasion (LVI):    Present   MARGINS     Margin Status:    Not applicable   REGIONAL LYMPH NODES     Regional Lymph Node Status:           :    All regional lymph nodes negative for tumor cells         Lymph Nodes Examined:               Total Number of Pelvic Nodes Examined:    3           Number of Pelvic Sentinel Nodes Examined:    3           Total Number of Para-aortic Nodes Examined:    0           Number of Para-aortic Sentinel Nodes Examined:    Not applicable   DISTANT METASTASIS     Distant Site(s) Involved:    Not applicable   PATHOLOGIC  STAGE CLASSIFICATION (pTNM, AJCC 8th Edition)     Reporting of pT, pN, and (when applicable) pM categories is based on information available to the pathologist at the time the report is issued. As per the AJCC (Chapter 1, 8th Ed.) it is the managing physician's responsibility to establish the final pathologic stage based upon all pertinent information, including but potentially not limited to this pathology report.     TNM Descriptors:    Not applicable     Tumor Modifier:    Not applicable     pT Category:    pT1b     Regional Lymph Nodes Modifier:    (sn)     pN Category:    pN0   The endometrial adenocarcinoma was analyzed by IHC for DNA mismatch repair proteins.  The neoplasm retained nuclear expression of all four mismatch repair proteins, MLH1, PMS2, MSH2, MSH6.    Estrogen receptors (ER): Negative Progesterone receptors (PR): Negative  Controls worked appropriately.   A HER2 stain is POSITIVE (3+).   12/27/2020 Surgery   At [facility], she underwent a robotic hysterectomy/BSO, sentinel node dissection and partial omentectomy. She was found to have an enlarged uterus. An enlarged right pelvic sentinel node.    02/23/2021 Echocardiogram   Left Ventricle: Systolic function is normal. EF: 55-60%.    Left Ventricle: Doppler parameters consistent with mild diastolic  dysfunction and low to normal LA pressure.    Left Ventricle: There is mild concentric hypertrophy.    Aortic Valve: Mild aortic valve regurgitation.   01/27/2022 Imaging   CT chest imaging 1. No pulmonary embolus. 2. Findings consistent with intrathoracic malignancy. Multiple pulmonary nodules throughout both lungs of varying sizes, consistent with metastatic disease. Primary site of malignancy may represent a 4 cm spiculated nodule in the right perihilar region spanning the fissure that is cavitating. Alternatively all of these nodules may be metastatic given history of cancer, type not specified.  3. Multifocal mediastinal  and bilateral hilar adenopathy, consistent metastatic disease. 4.  Nondisplaced left anterior seventh and eighth rib fractures, soft tissue thickening adjacent to the seventh rib fracture suggests this may be pathologic 5. Right hydronephrosis is partially included in the field of view in the upper abdomen. Small soft tissue density medial to the right hepatic lobe posterior to the right kidney is nonspecific but may represent metastatic disease. Recommend staging CT of the abdomen and pelvis with oral and IV contrast. 6. Moderate emphysema. 7. Aortic atherosclerosis.  Coronary artery calcifications.   01/28/2022 Initial Diagnosis   Endometrial cancer ([place])   01/28/2022 Cancer Staging   Staging form: Corpus Uteri - Carcinoma and Carcinosarcoma, AJCC 8th Edition - Clinical stage from 01/28/2022: FIGO Stage IVB (rcT1b, cN2a, cM1) - Signed by Heath Lark, MD on 01/28/2022 Stage prefix: Recurrence   01/28/2022 Imaging   CT abdomen and pelvis 1. Multiple abdominal and pelvic metastatic implants as above. 2. Moderate right hydronephrosis secondary to mass effect and obstruction of the distal right ureter by a right pelvic metastatic implant. 3. Multiple pulmonary metastatic disease in the visualized right lung base. 4. No bowel obstruction. Normal appendix. 5. Aortic Atherosclerosis (ICD10-I70.0).   01/30/2022 Echocardiogram    1. Left ventricular ejection fraction, by estimation, is 55 to 60%. The left ventricle has normal function. The left ventricle has no regional wall motion abnormalities. There is moderate concentric left ventricular  hypertrophy. Left ventricular diastolic parameters are consistent with Grade I diastolic dysfunction (impaired relaxation).   2. Right ventricular systolic function is normal. The right ventricular size is normal. There is normal pulmonary artery systolic pressure.   3. Left atrial size was mildly dilated.   4. The mitral valve is normal in structure. Trivial mitral  valve regurgitation. No evidence of mitral stenosis.   5. The aortic valve is tricuspid. Aortic valve regurgitation is trivial. No aortic stenosis is present.   6. Aortic dilatation noted. There is mild dilatation of the aortic root, measuring 39 mm.   7. The inferior vena cava is normal in size with <50% respiratory variability, suggesting right atrial pressure of 8 mmHg.    01/31/2022 - 04/04/2022 Chemotherapy   Patient is on Treatment Plan : UTERINE SEROUS CARCINOMA Carboplatin + Paclitaxel + Trastuzumab q21d x 6 Cycles / Trastuzumab q21d     01/31/2022 -  Chemotherapy   Patient is on Treatment Plan : UTERINE SEROUS CARCINOMA Carboplatin + Paclitaxel + Trastuzumab q21d x 6 Cycles / Trastuzumab q21d     04/03/2022 Imaging   1. Pulmonary nodules are either reduced in size or completely resolved. No new nodules. 2. Marked reduction in size peritoneal nodular metastasis. Multiple nodular lesions are no longer measurable. 3. Interval reduction in size of implant in the RIGHT operator space. While this implant is smaller, lesion continues to obstruct the RIGHT ureter with persistent RIGHT hydronephrosis and hydroureter. 4. No new metastatic lesions are present.   05/09/2022 Echocardiogram    1. Left ventricular ejection fraction, by estimation, is 45 to 50%. The left ventricle has mildly decreased function. The left ventricle demonstrates global hypokinesis. There is moderate concentric left ventricular hypertrophy. Left ventricular diastolic parameters are consistent with Grade I diastolic dysfunction (impaired relaxation). The average left ventricular global longitudinal strain is -10.8 %. The global longitudinal strain is abnormal.  2. Right ventricular systolic function is normal. The right ventricular size is normal.  3. The mitral valve is normal in structure. No evidence of mitral valve regurgitation.  4. The aortic valve is tricuspid. Aortic valve regurgitation is mild.  5.  There is mild  dilatation of the ascending aorta, measuring 39 mm.  6. The inferior vena cava is normal in size with <50% respiratory variability, suggesting right atrial pressure of 8 mmHg.     PHYSICAL EXAMINATION: ECOG PERFORMANCE STATUS: 1 - Symptomatic but completely ambulatory  Vitals:   05/18/22 0932  BP: 135/79  Pulse: 67  Resp: 18  Temp: 97.8 F (36.6 C)  SpO2: 100%   Filed Weights   05/18/22 0932  Weight: 148 lb 3.2 oz (67.2 kg)    GENERAL:alert, no distress and comfortable NEURO: alert & oriented x 3 with fluent speech, no focal motor/sensory deficits  LABORATORY DATA:  I have reviewed the data as listed    Component Value Date/Time   NA 135 05/18/2022 0910   NA 141 02/25/2019 1222   K 4.4 05/18/2022 0910   CL 102 05/18/2022 0910   CO2 25 05/18/2022 0910   GLUCOSE 422 (H) 05/18/2022 0910   BUN 26 (H) 05/18/2022 0910   BUN 17 02/25/2019 1222   CREATININE 1.56 (H) 05/18/2022 0910   CALCIUM 8.7 (L) 05/18/2022 0910   PROT 8.1 05/18/2022 0910   PROT 6.9 09/22/2019 1419   ALBUMIN 3.7 05/18/2022 0910   ALBUMIN 3.9 09/22/2019 1419   AST 21 05/18/2022 0910   ALT 16 05/18/2022 0910   ALKPHOS 59 05/18/2022 0910   BILITOT 0.3 05/18/2022 0910   GFRNONAA 38 (L) 05/18/2022 0910   GFRAA >60 05/16/2019 0335    No results found for: "SPEP", "UPEP"  Lab Results  Component Value Date   WBC 6.6 05/18/2022   NEUTROABS 5.7 05/18/2022   HGB 10.2 (L) 05/18/2022   HCT 30.5 (L) 05/18/2022   MCV 83.6 05/18/2022   PLT 242 05/18/2022      Chemistry      Component Value Date/Time   NA 135 05/18/2022 0910   NA 141 02/25/2019 1222   K 4.4 05/18/2022 0910   CL 102 05/18/2022 0910   CO2 25 05/18/2022 0910   BUN 26 (H) 05/18/2022 0910   BUN 17 02/25/2019 1222   CREATININE 1.56 (H) 05/18/2022 0910      Component Value Date/Time   CALCIUM 8.7 (L) 05/18/2022 0910   ALKPHOS 59 05/18/2022 0910   AST 21 05/18/2022 0910   ALT 16 05/18/2022 0910   BILITOT 0.3 05/18/2022 0910        RADIOGRAPHIC STUDIES: I have personally reviewed the radiological images as listed and agreed with the findings in the report. ECHOCARDIOGRAM COMPLETE  Result Date: 05/08/2022    ECHOCARDIOGRAM REPORT   Patient Name:   Krista [name] Date of Exam: 05/08/2022 Medical Rec #:  [id number]        Height:       63.0 in Accession #:    [id number]       Weight:       148.4 lb Date of Birth:  06/01/1961       BSA:          1.703 m Patient Age:    61 years         BP:           185/96 mmHg Patient Gender: F                HR:           79 bpm. Exam Location:  Outpatient Procedure: Cardiac Doppler and Color Doppler Indications:    Chemo  History:  Patient has prior history of Echocardiogram examinations, most                 recent 01/29/2022. CAD; Risk Factors:Hypertension, Current                 Smoker, Dyslipidemia and Diabetes.  Sonographer:    Memory Argue Referring Phys: [id number] NI [place]  1. Left ventricular ejection fraction, by estimation, is 45 to 50%. The left ventricle has mildly decreased function. The left ventricle demonstrates global hypokinesis. There is moderate concentric left ventricular hypertrophy. Left ventricular diastolic parameters are consistent with Grade I diastolic dysfunction (impaired relaxation). The average left ventricular global longitudinal strain is -10.8 %. The global longitudinal strain is abnormal.  2. Right ventricular systolic function is normal. The right ventricular size is normal.  3. The mitral valve is normal in structure. No evidence of mitral valve regurgitation.  4. The aortic valve is tricuspid. Aortic valve regurgitation is mild.  5. There is mild dilatation of the ascending aorta, measuring 39 mm.  6. The inferior vena cava is normal in size with <50% respiratory variability, suggesting right atrial pressure of 8 mmHg. Comparison(s): Prior images reviewed side by side. The left ventricular function is worsened. FINDINGS  Left Ventricle:  Left ventricular ejection fraction, by estimation, is 45 to 50%. The left ventricle has mildly decreased function. The left ventricle demonstrates global hypokinesis. The average left ventricular global longitudinal strain is -10.8 %. The global longitudinal strain is abnormal. The left ventricular internal cavity size was normal in size. There is moderate concentric left ventricular hypertrophy. Left ventricular diastolic parameters are consistent with Grade I diastolic dysfunction (impaired relaxation). Indeterminate filling pressures. Right Ventricle: The right ventricular size is normal. No increase in right ventricular wall thickness. Right ventricular systolic function is normal. Left Atrium: Left atrial size was normal in size. Right Atrium: Right atrial size was normal in size. Pericardium: There is no evidence of pericardial effusion. Mitral Valve: The mitral valve is normal in structure. No evidence of mitral valve regurgitation. Tricuspid Valve: The tricuspid valve is normal in structure. Tricuspid valve regurgitation is trivial. Aortic Valve: The aortic valve is tricuspid. Aortic valve regurgitation is mild. Aortic valve mean gradient measures 2.0 mmHg. Aortic valve peak gradient measures 3.4 mmHg. Aortic valve area, by VTI measures 2.75 cm. Pulmonic Valve: The pulmonic valve was normal in structure. Pulmonic valve regurgitation is not visualized. Aorta: The aortic root is normal in size and structure. There is mild dilatation of the ascending aorta, measuring 39 mm. Venous: The inferior vena cava is normal in size with less than 50% respiratory variability, suggesting right atrial pressure of 8 mmHg. IAS/Shunts: No atrial level shunt detected by color flow Doppler.  LEFT VENTRICLE PLAX 2D LVIDd:         4.30 cm      Diastology LVIDs:         3.30 cm      LV e' medial:    5.22 cm/s LV PW:         1.40 cm      LV E/e' medial:  10.9 LV IVS:        1.80 cm      LV e' lateral:   6.09 cm/s LVOT diam:      2.10 cm      LV E/e' lateral: 9.3 LV SV:         46 LV SV Index:   27  2D Longitudinal Strain LVOT Area:     3.46 cm     2D Strain GLS Avg:     -10.8 %  LV Volumes (MOD) LV vol d, MOD A2C: 119.0 ml LV vol d, MOD A4C: 99.3 ml LV vol s, MOD A2C: 69.7 ml LV vol s, MOD A4C: 51.0 ml LV SV MOD A2C:     49.3 ml LV SV MOD A4C:     99.3 ml LV SV MOD BP:      49.4 ml RIGHT VENTRICLE RV S prime:     11.10 cm/s TAPSE (M-mode): 1.9 cm LEFT ATRIUM             Index        RIGHT ATRIUM           Index LA diam:        3.20 cm 1.88 cm/m   RA Area:     13.40 cm LA Vol (A2C):   44.5 ml 26.13 ml/m  RA Volume:   27.00 ml  15.85 ml/m LA Vol (A4C):   35.5 ml 20.84 ml/m LA Biplane Vol: 39.9 ml 23.42 ml/m  AORTIC VALVE AV Area (Vmax):    2.75 cm AV Area (Vmean):   2.73 cm AV Area (VTI):     2.75 cm AV Vmax:           92.30 cm/s AV Vmean:          61.100 cm/s AV VTI:            0.169 m AV Peak Grad:      3.4 mmHg AV Mean Grad:      2.0 mmHg LVOT Vmax:         73.30 cm/s LVOT Vmean:        48.100 cm/s LVOT VTI:          0.134 m LVOT/AV VTI ratio: 0.79  AORTA Ao Root diam: 3.64 cm MITRAL VALVE MV Area (PHT): 5.54 cm    SHUNTS MV Decel Time: 137 msec    Systemic VTI:  0.13 m MV E velocity: 56.90 cm/s  Systemic Diam: 2.10 cm MV A velocity: 96.30 cm/s MV E/A ratio:  0.59 Mihai Croitoru MD Electronically signed by Sanda Klein MD Signature Date/Time: 05/08/2022/2:12:41 PM    Final

## 2022-05-18 NOTE — Assessment & Plan Note (Signed)
She has uncontrolled hyperglycemia We will give her some insulin

## 2022-05-18 NOTE — Assessment & Plan Note (Signed)
She has intermittent acute on chronic renal failure We discussed importance of hydration and risk factor modification I will reduce the dose of carboplatin today This mild exacerbation of chronic renal failure is due to uncontrolled hyperglycemia

## 2022-05-22 ENCOUNTER — Other Ambulatory Visit: Payer: Self-pay | Admitting: Hematology and Oncology

## 2022-05-22 ENCOUNTER — Encounter: Payer: Self-pay | Admitting: Nurse Practitioner

## 2022-05-22 ENCOUNTER — Telehealth: Payer: Self-pay

## 2022-05-22 ENCOUNTER — Ambulatory Visit: Payer: Medicare Other | Attending: Nurse Practitioner | Admitting: Nurse Practitioner

## 2022-05-22 VITALS — BP 142/98 | HR 103 | Ht 63.0 in | Wt 147.0 lb

## 2022-05-22 DIAGNOSIS — I2510 Atherosclerotic heart disease of native coronary artery without angina pectoris: Secondary | ICD-10-CM | POA: Diagnosis not present

## 2022-05-22 DIAGNOSIS — E785 Hyperlipidemia, unspecified: Secondary | ICD-10-CM

## 2022-05-22 DIAGNOSIS — Z720 Tobacco use: Secondary | ICD-10-CM

## 2022-05-22 DIAGNOSIS — I427 Cardiomyopathy due to drug and external agent: Secondary | ICD-10-CM

## 2022-05-22 DIAGNOSIS — E1140 Type 2 diabetes mellitus with diabetic neuropathy, unspecified: Secondary | ICD-10-CM | POA: Diagnosis present

## 2022-05-22 DIAGNOSIS — N183 Chronic kidney disease, stage 3 unspecified: Secondary | ICD-10-CM

## 2022-05-22 DIAGNOSIS — I10 Essential (primary) hypertension: Secondary | ICD-10-CM | POA: Diagnosis not present

## 2022-05-22 NOTE — Telephone Encounter (Signed)
Called and per Dr. Alvy Bimler, appt for treatment on 10/26 canceled. Appt with Dr. Alvy Bimler kept on 10/26 and still scheduled. Appts canceled due to her see cardiology today and being referred to another cardiology clinic.

## 2022-05-22 NOTE — Progress Notes (Signed)
Office Visit    Patient Name: Krista [name] Date of Encounter: 05/22/2022  Primary Care Provider:  Drue Flirt, MD Primary Cardiologist:  Quay Burow, MD  Chief Complaint    61 year old female with a history of CAD, cardiomyopathy, hypertension, hyperlipidemia, type 2 diabetes, CKD stage III, endometrial cancer, and tobacco use who presents for follow-up related to cardiomyopathy.   Past Medical History    Past Medical History:  Diagnosis Date   Arthritis    CAD (coronary artery disease)    Depression    Diabetes mellitus    Hypertension    NSTEMI (non-ST elevated myocardial infarction) [facility])    Past Surgical History:  Procedure Laterality Date   ANKLE SURGERY     left ankle - was broken   CORONARY STENT INTERVENTION N/A 12/16/2018   Procedure: CORONARY STENT INTERVENTION;  Surgeon: Leonie Man, MD;  Location: [facility];  Service: Cardiovascular;  Laterality: N/A;  LAD   IR IMAGING GUIDED PORT INSERTION  01/31/2022   IR REMOVAL TUN ACCESS W/ PORT W/O FL MOD SED  01/31/2022   LEFT HEART CATH AND CORONARY ANGIOGRAPHY N/A 12/16/2018   Procedure: LEFT HEART CATH AND CORONARY ANGIOGRAPHY;  Surgeon: Leonie Man, MD;  Location: [facility];  Service: Cardiovascular;  Laterality: N/A;    Allergies  Allergies  Allergen Reactions   Statins Itching    itching itching   Agrostis Alba Pollen Extract [Gramineae Pollens]    Zetia [Ezetimibe] Itching    History of Present Illness    62 year old female with the above past medical history including CAD, cardiomyopathy, hypertension, hyperlipidemia, type 2 diabetes, CKD stage III, endometrial cancer, and tobacco use.  She was hospitalized in May 2020 in the setting of NSTEMI s/p DES-LAD.  She was noted to have high-grade diffuse diagonal branch disease, ramus branch, and RCA disease, all of which were treated medically.  She was hospitalized in September 2021 with sepsis and rhabdomyolysis.  With  mildly elevated, however, this was thought to be due to demand ischemia, ischemic evaluation was not pursued.  She was transitioned from pravastatin to [place].  She was last seen in the office on 10/12/2020 and was stable from a cardiac standpoint.  She denied symptoms concerning for angina.  He was diagnosed with endometrial cancer and has been undergoing treatment with chemotherapy.  Most recent echocardiogram September 2023 showed EF 45 to 50% (decreased from 55 to 60% in 01/2022), mildly decreased LV function, LV global hypokinesis, moderate concentric LVH, G1 DD, mild aortic valve regurgitation, mild dilation of ascending aorta, 39 mm. She was advised to follow-up with cardiology.  She presents today for follow-up accompanied by her mother.  Since her last visit been stable from a cardiac standpoint.  She denies any dyspnea, edema, PND, orthopnea, weight gain.  Denies symptoms concerning for angina.  Overall, she reports feeling well and denies any new concerns today.  Home Medications    Current Outpatient Medications  Medication Sig Dispense Refill   acetaminophen (TYLENOL) 500 MG tablet Take 1,000 mg by mouth every 6 (six) hours as needed for mild pain or headache.     dexamethasone (DECADRON) 4 MG tablet Take 2 tablets by mouth the night before and 2 tabs the morning of chemotherapy, every 3 weeks for 6 cycles 36 tablet 6   Evolocumab (REPATHA SURECLICK) 366 MG/ML SOAJ Inject 140 mg into the skin every 14 (fourteen) days. 2 pen 11   lidocaine-prilocaine (EMLA) cream Apply 1  application topically as needed. 30 g 0   loratadine (CLARITIN) 10 MG tablet Take 1 tablet (10 mg total) by mouth daily as needed for allergies. 30 tablet 11   metFORMIN (GLUCOPHAGE) 1000 MG tablet Take 1,000 mg by mouth 2 (two) times daily with a meal.     nitroGLYCERIN (NITROSTAT) 0.4 MG SL tablet Place 1 tablet (0.4 mg total) under the tongue every 5 (five) minutes as needed for chest pain. 30 tablet 2   ondansetron  (ZOFRAN) 8 MG tablet Take 1 tablet (8 mg total) by mouth every 8 (eight) hours as needed for nausea. 30 tablet 3   polyethylene glycol (MIRALAX) 17 g packet Take 17 g by mouth daily. 30 each 1   prochlorperazine (COMPAZINE) 10 MG tablet Take 1 tablet (10 mg total) by mouth every 6 (six) hours as needed for nausea or vomiting. 30 tablet 0   senna-docusate (SENOKOT-S) 8.6-50 MG tablet Take 2 tablets by mouth 2 (two) times daily. 90 tablet 1   amLODipine (NORVASC) 5 MG tablet Take 1 tablet (5 mg total) by mouth daily. 30 tablet 0   metoprolol tartrate (LOPRESSOR) 25 MG tablet Take 1 tablet (25 mg total) by mouth 2 (two) times daily. 60 tablet 0   No current facility-administered medications for this visit.     Review of Systems    She denies chest pain, palpitations, dyspnea, pnd, orthopnea, n, v, dizziness, syncope, edema, weight gain, or early satiety. All other systems reviewed and are otherwise negative except as noted above.   Physical Exam    VS:  BP (!) 142/98   Pulse (!) 103   Ht '5\' 3"'$  (1.6 m)   Wt 147 lb (66.7 kg)   SpO2 100%   BMI 26.04 kg/m   GEN: Well nourished, well developed, in no acute distress. HEENT: normal. Neck: Supple, no JVD, carotid bruits, or masses. Cardiac: RRR, no murmurs, rubs, or gallops. No clubbing, cyanosis, edema.  Radials/DP/PT 2+ and equal bilaterally.  Respiratory:  Respirations regular and unlabored, clear to auscultation bilaterally. GI: Soft, nontender, nondistended, BS + x 4. MS: no deformity or atrophy. Skin: warm and dry, no rash. Neuro:  Strength and sensation are intact. Psych: Normal affect.  Accessory Clinical Findings    ECG personally reviewed by me today -sinus tachycardia, 103 bpm, LAD, LVH- no acute changes.   Lab Results  Component Value Date   WBC 6.6 05/18/2022   HGB 10.2 (L) 05/18/2022   HCT 30.5 (L) 05/18/2022   MCV 83.6 05/18/2022   PLT 242 05/18/2022   Lab Results  Component Value Date   CREATININE 1.56 (H)  05/18/2022   BUN 26 (H) 05/18/2022   NA 135 05/18/2022   K 4.4 05/18/2022   CL 102 05/18/2022   CO2 25 05/18/2022   Lab Results  Component Value Date   ALT 16 05/18/2022   AST 21 05/18/2022   ALKPHOS 59 05/18/2022   BILITOT 0.3 05/18/2022   Lab Results  Component Value Date   CHOL 134 01/30/2022   HDL 37 (L) 01/30/2022   LDLCALC 82 01/30/2022   TRIG 77 01/30/2022   CHOLHDL 3.6 01/30/2022    Lab Results  Component Value Date   HGBA1C 8.1 (H) 01/30/2022    Assessment & Plan    1. Cardiomyopathy: Most recent echocardiogram September 2023 showed EF 45 to 50% (decreased from 55 to 60% in 01/2022), mildly decreased LV function, LV global hypokinesis, moderate concentric LVH, G1 DD, mild aortic valve regurgitation, mild  dilation of ascending aorta, 39 mm. Likely drug-induced in the setting of recent chemotherapy. Euvolemic and well compensated on exam, denies symptoms concerning for angina.  Given need for ongoing chemotherapy, will refer to advanced heart failure clinic for further cardio-oncology management.   2. CAD: S/p DES-LAD in 12/2018. Stable with no anginal symptoms. No indication for ischemic evaluation.  Continue amlodipine, metoprolol, Repatha.  3. Hypertension: Elevated above goal in office today.  She has not taken her medications today.  She is also slightly tachycardic.  Continue to monitor BP and report BP consistently above 130/80.  Continue amlodipine, metoprolol.  4. Hyperlipidemia: LDL was 82 in 01/2022, above goal.  Will recheck fasting lipid panel, LFTs.  Continue Repatha.  5. Type 2 diabetes: A1c was 8.1 in 01/2022.  Monitored and managed per PCP.  6. CKD stage III: Creatinine was 1.4 in 04/2022. Stable.   7. Tobacco use: She is no longer smoking.   8. Disposition: Follow-up with the AHF clinic. Follow-up in 4-6 months with Dr. Gwenlyn Found.   HYPERTENSION CONTROL Vitals:   05/22/22 0839 05/22/22 0925  BP: (!) 132/92 (!) 142/98    The patient's blood pressure is  elevated above target today.  In order to address the patient's elevated BP: Blood pressure will be monitored at home to determine if medication changes need to be made.; Follow up with general cardiology has been recommended.      Lenna Sciara, NP 05/22/2022, 9:32 AM

## 2022-05-22 NOTE — Patient Instructions (Signed)
Medication Instructions:  Your physician recommends that you continue on your current medications as directed. Please refer to the Current Medication list given to you today.  *If you need a refill on your cardiac medications before your next appointment, please call your pharmacy*   Lab Work: Your physician recommends that you return for lab work in 1-2 weeks Fasting Lipid panel & LFTs  If you have labs (blood work) drawn today and your tests are completely normal, you will receive your results only by: MyChart Message (if you have MyChart) OR A paper copy in the mail If you have any lab test that is abnormal or we need to change your treatment, we will call you to review the results.   Testing/Procedures: NONE ordered at this time of appointment     Follow-Up: At [facility], you and your health needs are our priority.  As part of our continuing mission to provide you with exceptional heart care, we have created designated Provider Care Teams.  These Care Teams include your primary Cardiologist (physician) and Advanced Practice Providers (APPs -  Physician Assistants and Nurse Practitioners) who all work together to provide you with the care you need, when you need it.  We recommend signing up for the patient portal called "MyChart".  Sign up information is provided on this After Visit Summary.  MyChart is used to connect with patients for Virtual Visits (Telemedicine).  Patients are able to view lab/test results, encounter notes, upcoming appointments, etc.  Non-urgent messages can be sent to your provider as well.   To learn more about what you can do with MyChart, go to [email or web address].    Your next appointment:   4-6 month(s)  The format for your next appointment:   In Person  Provider:   Quay Burow, MD     Other Instructions Referral sent to [facility]  Important Information About Sugar

## 2022-05-23 ENCOUNTER — Other Ambulatory Visit: Payer: Self-pay | Admitting: Hematology and Oncology

## 2022-06-01 ENCOUNTER — Telehealth (HOSPITAL_COMMUNITY): Payer: Self-pay | Admitting: Surgery

## 2022-06-01 NOTE — Telephone Encounter (Signed)
I attempted to reach patient regarding referral and scheduling.  I left a message for a return call.

## 2022-06-08 ENCOUNTER — Ambulatory Visit: Payer: Medicare Other

## 2022-06-08 ENCOUNTER — Inpatient Hospital Stay (HOSPITAL_BASED_OUTPATIENT_CLINIC_OR_DEPARTMENT_OTHER): Payer: Medicare Other | Admitting: Hematology and Oncology

## 2022-06-08 ENCOUNTER — Encounter: Payer: Self-pay | Admitting: Hematology and Oncology

## 2022-06-08 ENCOUNTER — Other Ambulatory Visit: Payer: Self-pay

## 2022-06-08 ENCOUNTER — Telehealth (HOSPITAL_COMMUNITY): Payer: Self-pay | Admitting: Cardiology

## 2022-06-08 VITALS — BP 132/84 | HR 58 | Temp 97.4°F | Resp 18 | Ht 63.0 in | Wt 146.8 lb

## 2022-06-08 DIAGNOSIS — C541 Malignant neoplasm of endometrium: Secondary | ICD-10-CM

## 2022-06-08 DIAGNOSIS — I427 Cardiomyopathy due to drug and external agent: Secondary | ICD-10-CM

## 2022-06-08 DIAGNOSIS — Z5111 Encounter for antineoplastic chemotherapy: Secondary | ICD-10-CM | POA: Diagnosis not present

## 2022-06-08 NOTE — Assessment & Plan Note (Signed)
I have reviewed recommendation from cardiologist and explained to the patient and her mother why we cannot proceed with further treatment in the presence of cardiomyopathy I told her that the cardiomyopathy could be caused by her treatment The patient did not answer her phone recently when they attempted to reach her for an appointment I gave her the number to call for her to schedule appointment to see cardiologist I will see her again next month for further follow-up I plan to repeat imaging study again at the end of the year

## 2022-06-08 NOTE — Progress Notes (Signed)
[place] OFFICE PROGRESS NOTE  Patient Care Team: Drue Flirt, MD as PCP - General (Family Medicine) Lorretta Harp, MD as PCP - Cardiology (Cardiology) Heath Lark, MD as Consulting Physician (Hematology and Oncology)  ASSESSMENT & PLAN:  Endometrial cancer [facility]) I have reviewed recommendation from cardiologist and explained to the patient and her mother why we cannot proceed with further treatment in the presence of cardiomyopathy I told her that the cardiomyopathy could be caused by her treatment The patient did not answer her phone recently when they attempted to reach her for an appointment I gave her the number to call for her to schedule appointment to see cardiologist I will see her again next month for further follow-up I plan to repeat imaging study again at the end of the year  Cardiomyopathy [facility]) She has no clinical signs or symptoms of congestive heart failure She will continue medical management for her diabetes and hypertension  Orders Placed This Encounter  Procedures   CBC with Differential/Platelet    Standing Status:   Standing    Number of Occurrences:   22    Standing Expiration Date:   06/09/2023   Comprehensive metabolic panel    Standing Status:   Standing    Number of Occurrences:   33    Standing Expiration Date:   06/09/2023    All questions were answered. The patient knows to call the clinic with any problems, questions or concerns. The total time spent in the appointment was 25 minutes encounter with patients including review of chart and various tests results, discussions about plan of care and coordination of care plan   Heath Lark, MD 06/08/2022 10:44 AM  INTERVAL HISTORY: Please see below for problem oriented charting. she returns for treatment follow-up with her mother Both the patient and her mother does not understand why her treatment was stopped despite previous explanation about her abnormal  echocardiogram She was seen by cardiologist at the cardiology clinic who recommended heart failure clinic who specialize in oncology Apparently, the patient did not answer her phone.  Multiple attempts were made to reach out to her regarding this appointment In the meantime, she denies cancer associated pain She has no recent signs or symptoms of congestive heart failure  REVIEW OF SYSTEMS:   Constitutional: Denies fevers, chills or abnormal weight loss Eyes: Denies blurriness of vision Ears, nose, mouth, throat, and face: Denies mucositis or sore throat Respiratory: Denies cough, dyspnea or wheezes Cardiovascular: Denies palpitation, chest discomfort or lower extremity swelling Gastrointestinal:  Denies nausea, heartburn or change in bowel habits Skin: Denies abnormal skin rashes Lymphatics: Denies new lymphadenopathy or easy bruising Neurological:Denies numbness, tingling or new weaknesses Behavioral/Psych: Mood is stable, no new changes  All other systems were reviewed with the patient and are negative.  I have reviewed the past medical history, past surgical history, social history and family history with the patient and they are unchanged from previous note.  ALLERGIES:  is allergic to statins, agrostis alba pollen extract [gramineae pollens], and zetia [ezetimibe].  MEDICATIONS:  Current Outpatient Medications  Medication Sig Dispense Refill   acetaminophen (TYLENOL) 500 MG tablet Take 1,000 mg by mouth every 6 (six) hours as needed for mild pain or headache.     amLODipine (NORVASC) 5 MG tablet Take 1 tablet (5 mg total) by mouth daily. 30 tablet 0   dexamethasone (DECADRON) 4 MG tablet Take 2 tablets by mouth the night before and 2 tabs the morning of  chemotherapy, every 3 weeks for 6 cycles 36 tablet 6   Evolocumab (REPATHA SURECLICK) 415 MG/ML SOAJ Inject 140 mg into the skin every 14 (fourteen) days. 2 pen 11   lidocaine-prilocaine (EMLA) cream Apply 1 application topically  as needed. 30 g 0   loratadine (CLARITIN) 10 MG tablet Take 1 tablet (10 mg total) by mouth daily as needed for allergies. 30 tablet 11   metFORMIN (GLUCOPHAGE) 1000 MG tablet Take 1,000 mg by mouth 2 (two) times daily with a meal.     metoprolol tartrate (LOPRESSOR) 25 MG tablet Take 1 tablet (25 mg total) by mouth 2 (two) times daily. 60 tablet 0   nitroGLYCERIN (NITROSTAT) 0.4 MG SL tablet Place 1 tablet (0.4 mg total) under the tongue every 5 (five) minutes as needed for chest pain. 30 tablet 2   ondansetron (ZOFRAN) 8 MG tablet Take 1 tablet (8 mg total) by mouth every 8 (eight) hours as needed for nausea. 30 tablet 3   polyethylene glycol (MIRALAX) 17 g packet Take 17 g by mouth daily. 30 each 1   prochlorperazine (COMPAZINE) 10 MG tablet Take 1 tablet (10 mg total) by mouth every 6 (six) hours as needed for nausea or vomiting. 30 tablet 0   senna-docusate (SENOKOT-S) 8.6-50 MG tablet Take 2 tablets by mouth 2 (two) times daily. 90 tablet 1   No current facility-administered medications for this visit.    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    12/14/2020 Imaging   CT chest abdomen and pelvis IMPRESSION:  1.  2 small nodules in the right lung, nonspecific.  2.  Prominent precarinal lymph node versus adjacent small nodes.  3.  Enlarged heterogeneous uterine fundus.  4.  No definite adenopathy in the abdomen or pelvis. Nonspecific small lymph nodes are present, not enlarged by CT criteria.  5.  Mild wall thickening of the anterior aspect of the urinary bladder which may be due to to nondistention or inflammation.    12/27/2020 Pathology Results   SPECIMEN     Procedure:    Total hysterectomy and bilateral salpingo-oophorectomy   TUMOR      Histologic Type:    Serous carcinoma     Histologic Grade:    Not applicable     Myometrial Invasion:    Present       Depth of Myometrial Invasion:    11 mm       Myometrial Thickness:    12 mm       Percentage of Myometrial Invasion:    92 %     Uterine Serosa Involvement:    Not identified     Cervical Stromal Involvement:    Not identified     Other Tissue / Organ Involvement:    Not identified     Peritoneal / Ascitic Fluid:    Not identified     Lymphovascular Invasion (LVI):    Present   MARGINS     Margin Status:    Not applicable   REGIONAL LYMPH NODES     Regional Lymph Node Status:           :    All regional lymph nodes negative for tumor cells         Lymph Nodes Examined:  Total Number of Pelvic Nodes Examined:    3           Number of Pelvic Sentinel Nodes Examined:    3           Total Number of Para-aortic Nodes Examined:    0           Number of Para-aortic Sentinel Nodes Examined:    Not applicable   DISTANT METASTASIS     Distant Site(s) Involved:    Not applicable   PATHOLOGIC STAGE CLASSIFICATION (pTNM, AJCC 8th Edition)     Reporting of pT, pN, and (when applicable) pM categories is based on information available to the pathologist at the time the report is issued. As per the AJCC (Chapter 1, 8th Ed.) it is the managing physician's responsibility to establish the final pathologic stage based upon all pertinent information, including but potentially not limited to this pathology report.     TNM Descriptors:    Not applicable     Tumor Modifier:    Not applicable     pT Category:    pT1b     Regional Lymph Nodes Modifier:    (sn)     pN Category:    pN0   The endometrial adenocarcinoma was analyzed by IHC for DNA mismatch repair proteins.  The neoplasm retained nuclear expression of all four mismatch repair proteins, MLH1, PMS2, MSH2, MSH6.    Estrogen receptors (ER): Negative Progesterone receptors (PR): Negative  Controls worked  appropriately.   A HER2 stain is POSITIVE (3+).   12/27/2020 Surgery   At [facility], she underwent a robotic hysterectomy/BSO, sentinel node dissection and partial omentectomy. She was found to have an enlarged uterus. An enlarged right pelvic sentinel node.    02/23/2021 Echocardiogram   Left Ventricle: Systolic function is normal. EF: 55-60%.    Left Ventricle: Doppler parameters consistent with mild diastolic  dysfunction and low to normal LA pressure.    Left Ventricle: There is mild concentric hypertrophy.    Aortic Valve: Mild aortic valve regurgitation.   01/27/2022 Imaging   CT chest imaging 1. No pulmonary embolus. 2. Findings consistent with intrathoracic malignancy. Multiple pulmonary nodules throughout both lungs of varying sizes, consistent with metastatic disease. Primary site of malignancy may represent a 4 cm spiculated nodule in the right perihilar region spanning the fissure that is cavitating. Alternatively all of these nodules may be metastatic given history of cancer, type not specified.  3. Multifocal mediastinal and bilateral hilar adenopathy, consistent metastatic disease. 4. Nondisplaced left anterior seventh and eighth rib fractures, soft tissue thickening adjacent to the seventh rib fracture suggests this may be pathologic 5. Right hydronephrosis is partially included in the field of view in the upper abdomen. Small soft tissue density medial to the right hepatic lobe posterior to the right kidney is nonspecific but may represent metastatic disease. Recommend staging CT of the abdomen and pelvis with oral and IV contrast. 6. Moderate emphysema. 7. Aortic atherosclerosis.  Coronary artery calcifications.   01/28/2022 Initial Diagnosis   Endometrial cancer ([place])   01/28/2022 Cancer Staging   Staging form: Corpus Uteri - Carcinoma and Carcinosarcoma, AJCC 8th Edition - Clinical stage from 01/28/2022: FIGO Stage IVB (rcT1b, cN2a, cM1) - Signed by Heath Lark, MD on  01/28/2022 Stage prefix: Recurrence   01/28/2022 Imaging   CT abdomen and pelvis 1. Multiple abdominal and pelvic metastatic implants as above. 2. Moderate right hydronephrosis secondary to mass effect and obstruction of the distal right ureter  by a right pelvic metastatic implant. 3. Multiple pulmonary metastatic disease in the visualized right lung base. 4. No bowel obstruction. Normal appendix. 5. Aortic Atherosclerosis (ICD10-I70.0).   01/30/2022 Echocardiogram    1. Left ventricular ejection fraction, by estimation, is 55 to 60%. The left ventricle has normal function. The left ventricle has no regional wall motion abnormalities. There is moderate concentric left ventricular  hypertrophy. Left ventricular diastolic parameters are consistent with Grade I diastolic dysfunction (impaired relaxation).   2. Right ventricular systolic function is normal. The right ventricular size is normal. There is normal pulmonary artery systolic pressure.   3. Left atrial size was mildly dilated.   4. The mitral valve is normal in structure. Trivial mitral valve regurgitation. No evidence of mitral stenosis.   5. The aortic valve is tricuspid. Aortic valve regurgitation is trivial. No aortic stenosis is present.   6. Aortic dilatation noted. There is mild dilatation of the aortic root, measuring 39 mm.   7. The inferior vena cava is normal in size with <50% respiratory variability, suggesting right atrial pressure of 8 mmHg.    01/31/2022 - 04/04/2022 Chemotherapy   Patient is on Treatment Plan : UTERINE SEROUS CARCINOMA Carboplatin + Paclitaxel + Trastuzumab q21d x 6 Cycles / Trastuzumab q21d     01/31/2022 - 05/18/2022 Chemotherapy   Patient is on Treatment Plan : UTERINE SEROUS CARCINOMA Carboplatin + Paclitaxel + Trastuzumab q21d x 6 Cycles / Trastuzumab q21d     04/03/2022 Imaging   1. Pulmonary nodules are either reduced in size or completely resolved. No new nodules. 2. Marked reduction in size  peritoneal nodular metastasis. Multiple nodular lesions are no longer measurable. 3. Interval reduction in size of implant in the RIGHT operator space. While this implant is smaller, lesion continues to obstruct the RIGHT ureter with persistent RIGHT hydronephrosis and hydroureter. 4. No new metastatic lesions are present.   05/09/2022 Echocardiogram    1. Left ventricular ejection fraction, by estimation, is 45 to 50%. The left ventricle has mildly decreased function. The left ventricle demonstrates global hypokinesis. There is moderate concentric left ventricular hypertrophy. Left ventricular diastolic parameters are consistent with Grade I diastolic dysfunction (impaired relaxation). The average left ventricular global longitudinal strain is -10.8 %. The global longitudinal strain is abnormal.  2. Right ventricular systolic function is normal. The right ventricular size is normal.  3. The mitral valve is normal in structure. No evidence of mitral valve regurgitation.  4. The aortic valve is tricuspid. Aortic valve regurgitation is mild.  5. There is mild dilatation of the ascending aorta, measuring 39 mm.  6. The inferior vena cava is normal in size with <50% respiratory variability, suggesting right atrial pressure of 8 mmHg.     PHYSICAL EXAMINATION: ECOG PERFORMANCE STATUS: 0 - Asymptomatic  Vitals:   06/08/22 0937  BP: 132/84  Pulse: (!) 58  Resp: 18  Temp: (!) 97.4 F (36.3 C)  SpO2: 100%   Filed Weights   06/08/22 0937  Weight: 146 lb 12.8 oz (66.6 kg)    GENERAL:alert, no distress and comfortable NEURO: alert & oriented x 3 with fluent speech, no focal motor/sensory deficits  LABORATORY DATA:  I have reviewed the data as listed    Component Value Date/Time   NA 135 05/18/2022 0910   NA 141 02/25/2019 1222   K 4.4 05/18/2022 0910   CL 102 05/18/2022 0910   CO2 25 05/18/2022 0910   GLUCOSE 422 (H) 05/18/2022 0910   BUN 26 (H)  05/18/2022 0910   BUN 17 02/25/2019  1222   CREATININE 1.56 (H) 05/18/2022 0910   CALCIUM 8.7 (L) 05/18/2022 0910   PROT 8.1 05/18/2022 0910   PROT 6.9 09/22/2019 1419   ALBUMIN 3.7 05/18/2022 0910   ALBUMIN 3.9 09/22/2019 1419   AST 21 05/18/2022 0910   ALT 16 05/18/2022 0910   ALKPHOS 59 05/18/2022 0910   BILITOT 0.3 05/18/2022 0910   GFRNONAA 38 (L) 05/18/2022 0910   GFRAA >60 05/16/2019 0335    No results found for: "SPEP", "UPEP"  Lab Results  Component Value Date   WBC 6.6 05/18/2022   NEUTROABS 5.7 05/18/2022   HGB 10.2 (L) 05/18/2022   HCT 30.5 (L) 05/18/2022   MCV 83.6 05/18/2022   PLT 242 05/18/2022      Chemistry      Component Value Date/Time   NA 135 05/18/2022 0910   NA 141 02/25/2019 1222   K 4.4 05/18/2022 0910   CL 102 05/18/2022 0910   CO2 25 05/18/2022 0910   BUN 26 (H) 05/18/2022 0910   BUN 17 02/25/2019 1222   CREATININE 1.56 (H) 05/18/2022 0910      Component Value Date/Time   CALCIUM 8.7 (L) 05/18/2022 0910   ALKPHOS 59 05/18/2022 0910   AST 21 05/18/2022 0910   ALT 16 05/18/2022 0910   BILITOT 0.3 05/18/2022 0910

## 2022-06-08 NOTE — Assessment & Plan Note (Signed)
She has no clinical signs or symptoms of congestive heart failure She will continue medical management for her diabetes and hypertension

## 2022-06-08 NOTE — Telephone Encounter (Signed)
Scheduled np appt w/AS in November

## 2022-06-19 ENCOUNTER — Encounter (HOSPITAL_COMMUNITY): Payer: Self-pay | Admitting: Cardiology

## 2022-06-19 ENCOUNTER — Ambulatory Visit (HOSPITAL_COMMUNITY)
Admission: RE | Admit: 2022-06-19 | Discharge: 2022-06-19 | Disposition: A | Discharge status: Home / Self Care | Payer: Medicare Other | Source: Ambulatory Visit | Attending: Cardiology | Admitting: Cardiology

## 2022-06-19 VITALS — BP 102/60 | HR 74 | Wt 144.0 lb

## 2022-06-19 DIAGNOSIS — I427 Cardiomyopathy due to drug and external agent: Secondary | ICD-10-CM | POA: Diagnosis not present

## 2022-06-19 DIAGNOSIS — C541 Malignant neoplasm of endometrium: Secondary | ICD-10-CM

## 2022-06-19 DIAGNOSIS — I2510 Atherosclerotic heart disease of native coronary artery without angina pectoris: Secondary | ICD-10-CM | POA: Insufficient documentation

## 2022-06-19 DIAGNOSIS — I509 Heart failure, unspecified: Secondary | ICD-10-CM | POA: Diagnosis present

## 2022-06-19 LAB — COMPREHENSIVE METABOLIC PANEL
ALT: 14 U/L (ref 0–44)
AST: 15 U/L (ref 15–41)
Albumin: 3.1 g/dL — ABNORMAL LOW (ref 3.5–5.0)
Alkaline Phosphatase: 49 U/L (ref 38–126)
Anion gap: 10 (ref 5–15)
BUN: 26 mg/dL — ABNORMAL HIGH (ref 6–20)
CO2: 24 mmol/L (ref 22–32)
Calcium: 9 mg/dL (ref 8.9–10.3)
Chloride: 103 mmol/L (ref 98–111)
Creatinine, Ser: 1.32 mg/dL — ABNORMAL HIGH (ref 0.44–1.00)
GFR, Estimated: 47 mL/min — ABNORMAL LOW (ref >60)
Glucose, Bld: 260 mg/dL — ABNORMAL HIGH (ref 70–99)
Potassium: 4.1 mmol/L (ref 3.5–5.1)
Sodium: 137 mmol/L (ref 135–145)
Total Bilirubin: 0.4 mg/dL (ref 0.3–1.2)
Total Protein: 7.2 g/dL (ref 6.5–8.1)

## 2022-06-19 LAB — BRAIN NATRIURETIC PEPTIDE: B Natriuretic Peptide: 40.4 pg/mL (ref 0.0–100.0)

## 2022-06-19 MED ORDER — SPIRONOLACTONE 25 MG PO TABS: 12.5000 mg | ORAL_TABLET | Freq: Every day | ORAL | 3 refills | Status: DC

## 2022-06-19 MED ORDER — LOSARTAN POTASSIUM 25 MG PO TABS: 12.5000 mg | ORAL_TABLET | Freq: Every day | ORAL | 3 refills | Status: DC

## 2022-06-19 MED ORDER — METOPROLOL SUCCINATE ER 25 MG PO TB24: 25.0000 mg | ORAL_TABLET | Freq: Every day | ORAL | 3 refills | Status: DC

## 2022-06-19 NOTE — Patient Instructions (Signed)
STOP Amlodipine.  STOP Metoprol Tartrate.  START Spironolactone 12.'5mg'$  (1/2 Tab) daily.  START Losartan 12.'5mg'$  (1/2 Tab ) daily.  START Toprol XL 25 mg daily.  Labs done today, your results will be available in MyChart, we will contact you for abnormal readings.  Your physician has requested that you have an echocardiogram. Echocardiography is a painless test that uses sound waves to create images of your heart. It provides your doctor with information about the size and shape of your heart and how well your heart's chambers and valves are working. This procedure takes approximately one hour. There are no restrictions for this procedure. Please do NOT wear cologne, perfume, aftershave, or lotions (deodorant is allowed). Please arrive 15 minutes prior to your appointment time.  Your physician recommends that you schedule a follow-up appointment in: 2 months ( January 2024)  ** please call the office in December to arrange your follow up appointment **  If you have any questions or concerns before your next appointment please send [place] a message through [place] or call our office at [phone].    TO LEAVE A MESSAGE FOR THE NURSE SELECT OPTION 2, PLEASE LEAVE A MESSAGE INCLUDING: YOUR NAME DATE OF BIRTH CALL BACK NUMBER REASON FOR CALL**this is important as we prioritize the call backs  YOU WILL RECEIVE A CALL BACK THE SAME DAY AS LONG AS YOU CALL BEFORE 4:00 PM  At the [facility], you and your health needs are our priority. As part of our continuing mission to provide you with exceptional heart care, we have created designated Provider Care Teams. These Care Teams include your primary Cardiologist (physician) and Advanced Practice Providers (APPs- Physician Assistants and Nurse Practitioners) who all work together to provide you with the care you need, when you need it.   You may see any of the following providers on your designated Care Team at your next follow  up: Dr Glori Bickers Dr Loralie Champagne Dr. Roxana Hires, NP Lyda Jester, [place] [facility], [place] Forestine Na, NP Audry Riles, PharmD   Please be sure to bring in all your medications bottles to every appointment.

## 2022-06-20 ENCOUNTER — Telehealth (HOSPITAL_COMMUNITY): Payer: Self-pay | Admitting: *Deleted

## 2022-06-20 NOTE — Telephone Encounter (Signed)
Pt called to get med clarification. I spoke with pt she is aware to stop Metoprol Tartrate and start Toprol XL.

## 2022-06-21 NOTE — Progress Notes (Signed)
ADVANCED HEART FAILURE CLINIC NOTE  Referring Physician: Drue Flirt, MD  Primary Care: Drue Flirt, MD Primary Cardiologist: Dr. Gwenlyn Found Oncologist: Heath Lark MD  HPI: Krista [name] is a 61 y.o. female with recently diagnosed heart failure with reduced ejection fraction, endometrial cancer, hypertension, type 2 diabetes and history of CAD status post PCI to the mid LAD in May 2020 presenting today to establish care.  Krista [name] was originally diagnosed with endometrial cancer  in 11/2020 via endometrial biopsy at [facility]. She underwent total hysterectomy and bilateral salpingo-oophorectomy with sentinal lymph node dissection and partial omentectomy. She received one cycle of taxol/carbo/herceptin on 03/23/21 but was lost to follow up afterwards. In June 2023, she presented to [facility] for left sided chest pain after a fall. During that admission contrast CT showed multiple abdominal and pelvic mets from stage IV endometrial cancer.  Since that time she has followed with Dr. Alvy Bimler and is currently undergoing treatment with carboplatin+paclitaxel+trastuzumab every 21 days for 6 cycles. In September 2023 TTE demonstrated a new reduction in LVEF and she was subsequently referred to cardio-oncology for further evaluation.   From a functional standpoint, Ms. Cubbage is doing fairly well. She can perform ADLs without significant difficulty; does report some SOB but no chest pain, lightheadness or syncope.   Activity level/exercise tolerance:  NYHA IIB Orthopnea:  Sleeps on 2 pillows Paroxysmal noctural dyspnea:  no Chest pain/pressure:  no Orthostatic lightheadedness:  no Palpitations:  no Lower extremity edema:  intermittent, stable now Presyncope/syncope:  no Cough:  no  Past Medical History:  Diagnosis Date   Arthritis    CAD (coronary artery disease)    Depression    Diabetes mellitus    Hypertension    NSTEMI (non-ST elevated myocardial infarction) ([name])     Current  Outpatient Medications  Medication Sig Dispense Refill   acetaminophen (TYLENOL) 500 MG tablet Take 1,000 mg by mouth every 6 (six) hours as needed for mild pain or headache.     dexamethasone (DECADRON) 4 MG tablet Take 2 tablets by mouth the night before and 2 tabs the morning of chemotherapy, every 3 weeks for 6 cycles 36 tablet 6   Evolocumab (REPATHA SURECLICK) 956 MG/ML SOAJ Inject 140 mg into the skin every 14 (fourteen) days. 2 pen 11   lidocaine-prilocaine (EMLA) cream Apply 1 application topically as needed. 30 g 0   loratadine (CLARITIN) 10 MG tablet Take 1 tablet (10 mg total) by mouth daily as needed for allergies. 30 tablet 11   losartan (COZAAR) 25 MG tablet Take 0.5 tablets (12.5 mg total) by mouth daily. 45 tablet 3   metFORMIN (GLUCOPHAGE) 1000 MG tablet Take 1,000 mg by mouth 2 (two) times daily with a meal.     metoprolol succinate (TOPROL XL) 25 MG 24 hr tablet Take 1 tablet (25 mg total) by mouth daily. 90 tablet 3   nitroGLYCERIN (NITROSTAT) 0.4 MG SL tablet Place 1 tablet (0.4 mg total) under the tongue every 5 (five) minutes as needed for chest pain. 30 tablet 2   ondansetron (ZOFRAN) 8 MG tablet Take 1 tablet (8 mg total) by mouth every 8 (eight) hours as needed for nausea. 30 tablet 3   polyethylene glycol (MIRALAX) 17 g packet Take 17 g by mouth daily. 30 each 1   prochlorperazine (COMPAZINE) 10 MG tablet Take 1 tablet (10 mg total) by mouth every 6 (six) hours as needed for nausea or vomiting. 30 tablet 0   senna-docusate (SENOKOT-S) 8.6-50  MG tablet Take 2 tablets by mouth 2 (two) times daily. 90 tablet 1   spironolactone (ALDACTONE) 25 MG tablet Take 0.5 tablets (12.5 mg total) by mouth daily. 45 tablet 3   No current facility-administered medications for this encounter.    Allergies  Allergen Reactions   Statins Itching    itching itching   Agrostis Alba Pollen Extract [Gramineae Pollens]    Zetia [Ezetimibe] Itching      Social History   Socioeconomic  History   Marital status: Single    Spouse name: Not on file   Number of children: Not on file   Years of education: Not on file   Highest education level: Not on file  Occupational History   Not on file  Tobacco Use   Smoking status: Former    Packs/day: 0.50    Years: 33.00    Total pack years: 16.50    Types: Cigarettes    Quit date: 01/08/2022    Years since quitting: 0.4   Smokeless tobacco: Never  Vaping Use   Vaping Use: Never used  Substance and Sexual Activity   Alcohol use: No   Drug use: No   Sexual activity: Yes    Birth control/protection: Post-menopausal  Other Topics Concern   Not on file  Social History Narrative   Not on file   Social Determinants of Health   Financial Resource Strain: Not on file  Food Insecurity: Not on file  Transportation Needs: No Transportation Needs (12/13/2020)   PRAPARE - Hydrologist (Medical): No    Lack of Transportation (Non-Medical): No  Physical Activity: Not on file  Stress: Not on file  Social Connections: Not on file  Intimate Partner Violence: Not on file      Family History  Problem Relation Age of Onset   Diabetes Mother    COPD Mother    Hypertension Mother    Diabetes Father    Hypertension Father     PHYSICAL EXAM: Vitals:   06/19/22 1152  BP: 102/60  Pulse: 74  SpO2: 95%   GENERAL: Well nourished, well developed, and in no apparent distress at rest.  HEENT: Negative for arcus senilis or xanthelasma. There is no scleral icterus.  The mucous membranes are pink and moist.   NECK: Supple, No masses. Normal carotid upstrokes without bruits. No masses or thyromegaly.    CHEST: There are no chest wall deformities. There is no chest wall tenderness. Respirations are unlabored.  Lungs- cta b/l CARDIAC:  JVP: <8 cm H2O         Normal S1, S2  Normal rate with regular rhythm. No murmurs, rubs or gallops.  Pulses are 2+ and symmetrical in upper and lower extremities. 1+ edema.   ABDOMEN: Soft, non-tender, non-distended. There are no masses or hepatomegaly. There are normal bowel sounds.  EXTREMITIES: Warm and well perfused with no cyanosis, clubbing.  LYMPHATIC: No axillary or supraclavicular lymphadenopathy.  NEUROLOGIC: Patient is oriented x3 with no focal or lateralizing neurologic deficits.  PSYCH: Patients affect is appropriate, there is no evidence of anxiety or depression.  SKIN: Warm and dry; no lesions or wounds.   DATA REVIEW  ECG:NSR on 06/19/22  ECHO: 05/08/22: LVEF 45%-50%, GLS -10.8%. normal RV function.  01/29/22: LVEF 55%-60%, GI DD 05/13/19: LVEF 60-65%  CATH: 12/16/18: Prox LAD lesion is 99% stenosed with 99% stenosed side branch in Ost 1st Diag. A drug-eluting stent was successfully placed using a STENT SYNERGY DES 2.75X20.  Postdilated to 3.1 mm Post intervention, there is a 0% residual stenosis. Post intervention, the side branch was reduced to 90% residual stenosis with downstream 80% stenosis. Plan is to treat this medically. ------------------------------------------------------ Prox RCA lesion is 60% stenosed. Mid RCA lesion is 35% stenosed. Mid RCA to Dist RCA lesion is 55% stenosed. RPDA lesion is 65% stenosed. Ost Ramus to Ramus lesion is 45% stenosed. The left ventricular systolic function is normal. The left ventricular ejection fraction is 50-55% by visual estimate. -Apical hypokinesis ==============================   SUMMARY Severe CAD with severe 99% proximal-mid LAD at takeoff of 1st Diag & Septal branches associated with severe 99% ostial stenosis in both branches (1st Diag is a relatively small caliber vessel and diffusely diseased), diffuse moderate to severe 55 to 65% stenoses in proximal and mid RCA as well as RPDA. Successful DES PCI of LAD lesion with synergy DES 2.7 mm x 20 mm (3.1 mm) preserving diagonal and septal flow. Preserved LVEF with apparent apical hypokinesis consistent with echocardiogram findings Normal  LVEDP   ASSESSMENT & PLAN:  NYHA II, Heart Failure with reduced ejection fraction Etiology of ZD:GLOVFIE reduction in LVEF likely nonischemic from herceptin toxicity; although, she has underlying multivessel CAD her symptoms and reduction in LVEF w/ global hypokinesis appear to be consistent with chemotherapy induced cardiomyopathy. Will discuss possibility of holding herceptin for 1-2 cycles w/ oncologist.  NYHA class / AHA Stage:II Volume status & Diuretics: euvolemic Vasodilators:stop amlodipine, start losartan 12.'5mg'$   Beta-Blocker:toprol xl 25 MRA: start spiro 12.'5mg'$  daily Cardiometabolic:will start on follow up Devices therapies & Valvulopathies:not currently indicated Advanced therapies:not indicated  2. CAD - S/P PCI to the LAD in 2020; LHC at the time w/ mutivessel CAD involving the RCA & ramus.  - continue repatha, asa '81mg'$  daily  3. Stage IV metastatic uterine cancer - History detailed above - Dx in 4/22 at [place] now s/p total hysterectomy and bilateral salpingo-oophorectomy with sentinal lymph node dissection and partial omentectomy. She received one cycle of taxol/carbo/herceptin on 03/23/21 but was lost to follow up afterwards. Diagnosed w/ metastatic disease on CT scan at [facility] in June 2023.  - Plan for q21d x 6cycles of carboplatin+paclitaxel+trastuzuma  Rohini Jaroszewski Advanced Heart Failure Mechanical Circulatory Support

## 2022-07-13 ENCOUNTER — Other Ambulatory Visit: Payer: Self-pay

## 2022-07-13 ENCOUNTER — Inpatient Hospital Stay: Payer: Medicare Other | Attending: Hematology and Oncology

## 2022-07-13 ENCOUNTER — Encounter: Payer: Self-pay | Admitting: Hematology and Oncology

## 2022-07-13 ENCOUNTER — Inpatient Hospital Stay (HOSPITAL_BASED_OUTPATIENT_CLINIC_OR_DEPARTMENT_OTHER): Payer: Medicare Other | Admitting: Hematology and Oncology

## 2022-07-13 VITALS — BP 145/85 | HR 64 | Temp 97.7°F | Resp 18 | Ht 63.0 in | Wt 145.2 lb

## 2022-07-13 DIAGNOSIS — C7802 Secondary malignant neoplasm of left lung: Secondary | ICD-10-CM

## 2022-07-13 DIAGNOSIS — C7801 Secondary malignant neoplasm of right lung: Secondary | ICD-10-CM

## 2022-07-13 DIAGNOSIS — C7800 Secondary malignant neoplasm of unspecified lung: Secondary | ICD-10-CM | POA: Diagnosis not present

## 2022-07-13 DIAGNOSIS — Z79899 Other long term (current) drug therapy: Secondary | ICD-10-CM | POA: Insufficient documentation

## 2022-07-13 DIAGNOSIS — F17200 Nicotine dependence, unspecified, uncomplicated: Secondary | ICD-10-CM | POA: Diagnosis not present

## 2022-07-13 DIAGNOSIS — N183 Chronic kidney disease, stage 3 unspecified: Secondary | ICD-10-CM

## 2022-07-13 DIAGNOSIS — I2510 Atherosclerotic heart disease of native coronary artery without angina pectoris: Secondary | ICD-10-CM | POA: Insufficient documentation

## 2022-07-13 DIAGNOSIS — C541 Malignant neoplasm of endometrium: Secondary | ICD-10-CM

## 2022-07-13 LAB — COMPREHENSIVE METABOLIC PANEL
ALT: 9 U/L (ref 0–44)
AST: 15 U/L (ref 15–41)
Albumin: 4 g/dL (ref 3.5–5.0)
Alkaline Phosphatase: 55 U/L (ref 38–126)
Anion gap: 5 (ref 5–15)
BUN: 27 mg/dL — ABNORMAL HIGH (ref 6–20)
CO2: 31 mmol/L (ref 22–32)
Calcium: 9.4 mg/dL (ref 8.9–10.3)
Chloride: 103 mmol/L (ref 98–111)
Creatinine, Ser: 1.69 mg/dL — ABNORMAL HIGH (ref 0.44–1.00)
GFR, Estimated: 35 mL/min — ABNORMAL LOW (ref >60)
Glucose, Bld: 86 mg/dL (ref 70–99)
Potassium: 3.7 mmol/L (ref 3.5–5.1)
Sodium: 139 mmol/L (ref 135–145)
Total Bilirubin: 0.5 mg/dL (ref 0.3–1.2)
Total Protein: 7.8 g/dL (ref 6.5–8.1)

## 2022-07-13 LAB — CBC WITH DIFFERENTIAL/PLATELET
Abs Immature Granulocytes: 0.01 10*3/uL (ref 0.00–0.07)
Basophils Absolute: 0 10*3/uL (ref 0.0–0.1)
Basophils Relative: 0 %
Eosinophils Absolute: 0.2 10*3/uL (ref 0.0–0.5)
Eosinophils Relative: 3 %
HCT: 33.4 % — ABNORMAL LOW (ref 36.0–46.0)
Hemoglobin: 10.9 g/dL — ABNORMAL LOW (ref 12.0–15.0)
Immature Granulocytes: 0 %
Lymphocytes Relative: 31 %
Lymphs Abs: 1.9 10*3/uL (ref 0.7–4.0)
MCH: 28.5 pg (ref 26.0–34.0)
MCHC: 32.6 g/dL (ref 30.0–36.0)
MCV: 87.2 fL (ref 80.0–100.0)
Monocytes Absolute: 0.8 10*3/uL (ref 0.1–1.0)
Monocytes Relative: 14 %
Neutro Abs: 3.2 10*3/uL (ref 1.7–7.7)
Neutrophils Relative %: 52 %
Platelets: 240 10*3/uL (ref 150–400)
RBC: 3.83 MIL/uL — ABNORMAL LOW (ref 3.87–5.11)
RDW: 18.7 % — ABNORMAL HIGH (ref 11.5–15.5)
WBC: 6.1 10*3/uL (ref 4.0–10.5)
nRBC: 0 % (ref 0.0–0.2)

## 2022-07-13 MED ORDER — HEPARIN SOD (PORK) LOCK FLUSH 100 UNIT/ML IV SOLN
500.0000 [IU] | Freq: Once | INTRAVENOUS | Status: AC
  Administered 2022-07-13: 500 [IU]

## 2022-07-13 MED ORDER — SODIUM CHLORIDE 0.9% FLUSH
10.0000 mL | Freq: Once | INTRAVENOUS | Status: AC
  Administered 2022-07-13: 10 mL

## 2022-07-13 NOTE — Assessment & Plan Note (Signed)
She has intermittent acute on chronic renal failure We discussed importance of hydration and risk factor modification

## 2022-07-13 NOTE — Assessment & Plan Note (Signed)
I have reviewed documentation and recommendation from cardiologist She is not symptomatic I recommend repeat imaging study next week If she have residual disease or progression of disease, I will have to put her back on chemotherapy She is in agreement

## 2022-07-13 NOTE — Assessment & Plan Note (Signed)
She has multiple cardiovascular risk factors She is no longer taking trastuzumab and she has no signs or symptoms to suggest heart failure She will continue medical management and cardiology review as well as echocardiogram as scheduled

## 2022-07-13 NOTE — Progress Notes (Signed)
[facility] OFF[facility] PROGRESS NOTE  Patient Care Team: Drue Flirt, MD as PCP - General (Family Medicine) Lorretta Harp, MD as PCP - Cardiology (Cardiology) Heath Lark, MD as Consulting Physician (Hematology and Oncology)  ASSESSMENT & PLAN:  Endometrial cancer [facility]) I have reviewed documentation and recommendation from cardiologist She is not symptomatic I recommend repeat imaging study next week If she have residual disease or progression of disease, I will have to put her back on chemotherapy She is in agreement  Metastasis to lung [facility]) She has recurrent cough The patient has been smoking We discussed importance of nicotine cessation I plan to repeat imaging study as above  Coronary artery disease She has multiple cardiovascular risk factors She is no longer taking trastuzumab and she has no signs or symptoms to suggest heart failure She will continue medical management and cardiology review as well as echocardiogram as scheduled  CKD (chronic kidney disease), stage III ([place]) She has intermittent acute on chronic renal failure We discussed importance of hydration and risk factor modification  Orders Placed This Encounter  Procedures   CT CHEST ABDOMEN PELVIS W CONTRAST    Standing Status:   Future    Standing Expiration Date:   07/14/2023    Order Specific Question:   Preferred imaging location?    Answer:   [facility]    Order Specific Question:   Radiology Contrast Protocol - do NOT remove file path    Answer:   [email or web address]    Order Specific Question:   Is patient pregnant?    Answer:   No    All questions were answered. The patient knows to call the clinic with any problems, questions or concerns. The total time spent in the appointment was 30 minutes encounter with patients including review of chart and various tests results, discussions about plan of care and coordination of care  plan   Heath Lark, MD 07/13/2022 12:44 PM  INTERVAL HISTORY: Please see below for problem oriented charting. she returns for surveillance follow-up She has been receiving treatment for metastatic uterine cancer until she was found to have cardiomyopathy She denies signs or symptoms of congestive heart failure She had recent cough, nonproductive She started smoking again Denies new bone pain Denies abdominal pain or changes in bowel habits  REVIEW OF SYSTEMS:   Constitutional: Denies fevers, chills or abnormal weight loss Eyes: Denies blurriness of vision Ears, nose, mouth, throat, and face: Denies mucositis or sore throat Cardiovascular: Denies palpitation, chest discomfort or lower extremity swelling Gastrointestinal:  Denies nausea, heartburn or change in bowel habits Skin: Denies abnormal skin rashes Lymphatics: Denies new lymphadenopathy or easy bruising Neurological:Denies numbness, tingling or new weaknesses Behavioral/Psych: Mood is stable, no new changes  All other systems were reviewed with the patient and are negative.  I have reviewed the past medical history, past surgical history, social history and family history with the patient and they are unchanged from previous note.  ALLERGIES:  is allergic to statins, agrostis alba pollen extract [gramineae pollens], and zetia [ezetimibe].  MEDICATIONS:  Current Outpatient Medications  Medication Sig Dispense Refill   acetaminophen (TYLENOL) 500 MG tablet Take 1,000 mg by mouth every 6 (six) hours as needed for mild pain or headache.     Evolocumab (REPATHA SURECLICK) 557 MG/ML SOAJ Inject 140 mg into the skin every 14 (fourteen) days. 2 pen 11   lidocaine-prilocaine (EMLA) cream Apply 1 application topically as needed. 30 g 0   loratadine (CLARITIN)  10 MG tablet Take 1 tablet (10 mg total) by mouth daily as needed for allergies. 30 tablet 11   losartan (COZAAR) 25 MG tablet Take 0.5 tablets (12.5 mg total) by mouth daily.  45 tablet 3   metFORMIN (GLUCOPHAGE) 1000 MG tablet Take 1,000 mg by mouth 2 (two) times daily with a meal.     metoprolol succinate (TOPROL XL) 25 MG 24 hr tablet Take 1 tablet (25 mg total) by mouth daily. 90 tablet 3   nitroGLYCERIN (NITROSTAT) 0.4 MG SL tablet Place 1 tablet (0.4 mg total) under the tongue every 5 (five) minutes as needed for chest pain. 30 tablet 2   ondansetron (ZOFRAN) 8 MG tablet Take 1 tablet (8 mg total) by mouth every 8 (eight) hours as needed for nausea. 30 tablet 3   polyethylene glycol (MIRALAX) 17 g packet Take 17 g by mouth daily. 30 each 1   prochlorperazine (COMPAZINE) 10 MG tablet Take 1 tablet (10 mg total) by mouth every 6 (six) hours as needed for nausea or vomiting. 30 tablet 0   senna-docusate (SENOKOT-S) 8.6-50 MG tablet Take 2 tablets by mouth 2 (two) times daily. 90 tablet 1   spironolactone (ALDACTONE) 25 MG tablet Take 0.5 tablets (12.5 mg total) by mouth daily. 45 tablet 3   No current facility-administered medications for this visit.    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    12/14/2020 Imaging   CT chest abdomen and pelvis IMPRESSION:  1.  2 small nodules in the right lung, nonspecific.  2.  Prominent precarinal lymph node versus adjacent small nodes.  3.  Enlarged heterogeneous uterine fundus.  4.  No definite adenopathy in the abdomen or pelvis. Nonspecific small lymph nodes are present, not enlarged by CT criteria.  5.  Mild wall thickening of the anterior aspect of the urinary bladder which may be due to to nondistention or inflammation.    12/27/2020 Pathology Results   SPECIMEN     Procedure:    Total hysterectomy and bilateral salpingo-oophorectomy   TUMOR     Histologic Type:    Serous  carcinoma     Histologic Grade:    Not applicable     Myometrial Invasion:    Present       Depth of Myometrial Invasion:    11 mm       Myometrial Thickness:    12 mm       Percentage of Myometrial Invasion:    92 %     Uterine Serosa Involvement:    Not identified     Cervical Stromal Involvement:    Not identified     Other Tissue / Organ Involvement:    Not identified     Peritoneal / Ascitic Fluid:    Not identified     Lymphovascular Invasion (LVI):    Present   MARGINS     Margin Status:    Not applicable   REGIONAL LYMPH NODES     Regional Lymph Node Status:           :    All regional lymph nodes negative for tumor cells         Lymph Nodes Examined:  Total Number of Pelvic Nodes Examined:    3           Number of Pelvic Sentinel Nodes Examined:    3           Total Number of Para-aortic Nodes Examined:    0           Number of Para-aortic Sentinel Nodes Examined:    Not applicable   DISTANT METASTASIS     Distant Site(s) Involved:    Not applicable   PATHOLOGIC STAGE CLASSIFICATION (pTNM, AJCC 8th Edition)     Reporting of pT, pN, and (when applicable) pM categories is based on information available to the pathologist at the time the report is issued. As per the AJCC (Chapter 1, 8th Ed.) it is the managing physician's responsibility to establish the final pathologic stage based upon all pertinent information, including but potentially not limited to this pathology report.     TNM Descriptors:    Not applicable     Tumor Modifier:    Not applicable     pT Category:    pT1b     Regional Lymph Nodes Modifier:    (sn)     pN Category:    pN0   The endometrial adenocarcinoma was analyzed by IHC for DNA mismatch repair proteins.  The neoplasm retained nuclear expression of all four mismatch repair proteins, MLH1, PMS2, MSH2, MSH6.    Estrogen receptors (ER): Negative Progesterone receptors (PR): Negative  Controls worked appropriately.   A HER2 stain is  POSITIVE (3+).   12/27/2020 Surgery   At [facility], she underwent a robotic hysterectomy/BSO, sentinel node dissection and partial omentectomy. She was found to have an enlarged uterus. An enlarged right pelvic sentinel node.    02/23/2021 Echocardiogram   Left Ventricle: Systolic function is normal. EF: 55-60%.    Left Ventricle: Doppler parameters consistent with mild diastolic  dysfunction and low to normal LA pressure.    Left Ventricle: There is mild concentric hypertrophy.    Aortic Valve: Mild aortic valve regurgitation.   01/27/2022 Imaging   CT chest imaging 1. No pulmonary embolus. 2. Findings consistent with intrathoracic malignancy. Multiple pulmonary nodules throughout both lungs of varying sizes, consistent with metastatic disease. Primary site of malignancy may represent a 4 cm spiculated nodule in the right perihilar region spanning the fissure that is cavitating. Alternatively all of these nodules may be metastatic given history of cancer, type not specified.  3. Multifocal mediastinal and bilateral hilar adenopathy, consistent metastatic disease. 4. Nondisplaced left anterior seventh and eighth rib fractures, soft tissue thickening adjacent to the seventh rib fracture suggests this may be pathologic 5. Right hydronephrosis is partially included in the field of view in the upper abdomen. Small soft tissue density medial to the right hepatic lobe posterior to the right kidney is nonspecific but may represent metastatic disease. Recommend staging CT of the abdomen and pelvis with oral and IV contrast. 6. Moderate emphysema. 7. Aortic atherosclerosis.  Coronary artery calcifications.   01/28/2022 Initial Diagnosis   Endometrial cancer ([place])   01/28/2022 Cancer Staging   Staging form: Corpus Uteri - Carcinoma and Carcinosarcoma, AJCC 8th Edition - Clinical stage from 01/28/2022: FIGO Stage IVB (rcT1b, cN2a, cM1) - Signed by Heath Lark, MD on 01/28/2022 Stage prefix: Recurrence    01/28/2022 Imaging   CT abdomen and pelvis 1. Multiple abdominal and pelvic metastatic implants as above. 2. Moderate right hydronephrosis secondary to mass effect and obstruction of the distal right ureter  by a right pelvic metastatic implant. 3. Multiple pulmonary metastatic disease in the visualized right lung base. 4. No bowel obstruction. Normal appendix. 5. Aortic Atherosclerosis (ICD10-I70.0).   01/30/2022 Echocardiogram    1. Left ventricular ejection fraction, by estimation, is 55 to 60%. The left ventricle has normal function. The left ventricle has no regional wall motion abnormalities. There is moderate concentric left ventricular  hypertrophy. Left ventricular diastolic parameters are consistent with Grade I diastolic dysfunction (impaired relaxation).   2. Right ventricular systolic function is normal. The right ventricular size is normal. There is normal pulmonary artery systolic pressure.   3. Left atrial size was mildly dilated.   4. The mitral valve is normal in structure. Trivial mitral valve regurgitation. No evidence of mitral stenosis.   5. The aortic valve is tricuspid. Aortic valve regurgitation is trivial. No aortic stenosis is present.   6. Aortic dilatation noted. There is mild dilatation of the aortic root, measuring 39 mm.   7. The inferior vena cava is normal in size with <50% respiratory variability, suggesting right atrial pressure of 8 mmHg.    01/31/2022 - 04/04/2022 Chemotherapy   Patient is on Treatment Plan : UTERINE SEROUS CARCINOMA Carboplatin + Paclitaxel + Trastuzumab q21d x 6 Cycles / Trastuzumab q21d     01/31/2022 - 05/18/2022 Chemotherapy   Patient is on Treatment Plan : UTERINE SEROUS CARCINOMA Carboplatin + Paclitaxel + Trastuzumab q21d x 6 Cycles / Trastuzumab q21d     04/03/2022 Imaging   1. Pulmonary nodules are either reduced in size or completely resolved. No new nodules. 2. Marked reduction in size peritoneal nodular metastasis. Multiple  nodular lesions are no longer measurable. 3. Interval reduction in size of implant in the RIGHT operator space. While this implant is smaller, lesion continues to obstruct the RIGHT ureter with persistent RIGHT hydronephrosis and hydroureter. 4. No new metastatic lesions are present.   05/09/2022 Echocardiogram    1. Left ventricular ejection fraction, by estimation, is 45 to 50%. The left ventricle has mildly decreased function. The left ventricle demonstrates global hypokinesis. There is moderate concentric left ventricular hypertrophy. Left ventricular diastolic parameters are consistent with Grade I diastolic dysfunction (impaired relaxation). The average left ventricular global longitudinal strain is -10.8 %. The global longitudinal strain is abnormal.  2. Right ventricular systolic function is normal. The right ventricular size is normal.  3. The mitral valve is normal in structure. No evidence of mitral valve regurgitation.  4. The aortic valve is tricuspid. Aortic valve regurgitation is mild.  5. There is mild dilatation of the ascending aorta, measuring 39 mm.  6. The inferior vena cava is normal in size with <50% respiratory variability, suggesting right atrial pressure of 8 mmHg.     PHYSICAL EXAMINATION: ECOG PERFORMANCE STATUS: 1 - Symptomatic but completely ambulatory  Vitals:   07/13/22 1045  BP: (!) 145/85  Pulse: 64  Resp: 18  Temp: 97.7 F (36.5 C)  SpO2: 100%   Filed Weights   07/13/22 1045  Weight: 145 lb 3.2 oz (65.9 kg)    GENERAL:alert, no distress and comfortable NEURO: alert & oriented x 3 with fluent speech, no focal motor/sensory deficits  LABORATORY DATA:  I have reviewed the data as listed    Component Value Date/Time   NA 139 07/13/2022 1030   NA 141 02/25/2019 1222   K 3.7 07/13/2022 1030   CL 103 07/13/2022 1030   CO2 31 07/13/2022 1030   GLUCOSE 86 07/13/2022 1030   BUN 27 (  H) 07/13/2022 1030   BUN 17 02/25/2019 1222   CREATININE 1.69 (H)  07/13/2022 1030   CREATININE 1.56 (H) 05/18/2022 0910   CALCIUM 9.4 07/13/2022 1030   PROT 7.8 07/13/2022 1030   PROT 6.9 09/22/2019 1419   ALBUMIN 4.0 07/13/2022 1030   ALBUMIN 3.9 09/22/2019 1419   AST 15 07/13/2022 1030   AST 21 05/18/2022 0910   ALT 9 07/13/2022 1030   ALT 16 05/18/2022 0910   ALKPHOS 55 07/13/2022 1030   BILITOT 0.5 07/13/2022 1030   BILITOT 0.3 05/18/2022 0910   GFRNONAA 35 (L) 07/13/2022 1030   GFRNONAA 38 (L) 05/18/2022 0910   GFRAA >60 05/16/2019 0335    No results found for: "SPEP", "UPEP"  Lab Results  Component Value Date   WBC 6.1 07/13/2022   NEUTROABS 3.2 07/13/2022   HGB 10.9 (L) 07/13/2022   HCT 33.4 (L) 07/13/2022   MCV 87.2 07/13/2022   PLT 240 07/13/2022      Chemistry      Component Value Date/Time   NA 139 07/13/2022 1030   NA 141 02/25/2019 1222   K 3.7 07/13/2022 1030   CL 103 07/13/2022 1030   CO2 31 07/13/2022 1030   BUN 27 (H) 07/13/2022 1030   BUN 17 02/25/2019 1222   CREATININE 1.69 (H) 07/13/2022 1030   CREATININE 1.56 (H) 05/18/2022 0910      Component Value Date/Time   CALCIUM 9.4 07/13/2022 1030   ALKPHOS 55 07/13/2022 1030   AST 15 07/13/2022 1030   AST 21 05/18/2022 0910   ALT 9 07/13/2022 1030   ALT 16 05/18/2022 0910   BILITOT 0.5 07/13/2022 1030   BILITOT 0.3 05/18/2022 0910

## 2022-07-13 NOTE — Assessment & Plan Note (Signed)
She has recurrent cough The patient has been smoking We discussed importance of nicotine cessation I plan to repeat imaging study as above

## 2022-07-18 ENCOUNTER — Ambulatory Visit (HOSPITAL_COMMUNITY)
Admission: RE | Admit: 2022-07-18 | Discharge: 2022-07-18 | Disposition: A | Discharge status: Home / Self Care | Payer: Medicare Other | Source: Ambulatory Visit | Attending: Cardiology | Admitting: Cardiology

## 2022-07-18 DIAGNOSIS — I119 Hypertensive heart disease without heart failure: Secondary | ICD-10-CM | POA: Insufficient documentation

## 2022-07-18 DIAGNOSIS — R55 Syncope and collapse: Secondary | ICD-10-CM | POA: Diagnosis not present

## 2022-07-18 DIAGNOSIS — I252 Old myocardial infarction: Secondary | ICD-10-CM | POA: Diagnosis not present

## 2022-07-18 DIAGNOSIS — I427 Cardiomyopathy due to drug and external agent: Secondary | ICD-10-CM | POA: Diagnosis not present

## 2022-07-18 DIAGNOSIS — I351 Nonrheumatic aortic (valve) insufficiency: Secondary | ICD-10-CM | POA: Diagnosis not present

## 2022-07-18 DIAGNOSIS — E119 Type 2 diabetes mellitus without complications: Secondary | ICD-10-CM | POA: Insufficient documentation

## 2022-07-18 DIAGNOSIS — Z0189 Encounter for other specified special examinations: Secondary | ICD-10-CM

## 2022-07-18 DIAGNOSIS — R0600 Dyspnea, unspecified: Secondary | ICD-10-CM | POA: Diagnosis not present

## 2022-07-18 DIAGNOSIS — F17200 Nicotine dependence, unspecified, uncomplicated: Secondary | ICD-10-CM | POA: Diagnosis not present

## 2022-07-18 DIAGNOSIS — I429 Cardiomyopathy, unspecified: Secondary | ICD-10-CM | POA: Diagnosis not present

## 2022-07-18 LAB — ECHOCARDIOGRAM COMPLETE
Area-P 1/2: 2.74 cm2
Calc EF: 50.2 %
P 1/2 time: 530 msec
S' Lateral: 2.8 cm
Single Plane A2C EF: 49.8 %
Single Plane A4C EF: 50.8 %

## 2022-07-18 NOTE — Progress Notes (Incomplete)
  Echocardiogram 2D Echocardiogram has been performed.  Krista [name] 07/18/2022, 11:53 AM

## 2022-07-21 ENCOUNTER — Ambulatory Visit (HOSPITAL_COMMUNITY)
Admission: RE | Admit: 2022-07-21 | Discharge: 2022-07-21 | Disposition: A | Discharge status: Home / Self Care | Payer: Medicare Other | Source: Ambulatory Visit | Attending: Hematology and Oncology | Admitting: Hematology and Oncology

## 2022-07-21 ENCOUNTER — Encounter (HOSPITAL_COMMUNITY): Payer: Self-pay

## 2022-07-21 DIAGNOSIS — C541 Malignant neoplasm of endometrium: Secondary | ICD-10-CM | POA: Diagnosis present

## 2022-07-21 MED ORDER — SODIUM CHLORIDE (PF) 0.9 % IJ SOLN
INTRAMUSCULAR | Status: AC
  Filled 2022-07-21: qty 50

## 2022-07-21 MED ORDER — IOHEXOL 300 MG/ML  SOLN
75.0000 mL | Freq: Once | INTRAMUSCULAR | Status: AC | PRN
  Administered 2022-07-21: 75 mL via INTRAVENOUS

## 2022-07-24 NOTE — Assessment & Plan Note (Signed)
I have reviewed multiple CT imaging with the patient and her mother So far, she have no signs of cancer recurrence/progression We will continue supportive care only She is at high risk of cancer relapse I plan to monitor with serial imaging study every 3 months for the first year after discontinuation of treatment

## 2022-07-24 NOTE — Assessment & Plan Note (Signed)
She has intermittent acute on chronic renal failure We discussed importance of hydration and risk factor modification

## 2022-07-25 IMAGING — CT CT ABD-PELV W/ CM
2 of 5 series · 15 of 46 positions shown, 17 images · IV contrast (agent unspecified)
Comparison: CT abdomen pelvis dated 09/22/2020. chest CT dated
01/27/2022.

CLINICAL DATA: Evaluate for metastatic disease.

EXAM:
CT ABDOMEN AND PELVIS WITH CONTRAST
TECHNIQUE: Multidetector CT imaging of the abdomen and pelvis was performed
using the standard protocol following bolus administration of
intravenous contrast.

[Series 3: abdomen 5.0 · axial · 0.77mm/px · z∈[-414,-54]mm · 12 of 84 slices shown, 14 images]
[im 6/84  soft-tissue]
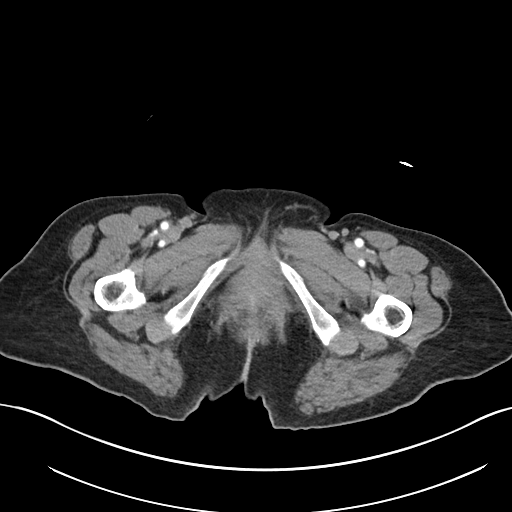
[im 6/84  bone]
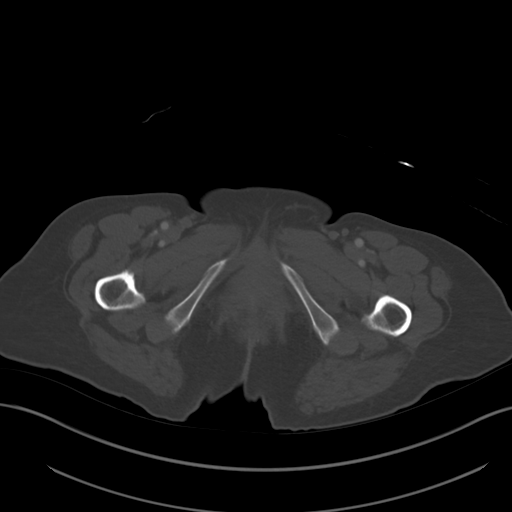
[im 11/84  soft-tissue]
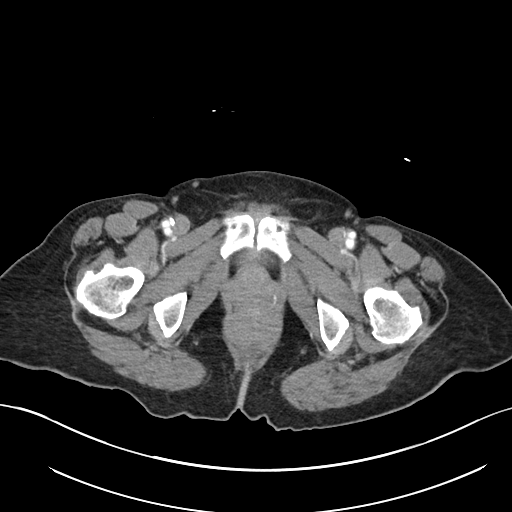
[im 21/84  soft-tissue]
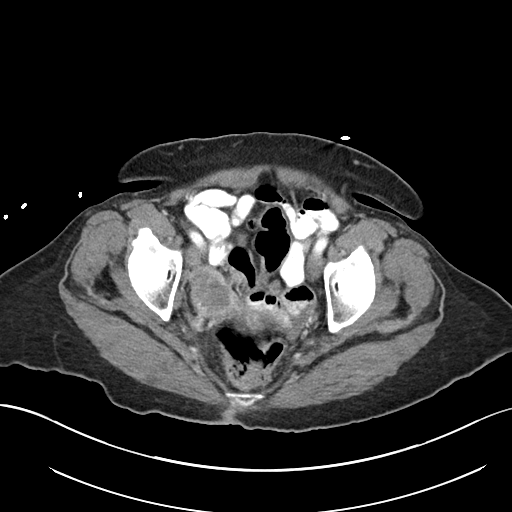
[im 26/84  soft-tissue]
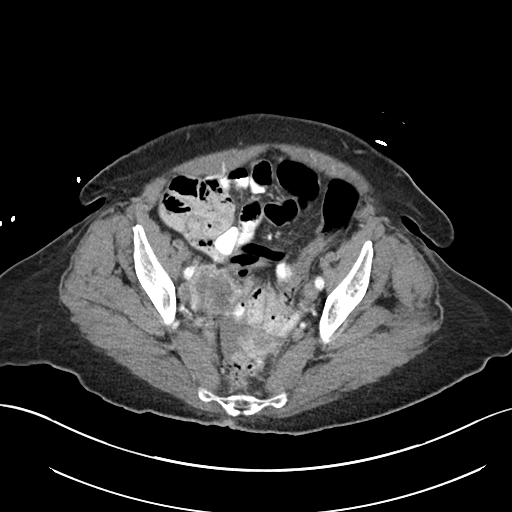
[im 32/84  soft-tissue]
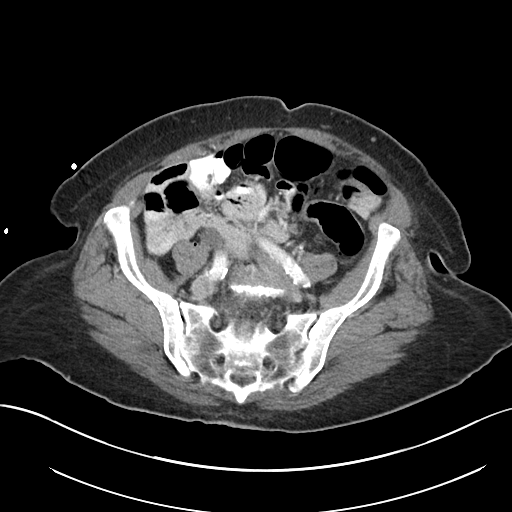
[im 37/84  soft-tissue]
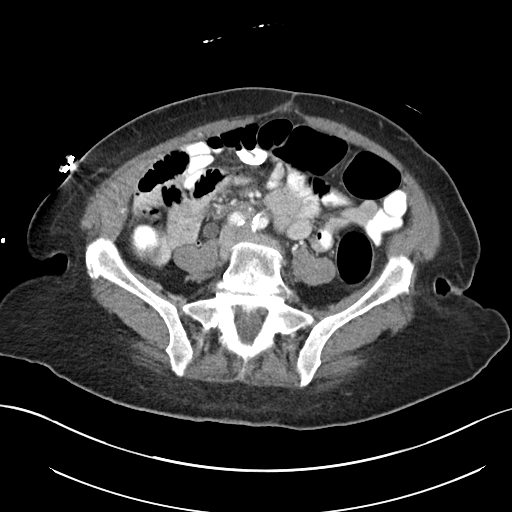
[im 47/84  soft-tissue]
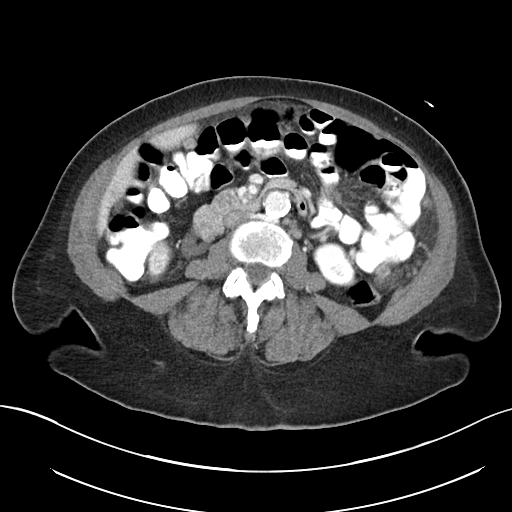
[im 52/84  soft-tissue]
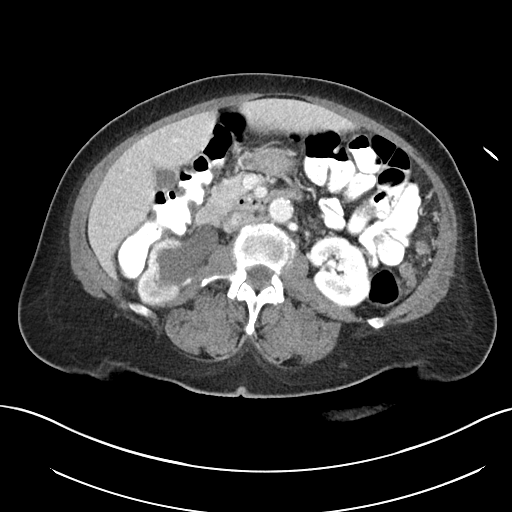
[im 58/84  soft-tissue]
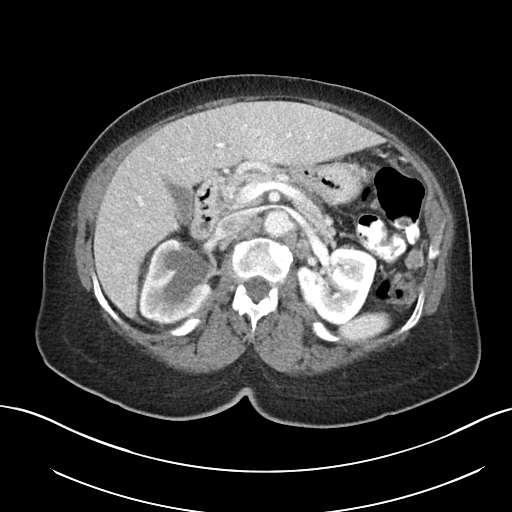
[im 58/84  bone]
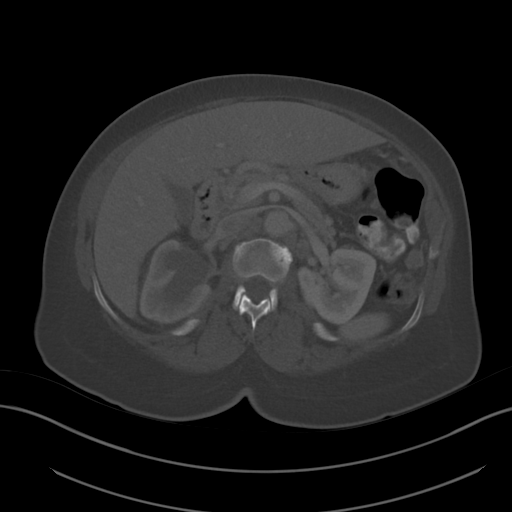
[im 63/84  soft-tissue]
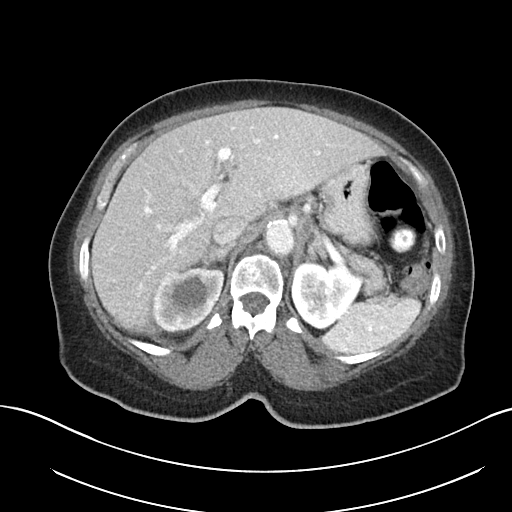
[im 73/84  soft-tissue]
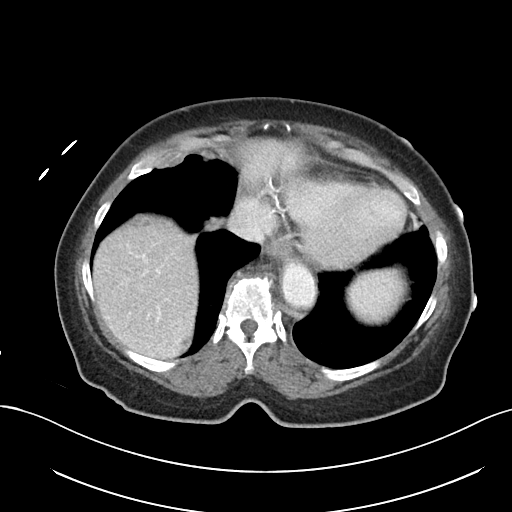
[im 78/84  soft-tissue]
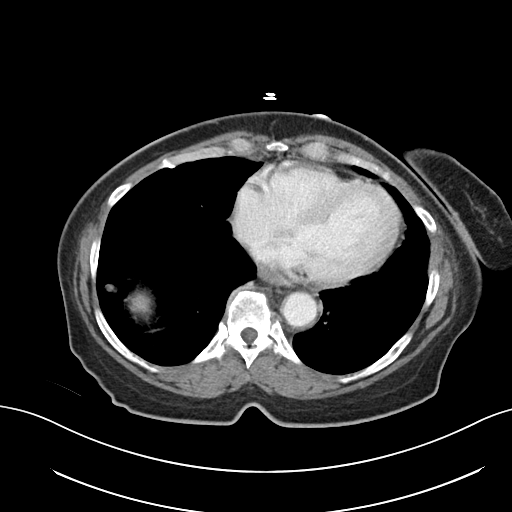

[Series 6: abdomen 3.0 mpr cor · coronal · 0.84mm/px · 3 of 89 slices shown]
[im 30/89  soft-tissue]
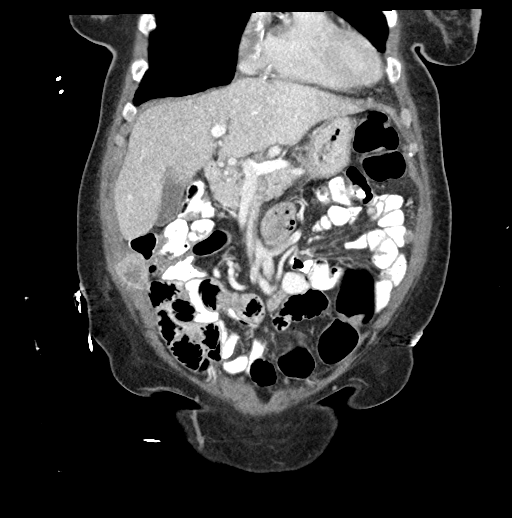
[im 40/89  soft-tissue]
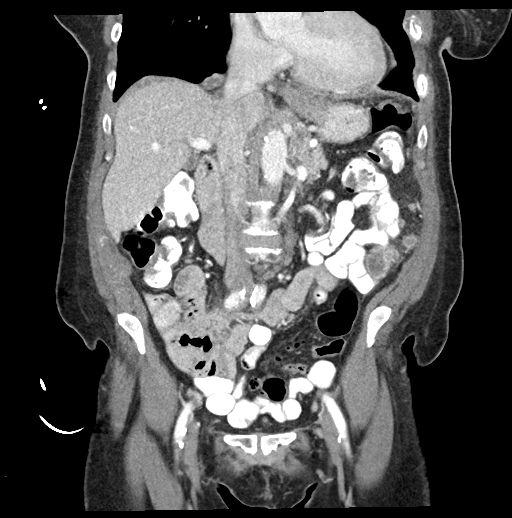
[im 49/89  soft-tissue]
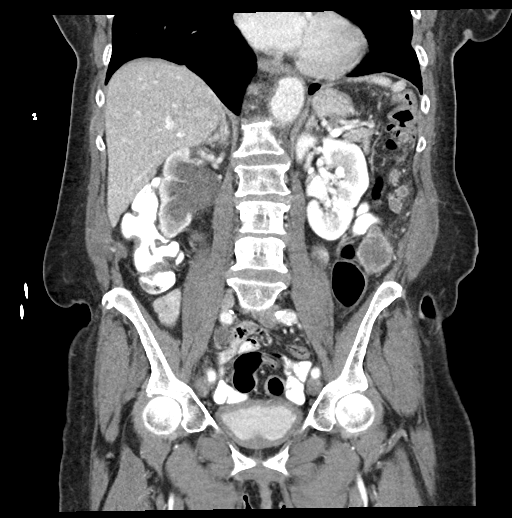

[15 of 46 positions shown; findings below may reference images not displayed]

RADIATION DOSE REDUCTION: This exam was performed according to the
departmental dose-optimization program which includes automated
exposure control, adjustment of the mA and/or kV according to
patient size and/or use of iterative reconstruction technique.

CONTRAST:  100mL OMNIPAQUE IOHEXOL 300 MG/ML  SOLN
FINDINGS: Lower chest: Multiple pulmonary nodules in the visualized right lung
base consistent with metastatic disease. Partially visualized right
perihilar nodular densities ([DATE]) corresponding to the right hilar
mass or lymphadenopathy. These findings are better evaluated on the
chest CT of 01/27/2022.

No intra-abdominal free air.  Small free fluid in the pelvis.

Hepatobiliary: Faint small nodular deposit along the liver capsule
in the right lobe of the liver ([DATE]) measuring up to 12 mm most
consistent with metastatic implant. No intrahepatic biliary ductal
dilatation. The gallbladder is unremarkable.

Pancreas: The pancreas is unremarkable. No active inflammatory
changes. No dilatation of the main pancreatic duct.

Spleen: The spleen is unremarkable.

Adrenals/Urinary Tract: The adrenal glands are unremarkable. There
is moderate right hydronephrosis and mild right hydroureter
secondary to compression and obstruction of the distal right ureter
by a metastatic lesion in the right hemipelvis. The left kidney is
unremarkable. The urinary bladder is minimally distended and grossly
unremarkable.

Stomach/Bowel: There is no bowel obstruction or active inflammation.
The appendix is normal.

Vascular/Lymphatic: Moderate aortoiliac atherosclerotic disease. The
IVC is unremarkable. No portal venous gas. No retroperitoneal
adenopathy.

Reproductive: Hysterectomy.

Other: Several pelvic masses consistent with metastatic disease. The
masses demonstrate central low attenuation, likely necrotic tissue.
The largest measuring 4.4 x 3.5 x 4.7 cm in the right hemipelvis.
This mass causes compression of the distal right ureter with
associated right-sided hydronephrosis. Additional metastatic
implants in the omentum and in the left upper abdomen measure up to
4.3 x 3.3 cm in axial dimensions (42/3). There is a 4.5 x 2.3 cm
metastatic implant in the right lateral abdominal wall musculature
(45/3). Faint 1.4 x 1.0 cm nodular density over the left
diaphragmatic crus ([DATE]) suspicious for metastatic implant.

Musculoskeletal: There is osteopenia with degenerative changes of
the spine. No acute osseous pathology. No suspicious bone lesions.
IMPRESSION: 1. Multiple abdominal and pelvic metastatic implants as above.
2. Moderate right hydronephrosis secondary to mass effect and
obstruction of the distal right ureter by a right pelvic metastatic
implant.
3. Multiple pulmonary metastatic disease in the visualized right
lung base.
4. No bowel obstruction. Normal appendix.
5. Aortic Atherosclerosis ([id number].P).

## 2022-07-27 ENCOUNTER — Encounter: Payer: Self-pay | Admitting: Hematology and Oncology

## 2022-07-27 ENCOUNTER — Other Ambulatory Visit: Payer: Self-pay

## 2022-07-27 ENCOUNTER — Inpatient Hospital Stay: Payer: Medicare Other | Attending: Hematology and Oncology | Admitting: Hematology and Oncology

## 2022-07-27 VITALS — BP 153/94 | HR 78 | Resp 18 | Ht 63.0 in | Wt 146.8 lb

## 2022-07-27 DIAGNOSIS — Z79899 Other long term (current) drug therapy: Secondary | ICD-10-CM | POA: Diagnosis not present

## 2022-07-27 DIAGNOSIS — C541 Malignant neoplasm of endometrium: Secondary | ICD-10-CM | POA: Insufficient documentation

## 2022-07-27 DIAGNOSIS — I2510 Atherosclerotic heart disease of native coronary artery without angina pectoris: Secondary | ICD-10-CM | POA: Diagnosis not present

## 2022-07-27 DIAGNOSIS — N183 Chronic kidney disease, stage 3 unspecified: Secondary | ICD-10-CM | POA: Insufficient documentation

## 2022-07-27 DIAGNOSIS — N1330 Unspecified hydronephrosis: Secondary | ICD-10-CM | POA: Diagnosis not present

## 2022-07-27 NOTE — Assessment & Plan Note (Signed)
The patient have multiple cardiovascular risk factors Repeat echocardiogram from a week ago show stable cardiomyopathy She is not symptomatic I have verified that the patient is taking all other medications as directed by cardiologist She has appointment to see cardiologist in February I would defer to them for further management

## 2022-07-27 NOTE — Assessment & Plan Note (Signed)
This is stable Her renal function is stable Observe closely

## 2022-07-27 NOTE — Progress Notes (Signed)
[place] OFFICE PROGRESS NOTE  Patient Care Team: Drue Flirt, MD as PCP - General (Family Medicine) Lorretta Harp, MD as PCP - Cardiology (Cardiology) Heath Lark, MD as Consulting Physician (Hematology and Oncology)  ASSESSMENT & PLAN:  Endometrial cancer [facility]) I have reviewed multiple CT imaging with the patient and her mother So far, she have no signs of cancer recurrence/progression We will continue supportive care only She is at high risk of cancer relapse I plan to monitor with serial imaging study every 3 months for the first year after discontinuation of treatment  CKD (chronic kidney disease), stage III ([place]) She has intermittent acute on chronic renal failure We discussed importance of hydration and risk factor modification  Coronary artery disease The patient have multiple cardiovascular risk factors Repeat echocardiogram from a week ago show stable cardiomyopathy She is not symptomatic I have verified that the patient is taking all other medications as directed by cardiologist She has appointment to see cardiologist in February I would defer to them for further management  Hydronephrosis of right kidney This is stable Her renal function is stable Observe closely  Orders Placed This Encounter  Procedures   CT CHEST ABDOMEN PELVIS W CONTRAST    Standing Status:   Future    Standing Expiration Date:   07/28/2023    Order Specific Question:   Preferred imaging location?    Answer:   [facility]    Order Specific Question:   Radiology Contrast Protocol - do NOT remove file path    Answer:   [email or web address]    Order Specific Question:   Is patient pregnant?    Answer:   No    All questions were answered. The patient knows to call the clinic with any problems, questions or concerns. The total time spent in the appointment was 30 minutes encounter with patients including review of chart  and various tests results, discussions about plan of care and coordination of care plan   Heath Lark, MD 07/27/2022 12:04 PM  INTERVAL HISTORY: Please see below for problem oriented charting. she returns for treatment follow-up with her mother We reviewed test results together She has no recent signs or symptoms of congestive heart failure She denies recent smoking No recent changes in bowel habits I have reviewed her recent echocardiogram report as well as recommendation by cardiologist  REVIEW OF SYSTEMS:   Constitutional: Denies fevers, chills or abnormal weight loss Eyes: Denies blurriness of vision Ears, nose, mouth, throat, and face: Denies mucositis or sore throat Respiratory: Denies cough, dyspnea or wheezes Cardiovascular: Denies palpitation, chest discomfort or lower extremity swelling Gastrointestinal:  Denies nausea, heartburn or change in bowel habits Skin: Denies abnormal skin rashes Lymphatics: Denies new lymphadenopathy or easy bruising Neurological:Denies numbness, tingling or new weaknesses Behavioral/Psych: Mood is stable, no new changes  All other systems were reviewed with the patient and are negative.  I have reviewed the past medical history, past surgical history, social history and family history with the patient and they are unchanged from previous note.  ALLERGIES:  is allergic to statins, agrostis alba pollen extract [gramineae pollens], and zetia [ezetimibe].  MEDICATIONS:  Current Outpatient Medications  Medication Sig Dispense Refill   acetaminophen (TYLENOL) 500 MG tablet Take 1,000 mg by mouth every 6 (six) hours as needed for mild pain or headache.     Evolocumab (REPATHA SURECLICK) 169 MG/ML SOAJ Inject 140 mg into the skin every 14 (fourteen) days. 2 pen 11  lidocaine-prilocaine (EMLA) cream Apply 1 application topically as needed. 30 g 0   loratadine (CLARITIN) 10 MG tablet Take 1 tablet (10 mg total) by mouth daily as needed for allergies.  30 tablet 11   losartan (COZAAR) 25 MG tablet Take 0.5 tablets (12.5 mg total) by mouth daily. 45 tablet 3   metFORMIN (GLUCOPHAGE) 1000 MG tablet Take 1,000 mg by mouth 2 (two) times daily with a meal.     metoprolol succinate (TOPROL XL) 25 MG 24 hr tablet Take 1 tablet (25 mg total) by mouth daily. 90 tablet 3   nitroGLYCERIN (NITROSTAT) 0.4 MG SL tablet Place 1 tablet (0.4 mg total) under the tongue every 5 (five) minutes as needed for chest pain. 30 tablet 2   ondansetron (ZOFRAN) 8 MG tablet Take 1 tablet (8 mg total) by mouth every 8 (eight) hours as needed for nausea. 30 tablet 3   polyethylene glycol (MIRALAX) 17 g packet Take 17 g by mouth daily. 30 each 1   prochlorperazine (COMPAZINE) 10 MG tablet Take 1 tablet (10 mg total) by mouth every 6 (six) hours as needed for nausea or vomiting. 30 tablet 0   senna-docusate (SENOKOT-S) 8.6-50 MG tablet Take 2 tablets by mouth 2 (two) times daily. 90 tablet 1   spironolactone (ALDACTONE) 25 MG tablet Take 0.5 tablets (12.5 mg total) by mouth daily. 45 tablet 3   No current facility-administered medications for this visit.    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    12/14/2020 Imaging   CT chest abdomen and pelvis IMPRESSION:  1.  2 small nodules in the right lung, nonspecific.  2.  Prominent precarinal lymph node versus adjacent small nodes.  3.  Enlarged heterogeneous uterine fundus.  4.  No definite adenopathy in the abdomen or pelvis. Nonspecific small lymph nodes are present, not enlarged by CT criteria.  5.  Mild wall thickening of the anterior aspect of the urinary bladder which may be due to to nondistention or inflammation.    12/27/2020 Pathology Results   SPECIMEN     Procedure:    Total  hysterectomy and bilateral salpingo-oophorectomy   TUMOR     Histologic Type:    Serous carcinoma     Histologic Grade:    Not applicable     Myometrial Invasion:    Present       Depth of Myometrial Invasion:    11 mm       Myometrial Thickness:    12 mm       Percentage of Myometrial Invasion:    92 %     Uterine Serosa Involvement:    Not identified     Cervical Stromal Involvement:    Not identified     Other Tissue / Organ Involvement:    Not identified     Peritoneal / Ascitic Fluid:    Not identified     Lymphovascular Invasion (LVI):    Present   MARGINS     Margin Status:    Not applicable   REGIONAL LYMPH NODES     Regional Lymph Node Status:           :    All regional lymph nodes negative for tumor cells  Lymph Nodes Examined:               Total Number of Pelvic Nodes Examined:    3           Number of Pelvic Sentinel Nodes Examined:    3           Total Number of Para-aortic Nodes Examined:    0           Number of Para-aortic Sentinel Nodes Examined:    Not applicable   DISTANT METASTASIS     Distant Site(s) Involved:    Not applicable   PATHOLOGIC STAGE CLASSIFICATION (pTNM, AJCC 8th Edition)     Reporting of pT, pN, and (when applicable) pM categories is based on information available to the pathologist at the time the report is issued. As per the AJCC (Chapter 1, 8th Ed.) it is the managing physician's responsibility to establish the final pathologic stage based upon all pertinent information, including but potentially not limited to this pathology report.     TNM Descriptors:    Not applicable     Tumor Modifier:    Not applicable     pT Category:    pT1b     Regional Lymph Nodes Modifier:    (sn)     pN Category:    pN0   The endometrial adenocarcinoma was analyzed by IHC for DNA mismatch repair proteins.  The neoplasm retained nuclear expression of all four mismatch repair proteins, MLH1, PMS2, MSH2, MSH6.    Estrogen receptors (ER):  Negative Progesterone receptors (PR): Negative  Controls worked appropriately.   A HER2 stain is POSITIVE (3+).   12/27/2020 Surgery   At [facility], she underwent a robotic hysterectomy/BSO, sentinel node dissection and partial omentectomy. She was found to have an enlarged uterus. An enlarged right pelvic sentinel node.    02/23/2021 Echocardiogram   Left Ventricle: Systolic function is normal. EF: 55-60%.    Left Ventricle: Doppler parameters consistent with mild diastolic  dysfunction and low to normal LA pressure.    Left Ventricle: There is mild concentric hypertrophy.    Aortic Valve: Mild aortic valve regurgitation.   01/27/2022 Imaging   CT chest imaging 1. No pulmonary embolus. 2. Findings consistent with intrathoracic malignancy. Multiple pulmonary nodules throughout both lungs of varying sizes, consistent with metastatic disease. Primary site of malignancy may represent a 4 cm spiculated nodule in the right perihilar region spanning the fissure that is cavitating. Alternatively all of these nodules may be metastatic given history of cancer, type not specified.  3. Multifocal mediastinal and bilateral hilar adenopathy, consistent metastatic disease. 4. Nondisplaced left anterior seventh and eighth rib fractures, soft tissue thickening adjacent to the seventh rib fracture suggests this may be pathologic 5. Right hydronephrosis is partially included in the field of view in the upper abdomen. Small soft tissue density medial to the right hepatic lobe posterior to the right kidney is nonspecific but may represent metastatic disease. Recommend staging CT of the abdomen and pelvis with oral and IV contrast. 6. Moderate emphysema. 7. Aortic atherosclerosis.  Coronary artery calcifications.   01/28/2022 Initial Diagnosis   Endometrial cancer ([place])   01/28/2022 Cancer Staging   Staging form: Corpus Uteri - Carcinoma and Carcinosarcoma, AJCC 8th Edition - Clinical stage from 01/28/2022: FIGO  Stage IVB (rcT1b, cN2a, cM1) - Signed by Heath Lark, MD on 01/28/2022 Stage prefix: Recurrence   01/28/2022 Imaging   CT abdomen and pelvis 1. Multiple abdominal and pelvic metastatic implants  as above. 2. Moderate right hydronephrosis secondary to mass effect and obstruction of the distal right ureter by a right pelvic metastatic implant. 3. Multiple pulmonary metastatic disease in the visualized right lung base. 4. No bowel obstruction. Normal appendix. 5. Aortic Atherosclerosis (ICD10-I70.0).   01/30/2022 Echocardiogram    1. Left ventricular ejection fraction, by estimation, is 55 to 60%. The left ventricle has normal function. The left ventricle has no regional wall motion abnormalities. There is moderate concentric left ventricular  hypertrophy. Left ventricular diastolic parameters are consistent with Grade I diastolic dysfunction (impaired relaxation).   2. Right ventricular systolic function is normal. The right ventricular size is normal. There is normal pulmonary artery systolic pressure.   3. Left atrial size was mildly dilated.   4. The mitral valve is normal in structure. Trivial mitral valve regurgitation. No evidence of mitral stenosis.   5. The aortic valve is tricuspid. Aortic valve regurgitation is trivial. No aortic stenosis is present.   6. Aortic dilatation noted. There is mild dilatation of the aortic root, measuring 39 mm.   7. The inferior vena cava is normal in size with <50% respiratory variability, suggesting right atrial pressure of 8 mmHg.    01/31/2022 - 04/04/2022 Chemotherapy   Patient is on Treatment Plan : UTERINE SEROUS CARCINOMA Carboplatin + Paclitaxel + Trastuzumab q21d x 6 Cycles / Trastuzumab q21d     01/31/2022 - 05/18/2022 Chemotherapy   Patient is on Treatment Plan : UTERINE SEROUS CARCINOMA Carboplatin + Paclitaxel + Trastuzumab q21d x 6 Cycles / Trastuzumab q21d     04/03/2022 Imaging   1. Pulmonary nodules are either reduced in size or completely  resolved. No new nodules. 2. Marked reduction in size peritoneal nodular metastasis. Multiple nodular lesions are no longer measurable. 3. Interval reduction in size of implant in the RIGHT operator space. While this implant is smaller, lesion continues to obstruct the RIGHT ureter with persistent RIGHT hydronephrosis and hydroureter. 4. No new metastatic lesions are present.   05/09/2022 Echocardiogram    1. Left ventricular ejection fraction, by estimation, is 45 to 50%. The left ventricle has mildly decreased function. The left ventricle demonstrates global hypokinesis. There is moderate concentric left ventricular hypertrophy. Left ventricular diastolic parameters are consistent with Grade I diastolic dysfunction (impaired relaxation). The average left ventricular global longitudinal strain is -10.8 %. The global longitudinal strain is abnormal.  2. Right ventricular systolic function is normal. The right ventricular size is normal.  3. The mitral valve is normal in structure. No evidence of mitral valve regurgitation.  4. The aortic valve is tricuspid. Aortic valve regurgitation is mild.  5. There is mild dilatation of the ascending aorta, measuring 39 mm.  6. The inferior vena cava is normal in size with <50% respiratory variability, suggesting right atrial pressure of 8 mmHg.   07/24/2022 Imaging   1. Improving appearance of sequela of metastatic disease. RIGHT pelvic sidewall mass obstructing the RIGHT ureter is no longer clearly seen. LEFT retroperitoneal soft tissue has diminished. 2. No signs of pulmonary metastatic disease or metastatic disease to the chest. 3. Mild RIGHT hydroureter without hydronephrosis. Resolution of hydronephrosis but with further atrophy of the RIGHT kidney. 4. Aortic atherosclerosis and 3 vessel coronary artery disease.   Aortic Atherosclerosis (ICD10-I70.0).     PHYSICAL EXAMINATION: ECOG PERFORMANCE STATUS: 1 - Symptomatic but completely  ambulatory  Vitals:   07/27/22 1145  BP: (!) 153/94  Pulse: 78  Resp: 18  SpO2: 100%   Filed Weights  07/27/22 1145  Weight: 146 lb 12.8 oz (66.6 kg)    GENERAL:alert, no distress and comfortable NEURO: alert & oriented x 3 with fluent speech, no focal motor/sensory deficits  LABORATORY DATA:  I have reviewed the data as listed    Component Value Date/Time   NA 139 07/13/2022 1030   NA 141 02/25/2019 1222   K 3.7 07/13/2022 1030   CL 103 07/13/2022 1030   CO2 31 07/13/2022 1030   GLUCOSE 86 07/13/2022 1030   BUN 27 (H) 07/13/2022 1030   BUN 17 02/25/2019 1222   CREATININE 1.69 (H) 07/13/2022 1030   CREATININE 1.56 (H) 05/18/2022 0910   CALCIUM 9.4 07/13/2022 1030   PROT 7.8 07/13/2022 1030   PROT 6.9 09/22/2019 1419   ALBUMIN 4.0 07/13/2022 1030   ALBUMIN 3.9 09/22/2019 1419   AST 15 07/13/2022 1030   AST 21 05/18/2022 0910   ALT 9 07/13/2022 1030   ALT 16 05/18/2022 0910   ALKPHOS 55 07/13/2022 1030   BILITOT 0.5 07/13/2022 1030   BILITOT 0.3 05/18/2022 0910   GFRNONAA 35 (L) 07/13/2022 1030   GFRNONAA 38 (L) 05/18/2022 0910   GFRAA >60 05/16/2019 0335    No results found for: "SPEP", "UPEP"  Lab Results  Component Value Date   WBC 6.1 07/13/2022   NEUTROABS 3.2 07/13/2022   HGB 10.9 (L) 07/13/2022   HCT 33.4 (L) 07/13/2022   MCV 87.2 07/13/2022   PLT 240 07/13/2022      Chemistry      Component Value Date/Time   NA 139 07/13/2022 1030   NA 141 02/25/2019 1222   K 3.7 07/13/2022 1030   CL 103 07/13/2022 1030   CO2 31 07/13/2022 1030   BUN 27 (H) 07/13/2022 1030   BUN 17 02/25/2019 1222   CREATININE 1.69 (H) 07/13/2022 1030   CREATININE 1.56 (H) 05/18/2022 0910      Component Value Date/Time   CALCIUM 9.4 07/13/2022 1030   ALKPHOS 55 07/13/2022 1030   AST 15 07/13/2022 1030   AST 21 05/18/2022 0910   ALT 9 07/13/2022 1030   ALT 16 05/18/2022 0910   BILITOT 0.5 07/13/2022 1030   BILITOT 0.3 05/18/2022 0910       RADIOGRAPHIC  STUDIES: I have personally reviewed the radiological images as listed and agreed with the findings in the report. CT CHEST ABDOMEN PELVIS W CONTRAST  Result Date: 07/23/2022 CLINICAL DATA:  61 year old female presents for evaluation of gynecologic neoplasm, history of endometrial cancer, assess treatment response. * Tracking Code: BO * EXAM: CT CHEST, ABDOMEN, AND PELVIS WITH CONTRAST TECHNIQUE: Multidetector CT imaging of the chest, abdomen and pelvis was performed following the standard protocol during bolus administration of intravenous contrast. RADIATION DOSE REDUCTION: This exam was performed according to the departmental dose-optimization program which includes automated exposure control, adjustment of the mA and/or kV according to patient size and/or use of iterative reconstruction technique. CONTRAST:  13m OMNIPAQUE IOHEXOL 300 MG/ML  SOLN COMPARISON:  April 03, 2022. FINDINGS: CT CHEST FINDINGS Cardiovascular: Calcified and noncalcified aortic atherosclerotic plaque. Three-vessel coronary artery disease. No aneurysmal dilation of the thoracic aorta. RIGHT IJ Port-A-Cath terminates in the upper RIGHT atrium. Heart size normal without pericardial effusion. Central pulmonary vasculature is unremarkable on venous phase. Mediastinum/Nodes: No thoracic inlet, axillary, mediastinal or hilar adenopathy. Esophagus grossly normal. Lungs/Pleura: No suspicious pulmonary nodules. Signs of pulmonary metastatic disease seen on previous imaging are no longer evident. No effusion. No consolidative changes. Airways are patent. Musculoskeletal: No  acute bone finding. No destructive bone process. No chest wall mass. See below for full musculoskeletal detail. CT ABDOMEN PELVIS FINDINGS Hepatobiliary: No focal, suspicious hepatic lesion. No pericholecystic stranding. No biliary duct dilation. Portal vein is patent. Pancreas: Normal, without mass, inflammation or ductal dilatation. Spleen: Normal. Adrenals/Urinary Tract:  Adrenal glands are normal. Mild RIGHT hydroureter without hydronephrosis. Marked RIGHT renal cortical atrophy. Ureteral dilation is diminished and hydronephrosis resolved since previous imaging. The mass along the course of the ureter is not readily apparent on current imaging. There is minimal thickening of soft tissues adjacent to bowel loops along the RIGHT pelvis (image 97/2) approximately 5 mm. Perhaps this represents the site of previous disease that was obstructing the RIGHT ureter. No perivesical stranding. No LEFT-sided hydronephrosis. Normal enhancement of the LEFT kidney. Stomach/Bowel: Stomach without signs of adjacent stranding. No sign of small bowel obstruction. No sign of small bowel inflammation. Normal appendix. Colon without signs of inflammation or obstruction. Vascular/Lymphatic: Aortic atherosclerosis. No sign of aneurysm. Smooth contour of the IVC. There is no gastrohepatic or hepatoduodenal ligament lymphadenopathy. No retroperitoneal or mesenteric lymphadenopathy. No pelvic sidewall lymphadenopathy. Reproductive: Post hysterectomy. Other: No ascites. LEFT retroperitoneal soft tissue nodule (image 76/2) 9 mm previously 20 mm. No signs of new peritoneal or retroperitoneal nodularity. Musculoskeletal: No acute bone finding. No destructive bone process. Spinal degenerative changes. IMPRESSION: 1. Improving appearance of sequela of metastatic disease. RIGHT pelvic sidewall mass obstructing the RIGHT ureter is no longer clearly seen. LEFT retroperitoneal soft tissue has diminished. 2. No signs of pulmonary metastatic disease or metastatic disease to the chest. 3. Mild RIGHT hydroureter without hydronephrosis. Resolution of hydronephrosis but with further atrophy of the RIGHT kidney. 4. Aortic atherosclerosis and 3 vessel coronary artery disease. Aortic Atherosclerosis (ICD10-I70.0). Electronically Signed   By: Zetta Bills M.D.   On: 07/23/2022 12:33   ECHOCARDIOGRAM COMPLETE  Result Date:  07/18/2022    ECHOCARDIOGRAM REPORT   Patient Name:   Krista [name] [facility] Date of Exam: 07/18/2022 Medical Rec #:  [id number]        Height:       63.0 in Accession #:    [id number]       Weight:       145.2 lb Date of Birth:  July 04, 1961       BSA:          1.688 m Patient Age:    61 years         BP:           181/98 mmHg Patient Gender: F                HR:           58 bpm. Exam Location:  Outpatient Procedure: 2D Echo, 3D Echo, Cardiac Doppler, Color Doppler and Strain Analysis Indications:    Z51.11 Encounter for antineoplastic chemotheraphy  History:        Patient has prior history of Echocardiogram examinations, most                 recent 05/08/2022. Cardiomyopathy, Previous Myocardial                 Infarction, Signs/Symptoms:Syncope and Dyspnea; Risk                 Factors:Hypertension, Diabetes and Current Smoker. Cancer.  Sonographer:    Roseanna Rainbow RDCS Referring Phys: [id number] ADITYA SABHARWAL IMPRESSIONS  1. Left ventricular ejection fraction, by estimation, is 45 to 50%. Left ventricular  ejection fraction by 3D volume is 50 %. The left ventricle has mildly decreased function. The left ventricle demonstrates global hypokinesis. There is severe concentric  left ventricular hypertrophy. Left ventricular diastolic parameters are indeterminate.  2. Right ventricular systolic function is normal. The right ventricular size is normal. There is normal pulmonary artery systolic pressure.  3. There is no evidence of cardiac tamponade.  4. The mitral valve is grossly normal. No evidence of mitral valve regurgitation. No evidence of mitral stenosis.  5. The aortic valve is tricuspid. There is mild thickening of the aortic valve. Aortic valve regurgitation is mild to moderate.  6. The inferior vena cava is normal in size with greater than 50% respiratory variability, suggesting right atrial pressure of 3 mmHg. Comparison(s): No prior Echocardiogram. Conclusion(s)/Recommendation(s): In PSAX view, LV thickness approaches  18 mm. Consider CMR if clinically indicated. FINDINGS  Left Ventricle: Left ventricular ejection fraction, by estimation, is 45 to 50%. Left ventricular ejection fraction by 3D volume is 50 %. The left ventricle has mildly decreased function. The left ventricle demonstrates global hypokinesis. Global longitudinal strain performed but not reported based on interpreter judgement due to suboptimal tracking. The left ventricular internal cavity size was normal in size. There is severe concentric left ventricular hypertrophy. Left ventricular diastolic parameters are indeterminate. Right Ventricle: The right ventricular size is normal. No increase in right ventricular wall thickness. Right ventricular systolic function is normal. There is normal pulmonary artery systolic pressure. The tricuspid regurgitant velocity is 1.52 m/s, and  with an assumed right atrial pressure of 3 mmHg, the estimated right ventricular systolic pressure is 67.1 mmHg. Left Atrium: Left atrial size was normal in size. Right Atrium: Right atrial size was normal in size. Pericardium: Trivial pericardial effusion is present. The pericardial effusion is posterior to the left ventricle. There is no evidence of cardiac tamponade. Mitral Valve: The mitral valve is grossly normal. No evidence of mitral valve regurgitation. No evidence of mitral valve stenosis. Tricuspid Valve: The tricuspid valve is normal in structure. Tricuspid valve regurgitation is mild . No evidence of tricuspid stenosis. Aortic Valve: The aortic valve is tricuspid. There is mild thickening of the aortic valve. Aortic valve regurgitation is mild to moderate. Aortic regurgitation PHT measures 530 msec. Pulmonic Valve: The pulmonic valve was normal in structure. Pulmonic valve regurgitation is trivial. No evidence of pulmonic stenosis. Aorta: The aortic root and ascending aorta are structurally normal, with no evidence of dilitation. Venous: The inferior vena cava is normal in size  with greater than 50% respiratory variability, suggesting right atrial pressure of 3 mmHg. IAS/Shunts: The interatrial septum appears to be lipomatous. No atrial level shunt detected by color flow Doppler.  LEFT VENTRICLE PLAX 2D LVIDd:         3.70 cm         Diastology LVIDs:         2.80 cm         LV e' medial:    4.57 cm/s LV PW:         1.60 cm         LV E/e' medial:  11.9 LV IVS:        1.50 cm         LV e' lateral:   6.42 cm/s LVOT diam:     2.30 cm         LV E/e' lateral: 8.5 LV SV:         76 LV SV Index:   45  LVOT Area:     4.15 cm        3D Volume EF                                LV 3D EF:    Left                                             ventricul LV Volumes (MOD)                            ar LV vol d, MOD    80.6 ml                    ejection A2C:                                        fraction LV vol d, MOD    98.3 ml                    by 3D A4C:                                        volume is LV vol s, MOD    40.5 ml                    50 %. A2C: LV vol s, MOD    48.4 ml A4C:                           3D Volume EF: LV SV MOD A2C:   40.1 ml       3D EF:        50 % LV SV MOD A4C:   98.3 ml       LV EDV:       158 ml LV SV MOD BP:    45.6 ml       LV ESV:       79 ml                                LV SV:        79 ml RIGHT VENTRICLE            IVC RV S prime:     8.62 cm/s  IVC diam: 1.20 cm TAPSE (M-mode): 2.0 cm LEFT ATRIUM           Index        RIGHT ATRIUM           Index LA diam:      4.00 cm 2.37 cm/m   RA Area:     16.70 cm LA Vol (A2C): 53.5 ml 31.70 ml/m  RA Volume:   42.90 ml  25.42 ml/m LA Vol (A4C): 30.9 ml 18.31 ml/m  AORTIC VALVE             PULMONIC VALVE LVOT Vmax:   81.60 cm/s  PR End Diast Vel: 1.48 msec  LVOT Vmean:  52.800 cm/s LVOT VTI:    0.183 m AI PHT:      530 msec  AORTA Ao Root diam: 3.30 cm Ao Asc diam:  3.85 cm MITRAL VALVE               TRICUSPID VALVE MV Area (PHT): 2.74 cm    TR Peak grad:   9.2 mmHg MV Decel Time: 277 msec    TR Vmax:        152.00 cm/s  MV E velocity: 54.50 cm/s MV A velocity: 70.77 cm/s  SHUNTS MV E/A ratio:  0.77        Systemic VTI:  0.18 m                            Systemic Diam: 2.30 cm Rudean Haskell MD Electronically signed by Rudean Haskell MD Signature Date/Time: 07/18/2022/2:10:44 PM    Final

## 2022-07-28 IMAGING — US IR IMAGING GUIDED PORT INSERTION
2 series · 4 of 4 positions shown · non-contrast
Comparison: none

CLINICAL DATA: Short malfunctioning right subclavian power port
catheter

[Series 1: ir fluoro/shunt/fist · 1 of 1 slices shown]
[im 1/1]
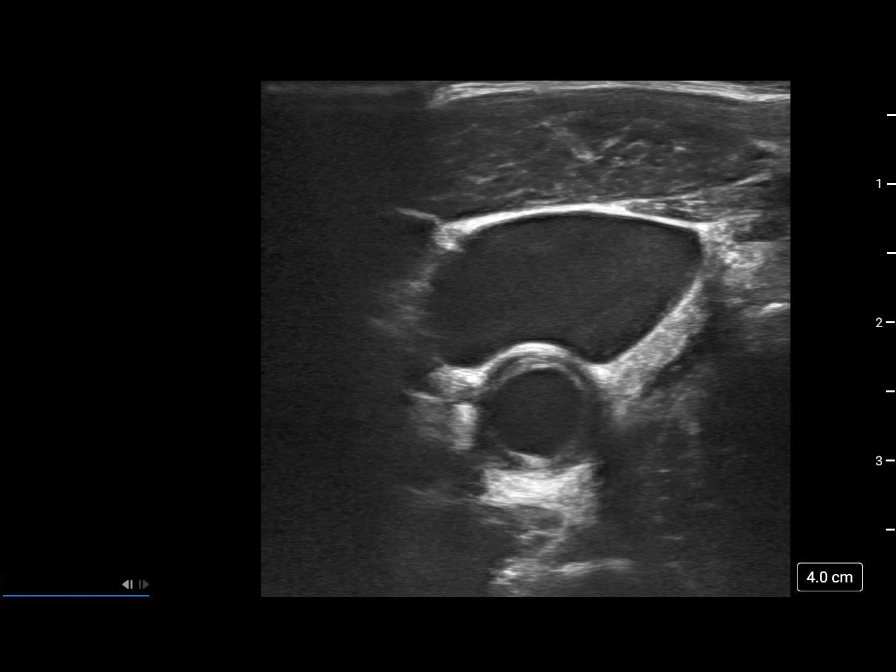

[aw electronic film · 3 of 3 slices shown]
[im 1/3]
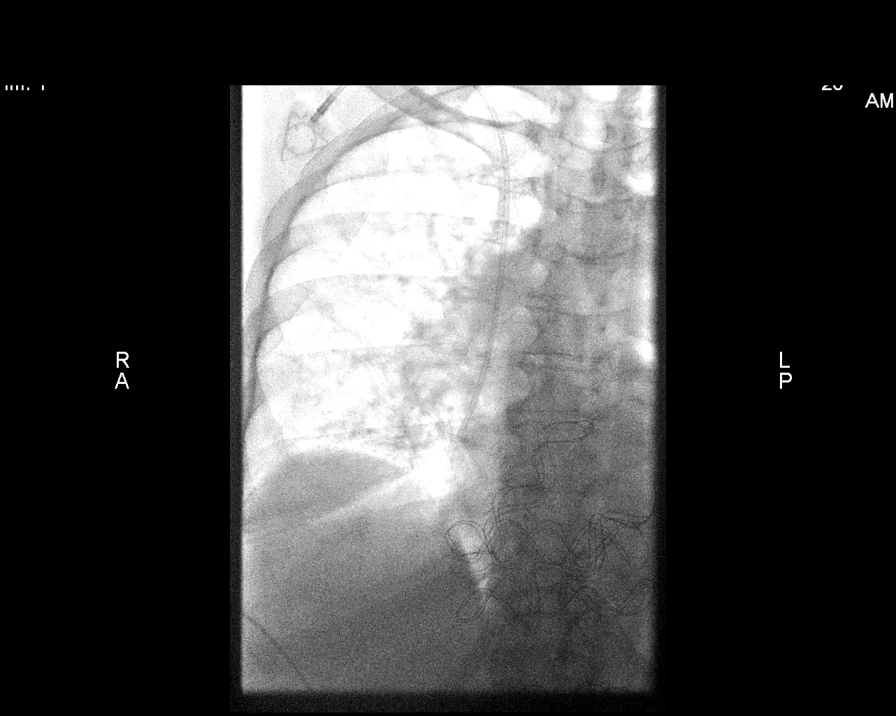
[im 2/3]
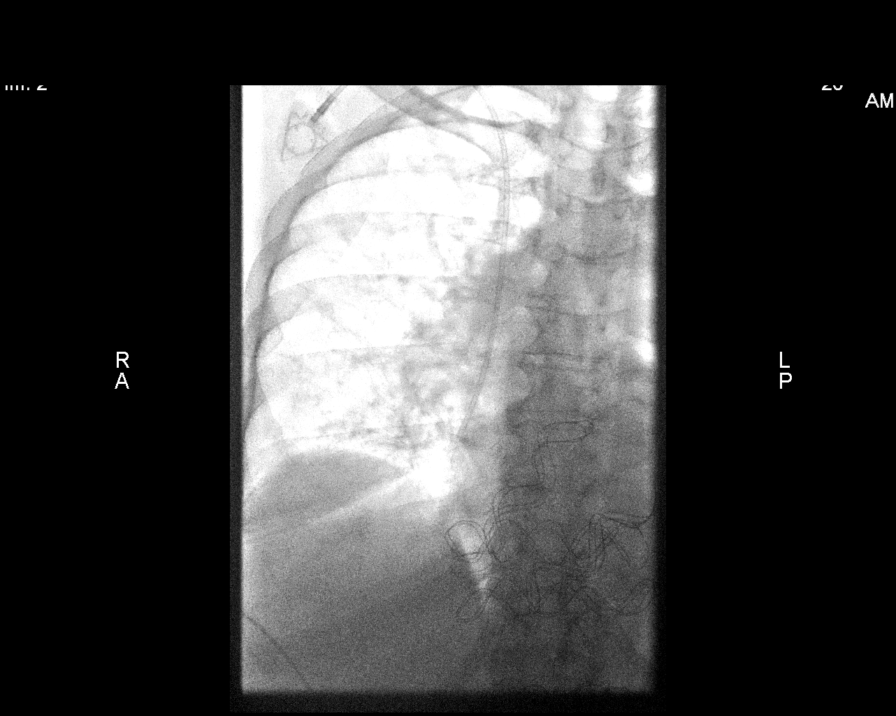
[im 3/3]
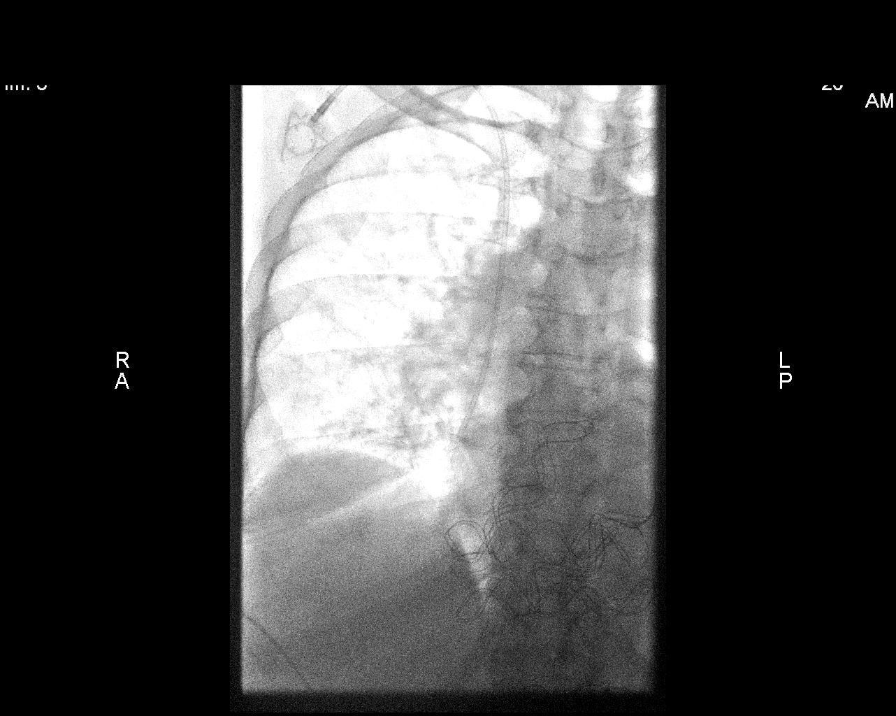

[4 of 4 positions shown; findings below may reference images not displayed]

EXAM:
ULTRASOUND GUIDANCE FOR VASCULAR ACCESS

RIGHT IJ POWER PORT CATHETER INSERTION (UTILIZING THE EXISTING RIGHT
CHEST PORT POCKET)

Radiologist:  Lorenzo, Alexei

Guidance:  ULTRASOUND AND FLUOROSCOPIC

MEDICATIONS:
1% lidocaine local with epinephrine.

ANESTHESIA/SEDATION:
Versed 3.0 mg IV; Fentanyl 125 mcg IV;

Moderate Sedation Time:  42 minute

The patient was continuously monitored during the procedure by the
interventional radiology nurse under my direct supervision.

FLUOROSCOPY:
Fluoroscopy Time:(2.0 mGy).

CONTRAST:  None.

COMPLICATIONS:
None immediate.

PROCEDURE:
Informed consent was obtained from the patient following explanation
of the procedure, risks, benefits and alternatives. The patient
understands, agrees and consents for the procedure. All questions
were addressed. A time out was performed.

Under sterile conditions and local anesthesia, right internal
jugular micropuncture venous access was performed with ultrasound.
Images were obtained for documentation. A guide wire was inserted
followed by a 4-French dilator. A catheter and guidewire were
utilized to access the IVC.

The existing right subclavian power port catheter was removed. This
was done under sterile conditions and local anesthesia. An incision
was made along the existing port catheter scar. Port catheter was
removed with sharp and blunt dissection from the subcutaneous
pocket. Pocket was flushed with saline and suspected. No signs of
infection. Adequate hemostasis. Subcutaneous pocket can be utilized
for revision.

Measurements were obtained from the SVC/RA junction back to the
venotomy site. The port catheter was assembled and checked for
leakage. The port catheter was secured in the existing pocket with
two retention Ethilon sutures. The tubing was tunneled
subcutaneously to the new right IJ venotomy site and inserted into
the SVC/RA junction through a valved peel-away sheath. Position was
confirmed with fluoroscopy. No immediate complications. The patient
tolerated the procedure well.

Incisions were closed with Vicryl suture and Dermabond. The port
catheter was accessed, blood was aspirated followed by saline and
heparin flushes. Needle was removed. A sterile dressing was applied.
IMPRESSION: Successful ultrasound and fluoroscopic right IJ power port catheter
insertion utilizing the existing right subclavian port catheter
pocket.

Malpositioned right subclavian port catheter was removed.

## 2022-09-08 ENCOUNTER — Telehealth (HOSPITAL_COMMUNITY): Payer: Self-pay | Admitting: Cardiology

## 2022-09-12 ENCOUNTER — Inpatient Hospital Stay: Payer: Medicare Other | Attending: Hematology and Oncology

## 2022-09-12 ENCOUNTER — Other Ambulatory Visit: Payer: Self-pay

## 2022-09-12 DIAGNOSIS — C541 Malignant neoplasm of endometrium: Secondary | ICD-10-CM | POA: Diagnosis present

## 2022-09-12 DIAGNOSIS — Z452 Encounter for adjustment and management of vascular access device: Secondary | ICD-10-CM | POA: Insufficient documentation

## 2022-09-12 MED ORDER — HEPARIN SOD (PORK) LOCK FLUSH 100 UNIT/ML IV SOLN
500.0000 [IU] | Freq: Once | INTRAVENOUS | Status: AC
  Administered 2022-09-12: 500 [IU]

## 2022-09-12 MED ORDER — SODIUM CHLORIDE 0.9% FLUSH
10.0000 mL | Freq: Once | INTRAVENOUS | Status: AC
  Administered 2022-09-12: 10 mL

## 2022-09-22 ENCOUNTER — Ambulatory Visit: Payer: Medicare Other | Attending: Cardiovascular Disease | Admitting: Cardiovascular Disease

## 2022-09-22 ENCOUNTER — Encounter: Payer: Self-pay | Admitting: Cardiovascular Disease

## 2022-09-22 VITALS — BP 138/96 | HR 86 | Ht 63.0 in | Wt 148.4 lb

## 2022-09-22 DIAGNOSIS — I2510 Atherosclerotic heart disease of native coronary artery without angina pectoris: Secondary | ICD-10-CM | POA: Diagnosis not present

## 2022-09-22 DIAGNOSIS — I10 Essential (primary) hypertension: Secondary | ICD-10-CM

## 2022-09-22 DIAGNOSIS — Z720 Tobacco use: Secondary | ICD-10-CM | POA: Diagnosis present

## 2022-09-22 DIAGNOSIS — E782 Mixed hyperlipidemia: Secondary | ICD-10-CM | POA: Insufficient documentation

## 2022-09-22 DIAGNOSIS — I427 Cardiomyopathy due to drug and external agent: Secondary | ICD-10-CM | POA: Diagnosis present

## 2022-09-22 MED ORDER — ASPIRIN 81 MG PO TBEC: 81.0000 mg | DELAYED_RELEASE_TABLET | Freq: Every day | ORAL | Status: AC

## 2022-09-22 NOTE — Progress Notes (Signed)
09/22/2022 Krista [name]   [birth year]  [id number]  Primary Physician Drue Flirt, MD Primary Cardiologist: Lorretta Harp MD FACP, [place], [place], [place]  HPI:  Krista [name] is a 62 y.o.   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    I last saw her in the office 10/12/2020.[name]  She is accompanied by her mother [name] today.  Her risk factors include tobacco abuse, treated hypertension, diabetes and hyperlipidemia.  She presented on 12/13/2018 with a non-STEMI and underwent cardiac catheterization 3 days later by Dr. Ellyn Hack revealing high-grade proximal LAD disease which was stented with a synergy drug-eluting stent.  She did have high-grade diffuse diagonal branch disease, ramus branch and RCA disease all of which were treated medically.  She was discharged home the following day on dual antiplatelet therapy including aspirin and Brilinta and has remained pain-free since that time although she does continue to smoke 1 pack/week.   She was admitted in September with sepsis and had rhabdomyolysis.  Enzymes were mildly elevated as well and she was seen by Dr. Burt Knack who thought this was related to "demand ischemia".  In ischemic evaluation was not pursued.  She is on Plavix and aspirin currently.  She denies chest pain or shortness of breath.  Because of rhabdomyolysis it was recommended that she switch from statin therapy which she was on (pravastatin) to a PCSK9.   Since I saw her 2 years ago she continues to do well.  She is completely asymptomatic.  She does however continue to smoke only 1 cigarette a day.  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  Her EF is apparently decreased from normal down to 40 to 45% and has been followed in our cardio oncology clinic.  Current Meds  Medication Sig   acetaminophen (TYLENOL) 500 MG tablet Take 1,000 mg by mouth every 6  (six) hours as needed for mild pain or headache.   Evolocumab (REPATHA SURECLICK) [id number] MG/ML SOAJ Inject 140 mg into the skin every 14 (fourteen) days.   lidocaine-prilocaine (EMLA) cream Apply 1 application topically as needed.   loratadine (CLARITIN) 10 MG tablet Take 1 tablet (10 mg total) by mouth daily as needed for allergies.   losartan (COZAAR) 25 MG tablet Take 0.5 tablets (12.5 mg total) by mouth daily.   metFORMIN (GLUCOPHAGE) 1000 MG tablet Take 1,000 mg by mouth 2 (two) times daily with a meal.   metoprolol succinate (TOPROL XL) 25 MG 24 hr tablet Take 1 tablet (25 mg total) by mouth daily.   nitroGLYCERIN (NITROSTAT) 0.4 MG SL tablet Place 1 tablet (0.4 mg total) under the tongue every 5 (five) minutes as needed for chest pain.   ondansetron (ZOFRAN) 8 MG tablet Take 1 tablet (8 mg total) by mouth every 8 (eight) hours as needed for nausea.   polyethylene glycol (MIRALAX) 17 g packet Take 17 g by mouth daily.   prochlorperazine (COMPAZINE) 10 MG tablet Take 1 tablet (10 mg total) by mouth every 6 (six) hours as needed for nausea or vomiting.   senna-docusate (SENOKOT-S) 8.6-50 MG tablet Take 2 tablets by mouth 2 (two) times daily.   spironolactone (ALDACTONE) 25 MG tablet Take 0.5 tablets (12.5 mg total) by mouth daily.     Allergies  Allergen Reactions   Statins Itching    itching itching   Agrostis Alba Pollen Extract [Gramineae Pollens]  Zetia [Ezetimibe] Itching    Social History   Socioeconomic History   Marital status: Single    Spouse name: Not on file   Number of children: Not on file   Years of education: Not on file   Highest education level: Not on file  Occupational History   Not on file  Tobacco Use   Smoking status: Former    Packs/day: 0.50    Years: 33.00    Total pack years: 16.50    Types: Cigarettes    Quit date: 01/08/2022    Years since quitting: 0.7   Smokeless tobacco: Never  Vaping Use   Vaping Use: Never used  Substance and Sexual  Activity   Alcohol use: No   Drug use: No   Sexual activity: Yes    Birth control/protection: Post-menopausal  Other Topics Concern   Not on file  Social History Narrative   Not on file   Social Determinants of Health   Financial Resource Strain: Not on file  Food Insecurity: Not on file  Transportation Needs: No Transportation Needs (12/13/2020)   PRAPARE - Hydrologist (Medical): No    Lack of Transportation (Non-Medical): No  Physical Activity: Not on file  Stress: Not on file  Social Connections: Not on file  Intimate Partner Violence: Not on file     Review of Systems: General: negative for chills, fever, night sweats or weight changes.  Cardiovascular: negative for chest pain, dyspnea on exertion, edema, orthopnea, palpitations, paroxysmal nocturnal dyspnea or shortness of breath Dermatological: negative for rash Respiratory: negative for cough or wheezing Urologic: negative for hematuria Abdominal: negative for nausea, vomiting, diarrhea, bright red blood per rectum, melena, or hematemesis Neurologic: negative for visual changes, syncope, or dizziness All other systems reviewed and are otherwise negative except as noted above.    Blood pressure (!) 138/96, pulse 86, height 5' 3"$  (1.6 m), weight 148 lb 6.4 oz (67.3 kg), SpO2 94 %.  General appearance: alert and no distress Neck: no adenopathy, no carotid bruit, no JVD, supple, symmetrical, trachea midline, and thyroid not enlarged, symmetric, no tenderness/mass/nodules Lungs: clear to auscultation bilaterally Heart: regular rate and rhythm, S1, S2 normal, no murmur, click, rub or gallop Extremities: extremities normal, atraumatic, no cyanosis or edema Pulses: 2+ and symmetric Skin: Skin color, texture, turgor normal. No rashes or lesions Neurologic: Grossly normal  EKG not performed today  ASSESSMENT AND PLAN:   HTN (hypertension) History of essential hypertension blood pressure  measured today at 138/96.  She is on metoprolol and losartan as well as spironolactone.  She takes her blood pressure at home and it usually runs in the 125/80 range.  She apparently took her medications late today.  Tobacco abuse Long history of tobacco use now only smoking 1 cigarette a day.  Coronary artery disease History of CAD status post non-STEMI 12/13/2018.  She had cardiac catheterization 3 days later by Dr. Ellyn Hack revealing a high-grade proximal LAD which was stented with a Synergy drug-eluting stent.  She did have high-grade diagonal branch disease as well as ramus and RCA disease all of which were treated medically.  For some reason, she is not on any antiplatelet agents.  Starting her back on a baby aspirin every day.  Hyperlipidemia History anemia on Repatha with lipid profile performed 01/30/2022 revealing total cholesterol of 134, LDL of 82 and HDL 37.  Cardiomyopathy ([place]) History of nonischemic cardiomyopathy with recent echo that showed a reduction in EF from normal  down to 40 to 45% most recently 07/18/2022.  She has had ovarian, endometrial and lung cancer despite chemotherapy.  She has been evaluated by Dr.Sabharwal, heart failure specialist, for cardio oncology.  She is on appropriate guideline directed optimal medical therapy and is asymptomatic.     Lorretta Harp MD FACP,FACC,FAHA, [facility] 09/22/2022 11:46 AM

## 2022-09-22 NOTE — Patient Instructions (Signed)
Medication Instructions:  Your physician recommends that you continue on your current medications as directed. Please refer to the Current Medication list given to you today.  *If you need a refill on your cardiac medications before your next appointment, please call your pharmacy*   Follow-Up: At [facility], you and your health needs are our priority.  As part of our continuing mission to provide you with exceptional heart care, we have created designated Provider Care Teams.  These Care Teams include your primary Cardiologist (physician) and Advanced Practice Providers (APPs -  Physician Assistants and Nurse Practitioners) who all work together to provide you with the care you need, when you need it.  We recommend signing up for the patient portal called "MyChart".  Sign up information is provided on this After Visit Summary.  MyChart is used to connect with patients for Virtual Visits (Telemedicine).  Patients are able to view lab/test results, encounter notes, upcoming appointments, etc.  Non-urgent messages can be sent to your provider as well.   To learn more about what you can do with MyChart, go to [email or web address].    Your next appointment:   6 month(s)  Provider:   Diona Browner, NP      Then, Quay Burow, MD will plan to see you again in 12 month(s).

## 2022-09-22 NOTE — Assessment & Plan Note (Signed)
History anemia on Repatha with lipid profile performed 01/30/2022 revealing total cholesterol of 134, LDL of 82 and HDL 37.

## 2022-09-22 NOTE — Assessment & Plan Note (Signed)
History of CAD status post non-STEMI 12/13/2018.  She had cardiac catheterization 3 days later by Dr. Ellyn Hack revealing a high-grade proximal LAD which was stented with a Synergy drug-eluting stent.  She did have high-grade diagonal branch disease as well as ramus and RCA disease all of which were treated medically.  For some reason, she is not on any antiplatelet agents.  Starting her back on a baby aspirin every day.

## 2022-09-22 NOTE — Assessment & Plan Note (Signed)
History of essential hypertension blood pressure measured today at 138/96.  She is on metoprolol and losartan as well as spironolactone.  She takes her blood pressure at home and it usually runs in the 125/80 range.  She apparently took her medications late today.

## 2022-09-22 NOTE — Addendum Note (Signed)
Addended by: Beatrix Fetters on: 09/22/2022 12:12 PM   Modules accepted: Orders

## 2022-09-22 NOTE — Assessment & Plan Note (Signed)
History of nonischemic cardiomyopathy with recent echo that showed a reduction in EF from normal down to 40 to 45% most recently 07/18/2022.  She has had ovarian, endometrial and lung cancer despite chemotherapy.  She has been evaluated by Dr.Sabharwal, heart failure specialist, for cardio oncology.  She is on appropriate guideline directed optimal medical therapy and is asymptomatic.

## 2022-09-22 NOTE — Assessment & Plan Note (Signed)
Long history of tobacco use now only smoking 1 cigarette a day.

## 2022-10-24 ENCOUNTER — Other Ambulatory Visit: Payer: Self-pay

## 2022-10-24 ENCOUNTER — Ambulatory Visit (HOSPITAL_COMMUNITY)
Admission: RE | Admit: 2022-10-24 | Discharge: 2022-10-24 | Disposition: A | Discharge status: Home / Self Care | Payer: Medicare Other | Source: Ambulatory Visit | Attending: Hematology and Oncology | Admitting: Hematology and Oncology

## 2022-10-24 ENCOUNTER — Encounter (HOSPITAL_COMMUNITY): Payer: Medicare Other | Admitting: Cardiology

## 2022-10-24 ENCOUNTER — Inpatient Hospital Stay: Payer: Medicare Other | Attending: Hematology and Oncology

## 2022-10-24 DIAGNOSIS — F17290 Nicotine dependence, other tobacco product, uncomplicated: Secondary | ICD-10-CM | POA: Diagnosis not present

## 2022-10-24 DIAGNOSIS — Z79620 Long term (current) use of immunosuppressive biologic: Secondary | ICD-10-CM | POA: Insufficient documentation

## 2022-10-24 DIAGNOSIS — C541 Malignant neoplasm of endometrium: Secondary | ICD-10-CM

## 2022-10-24 DIAGNOSIS — Z5112 Encounter for antineoplastic immunotherapy: Secondary | ICD-10-CM | POA: Insufficient documentation

## 2022-10-24 DIAGNOSIS — Z5111 Encounter for antineoplastic chemotherapy: Secondary | ICD-10-CM | POA: Diagnosis present

## 2022-10-24 DIAGNOSIS — Z79899 Other long term (current) drug therapy: Secondary | ICD-10-CM | POA: Diagnosis not present

## 2022-10-24 DIAGNOSIS — N183 Chronic kidney disease, stage 3 unspecified: Secondary | ICD-10-CM | POA: Insufficient documentation

## 2022-10-24 LAB — CBC WITH DIFFERENTIAL/PLATELET
Abs Immature Granulocytes: 0.01 10*3/uL (ref 0.00–0.07)
Basophils Absolute: 0 10*3/uL (ref 0.0–0.1)
Basophils Relative: 0 %
Eosinophils Absolute: 0.1 10*3/uL (ref 0.0–0.5)
Eosinophils Relative: 3 %
HCT: 37.2 % (ref 36.0–46.0)
Hemoglobin: 12.4 g/dL (ref 12.0–15.0)
Immature Granulocytes: 0 %
Lymphocytes Relative: 46 %
Lymphs Abs: 2.4 10*3/uL (ref 0.7–4.0)
MCH: 28.2 pg (ref 26.0–34.0)
MCHC: 33.3 g/dL (ref 30.0–36.0)
MCV: 84.7 fL (ref 80.0–100.0)
Monocytes Absolute: 0.5 10*3/uL (ref 0.1–1.0)
Monocytes Relative: 10 %
Neutro Abs: 2.2 10*3/uL (ref 1.7–7.7)
Neutrophils Relative %: 41 %
Platelets: 180 10*3/uL (ref 150–400)
RBC: 4.39 MIL/uL (ref 3.87–5.11)
RDW: 14.6 % (ref 11.5–15.5)
WBC: 5.3 10*3/uL (ref 4.0–10.5)
nRBC: 0 % (ref 0.0–0.2)

## 2022-10-24 LAB — COMPREHENSIVE METABOLIC PANEL
ALT: 14 U/L (ref 0–44)
AST: 17 U/L (ref 15–41)
Albumin: 3.8 g/dL (ref 3.5–5.0)
Alkaline Phosphatase: 53 U/L (ref 38–126)
Anion gap: 6 (ref 5–15)
BUN: 31 mg/dL — ABNORMAL HIGH (ref 6–20)
CO2: 29 mmol/L (ref 22–32)
Calcium: 8.9 mg/dL (ref 8.9–10.3)
Chloride: 103 mmol/L (ref 98–111)
Creatinine, Ser: 1.61 mg/dL — ABNORMAL HIGH (ref 0.44–1.00)
GFR, Estimated: 37 mL/min — ABNORMAL LOW (ref >60)
Glucose, Bld: 227 mg/dL — ABNORMAL HIGH (ref 70–99)
Potassium: 3.6 mmol/L (ref 3.5–5.1)
Sodium: 138 mmol/L (ref 135–145)
Total Bilirubin: 0.3 mg/dL (ref 0.3–1.2)
Total Protein: 7.3 g/dL (ref 6.5–8.1)

## 2022-10-24 MED ORDER — HEPARIN SOD (PORK) LOCK FLUSH 100 UNIT/ML IV SOLN
500.0000 [IU] | Freq: Once | INTRAVENOUS | Status: AC
  Administered 2022-10-24: 500 [IU] via INTRAVENOUS

## 2022-10-24 MED ORDER — SODIUM CHLORIDE (PF) 0.9 % IJ SOLN
INTRAMUSCULAR | Status: AC
  Filled 2022-10-24: qty 50

## 2022-10-24 MED ORDER — HEPARIN SOD (PORK) LOCK FLUSH 100 UNIT/ML IV SOLN
INTRAVENOUS | Status: AC
  Filled 2022-10-24: qty 5

## 2022-10-24 MED ORDER — IOHEXOL 300 MG/ML  SOLN
80.0000 mL | Freq: Once | INTRAMUSCULAR | Status: AC | PRN
  Administered 2022-10-24: 80 mL via INTRAVENOUS

## 2022-10-24 MED ORDER — SODIUM CHLORIDE 0.9% FLUSH
10.0000 mL | Freq: Once | INTRAVENOUS | Status: AC
  Administered 2022-10-24: 10 mL

## 2022-10-24 MED ORDER — IOHEXOL 9 MG/ML PO SOLN
500.0000 mL | ORAL | Status: AC
  Administered 2022-10-24: 1000 mL via ORAL

## 2022-10-24 MED ORDER — IOHEXOL 9 MG/ML PO SOLN
ORAL | Status: AC
  Filled 2022-10-24: qty 1000

## 2022-10-25 ENCOUNTER — Telehealth: Payer: Self-pay

## 2022-10-25 NOTE — Telephone Encounter (Signed)
Received call report for CT CAP results. Forwarded to MD. Pt has appt with MD Alvy Bimler 10/26/22 at 1140.

## 2022-10-26 ENCOUNTER — Encounter: Payer: Self-pay | Admitting: Hematology and Oncology

## 2022-10-26 ENCOUNTER — Other Ambulatory Visit: Payer: Self-pay

## 2022-10-26 ENCOUNTER — Inpatient Hospital Stay (HOSPITAL_BASED_OUTPATIENT_CLINIC_OR_DEPARTMENT_OTHER): Payer: Medicare Other | Admitting: Hematology and Oncology

## 2022-10-26 VITALS — BP 165/105 | HR 81 | Temp 97.9°F | Resp 18 | Ht 63.0 in | Wt 146.0 lb

## 2022-10-26 DIAGNOSIS — F17200 Nicotine dependence, unspecified, uncomplicated: Secondary | ICD-10-CM | POA: Diagnosis not present

## 2022-10-26 DIAGNOSIS — C541 Malignant neoplasm of endometrium: Secondary | ICD-10-CM

## 2022-10-26 DIAGNOSIS — N183 Chronic kidney disease, stage 3 unspecified: Secondary | ICD-10-CM

## 2022-10-26 DIAGNOSIS — C7802 Secondary malignant neoplasm of left lung: Secondary | ICD-10-CM

## 2022-10-26 DIAGNOSIS — Z5112 Encounter for antineoplastic immunotherapy: Secondary | ICD-10-CM | POA: Diagnosis not present

## 2022-10-26 DIAGNOSIS — C7801 Secondary malignant neoplasm of right lung: Secondary | ICD-10-CM | POA: Diagnosis not present

## 2022-10-26 MED ORDER — DEXAMETHASONE 4 MG PO TABS: ORAL_TABLET | ORAL | 6 refills | Status: DC

## 2022-10-26 NOTE — Assessment & Plan Note (Signed)
She has intermittent acute on chronic renal failure We discussed importance of hydration and risk factor modification She can proceed as long as creatinine is less than 2

## 2022-10-26 NOTE — Assessment & Plan Note (Signed)
I reviewed CT imaging which unfortunately showed disease progression Due to history of cardiomyopathy, I do not recommend re-challenging her with trastuzumab With her stage 4 disease, I recommend combination of carboplatin, paclitaxel and Dostarlimab We reviewed the NCCN guidelines I recommend treatment based on publication as follows:  Dostarlimab for Primary Advanced or Recurrent Endometrial Cancer  Mansoor Reola Mosher, M.D., Edwinna Areola. Cheri Rous, M.D., Virl Cagey. Slomovitz, M.D., Ren Loraine Maple, Ph.D., Suzie Portela, Ph.D., Para Skeans, M.D., Moises Blood, M.D., Annye Rusk, M.D., Verneda Skill, M.D., Vilinda Blanks. Carmin Muskrat, M.D., Ph.D., Aloha Gell. Romero Belling, M.D., Evlyn Clines, M.D., et al., for the [place] [id number] DOI: 10.1056/[id number]  BACKGROUND Dostarlimab is an immune-checkpoint inhibitor that targets the programmed cell death 1 receptor. The combination of chemotherapy and immunotherapy may have synergistic effects in the treatment of endometrial cancer.  METHODS We conducted a phase 3, global, double-blind, randomized, placebo-controlled trial. Eligible patients with primary advanced stage III or IV or first recurrent endometrial cancer were randomly assigned in a 1:1 ratio to receive either dostarlimab (500 mg) or placebo, plus carboplatin (area under the concentration-time curve, 5 mg per milliliter per minute) and paclitaxel (175 mg per square meter of body-surface area), every 3 weeks (six cycles), followed by dostarlimab (1000 mg) or placebo every 6 weeks for up to 3 years. The primary end points were progression-free survival as assessed by the investigator according to Response Evaluation Criteria in Solid Tumors (RECIST), version 1.1, and overall survival. Safety was also assessed.  RESULTS Of the 494 patients who underwent randomization, 118 (23.9%) had mismatch repair-deficient (dMMR), microsatellite instability-high (MSI-H) tumors. In  the dMMR-MSI-H population, estimated progression-free survival at 24 months was 61.4% (95% confidence interval [CI], 46.3 to 73.4) in the dostarlimab group and 15.7% (95% CI, 7.2 to 27.0) in the placebo group (hazard ratio for progression or death, 0.28; 95% CI, 0.16 to 0.50; P<0.001). In the overall population, progression-free survival at 24 months was 36.1% (95% CI, 29.3 to 42.9) in the dostarlimab group and 18.1% (95% CI, 13.0 to 23.9) in the placebo group (hazard ratio, 0.64; 95% CI, 0.51 to 0.80; P<0.001). Overall survival at 24 months was 71.3% (95% CI, 64.5 to 77.1) with dostarlimab and 56.0% (95% CI, 48.9 to 62.5) with placebo (hazard ratio for death, 0.64; 95% CI, 0.46 to 0.87). The most common adverse events that occurred or worsened during treatment were nausea (53.9% of the patients in the dostarlimab group and 45.9% of those in the placebo group), alopecia (53.5% and 50.0%), and fatigue (51.9% and 54.5%). Severe and serious adverse events were more frequent in the dostarlimab group than in the placebo group.  CONCLUSIONS Dostarlimab plus carboplatin-paclitaxel significantly increased progression-free survival among patients with primary advanced or recurrent endometrial cancer, with a substantial benefit in the dMMR-MSI-H population. (Funded by [employer]; [employer].gov number, JJ:817944)  The risks, benefits, side effects of treatment is discussed with the patient and she agreed to proceed with plan of care.  I recommend mild upfront dose reduction for taxol as before

## 2022-10-26 NOTE — Progress Notes (Signed)
DISCONTINUE ON PATHWAY REGIMEN - Uterine     Cycle 1: A cycle is 21 days:     Trastuzumab-xxxx      Paclitaxel      Carboplatin    Cycles 2 through 6: A cycle is every 21 days:     Trastuzumab-xxxx      Paclitaxel      Carboplatin    Cycles 7 and beyond: A cycle is every 21 days:     Trastuzumab-xxxx   **Always confirm dose/schedule in your pharmacy ordering system**  REASON: Toxicities / Adverse Event PRIOR TREATMENT: [id number]: Carboplatin AUC=5 + Paclitaxel 175 mg/m2 + Trastuzumab 8/6 mg/kg IV q21 Days x 6 Cycles, Followed By Trastuzumab Maintenance (6 mg/kg IV q21 Days) Until Progression or Unacceptable Toxicity TREATMENT RESPONSE: Partial Response (PR)  START OFF PATHWAY REGIMEN - Uterine   [id number]:Carboplatin AUC=5 IV D1 + Dostarlimab 500 mg IV D1 + Paclitaxel 175 mg/m2 IV D1 x 6 Cycles Followed by Dostarlimab 1,000 mg IV D1 q42 Days:   Cycles 1 through 6: A cycle is every 21 days:     Dostarlimab-gxly      Paclitaxel      Carboplatin    Cycles 7 and beyond: A cycle is every 42 days:     Dostarlimab-gxly   **Always confirm dose/schedule in your pharmacy ordering system**  Patient Characteristics: Serous Carcinoma, Recurrent/Progressive Disease, Second Line, Relapse < 12 Months From Prior Therapy Histology: Serous Carcinoma Therapeutic Status: Recurrent or Progressive Disease Line of Therapy: Second Line Time to Recurrence: Relapse < 12 Months From Prior Therapy Intent of Therapy: Non-Curative / Palliative Intent, Discussed with Patient

## 2022-10-26 NOTE — Progress Notes (Signed)
[place] OFFICE PROGRESS NOTE  Patient Care Team: Drue Flirt, MD as PCP - General (Family Medicine) Lorretta Harp, MD as PCP - Cardiology (Cardiology) Heath Lark, MD as Consulting Physician (Hematology and Oncology)  ASSESSMENT & PLAN:  Endometrial cancer [facility]) I reviewed CT imaging which unfortunately showed disease progression Due to history of cardiomyopathy, I do not recommend re-challenging her with trastuzumab With her stage 4 disease, I recommend combination of carboplatin, paclitaxel and Dostarlimab We reviewed the NCCN guidelines I recommend treatment based on publication as follows:  Dostarlimab for Primary Advanced or Recurrent Endometrial Cancer  [place] Reola Mosher, M.D., Edwinna Areola. Cheri Rous, M.D., Virl Cagey. Slomovitz, M.D., Ren Loraine Maple, Ph.D., Suzie Portela, Ph.D., Para Skeans, M.D., Moises Blood, M.D., Annye Rusk, M.D., Verneda Skill, M.D., Vilinda Blanks. Carmin Muskrat, M.D., Ph.D., Aloha Gell. Romero Belling, M.D., Evlyn Clines, M.D., et al., for the [place] [phone] DOI: [id number]  BACKGROUND Dostarlimab is an immune-checkpoint inhibitor that targets the programmed cell death 1 receptor. The combination of chemotherapy and immunotherapy may have synergistic effects in the treatment of endometrial cancer.  METHODS We conducted a phase 3, global, double-blind, randomized, placebo-controlled trial. Eligible patients with primary advanced stage III or IV or first recurrent endometrial cancer were randomly assigned in a 1:1 ratio to receive either dostarlimab (500 mg) or placebo, plus carboplatin (area under the concentration-time curve, 5 mg per milliliter per minute) and paclitaxel (175 mg per square meter of body-surface area), every 3 weeks (six cycles), followed by dostarlimab (1000 mg) or placebo every 6 weeks for up to 3 years. The primary end points were progression-free survival as assessed by the  investigator according to Response Evaluation Criteria in Solid Tumors (RECIST), version 1.1, and overall survival. Safety was also assessed.  RESULTS Of the 494 patients who underwent randomization, 118 (23.9%) had mismatch repair-deficient (dMMR), microsatellite instability-high (MSI-H) tumors. In the dMMR-MSI-H population, estimated progression-free survival at 24 months was 61.4% (95% confidence interval [CI], 46.3 to 73.4) in the dostarlimab group and 15.7% (95% CI, 7.2 to 27.0) in the placebo group (hazard ratio for progression or death, 0.28; 95% CI, 0.16 to 0.50; P<0.001). In the overall population, progression-free survival at 24 months was 36.1% (95% CI, 29.3 to 42.9) in the dostarlimab group and 18.1% (95% CI, 13.0 to 23.9) in the placebo group (hazard ratio, 0.64; 95% CI, 0.51 to 0.80; P<0.001). Overall survival at 24 months was 71.3% (95% CI, 64.5 to 77.1) with dostarlimab and 56.0% (95% CI, 48.9 to 62.5) with placebo (hazard ratio for death, 0.64; 95% CI, 0.46 to 0.87). The most common adverse events that occurred or worsened during treatment were nausea (53.9% of the patients in the dostarlimab group and 45.9% of those in the placebo group), alopecia (53.5% and 50.0%), and fatigue (51.9% and 54.5%). Severe and serious adverse events were more frequent in the dostarlimab group than in the placebo group.  CONCLUSIONS Dostarlimab plus carboplatin-paclitaxel significantly increased progression-free survival among patients with primary advanced or recurrent endometrial cancer, with a substantial benefit in the dMMR-MSI-H population. (Funded by [employer]; [employer]gov number, BE:8256413)  The risks, benefits, side effects of treatment is discussed with the patient and she agreed to proceed with plan of care.  I recommend mild upfront dose reduction for taxol as before  CKD (chronic kidney disease), stage III ([place]) She has intermittent acute on chronic renal failure We discussed importance  of hydration and risk factor modification She can proceed as long  as creatinine is less than 2  TOBACCO ABUSE We discussed importance of smoking cessation  Orders Placed This Encounter  Procedures   CBC with Differential ([place] Only)    Standing Status:   Future    Standing Expiration Date:   11/06/2023   CMP ([place] only)    Standing Status:   Future    Standing Expiration Date:   11/06/2023   T4    Standing Status:   Future    Standing Expiration Date:   11/06/2023   TSH    Standing Status:   Future    Standing Expiration Date:   11/06/2023   CBC with Differential ([facility] Only)    Standing Status:   Future    Standing Expiration Date:   11/27/2023   CMP ([place] only)    Standing Status:   Future    Standing Expiration Date:   11/27/2023   CBC with Differential ([facility] Only)    Standing Status:   Future    Standing Expiration Date:   12/18/2023   CMP ([place] only)    Standing Status:   Future    Standing Expiration Date:   12/18/2023   T4    Standing Status:   Future    Standing Expiration Date:   12/18/2023   TSH    Standing Status:   Future    Standing Expiration Date:   12/18/2023   CBC with Differential ([facility] Only)    Standing Status:   Future    Standing Expiration Date:   01/08/2024   CMP ([place] only)    Standing Status:   Future    Standing Expiration Date:   01/08/2024   CBC with Differential ([place] Only)    Standing Status:   Future    Standing Expiration Date:   01/29/2024   CMP ([place] only)    Standing Status:   Future    Standing Expiration Date:   01/29/2024   CBC with Differential ([facility] Only)    Standing Status:   Future    Standing Expiration Date:   02/19/2024   CMP ([place] only)    Standing Status:   Future    Standing Expiration Date:   02/19/2024   T4    Standing Status:   Future    Standing Expiration Date:   02/19/2024   TSH    Standing Status:   Future    Standing  Expiration Date:   02/19/2024   CBC with Differential ([facility] Only)    Standing Status:   Future    Standing Expiration Date:   03/11/2024   CMP ([place] only)    Standing Status:   Future    Standing Expiration Date:   03/11/2024   T4    Standing Status:   Future    Standing Expiration Date:   03/11/2024   TSH    Standing Status:   Future    Standing Expiration Date:   03/11/2024   CBC with Differential ([facility] Only)    Standing Status:   Future    Standing Expiration Date:   04/22/2024   CMP ([place] only)    Standing Status:   Future    Standing Expiration Date:   04/22/2024   T4    Standing Status:   Future    Standing Expiration Date:   04/22/2024   TSH    Standing Status:   Future  Standing Expiration Date:   04/22/2024   CBC with Differential ([facility] Only)    Standing Status:   Future    Standing Expiration Date:   06/03/2024   CMP ([place] only)    Standing Status:   Future    Standing Expiration Date:   06/03/2024   T4    Standing Status:   Future    Standing Expiration Date:   06/03/2024   TSH    Standing Status:   Future    Standing Expiration Date:   06/03/2024    All questions were answered. The patient knows to call the clinic with any problems, questions or concerns. The total time spent in the appointment was 40 minutes encounter with patients including review of chart and various tests results, discussions about plan of care and coordination of care plan   Heath Lark, MD 10/26/2022 4:37 PM  INTERVAL HISTORY: Please see below for problem oriented charting. she returns for review of results Unfortunately she is still smoking  REVIEW OF SYSTEMS:   Constitutional: Denies fevers, chills or abnormal weight loss Eyes: Denies blurriness of vision Ears, nose, mouth, throat, and face: Denies mucositis or sore throat Respiratory: Denies cough, dyspnea or wheezes Cardiovascular: Denies palpitation, chest discomfort or lower  extremity swelling Gastrointestinal:  Denies nausea, heartburn or change in bowel habits Skin: Denies abnormal skin rashes Lymphatics: Denies new lymphadenopathy or easy bruising Neurological:Denies numbness, tingling or new weaknesses Behavioral/Psych: Mood is stable, no new changes  All other systems were reviewed with the patient and are negative.  I have reviewed the past medical history, past surgical history, social history and family history with the patient and they are unchanged from previous note.  ALLERGIES:  is allergic to statins, agrostis alba pollen extract [gramineae pollens], and zetia [ezetimibe].  MEDICATIONS:  Current Outpatient Medications  Medication Sig Dispense Refill   dexamethasone (DECADRON) 4 MG tablet Take 2 tabs at the night before chemotherapy, every 3 weeks, by mouth x 6 cycles 12 tablet 6   acetaminophen (TYLENOL) 500 MG tablet Take 1,000 mg by mouth every 6 (six) hours as needed for mild pain or headache.     aspirin EC 81 MG tablet Take 1 tablet (81 mg total) by mouth daily. Swallow whole.     Evolocumab (REPATHA SURECLICK) [id number] MG/ML SOAJ Inject 140 mg into the skin every 14 (fourteen) days. 2 pen 11   lidocaine-prilocaine (EMLA) cream Apply 1 application topically as needed. 30 g 0   loratadine (CLARITIN) 10 MG tablet Take 1 tablet (10 mg total) by mouth daily as needed for allergies. 30 tablet 11   losartan (COZAAR) 25 MG tablet Take 0.5 tablets (12.5 mg total) by mouth daily. 45 tablet 3   metFORMIN (GLUCOPHAGE) 1000 MG tablet Take 1,000 mg by mouth 2 (two) times daily with a meal.     metoprolol succinate (TOPROL XL) 25 MG 24 hr tablet Take 1 tablet (25 mg total) by mouth daily. 90 tablet 3   nitroGLYCERIN (NITROSTAT) 0.4 MG SL tablet Place 1 tablet (0.4 mg total) under the tongue every 5 (five) minutes as needed for chest pain. 30 tablet 2   ondansetron (ZOFRAN) 8 MG tablet Take 1 tablet (8 mg total) by mouth every 8 (eight) hours as needed for nausea.  30 tablet 3   polyethylene glycol (MIRALAX) 17 g packet Take 17 g by mouth daily. 30 each 1   prochlorperazine (COMPAZINE) 10 MG tablet Take 1 tablet (10 mg total) by mouth  every 6 (six) hours as needed for nausea or vomiting. 30 tablet 0   senna-docusate (SENOKOT-S) 8.6-50 MG tablet Take 2 tablets by mouth 2 (two) times daily. 90 tablet 1   spironolactone (ALDACTONE) 25 MG tablet Take 0.5 tablets (12.5 mg total) by mouth daily. 45 tablet 3   No current facility-administered medications for this visit.    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    12/14/2020 Imaging   CT chest abdomen and pelvis IMPRESSION:  1.  2 small nodules in the right lung, nonspecific.  2.  Prominent precarinal lymph node versus adjacent small nodes.  3.  Enlarged heterogeneous uterine fundus.  4.  No definite adenopathy in the abdomen or pelvis. Nonspecific small lymph nodes are present, not enlarged by CT criteria.  5.  Mild wall thickening of the anterior aspect of the urinary bladder which may be due to to nondistention or inflammation.    12/27/2020 Pathology Results   SPECIMEN     Procedure:    Total hysterectomy and bilateral salpingo-oophorectomy   TUMOR     Histologic Type:    Serous carcinoma     Histologic Grade:    Not applicable     Myometrial Invasion:    Present       Depth of Myometrial Invasion:    11 mm       Myometrial Thickness:    12 mm       Percentage of Myometrial Invasion:    92 %     Uterine Serosa Involvement:    Not identified     Cervical Stromal Involvement:    Not identified     Other Tissue / Organ Involvement:    Not identified     Peritoneal / Ascitic Fluid:    Not identified     Lymphovascular Invasion (LVI):    Present   MARGINS      Margin Status:    Not applicable   REGIONAL LYMPH NODES     Regional Lymph Node Status:           :    All regional lymph nodes negative for tumor cells         Lymph Nodes Examined:               Total Number of Pelvic Nodes Examined:    3           Number of Pelvic Sentinel Nodes Examined:    3           Total Number of Para-aortic Nodes Examined:    0           Number of Para-aortic Sentinel Nodes Examined:    Not applicable   DISTANT METASTASIS     Distant Site(s) Involved:    Not applicable   PATHOLOGIC STAGE CLASSIFICATION (pTNM, AJCC 8th Edition)     Reporting of pT, pN, and (when applicable) pM categories is based on information available to the pathologist at the time the report is issued. As per the AJCC (Chapter 1, 8th Ed.) it is the managing physician's responsibility to establish the final pathologic stage based upon all pertinent information, including but potentially not limited to this pathology report.     TNM Descriptors:  Not applicable     Tumor Modifier:    Not applicable     pT Category:    pT1b     Regional Lymph Nodes Modifier:    (sn)     pN Category:    pN0   The endometrial adenocarcinoma was analyzed by IHC for DNA mismatch repair proteins.  The neoplasm retained nuclear expression of all four mismatch repair proteins, MLH1, PMS2, MSH2, MSH6.    Estrogen receptors (ER): Negative Progesterone receptors (PR): Negative  Controls worked appropriately.   A HER2 stain is POSITIVE (3+).   12/27/2020 Surgery   At [facility], she underwent a robotic hysterectomy/BSO, sentinel node dissection and partial omentectomy. She was found to have an enlarged uterus. An enlarged right pelvic sentinel node.    02/23/2021 Echocardiogram   Left Ventricle: Systolic function is normal. EF: 55-60%.    Left Ventricle: Doppler parameters consistent with mild diastolic  dysfunction and low to normal LA pressure.    Left Ventricle: There is mild concentric hypertrophy.    Aortic  Valve: Mild aortic valve regurgitation.   01/27/2022 Imaging   CT chest imaging 1. No pulmonary embolus. 2. Findings consistent with intrathoracic malignancy. Multiple pulmonary nodules throughout both lungs of varying sizes, consistent with metastatic disease. Primary site of malignancy may represent a 4 cm spiculated nodule in the right perihilar region spanning the fissure that is cavitating. Alternatively all of these nodules may be metastatic given history of cancer, type not specified.  3. Multifocal mediastinal and bilateral hilar adenopathy, consistent metastatic disease. 4. Nondisplaced left anterior seventh and eighth rib fractures, soft tissue thickening adjacent to the seventh rib fracture suggests this may be pathologic 5. Right hydronephrosis is partially included in the field of view in the upper abdomen. Small soft tissue density medial to the right hepatic lobe posterior to the right kidney is nonspecific but may represent metastatic disease. Recommend staging CT of the abdomen and pelvis with oral and IV contrast. 6. Moderate emphysema. 7. Aortic atherosclerosis.  Coronary artery calcifications.   01/28/2022 Initial Diagnosis   Endometrial cancer ([place])   01/28/2022 Cancer Staging   Staging form: Corpus Uteri - Carcinoma and Carcinosarcoma, AJCC 8th Edition - Clinical stage from 01/28/2022: FIGO Stage IVB (rcT1b, cN2a, cM1) - Signed by Heath Lark, MD on 01/28/2022 Stage prefix: Recurrence   01/28/2022 Imaging   CT abdomen and pelvis 1. Multiple abdominal and pelvic metastatic implants as above. 2. Moderate right hydronephrosis secondary to mass effect and obstruction of the distal right ureter by a right pelvic metastatic implant. 3. Multiple pulmonary metastatic disease in the visualized right lung base. 4. No bowel obstruction. Normal appendix. 5. Aortic Atherosclerosis (ICD10-I70.0).   01/30/2022 Echocardiogram    1. Left ventricular ejection fraction, by estimation, is 55  to 60%. The left ventricle has normal function. The left ventricle has no regional wall motion abnormalities. There is moderate concentric left ventricular  hypertrophy. Left ventricular diastolic parameters are consistent with Grade I diastolic dysfunction (impaired relaxation).   2. Right ventricular systolic function is normal. The right ventricular size is normal. There is normal pulmonary artery systolic pressure.   3. Left atrial size was mildly dilated.   4. The mitral valve is normal in structure. Trivial mitral valve regurgitation. No evidence of mitral stenosis.   5. The aortic valve is tricuspid. Aortic valve regurgitation is trivial. No aortic stenosis is present.   6. Aortic dilatation noted. There is mild dilatation of the aortic root, measuring 39 mm.  7. The inferior vena cava is normal in size with <50% respiratory variability, suggesting right atrial pressure of 8 mmHg.    01/31/2022 - 04/04/2022 Chemotherapy   Patient is on Treatment Plan : UTERINE SEROUS CARCINOMA Carboplatin + Paclitaxel + Trastuzumab q21d x 6 Cycles / Trastuzumab q21d     01/31/2022 - 05/18/2022 Chemotherapy   Patient is on Treatment Plan : UTERINE SEROUS CARCINOMA Carboplatin + Paclitaxel + Trastuzumab q21d x 6 Cycles / Trastuzumab q21d     04/03/2022 Imaging   1. Pulmonary nodules are either reduced in size or completely resolved. No new nodules. 2. Marked reduction in size peritoneal nodular metastasis. Multiple nodular lesions are no longer measurable. 3. Interval reduction in size of implant in the RIGHT operator space. While this implant is smaller, lesion continues to obstruct the RIGHT ureter with persistent RIGHT hydronephrosis and hydroureter. 4. No new metastatic lesions are present.   05/09/2022 Echocardiogram    1. Left ventricular ejection fraction, by estimation, is 45 to 50%. The left ventricle has mildly decreased function. The left ventricle demonstrates global hypokinesis. There is moderate  concentric left ventricular hypertrophy. Left ventricular diastolic parameters are consistent with Grade I diastolic dysfunction (impaired relaxation). The average left ventricular global longitudinal strain is -10.8 %. The global longitudinal strain is abnormal.  2. Right ventricular systolic function is normal. The right ventricular size is normal.  3. The mitral valve is normal in structure. No evidence of mitral valve regurgitation.  4. The aortic valve is tricuspid. Aortic valve regurgitation is mild.  5. There is mild dilatation of the ascending aorta, measuring 39 mm.  6. The inferior vena cava is normal in size with <50% respiratory variability, suggesting right atrial pressure of 8 mmHg.   07/24/2022 Imaging   1. Improving appearance of sequela of metastatic disease. RIGHT pelvic sidewall mass obstructing the RIGHT ureter is no longer clearly seen. LEFT retroperitoneal soft tissue has diminished. 2. No signs of pulmonary metastatic disease or metastatic disease to the chest. 3. Mild RIGHT hydroureter without hydronephrosis. Resolution of hydronephrosis but with further atrophy of the RIGHT kidney. 4. Aortic atherosclerosis and 3 vessel coronary artery disease.   Aortic Atherosclerosis (ICD10-I70.0).   10/25/2022 Imaging   1. New pathologically enlarged mediastinal and left hilar lymph nodes as well as redemonstration previously treated/resolved pulmonary nodules, consistent with thoracic metastatic disease. Consider further evaluation by nuclear medicine PET-CT. 2. New subtle 14 mm hypodense lesion in the hepatic dome with increased conspicuity of a previously treated hepatic metastatic lesion in the right lobe of the liver, suspicious for new/recurrent hepatic metastatic disease. 3. Interval increase in the soft tissue thickening adjacent to loops of bowel in the right lower quadrant near the right external iliac artery, suspicious for recurrent disease. 4. Slight interval decrease in  size of the left retroperitoneal soft tissue nodule. 5. Small focus of soft tissue nodularity along the left vaginal cuff is similar prior. 6.  Aortic Atherosclerosis (ICD10-I70.0).   11/06/2022 -  Chemotherapy   Patient is on Treatment Plan : UTERINE ENDOMETRIAL Dostarlimab-gxly (500 mg) + Carboplatin (AUC 5) + Paclitaxel (175 mg/m2) q21d x 6 cycles / Dostarlimab-gxly (1000 mg) q42d x 6 cycles      Metastasis to lung ([place])  01/28/2022 Initial Diagnosis   Metastasis to lung ([place])   11/06/2022 -  Chemotherapy   Patient is on Treatment Plan : UTERINE ENDOMETRIAL Dostarlimab-gxly (500 mg) + Carboplatin (AUC 5) + Paclitaxel (175 mg/m2) q21d x 6 cycles / Dostarlimab-gxly (1000 mg)  q42d x 6 cycles        PHYSICAL EXAMINATION: ECOG PERFORMANCE STATUS: 0 - Asymptomatic  Vitals:   10/26/22 1157  BP: (!) 165/105  Pulse: 81  Resp: 18  Temp: 97.9 F (36.6 C)  SpO2: 98%   Filed Weights   10/26/22 1157  Weight: 146 lb (66.2 kg)    GENERAL:alert, no distress and comfortable  NEURO: alert & oriented x 3 with fluent speech, no focal motor/sensory deficits  LABORATORY DATA:  I have reviewed the data as listed    Component Value Date/Time   NA 138 10/24/2022 1155   NA 141 02/25/2019 1222   K 3.6 10/24/2022 1155   CL 103 10/24/2022 1155   CO2 29 10/24/2022 1155   GLUCOSE 227 (H) 10/24/2022 1155   BUN 31 (H) 10/24/2022 1155   BUN 17 02/25/2019 1222   CREATININE 1.61 (H) 10/24/2022 1155   CREATININE 1.56 (H) 05/18/2022 0910   CALCIUM 8.9 10/24/2022 1155   PROT 7.3 10/24/2022 1155   PROT 6.9 09/22/2019 1419   ALBUMIN 3.8 10/24/2022 1155   ALBUMIN 3.9 09/22/2019 1419   AST 17 10/24/2022 1155   AST 21 05/18/2022 0910   ALT 14 10/24/2022 1155   ALT 16 05/18/2022 0910   ALKPHOS 53 10/24/2022 1155   BILITOT 0.3 10/24/2022 1155   BILITOT 0.3 05/18/2022 0910   GFRNONAA 37 (L) 10/24/2022 1155   GFRNONAA 38 (L) 05/18/2022 0910   GFRAA >60 05/16/2019 0335    No results found for:  "SPEP", "UPEP"  Lab Results  Component Value Date   WBC 5.3 10/24/2022   NEUTROABS 2.2 10/24/2022   HGB 12.4 10/24/2022   HCT 37.2 10/24/2022   MCV 84.7 10/24/2022   PLT 180 10/24/2022      Chemistry      Component Value Date/Time   NA 138 10/24/2022 1155   NA 141 02/25/2019 1222   K 3.6 10/24/2022 1155   CL 103 10/24/2022 1155   CO2 29 10/24/2022 1155   BUN 31 (H) 10/24/2022 1155   BUN 17 02/25/2019 1222   CREATININE 1.61 (H) 10/24/2022 1155   CREATININE 1.56 (H) 05/18/2022 0910      Component Value Date/Time   CALCIUM 8.9 10/24/2022 1155   ALKPHOS 53 10/24/2022 1155   AST 17 10/24/2022 1155   AST 21 05/18/2022 0910   ALT 14 10/24/2022 1155   ALT 16 05/18/2022 0910   BILITOT 0.3 10/24/2022 1155   BILITOT 0.3 05/18/2022 0910       RADIOGRAPHIC STUDIES:I have reviewed CT with patient and her mother I have personally reviewed the radiological images as listed and agreed with the findings in the report. CT CHEST ABDOMEN PELVIS W CONTRAST  Result Date: 10/24/2022 CLINICAL DATA:  Endometrial/cervical cancer, monitor. * Tracking Code: BO * EXAM: CT CHEST, ABDOMEN, AND PELVIS WITH CONTRAST TECHNIQUE: Multidetector CT imaging of the chest, abdomen and pelvis was performed following the standard protocol during bolus administration of intravenous contrast. RADIATION DOSE REDUCTION: This exam was performed according to the departmental dose-optimization program which includes automated exposure control, adjustment of the mA and/or kV according to patient size and/or use of iterative reconstruction technique. CONTRAST:  19m OMNIPAQUE IOHEXOL 300 MG/ML  SOLN COMPARISON:  Multiple priors including most recent CT July 21, 2022 FINDINGS: CT CHEST FINDINGS Cardiovascular: Accessed right chest Port-A-Cath with tip in the right atrium. Aortic atherosclerosis. Coronary artery calcifications/stents. Normal size heart. No significant pericardial effusion/thickening. Mediastinum/Nodes: No  suspicious thyroid nodule. New pathologically  enlarged mediastinal and left hilar lymph nodes. For reference: -centrally necrotic left hilar lymph node measures 14 mm in short axis on image 26/2. -centrally necrotic precarinal lymph node measures 13 mm in short axis on image 23/2. The esophagus is grossly unremarkable. Lungs/Pleura: Bilateral pulmonary nodules are present corresponding in location with the pulmonary nodules/masses seen on CT January 27, 2022 but which appear to reflect areas of scarring on recent prior imaging. For reference: -right lower lobe pulmonary nodule measures 10 mm on image 58/6. -left upper lobe near nodule measures 13 mm on image 55/6. No pleural effusion.  No pneumothorax. Musculoskeletal: No aggressive lytic or blastic lesion of bone. CT ABDOMEN PELVIS FINDINGS Hepatobiliary: Subtle hypodense lesion in the hepatic dome measures 14 mm on image 47/2. Increased conspicuity of the previously resolved lesion along the posteromedial right lobe of the liver in segment VIII 7 on image 51/2 now measuring 7 mm. Gallbladder is unremarkable. No biliary ductal dilation. Pancreas: No pancreatic ductal dilation or evidence of acute inflammation. Spleen: No splenomegaly. Adrenals/Urinary Tract: Bilateral adrenal glands appear normal. No hydronephrosis. Kidneys demonstrate symmetric enhancement and excretion of contrast material. Similar right renal cortical atrophy. Urinary bladder is unremarkable for degree of distension. Stomach/Bowel: Radiopaque enteric contrast material traverses distal loops of small bowel. Stomach is nondistended limiting evaluation. No pathologic dilation of small or large bowel. Normal appendix. Moderate volume of formed stool in the colon. No evidence of acute bowel inflammation. Vascular/Lymphatic: Aortic atherosclerosis. 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 Smooth IVC contours. No  pathologically enlarged abdominal or pelvic lymph nodes. Reproductive: Uterus is surgically absent. Small focus of soft tissue nodularity along the left vaginal cuff measuring 12 x 7 mm on image 98/2 is similar prior. Other: Interval increase in the soft tissue thickening adjacent to loops of bowel in the right lower quadrant near the right external iliac artery measuring 8 mm on image 93/5 previously 5 mm. No significant abdominopelvic free fluid. Decreased size of the left retroperitoneal soft tissue nodule now measuring 7 mm on image 70/2 previously 9 mm. Musculoskeletal: No aggressive lytic or blastic lesion of bone. Similar appearance of the probable hemangioma in the L2 vertebral body. Multilevel degenerative changes spine. IMPRESSION: 1. New pathologically enlarged mediastinal and left hilar lymph nodes as well as redemonstration previously treated/resolved pulmonary nodules, consistent with thoracic metastatic disease. Consider further evaluation by nuclear medicine PET-CT. 2. New subtle 14 mm hypodense lesion in the hepatic dome with increased conspicuity of a previously treated hepatic metastatic lesion in the right lobe of the liver, suspicious for new/recurrent hepatic metastatic disease. 3. Interval increase in the soft tissue thickening adjacent to loops of bowel in the right lower quadrant near the right external iliac artery, suspicious for recurrent disease. 4. Slight interval decrease in size of the left retroperitoneal soft tissue nodule. 5. Small focus of soft tissue nodularity along the left vaginal cuff is similar prior. 6.  Aortic Atherosclerosis (ICD10-I70.0). These results will be called to the ordering clinician or representative by the Radiologist Assistant, and communication documented in the PACS or [employer]. Electronically Signed   By: Dahlia Bailiff M.D.   On: 10/24/2022 17:01

## 2022-10-26 NOTE — Assessment & Plan Note (Signed)
We discussed importance of smoking cessation

## 2022-10-27 ENCOUNTER — Telehealth: Payer: Self-pay | Admitting: Hematology and Oncology

## 2022-10-27 NOTE — Telephone Encounter (Signed)
Patient a aware of upcoming appointments

## 2022-10-30 NOTE — Progress Notes (Signed)
Pharmacist Chemotherapy Monitoring - Initial Assessment    Anticipated start date: 11/06/22    The following has been reviewed per standard work regarding the patient's treatment regimen: The patient's diagnosis, treatment plan and drug doses, and organ/hematologic function Lab orders and baseline tests specific to treatment regimen  The treatment plan start date, drug sequencing, and pre-medications Prior authorization status  Patient's documented medication list, including drug-drug interaction screen and prescriptions for anti-emetics and supportive care specific to the treatment regimen The drug concentrations, fluid compatibility, administration routes, and timing of the medications to be used The patient's access for treatment and lifetime cumulative dose history, if applicable  The patient's medication allergies and previous infusion related reactions, if applicable   Changes made to treatment plan:  N/A  Follow up needed:  Pending authorization for treatment    Acquanetta Belling, RPH,  BCPS, BCOP 10/30/2022  4:15 PM

## 2022-11-03 MED FILL — Fosaprepitant Dimeglumine For IV Infusion 150 MG (Base Eq): INTRAVENOUS | Qty: 5 | Status: AC

## 2022-11-03 MED FILL — Dexamethasone Sodium Phosphate Inj 100 MG/10ML: INTRAMUSCULAR | Qty: 1 | Status: AC

## 2022-11-06 ENCOUNTER — Inpatient Hospital Stay: Payer: Medicare Other

## 2022-11-06 ENCOUNTER — Other Ambulatory Visit: Payer: Self-pay

## 2022-11-06 VITALS — BP 177/92 | HR 55 | Temp 98.5°F | Resp 18 | Wt 145.5 lb

## 2022-11-06 DIAGNOSIS — C541 Malignant neoplasm of endometrium: Secondary | ICD-10-CM

## 2022-11-06 DIAGNOSIS — C7801 Secondary malignant neoplasm of right lung: Secondary | ICD-10-CM

## 2022-11-06 DIAGNOSIS — Z5112 Encounter for antineoplastic immunotherapy: Secondary | ICD-10-CM | POA: Diagnosis not present

## 2022-11-06 LAB — CMP (CANCER CENTER ONLY)
ALT: 16 U/L (ref 0–44)
AST: 19 U/L (ref 15–41)
Albumin: 4.1 g/dL (ref 3.5–5.0)
Alkaline Phosphatase: 60 U/L (ref 38–126)
Anion gap: 9 (ref 5–15)
BUN: 27 mg/dL — ABNORMAL HIGH (ref 6–20)
CO2: 26 mmol/L (ref 22–32)
Calcium: 9.2 mg/dL (ref 8.9–10.3)
Chloride: 102 mmol/L (ref 98–111)
Creatinine: 1.58 mg/dL — ABNORMAL HIGH (ref 0.44–1.00)
GFR, Estimated: 37 mL/min — ABNORMAL LOW (ref >60)
Glucose, Bld: 238 mg/dL — ABNORMAL HIGH (ref 70–99)
Potassium: 4.2 mmol/L (ref 3.5–5.1)
Sodium: 137 mmol/L (ref 135–145)
Total Bilirubin: 0.4 mg/dL (ref 0.3–1.2)
Total Protein: 7.7 g/dL (ref 6.5–8.1)

## 2022-11-06 LAB — CBC WITH DIFFERENTIAL (CANCER CENTER ONLY)
Abs Immature Granulocytes: 0.01 10*3/uL (ref 0.00–0.07)
Basophils Absolute: 0 10*3/uL (ref 0.0–0.1)
Basophils Relative: 0 %
Eosinophils Absolute: 0 10*3/uL (ref 0.0–0.5)
Eosinophils Relative: 0 %
HCT: 39 % (ref 36.0–46.0)
Hemoglobin: 13.2 g/dL (ref 12.0–15.0)
Immature Granulocytes: 0 %
Lymphocytes Relative: 22 %
Lymphs Abs: 1.1 10*3/uL (ref 0.7–4.0)
MCH: 28.6 pg (ref 26.0–34.0)
MCHC: 33.8 g/dL (ref 30.0–36.0)
MCV: 84.4 fL (ref 80.0–100.0)
Monocytes Absolute: 0.1 10*3/uL (ref 0.1–1.0)
Monocytes Relative: 2 %
Neutro Abs: 3.8 10*3/uL (ref 1.7–7.7)
Neutrophils Relative %: 76 %
Platelet Count: 198 10*3/uL (ref 150–400)
RBC: 4.62 MIL/uL (ref 3.87–5.11)
RDW: 14.6 % (ref 11.5–15.5)
WBC Count: 5 10*3/uL (ref 4.0–10.5)
nRBC: 0 % (ref 0.0–0.2)

## 2022-11-06 LAB — TSH: TSH: 0.642 u[IU]/mL (ref 0.350–4.500)

## 2022-11-06 MED ORDER — SODIUM CHLORIDE 0.9 % IV SOLN
325.5000 mg | Freq: Once | INTRAVENOUS | Status: AC
  Administered 2022-11-06: 330 mg via INTRAVENOUS
  Filled 2022-11-06: qty 33

## 2022-11-06 MED ORDER — SODIUM CHLORIDE 0.9 % IV SOLN
131.2500 mg/m2 | Freq: Once | INTRAVENOUS | Status: AC
  Administered 2022-11-06: 228 mg via INTRAVENOUS
  Filled 2022-11-06: qty 38

## 2022-11-06 MED ORDER — SODIUM CHLORIDE 0.9 % IV SOLN: Freq: Once | INTRAVENOUS | Status: AC

## 2022-11-06 MED ORDER — SODIUM CHLORIDE 0.9 % IV SOLN
500.0000 mg | Freq: Once | INTRAVENOUS | Status: AC
  Administered 2022-11-06: 500 mg via INTRAVENOUS
  Filled 2022-11-06: qty 10

## 2022-11-06 MED ORDER — FAMOTIDINE IN NACL 20-0.9 MG/50ML-% IV SOLN
20.0000 mg | Freq: Once | INTRAVENOUS | Status: AC
  Administered 2022-11-06: 20 mg via INTRAVENOUS
  Filled 2022-11-06: qty 50

## 2022-11-06 MED ORDER — SODIUM CHLORIDE 0.9 % IV SOLN
10.0000 mg | Freq: Once | INTRAVENOUS | Status: AC
  Administered 2022-11-06: 10 mg via INTRAVENOUS
  Filled 2022-11-06: qty 10

## 2022-11-06 MED ORDER — HEPARIN SOD (PORK) LOCK FLUSH 100 UNIT/ML IV SOLN
500.0000 [IU] | Freq: Once | INTRAVENOUS | Status: AC | PRN
  Administered 2022-11-06: 500 [IU]

## 2022-11-06 MED ORDER — PALONOSETRON HCL INJECTION 0.25 MG/5ML
0.2500 mg | Freq: Once | INTRAVENOUS | Status: AC
  Administered 2022-11-06: 0.25 mg via INTRAVENOUS
  Filled 2022-11-06: qty 5

## 2022-11-06 MED ORDER — SODIUM CHLORIDE 0.9% FLUSH
10.0000 mL | INTRAVENOUS | Status: DC | PRN
  Administered 2022-11-06: 10 mL

## 2022-11-06 MED ORDER — CETIRIZINE HCL 10 MG/ML IV SOLN
10.0000 mg | Freq: Once | INTRAVENOUS | Status: AC
  Administered 2022-11-06: 10 mg via INTRAVENOUS
  Filled 2022-11-06: qty 1

## 2022-11-06 MED ORDER — SODIUM CHLORIDE 0.9 % IV SOLN
150.0000 mg | Freq: Once | INTRAVENOUS | Status: AC
  Administered 2022-11-06: 150 mg via INTRAVENOUS
  Filled 2022-11-06: qty 150

## 2022-11-06 NOTE — Progress Notes (Signed)
Patient was asymptomatic with her 7th carboplatin, but ending BP initially was 189/129. Patient was observed for about 10 minutes and her BP went down to 177/92. Patient insisted that she felt fine. Since her blood pressure was going down- allowed to leave, but patient urged to recheck when she gets home (patient does have BP machine). Patient advised that if she gets higher again or has stroke symptoms or a headache with high BP that she should go to the ED.

## 2022-11-06 NOTE — Patient Instructions (Signed)
[facility]  Discharge Instructions: Thank you for choosing [facility] to provide your oncology and hematology care.   If you have a lab appointment with the [facility], please go directly to the [facility] and check in at the registration area.   Wear comfortable clothing and clothing appropriate for easy access to any Portacath or PICC line.   We strive to give you quality time with your provider. You may need to reschedule your appointment if you arrive late (15 or more minutes).  Arriving late affects you and other patients whose appointments are after yours.  Also, if you miss three or more appointments without notifying the office, you may be dismissed from the clinic at the provider's discretion.      For prescription refill requests, have your pharmacy contact our office and allow 72 hours for refills to be completed.    Today you received the following chemotherapy and/or immunotherapy agents: Dostarlimab (Jemperli), Carboplatin, Paclitaxel      To help prevent nausea and vomiting after your treatment, we encourage you to take your nausea medication as directed.  BELOW ARE SYMPTOMS THAT SHOULD BE REPORTED IMMEDIATELY: *FEVER GREATER THAN 100.4 F (38 C) OR HIGHER *CHILLS OR SWEATING *NAUSEA AND VOMITING THAT IS NOT CONTROLLED WITH YOUR NAUSEA MEDICATION *UNUSUAL SHORTNESS OF BREATH *UNUSUAL BRUISING OR BLEEDING *URINARY PROBLEMS (pain or burning when urinating, or frequent urination) *BOWEL PROBLEMS (unusual diarrhea, constipation, pain near the anus) TENDERNESS IN MOUTH AND THROAT WITH OR WITHOUT PRESENCE OF ULCERS (sore throat, sores in mouth, or a toothache) UNUSUAL RASH, SWELLING OR PAIN  UNUSUAL VAGINAL DISCHARGE OR ITCHING   Items with * indicate a potential emergency and should be followed up as soon as possible or go to the Emergency Department if any problems should occur.  Please show the CHEMOTHERAPY ALERT  CARD or IMMUNOTHERAPY ALERT CARD at check-in to the Emergency Department and triage nurse.  Should you have questions after your visit or need to cancel or reschedule your appointment, please contact [facility]  Dept: [phone]  and follow the prompts.  Office hours are 8:00 a.m. to 4:30 p.m. Monday - Friday. Please note that voicemails left after 4:00 p.m. may not be returned until the following business day.  We are closed weekends and major holidays. You have access to a nurse at all times for urgent questions. Please call the main number to the clinic Dept: [phone] and follow the prompts.   For any non-urgent questions, you may also contact your provider using MyChart. We now offer e-Visits for anyone 40 and older to request care online for non-urgent symptoms. For details visit [email or web address].   Also download the MyChart app! Go to the app store, search "MyChart", open the app, select [facility], and log in with your MyChart username and password.  Masks are optional in the cancer centers. If you would like for your care team to wear a mask while they are taking care of you, please let them know. You may have one support person who is at least 60 years old accompany you for your appointments. Dostarlimab Injection What is this medication? DOSTARLIMAB (dos tar li mab) treats some types of cancer. It works by helping your immune system slow or stop the spread of cancer cells. It is a monoclonal antibody. This medicine may be used for other purposes; ask your health care provider or pharmacist if you have questions.  COMMON BRAND NAME(S): Jemperli What should I tell my care team before I take this medication? They need to know if you have any of these conditions: Allogeneic stem cell transplant (uses someone else's stem cells) Autoimmune diseases, such as Crohn disease, ulcerative colitis, lupus History of chest radiation Nervous system  problems, such as Guillain-Barre syndrome, myasthenia gravis Organ transplant An unusual or allergic reaction to dostarlimab, other medications, foods, dyes, or preservatives Pregnant or trying to get pregnant Breast-feeding How should I use this medication? This medication is injected into a vein. It is given by your care team in a hospital or clinic setting. A special MedGuide will be given to you before each treatment. Be sure to read this information carefully each time. Talk to your care team about the use of this medication in children. Special care may be needed. Overdosage: If you think you have taken too much of this medicine contact a poison control center or emergency room at once. NOTE: This medicine is only for you. Do not share this medicine with others. What if I miss a dose? Keep appointments for follow-up doses. It is important not to miss your dose. Call your care team if you are unable to keep an appointment. What may interact with this medication? Interactions have not been studied. This list may not describe all possible interactions. Give your health care provider a list of all the medicines, herbs, non-prescription drugs, or dietary supplements you use. Also tell them if you smoke, drink alcohol, or use illegal drugs. Some items may interact with your medicine. What should I watch for while using this medication? Your condition will be monitored carefully while you are receiving this medication. You may need blood work while taking this medication. This medication may cause serious skin reactions. They can happen weeks to months after starting the medication. Contact your care team right away if you notice fevers or flu-like symptoms with a rash. The rash may be red or purple and then turn into blisters or peeling of the skin. You may also notice a red rash with swelling of the face, lips, or lymph nodes in your neck or under your arms. Tell your care team right away if you  have any change in your eyesight. Talk to your care team if you may be pregnant. Serious birth defects can occur if you take this medication during pregnancy and for 4 months after the last dose. You will need a negative pregnancy test before starting this medication. Contraception is recommended while taking this medication and for 4 months after the last dose. Your care team can help you find the option that works for you. Do not breastfeed while taking this medication and for 4 months after the last dose. What side effects may I notice from receiving this medication? Side effects that you should report to your care team as soon as possible: Allergic reactions--skin rash, itching, hives, swelling of the face, lips, tongue, or throat Dry cough, shortness of breath or trouble breathing Eye pain, redness, irritation, or discharge with blurry or decreased vision Heart muscle inflammation--unusual weakness or fatigue, shortness of breath, chest pain, fast or irregular heartbeat, dizziness, swelling of the ankles, feet, or hands Hormone gland problems--headache, sensitivity to light, unusual weakness or fatigue, dizziness, fast or irregular heartbeat, increased sensitivity to cold or heat, excessive sweating, constipation, hair loss, increased thirst or amount of urine, tremors or shaking, irritability Infusion reactions--chest pain, shortness of breath or trouble breathing, feeling faint or lightheaded Kidney  injury (glomerulonephritis)--decrease in the amount of urine, red or dark brown urine, foamy or bubbly urine, swelling of the ankles, hands, or feet Liver injury--right upper belly pain, loss of appetite, nausea, light-colored stool, dark yellow or brown urine, yellowing skin or eyes, unusual weakness or fatigue Pain, tingling, or numbness in the hands or feet, muscle weakness, change in vision, confusion or trouble speaking, loss of balance or coordination, trouble walking, seizures Rash, fever, and  swollen lymph nodes Redness, blistering, peeling, or loosening of the skin, including inside the mouth Sudden or severe stomach pain, bloody diarrhea, fever, nausea, vomiting Side effects that usually do not require medical attention (report these to your care team if they continue or are bothersome): Bone, joint, or muscle pain Diarrhea Fatigue Loss of appetite Nausea Skin rash This list may not describe all possible side effects. Call your doctor for medical advice about side effects. You may report side effects to FDA at 1-800-FDA-1088. Where should I keep my medication? This medication is given in a hospital or clinic. It will not be stored at home. NOTE: This sheet is a summary. It may not cover all possible information. If you have questions about this medicine, talk to your doctor, pharmacist, or health care provider.  2023 Elsevier/Gold Standard (2021-10-13 00:00:00)

## 2022-11-07 LAB — T4: T4, Total: 7.4 ug/dL (ref 4.5–12.0)

## 2022-11-23 ENCOUNTER — Encounter: Payer: Self-pay | Admitting: Hematology and Oncology

## 2022-11-24 MED FILL — Fosaprepitant Dimeglumine For IV Infusion 150 MG (Base Eq): INTRAVENOUS | Qty: 5 | Status: AC

## 2022-11-24 MED FILL — Dexamethasone Sodium Phosphate Inj 100 MG/10ML: INTRAMUSCULAR | Qty: 1 | Status: AC

## 2022-11-27 ENCOUNTER — Inpatient Hospital Stay: Payer: Medicare HMO | Attending: Hematology and Oncology

## 2022-11-27 ENCOUNTER — Encounter: Payer: Self-pay | Admitting: Hematology and Oncology

## 2022-11-27 ENCOUNTER — Inpatient Hospital Stay: Payer: Medicare HMO

## 2022-11-27 ENCOUNTER — Other Ambulatory Visit: Payer: Self-pay

## 2022-11-27 ENCOUNTER — Inpatient Hospital Stay (HOSPITAL_BASED_OUTPATIENT_CLINIC_OR_DEPARTMENT_OTHER): Payer: Medicare HMO | Admitting: Hematology and Oncology

## 2022-11-27 VITALS — BP 151/60 | HR 62 | Temp 98.0°F | Resp 18

## 2022-11-27 VITALS — BP 171/62 | HR 62 | Temp 97.4°F | Resp 18 | Ht 63.0 in | Wt 146.0 lb

## 2022-11-27 DIAGNOSIS — C7801 Secondary malignant neoplasm of right lung: Secondary | ICD-10-CM

## 2022-11-27 DIAGNOSIS — I10 Essential (primary) hypertension: Secondary | ICD-10-CM

## 2022-11-27 DIAGNOSIS — C7802 Secondary malignant neoplasm of left lung: Secondary | ICD-10-CM

## 2022-11-27 DIAGNOSIS — K5909 Other constipation: Secondary | ICD-10-CM

## 2022-11-27 DIAGNOSIS — E1122 Type 2 diabetes mellitus with diabetic chronic kidney disease: Secondary | ICD-10-CM | POA: Diagnosis not present

## 2022-11-27 DIAGNOSIS — Z5111 Encounter for antineoplastic chemotherapy: Secondary | ICD-10-CM | POA: Insufficient documentation

## 2022-11-27 DIAGNOSIS — C541 Malignant neoplasm of endometrium: Secondary | ICD-10-CM

## 2022-11-27 DIAGNOSIS — C7800 Secondary malignant neoplasm of unspecified lung: Secondary | ICD-10-CM | POA: Insufficient documentation

## 2022-11-27 DIAGNOSIS — N183 Chronic kidney disease, stage 3 unspecified: Secondary | ICD-10-CM | POA: Insufficient documentation

## 2022-11-27 DIAGNOSIS — Z5112 Encounter for antineoplastic immunotherapy: Secondary | ICD-10-CM | POA: Diagnosis present

## 2022-11-27 DIAGNOSIS — E1140 Type 2 diabetes mellitus with diabetic neuropathy, unspecified: Secondary | ICD-10-CM

## 2022-11-27 LAB — CBC WITH DIFFERENTIAL (CANCER CENTER ONLY)
Abs Immature Granulocytes: 0.02 10*3/uL (ref 0.00–0.07)
Basophils Absolute: 0 10*3/uL (ref 0.0–0.1)
Basophils Relative: 0 %
Eosinophils Absolute: 0 10*3/uL (ref 0.0–0.5)
Eosinophils Relative: 0 %
HCT: 38.4 % (ref 36.0–46.0)
Hemoglobin: 12.8 g/dL (ref 12.0–15.0)
Immature Granulocytes: 0 %
Lymphocytes Relative: 22 %
Lymphs Abs: 1.1 10*3/uL (ref 0.7–4.0)
MCH: 28.3 pg (ref 26.0–34.0)
MCHC: 33.3 g/dL (ref 30.0–36.0)
MCV: 84.8 fL (ref 80.0–100.0)
Monocytes Absolute: 0.1 10*3/uL (ref 0.1–1.0)
Monocytes Relative: 1 %
Neutro Abs: 3.7 10*3/uL (ref 1.7–7.7)
Neutrophils Relative %: 77 %
Platelet Count: 269 10*3/uL (ref 150–400)
RBC: 4.53 MIL/uL (ref 3.87–5.11)
RDW: 14.9 % (ref 11.5–15.5)
WBC Count: 4.8 10*3/uL (ref 4.0–10.5)
nRBC: 0 % (ref 0.0–0.2)

## 2022-11-27 LAB — CMP (CANCER CENTER ONLY)
ALT: 13 U/L (ref 0–44)
AST: 15 U/L (ref 15–41)
Albumin: 3.9 g/dL (ref 3.5–5.0)
Alkaline Phosphatase: 58 U/L (ref 38–126)
Anion gap: 6 (ref 5–15)
BUN: 26 mg/dL — ABNORMAL HIGH (ref 6–20)
CO2: 26 mmol/L (ref 22–32)
Calcium: 9.5 mg/dL (ref 8.9–10.3)
Chloride: 105 mmol/L (ref 98–111)
Creatinine: 1.45 mg/dL — ABNORMAL HIGH (ref 0.44–1.00)
GFR, Estimated: 42 mL/min — ABNORMAL LOW (ref >60)
Glucose, Bld: 296 mg/dL — ABNORMAL HIGH (ref 70–99)
Potassium: 4.2 mmol/L (ref 3.5–5.1)
Sodium: 137 mmol/L (ref 135–145)
Total Bilirubin: 0.4 mg/dL (ref 0.3–1.2)
Total Protein: 8 g/dL (ref 6.5–8.1)

## 2022-11-27 MED ORDER — CETIRIZINE HCL 10 MG/ML IV SOLN
10.0000 mg | Freq: Once | INTRAVENOUS | Status: AC
  Administered 2022-11-27: 10 mg via INTRAVENOUS
  Filled 2022-11-27: qty 1

## 2022-11-27 MED ORDER — SODIUM CHLORIDE 0.9 % IV SOLN: Freq: Once | INTRAVENOUS | Status: AC

## 2022-11-27 MED ORDER — SODIUM CHLORIDE 0.9 % IV SOLN
500.0000 mg | Freq: Once | INTRAVENOUS | Status: AC
  Administered 2022-11-27: 500 mg via INTRAVENOUS
  Filled 2022-11-27: qty 10

## 2022-11-27 MED ORDER — INSULIN ASPART 100 UNIT/ML IJ SOLN
10.0000 [IU] | Freq: Once | INTRAMUSCULAR | Status: AC
  Administered 2022-11-27: 10 [IU] via SUBCUTANEOUS
  Filled 2022-11-27: qty 1

## 2022-11-27 MED ORDER — SODIUM CHLORIDE 0.9 % IV SOLN
131.2500 mg/m2 | Freq: Once | INTRAVENOUS | Status: AC
  Administered 2022-11-27: 228 mg via INTRAVENOUS
  Filled 2022-11-27: qty 38

## 2022-11-27 MED ORDER — SODIUM CHLORIDE 0.9 % IV SOLN
343.5000 mg | Freq: Once | INTRAVENOUS | Status: AC
  Administered 2022-11-27: 340 mg via INTRAVENOUS
  Filled 2022-11-27: qty 34

## 2022-11-27 MED ORDER — SODIUM CHLORIDE 0.9 % IV SOLN
10.0000 mg | Freq: Once | INTRAVENOUS | Status: AC
  Administered 2022-11-27: 10 mg via INTRAVENOUS
  Filled 2022-11-27: qty 10

## 2022-11-27 MED ORDER — PALONOSETRON HCL INJECTION 0.25 MG/5ML
0.2500 mg | Freq: Once | INTRAVENOUS | Status: AC
  Administered 2022-11-27: 0.25 mg via INTRAVENOUS
  Filled 2022-11-27: qty 5

## 2022-11-27 MED ORDER — FAMOTIDINE IN NACL 20-0.9 MG/50ML-% IV SOLN
20.0000 mg | Freq: Once | INTRAVENOUS | Status: AC
  Administered 2022-11-27: 20 mg via INTRAVENOUS
  Filled 2022-11-27: qty 50

## 2022-11-27 MED ORDER — SPIRONOLACTONE 25 MG PO TABS: 25.0000 mg | ORAL_TABLET | Freq: Every day | ORAL | 3 refills | Status: DC

## 2022-11-27 MED ORDER — SODIUM CHLORIDE 0.9 % IV SOLN
150.0000 mg | Freq: Once | INTRAVENOUS | Status: AC
  Administered 2022-11-27: 150 mg via INTRAVENOUS
  Filled 2022-11-27: qty 150

## 2022-11-27 NOTE — Assessment & Plan Note (Signed)
We discussed importance of taking all the regular laxatives while on treatment

## 2022-11-27 NOTE — Patient Instructions (Signed)
[facility]  Discharge Instructions: Thank you for choosing [facility] to provide your oncology and hematology care.   If you have a lab appointment with the Cancer Center, please go directly to the Cancer Center and check in at the registration area.   Wear comfortable clothing and clothing appropriate for easy access to any Portacath or PICC line.   We strive to give you quality time with your provider. You may need to reschedule your appointment if you arrive late (15 or more minutes).  Arriving late affects you and other patients whose appointments are after yours.  Also, if you miss three or more appointments without notifying the office, you may be dismissed from the clinic at the provider's discretion.      For prescription refill requests, have your pharmacy contact our office and allow 72 hours for refills to be completed.    Today you received the following chemotherapy and/or immunotherapy agents: Dostarlimab (Jemperli), Carboplatin, Paclitaxel      To help prevent nausea and vomiting after your treatment, we encourage you to take your nausea medication as directed.  BELOW ARE SYMPTOMS THAT SHOULD BE REPORTED IMMEDIATELY: *FEVER GREATER THAN 100.4 F (38 C) OR HIGHER *CHILLS OR SWEATING *NAUSEA AND VOMITING THAT IS NOT CONTROLLED WITH YOUR NAUSEA MEDICATION *UNUSUAL SHORTNESS OF BREATH *UNUSUAL BRUISING OR BLEEDING *URINARY PROBLEMS (pain or burning when urinating, or frequent urination) *BOWEL PROBLEMS (unusual diarrhea, constipation, pain near the anus) TENDERNESS IN MOUTH AND THROAT WITH OR WITHOUT PRESENCE OF ULCERS (sore throat, sores in mouth, or a toothache) UNUSUAL RASH, SWELLING OR PAIN  UNUSUAL VAGINAL DISCHARGE OR ITCHING   Items with * indicate a potential emergency and should be followed up as soon as possible or go to the Emergency Department if any problems should occur.  Please show the CHEMOTHERAPY ALERT  CARD or IMMUNOTHERAPY ALERT CARD at check-in to the Emergency Department and triage nurse.  Should you have questions after your visit or need to cancel or reschedule your appointment, please contact [facility]  Dept: [phone]  and follow the prompts.  Office hours are 8:00 a.m. to 4:30 p.m. Monday - Friday. Please note that voicemails left after 4:00 p.m. may not be returned until the following business day.  We are closed weekends and major holidays. You have access to a nurse at all times for urgent questions. Please call the main number to the clinic Dept: [phone] and follow the prompts.   For any non-urgent questions, you may also contact your provider using MyChart. We now offer e-Visits for anyone 18 and older to request care online for non-urgent symptoms. For details visit [email or web address].   Also download the MyChart app! Go to the app store, search "MyChart", open the app, select [facility], and log in with your MyChart username and password.  Masks are optional in the cancer centers. If you would like for your care team to wear a mask while they are taking care of you, please let them know. You may have one support person who is at least 60 years old accompany you for your appointments. Dostarlimab Injection What is this medication? DOSTARLIMAB (dos tar li mab) treats some types of cancer. It works by helping your immune system slow or stop the spread of cancer cells. It is a monoclonal antibody. This medicine may be used for other purposes; ask your health care provider or pharmacist if you have questions.   COMMON BRAND NAME(S): Jemperli What should I tell my care team before I take this medication? They need to know if you have any of these conditions: Allogeneic stem cell transplant (uses someone else's stem cells) Autoimmune diseases, such as Crohn disease, ulcerative colitis, lupus History of chest radiation Nervous system  problems, such as Guillain-Barre syndrome, myasthenia gravis Organ transplant An unusual or allergic reaction to dostarlimab, other medications, foods, dyes, or preservatives Pregnant or trying to get pregnant Breast-feeding How should I use this medication? This medication is injected into a vein. It is given by your care team in a hospital or clinic setting. A special MedGuide will be given to you before each treatment. Be sure to read this information carefully each time. Talk to your care team about the use of this medication in children. Special care may be needed. Overdosage: If you think you have taken too much of this medicine contact a poison control center or emergency room at once. NOTE: This medicine is only for you. Do not share this medicine with others. What if I miss a dose? Keep appointments for follow-up doses. It is important not to miss your dose. Call your care team if you are unable to keep an appointment. What may interact with this medication? Interactions have not been studied. This list may not describe all possible interactions. Give your health care provider a list of all the medicines, herbs, non-prescription drugs, or dietary supplements you use. Also tell them if you smoke, drink alcohol, or use illegal drugs. Some items may interact with your medicine. What should I watch for while using this medication? Your condition will be monitored carefully while you are receiving this medication. You may need blood work while taking this medication. This medication may cause serious skin reactions. They can happen weeks to months after starting the medication. Contact your care team right away if you notice fevers or flu-like symptoms with a rash. The rash may be red or purple and then turn into blisters or peeling of the skin. You may also notice a red rash with swelling of the face, lips, or lymph nodes in your neck or under your arms. Tell your care team right away if you  have any change in your eyesight. Talk to your care team if you may be pregnant. Serious birth defects can occur if you take this medication during pregnancy and for 4 months after the last dose. You will need a negative pregnancy test before starting this medication. Contraception is recommended while taking this medication and for 4 months after the last dose. Your care team can help you find the option that works for you. Do not breastfeed while taking this medication and for 4 months after the last dose. What side effects may I notice from receiving this medication? Side effects that you should report to your care team as soon as possible: Allergic reactions--skin rash, itching, hives, swelling of the face, lips, tongue, or throat Dry cough, shortness of breath or trouble breathing Eye pain, redness, irritation, or discharge with blurry or decreased vision Heart muscle inflammation--unusual weakness or fatigue, shortness of breath, chest pain, fast or irregular heartbeat, dizziness, swelling of the ankles, feet, or hands Hormone gland problems--headache, sensitivity to light, unusual weakness or fatigue, dizziness, fast or irregular heartbeat, increased sensitivity to cold or heat, excessive sweating, constipation, hair loss, increased thirst or amount of urine, tremors or shaking, irritability Infusion reactions--chest pain, shortness of breath or trouble breathing, feeling faint or lightheaded Kidney   injury (glomerulonephritis)--decrease in the amount of urine, red or dark brown urine, foamy or bubbly urine, swelling of the ankles, hands, or feet Liver injury--right upper belly pain, loss of appetite, nausea, light-colored stool, dark yellow or brown urine, yellowing skin or eyes, unusual weakness or fatigue Pain, tingling, or numbness in the hands or feet, muscle weakness, change in vision, confusion or trouble speaking, loss of balance or coordination, trouble walking, seizures Rash, fever, and  swollen lymph nodes Redness, blistering, peeling, or loosening of the skin, including inside the mouth Sudden or severe stomach pain, bloody diarrhea, fever, nausea, vomiting Side effects that usually do not require medical attention (report these to your care team if they continue or are bothersome): Bone, joint, or muscle pain Diarrhea Fatigue Loss of appetite Nausea Skin rash This list may not describe all possible side effects. Call your doctor for medical advice about side effects. You may report side effects to FDA at 1-800-FDA-1088. Where should I keep my medication? This medication is given in a hospital or clinic. It will not be stored at home. NOTE: This sheet is a summary. It may not cover all possible information. If you have questions about this medicine, talk to your doctor, pharmacist, or health care provider.  2023 Elsevier/Gold Standard (2021-10-13 00:00:00)  

## 2022-11-27 NOTE — Assessment & Plan Note (Signed)
She has intermittent acute on chronic renal failure We discussed importance of hydration and risk factor modification She can proceed as long as creatinine is less than 2 

## 2022-11-27 NOTE — Assessment & Plan Note (Signed)
She has poorly controlled hypertension and is not taking care of herself I recommend she takes full dose losartan and we will monitor her kidney function and potassium level carefully

## 2022-11-27 NOTE — Assessment & Plan Note (Signed)
So far, she tolerated treatment well except for constipation and uncontrolled hypertension We discussed importance of management of hypertension and constipation We will proceed with treatment without delay I recommend minimum 3-4 cycles of therapy before repeating imaging study

## 2022-11-27 NOTE — Assessment & Plan Note (Signed)
She has severe uncontrolled hyperglycemia due to steroid treatment She will receive 1 dose of insulin today

## 2022-11-27 NOTE — Progress Notes (Signed)
[facility] OFFICE PROGRESS NOTE  Patient Care Team: Verlon Au, MD as PCP - General (Family Medicine) Runell Gess, MD as PCP - Cardiology (Cardiology) Artis Delay, MD as Consulting Physician (Hematology and Oncology)  ASSESSMENT & PLAN:  Endometrial cancer [facility]) So far, she tolerated treatment well except for constipation and uncontrolled hypertension We discussed importance of management of hypertension and constipation We will proceed with treatment without delay I recommend minimum 3-4 cycles of therapy before repeating imaging study  Essential hypertension She has poorly controlled hypertension and is not taking care of herself I recommend she takes full dose losartan and we will monitor her kidney function and potassium level carefully  CKD (chronic kidney disease), stage III ([name]) She has intermittent acute on chronic renal failure We discussed importance of hydration and risk factor modification She can proceed as long as creatinine is less than 2  Other constipation We discussed importance of taking all the regular laxatives while on treatment  Type 2 diabetes mellitus ([name]) She has severe uncontrolled hyperglycemia due to steroid treatment She will receive 1 dose of insulin today  Orders Placed This Encounter  Procedures   CBC with Differential (Cancer Center Only)    Standing Status:   Future    Standing Expiration Date:   07/16/2024   CMP (Cancer Center only)    Standing Status:   Future    Standing Expiration Date:   07/16/2024   T4    Standing Status:   Future    Standing Expiration Date:   07/16/2024   TSH    Standing Status:   Future    Standing Expiration Date:   07/16/2024   CBC with Differential (Cancer Center Only)    Standing Status:   Future    Standing Expiration Date:   08/27/2024   CMP (Cancer Center only)    Standing Status:   Future    Standing Expiration Date:   08/27/2024   T4    Standing Status:   Future     Standing Expiration Date:   08/27/2024   TSH    Standing Status:   Future    Standing Expiration Date:   08/27/2024   CBC with Differential (Cancer Center Only)    Standing Status:   Future    Standing Expiration Date:   10/08/2024   CMP (Cancer Center only)    Standing Status:   Future    Standing Expiration Date:   10/08/2024   T4    Standing Status:   Future    Standing Expiration Date:   10/08/2024   TSH    Standing Status:   Future    Standing Expiration Date:   10/08/2024    All questions were answered. The patient knows to call the clinic with any problems, questions or concerns. The total time spent in the appointment was 30 minutes encounter with patients including review of chart and various tests results, discussions about plan of care and coordination of care plan   Artis Delay, MD 11/27/2022 9:30 AM  INTERVAL HISTORY: Please see below for problem oriented charting. she returns for treatment follow-up seen prior to cycle 2 of treatment She had constipation for 3 days with recent treatment No nausea Denies peripheral neuropathy She measured her blood pressure at home and they were running high Denies hyperglycemia  REVIEW OF SYSTEMS:   Constitutional: Denies fevers, chills or abnormal weight loss Eyes: Denies blurriness of vision Ears, nose, mouth, throat, and face: Denies  mucositis or sore throat Respiratory: Denies cough, dyspnea or wheezes Cardiovascular: Denies palpitation, chest discomfort or lower extremity swelling Skin: Denies abnormal skin rashes Lymphatics: Denies new lymphadenopathy or easy bruising Neurological:Denies numbness, tingling or new weaknesses Behavioral/Psych: Mood is stable, no new changes  All other systems were reviewed with the patient and are negative.  I have reviewed the past medical history, past surgical history, social history and family history with the patient and they are unchanged from previous note.  ALLERGIES:  is allergic to  statins, agrostis alba pollen extract [gramineae pollens], and zetia [ezetimibe].  MEDICATIONS:  Current Outpatient Medications  Medication Sig Dispense Refill   acetaminophen (TYLENOL) 500 MG tablet Take 1,000 mg by mouth every 6 (six) hours as needed for mild pain or headache.     aspirin EC 81 MG tablet Take 1 tablet (81 mg total) by mouth daily. Swallow whole.     dexamethasone (DECADRON) 4 MG tablet Take 2 tabs at the night before chemotherapy, every 3 weeks, by mouth x 6 cycles 12 tablet 6   Evolocumab (REPATHA SURECLICK) 140 MG/ML SOAJ Inject 140 mg into the skin every 14 (fourteen) days. 2 pen 11   lidocaine-prilocaine (EMLA) cream Apply 1 application topically as needed. 30 g 0   loratadine (CLARITIN) 10 MG tablet Take 1 tablet (10 mg total) by mouth daily as needed for allergies. 30 tablet 11   losartan (COZAAR) 25 MG tablet Take 0.5 tablets (12.5 mg total) by mouth daily. 45 tablet 3   metFORMIN (GLUCOPHAGE) 1000 MG tablet Take 1,000 mg by mouth 2 (two) times daily with a meal.     metoprolol succinate (TOPROL XL) 25 MG 24 hr tablet Take 1 tablet (25 mg total) by mouth daily. 90 tablet 3   nitroGLYCERIN (NITROSTAT) 0.4 MG SL tablet Place 1 tablet (0.4 mg total) under the tongue every 5 (five) minutes as needed for chest pain. 30 tablet 2   ondansetron (ZOFRAN) 8 MG tablet Take 1 tablet (8 mg total) by mouth every 8 (eight) hours as needed for nausea. 30 tablet 3   polyethylene glycol (MIRALAX) 17 g packet Take 17 g by mouth daily. 30 each 1   prochlorperazine (COMPAZINE) 10 MG tablet Take 1 tablet (10 mg total) by mouth every 6 (six) hours as needed for nausea or vomiting. 30 tablet 0   senna-docusate (SENOKOT-S) 8.6-50 MG tablet Take 2 tablets by mouth 2 (two) times daily. 90 tablet 1   spironolactone (ALDACTONE) 25 MG tablet Take 1 tablet (25 mg total) by mouth daily. 45 tablet 3   No current facility-administered medications for this visit.   Facility-Administered Medications  Ordered in Other Visits  Medication Dose Route Frequency Provider Last Rate Last Admin   CARBOplatin (PARAPLATIN) 340 mg in sodium chloride 0.9 % 100 mL chemo infusion  340 mg Intravenous Once Bertis Ruddy, Aijalon Demuro, MD       cetirizine (QUZYTTIR) injection 10 mg  10 mg Intravenous Once Bertis Ruddy, Thena Devora, MD       dexamethasone (DECADRON) 10 mg in sodium chloride 0.9 % 50 mL IVPB  10 mg Intravenous Once Bertis Ruddy, Ryshawn Sanzone, MD 204 mL/hr at 11/27/22 0929 10 mg at 11/27/22 0929   dostarlimab-gxly (JEMPERLI) 500 mg in sodium chloride 0.9 % 100 mL (4.5455 mg/mL) chemo infusion  500 mg Intravenous Once Bertis Ruddy, Jaretzi Droz, MD       famotidine (PEPCID) IVPB 20 mg premix  20 mg Intravenous Once Bertis Ruddy, Darrelle Wiberg, MD       fosaprepitant (EMEND) 150  mg in sodium chloride 0.9 % 145 mL IVPB  150 mg Intravenous Once Bertis Ruddy, Maressa Apollo, MD       PACLitaxel (TAXOL) 228 mg in sodium chloride 0.9 % 250 mL chemo infusion (> 80mg /m2)  131.25 mg/m2 (Treatment Plan Recorded) Intravenous Once Bertis Ruddy, Kimetha Trulson, MD       palonosetron (ALOXI) injection 0.25 mg  0.25 mg Intravenous Once Artis Delay, MD        SUMMARY OF ONCOLOGIC HISTORY: Oncology History Overview Note  High grade serous MSI stable, Her2/neu 3+, 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    12/14/2020 Imaging   CT chest abdomen and pelvis IMPRESSION:  1.  2 small nodules in the right lung, nonspecific.  2.  Prominent precarinal lymph node versus adjacent small nodes.  3.  Enlarged heterogeneous uterine fundus.  4.  No definite adenopathy in the abdomen or pelvis. Nonspecific small lymph nodes are present, not enlarged by CT criteria.  5.  Mild wall thickening of the anterior aspect of the urinary bladder which may be due to to nondistention or inflammation.    12/27/2020 Pathology Results   SPECIMEN     Procedure:    Total hysterectomy and bilateral salpingo-oophorectomy    TUMOR     Histologic Type:    Serous carcinoma     Histologic Grade:    Not applicable     Myometrial Invasion:    Present       Depth of Myometrial Invasion:    11 mm       Myometrial Thickness:    12 mm       Percentage of Myometrial Invasion:    92 %     Uterine Serosa Involvement:    Not identified     Cervical Stromal Involvement:    Not identified     Other Tissue / Organ Involvement:    Not identified     Peritoneal / Ascitic Fluid:    Not identified     Lymphovascular Invasion (LVI):    Present   MARGINS     Margin Status:    Not applicable   REGIONAL LYMPH NODES     Regional Lymph Node Status:           :    All regional lymph nodes negative for tumor cells         Lymph Nodes Examined:               Total Number of Pelvic Nodes Examined:    3           Number of Pelvic Sentinel Nodes Examined:    3           Total Number of Para-aortic Nodes Examined:    0           Number of Para-aortic Sentinel Nodes Examined:    Not applicable   DISTANT METASTASIS     Distant Site(s) Involved:    Not applicable   PATHOLOGIC STAGE CLASSIFICATION (pTNM, AJCC 8th Edition)     Reporting of pT, pN, and (when applicable) pM categories is based on information available to the pathologist at the time the report is issued. As per the AJCC (Chapter 1, 8th Ed.) it is the managing physician's responsibility to establish the final pathologic stage based upon all pertinent information, including but potentially not limited to this  pathology report.     TNM Descriptors:    Not applicable     Tumor Modifier:    Not applicable     pT Category:    pT1b     Regional Lymph Nodes Modifier:    (sn)     pN Category:    pN0   The endometrial adenocarcinoma was analyzed by IHC for DNA mismatch repair proteins.  The neoplasm retained nuclear expression of all four mismatch repair proteins, MLH1, PMS2, MSH2, MSH6.    Estrogen receptors (ER): Negative Progesterone receptors (PR): Negative  Controls  worked appropriately.   A HER2 stain is POSITIVE (3+).   12/27/2020 Surgery   At [facility], she underwent a robotic hysterectomy/BSO, sentinel node dissection and partial omentectomy. She was found to have an enlarged uterus. An enlarged right pelvic sentinel node.    02/23/2021 Echocardiogram   Left Ventricle: Systolic function is normal. EF: 55-60%.    Left Ventricle: Doppler parameters consistent with mild diastolic  dysfunction and low to normal LA pressure.    Left Ventricle: There is mild concentric hypertrophy.    Aortic Valve: Mild aortic valve regurgitation.   01/27/2022 Imaging   CT chest imaging 1. No pulmonary embolus. 2. Findings consistent with intrathoracic malignancy. Multiple pulmonary nodules throughout both lungs of varying sizes, consistent with metastatic disease. Primary site of malignancy may represent a 4 cm spiculated nodule in the right perihilar region spanning the fissure that is cavitating. Alternatively all of these nodules may be metastatic given history of cancer, type not specified.  3. Multifocal mediastinal and bilateral hilar adenopathy, consistent metastatic disease. 4. Nondisplaced left anterior seventh and eighth rib fractures, soft tissue thickening adjacent to the seventh rib fracture suggests this may be pathologic 5. Right hydronephrosis is partially included in the field of view in the upper abdomen. Small soft tissue density medial to the right hepatic lobe posterior to the right kidney is nonspecific but may represent metastatic disease. Recommend staging CT of the abdomen and pelvis with oral and IV contrast. 6. Moderate emphysema. 7. Aortic atherosclerosis.  Coronary artery calcifications.   01/28/2022 Initial Diagnosis   Endometrial cancer ([name])   01/28/2022 Cancer Staging   Staging form: Corpus Uteri - Carcinoma and Carcinosarcoma, AJCC 8th Edition - Clinical stage from 01/28/2022: FIGO Stage IVB (rcT1b, cN2a, cM1) - Signed by Artis Delay, MD on  01/28/2022 Stage prefix: Recurrence   01/28/2022 Imaging   CT abdomen and pelvis 1. Multiple abdominal and pelvic metastatic implants as above. 2. Moderate right hydronephrosis secondary to mass effect and obstruction of the distal right ureter by a right pelvic metastatic implant. 3. Multiple pulmonary metastatic disease in the visualized right lung base. 4. No bowel obstruction. Normal appendix. 5. Aortic Atherosclerosis (ICD10-I70.0).   01/30/2022 Echocardiogram    1. Left ventricular ejection fraction, by estimation, is 55 to 60%. The left ventricle has normal function. The left ventricle has no regional wall motion abnormalities. There is moderate concentric left ventricular  hypertrophy. Left ventricular diastolic parameters are consistent with Grade I diastolic dysfunction (impaired relaxation).   2. Right ventricular systolic function is normal. The right ventricular size is normal. There is normal pulmonary artery systolic pressure.   3. Left atrial size was mildly dilated.   4. The mitral valve is normal in structure. Trivial mitral valve regurgitation. No evidence of mitral stenosis.   5. The aortic valve is tricuspid. Aortic valve regurgitation is trivial. No aortic stenosis is present.   6. Aortic dilatation noted. There  is mild dilatation of the aortic root, measuring 39 mm.   7. The inferior vena cava is normal in size with <50% respiratory variability, suggesting right atrial pressure of 8 mmHg.    01/31/2022 - 04/04/2022 Chemotherapy   Patient is on Treatment Plan : UTERINE SEROUS CARCINOMA Carboplatin + Paclitaxel + Trastuzumab q21d x 6 Cycles / Trastuzumab q21d     01/31/2022 - 05/18/2022 Chemotherapy   Patient is on Treatment Plan : UTERINE SEROUS CARCINOMA Carboplatin + Paclitaxel + Trastuzumab q21d x 6 Cycles / Trastuzumab q21d     04/03/2022 Imaging   1. Pulmonary nodules are either reduced in size or completely resolved. No new nodules. 2. Marked reduction in size  peritoneal nodular metastasis. Multiple nodular lesions are no longer measurable. 3. Interval reduction in size of implant in the RIGHT operator space. While this implant is smaller, lesion continues to obstruct the RIGHT ureter with persistent RIGHT hydronephrosis and hydroureter. 4. No new metastatic lesions are present.   05/09/2022 Echocardiogram    1. Left ventricular ejection fraction, by estimation, is 45 to 50%. The left ventricle has mildly decreased function. The left ventricle demonstrates global hypokinesis. There is moderate concentric left ventricular hypertrophy. Left ventricular diastolic parameters are consistent with Grade I diastolic dysfunction (impaired relaxation). The average left ventricular global longitudinal strain is -10.8 %. The global longitudinal strain is abnormal.  2. Right ventricular systolic function is normal. The right ventricular size is normal.  3. The mitral valve is normal in structure. No evidence of mitral valve regurgitation.  4. The aortic valve is tricuspid. Aortic valve regurgitation is mild.  5. There is mild dilatation of the ascending aorta, measuring 39 mm.  6. The inferior vena cava is normal in size with <50% respiratory variability, suggesting right atrial pressure of 8 mmHg.   07/24/2022 Imaging   1. Improving appearance of sequela of metastatic disease. RIGHT pelvic sidewall mass obstructing the RIGHT ureter is no longer clearly seen. LEFT retroperitoneal soft tissue has diminished. 2. No signs of pulmonary metastatic disease or metastatic disease to the chest. 3. Mild RIGHT hydroureter without hydronephrosis. Resolution of hydronephrosis but with further atrophy of the RIGHT kidney. 4. Aortic atherosclerosis and 3 vessel coronary artery disease.   Aortic Atherosclerosis (ICD10-I70.0).   10/25/2022 Imaging   1. New pathologically enlarged mediastinal and left hilar lymph nodes as well as redemonstration previously treated/resolved pulmonary  nodules, consistent with thoracic metastatic disease. Consider further evaluation by nuclear medicine PET-CT. 2. New subtle 14 mm hypodense lesion in the hepatic dome with increased conspicuity of a previously treated hepatic metastatic lesion in the right lobe of the liver, suspicious for new/recurrent hepatic metastatic disease. 3. Interval increase in the soft tissue thickening adjacent to loops of bowel in the right lower quadrant near the right external iliac artery, suspicious for recurrent disease. 4. Slight interval decrease in size of the left retroperitoneal soft tissue nodule. 5. Small focus of soft tissue nodularity along the left vaginal cuff is similar prior. 6.  Aortic Atherosclerosis (ICD10-I70.0).   11/06/2022 -  Chemotherapy   Patient is on Treatment Plan : UTERINE ENDOMETRIAL Dostarlimab-gxly (500 mg) + Carboplatin (AUC 5) + Paclitaxel (175 mg/m2) q21d x 6 cycles / Dostarlimab-gxly (1000 mg) q42d x 6 cycles      Metastasis to lung  01/28/2022 Initial Diagnosis   Metastasis to lung ([name])   11/06/2022 -  Chemotherapy   Patient is on Treatment Plan : UTERINE ENDOMETRIAL Dostarlimab-gxly (500 mg) + Carboplatin (AUC 5) +  Paclitaxel (175 mg/m2) q21d x 6 cycles / Dostarlimab-gxly (1000 mg) q42d x 6 cycles        PHYSICAL EXAMINATION: ECOG PERFORMANCE STATUS: 1 - Symptomatic but completely ambulatory  Vitals:   11/27/22 0827  BP: (!) 171/62  Pulse: 62  Resp: 18  Temp: (!) 97.4 F (36.3 C)  SpO2: 98%   Filed Weights   11/27/22 0827  Weight: 146 lb (66.2 kg)    GENERAL:alert, no distress and comfortable  NEURO: alert & oriented x 3 with fluent speech, no focal motor/sensory deficits  LABORATORY DATA:  I have reviewed the data as listed    Component Value Date/Time   NA 137 11/27/2022 0811   NA 141 02/25/2019 1222   K 4.2 11/27/2022 0811   CL 105 11/27/2022 0811   CO2 26 11/27/2022 0811   GLUCOSE 296 (H) 11/27/2022 0811   BUN 26 (H) 11/27/2022 0811   BUN 17  02/25/2019 1222   CREATININE 1.45 (H) 11/27/2022 0811   CALCIUM 9.5 11/27/2022 0811   PROT 8.0 11/27/2022 0811   PROT 6.9 09/22/2019 1419   ALBUMIN 3.9 11/27/2022 0811   ALBUMIN 3.9 09/22/2019 1419   AST 15 11/27/2022 0811   ALT 13 11/27/2022 0811   ALKPHOS 58 11/27/2022 0811   BILITOT 0.4 11/27/2022 0811   GFRNONAA 42 (L) 11/27/2022 0811   GFRAA >60 05/16/2019 0335    No results found for: "SPEP", "UPEP"  Lab Results  Component Value Date   WBC 4.8 11/27/2022   NEUTROABS 3.7 11/27/2022   HGB 12.8 11/27/2022   HCT 38.4 11/27/2022   MCV 84.8 11/27/2022   PLT 269 11/27/2022      Chemistry      Component Value Date/Time   NA 137 11/27/2022 0811   NA 141 02/25/2019 1222   K 4.2 11/27/2022 0811   CL 105 11/27/2022 0811   CO2 26 11/27/2022 0811   BUN 26 (H) 11/27/2022 0811   BUN 17 02/25/2019 1222   CREATININE 1.45 (H) 11/27/2022 0811      Component Value Date/Time   CALCIUM 9.5 11/27/2022 0811   ALKPHOS 58 11/27/2022 0811   AST 15 11/27/2022 0811   ALT 13 11/27/2022 0811   BILITOT 0.4 11/27/2022 2952

## 2022-12-18 MED FILL — Fosaprepitant Dimeglumine For IV Infusion 150 MG (Base Eq): INTRAVENOUS | Qty: 5 | Status: AC

## 2022-12-18 MED FILL — Dexamethasone Sodium Phosphate Inj 100 MG/10ML: INTRAMUSCULAR | Qty: 1 | Status: AC

## 2022-12-19 ENCOUNTER — Inpatient Hospital Stay: Payer: Medicare HMO

## 2022-12-19 ENCOUNTER — Inpatient Hospital Stay: Payer: Medicare HMO | Admitting: Hematology and Oncology

## 2022-12-19 ENCOUNTER — Telehealth: Payer: Self-pay

## 2022-12-19 NOTE — Telephone Encounter (Signed)
Called her regarding not showing up for appts today. She is not feeling well and having a hard time talking due to her voice hoarseness. She said she called to cancel appts and left a message with scheduling. She will call the office back for concerns or if she needs appt.

## 2022-12-21 ENCOUNTER — Telehealth: Payer: Self-pay | Admitting: Hematology and Oncology

## 2022-12-21 ENCOUNTER — Telehealth: Payer: Self-pay | Admitting: Oncology

## 2022-12-21 ENCOUNTER — Other Ambulatory Visit: Payer: Self-pay | Admitting: Hematology and Oncology

## 2022-12-21 NOTE — Telephone Encounter (Signed)
Spoke with patient confirming appointment change  

## 2022-12-21 NOTE — Telephone Encounter (Signed)
Called [place] and she is feeling much better.  She would like to reschedule her appointments that she missed on 5/7.  Advised her that I will let the scheduler know and they will call her to reschedule.

## 2022-12-27 MED FILL — Fosaprepitant Dimeglumine For IV Infusion 150 MG (Base Eq): INTRAVENOUS | Qty: 5 | Status: AC

## 2022-12-27 MED FILL — Dexamethasone Sodium Phosphate Inj 100 MG/10ML: INTRAMUSCULAR | Qty: 1 | Status: AC

## 2022-12-28 ENCOUNTER — Inpatient Hospital Stay: Payer: Medicare HMO

## 2022-12-28 ENCOUNTER — Inpatient Hospital Stay: Payer: Medicare HMO | Attending: Hematology and Oncology

## 2022-12-28 ENCOUNTER — Inpatient Hospital Stay (HOSPITAL_BASED_OUTPATIENT_CLINIC_OR_DEPARTMENT_OTHER): Payer: Medicare HMO | Admitting: Hematology and Oncology

## 2022-12-28 ENCOUNTER — Encounter: Payer: Self-pay | Admitting: Hematology and Oncology

## 2022-12-28 VITALS — BP 181/100 | HR 65 | Temp 97.5°F | Resp 18 | Ht 63.0 in | Wt 146.2 lb

## 2022-12-28 VITALS — BP 190/105

## 2022-12-28 DIAGNOSIS — C7801 Secondary malignant neoplasm of right lung: Secondary | ICD-10-CM

## 2022-12-28 DIAGNOSIS — C7802 Secondary malignant neoplasm of left lung: Secondary | ICD-10-CM

## 2022-12-28 DIAGNOSIS — Z5112 Encounter for antineoplastic immunotherapy: Secondary | ICD-10-CM | POA: Insufficient documentation

## 2022-12-28 DIAGNOSIS — N183 Chronic kidney disease, stage 3 unspecified: Secondary | ICD-10-CM

## 2022-12-28 DIAGNOSIS — C541 Malignant neoplasm of endometrium: Secondary | ICD-10-CM | POA: Diagnosis not present

## 2022-12-28 DIAGNOSIS — I10 Essential (primary) hypertension: Secondary | ICD-10-CM

## 2022-12-28 DIAGNOSIS — Z5111 Encounter for antineoplastic chemotherapy: Secondary | ICD-10-CM | POA: Diagnosis present

## 2022-12-28 DIAGNOSIS — Z79620 Long term (current) use of immunosuppressive biologic: Secondary | ICD-10-CM | POA: Insufficient documentation

## 2022-12-28 DIAGNOSIS — D6481 Anemia due to antineoplastic chemotherapy: Secondary | ICD-10-CM

## 2022-12-28 DIAGNOSIS — T451X5A Adverse effect of antineoplastic and immunosuppressive drugs, initial encounter: Secondary | ICD-10-CM

## 2022-12-28 LAB — CBC WITH DIFFERENTIAL (CANCER CENTER ONLY)
Abs Immature Granulocytes: 0.02 10*3/uL (ref 0.00–0.07)
Basophils Absolute: 0 10*3/uL (ref 0.0–0.1)
Basophils Relative: 0 %
Eosinophils Absolute: 0 10*3/uL (ref 0.0–0.5)
Eosinophils Relative: 0 %
HCT: 34.8 % — ABNORMAL LOW (ref 36.0–46.0)
Hemoglobin: 11.4 g/dL — ABNORMAL LOW (ref 12.0–15.0)
Immature Granulocytes: 0 %
Lymphocytes Relative: 21 %
Lymphs Abs: 1.2 10*3/uL (ref 0.7–4.0)
MCH: 28.1 pg (ref 26.0–34.0)
MCHC: 32.8 g/dL (ref 30.0–36.0)
MCV: 85.7 fL (ref 80.0–100.0)
Monocytes Absolute: 0.4 10*3/uL (ref 0.1–1.0)
Monocytes Relative: 7 %
Neutro Abs: 4.2 10*3/uL (ref 1.7–7.7)
Neutrophils Relative %: 72 %
Platelet Count: 279 10*3/uL (ref 150–400)
RBC: 4.06 MIL/uL (ref 3.87–5.11)
RDW: 16.9 % — ABNORMAL HIGH (ref 11.5–15.5)
WBC Count: 5.8 10*3/uL (ref 4.0–10.5)
nRBC: 0 % (ref 0.0–0.2)

## 2022-12-28 LAB — CMP (CANCER CENTER ONLY)
ALT: 12 U/L (ref 0–44)
AST: 15 U/L (ref 15–41)
Albumin: 3.9 g/dL (ref 3.5–5.0)
Alkaline Phosphatase: 56 U/L (ref 38–126)
Anion gap: 8 (ref 5–15)
BUN: 24 mg/dL — ABNORMAL HIGH (ref 6–20)
CO2: 26 mmol/L (ref 22–32)
Calcium: 9.1 mg/dL (ref 8.9–10.3)
Chloride: 102 mmol/L (ref 98–111)
Creatinine: 1.25 mg/dL — ABNORMAL HIGH (ref 0.44–1.00)
GFR, Estimated: 50 mL/min — ABNORMAL LOW (ref >60)
Glucose, Bld: 311 mg/dL — ABNORMAL HIGH (ref 70–99)
Potassium: 4.2 mmol/L (ref 3.5–5.1)
Sodium: 136 mmol/L (ref 135–145)
Total Bilirubin: 0.4 mg/dL (ref 0.3–1.2)
Total Protein: 7.7 g/dL (ref 6.5–8.1)

## 2022-12-28 LAB — TSH: TSH: 0.276 u[IU]/mL — ABNORMAL LOW (ref 0.350–4.500)

## 2022-12-28 MED ORDER — SODIUM CHLORIDE 0.9 % IV SOLN: Freq: Once | INTRAVENOUS | Status: AC

## 2022-12-28 MED ORDER — SODIUM CHLORIDE 0.9 % IV SOLN
500.0000 mg | Freq: Once | INTRAVENOUS | Status: AC
  Administered 2022-12-28: 500 mg via INTRAVENOUS
  Filled 2022-12-28: qty 10

## 2022-12-28 MED ORDER — SODIUM CHLORIDE 0.9% FLUSH
10.0000 mL | INTRAVENOUS | Status: DC | PRN
  Administered 2022-12-28: 10 mL

## 2022-12-28 MED ORDER — PALONOSETRON HCL INJECTION 0.25 MG/5ML
0.2500 mg | Freq: Once | INTRAVENOUS | Status: AC
  Administered 2022-12-28: 0.25 mg via INTRAVENOUS
  Filled 2022-12-28: qty 5

## 2022-12-28 MED ORDER — FAMOTIDINE 20 MG IN NS 100 ML IVPB
20.0000 mg | Freq: Once | INTRAVENOUS | Status: AC
  Administered 2022-12-28: 20 mg via INTRAVENOUS
  Filled 2022-12-28: qty 100

## 2022-12-28 MED ORDER — CETIRIZINE HCL 10 MG/ML IV SOLN
10.0000 mg | Freq: Once | INTRAVENOUS | Status: AC
  Administered 2022-12-28: 10 mg via INTRAVENOUS
  Filled 2022-12-28: qty 1

## 2022-12-28 MED ORDER — SODIUM CHLORIDE 0.9 % IV SOLN
10.0000 mg | Freq: Once | INTRAVENOUS | Status: AC
  Administered 2022-12-28: 10 mg via INTRAVENOUS
  Filled 2022-12-28: qty 10
  Filled 2022-12-28: qty 1

## 2022-12-28 MED ORDER — HEPARIN SOD (PORK) LOCK FLUSH 100 UNIT/ML IV SOLN
500.0000 [IU] | Freq: Once | INTRAVENOUS | Status: AC | PRN
  Administered 2022-12-28: 500 [IU]

## 2022-12-28 MED ORDER — LOSARTAN POTASSIUM 25 MG PO TABS: 25.0000 mg | ORAL_TABLET | Freq: Two times a day (BID) | ORAL | 3 refills | Status: DC

## 2022-12-28 MED ORDER — SODIUM CHLORIDE 0.9 % IV SOLN
378.0000 mg | Freq: Once | INTRAVENOUS | Status: AC
  Administered 2022-12-28: 380 mg via INTRAVENOUS
  Filled 2022-12-28: qty 38

## 2022-12-28 MED ORDER — SODIUM CHLORIDE 0.9 % IV SOLN
150.0000 mg | Freq: Once | INTRAVENOUS | Status: AC
  Administered 2022-12-28: 150 mg via INTRAVENOUS
  Filled 2022-12-28: qty 5
  Filled 2022-12-28: qty 150

## 2022-12-28 MED ORDER — SODIUM CHLORIDE 0.9 % IV SOLN
131.2500 mg/m2 | Freq: Once | INTRAVENOUS | Status: AC
  Administered 2022-12-28: 228 mg via INTRAVENOUS
  Filled 2022-12-28: qty 38

## 2022-12-28 MED ORDER — DEXAMETHASONE 4 MG PO TABS: ORAL_TABLET | ORAL | 6 refills | Status: DC

## 2022-12-28 MED ORDER — SODIUM CHLORIDE 0.9% FLUSH
10.0000 mL | Freq: Once | INTRAVENOUS | Status: AC
  Administered 2022-12-28: 10 mL

## 2022-12-28 NOTE — Progress Notes (Signed)
[facility] OFFICE PROGRESS NOTE  Patient Care Team: Verlon Au, MD as PCP - General (Family Medicine) Runell Gess, MD as PCP - Cardiology (Cardiology) Artis Delay, MD as Consulting Physician (Hematology and Oncology)  ASSESSMENT & PLAN:  Endometrial cancer [facility]) So far, she tolerated treatment well except for constipation and uncontrolled hypertension We discussed importance of management of hypertension and constipation We will proceed with treatment without delay I recommend repeat imaging study next month  CKD (chronic kidney disease), stage III ([name]) She has intermittent acute on chronic renal failure We discussed importance of hydration and risk factor modification She can proceed as long as creatinine is less than 2  Essential hypertension She has poorly controlled hypertension and is not taking care of herself I recommend she takes full dose losartan and we will monitor her kidney function and potassium level carefully Refilled her prescription today  Anemia due to antineoplastic chemotherapy This is likely due to recent treatment. The patient denies recent history of bleeding such as epistaxis, hematuria or hematochezia. She is asymptomatic from the anemia. I will observe for now.  She does not require transfusion now. I will continue the chemotherapy at current dose without dosage adjustment.  If the anemia gets progressive worse in the future, I might have to delay her treatment or adjust the chemotherapy dose.   Orders Placed This Encounter  Procedures   CT CHEST ABDOMEN PELVIS W CONTRAST    NO PO contrast    Standing Status:   Future    Standing Expiration Date:   12/28/2023    Order Specific Question:   If indicated for the ordered procedure, I authorize the administration of contrast media per Radiology protocol    Answer:   Yes    Order Specific Question:   Does the patient have a contrast media/X-ray dye allergy?    Answer:   No     Order Specific Question:   Is patient pregnant?    Answer:   No    Order Specific Question:   Preferred imaging location?    Answer:   [facility]    Order Specific Question:   If indicated for the ordered procedure, I authorize the administration of oral contrast media per Radiology protocol    Answer:   Yes    All questions were answered. The patient knows to call the clinic with any problems, questions or concerns. The total time spent in the appointment was 30 minutes encounter with patients including review of chart and various tests results, discussions about plan of care and coordination of care plan   Artis Delay, MD 12/28/2022 2:08 PM  INTERVAL HISTORY: Please see below for problem oriented charting. she returns for treatment follow-up She missed her appointment recently She stated she is compliant taking her medications as directed Her blood pressure is very high She has not been back to her primary care doctor for management She denies headache or changes in vision No worsening peripheral neuropathy  REVIEW OF SYSTEMS:   Constitutional: Denies fevers, chills or abnormal weight loss Eyes: Denies blurriness of vision Ears, nose, mouth, throat, and face: Denies mucositis or sore throat Respiratory: Denies cough, dyspnea or wheezes Cardiovascular: Denies palpitation, chest discomfort or lower extremity swelling Gastrointestinal:  Denies nausea, heartburn or change in bowel habits Skin: Denies abnormal skin rashes Lymphatics: Denies new lymphadenopathy or easy bruising Neurological:Denies numbness, tingling or new weaknesses Behavioral/Psych: Mood is stable, no new changes  All other systems were  reviewed with the patient and are negative.  I have reviewed the past medical history, past surgical history, social history and family history with the patient and they are unchanged from previous note.  ALLERGIES:  is allergic to statins, agrostis alba pollen extract  [gramineae pollens], and zetia [ezetimibe].  MEDICATIONS:  Current Outpatient Medications  Medication Sig Dispense Refill   acetaminophen (TYLENOL) 500 MG tablet Take 1,000 mg by mouth every 6 (six) hours as needed for mild pain or headache.     aspirin EC 81 MG tablet Take 1 tablet (81 mg total) by mouth daily. Swallow whole.     dexamethasone (DECADRON) 4 MG tablet Take 2 tabs at the night before chemotherapy, every 3 weeks, by mouth x 6 cycles 12 tablet 6   Evolocumab (REPATHA SURECLICK) 140 MG/ML SOAJ Inject 140 mg into the skin every 14 (fourteen) days. 2 pen 11   lidocaine-prilocaine (EMLA) cream Apply 1 application topically as needed. 30 g 0   loratadine (CLARITIN) 10 MG tablet Take 1 tablet (10 mg total) by mouth daily as needed for allergies. 30 tablet 11   losartan (COZAAR) 25 MG tablet Take 1 tablet (25 mg total) by mouth 2 (two) times daily. 60 tablet 3   metFORMIN (GLUCOPHAGE) 1000 MG tablet Take 1,000 mg by mouth 2 (two) times daily with a meal.     metoprolol succinate (TOPROL XL) 25 MG 24 hr tablet Take 1 tablet (25 mg total) by mouth daily. 90 tablet 3   nitroGLYCERIN (NITROSTAT) 0.4 MG SL tablet Place 1 tablet (0.4 mg total) under the tongue every 5 (five) minutes as needed for chest pain. 30 tablet 2   ondansetron (ZOFRAN) 8 MG tablet Take 1 tablet (8 mg total) by mouth every 8 (eight) hours as needed for nausea. 30 tablet 3   polyethylene glycol (MIRALAX) 17 g packet Take 17 g by mouth daily. 30 each 1   prochlorperazine (COMPAZINE) 10 MG tablet Take 1 tablet (10 mg total) by mouth every 6 (six) hours as needed for nausea or vomiting. 30 tablet 0   senna-docusate (SENOKOT-S) 8.6-50 MG tablet Take 2 tablets by mouth 2 (two) times daily. 90 tablet 1   spironolactone (ALDACTONE) 25 MG tablet Take 1 tablet (25 mg total) by mouth daily. 45 tablet 3   No current facility-administered medications for this visit.   Facility-Administered Medications Ordered in Other Visits   Medication Dose Route Frequency Provider Last Rate Last Admin   CARBOplatin (PARAPLATIN) 380 mg in sodium chloride 0.9 % 100 mL chemo infusion  380 mg Intravenous Once Bertis Ruddy, Nero Sawatzky, MD       heparin lock flush 100 unit/mL  500 Units Intracatheter Once PRN Bertis Ruddy, Orvilla Truett, MD       PACLitaxel (TAXOL) 228 mg in sodium chloride 0.9 % 250 mL chemo infusion (> 80mg /m2)  131.25 mg/m2 (Treatment Plan Recorded) Intravenous Once Bertis Ruddy, Jozette Castrellon, MD 96 mL/hr at 12/28/22 1304 228 mg at 12/28/22 1304   sodium chloride flush (NS) 0.9 % injection 10 mL  10 mL Intracatheter PRN Artis Delay, MD        SUMMARY OF ONCOLOGIC HISTORY: Oncology History Overview Note  High grade serous MSI stable, Her2/neu 3+, 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    12/14/2020 Imaging   CT chest abdomen and pelvis IMPRESSION:  1.  2 small nodules in the  right lung, nonspecific.  2.  Prominent precarinal lymph node versus adjacent small nodes.  3.  Enlarged heterogeneous uterine fundus.  4.  No definite adenopathy in the abdomen or pelvis. Nonspecific small lymph nodes are present, not enlarged by CT criteria.  5.  Mild wall thickening of the anterior aspect of the urinary bladder which may be due to to nondistention or inflammation.    12/27/2020 Pathology Results   SPECIMEN     Procedure:    Total hysterectomy and bilateral salpingo-oophorectomy   TUMOR     Histologic Type:    Serous carcinoma     Histologic Grade:    Not applicable     Myometrial Invasion:    Present       Depth of Myometrial Invasion:    11 mm       Myometrial Thickness:    12 mm       Percentage of Myometrial Invasion:    92 %     Uterine Serosa Involvement:    Not identified     Cervical Stromal Involvement:    Not identified     Other Tissue / Organ Involvement:    Not identified     Peritoneal / Ascitic Fluid:    Not  identified     Lymphovascular Invasion (LVI):    Present   MARGINS     Margin Status:    Not applicable   REGIONAL LYMPH NODES     Regional Lymph Node Status:           :    All regional lymph nodes negative for tumor cells         Lymph Nodes Examined:               Total Number of Pelvic Nodes Examined:    3           Number of Pelvic Sentinel Nodes Examined:    3           Total Number of Para-aortic Nodes Examined:    0           Number of Para-aortic Sentinel Nodes Examined:    Not applicable   DISTANT METASTASIS     Distant Site(s) Involved:    Not applicable   PATHOLOGIC STAGE CLASSIFICATION (pTNM, AJCC 8th Edition)     Reporting of pT, pN, and (when applicable) pM categories is based on information available to the pathologist at the time the report is issued. As per the AJCC (Chapter 1, 8th Ed.) it is the managing physician's responsibility to establish the final pathologic stage based upon all pertinent information, including but potentially not limited to this pathology report.     TNM Descriptors:    Not applicable     Tumor Modifier:    Not applicable     pT Category:    pT1b     Regional Lymph Nodes Modifier:    (sn)     pN Category:    pN0   The endometrial adenocarcinoma was analyzed by IHC for DNA mismatch repair proteins.  The neoplasm retained nuclear expression of all four mismatch repair proteins, MLH1, PMS2, MSH2, MSH6.    Estrogen receptors (ER): Negative Progesterone receptors (PR): Negative  Controls worked appropriately.   A HER2 stain is POSITIVE (3+).   12/27/2020 Surgery   At [facility], she underwent a robotic hysterectomy/BSO, sentinel node dissection and partial omentectomy. She was found to have an enlarged uterus. An enlarged right pelvic sentinel  node.    02/23/2021 Echocardiogram   Left Ventricle: Systolic function is normal. EF: 55-60%.    Left Ventricle: Doppler parameters consistent with mild diastolic  dysfunction and low to normal LA  pressure.    Left Ventricle: There is mild concentric hypertrophy.    Aortic Valve: Mild aortic valve regurgitation.   01/27/2022 Imaging   CT chest imaging 1. No pulmonary embolus. 2. Findings consistent with intrathoracic malignancy. Multiple pulmonary nodules throughout both lungs of varying sizes, consistent with metastatic disease. Primary site of malignancy may represent a 4 cm spiculated nodule in the right perihilar region spanning the fissure that is cavitating. Alternatively all of these nodules may be metastatic given history of cancer, type not specified.  3. Multifocal mediastinal and bilateral hilar adenopathy, consistent metastatic disease. 4. Nondisplaced left anterior seventh and eighth rib fractures, soft tissue thickening adjacent to the seventh rib fracture suggests this may be pathologic 5. Right hydronephrosis is partially included in the field of view in the upper abdomen. Small soft tissue density medial to the right hepatic lobe posterior to the right kidney is nonspecific but may represent metastatic disease. Recommend staging CT of the abdomen and pelvis with oral and IV contrast. 6. Moderate emphysema. 7. Aortic atherosclerosis.  Coronary artery calcifications.   01/28/2022 Initial Diagnosis   Endometrial cancer ([name])   01/28/2022 Cancer Staging   Staging form: Corpus Uteri - Carcinoma and Carcinosarcoma, AJCC 8th Edition - Clinical stage from 01/28/2022: FIGO Stage IVB (rcT1b, cN2a, cM1) - Signed by Artis Delay, MD on 01/28/2022 Stage prefix: Recurrence   01/28/2022 Imaging   CT abdomen and pelvis 1. Multiple abdominal and pelvic metastatic implants as above. 2. Moderate right hydronephrosis secondary to mass effect and obstruction of the distal right ureter by a right pelvic metastatic implant. 3. Multiple pulmonary metastatic disease in the visualized right lung base. 4. No bowel obstruction. Normal appendix. 5. Aortic Atherosclerosis (ICD10-I70.0).   01/30/2022  Echocardiogram    1. Left ventricular ejection fraction, by estimation, is 55 to 60%. The left ventricle has normal function. The left ventricle has no regional wall motion abnormalities. There is moderate concentric left ventricular  hypertrophy. Left ventricular diastolic parameters are consistent with Grade I diastolic dysfunction (impaired relaxation).   2. Right ventricular systolic function is normal. The right ventricular size is normal. There is normal pulmonary artery systolic pressure.   3. Left atrial size was mildly dilated.   4. The mitral valve is normal in structure. Trivial mitral valve regurgitation. No evidence of mitral stenosis.   5. The aortic valve is tricuspid. Aortic valve regurgitation is trivial. No aortic stenosis is present.   6. Aortic dilatation noted. There is mild dilatation of the aortic root, measuring 39 mm.   7. The inferior vena cava is normal in size with <50% respiratory variability, suggesting right atrial pressure of 8 mmHg.    01/31/2022 - 04/04/2022 Chemotherapy   Patient is on Treatment Plan : UTERINE SEROUS CARCINOMA Carboplatin + Paclitaxel + Trastuzumab q21d x 6 Cycles / Trastuzumab q21d     01/31/2022 - 05/18/2022 Chemotherapy   Patient is on Treatment Plan : UTERINE SEROUS CARCINOMA Carboplatin + Paclitaxel + Trastuzumab q21d x 6 Cycles / Trastuzumab q21d     04/03/2022 Imaging   1. Pulmonary nodules are either reduced in size or completely resolved. No new nodules. 2. Marked reduction in size peritoneal nodular metastasis. Multiple nodular lesions are no longer measurable. 3. Interval reduction in size of implant in the RIGHT operator  space. While this implant is smaller, lesion continues to obstruct the RIGHT ureter with persistent RIGHT hydronephrosis and hydroureter. 4. No new metastatic lesions are present.   05/09/2022 Echocardiogram    1. Left ventricular ejection fraction, by estimation, is 45 to 50%. The left ventricle has mildly decreased  function. The left ventricle demonstrates global hypokinesis. There is moderate concentric left ventricular hypertrophy. Left ventricular diastolic parameters are consistent with Grade I diastolic dysfunction (impaired relaxation). The average left ventricular global longitudinal strain is -10.8 %. The global longitudinal strain is abnormal.  2. Right ventricular systolic function is normal. The right ventricular size is normal.  3. The mitral valve is normal in structure. No evidence of mitral valve regurgitation.  4. The aortic valve is tricuspid. Aortic valve regurgitation is mild.  5. There is mild dilatation of the ascending aorta, measuring 39 mm.  6. The inferior vena cava is normal in size with <50% respiratory variability, suggesting right atrial pressure of 8 mmHg.   07/24/2022 Imaging   1. Improving appearance of sequela of metastatic disease. RIGHT pelvic sidewall mass obstructing the RIGHT ureter is no longer clearly seen. LEFT retroperitoneal soft tissue has diminished. 2. No signs of pulmonary metastatic disease or metastatic disease to the chest. 3. Mild RIGHT hydroureter without hydronephrosis. Resolution of hydronephrosis but with further atrophy of the RIGHT kidney. 4. Aortic atherosclerosis and 3 vessel coronary artery disease.   Aortic Atherosclerosis (ICD10-I70.0).   10/25/2022 Imaging   1. New pathologically enlarged mediastinal and left hilar lymph nodes as well as redemonstration previously treated/resolved pulmonary nodules, consistent with thoracic metastatic disease. Consider further evaluation by nuclear medicine PET-CT. 2. New subtle 14 mm hypodense lesion in the hepatic dome with increased conspicuity of a previously treated hepatic metastatic lesion in the right lobe of the liver, suspicious for new/recurrent hepatic metastatic disease. 3. Interval increase in the soft tissue thickening adjacent to loops of bowel in the right lower quadrant near the right external  iliac artery, suspicious for recurrent disease. 4. Slight interval decrease in size of the left retroperitoneal soft tissue nodule. 5. Small focus of soft tissue nodularity along the left vaginal cuff is similar prior. 6.  Aortic Atherosclerosis (ICD10-I70.0).   11/06/2022 -  Chemotherapy   Patient is on Treatment Plan : UTERINE ENDOMETRIAL Dostarlimab-gxly (500 mg) + Carboplatin (AUC 5) + Paclitaxel (175 mg/m2) q21d x 6 cycles / Dostarlimab-gxly (1000 mg) q42d x 6 cycles      Metastasis to lung ([name])  01/28/2022 Initial Diagnosis   Metastasis to lung ([name])   11/06/2022 -  Chemotherapy   Patient is on Treatment Plan : UTERINE ENDOMETRIAL Dostarlimab-gxly (500 mg) + Carboplatin (AUC 5) + Paclitaxel (175 mg/m2) q21d x 6 cycles / Dostarlimab-gxly (1000 mg) q42d x 6 cycles        PHYSICAL EXAMINATION: ECOG PERFORMANCE STATUS: 1 - Symptomatic but completely ambulatory  Vitals:   12/28/22 1034  BP: (!) 181/100  Pulse: 65  Resp: 18  Temp: (!) 97.5 F (36.4 C)  SpO2: 100%   Filed Weights   12/28/22 1034  Weight: 146 lb 3.2 oz (66.3 kg)    GENERAL:alert, no distress and comfortable SKIN: skin color, texture, turgor are normal, no rashes or significant lesions EYES: normal, Conjunctiva are pink and non-injected, sclera clear OROPHARYNX:no exudate, no erythema and lips, buccal mucosa, and tongue normal  NECK: supple, thyroid normal size, non-tender, without nodularity LYMPH:  no palpable lymphadenopathy in the cervical, axillary or inguinal LUNGS: clear to auscultation and  percussion with normal breathing effort HEART: regular rate & rhythm and no murmurs and no lower extremity edema ABDOMEN:abdomen soft, non-tender and normal bowel sounds Musculoskeletal:no cyanosis of digits and no clubbing  NEURO: alert & oriented x 3 with fluent speech, no focal motor/sensory deficits  LABORATORY DATA:  I have reviewed the data as listed    Component Value Date/Time   NA 136 12/28/2022 1010    NA 141 02/25/2019 1222   K 4.2 12/28/2022 1010   CL 102 12/28/2022 1010   CO2 26 12/28/2022 1010   GLUCOSE 311 (H) 12/28/2022 1010   BUN 24 (H) 12/28/2022 1010   BUN 17 02/25/2019 1222   CREATININE 1.25 (H) 12/28/2022 1010   CALCIUM 9.1 12/28/2022 1010   PROT 7.7 12/28/2022 1010   PROT 6.9 09/22/2019 1419   ALBUMIN 3.9 12/28/2022 1010   ALBUMIN 3.9 09/22/2019 1419   AST 15 12/28/2022 1010   ALT 12 12/28/2022 1010   ALKPHOS 56 12/28/2022 1010   BILITOT 0.4 12/28/2022 1010   GFRNONAA 50 (L) 12/28/2022 1010   GFRAA >60 05/16/2019 0335    No results found for: "SPEP", "UPEP"  Lab Results  Component Value Date   WBC 5.8 12/28/2022   NEUTROABS 4.2 12/28/2022   HGB 11.4 (L) 12/28/2022   HCT 34.8 (L) 12/28/2022   MCV 85.7 12/28/2022   PLT 279 12/28/2022      Chemistry      Component Value Date/Time   NA 136 12/28/2022 1010   NA 141 02/25/2019 1222   K 4.2 12/28/2022 1010   CL 102 12/28/2022 1010   CO2 26 12/28/2022 1010   BUN 24 (H) 12/28/2022 1010   BUN 17 02/25/2019 1222   CREATININE 1.25 (H) 12/28/2022 1010      Component Value Date/Time   CALCIUM 9.1 12/28/2022 1010   ALKPHOS 56 12/28/2022 1010   AST 15 12/28/2022 1010   ALT 12 12/28/2022 1010   BILITOT 0.4 12/28/2022 1010

## 2022-12-28 NOTE — Assessment & Plan Note (Signed)

## 2022-12-28 NOTE — Assessment & Plan Note (Signed)
She has poorly controlled hypertension and is not taking care of herself I recommend she takes full dose losartan and we will monitor her kidney function and potassium level carefully Refilled her prescription today

## 2022-12-28 NOTE — Progress Notes (Signed)
Dr. Bertis Ruddy aware of patient blood pressure 180s/100s. Patient's blood pressure meds adjusted. No added blood pressure meds for now. Proceed with treatment per MD.  Patient BP continues to be high. 190/105 manually at end of treatment. Patient declined any further medical intervention at the cancer center and insisted on going home any taking her medications. Patient aware of stroke risk- was educated. Patient stated that she has a BP machine and will follow-up on blood pressures at home. Discussed reasonable BP levels.

## 2022-12-28 NOTE — Assessment & Plan Note (Signed)
She has intermittent acute on chronic renal failure We discussed importance of hydration and risk factor modification She can proceed as long as creatinine is less than 2 

## 2022-12-28 NOTE — Patient Instructions (Signed)
[facility]  Discharge Instructions: Thank you for choosing [facility] to provide your oncology and hematology care.   If you have a lab appointment with the Cancer Center, please go directly to the Cancer Center and check in at the registration area.   Wear comfortable clothing and clothing appropriate for easy access to any Portacath or PICC line.   We strive to give you quality time with your provider. You may need to reschedule your appointment if you arrive late (15 or more minutes).  Arriving late affects you and other patients whose appointments are after yours.  Also, if you miss three or more appointments without notifying the office, you may be dismissed from the clinic at the provider's discretion.      For prescription refill requests, have your pharmacy contact our office and allow 72 hours for refills to be completed.    Today you received the following chemotherapy and/or immunotherapy agents: Dostarlimab (Jemperli), Carboplatin, Paclitaxel      To help prevent nausea and vomiting after your treatment, we encourage you to take your nausea medication as directed.  BELOW ARE SYMPTOMS THAT SHOULD BE REPORTED IMMEDIATELY: *FEVER GREATER THAN 100.4 F (38 C) OR HIGHER *CHILLS OR SWEATING *NAUSEA AND VOMITING THAT IS NOT CONTROLLED WITH YOUR NAUSEA MEDICATION *UNUSUAL SHORTNESS OF BREATH *UNUSUAL BRUISING OR BLEEDING *URINARY PROBLEMS (pain or burning when urinating, or frequent urination) *BOWEL PROBLEMS (unusual diarrhea, constipation, pain near the anus) TENDERNESS IN MOUTH AND THROAT WITH OR WITHOUT PRESENCE OF ULCERS (sore throat, sores in mouth, or a toothache) UNUSUAL RASH, SWELLING OR PAIN  UNUSUAL VAGINAL DISCHARGE OR ITCHING   Items with * indicate a potential emergency and should be followed up as soon as possible or go to the Emergency Department if any problems should occur.  Please show the CHEMOTHERAPY ALERT  CARD or IMMUNOTHERAPY ALERT CARD at check-in to the Emergency Department and triage nurse.  Should you have questions after your visit or need to cancel or reschedule your appointment, please contact [facility]  Dept: [phone]  and follow the prompts.  Office hours are 8:00 a.m. to 4:30 p.m. Monday - Friday. Please note that voicemails left after 4:00 p.m. may not be returned until the following business day.  We are closed weekends and major holidays. You have access to a nurse at all times for urgent questions. Please call the main number to the clinic Dept: [phone] and follow the prompts.   For any non-urgent questions, you may also contact your provider using MyChart. We now offer e-Visits for anyone 18 and older to request care online for non-urgent symptoms. For details visit [email or web address].   Also download the MyChart app! Go to the app store, search "MyChart", open the app, select [facility], and log in with your MyChart username and password.  Masks are optional in the cancer centers. If you would like for your care team to wear a mask while they are taking care of you, please let them know. You may have one support person who is at least 60 years old accompany you for your appointments. Dostarlimab Injection What is this medication? DOSTARLIMAB (dos tar li mab) treats some types of cancer. It works by helping your immune system slow or stop the spread of cancer cells. It is a monoclonal antibody. This medicine may be used for other purposes; ask your health care provider or pharmacist if you have questions.   COMMON BRAND NAME(S): Jemperli What should I tell my care team before I take this medication? They need to know if you have any of these conditions: Allogeneic stem cell transplant (uses someone else's stem cells) Autoimmune diseases, such as Crohn disease, ulcerative colitis, lupus History of chest radiation Nervous system  problems, such as Guillain-Barre syndrome, myasthenia gravis Organ transplant An unusual or allergic reaction to dostarlimab, other medications, foods, dyes, or preservatives Pregnant or trying to get pregnant Breast-feeding How should I use this medication? This medication is injected into a vein. It is given by your care team in a hospital or clinic setting. A special MedGuide will be given to you before each treatment. Be sure to read this information carefully each time. Talk to your care team about the use of this medication in children. Special care may be needed. Overdosage: If you think you have taken too much of this medicine contact a poison control center or emergency room at once. NOTE: This medicine is only for you. Do not share this medicine with others. What if I miss a dose? Keep appointments for follow-up doses. It is important not to miss your dose. Call your care team if you are unable to keep an appointment. What may interact with this medication? Interactions have not been studied. This list may not describe all possible interactions. Give your health care provider a list of all the medicines, herbs, non-prescription drugs, or dietary supplements you use. Also tell them if you smoke, drink alcohol, or use illegal drugs. Some items may interact with your medicine. What should I watch for while using this medication? Your condition will be monitored carefully while you are receiving this medication. You may need blood work while taking this medication. This medication may cause serious skin reactions. They can happen weeks to months after starting the medication. Contact your care team right away if you notice fevers or flu-like symptoms with a rash. The rash may be red or purple and then turn into blisters or peeling of the skin. You may also notice a red rash with swelling of the face, lips, or lymph nodes in your neck or under your arms. Tell your care team right away if you  have any change in your eyesight. Talk to your care team if you may be pregnant. Serious birth defects can occur if you take this medication during pregnancy and for 4 months after the last dose. You will need a negative pregnancy test before starting this medication. Contraception is recommended while taking this medication and for 4 months after the last dose. Your care team can help you find the option that works for you. Do not breastfeed while taking this medication and for 4 months after the last dose. What side effects may I notice from receiving this medication? Side effects that you should report to your care team as soon as possible: Allergic reactions--skin rash, itching, hives, swelling of the face, lips, tongue, or throat Dry cough, shortness of breath or trouble breathing Eye pain, redness, irritation, or discharge with blurry or decreased vision Heart muscle inflammation--unusual weakness or fatigue, shortness of breath, chest pain, fast or irregular heartbeat, dizziness, swelling of the ankles, feet, or hands Hormone gland problems--headache, sensitivity to light, unusual weakness or fatigue, dizziness, fast or irregular heartbeat, increased sensitivity to cold or heat, excessive sweating, constipation, hair loss, increased thirst or amount of urine, tremors or shaking, irritability Infusion reactions--chest pain, shortness of breath or trouble breathing, feeling faint or lightheaded Kidney   injury (glomerulonephritis)--decrease in the amount of urine, red or dark brown urine, foamy or bubbly urine, swelling of the ankles, hands, or feet Liver injury--right upper belly pain, loss of appetite, nausea, light-colored stool, dark yellow or brown urine, yellowing skin or eyes, unusual weakness or fatigue Pain, tingling, or numbness in the hands or feet, muscle weakness, change in vision, confusion or trouble speaking, loss of balance or coordination, trouble walking, seizures Rash, fever, and  swollen lymph nodes Redness, blistering, peeling, or loosening of the skin, including inside the mouth Sudden or severe stomach pain, bloody diarrhea, fever, nausea, vomiting Side effects that usually do not require medical attention (report these to your care team if they continue or are bothersome): Bone, joint, or muscle pain Diarrhea Fatigue Loss of appetite Nausea Skin rash This list may not describe all possible side effects. Call your doctor for medical advice about side effects. You may report side effects to FDA at 1-800-FDA-1088. Where should I keep my medication? This medication is given in a hospital or clinic. It will not be stored at home. NOTE: This sheet is a summary. It may not cover all possible information. If you have questions about this medicine, talk to your doctor, pharmacist, or health care provider.  2023 Elsevier/Gold Standard (2021-10-13 00:00:00)  

## 2022-12-28 NOTE — Assessment & Plan Note (Signed)
So far, she tolerated treatment well except for constipation and uncontrolled hypertension We discussed importance of management of hypertension and constipation We will proceed with treatment without delay I recommend repeat imaging study next month

## 2022-12-30 LAB — T4: T4, Total: 7.4 ug/dL (ref 4.5–12.0)

## 2023-01-09 ENCOUNTER — Other Ambulatory Visit: Payer: Medicare HMO

## 2023-01-09 ENCOUNTER — Ambulatory Visit: Payer: Medicare HMO | Admitting: Hematology and Oncology

## 2023-01-09 ENCOUNTER — Ambulatory Visit: Payer: Medicare HMO

## 2023-01-18 ENCOUNTER — Telehealth: Payer: Self-pay

## 2023-01-18 ENCOUNTER — Inpatient Hospital Stay: Payer: Medicare HMO

## 2023-01-18 ENCOUNTER — Ambulatory Visit (HOSPITAL_COMMUNITY)
Admission: RE | Admit: 2023-01-18 | Discharge: 2023-01-18 | Disposition: A | Discharge status: Home / Self Care | Payer: Medicare HMO | Source: Ambulatory Visit | Attending: Hematology and Oncology | Admitting: Hematology and Oncology

## 2023-01-18 DIAGNOSIS — C541 Malignant neoplasm of endometrium: Secondary | ICD-10-CM | POA: Diagnosis present

## 2023-01-18 DIAGNOSIS — C7801 Secondary malignant neoplasm of right lung: Secondary | ICD-10-CM | POA: Diagnosis present

## 2023-01-18 DIAGNOSIS — C7802 Secondary malignant neoplasm of left lung: Secondary | ICD-10-CM | POA: Insufficient documentation

## 2023-01-18 LAB — POCT I-STAT CREATININE: Creatinine, Ser: 1.4 mg/dL — ABNORMAL HIGH (ref 0.44–1.00)

## 2023-01-18 MED ORDER — IOHEXOL 300 MG/ML  SOLN
100.0000 mL | Freq: Once | INTRAMUSCULAR | Status: AC | PRN
  Administered 2023-01-18: 80 mL via INTRAVENOUS

## 2023-01-18 NOTE — Telephone Encounter (Signed)
Called and moved port lab flush to 6/11 at 0745 prior to MD appt. She is aware of appt.

## 2023-01-22 MED FILL — Dexamethasone Sodium Phosphate Inj 100 MG/10ML: INTRAMUSCULAR | Qty: 1 | Status: AC

## 2023-01-22 MED FILL — Fosaprepitant Dimeglumine For IV Infusion 150 MG (Base Eq): INTRAVENOUS | Qty: 5 | Status: AC

## 2023-01-23 ENCOUNTER — Inpatient Hospital Stay: Payer: Medicare HMO

## 2023-01-23 ENCOUNTER — Encounter: Payer: Self-pay | Admitting: Hematology and Oncology

## 2023-01-23 ENCOUNTER — Inpatient Hospital Stay: Payer: Medicare HMO | Attending: Hematology and Oncology

## 2023-01-23 ENCOUNTER — Inpatient Hospital Stay (HOSPITAL_BASED_OUTPATIENT_CLINIC_OR_DEPARTMENT_OTHER): Payer: Medicare HMO | Admitting: Hematology and Oncology

## 2023-01-23 VITALS — BP 190/105 | HR 67 | Temp 97.8°F | Resp 18 | Ht 63.0 in | Wt 149.4 lb

## 2023-01-23 VITALS — BP 178/98 | HR 80 | Resp 17

## 2023-01-23 DIAGNOSIS — Z79899 Other long term (current) drug therapy: Secondary | ICD-10-CM | POA: Diagnosis not present

## 2023-01-23 DIAGNOSIS — N1330 Unspecified hydronephrosis: Secondary | ICD-10-CM

## 2023-01-23 DIAGNOSIS — E1140 Type 2 diabetes mellitus with diabetic neuropathy, unspecified: Secondary | ICD-10-CM | POA: Diagnosis not present

## 2023-01-23 DIAGNOSIS — C541 Malignant neoplasm of endometrium: Secondary | ICD-10-CM

## 2023-01-23 DIAGNOSIS — Z5111 Encounter for antineoplastic chemotherapy: Secondary | ICD-10-CM | POA: Diagnosis present

## 2023-01-23 DIAGNOSIS — I10 Essential (primary) hypertension: Secondary | ICD-10-CM | POA: Diagnosis not present

## 2023-01-23 DIAGNOSIS — C7801 Secondary malignant neoplasm of right lung: Secondary | ICD-10-CM | POA: Diagnosis not present

## 2023-01-23 DIAGNOSIS — Z5112 Encounter for antineoplastic immunotherapy: Secondary | ICD-10-CM | POA: Insufficient documentation

## 2023-01-23 DIAGNOSIS — E1165 Type 2 diabetes mellitus with hyperglycemia: Secondary | ICD-10-CM | POA: Insufficient documentation

## 2023-01-23 DIAGNOSIS — C7802 Secondary malignant neoplasm of left lung: Secondary | ICD-10-CM

## 2023-01-23 DIAGNOSIS — Z87891 Personal history of nicotine dependence: Secondary | ICD-10-CM | POA: Insufficient documentation

## 2023-01-23 LAB — CBC WITH DIFFERENTIAL (CANCER CENTER ONLY)
Abs Immature Granulocytes: 0.01 10*3/uL (ref 0.00–0.07)
Basophils Absolute: 0 10*3/uL (ref 0.0–0.1)
Basophils Relative: 0 %
Eosinophils Absolute: 0 10*3/uL (ref 0.0–0.5)
Eosinophils Relative: 0 %
HCT: 32.2 % — ABNORMAL LOW (ref 36.0–46.0)
Hemoglobin: 10.5 g/dL — ABNORMAL LOW (ref 12.0–15.0)
Immature Granulocytes: 0 %
Lymphocytes Relative: 25 %
Lymphs Abs: 1 10*3/uL (ref 0.7–4.0)
MCH: 28.5 pg (ref 26.0–34.0)
MCHC: 32.6 g/dL (ref 30.0–36.0)
MCV: 87.3 fL (ref 80.0–100.0)
Monocytes Absolute: 0.1 10*3/uL (ref 0.1–1.0)
Monocytes Relative: 1 %
Neutro Abs: 3.1 10*3/uL (ref 1.7–7.7)
Neutrophils Relative %: 74 %
Platelet Count: 288 10*3/uL (ref 150–400)
RBC: 3.69 MIL/uL — ABNORMAL LOW (ref 3.87–5.11)
RDW: 17.6 % — ABNORMAL HIGH (ref 11.5–15.5)
WBC Count: 4.2 10*3/uL (ref 4.0–10.5)
nRBC: 0 % (ref 0.0–0.2)

## 2023-01-23 LAB — CMP (CANCER CENTER ONLY)
ALT: 18 U/L (ref 0–44)
AST: 19 U/L (ref 15–41)
Albumin: 3.8 g/dL (ref 3.5–5.0)
Alkaline Phosphatase: 57 U/L (ref 38–126)
Anion gap: 7 (ref 5–15)
BUN: 28 mg/dL — ABNORMAL HIGH (ref 6–20)
CO2: 26 mmol/L (ref 22–32)
Calcium: 9.1 mg/dL (ref 8.9–10.3)
Chloride: 104 mmol/L (ref 98–111)
Creatinine: 1.35 mg/dL — ABNORMAL HIGH (ref 0.44–1.00)
GFR, Estimated: 45 mL/min — ABNORMAL LOW (ref >60)
Glucose, Bld: 273 mg/dL — ABNORMAL HIGH (ref 70–99)
Potassium: 4.1 mmol/L (ref 3.5–5.1)
Sodium: 137 mmol/L (ref 135–145)
Total Bilirubin: 0.3 mg/dL (ref 0.3–1.2)
Total Protein: 7.9 g/dL (ref 6.5–8.1)

## 2023-01-23 MED ORDER — INSULIN ASPART 100 UNIT/ML IJ SOLN
10.0000 [IU] | Freq: Once | INTRAMUSCULAR | Status: AC
  Administered 2023-01-23: 10 [IU] via SUBCUTANEOUS
  Filled 2023-01-23: qty 1

## 2023-01-23 MED ORDER — SODIUM CHLORIDE 0.9 % IV SOLN
10.0000 mg | Freq: Once | INTRAVENOUS | Status: AC
  Administered 2023-01-23: 10 mg via INTRAVENOUS
  Filled 2023-01-23: qty 10

## 2023-01-23 MED ORDER — LOSARTAN POTASSIUM 100 MG PO TABS: 100.0000 mg | ORAL_TABLET | Freq: Every day | ORAL | 1 refills | Status: DC

## 2023-01-23 MED ORDER — SODIUM CHLORIDE 0.9% FLUSH
10.0000 mL | INTRAVENOUS | Status: DC | PRN
  Administered 2023-01-23: 10 mL

## 2023-01-23 MED ORDER — FAMOTIDINE IN NACL 20-0.9 MG/50ML-% IV SOLN
20.0000 mg | Freq: Once | INTRAVENOUS | Status: AC
  Administered 2023-01-23: 20 mg via INTRAVENOUS
  Filled 2023-01-23: qty 50

## 2023-01-23 MED ORDER — SODIUM CHLORIDE 0.9 % IV SOLN
150.0000 mg | Freq: Once | INTRAVENOUS | Status: AC
  Administered 2023-01-23: 150 mg via INTRAVENOUS
  Filled 2023-01-23: qty 150

## 2023-01-23 MED ORDER — HEPARIN SOD (PORK) LOCK FLUSH 100 UNIT/ML IV SOLN
500.0000 [IU] | Freq: Once | INTRAVENOUS | Status: AC | PRN
  Administered 2023-01-23: 500 [IU]

## 2023-01-23 MED ORDER — CETIRIZINE HCL 10 MG/ML IV SOLN
10.0000 mg | Freq: Once | INTRAVENOUS | Status: AC
  Administered 2023-01-23: 10 mg via INTRAVENOUS
  Filled 2023-01-23: qty 1

## 2023-01-23 MED ORDER — SODIUM CHLORIDE 0.9% FLUSH
10.0000 mL | Freq: Once | INTRAVENOUS | Status: AC
  Administered 2023-01-23: 10 mL

## 2023-01-23 MED ORDER — SODIUM CHLORIDE 0.9 % IV SOLN
131.2500 mg/m2 | Freq: Once | INTRAVENOUS | Status: AC
  Administered 2023-01-23: 228 mg via INTRAVENOUS
  Filled 2023-01-23: qty 38

## 2023-01-23 MED ORDER — SODIUM CHLORIDE 0.9 % IV SOLN
359.5000 mg | Freq: Once | INTRAVENOUS | Status: AC
  Administered 2023-01-23: 360 mg via INTRAVENOUS
  Filled 2023-01-23: qty 36

## 2023-01-23 MED ORDER — PALONOSETRON HCL INJECTION 0.25 MG/5ML
0.2500 mg | Freq: Once | INTRAVENOUS | Status: AC
  Administered 2023-01-23: 0.25 mg via INTRAVENOUS
  Filled 2023-01-23: qty 5

## 2023-01-23 MED ORDER — SODIUM CHLORIDE 0.9 % IV SOLN: Freq: Once | INTRAVENOUS | Status: AC

## 2023-01-23 MED ORDER — SODIUM CHLORIDE 0.9 % IV SOLN
500.0000 mg | Freq: Once | INTRAVENOUS | Status: AC
  Administered 2023-01-23: 500 mg via INTRAVENOUS
  Filled 2023-01-23: qty 10

## 2023-01-23 NOTE — Assessment & Plan Note (Signed)
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

## 2023-01-23 NOTE — Progress Notes (Signed)
[facility] OFFICE PROGRESS NOTE  Patient Care Team: Verlon Au, MD as PCP - General (Family Medicine) Runell Gess, MD as PCP - Cardiology (Cardiology) Artis Delay, MD as Consulting Physician (Hematology and Oncology)  ASSESSMENT & PLAN:  Endometrial cancer [facility]) I have reviewed her CT imaging She has positive response to therapy with resolution of pulmonary metastasis The plan will be to continue treatment for 3 more months We discussed importance of blood pressure and blood sugar control while on treatment  Type 2 diabetes mellitus ([name]) She has severe uncontrolled hyperglycemia due to steroid treatment She will receive 1 dose of insulin today  Essential hypertension She has poorly controlled hypertension and is not taking care of herself I recommend she takes full dose 100 mg losartan and we will monitor her kidney function and potassium level carefully I instructed the patient to hold off spironolactone due to interaction I recommend the patient to reach out to her primary care doctor for blood pressure medication adjustment  Hydronephrosis of right kidney This is stable Her renal function is stable Observe closely  Orders Placed This Encounter  Procedures   CBC with Differential (Cancer Center Only)    Standing Status:   Future    Standing Expiration Date:   03/26/2024   CMP (Cancer Center only)    Standing Status:   Future    Standing Expiration Date:   03/26/2024   T4    Standing Status:   Future    Standing Expiration Date:   03/26/2024   TSH    Standing Status:   Future    Standing Expiration Date:   03/26/2024    All questions were answered. The patient knows to call the clinic with any problems, questions or concerns. The total time spent in the appointment was 40 minutes encounter with patients including review of chart and various tests results, discussions about plan of care and coordination of care plan   Artis Delay,  MD 01/23/2023 9:11 AM  INTERVAL HISTORY: Please see below for problem oriented charting. she returns for treatment follow-up with her mother She has not been taking care of herself Her blood sugar and blood pressure were consistently high and she took no action to remedy it Otherwise, she tolerated treatment well She has quit smoking We discussed CT imaging findings and plan of care  REVIEW OF SYSTEMS:   Constitutional: Denies fevers, chills or abnormal weight loss Eyes: Denies blurriness of vision Ears, nose, mouth, throat, and face: Denies mucositis or sore throat Respiratory: Denies cough, dyspnea or wheezes Cardiovascular: Denies palpitation, chest discomfort or lower extremity swelling Gastrointestinal:  Denies nausea, heartburn or change in bowel habits Skin: Denies abnormal skin rashes Lymphatics: Denies new lymphadenopathy or easy bruising Neurological:Denies numbness, tingling or new weaknesses Behavioral/Psych: Mood is stable, no new changes  All other systems were reviewed with the patient and are negative.  I have reviewed the past medical history, past surgical history, social history and family history with the patient and they are unchanged from previous note.  ALLERGIES:  is allergic to statins, agrostis alba pollen extract [gramineae pollens], and zetia [ezetimibe].  MEDICATIONS:  Current Outpatient Medications  Medication Sig Dispense Refill   acetaminophen (TYLENOL) 500 MG tablet Take 1,000 mg by mouth every 6 (six) hours as needed for mild pain or headache.     aspirin EC 81 MG tablet Take 1 tablet (81 mg total) by mouth daily. Swallow whole.     dexamethasone (DECADRON) 4  MG tablet Take 2 tabs at the night before chemotherapy, every 3 weeks, by mouth x 6 cycles 12 tablet 6   Evolocumab (REPATHA SURECLICK) 140 MG/ML SOAJ Inject 140 mg into the skin every 14 (fourteen) days. 2 pen 11   lidocaine-prilocaine (EMLA) cream Apply 1 application topically as needed. 30  g 0   loratadine (CLARITIN) 10 MG tablet Take 1 tablet (10 mg total) by mouth daily as needed for allergies. 30 tablet 11   losartan (COZAAR) 100 MG tablet Take 1 tablet (100 mg total) by mouth daily. 30 tablet 1   metFORMIN (GLUCOPHAGE) 1000 MG tablet Take 1,000 mg by mouth 2 (two) times daily with a meal.     metoprolol succinate (TOPROL XL) 25 MG 24 hr tablet Take 1 tablet (25 mg total) by mouth daily. 90 tablet 3   nitroGLYCERIN (NITROSTAT) 0.4 MG SL tablet Place 1 tablet (0.4 mg total) under the tongue every 5 (five) minutes as needed for chest pain. 30 tablet 2   ondansetron (ZOFRAN) 8 MG tablet Take 1 tablet (8 mg total) by mouth every 8 (eight) hours as needed for nausea. 30 tablet 3   polyethylene glycol (MIRALAX) 17 g packet Take 17 g by mouth daily. 30 each 1   prochlorperazine (COMPAZINE) 10 MG tablet Take 1 tablet (10 mg total) by mouth every 6 (six) hours as needed for nausea or vomiting. 30 tablet 0   senna-docusate (SENOKOT-S) 8.6-50 MG tablet Take 2 tablets by mouth 2 (two) times daily. 90 tablet 1   No current facility-administered medications for this visit.    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    12/14/2020 Imaging   CT chest abdomen and pelvis IMPRESSION:  1.  2 small nodules in the right lung, nonspecific.  2.  Prominent precarinal lymph node versus adjacent small nodes.  3.  Enlarged heterogeneous uterine fundus.  4.  No definite adenopathy in the abdomen or pelvis. Nonspecific small lymph nodes are present, not enlarged by CT criteria.  5.  Mild wall thickening of the anterior aspect of the urinary bladder which may be due to to nondistention or inflammation.    12/27/2020 Pathology Results   SPECIMEN     Procedure:     Total hysterectomy and bilateral salpingo-oophorectomy   TUMOR     Histologic Type:    Serous carcinoma     Histologic Grade:    Not applicable     Myometrial Invasion:    Present       Depth of Myometrial Invasion:    11 mm       Myometrial Thickness:    12 mm       Percentage of Myometrial Invasion:    92 %     Uterine Serosa Involvement:    Not identified     Cervical Stromal Involvement:    Not identified     Other Tissue / Organ Involvement:    Not identified     Peritoneal / Ascitic Fluid:    Not identified     Lymphovascular Invasion (LVI):    Present   MARGINS     Margin Status:    Not applicable   REGIONAL LYMPH NODES     Regional Lymph Node Status:           :  All regional lymph nodes negative for tumor cells         Lymph Nodes Examined:               Total Number of Pelvic Nodes Examined:    3           Number of Pelvic Sentinel Nodes Examined:    3           Total Number of Para-aortic Nodes Examined:    0           Number of Para-aortic Sentinel Nodes Examined:    Not applicable   DISTANT METASTASIS     Distant Site(s) Involved:    Not applicable   PATHOLOGIC STAGE CLASSIFICATION (pTNM, AJCC 8th Edition)     Reporting of pT, pN, and (when applicable) pM categories is based on information available to the pathologist at the time the report is issued. As per the AJCC (Chapter 1, 8th Ed.) it is the managing physician's responsibility to establish the final pathologic stage based upon all pertinent information, including but potentially not limited to this pathology report.     TNM Descriptors:    Not applicable     Tumor Modifier:    Not applicable     pT Category:    pT1b     Regional Lymph Nodes Modifier:    (sn)     pN Category:    pN0   The endometrial adenocarcinoma was analyzed by IHC for DNA mismatch repair proteins.  The neoplasm retained nuclear expression of all four mismatch repair proteins, MLH1, PMS2, MSH2, MSH6.    Estrogen receptors (ER):  Negative Progesterone receptors (PR): Negative  Controls worked appropriately.   A HER2 stain is POSITIVE (3+).   12/27/2020 Surgery   At [facility], she underwent a robotic hysterectomy/BSO, sentinel node dissection and partial omentectomy. She was found to have an enlarged uterus. An enlarged right pelvic sentinel node.    02/23/2021 Echocardiogram   Left Ventricle: Systolic function is normal. EF: 55-60%.    Left Ventricle: Doppler parameters consistent with mild diastolic  dysfunction and low to normal LA pressure.    Left Ventricle: There is mild concentric hypertrophy.    Aortic Valve: Mild aortic valve regurgitation.   01/27/2022 Imaging   CT chest imaging 1. No pulmonary embolus. 2. Findings consistent with intrathoracic malignancy. Multiple pulmonary nodules throughout both lungs of varying sizes, consistent with metastatic disease. Primary site of malignancy may represent a 4 cm spiculated nodule in the right perihilar region spanning the fissure that is cavitating. Alternatively all of these nodules may be metastatic given history of cancer, type not specified.  3. Multifocal mediastinal and bilateral hilar adenopathy, consistent metastatic disease. 4. Nondisplaced left anterior seventh and eighth rib fractures, soft tissue thickening adjacent to the seventh rib fracture suggests this may be pathologic 5. Right hydronephrosis is partially included in the field of view in the upper abdomen. Small soft tissue density medial to the right hepatic lobe posterior to the right kidney is nonspecific but may represent metastatic disease. Recommend staging CT of the abdomen and pelvis with oral and IV contrast. 6. Moderate emphysema. 7. Aortic atherosclerosis.  Coronary artery calcifications.   01/28/2022 Initial Diagnosis   Endometrial cancer ([name])   01/28/2022 Cancer Staging   Staging form: Corpus Uteri - Carcinoma and Carcinosarcoma, AJCC 8th Edition - Clinical stage from 01/28/2022: FIGO  Stage IVB (rcT1b, cN2a, cM1) - Signed by Artis Delay, MD on 01/28/2022 Stage prefix: Recurrence  01/28/2022 Imaging   CT abdomen and pelvis 1. Multiple abdominal and pelvic metastatic implants as above. 2. Moderate right hydronephrosis secondary to mass effect and obstruction of the distal right ureter by a right pelvic metastatic implant. 3. Multiple pulmonary metastatic disease in the visualized right lung base. 4. No bowel obstruction. Normal appendix. 5. Aortic Atherosclerosis (ICD10-I70.0).   01/30/2022 Echocardiogram    1. Left ventricular ejection fraction, by estimation, is 55 to 60%. The left ventricle has normal function. The left ventricle has no regional wall motion abnormalities. There is moderate concentric left ventricular  hypertrophy. Left ventricular diastolic parameters are consistent with Grade I diastolic dysfunction (impaired relaxation).   2. Right ventricular systolic function is normal. The right ventricular size is normal. There is normal pulmonary artery systolic pressure.   3. Left atrial size was mildly dilated.   4. The mitral valve is normal in structure. Trivial mitral valve regurgitation. No evidence of mitral stenosis.   5. The aortic valve is tricuspid. Aortic valve regurgitation is trivial. No aortic stenosis is present.   6. Aortic dilatation noted. There is mild dilatation of the aortic root, measuring 39 mm.   7. The inferior vena cava is normal in size with <50% respiratory variability, suggesting right atrial pressure of 8 mmHg.    01/31/2022 - 04/04/2022 Chemotherapy   Patient is on Treatment Plan : UTERINE SEROUS CARCINOMA Carboplatin + Paclitaxel + Trastuzumab q21d x 6 Cycles / Trastuzumab q21d     01/31/2022 - 05/18/2022 Chemotherapy   Patient is on Treatment Plan : UTERINE SEROUS CARCINOMA Carboplatin + Paclitaxel + Trastuzumab q21d x 6 Cycles / Trastuzumab q21d     04/03/2022 Imaging   1. Pulmonary nodules are either reduced in size or completely  resolved. No new nodules. 2. Marked reduction in size peritoneal nodular metastasis. Multiple nodular lesions are no longer measurable. 3. Interval reduction in size of implant in the RIGHT operator space. While this implant is smaller, lesion continues to obstruct the RIGHT ureter with persistent RIGHT hydronephrosis and hydroureter. 4. No new metastatic lesions are present.   05/09/2022 Echocardiogram    1. Left ventricular ejection fraction, by estimation, is 45 to 50%. The left ventricle has mildly decreased function. The left ventricle demonstrates global hypokinesis. There is moderate concentric left ventricular hypertrophy. Left ventricular diastolic parameters are consistent with Grade I diastolic dysfunction (impaired relaxation). The average left ventricular global longitudinal strain is -10.8 %. The global longitudinal strain is abnormal.  2. Right ventricular systolic function is normal. The right ventricular size is normal.  3. The mitral valve is normal in structure. No evidence of mitral valve regurgitation.  4. The aortic valve is tricuspid. Aortic valve regurgitation is mild.  5. There is mild dilatation of the ascending aorta, measuring 39 mm.  6. The inferior vena cava is normal in size with <50% respiratory variability, suggesting right atrial pressure of 8 mmHg.   07/24/2022 Imaging   1. Improving appearance of sequela of metastatic disease. RIGHT pelvic sidewall mass obstructing the RIGHT ureter is no longer clearly seen. LEFT retroperitoneal soft tissue has diminished. 2. No signs of pulmonary metastatic disease or metastatic disease to the chest. 3. Mild RIGHT hydroureter without hydronephrosis. Resolution of hydronephrosis but with further atrophy of the RIGHT kidney. 4. Aortic atherosclerosis and 3 vessel coronary artery disease.   Aortic Atherosclerosis (ICD10-I70.0).   10/25/2022 Imaging   1. New pathologically enlarged mediastinal and left hilar lymph nodes as well as  redemonstration previously treated/resolved pulmonary nodules,  consistent with thoracic metastatic disease. Consider further evaluation by nuclear medicine PET-CT. 2. New subtle 14 mm hypodense lesion in the hepatic dome with increased conspicuity of a previously treated hepatic metastatic lesion in the right lobe of the liver, suspicious for new/recurrent hepatic metastatic disease. 3. Interval increase in the soft tissue thickening adjacent to loops of bowel in the right lower quadrant near the right external iliac artery, suspicious for recurrent disease. 4. Slight interval decrease in size of the left retroperitoneal soft tissue nodule. 5. Small focus of soft tissue nodularity along the left vaginal cuff is similar prior. 6.  Aortic Atherosclerosis (ICD10-I70.0).   11/06/2022 -  Chemotherapy   Patient is on Treatment Plan : UTERINE ENDOMETRIAL Dostarlimab-gxly (500 mg) + Carboplatin (AUC 5) + Paclitaxel (175 mg/m2) q21d x 6 cycles / Dostarlimab-gxly (1000 mg) q42d x 6 cycles      01/23/2023 Imaging   CT CHEST ABDOMEN PELVIS W CONTRAST  Result Date: 01/22/2023 CLINICAL DATA:  Follow-up metastatic endometrial carcinoma. Undergoing chemotherapy. * Tracking Code: BO * EXAM: CT CHEST, ABDOMEN, AND PELVIS WITH CONTRAST TECHNIQUE: Multidetector CT imaging of the chest, abdomen and pelvis was performed following the standard protocol during bolus administration of intravenous contrast. RADIATION DOSE REDUCTION: This exam was performed according to the departmental dose-optimization program which includes automated exposure control, adjustment of the mA and/or kV according to patient size and/or use of iterative reconstruction technique. CONTRAST:  80mL OMNIPAQUE IOHEXOL 300 MG/ML  SOLN COMPARISON:  10/24/2022 FINDINGS: CT CHEST FINDINGS Cardiovascular: No acute findings. Mediastinum/Lymph Nodes: Mediastinal and left hilar lymphadenopathy is decreased since previous study. Precarinal lymph node currently  measures 11 mm on image 26/2 compared to 15 mm previously. Previously seen left hilar lymphadenopathy is no longer visualized. No new or increased sites of lymphadenopathy identified. Lungs/Pleura: Previously seen pulmonary nodule in the central left upper lobe has resolved since prior study. Other nodule in the superior segment of the right lower lobe has also resolved. No suspicious pulmonary nodules or masses identified. No evidence of infiltrate or pleural effusion. Musculoskeletal:  No suspicious bone lesions identified. CT ABDOMEN AND PELVIS FINDINGS Hepatobiliary: No suspicious liver lesions seen on today's exam. Previously suspected small lesion in the liver dome is no longer visualized. Gallbladder is unremarkable. No evidence of biliary ductal dilatation. Pancreas:  No mass or inflammatory changes. Spleen:  Within normal limits in size and appearance. Adrenals/Urinary tract: No suspicious masses or hydronephrosis. Chronic right renal atrophy remains stable. Stomach/Bowel: No evidence of obstruction, inflammatory process, or abnormal fluid collections. Vascular/Lymphatic: No pathologically enlarged lymph nodes identified. No acute vascular findings. Aortic atherosclerotic calcification incidentally noted. Reproductive: Prior hysterectomy noted. Vaginal cuff is unremarkable in appearance. No pelvic mass identified. Adnexal regions are unremarkable in appearance. Other:  None. Musculoskeletal:  No suspicious bone lesions identified. IMPRESSION: Resolution of pulmonary metastases since prior exam. Interval decrease in mediastinal and left hilar lymphadenopathy. Previously suspected small liver lesion and other subtle areas of soft tissue nodularity in the abdomen and pelvis are no longer visualized. No new or progressive disease. 1 Aortic Atherosclerosis (ICD10-I70.0). Electronically Signed   By: Danae Orleans M.D.   On: 01/22/2023 15:27      Metastasis to lung ([name])  01/28/2022 Initial Diagnosis    Metastasis to lung ([name])   11/06/2022 -  Chemotherapy   Patient is on Treatment Plan : UTERINE ENDOMETRIAL Dostarlimab-gxly (500 mg) + Carboplatin (AUC 5) + Paclitaxel (175 mg/m2) q21d x 6 cycles / Dostarlimab-gxly (1000 mg) q42d x 6 cycles  PHYSICAL EXAMINATION: ECOG PERFORMANCE STATUS: 1 - Symptomatic but completely ambulatory  Vitals:   01/23/23 0835  BP: (!) 190/105  Pulse: 67  Resp: 18  Temp: 97.8 F (36.6 C)  SpO2: 100%   Filed Weights   01/23/23 0835  Weight: 149 lb 6.4 oz (67.8 kg)    GENERAL:alert, no distress and comfortable  NEURO: alert & oriented x 3 with fluent speech, no focal motor/sensory deficits  LABORATORY DATA:  I have reviewed the data as listed    Component Value Date/Time   NA 137 01/23/2023 0811   NA 141 02/25/2019 1222   K 4.1 01/23/2023 0811   CL 104 01/23/2023 0811   CO2 26 01/23/2023 0811   GLUCOSE 273 (H) 01/23/2023 0811   BUN 28 (H) 01/23/2023 0811   BUN 17 02/25/2019 1222   CREATININE 1.35 (H) 01/23/2023 0811   CALCIUM 9.1 01/23/2023 0811   PROT 7.9 01/23/2023 0811   PROT 6.9 09/22/2019 1419   ALBUMIN 3.8 01/23/2023 0811   ALBUMIN 3.9 09/22/2019 1419   AST 19 01/23/2023 0811   ALT 18 01/23/2023 0811   ALKPHOS 57 01/23/2023 0811   BILITOT 0.3 01/23/2023 0811   GFRNONAA 45 (L) 01/23/2023 0811   GFRAA >60 05/16/2019 0335    No results found for: "SPEP", "UPEP"  Lab Results  Component Value Date   WBC 4.2 01/23/2023   NEUTROABS 3.1 01/23/2023   HGB 10.5 (L) 01/23/2023   HCT 32.2 (L) 01/23/2023   MCV 87.3 01/23/2023   PLT 288 01/23/2023      Chemistry      Component Value Date/Time   NA 137 01/23/2023 0811   NA 141 02/25/2019 1222   K 4.1 01/23/2023 0811   CL 104 01/23/2023 0811   CO2 26 01/23/2023 0811   BUN 28 (H) 01/23/2023 0811   BUN 17 02/25/2019 1222   CREATININE 1.35 (H) 01/23/2023 0811      Component Value Date/Time   CALCIUM 9.1 01/23/2023 0811   ALKPHOS 57 01/23/2023 0811   AST 19 01/23/2023  0811   ALT 18 01/23/2023 0811   BILITOT 0.3 01/23/2023 0811       RADIOGRAPHIC STUDIES: I have personally reviewed the radiological images as listed and agreed with the findings in the report. CT CHEST ABDOMEN PELVIS W CONTRAST  Result Date: 01/22/2023 CLINICAL DATA:  Follow-up metastatic endometrial carcinoma. Undergoing chemotherapy. * Tracking Code: BO * EXAM: CT CHEST, ABDOMEN, AND PELVIS WITH CONTRAST TECHNIQUE: Multidetector CT imaging of the chest, abdomen and pelvis was performed following the standard protocol during bolus administration of intravenous contrast. RADIATION DOSE REDUCTION: This exam was performed according to the departmental dose-optimization program which includes automated exposure control, adjustment of the mA and/or kV according to patient size and/or use of iterative reconstruction technique. CONTRAST:  80mL OMNIPAQUE IOHEXOL 300 MG/ML  SOLN COMPARISON:  10/24/2022 FINDINGS: CT CHEST FINDINGS Cardiovascular: No acute findings. Mediastinum/Lymph Nodes: Mediastinal and left hilar lymphadenopathy is decreased since previous study. Precarinal lymph node currently measures 11 mm on image 26/2 compared to 15 mm previously. Previously seen left hilar lymphadenopathy is no longer visualized. No new or increased sites of lymphadenopathy identified. Lungs/Pleura: Previously seen pulmonary nodule in the central left upper lobe has resolved since prior study. Other nodule in the superior segment of the right lower lobe has also resolved. No suspicious pulmonary nodules or masses identified. No evidence of infiltrate or pleural effusion. Musculoskeletal:  No suspicious bone lesions identified. CT ABDOMEN AND PELVIS FINDINGS Hepatobiliary:  No suspicious liver lesions seen on today's exam. Previously suspected small lesion in the liver dome is no longer visualized. Gallbladder is unremarkable. No evidence of biliary ductal dilatation. Pancreas:  No mass or inflammatory changes. Spleen:   Within normal limits in size and appearance. Adrenals/Urinary tract: No suspicious masses or hydronephrosis. Chronic right renal atrophy remains stable. Stomach/Bowel: No evidence of obstruction, inflammatory process, or abnormal fluid collections. Vascular/Lymphatic: No pathologically enlarged lymph nodes identified. No acute vascular findings. Aortic atherosclerotic calcification incidentally noted. Reproductive: Prior hysterectomy noted. Vaginal cuff is unremarkable in appearance. No pelvic mass identified. Adnexal regions are unremarkable in appearance. Other:  None. Musculoskeletal:  No suspicious bone lesions identified. IMPRESSION: Resolution of pulmonary metastases since prior exam. Interval decrease in mediastinal and left hilar lymphadenopathy. Previously suspected small liver lesion and other subtle areas of soft tissue nodularity in the abdomen and pelvis are no longer visualized. No new or progressive disease. 1 Aortic Atherosclerosis (ICD10-I70.0). Electronically Signed   By: Danae Orleans M.D.   On: 01/22/2023 15:27

## 2023-01-23 NOTE — Assessment & Plan Note (Signed)
She has poorly controlled hypertension and is not taking care of herself I recommend she takes full dose 100 mg losartan and we will monitor her kidney function and potassium level carefully I instructed the patient to hold off spironolactone due to interaction I recommend the patient to reach out to her primary care doctor for blood pressure medication adjustment

## 2023-01-23 NOTE — Assessment & Plan Note (Signed)
She has severe uncontrolled hyperglycemia due to steroid treatment She will receive 1 dose of insulin today 

## 2023-01-23 NOTE — Progress Notes (Signed)
Dr Bertis Ruddy aware of pt bp 190/105, ok to proceed with treatment.

## 2023-01-23 NOTE — Patient Instructions (Signed)
[facility]  Discharge Instructions: Thank you for choosing [facility] to provide your oncology and hematology care.   If you have a lab appointment with the Cancer Center, please go directly to the Cancer Center and check in at the registration area.   Wear comfortable clothing and clothing appropriate for easy access to any Portacath or PICC line.   We strive to give you quality time with your provider. You may need to reschedule your appointment if you arrive late (15 or more minutes).  Arriving late affects you and other patients whose appointments are after yours.  Also, if you miss three or more appointments without notifying the office, you may be dismissed from the clinic at the provider's discretion.      For prescription refill requests, have your pharmacy contact our office and allow 72 hours for refills to be completed.    Today you received the following chemotherapy and/or immunotherapy agent: Jemperli, Paclitaxel, and Carboplatin      To help prevent nausea and vomiting after your treatment, we encourage you to take your nausea medication as directed.  BELOW ARE SYMPTOMS THAT SHOULD BE REPORTED IMMEDIATELY: *FEVER GREATER THAN 100.4 F (38 C) OR HIGHER *CHILLS OR SWEATING *NAUSEA AND VOMITING THAT IS NOT CONTROLLED WITH YOUR NAUSEA MEDICATION *UNUSUAL SHORTNESS OF BREATH *UNUSUAL BRUISING OR BLEEDING *URINARY PROBLEMS (pain or burning when urinating, or frequent urination) *BOWEL PROBLEMS (unusual diarrhea, constipation, pain near the anus) TENDERNESS IN MOUTH AND THROAT WITH OR WITHOUT PRESENCE OF ULCERS (sore throat, sores in mouth, or a toothache) UNUSUAL RASH, SWELLING OR PAIN  UNUSUAL VAGINAL DISCHARGE OR ITCHING   Items with * indicate a potential emergency and should be followed up as soon as possible or go to the Emergency Department if any problems should occur.  Please show the CHEMOTHERAPY ALERT CARD or  IMMUNOTHERAPY ALERT CARD at check-in to the Emergency Department and triage nurse.  Should you have questions after your visit or need to cancel or reschedule your appointment, please contact [facility]  Dept: [phone]  and follow the prompts.  Office hours are 8:00 a.m. to 4:30 p.m. Monday - Friday. Please note that voicemails left after 4:00 p.m. may not be returned until the following business day.  We are closed weekends and major holidays. You have access to a nurse at all times for urgent questions. Please call the main number to the clinic Dept: [phone] and follow the prompts.   For any non-urgent questions, you may also contact your provider using MyChart. We now offer e-Visits for anyone 28 and older to request care online for non-urgent symptoms. For details visit [email or web address].   Also download the MyChart app! Go to the app store, search "MyChart", open the app, select [facility], and log in with your MyChart username and password.

## 2023-01-23 NOTE — Assessment & Plan Note (Signed)
This is stable Her renal function is stable Observe closely 

## 2023-02-09 MED FILL — Fosaprepitant Dimeglumine For IV Infusion 150 MG (Base Eq): INTRAVENOUS | Qty: 5 | Status: AC

## 2023-02-09 MED FILL — Dexamethasone Sodium Phosphate Inj 100 MG/10ML: INTRAMUSCULAR | Qty: 1 | Status: AC

## 2023-02-12 ENCOUNTER — Other Ambulatory Visit: Payer: Self-pay

## 2023-02-12 ENCOUNTER — Inpatient Hospital Stay (HOSPITAL_BASED_OUTPATIENT_CLINIC_OR_DEPARTMENT_OTHER): Payer: Medicare HMO | Admitting: Hematology and Oncology

## 2023-02-12 ENCOUNTER — Inpatient Hospital Stay: Payer: Medicare HMO

## 2023-02-12 ENCOUNTER — Inpatient Hospital Stay: Payer: Medicare HMO | Attending: Hematology and Oncology

## 2023-02-12 ENCOUNTER — Encounter: Payer: Self-pay | Admitting: Hematology and Oncology

## 2023-02-12 VITALS — BP 180/97 | HR 77 | Resp 17

## 2023-02-12 VITALS — BP 172/94 | HR 70 | Resp 18 | Ht 63.0 in | Wt 153.0 lb

## 2023-02-12 DIAGNOSIS — C541 Malignant neoplasm of endometrium: Secondary | ICD-10-CM

## 2023-02-12 DIAGNOSIS — N183 Chronic kidney disease, stage 3 unspecified: Secondary | ICD-10-CM | POA: Diagnosis not present

## 2023-02-12 DIAGNOSIS — Z5111 Encounter for antineoplastic chemotherapy: Secondary | ICD-10-CM | POA: Insufficient documentation

## 2023-02-12 DIAGNOSIS — C7801 Secondary malignant neoplasm of right lung: Secondary | ICD-10-CM

## 2023-02-12 DIAGNOSIS — Z5112 Encounter for antineoplastic immunotherapy: Secondary | ICD-10-CM | POA: Insufficient documentation

## 2023-02-12 DIAGNOSIS — C7802 Secondary malignant neoplasm of left lung: Secondary | ICD-10-CM

## 2023-02-12 DIAGNOSIS — Z79620 Long term (current) use of immunosuppressive biologic: Secondary | ICD-10-CM | POA: Diagnosis not present

## 2023-02-12 DIAGNOSIS — I10 Essential (primary) hypertension: Secondary | ICD-10-CM

## 2023-02-12 LAB — CBC WITH DIFFERENTIAL (CANCER CENTER ONLY)
Abs Immature Granulocytes: 0.05 10*3/uL (ref 0.00–0.07)
Basophils Absolute: 0 10*3/uL (ref 0.0–0.1)
Basophils Relative: 0 %
Eosinophils Absolute: 0 10*3/uL (ref 0.0–0.5)
Eosinophils Relative: 0 %
HCT: 30.3 % — ABNORMAL LOW (ref 36.0–46.0)
Hemoglobin: 10.2 g/dL — ABNORMAL LOW (ref 12.0–15.0)
Immature Granulocytes: 1 %
Lymphocytes Relative: 17 %
Lymphs Abs: 1.4 10*3/uL (ref 0.7–4.0)
MCH: 29.1 pg (ref 26.0–34.0)
MCHC: 33.7 g/dL (ref 30.0–36.0)
MCV: 86.6 fL (ref 80.0–100.0)
Monocytes Absolute: 0.2 10*3/uL (ref 0.1–1.0)
Monocytes Relative: 3 %
Neutro Abs: 6.7 10*3/uL (ref 1.7–7.7)
Neutrophils Relative %: 79 %
Platelet Count: 193 10*3/uL (ref 150–400)
RBC: 3.5 MIL/uL — ABNORMAL LOW (ref 3.87–5.11)
RDW: 18.3 % — ABNORMAL HIGH (ref 11.5–15.5)
WBC Count: 8.4 10*3/uL (ref 4.0–10.5)
nRBC: 0 % (ref 0.0–0.2)

## 2023-02-12 LAB — CMP (CANCER CENTER ONLY)
ALT: 11 U/L (ref 0–44)
AST: 13 U/L — ABNORMAL LOW (ref 15–41)
Albumin: 3.6 g/dL (ref 3.5–5.0)
Alkaline Phosphatase: 51 U/L (ref 38–126)
Anion gap: 9 (ref 5–15)
BUN: 32 mg/dL — ABNORMAL HIGH (ref 6–20)
CO2: 24 mmol/L (ref 22–32)
Calcium: 8.7 mg/dL — ABNORMAL LOW (ref 8.9–10.3)
Chloride: 103 mmol/L (ref 98–111)
Creatinine: 1.25 mg/dL — ABNORMAL HIGH (ref 0.44–1.00)
GFR, Estimated: 50 mL/min — ABNORMAL LOW (ref >60)
Glucose, Bld: 204 mg/dL — ABNORMAL HIGH (ref 70–99)
Potassium: 4.7 mmol/L (ref 3.5–5.1)
Sodium: 136 mmol/L (ref 135–145)
Total Bilirubin: 0.3 mg/dL (ref 0.3–1.2)
Total Protein: 8 g/dL (ref 6.5–8.1)

## 2023-02-12 MED ORDER — METOPROLOL SUCCINATE ER 50 MG PO TB24: 50.0000 mg | ORAL_TABLET | Freq: Every day | ORAL | 3 refills | Status: DC

## 2023-02-12 MED ORDER — SODIUM CHLORIDE 0.9 % IV SOLN
131.2500 mg/m2 | Freq: Once | INTRAVENOUS | Status: AC
  Administered 2023-02-12: 228 mg via INTRAVENOUS
  Filled 2023-02-12: qty 38

## 2023-02-12 MED ORDER — SODIUM CHLORIDE 0.9 % IV SOLN: Freq: Once | INTRAVENOUS | Status: AC

## 2023-02-12 MED ORDER — SODIUM CHLORIDE 0.9 % IV SOLN
500.0000 mg | Freq: Once | INTRAVENOUS | Status: AC
  Administered 2023-02-12: 500 mg via INTRAVENOUS
  Filled 2023-02-12: qty 10

## 2023-02-12 MED ORDER — FAMOTIDINE IN NACL 20-0.9 MG/50ML-% IV SOLN
20.0000 mg | Freq: Once | INTRAVENOUS | Status: AC
  Administered 2023-02-12: 20 mg via INTRAVENOUS
  Filled 2023-02-12: qty 50

## 2023-02-12 MED ORDER — HEPARIN SOD (PORK) LOCK FLUSH 100 UNIT/ML IV SOLN
500.0000 [IU] | Freq: Once | INTRAVENOUS | Status: AC | PRN
  Administered 2023-02-12: 500 [IU]

## 2023-02-12 MED ORDER — SODIUM CHLORIDE 0.9% FLUSH
10.0000 mL | INTRAVENOUS | Status: DC | PRN
  Administered 2023-02-12: 10 mL

## 2023-02-12 MED ORDER — PALONOSETRON HCL INJECTION 0.25 MG/5ML
0.2500 mg | Freq: Once | INTRAVENOUS | Status: AC
  Administered 2023-02-12: 0.25 mg via INTRAVENOUS
  Filled 2023-02-12: qty 5

## 2023-02-12 MED ORDER — CETIRIZINE HCL 10 MG/ML IV SOLN
10.0000 mg | Freq: Once | INTRAVENOUS | Status: AC
  Administered 2023-02-12: 10 mg via INTRAVENOUS
  Filled 2023-02-12: qty 1

## 2023-02-12 MED ORDER — SODIUM CHLORIDE 0.9 % IV SOLN
378.0000 mg | Freq: Once | INTRAVENOUS | Status: AC
  Administered 2023-02-12: 380 mg via INTRAVENOUS
  Filled 2023-02-12: qty 38

## 2023-02-12 MED ORDER — SODIUM CHLORIDE 0.9 % IV SOLN
10.0000 mg | Freq: Once | INTRAVENOUS | Status: AC
  Administered 2023-02-12: 10 mg via INTRAVENOUS
  Filled 2023-02-12: qty 10

## 2023-02-12 MED ORDER — SODIUM CHLORIDE 0.9 % IV SOLN
150.0000 mg | Freq: Once | INTRAVENOUS | Status: AC
  Administered 2023-02-12: 150 mg via INTRAVENOUS
  Filled 2023-02-12: qty 150

## 2023-02-12 NOTE — Progress Notes (Signed)
[facility] OFFICE PROGRESS NOTE  Patient Care Team: Verlon Au, MD as PCP - General (Family Medicine) Runell Gess, MD as PCP - Cardiology (Cardiology) Artis Delay, MD as Consulting Physician (Hematology and Oncology)  ASSESSMENT & PLAN:  Endometrial cancer [facility]) Her last CT imaging showed positive response to therapy with resolution of pulmonary metastasis The plan will be to continue treatment for 3 more months We discussed importance of blood pressure and blood sugar control while on treatment Her next imaging study would be in September  CKD (chronic kidney disease), stage III ([name]) She has intermittent acute on chronic renal failure We discussed importance of hydration and risk factor modification She can proceed as long as creatinine is less than 2  Essential hypertension Her blood pressure is better but still quite elevated Recommend increasing metoprolol to 50 mg daily  No orders of the defined types were placed in this encounter.   All questions were answered. The patient knows to call the clinic with any problems, questions or concerns. The total time spent in the appointment was 20 minutes encounter with patients including review of chart and various tests results, discussions about plan of care and coordination of care plan   Artis Delay, MD 02/12/2023 9:56 AM  INTERVAL HISTORY: Please see below for problem oriented charting. she returns for treatment follow-up She tolerated last cycle therapy well Her blood pressure is slightly better when she is compliant taking her medications as directed Denies peripheral neuropathy Denies recent nausea or constipation  REVIEW OF SYSTEMS:   Constitutional: Denies fevers, chills or abnormal weight loss Eyes: Denies blurriness of vision Ears, nose, mouth, throat, and face: Denies mucositis or sore throat Respiratory: Denies cough, dyspnea or wheezes Cardiovascular: Denies palpitation, chest  discomfort or lower extremity swelling Gastrointestinal:  Denies nausea, heartburn or change in bowel habits Skin: Denies abnormal skin rashes Lymphatics: Denies new lymphadenopathy or easy bruising Neurological:Denies numbness, tingling or new weaknesses Behavioral/Psych: Mood is stable, no new changes  All other systems were reviewed with the patient and are negative.  I have reviewed the past medical history, past surgical history, social history and family history with the patient and they are unchanged from previous note.  ALLERGIES:  is allergic to statins, agrostis alba pollen extract [gramineae pollens], and zetia [ezetimibe].  MEDICATIONS:  Current Outpatient Medications  Medication Sig Dispense Refill   acetaminophen (TYLENOL) 500 MG tablet Take 1,000 mg by mouth every 6 (six) hours as needed for mild pain or headache.     aspirin EC 81 MG tablet Take 1 tablet (81 mg total) by mouth daily. Swallow whole.     dexamethasone (DECADRON) 4 MG tablet Take 2 tabs at the night before chemotherapy, every 3 weeks, by mouth x 6 cycles 12 tablet 6   Evolocumab (REPATHA SURECLICK) 140 MG/ML SOAJ Inject 140 mg into the skin every 14 (fourteen) days. 2 pen 11   lidocaine-prilocaine (EMLA) cream Apply 1 application topically as needed. 30 g 0   loratadine (CLARITIN) 10 MG tablet Take 1 tablet (10 mg total) by mouth daily as needed for allergies. 30 tablet 11   losartan (COZAAR) 100 MG tablet Take 1 tablet (100 mg total) by mouth daily. 30 tablet 1   metFORMIN (GLUCOPHAGE) 1000 MG tablet Take 1,000 mg by mouth 2 (two) times daily with a meal.     metoprolol succinate (TOPROL XL) 50 MG 24 hr tablet Take 1 tablet (50 mg total) by mouth daily. 30 tablet 3  nitroGLYCERIN (NITROSTAT) 0.4 MG SL tablet Place 1 tablet (0.4 mg total) under the tongue every 5 (five) minutes as needed for chest pain. 30 tablet 2   ondansetron (ZOFRAN) 8 MG tablet Take 1 tablet (8 mg total) by mouth every 8 (eight) hours as  needed for nausea. 30 tablet 3   polyethylene glycol (MIRALAX) 17 g packet Take 17 g by mouth daily. 30 each 1   prochlorperazine (COMPAZINE) 10 MG tablet Take 1 tablet (10 mg total) by mouth every 6 (six) hours as needed for nausea or vomiting. 30 tablet 0   senna-docusate (SENOKOT-S) 8.6-50 MG tablet Take 2 tablets by mouth 2 (two) times daily. 90 tablet 1   No current facility-administered medications for this visit.   Facility-Administered Medications Ordered in Other Visits  Medication Dose Route Frequency Provider Last Rate Last Admin   CARBOplatin (PARAPLATIN) 380 mg in sodium chloride 0.9 % 100 mL chemo infusion  380 mg Intravenous Once Bertis Ruddy, Jammie Troup, MD       dostarlimab-gxly (JEMPERLI) 500 mg in sodium chloride 0.9 % 100 mL (4.5455 mg/mL) chemo infusion  500 mg Intravenous Once Bertis Ruddy, Varnell Orvis, MD       heparin lock flush 100 unit/mL  500 Units Intracatheter Once PRN Bertis Ruddy, Shwanda Soltis, MD       PACLitaxel (TAXOL) 228 mg in sodium chloride 0.9 % 250 mL chemo infusion (> 80mg /m2)  131.25 mg/m2 (Treatment Plan Recorded) Intravenous Once Athel Merriweather, MD       sodium chloride flush (NS) 0.9 % injection 10 mL  10 mL Intracatheter PRN Artis Delay, MD        SUMMARY OF ONCOLOGIC HISTORY: Oncology History Overview Note  High grade serous MSI stable, Her2/neu 3+, 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    12/14/2020 Imaging   CT chest abdomen and pelvis IMPRESSION:  1.  2 small nodules in the right lung, nonspecific.  2.  Prominent precarinal lymph node versus adjacent small nodes.  3.  Enlarged heterogeneous uterine fundus.  4.  No definite adenopathy in the abdomen or pelvis. Nonspecific small lymph nodes are present, not enlarged by CT criteria.  5.  Mild wall thickening of the anterior aspect of the urinary bladder which may be due to to nondistention or  inflammation.    12/27/2020 Pathology Results   SPECIMEN     Procedure:    Total hysterectomy and bilateral salpingo-oophorectomy   TUMOR     Histologic Type:    Serous carcinoma     Histologic Grade:    Not applicable     Myometrial Invasion:    Present       Depth of Myometrial Invasion:    11 mm       Myometrial Thickness:    12 mm       Percentage of Myometrial Invasion:    92 %     Uterine Serosa Involvement:    Not identified     Cervical Stromal Involvement:    Not identified     Other Tissue / Organ Involvement:    Not identified     Peritoneal / Ascitic Fluid:    Not identified     Lymphovascular Invasion (LVI):    Present   MARGINS     Margin Status:    Not applicable   REGIONAL LYMPH NODES     Regional Lymph  Node Status:           :    All regional lymph nodes negative for tumor cells         Lymph Nodes Examined:               Total Number of Pelvic Nodes Examined:    3           Number of Pelvic Sentinel Nodes Examined:    3           Total Number of Para-aortic Nodes Examined:    0           Number of Para-aortic Sentinel Nodes Examined:    Not applicable   DISTANT METASTASIS     Distant Site(s) Involved:    Not applicable   PATHOLOGIC STAGE CLASSIFICATION (pTNM, AJCC 8th Edition)     Reporting of pT, pN, and (when applicable) pM categories is based on information available to the pathologist at the time the report is issued. As per the AJCC (Chapter 1, 8th Ed.) it is the managing physician's responsibility to establish the final pathologic stage based upon all pertinent information, including but potentially not limited to this pathology report.     TNM Descriptors:    Not applicable     Tumor Modifier:    Not applicable     pT Category:    pT1b     Regional Lymph Nodes Modifier:    (sn)     pN Category:    pN0   The endometrial adenocarcinoma was analyzed by IHC for DNA mismatch repair proteins.  The neoplasm retained nuclear expression of all four mismatch  repair proteins, MLH1, PMS2, MSH2, MSH6.    Estrogen receptors (ER): Negative Progesterone receptors (PR): Negative  Controls worked appropriately.   A HER2 stain is POSITIVE (3+).   12/27/2020 Surgery   At [facility], she underwent a robotic hysterectomy/BSO, sentinel node dissection and partial omentectomy. She was found to have an enlarged uterus. An enlarged right pelvic sentinel node.    02/23/2021 Echocardiogram   Left Ventricle: Systolic function is normal. EF: 55-60%.    Left Ventricle: Doppler parameters consistent with mild diastolic  dysfunction and low to normal LA pressure.    Left Ventricle: There is mild concentric hypertrophy.    Aortic Valve: Mild aortic valve regurgitation.   01/27/2022 Imaging   CT chest imaging 1. No pulmonary embolus. 2. Findings consistent with intrathoracic malignancy. Multiple pulmonary nodules throughout both lungs of varying sizes, consistent with metastatic disease. Primary site of malignancy may represent a 4 cm spiculated nodule in the right perihilar region spanning the fissure that is cavitating. Alternatively all of these nodules may be metastatic given history of cancer, type not specified.  3. Multifocal mediastinal and bilateral hilar adenopathy, consistent metastatic disease. 4. Nondisplaced left anterior seventh and eighth rib fractures, soft tissue thickening adjacent to the seventh rib fracture suggests this may be pathologic 5. Right hydronephrosis is partially included in the field of view in the upper abdomen. Small soft tissue density medial to the right hepatic lobe posterior to the right kidney is nonspecific but may represent metastatic disease. Recommend staging CT of the abdomen and pelvis with oral and IV contrast. 6. Moderate emphysema. 7. Aortic atherosclerosis.  Coronary artery calcifications.   01/28/2022 Initial Diagnosis   Endometrial cancer ([name])   01/28/2022 Cancer Staging   Staging form: Corpus Uteri - Carcinoma and  Carcinosarcoma, AJCC 8th Edition - Clinical stage from 01/28/2022: FIGO Stage  IVB (rcT1b, cN2a, cM1) - Signed by Artis Delay, MD on 01/28/2022 Stage prefix: Recurrence   01/28/2022 Imaging   CT abdomen and pelvis 1. Multiple abdominal and pelvic metastatic implants as above. 2. Moderate right hydronephrosis secondary to mass effect and obstruction of the distal right ureter by a right pelvic metastatic implant. 3. Multiple pulmonary metastatic disease in the visualized right lung base. 4. No bowel obstruction. Normal appendix. 5. Aortic Atherosclerosis (ICD10-I70.0).   01/30/2022 Echocardiogram    1. Left ventricular ejection fraction, by estimation, is 55 to 60%. The left ventricle has normal function. The left ventricle has no regional wall motion abnormalities. There is moderate concentric left ventricular  hypertrophy. Left ventricular diastolic parameters are consistent with Grade I diastolic dysfunction (impaired relaxation).   2. Right ventricular systolic function is normal. The right ventricular size is normal. There is normal pulmonary artery systolic pressure.   3. Left atrial size was mildly dilated.   4. The mitral valve is normal in structure. Trivial mitral valve regurgitation. No evidence of mitral stenosis.   5. The aortic valve is tricuspid. Aortic valve regurgitation is trivial. No aortic stenosis is present.   6. Aortic dilatation noted. There is mild dilatation of the aortic root, measuring 39 mm.   7. The inferior vena cava is normal in size with <50% respiratory variability, suggesting right atrial pressure of 8 mmHg.    01/31/2022 - 04/04/2022 Chemotherapy   Patient is on Treatment Plan : UTERINE SEROUS CARCINOMA Carboplatin + Paclitaxel + Trastuzumab q21d x 6 Cycles / Trastuzumab q21d     01/31/2022 - 05/18/2022 Chemotherapy   Patient is on Treatment Plan : UTERINE SEROUS CARCINOMA Carboplatin + Paclitaxel + Trastuzumab q21d x 6 Cycles / Trastuzumab q21d     04/03/2022  Imaging   1. Pulmonary nodules are either reduced in size or completely resolved. No new nodules. 2. Marked reduction in size peritoneal nodular metastasis. Multiple nodular lesions are no longer measurable. 3. Interval reduction in size of implant in the RIGHT operator space. While this implant is smaller, lesion continues to obstruct the RIGHT ureter with persistent RIGHT hydronephrosis and hydroureter. 4. No new metastatic lesions are present.   05/09/2022 Echocardiogram    1. Left ventricular ejection fraction, by estimation, is 45 to 50%. The left ventricle has mildly decreased function. The left ventricle demonstrates global hypokinesis. There is moderate concentric left ventricular hypertrophy. Left ventricular diastolic parameters are consistent with Grade I diastolic dysfunction (impaired relaxation). The average left ventricular global longitudinal strain is -10.8 %. The global longitudinal strain is abnormal.  2. Right ventricular systolic function is normal. The right ventricular size is normal.  3. The mitral valve is normal in structure. No evidence of mitral valve regurgitation.  4. The aortic valve is tricuspid. Aortic valve regurgitation is mild.  5. There is mild dilatation of the ascending aorta, measuring 39 mm.  6. The inferior vena cava is normal in size with <50% respiratory variability, suggesting right atrial pressure of 8 mmHg.   07/24/2022 Imaging   1. Improving appearance of sequela of metastatic disease. RIGHT pelvic sidewall mass obstructing the RIGHT ureter is no longer clearly seen. LEFT retroperitoneal soft tissue has diminished. 2. No signs of pulmonary metastatic disease or metastatic disease to the chest. 3. Mild RIGHT hydroureter without hydronephrosis. Resolution of hydronephrosis but with further atrophy of the RIGHT kidney. 4. Aortic atherosclerosis and 3 vessel coronary artery disease.   Aortic Atherosclerosis (ICD10-I70.0).   10/25/2022 Imaging   1. New  pathologically enlarged mediastinal and left hilar lymph nodes as well as redemonstration previously treated/resolved pulmonary nodules, consistent with thoracic metastatic disease. Consider further evaluation by nuclear medicine PET-CT. 2. New subtle 14 mm hypodense lesion in the hepatic dome with increased conspicuity of a previously treated hepatic metastatic lesion in the right lobe of the liver, suspicious for new/recurrent hepatic metastatic disease. 3. Interval increase in the soft tissue thickening adjacent to loops of bowel in the right lower quadrant near the right external iliac artery, suspicious for recurrent disease. 4. Slight interval decrease in size of the left retroperitoneal soft tissue nodule. 5. Small focus of soft tissue nodularity along the left vaginal cuff is similar prior. 6.  Aortic Atherosclerosis (ICD10-I70.0).   11/06/2022 -  Chemotherapy   Patient is on Treatment Plan : UTERINE ENDOMETRIAL Dostarlimab-gxly (500 mg) + Carboplatin (AUC 5) + Paclitaxel (175 mg/m2) q21d x 6 cycles / Dostarlimab-gxly (1000 mg) q42d x 6 cycles      01/23/2023 Imaging   CT CHEST ABDOMEN PELVIS W CONTRAST  Result Date: 01/22/2023 CLINICAL DATA:  Follow-up metastatic endometrial carcinoma. Undergoing chemotherapy. * Tracking Code: BO * EXAM: CT CHEST, ABDOMEN, AND PELVIS WITH CONTRAST TECHNIQUE: Multidetector CT imaging of the chest, abdomen and pelvis was performed following the standard protocol during bolus administration of intravenous contrast. RADIATION DOSE REDUCTION: This exam was performed according to the departmental dose-optimization program which includes automated exposure control, adjustment of the mA and/or kV according to patient size and/or use of iterative reconstruction technique. CONTRAST:  80mL OMNIPAQUE IOHEXOL 300 MG/ML  SOLN COMPARISON:  10/24/2022 FINDINGS: CT CHEST FINDINGS Cardiovascular: No acute findings. Mediastinum/Lymph Nodes: Mediastinal and left hilar  lymphadenopathy is decreased since previous study. Precarinal lymph node currently measures 11 mm on image 26/2 compared to 15 mm previously. Previously seen left hilar lymphadenopathy is no longer visualized. No new or increased sites of lymphadenopathy identified. Lungs/Pleura: Previously seen pulmonary nodule in the central left upper lobe has resolved since prior study. Other nodule in the superior segment of the right lower lobe has also resolved. No suspicious pulmonary nodules or masses identified. No evidence of infiltrate or pleural effusion. Musculoskeletal:  No suspicious bone lesions identified. CT ABDOMEN AND PELVIS FINDINGS Hepatobiliary: No suspicious liver lesions seen on today's exam. Previously suspected small lesion in the liver dome is no longer visualized. Gallbladder is unremarkable. No evidence of biliary ductal dilatation. Pancreas:  No mass or inflammatory changes. Spleen:  Within normal limits in size and appearance. Adrenals/Urinary tract: No suspicious masses or hydronephrosis. Chronic right renal atrophy remains stable. Stomach/Bowel: No evidence of obstruction, inflammatory process, or abnormal fluid collections. Vascular/Lymphatic: No pathologically enlarged lymph nodes identified. No acute vascular findings. Aortic atherosclerotic calcification incidentally noted. Reproductive: Prior hysterectomy noted. Vaginal cuff is unremarkable in appearance. No pelvic mass identified. Adnexal regions are unremarkable in appearance. Other:  None. Musculoskeletal:  No suspicious bone lesions identified. IMPRESSION: Resolution of pulmonary metastases since prior exam. Interval decrease in mediastinal and left hilar lymphadenopathy. Previously suspected small liver lesion and other subtle areas of soft tissue nodularity in the abdomen and pelvis are no longer visualized. No new or progressive disease. 1 Aortic Atherosclerosis (ICD10-I70.0). Electronically Signed   By: Danae Orleans M.D.   On:  01/22/2023 15:27      Metastasis to lung ([name])  01/28/2022 Initial Diagnosis   Metastasis to lung ([name])   11/06/2022 -  Chemotherapy   Patient is on Treatment Plan : UTERINE ENDOMETRIAL Dostarlimab-gxly (500 mg) + Carboplatin (AUC 5) +  Paclitaxel (175 mg/m2) q21d x 6 cycles / Dostarlimab-gxly (1000 mg) q42d x 6 cycles        PHYSICAL EXAMINATION: ECOG PERFORMANCE STATUS: 0 - Asymptomatic  Vitals:   02/12/23 0829  BP: (!) 172/94  Pulse: 70  Resp: 18  SpO2: 100%   Filed Weights   02/12/23 0829  Weight: 153 lb (69.4 kg)    GENERAL:alert, no distress and comfortable  NEURO: alert & oriented x 3 with fluent speech, no focal motor/sensory deficits  LABORATORY DATA:  I have reviewed the data as listed    Component Value Date/Time   NA 136 02/12/2023 0808   NA 141 02/25/2019 1222   K 4.7 02/12/2023 0808   CL 103 02/12/2023 0808   CO2 24 02/12/2023 0808   GLUCOSE 204 (H) 02/12/2023 0808   BUN 32 (H) 02/12/2023 0808   BUN 17 02/25/2019 1222   CREATININE 1.25 (H) 02/12/2023 0808   CALCIUM 8.7 (L) 02/12/2023 0808   PROT 8.0 02/12/2023 0808   PROT 6.9 09/22/2019 1419   ALBUMIN 3.6 02/12/2023 0808   ALBUMIN 3.9 09/22/2019 1419   AST 13 (L) 02/12/2023 0808   ALT 11 02/12/2023 0808   ALKPHOS 51 02/12/2023 0808   BILITOT 0.3 02/12/2023 0808   GFRNONAA 50 (L) 02/12/2023 0808   GFRAA >60 05/16/2019 0335    No results found for: "SPEP", "UPEP"  Lab Results  Component Value Date   WBC 8.4 02/12/2023   NEUTROABS 6.7 02/12/2023   HGB 10.2 (L) 02/12/2023   HCT 30.3 (L) 02/12/2023   MCV 86.6 02/12/2023   PLT 193 02/12/2023      Chemistry      Component Value Date/Time   NA 136 02/12/2023 0808   NA 141 02/25/2019 1222   K 4.7 02/12/2023 0808   CL 103 02/12/2023 0808   CO2 24 02/12/2023 0808   BUN 32 (H) 02/12/2023 0808   BUN 17 02/25/2019 1222   CREATININE 1.25 (H) 02/12/2023 0808      Component Value Date/Time   CALCIUM 8.7 (L) 02/12/2023 0808   ALKPHOS 51  02/12/2023 0808   AST 13 (L) 02/12/2023 0808   ALT 11 02/12/2023 0808   BILITOT 0.3 02/12/2023 0808       RADIOGRAPHIC STUDIES: I have personally reviewed the radiological images as listed and agreed with the findings in the report. CT CHEST ABDOMEN PELVIS W CONTRAST  Result Date: 01/22/2023 CLINICAL DATA:  Follow-up metastatic endometrial carcinoma. Undergoing chemotherapy. * Tracking Code: BO * EXAM: CT CHEST, ABDOMEN, AND PELVIS WITH CONTRAST TECHNIQUE: Multidetector CT imaging of the chest, abdomen and pelvis was performed following the standard protocol during bolus administration of intravenous contrast. RADIATION DOSE REDUCTION: This exam was performed according to the departmental dose-optimization program which includes automated exposure control, adjustment of the mA and/or kV according to patient size and/or use of iterative reconstruction technique. CONTRAST:  80mL OMNIPAQUE IOHEXOL 300 MG/ML  SOLN COMPARISON:  10/24/2022 FINDINGS: CT CHEST FINDINGS Cardiovascular: No acute findings. Mediastinum/Lymph Nodes: Mediastinal and left hilar lymphadenopathy is decreased since previous study. Precarinal lymph node currently measures 11 mm on image 26/2 compared to 15 mm previously. Previously seen left hilar lymphadenopathy is no longer visualized. No new or increased sites of lymphadenopathy identified. Lungs/Pleura: Previously seen pulmonary nodule in the central left upper lobe has resolved since prior study. Other nodule in the superior segment of the right lower lobe has also resolved. No suspicious pulmonary nodules or masses identified. No evidence of infiltrate or  pleural effusion. Musculoskeletal:  No suspicious bone lesions identified. CT ABDOMEN AND PELVIS FINDINGS Hepatobiliary: No suspicious liver lesions seen on today's exam. Previously suspected small lesion in the liver dome is no longer visualized. Gallbladder is unremarkable. No evidence of biliary ductal dilatation. Pancreas:  No  mass or inflammatory changes. Spleen:  Within normal limits in size and appearance. Adrenals/Urinary tract: No suspicious masses or hydronephrosis. Chronic right renal atrophy remains stable. Stomach/Bowel: No evidence of obstruction, inflammatory process, or abnormal fluid collections. Vascular/Lymphatic: No pathologically enlarged lymph nodes identified. No acute vascular findings. Aortic atherosclerotic calcification incidentally noted. Reproductive: Prior hysterectomy noted. Vaginal cuff is unremarkable in appearance. No pelvic mass identified. Adnexal regions are unremarkable in appearance. Other:  None. Musculoskeletal:  No suspicious bone lesions identified. IMPRESSION: Resolution of pulmonary metastases since prior exam. Interval decrease in mediastinal and left hilar lymphadenopathy. Previously suspected small liver lesion and other subtle areas of soft tissue nodularity in the abdomen and pelvis are no longer visualized. No new or progressive disease. 1 Aortic Atherosclerosis (ICD10-I70.0). Electronically Signed   By: Danae Orleans M.D.   On: 01/22/2023 15:27

## 2023-02-12 NOTE — Assessment & Plan Note (Signed)
Her blood pressure is better but still quite elevated Recommend increasing metoprolol to 50 mg daily

## 2023-02-12 NOTE — Assessment & Plan Note (Signed)
She has intermittent acute on chronic renal failure We discussed importance of hydration and risk factor modification She can proceed as long as creatinine is less than 2 

## 2023-02-12 NOTE — Assessment & Plan Note (Signed)
Her last CT imaging showed positive response to therapy with resolution of pulmonary metastasis The plan will be to continue treatment for 3 more months We discussed importance of blood pressure and blood sugar control while on treatment Her next imaging study would be in September

## 2023-03-05 MED FILL — Fosaprepitant Dimeglumine For IV Infusion 150 MG (Base Eq): INTRAVENOUS | Qty: 5 | Status: AC

## 2023-03-05 MED FILL — Dexamethasone Sodium Phosphate Inj 100 MG/10ML: INTRAMUSCULAR | Qty: 1 | Status: AC

## 2023-03-06 ENCOUNTER — Inpatient Hospital Stay: Payer: Medicare HMO

## 2023-03-06 ENCOUNTER — Other Ambulatory Visit: Payer: Self-pay

## 2023-03-06 ENCOUNTER — Inpatient Hospital Stay (HOSPITAL_BASED_OUTPATIENT_CLINIC_OR_DEPARTMENT_OTHER): Payer: Medicare HMO | Admitting: Hematology and Oncology

## 2023-03-06 ENCOUNTER — Encounter: Payer: Self-pay | Admitting: Hematology and Oncology

## 2023-03-06 VITALS — BP 156/91 | HR 69 | Temp 97.4°F | Resp 18 | Ht 63.0 in | Wt 147.4 lb

## 2023-03-06 VITALS — BP 182/100 | HR 65

## 2023-03-06 DIAGNOSIS — T451X5A Adverse effect of antineoplastic and immunosuppressive drugs, initial encounter: Secondary | ICD-10-CM

## 2023-03-06 DIAGNOSIS — C7801 Secondary malignant neoplasm of right lung: Secondary | ICD-10-CM

## 2023-03-06 DIAGNOSIS — Z5112 Encounter for antineoplastic immunotherapy: Secondary | ICD-10-CM | POA: Diagnosis not present

## 2023-03-06 DIAGNOSIS — I10 Essential (primary) hypertension: Secondary | ICD-10-CM

## 2023-03-06 DIAGNOSIS — C541 Malignant neoplasm of endometrium: Secondary | ICD-10-CM

## 2023-03-06 DIAGNOSIS — N1330 Unspecified hydronephrosis: Secondary | ICD-10-CM | POA: Diagnosis not present

## 2023-03-06 DIAGNOSIS — C7802 Secondary malignant neoplasm of left lung: Secondary | ICD-10-CM

## 2023-03-06 DIAGNOSIS — D6481 Anemia due to antineoplastic chemotherapy: Secondary | ICD-10-CM | POA: Diagnosis not present

## 2023-03-06 LAB — CMP (CANCER CENTER ONLY)
ALT: 9 U/L (ref 0–44)
AST: 12 U/L — ABNORMAL LOW (ref 15–41)
Albumin: 3.7 g/dL (ref 3.5–5.0)
Alkaline Phosphatase: 47 U/L (ref 38–126)
Anion gap: 6 (ref 5–15)
BUN: 23 mg/dL — ABNORMAL HIGH (ref 6–20)
CO2: 27 mmol/L (ref 22–32)
Calcium: 9.3 mg/dL (ref 8.9–10.3)
Chloride: 105 mmol/L (ref 98–111)
Creatinine: 1.26 mg/dL — ABNORMAL HIGH (ref 0.44–1.00)
GFR, Estimated: 49 mL/min — ABNORMAL LOW (ref >60)
Glucose, Bld: 143 mg/dL — ABNORMAL HIGH (ref 70–99)
Potassium: 4.2 mmol/L (ref 3.5–5.1)
Sodium: 138 mmol/L (ref 135–145)
Total Bilirubin: 0.4 mg/dL (ref 0.3–1.2)
Total Protein: 7.6 g/dL (ref 6.5–8.1)

## 2023-03-06 LAB — CBC WITH DIFFERENTIAL (CANCER CENTER ONLY)
Abs Immature Granulocytes: 0.02 10*3/uL (ref 0.00–0.07)
Basophils Absolute: 0 10*3/uL (ref 0.0–0.1)
Basophils Relative: 1 %
Eosinophils Absolute: 0 10*3/uL (ref 0.0–0.5)
Eosinophils Relative: 1 %
HCT: 29.5 % — ABNORMAL LOW (ref 36.0–46.0)
Hemoglobin: 9.8 g/dL — ABNORMAL LOW (ref 12.0–15.0)
Immature Granulocytes: 0 %
Lymphocytes Relative: 17 %
Lymphs Abs: 1.1 10*3/uL (ref 0.7–4.0)
MCH: 29.2 pg (ref 26.0–34.0)
MCHC: 33.2 g/dL (ref 30.0–36.0)
MCV: 87.8 fL (ref 80.0–100.0)
Monocytes Absolute: 0.2 10*3/uL (ref 0.1–1.0)
Monocytes Relative: 4 %
Neutro Abs: 4.9 10*3/uL (ref 1.7–7.7)
Neutrophils Relative %: 77 %
Platelet Count: 288 10*3/uL (ref 150–400)
RBC: 3.36 MIL/uL — ABNORMAL LOW (ref 3.87–5.11)
RDW: 18.5 % — ABNORMAL HIGH (ref 11.5–15.5)
WBC Count: 6.3 10*3/uL (ref 4.0–10.5)
nRBC: 0 % (ref 0.0–0.2)

## 2023-03-06 LAB — TSH: TSH: 0.349 u[IU]/mL — ABNORMAL LOW (ref 0.350–4.500)

## 2023-03-06 MED ORDER — SODIUM CHLORIDE 0.9% FLUSH
10.0000 mL | Freq: Once | INTRAVENOUS | Status: AC
  Administered 2023-03-06: 10 mL

## 2023-03-06 MED ORDER — SODIUM CHLORIDE 0.9% FLUSH
10.0000 mL | INTRAVENOUS | Status: DC | PRN
  Administered 2023-03-06: 10 mL

## 2023-03-06 MED ORDER — SODIUM CHLORIDE 0.9 % IV SOLN: Freq: Once | INTRAVENOUS | Status: AC

## 2023-03-06 MED ORDER — SODIUM CHLORIDE 0.9 % IV SOLN
500.0000 mg | Freq: Once | INTRAVENOUS | Status: AC
  Administered 2023-03-06: 500 mg via INTRAVENOUS
  Filled 2023-03-06: qty 10

## 2023-03-06 MED ORDER — SODIUM CHLORIDE 0.9 % IV SOLN
10.0000 mg | Freq: Once | INTRAVENOUS | Status: AC
  Administered 2023-03-06: 10 mg via INTRAVENOUS
  Filled 2023-03-06: qty 10

## 2023-03-06 MED ORDER — FAMOTIDINE IN NACL 20-0.9 MG/50ML-% IV SOLN
20.0000 mg | Freq: Once | INTRAVENOUS | Status: AC
  Administered 2023-03-06: 20 mg via INTRAVENOUS
  Filled 2023-03-06: qty 50

## 2023-03-06 MED ORDER — SODIUM CHLORIDE 0.9 % IV SOLN
131.2500 mg/m2 | Freq: Once | INTRAVENOUS | Status: AC
  Administered 2023-03-06: 228 mg via INTRAVENOUS
  Filled 2023-03-06: qty 38

## 2023-03-06 MED ORDER — SODIUM CHLORIDE 0.9 % IV SOLN
150.0000 mg | Freq: Once | INTRAVENOUS | Status: AC
  Administered 2023-03-06: 150 mg via INTRAVENOUS
  Filled 2023-03-06: qty 150

## 2023-03-06 MED ORDER — HEPARIN SOD (PORK) LOCK FLUSH 100 UNIT/ML IV SOLN
500.0000 [IU] | Freq: Once | INTRAVENOUS | Status: AC | PRN
  Administered 2023-03-06: 500 [IU]

## 2023-03-06 MED ORDER — CETIRIZINE HCL 10 MG/ML IV SOLN
10.0000 mg | Freq: Once | INTRAVENOUS | Status: AC
  Administered 2023-03-06: 10 mg via INTRAVENOUS
  Filled 2023-03-06: qty 1

## 2023-03-06 MED ORDER — PALONOSETRON HCL INJECTION 0.25 MG/5ML
0.2500 mg | Freq: Once | INTRAVENOUS | Status: AC
  Administered 2023-03-06: 0.25 mg via INTRAVENOUS
  Filled 2023-03-06: qty 5

## 2023-03-06 MED ORDER — SODIUM CHLORIDE 0.9 % IV SOLN
376.0000 mg | Freq: Once | INTRAVENOUS | Status: AC
  Administered 2023-03-06: 380 mg via INTRAVENOUS
  Filled 2023-03-06: qty 38

## 2023-03-06 NOTE — Patient Instructions (Signed)
[facility]  Discharge Instructions: Thank you for choosing [facility] to provide your oncology and hematology care.   If you have a lab appointment with the Cancer Center, please go directly to the Cancer Center and check in at the registration area.   Wear comfortable clothing and clothing appropriate for easy access to any Portacath or PICC line.   We strive to give you quality time with your provider. You may need to reschedule your appointment if you arrive late (15 or more minutes).  Arriving late affects you and other patients whose appointments are after yours.  Also, if you miss three or more appointments without notifying the office, you may be dismissed from the clinic at the provider's discretion.      For prescription refill requests, have your pharmacy contact our office and allow 72 hours for refills to be completed.    Today you received the following chemotherapy and/or immunotherapy agents :  Dostarlimab & Paclitaxel      To help prevent nausea and vomiting after your treatment, we encourage you to take your nausea medication as directed.  BELOW ARE SYMPTOMS THAT SHOULD BE REPORTED IMMEDIATELY: *FEVER GREATER THAN 100.4 F (38 C) OR HIGHER *CHILLS OR SWEATING *NAUSEA AND VOMITING THAT IS NOT CONTROLLED WITH YOUR NAUSEA MEDICATION *UNUSUAL SHORTNESS OF BREATH *UNUSUAL BRUISING OR BLEEDING *URINARY PROBLEMS (pain or burning when urinating, or frequent urination) *BOWEL PROBLEMS (unusual diarrhea, constipation, pain near the anus) TENDERNESS IN MOUTH AND THROAT WITH OR WITHOUT PRESENCE OF ULCERS (sore throat, sores in mouth, or a toothache) UNUSUAL RASH, SWELLING OR PAIN  UNUSUAL VAGINAL DISCHARGE OR ITCHING   Items with * indicate a potential emergency and should be followed up as soon as possible or go to the Emergency Department if any problems should occur.  Please show the CHEMOTHERAPY ALERT CARD or IMMUNOTHERAPY  ALERT CARD at check-in to the Emergency Department and triage nurse.  Should you have questions after your visit or need to cancel or reschedule your appointment, please contact [facility]  Dept: [phone]  and follow the prompts.  Office hours are 8:00 a.m. to 4:30 p.m. Monday - Friday. Please note that voicemails left after 4:00 p.m. may not be returned until the following business day.  We are closed weekends and major holidays. You have access to a nurse at all times for urgent questions. Please call the main number to the clinic Dept: [phone] and follow the prompts.   For any non-urgent questions, you may also contact your provider using MyChart. We now offer e-Visits for anyone 18 and older to request care online for non-urgent symptoms. For details visit [email or web address].   Also download the MyChart app! Go to the app store, search "MyChart", open the app, select , and log in with your MyChart username and password.

## 2023-03-06 NOTE — Assessment & Plan Note (Signed)
This is stable Her renal function is stable Observe closely

## 2023-03-06 NOTE — Assessment & Plan Note (Signed)
This is likely due to recent treatment. The patient denies recent history of bleeding such as epistaxis, hematuria or hematochezia. She is asymptomatic from the anemia. I will observe for now.  She does not require transfusion now. I will continue the chemotherapy at current dose without dosage adjustment.  If the anemia gets progressive worse in the future, I might have to delay her treatment or adjust the chemotherapy dose.  

## 2023-03-06 NOTE — Assessment & Plan Note (Signed)
Her last CT imaging showed positive response to therapy with resolution of pulmonary metastasis The plan will be to continue treatment for 3 more months We discussed importance of blood pressure and blood sugar control while on treatment Her next imaging study would be in September

## 2023-03-06 NOTE — Progress Notes (Signed)
[facility] OFFICE PROGRESS NOTE  Patient Care Team: Verlon Au, MD as PCP - General (Family Medicine) Runell Gess, MD as PCP - Cardiology (Cardiology) Artis Delay, MD as Consulting Physician (Hematology and Oncology)  ASSESSMENT & PLAN:  Endometrial cancer [facility]) Her last CT imaging showed positive response to therapy with resolution of pulmonary metastasis The plan will be to continue treatment for 3 more months We discussed importance of blood pressure and blood sugar control while on treatment Her next imaging study would be in September  Anemia due to antineoplastic chemotherapy This is likely due to recent treatment. The patient denies recent history of bleeding such as epistaxis, hematuria or hematochezia. She is asymptomatic from the anemia. I will observe for now.  She does not require transfusion now. I will continue the chemotherapy at current dose without dosage adjustment.  If the anemia gets progressive worse in the future, I might have to delay her treatment or adjust the chemotherapy dose.   Hydronephrosis of right kidney This is stable Her renal function is stable Observe closely  HTN (hypertension) Her blood pressure is better controlled She will continue current antihypertensives  No orders of the defined types were placed in this encounter.   All questions were answered. The patient knows to call the clinic with any problems, questions or concerns. The total time spent in the appointment was 30 minutes encounter with patients including review of chart and various tests results, discussions about plan of care and coordination of care plan   Artis Delay, MD 03/06/2023 10:36 AM  INTERVAL HISTORY: Please see below for problem oriented charting. she returns for treatment follow-up She returns with her mother for further follow-up She is doing well Denies peripheral neuropathy Denies nausea or vomiting She has quit smoking Her blood  pressure at home is better controlled  REVIEW OF SYSTEMS:   Constitutional: Denies fevers, chills or abnormal weight loss Eyes: Denies blurriness of vision Ears, nose, mouth, throat, and face: Denies mucositis or sore throat Respiratory: Denies cough, dyspnea or wheezes Cardiovascular: Denies palpitation, chest discomfort or lower extremity swelling Gastrointestinal:  Denies nausea, heartburn or change in bowel habits Skin: Denies abnormal skin rashes Lymphatics: Denies new lymphadenopathy or easy bruising Neurological:Denies numbness, tingling or new weaknesses Behavioral/Psych: Mood is stable, no new changes  All other systems were reviewed with the patient and are negative.  I have reviewed the past medical history, past surgical history, social history and family history with the patient and they are unchanged from previous note.  ALLERGIES:  is allergic to statins, agrostis alba pollen extract [gramineae pollens], and zetia [ezetimibe].  MEDICATIONS:  Current Outpatient Medications  Medication Sig Dispense Refill   acetaminophen (TYLENOL) 500 MG tablet Take 1,000 mg by mouth every 6 (six) hours as needed for mild pain or headache.     aspirin EC 81 MG tablet Take 1 tablet (81 mg total) by mouth daily. Swallow whole.     dexamethasone (DECADRON) 4 MG tablet Take 2 tabs at the night before chemotherapy, every 3 weeks, by mouth x 6 cycles 12 tablet 6   Evolocumab (REPATHA SURECLICK) 140 MG/ML SOAJ Inject 140 mg into the skin every 14 (fourteen) days. 2 pen 11   lidocaine-prilocaine (EMLA) cream Apply 1 application topically as needed. 30 g 0   loratadine (CLARITIN) 10 MG tablet Take 1 tablet (10 mg total) by mouth daily as needed for allergies. 30 tablet 11   losartan (COZAAR) 100 MG tablet Take 1  tablet (100 mg total) by mouth daily. 30 tablet 1   metFORMIN (GLUCOPHAGE) 1000 MG tablet Take 1,000 mg by mouth 2 (two) times daily with a meal.     metoprolol succinate (TOPROL XL) 50 MG  24 hr tablet Take 1 tablet (50 mg total) by mouth daily. 30 tablet 3   nitroGLYCERIN (NITROSTAT) 0.4 MG SL tablet Place 1 tablet (0.4 mg total) under the tongue every 5 (five) minutes as needed for chest pain. 30 tablet 2   ondansetron (ZOFRAN) 8 MG tablet Take 1 tablet (8 mg total) by mouth every 8 (eight) hours as needed for nausea. 30 tablet 3   polyethylene glycol (MIRALAX) 17 g packet Take 17 g by mouth daily. 30 each 1   prochlorperazine (COMPAZINE) 10 MG tablet Take 1 tablet (10 mg total) by mouth every 6 (six) hours as needed for nausea or vomiting. 30 tablet 0   senna-docusate (SENOKOT-S) 8.6-50 MG tablet Take 2 tablets by mouth 2 (two) times daily. 90 tablet 1   No current facility-administered medications for this visit.   Facility-Administered Medications Ordered in Other Visits  Medication Dose Route Frequency Provider Last Rate Last Admin   CARBOplatin (PARAPLATIN) 380 mg in sodium chloride 0.9 % 100 mL chemo infusion  380 mg Intravenous Once Bertis Ruddy, Sebastien Jackson, MD       dexamethasone (DECADRON) 10 mg in sodium chloride 0.9 % 50 mL IVPB  10 mg Intravenous Once Bertis Ruddy, Amiree No, MD       dostarlimab-gxly (JEMPERLI) 500 mg in sodium chloride 0.9 % 100 mL (4.5455 mg/mL) chemo infusion  500 mg Intravenous Once Bertis Ruddy, Grazia Taffe, MD       famotidine (PEPCID) IVPB 20 mg premix  20 mg Intravenous Once Bertis Ruddy, Ahijah Devery, MD 200 mL/hr at 03/06/23 1026 20 mg at 03/06/23 1026   fosaprepitant (EMEND) 150 mg in sodium chloride 0.9 % 145 mL IVPB  150 mg Intravenous Once Bertis Ruddy, Kaimani Clayson, MD       heparin lock flush 100 unit/mL  500 Units Intracatheter Once PRN Bertis Ruddy, Bayyinah Dukeman, MD       PACLitaxel (TAXOL) 228 mg in sodium chloride 0.9 % 250 mL chemo infusion (> 80mg /m2)  131.25 mg/m2 (Treatment Plan Recorded) Intravenous Once Dimitrious Micciche, MD       sodium chloride flush (NS) 0.9 % injection 10 mL  10 mL Intracatheter PRN Artis Delay, MD        SUMMARY OF ONCOLOGIC HISTORY: Oncology History Overview Note  High grade serous MSI  stable, Her2/neu 3+, 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    12/14/2020 Imaging   CT chest abdomen and pelvis IMPRESSION:  1.  2 small nodules in the right lung, nonspecific.  2.  Prominent precarinal lymph node versus adjacent small nodes.  3.  Enlarged heterogeneous uterine fundus.  4.  No definite adenopathy in the abdomen or pelvis. Nonspecific small lymph nodes are present, not enlarged by CT criteria.  5.  Mild wall thickening of the anterior aspect of the urinary bladder which may be due to to nondistention or inflammation.    12/27/2020 Pathology Results   SPECIMEN     Procedure:    Total hysterectomy and bilateral salpingo-oophorectomy   TUMOR     Histologic Type:    Serous carcinoma     Histologic Grade:    Not applicable     Myometrial Invasion:  Present       Depth of Myometrial Invasion:    11 mm       Myometrial Thickness:    12 mm       Percentage of Myometrial Invasion:    92 %     Uterine Serosa Involvement:    Not identified     Cervical Stromal Involvement:    Not identified     Other Tissue / Organ Involvement:    Not identified     Peritoneal / Ascitic Fluid:    Not identified     Lymphovascular Invasion (LVI):    Present   MARGINS     Margin Status:    Not applicable   REGIONAL LYMPH NODES     Regional Lymph Node Status:           :    All regional lymph nodes negative for tumor cells         Lymph Nodes Examined:               Total Number of Pelvic Nodes Examined:    3           Number of Pelvic Sentinel Nodes Examined:    3           Total Number of Para-aortic Nodes Examined:    0           Number of Para-aortic Sentinel Nodes Examined:    Not applicable   DISTANT METASTASIS     Distant Site(s) Involved:    Not applicable   PATHOLOGIC STAGE CLASSIFICATION (pTNM, AJCC 8th Edition)     Reporting of pT, pN, and  (when applicable) pM categories is based on information available to the pathologist at the time the report is issued. As per the AJCC (Chapter 1, 8th Ed.) it is the managing physician's responsibility to establish the final pathologic stage based upon all pertinent information, including but potentially not limited to this pathology report.     TNM Descriptors:    Not applicable     Tumor Modifier:    Not applicable     pT Category:    pT1b     Regional Lymph Nodes Modifier:    (sn)     pN Category:    pN0   The endometrial adenocarcinoma was analyzed by IHC for DNA mismatch repair proteins.  The neoplasm retained nuclear expression of all four mismatch repair proteins, MLH1, PMS2, MSH2, MSH6.    Estrogen receptors (ER): Negative Progesterone receptors (PR): Negative  Controls worked appropriately.   A HER2 stain is POSITIVE (3+).   12/27/2020 Surgery   At [facility], she underwent a robotic hysterectomy/BSO, sentinel node dissection and partial omentectomy. She was found to have an enlarged uterus. An enlarged right pelvic sentinel node.    02/23/2021 Echocardiogram   Left Ventricle: Systolic function is normal. EF: 55-60%.    Left Ventricle: Doppler parameters consistent with mild diastolic  dysfunction and low to normal LA pressure.    Left Ventricle: There is mild concentric hypertrophy.    Aortic Valve: Mild aortic valve regurgitation.   01/27/2022 Imaging   CT chest imaging 1. No pulmonary embolus. 2. Findings consistent with intrathoracic malignancy. Multiple pulmonary nodules throughout both lungs of varying sizes, consistent with metastatic disease. Primary site of malignancy may represent a 4 cm spiculated nodule in the right perihilar region spanning the fissure that is cavitating. Alternatively all of these nodules may be metastatic given history of cancer, type not  specified.  3. Multifocal mediastinal and bilateral hilar adenopathy, consistent metastatic disease. 4.  Nondisplaced left anterior seventh and eighth rib fractures, soft tissue thickening adjacent to the seventh rib fracture suggests this may be pathologic 5. Right hydronephrosis is partially included in the field of view in the upper abdomen. Small soft tissue density medial to the right hepatic lobe posterior to the right kidney is nonspecific but may represent metastatic disease. Recommend staging CT of the abdomen and pelvis with oral and IV contrast. 6. Moderate emphysema. 7. Aortic atherosclerosis.  Coronary artery calcifications.   01/28/2022 Initial Diagnosis   Endometrial cancer ([name])   01/28/2022 Cancer Staging   Staging form: Corpus Uteri - Carcinoma and Carcinosarcoma, AJCC 8th Edition - Clinical stage from 01/28/2022: FIGO Stage IVB (rcT1b, cN2a, cM1) - Signed by Artis Delay, MD on 01/28/2022 Stage prefix: Recurrence   01/28/2022 Imaging   CT abdomen and pelvis 1. Multiple abdominal and pelvic metastatic implants as above. 2. Moderate right hydronephrosis secondary to mass effect and obstruction of the distal right ureter by a right pelvic metastatic implant. 3. Multiple pulmonary metastatic disease in the visualized right lung base. 4. No bowel obstruction. Normal appendix. 5. Aortic Atherosclerosis (ICD10-I70.0).   01/30/2022 Echocardiogram    1. Left ventricular ejection fraction, by estimation, is 55 to 60%. The left ventricle has normal function. The left ventricle has no regional wall motion abnormalities. There is moderate concentric left ventricular  hypertrophy. Left ventricular diastolic parameters are consistent with Grade I diastolic dysfunction (impaired relaxation).   2. Right ventricular systolic function is normal. The right ventricular size is normal. There is normal pulmonary artery systolic pressure.   3. Left atrial size was mildly dilated.   4. The mitral valve is normal in structure. Trivial mitral valve regurgitation. No evidence of mitral stenosis.   5. The  aortic valve is tricuspid. Aortic valve regurgitation is trivial. No aortic stenosis is present.   6. Aortic dilatation noted. There is mild dilatation of the aortic root, measuring 39 mm.   7. The inferior vena cava is normal in size with <50% respiratory variability, suggesting right atrial pressure of 8 mmHg.    01/31/2022 - 04/04/2022 Chemotherapy   Patient is on Treatment Plan : UTERINE SEROUS CARCINOMA Carboplatin + Paclitaxel + Trastuzumab q21d x 6 Cycles / Trastuzumab q21d     01/31/2022 - 05/18/2022 Chemotherapy   Patient is on Treatment Plan : UTERINE SEROUS CARCINOMA Carboplatin + Paclitaxel + Trastuzumab q21d x 6 Cycles / Trastuzumab q21d     04/03/2022 Imaging   1. Pulmonary nodules are either reduced in size or completely resolved. No new nodules. 2. Marked reduction in size peritoneal nodular metastasis. Multiple nodular lesions are no longer measurable. 3. Interval reduction in size of implant in the RIGHT operator space. While this implant is smaller, lesion continues to obstruct the RIGHT ureter with persistent RIGHT hydronephrosis and hydroureter. 4. No new metastatic lesions are present.   05/09/2022 Echocardiogram    1. Left ventricular ejection fraction, by estimation, is 45 to 50%. The left ventricle has mildly decreased function. The left ventricle demonstrates global hypokinesis. There is moderate concentric left ventricular hypertrophy. Left ventricular diastolic parameters are consistent with Grade I diastolic dysfunction (impaired relaxation). The average left ventricular global longitudinal strain is -10.8 %. The global longitudinal strain is abnormal.  2. Right ventricular systolic function is normal. The right ventricular size is normal.  3. The mitral valve is normal in structure. No evidence of mitral valve regurgitation.  4. The aortic valve is tricuspid. Aortic valve regurgitation is mild.  5. There is mild dilatation of the ascending aorta, measuring 39 mm.  6. The  inferior vena cava is normal in size with <50% respiratory variability, suggesting right atrial pressure of 8 mmHg.   07/24/2022 Imaging   1. Improving appearance of sequela of metastatic disease. RIGHT pelvic sidewall mass obstructing the RIGHT ureter is no longer clearly seen. LEFT retroperitoneal soft tissue has diminished. 2. No signs of pulmonary metastatic disease or metastatic disease to the chest. 3. Mild RIGHT hydroureter without hydronephrosis. Resolution of hydronephrosis but with further atrophy of the RIGHT kidney. 4. Aortic atherosclerosis and 3 vessel coronary artery disease.   Aortic Atherosclerosis (ICD10-I70.0).   10/25/2022 Imaging   1. New pathologically enlarged mediastinal and left hilar lymph nodes as well as redemonstration previously treated/resolved pulmonary nodules, consistent with thoracic metastatic disease. Consider further evaluation by nuclear medicine PET-CT. 2. New subtle 14 mm hypodense lesion in the hepatic dome with increased conspicuity of a previously treated hepatic metastatic lesion in the right lobe of the liver, suspicious for new/recurrent hepatic metastatic disease. 3. Interval increase in the soft tissue thickening adjacent to loops of bowel in the right lower quadrant near the right external iliac artery, suspicious for recurrent disease. 4. Slight interval decrease in size of the left retroperitoneal soft tissue nodule. 5. Small focus of soft tissue nodularity along the left vaginal cuff is similar prior. 6.  Aortic Atherosclerosis (ICD10-I70.0).   11/06/2022 -  Chemotherapy   Patient is on Treatment Plan : UTERINE ENDOMETRIAL Dostarlimab-gxly (500 mg) + Carboplatin (AUC 5) + Paclitaxel (175 mg/m2) q21d x 6 cycles / Dostarlimab-gxly (1000 mg) q42d x 6 cycles      01/23/2023 Imaging   CT CHEST ABDOMEN PELVIS W CONTRAST  Result Date: 01/22/2023 CLINICAL DATA:  Follow-up metastatic endometrial carcinoma. Undergoing chemotherapy. * Tracking Code: BO  * EXAM: CT CHEST, ABDOMEN, AND PELVIS WITH CONTRAST TECHNIQUE: Multidetector CT imaging of the chest, abdomen and pelvis was performed following the standard protocol during bolus administration of intravenous contrast. RADIATION DOSE REDUCTION: This exam was performed according to the departmental dose-optimization program which includes automated exposure control, adjustment of the mA and/or kV according to patient size and/or use of iterative reconstruction technique. CONTRAST:  80mL OMNIPAQUE IOHEXOL 300 MG/ML  SOLN COMPARISON:  10/24/2022 FINDINGS: CT CHEST FINDINGS Cardiovascular: No acute findings. Mediastinum/Lymph Nodes: Mediastinal and left hilar lymphadenopathy is decreased since previous study. Precarinal lymph node currently measures 11 mm on image 26/2 compared to 15 mm previously. Previously seen left hilar lymphadenopathy is no longer visualized. No new or increased sites of lymphadenopathy identified. Lungs/Pleura: Previously seen pulmonary nodule in the central left upper lobe has resolved since prior study. Other nodule in the superior segment of the right lower lobe has also resolved. No suspicious pulmonary nodules or masses identified. No evidence of infiltrate or pleural effusion. Musculoskeletal:  No suspicious bone lesions identified. CT ABDOMEN AND PELVIS FINDINGS Hepatobiliary: No suspicious liver lesions seen on today's exam. Previously suspected small lesion in the liver dome is no longer visualized. Gallbladder is unremarkable. No evidence of biliary ductal dilatation. Pancreas:  No mass or inflammatory changes. Spleen:  Within normal limits in size and appearance. Adrenals/Urinary tract: No suspicious masses or hydronephrosis. Chronic right renal atrophy remains stable. Stomach/Bowel: No evidence of obstruction, inflammatory process, or abnormal fluid collections. Vascular/Lymphatic: No pathologically enlarged lymph nodes identified. No acute vascular findings. Aortic atherosclerotic  calcification incidentally noted. Reproductive: Prior  hysterectomy noted. Vaginal cuff is unremarkable in appearance. No pelvic mass identified. Adnexal regions are unremarkable in appearance. Other:  None. Musculoskeletal:  No suspicious bone lesions identified. IMPRESSION: Resolution of pulmonary metastases since prior exam. Interval decrease in mediastinal and left hilar lymphadenopathy. Previously suspected small liver lesion and other subtle areas of soft tissue nodularity in the abdomen and pelvis are no longer visualized. No new or progressive disease. 1 Aortic Atherosclerosis (ICD10-I70.0). Electronically Signed   By: Danae Orleans M.D.   On: 01/22/2023 15:27      Metastasis to lung ([name])  01/28/2022 Initial Diagnosis   Metastasis to lung [facility])   11/06/2022 -  Chemotherapy   Patient is on Treatment Plan : UTERINE ENDOMETRIAL Dostarlimab-gxly (500 mg) + Carboplatin (AUC 5) + Paclitaxel (175 mg/m2) q21d x 6 cycles / Dostarlimab-gxly (1000 mg) q42d x 6 cycles        PHYSICAL EXAMINATION: ECOG PERFORMANCE STATUS: 1 - Symptomatic but completely ambulatory  Vitals:   03/06/23 0928  BP: (!) 156/91  Pulse: 69  Resp: 18  Temp: (!) 97.4 F (36.3 C)  SpO2: 100%   Filed Weights   03/06/23 0928  Weight: 147 lb 6.4 oz (66.9 kg)    GENERAL:alert, no distress and comfortable NEURO: alert & oriented x 3 with fluent speech, no focal motor/sensory deficits  LABORATORY DATA:  I have reviewed the data as listed    Component Value Date/Time   NA 138 03/06/2023 0852   NA 141 02/25/2019 1222   K 4.2 03/06/2023 0852   CL 105 03/06/2023 0852   CO2 27 03/06/2023 0852   GLUCOSE 143 (H) 03/06/2023 0852   BUN 23 (H) 03/06/2023 0852   BUN 17 02/25/2019 1222   CREATININE 1.26 (H) 03/06/2023 0852   CALCIUM 9.3 03/06/2023 0852   PROT 7.6 03/06/2023 0852   PROT 6.9 09/22/2019 1419   ALBUMIN 3.7 03/06/2023 0852   ALBUMIN 3.9 09/22/2019 1419   AST 12 (L) 03/06/2023 0852   ALT 9 03/06/2023 0852    ALKPHOS 47 03/06/2023 0852   BILITOT 0.4 03/06/2023 0852   GFRNONAA 49 (L) 03/06/2023 0852   GFRAA >60 05/16/2019 0335    No results found for: "SPEP", "UPEP"  Lab Results  Component Value Date   WBC 6.3 03/06/2023   NEUTROABS 4.9 03/06/2023   HGB 9.8 (L) 03/06/2023   HCT 29.5 (L) 03/06/2023   MCV 87.8 03/06/2023   PLT 288 03/06/2023      Chemistry      Component Value Date/Time   NA 138 03/06/2023 0852   NA 141 02/25/2019 1222   K 4.2 03/06/2023 0852   CL 105 03/06/2023 0852   CO2 27 03/06/2023 0852   BUN 23 (H) 03/06/2023 0852   BUN 17 02/25/2019 1222   CREATININE 1.26 (H) 03/06/2023 0852      Component Value Date/Time   CALCIUM 9.3 03/06/2023 0852   ALKPHOS 47 03/06/2023 0852   AST 12 (L) 03/06/2023 0852   ALT 9 03/06/2023 0852   BILITOT 0.4 03/06/2023 4259

## 2023-03-06 NOTE — Progress Notes (Signed)
BP elevated at d/c but pt state that it always is.  Verified in chart.  Pt took her BP med today.  She says she usually checks at home & it goes down.

## 2023-03-06 NOTE — Assessment & Plan Note (Signed)
Her blood pressure is better controlled She will continue current antihypertensives

## 2023-03-07 LAB — T4: T4, Total: 7.2 ug/dL (ref 4.5–12.0)

## 2023-03-26 MED FILL — Fosaprepitant Dimeglumine For IV Infusion 150 MG (Base Eq): INTRAVENOUS | Qty: 5 | Status: AC

## 2023-03-26 MED FILL — Dexamethasone Sodium Phosphate Inj 100 MG/10ML: INTRAMUSCULAR | Qty: 1 | Status: AC

## 2023-03-27 ENCOUNTER — Inpatient Hospital Stay (HOSPITAL_BASED_OUTPATIENT_CLINIC_OR_DEPARTMENT_OTHER): Payer: Medicare HMO | Admitting: Hematology and Oncology

## 2023-03-27 ENCOUNTER — Encounter: Payer: Self-pay | Admitting: Hematology and Oncology

## 2023-03-27 ENCOUNTER — Inpatient Hospital Stay: Payer: Medicare HMO

## 2023-03-27 ENCOUNTER — Inpatient Hospital Stay: Payer: Medicare HMO | Attending: Hematology and Oncology

## 2023-03-27 ENCOUNTER — Other Ambulatory Visit: Payer: Self-pay

## 2023-03-27 VITALS — BP 193/109 | HR 72 | Temp 97.9°F | Resp 20

## 2023-03-27 VITALS — BP 171/99 | HR 81 | Resp 18 | Ht 63.0 in | Wt 147.8 lb

## 2023-03-27 DIAGNOSIS — I10 Essential (primary) hypertension: Secondary | ICD-10-CM | POA: Diagnosis not present

## 2023-03-27 DIAGNOSIS — D61818 Other pancytopenia: Secondary | ICD-10-CM

## 2023-03-27 DIAGNOSIS — C7801 Secondary malignant neoplasm of right lung: Secondary | ICD-10-CM

## 2023-03-27 DIAGNOSIS — E1140 Type 2 diabetes mellitus with diabetic neuropathy, unspecified: Secondary | ICD-10-CM

## 2023-03-27 DIAGNOSIS — Z5111 Encounter for antineoplastic chemotherapy: Secondary | ICD-10-CM | POA: Insufficient documentation

## 2023-03-27 DIAGNOSIS — C7802 Secondary malignant neoplasm of left lung: Secondary | ICD-10-CM

## 2023-03-27 DIAGNOSIS — Z79899 Other long term (current) drug therapy: Secondary | ICD-10-CM | POA: Diagnosis not present

## 2023-03-27 DIAGNOSIS — Z79620 Long term (current) use of immunosuppressive biologic: Secondary | ICD-10-CM | POA: Diagnosis not present

## 2023-03-27 DIAGNOSIS — Z5112 Encounter for antineoplastic immunotherapy: Secondary | ICD-10-CM | POA: Diagnosis not present

## 2023-03-27 DIAGNOSIS — C541 Malignant neoplasm of endometrium: Secondary | ICD-10-CM

## 2023-03-27 LAB — CBC WITH DIFFERENTIAL (CANCER CENTER ONLY)
Abs Immature Granulocytes: 0.01 10*3/uL (ref 0.00–0.07)
Basophils Absolute: 0 10*3/uL (ref 0.0–0.1)
Basophils Relative: 0 %
Eosinophils Absolute: 0.1 10*3/uL (ref 0.0–0.5)
Eosinophils Relative: 1 %
HCT: 29 % — ABNORMAL LOW (ref 36.0–46.0)
Hemoglobin: 9.4 g/dL — ABNORMAL LOW (ref 12.0–15.0)
Immature Granulocytes: 0 %
Lymphocytes Relative: 33 %
Lymphs Abs: 2.2 10*3/uL (ref 0.7–4.0)
MCH: 28.6 pg (ref 26.0–34.0)
MCHC: 32.4 g/dL (ref 30.0–36.0)
MCV: 88.1 fL (ref 80.0–100.0)
Monocytes Absolute: 0.8 10*3/uL (ref 0.1–1.0)
Monocytes Relative: 12 %
Neutro Abs: 3.6 10*3/uL (ref 1.7–7.7)
Neutrophils Relative %: 54 %
Platelet Count: 148 10*3/uL — ABNORMAL LOW (ref 150–400)
RBC: 3.29 MIL/uL — ABNORMAL LOW (ref 3.87–5.11)
RDW: 19.6 % — ABNORMAL HIGH (ref 11.5–15.5)
WBC Count: 6.7 10*3/uL (ref 4.0–10.5)
nRBC: 0 % (ref 0.0–0.2)

## 2023-03-27 LAB — TSH: TSH: 1.244 u[IU]/mL (ref 0.350–4.500)

## 2023-03-27 LAB — CMP (CANCER CENTER ONLY)
ALT: 7 U/L (ref 0–44)
AST: 12 U/L — ABNORMAL LOW (ref 15–41)
Albumin: 3.6 g/dL (ref 3.5–5.0)
Alkaline Phosphatase: 49 U/L (ref 38–126)
Anion gap: 5 (ref 5–15)
BUN: 24 mg/dL — ABNORMAL HIGH (ref 6–20)
CO2: 26 mmol/L (ref 22–32)
Calcium: 8.8 mg/dL — ABNORMAL LOW (ref 8.9–10.3)
Chloride: 106 mmol/L (ref 98–111)
Creatinine: 1.17 mg/dL — ABNORMAL HIGH (ref 0.44–1.00)
GFR, Estimated: 54 mL/min — ABNORMAL LOW (ref >60)
Glucose, Bld: 158 mg/dL — ABNORMAL HIGH (ref 70–99)
Potassium: 4.1 mmol/L (ref 3.5–5.1)
Sodium: 137 mmol/L (ref 135–145)
Total Bilirubin: 0.4 mg/dL (ref 0.3–1.2)
Total Protein: 7.7 g/dL (ref 6.5–8.1)

## 2023-03-27 MED ORDER — SODIUM CHLORIDE 0.9% FLUSH
10.0000 mL | INTRAVENOUS | Status: DC | PRN
  Administered 2023-03-27: 10 mL

## 2023-03-27 MED ORDER — SODIUM CHLORIDE 0.9 % IV SOLN
10.0000 mg | Freq: Once | INTRAVENOUS | Status: AC
  Administered 2023-03-27: 10 mg via INTRAVENOUS
  Filled 2023-03-27: qty 10

## 2023-03-27 MED ORDER — METOPROLOL SUCCINATE ER 100 MG PO TB24: 100.0000 mg | ORAL_TABLET | Freq: Every day | ORAL | 3 refills | Status: DC

## 2023-03-27 MED ORDER — PALONOSETRON HCL INJECTION 0.25 MG/5ML
0.2500 mg | Freq: Once | INTRAVENOUS | Status: AC
  Administered 2023-03-27: 0.25 mg via INTRAVENOUS
  Filled 2023-03-27: qty 5

## 2023-03-27 MED ORDER — HEPARIN SOD (PORK) LOCK FLUSH 100 UNIT/ML IV SOLN
500.0000 [IU] | Freq: Once | INTRAVENOUS | Status: AC | PRN
  Administered 2023-03-27: 500 [IU]

## 2023-03-27 MED ORDER — SODIUM CHLORIDE 0.9 % IV SOLN
500.0000 mg | Freq: Once | INTRAVENOUS | Status: AC
  Administered 2023-03-27: 500 mg via INTRAVENOUS
  Filled 2023-03-27: qty 10

## 2023-03-27 MED ORDER — SODIUM CHLORIDE 0.9 % IV SOLN
150.0000 mg | Freq: Once | INTRAVENOUS | Status: AC
  Administered 2023-03-27: 150 mg via INTRAVENOUS
  Filled 2023-03-27: qty 150

## 2023-03-27 MED ORDER — SODIUM CHLORIDE 0.9 % IV SOLN: Freq: Once | INTRAVENOUS | Status: AC

## 2023-03-27 MED ORDER — SODIUM CHLORIDE 0.9 % IV SOLN
395.5000 mg | Freq: Once | INTRAVENOUS | Status: AC
  Administered 2023-03-27: 400 mg via INTRAVENOUS
  Filled 2023-03-27: qty 40

## 2023-03-27 MED ORDER — SODIUM CHLORIDE 0.9 % IV SOLN
131.2500 mg/m2 | Freq: Once | INTRAVENOUS | Status: AC
  Administered 2023-03-27: 228 mg via INTRAVENOUS
  Filled 2023-03-27: qty 38

## 2023-03-27 MED ORDER — FAMOTIDINE IN NACL 20-0.9 MG/50ML-% IV SOLN
20.0000 mg | Freq: Once | INTRAVENOUS | Status: AC
  Administered 2023-03-27: 20 mg via INTRAVENOUS
  Filled 2023-03-27: qty 50

## 2023-03-27 MED ORDER — CETIRIZINE HCL 10 MG/ML IV SOLN
10.0000 mg | Freq: Once | INTRAVENOUS | Status: AC
  Administered 2023-03-27: 10 mg via INTRAVENOUS
  Filled 2023-03-27: qty 1

## 2023-03-27 MED ORDER — SODIUM CHLORIDE 0.9% FLUSH
10.0000 mL | Freq: Once | INTRAVENOUS | Status: AC
  Administered 2023-03-27: 10 mL

## 2023-03-27 NOTE — Assessment & Plan Note (Signed)
Your blood sugar is poorly controlled due to poor adherence to diabetic diet I am hopeful with last dose of chemotherapy today, her blood sugar control will be better without side effects of steroids

## 2023-03-27 NOTE — Progress Notes (Signed)
Pt. blood pressure elevated. Denies chest pain, dizziness, no shortness of breath noted. Declines to stay for further evaluation as her ride is here. Pt. states she will take Losartan when she gets home and pt. instructed pt. to go to emergency room for any further issues concerns.

## 2023-03-27 NOTE — Assessment & Plan Note (Signed)
Her blood pressure is poorly controlled I recommend increasing the dose of metoprolol We discussed importance of lifestyle changes and dietary modification

## 2023-03-27 NOTE — Patient Instructions (Addendum)
[place] [facility]  Discharge Instructions: Thank you for choosing [facility] to provide your oncology and hematology care.   If you have a lab appointment with the Cancer Center, please go directly to the Cancer Center and check in at the registration area.   Wear comfortable clothing and clothing appropriate for easy access to any Portacath or PICC line.   We strive to give you quality time with your provider. You may need to reschedule your appointment if you arrive late (15 or more minutes).  Arriving late affects you and other patients whose appointments are after yours.  Also, if you miss three or more appointments without notifying the office, you may be dismissed from the clinic at the provider's discretion.      For prescription refill requests, have your pharmacy contact our office and allow 72 hours for refills to be completed.    Today you received the following chemotherapy and/or immunotherapy agents: Dostarlimab (Jempreli), Paclitaxel (Taxol) and Carboplatin.     To help prevent nausea and vomiting after your treatment, we encourage you to take your nausea medication as directed.  BELOW ARE SYMPTOMS THAT SHOULD BE REPORTED IMMEDIATELY: *FEVER GREATER THAN 100.4 F (38 C) OR HIGHER *CHILLS OR SWEATING *NAUSEA AND VOMITING THAT IS NOT CONTROLLED WITH YOUR NAUSEA MEDICATION *UNUSUAL SHORTNESS OF BREATH *UNUSUAL BRUISING OR BLEEDING *URINARY PROBLEMS (pain or burning when urinating, or frequent urination) *BOWEL PROBLEMS (unusual diarrhea, constipation, pain near the anus) TENDERNESS IN MOUTH AND THROAT WITH OR WITHOUT PRESENCE OF ULCERS (sore throat, sores in mouth, or a toothache) UNUSUAL RASH, SWELLING OR PAIN  UNUSUAL VAGINAL DISCHARGE OR ITCHING   Items with * indicate a potential emergency and should be followed up as soon as possible or go to the Emergency Department if any problems should occur.  Please show the  CHEMOTHERAPY ALERT CARD or IMMUNOTHERAPY ALERT CARD at check-in to the Emergency Department and triage nurse.  Should you have questions after your visit or need to cancel or reschedule your appointment, please contact [place] [facility]  Dept: [phone]  and follow the prompts.  Office hours are 8:00 a.m. to 4:30 p.m. Monday - Friday. Please note that voicemails left after 4:00 p.m. may not be returned until the following business day.  We are closed weekends and major holidays. You have access to a nurse at all times for urgent questions. Please call the main number to the clinic Dept: [phone] and follow the prompts.   For any non-urgent questions, you may also contact your provider using MyChart. We now offer e-Visits for anyone 25 and older to request care online for non-urgent symptoms. For details visit [email or web address].   Also download the MyChart app! Go to the app store, search "MyChart", open the app, select [place], and log in with your MyChart username and password.

## 2023-03-27 NOTE — Progress Notes (Signed)
[facility] OFFICE PROGRESS NOTE  Patient Care Team: Verlon Au, MD as PCP - General (Family Medicine) Runell Gess, MD as PCP - Cardiology (Cardiology) Artis Delay, MD as Consulting Physician (Hematology and Oncology)  ASSESSMENT & PLAN:  Endometrial cancer [facility]) Her last CT imaging showed positive response to therapy with resolution of pulmonary metastasis The plan will be to continue treatment  We discussed importance of blood pressure and blood sugar control while on treatment Her next imaging study would be in September  Essential hypertension Her blood pressure is poorly controlled I recommend increasing the dose of metoprolol We discussed importance of lifestyle changes and dietary modification  Type 2 diabetes mellitus ([name]) Your blood sugar is poorly controlled due to poor adherence to diabetic diet I am hopeful with last dose of chemotherapy today, her blood sugar control will be better without side effects of steroids  Pancytopenia, acquired ([name]) This is due to side effects of treatment She is not symptomatic Observe only  No orders of the defined types were placed in this encounter.   All questions were answered. The patient knows to call the clinic with any problems, questions or concerns. The total time spent in the appointment was 30 minutes encounter with patients including review of chart and various tests results, discussions about plan of care and coordination of care plan   Artis Delay, MD 03/27/2023 11:47 AM  INTERVAL HISTORY: Please see below for problem oriented charting. she returns for treatment follow-up seen prior to final treatment of chemotherapy today She stated that her blood sugar and blood pressure has been high She has not been compliant with dietary modification Denies worsening peripheral neuropathy No other side effects of treatment  REVIEW OF SYSTEMS:   Constitutional: Denies fevers, chills or abnormal  weight loss Eyes: Denies blurriness of vision Ears, nose, mouth, throat, and face: Denies mucositis or sore throat Respiratory: Denies cough, dyspnea or wheezes Cardiovascular: Denies palpitation, chest discomfort or lower extremity swelling Gastrointestinal:  Denies nausea, heartburn or change in bowel habits Skin: Denies abnormal skin rashes Lymphatics: Denies new lymphadenopathy or easy bruising Neurological:Denies numbness, tingling or new weaknesses Behavioral/Psych: Mood is stable, no new changes  All other systems were reviewed with the patient and are negative.  I have reviewed the past medical history, past surgical history, social history and family history with the patient and they are unchanged from previous note.  ALLERGIES:  is allergic to statins, agrostis alba pollen extract [gramineae pollens], and zetia [ezetimibe].  MEDICATIONS:  Current Outpatient Medications  Medication Sig Dispense Refill   acetaminophen (TYLENOL) 500 MG tablet Take 1,000 mg by mouth every 6 (six) hours as needed for mild pain or headache.     aspirin EC 81 MG tablet Take 1 tablet (81 mg total) by mouth daily. Swallow whole.     Evolocumab (REPATHA SURECLICK) 140 MG/ML SOAJ Inject 140 mg into the skin every 14 (fourteen) days. 2 pen 11   lidocaine-prilocaine (EMLA) cream Apply 1 application topically as needed. 30 g 0   loratadine (CLARITIN) 10 MG tablet Take 1 tablet (10 mg total) by mouth daily as needed for allergies. 30 tablet 11   losartan (COZAAR) 100 MG tablet Take 1 tablet (100 mg total) by mouth daily. 30 tablet 1   metFORMIN (GLUCOPHAGE) 1000 MG tablet Take 1,000 mg by mouth 2 (two) times daily with a meal.     metoprolol succinate (TOPROL XL) 100 MG 24 hr tablet Take 1 tablet (100  mg total) by mouth daily. 30 tablet 3   nitroGLYCERIN (NITROSTAT) 0.4 MG SL tablet Place 1 tablet (0.4 mg total) under the tongue every 5 (five) minutes as needed for chest pain. 30 tablet 2   ondansetron  (ZOFRAN) 8 MG tablet Take 1 tablet (8 mg total) by mouth every 8 (eight) hours as needed for nausea. 30 tablet 3   polyethylene glycol (MIRALAX) 17 g packet Take 17 g by mouth daily. 30 each 1   prochlorperazine (COMPAZINE) 10 MG tablet Take 1 tablet (10 mg total) by mouth every 6 (six) hours as needed for nausea or vomiting. 30 tablet 0   senna-docusate (SENOKOT-S) 8.6-50 MG tablet Take 2 tablets by mouth 2 (two) times daily. 90 tablet 1   No current facility-administered medications for this visit.    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    12/14/2020 Imaging   CT chest abdomen and pelvis IMPRESSION:  1.  2 small nodules in the right lung, nonspecific.  2.  Prominent precarinal lymph node versus adjacent small nodes.  3.  Enlarged heterogeneous uterine fundus.  4.  No definite adenopathy in the abdomen or pelvis. Nonspecific small lymph nodes are present, not enlarged by CT criteria.  5.  Mild wall thickening of the anterior aspect of the urinary bladder which may be due to to nondistention or inflammation.    12/27/2020 Pathology Results   SPECIMEN     Procedure:    Total hysterectomy and bilateral salpingo-oophorectomy   TUMOR     Histologic Type:    Serous carcinoma     Histologic Grade:    Not applicable     Myometrial Invasion:    Present       Depth of Myometrial Invasion:    11 mm       Myometrial Thickness:    12 mm       Percentage of Myometrial Invasion:    92 %     Uterine Serosa Involvement:    Not identified     Cervical Stromal Involvement:    Not identified     Other Tissue / Organ Involvement:    Not identified     Peritoneal / Ascitic Fluid:    Not identified     Lymphovascular Invasion (LVI):    Present   MARGINS      Margin Status:    Not applicable   REGIONAL LYMPH NODES     Regional Lymph Node Status:           :    All regional lymph nodes negative for tumor cells         Lymph Nodes Examined:               Total Number of Pelvic Nodes Examined:    3           Number of Pelvic Sentinel Nodes Examined:    3           Total Number of Para-aortic Nodes Examined:    0           Number of Para-aortic Sentinel Nodes Examined:    Not applicable   DISTANT METASTASIS     Distant Site(s) Involved:    Not applicable   PATHOLOGIC  STAGE CLASSIFICATION (pTNM, AJCC 8th Edition)     Reporting of pT, pN, and (when applicable) pM categories is based on information available to the pathologist at the time the report is issued. As per the AJCC (Chapter 1, 8th Ed.) it is the managing physician's responsibility to establish the final pathologic stage based upon all pertinent information, including but potentially not limited to this pathology report.     TNM Descriptors:    Not applicable     Tumor Modifier:    Not applicable     pT Category:    pT1b     Regional Lymph Nodes Modifier:    (sn)     pN Category:    pN0   The endometrial adenocarcinoma was analyzed by IHC for DNA mismatch repair proteins.  The neoplasm retained nuclear expression of all four mismatch repair proteins, MLH1, PMS2, MSH2, MSH6.    Estrogen receptors (ER): Negative Progesterone receptors (PR): Negative  Controls worked appropriately.   A HER2 stain is POSITIVE (3+).   12/27/2020 Surgery   At [facility], she underwent a robotic hysterectomy/BSO, sentinel node dissection and partial omentectomy. She was found to have an enlarged uterus. An enlarged right pelvic sentinel node.    02/23/2021 Echocardiogram   Left Ventricle: Systolic function is normal. EF: 55-60%.    Left Ventricle: Doppler parameters consistent with mild diastolic  dysfunction and low to normal LA pressure.    Left Ventricle: There is mild concentric hypertrophy.    Aortic  Valve: Mild aortic valve regurgitation.   01/27/2022 Imaging   CT chest imaging 1. No pulmonary embolus. 2. Findings consistent with intrathoracic malignancy. Multiple pulmonary nodules throughout both lungs of varying sizes, consistent with metastatic disease. Primary site of malignancy may represent a 4 cm spiculated nodule in the right perihilar region spanning the fissure that is cavitating. Alternatively all of these nodules may be metastatic given history of cancer, type not specified.  3. Multifocal mediastinal and bilateral hilar adenopathy, consistent metastatic disease. 4. Nondisplaced left anterior seventh and eighth rib fractures, soft tissue thickening adjacent to the seventh rib fracture suggests this may be pathologic 5. Right hydronephrosis is partially included in the field of view in the upper abdomen. Small soft tissue density medial to the right hepatic lobe posterior to the right kidney is nonspecific but may represent metastatic disease. Recommend staging CT of the abdomen and pelvis with oral and IV contrast. 6. Moderate emphysema. 7. Aortic atherosclerosis.  Coronary artery calcifications.   01/28/2022 Initial Diagnosis   Endometrial cancer ([name])   01/28/2022 Cancer Staging   Staging form: Corpus Uteri - Carcinoma and Carcinosarcoma, AJCC 8th Edition - Clinical stage from 01/28/2022: FIGO Stage IVB (rcT1b, cN2a, cM1) - Signed by Artis Delay, MD on 01/28/2022 Stage prefix: Recurrence   01/28/2022 Imaging   CT abdomen and pelvis 1. Multiple abdominal and pelvic metastatic implants as above. 2. Moderate right hydronephrosis secondary to mass effect and obstruction of the distal right ureter by a right pelvic metastatic implant. 3. Multiple pulmonary metastatic disease in the visualized right lung base. 4. No bowel obstruction. Normal appendix. 5. Aortic Atherosclerosis (ICD10-I70.0).   01/30/2022 Echocardiogram    1. Left ventricular ejection fraction, by estimation, is 55  to 60%. The left ventricle has normal function. The left ventricle has no regional wall motion abnormalities. There is moderate concentric left ventricular  hypertrophy. Left ventricular diastolic parameters are consistent with Grade I diastolic dysfunction (impaired relaxation).   2. Right ventricular systolic function is normal.  The right ventricular size is normal. There is normal pulmonary artery systolic pressure.   3. Left atrial size was mildly dilated.   4. The mitral valve is normal in structure. Trivial mitral valve regurgitation. No evidence of mitral stenosis.   5. The aortic valve is tricuspid. Aortic valve regurgitation is trivial. No aortic stenosis is present.   6. Aortic dilatation noted. There is mild dilatation of the aortic root, measuring 39 mm.   7. The inferior vena cava is normal in size with <50% respiratory variability, suggesting right atrial pressure of 8 mmHg.    01/31/2022 - 04/04/2022 Chemotherapy   Patient is on Treatment Plan : UTERINE SEROUS CARCINOMA Carboplatin + Paclitaxel + Trastuzumab q21d x 6 Cycles / Trastuzumab q21d     01/31/2022 - 05/18/2022 Chemotherapy   Patient is on Treatment Plan : UTERINE SEROUS CARCINOMA Carboplatin + Paclitaxel + Trastuzumab q21d x 6 Cycles / Trastuzumab q21d     04/03/2022 Imaging   1. Pulmonary nodules are either reduced in size or completely resolved. No new nodules. 2. Marked reduction in size peritoneal nodular metastasis. Multiple nodular lesions are no longer measurable. 3. Interval reduction in size of implant in the RIGHT operator space. While this implant is smaller, lesion continues to obstruct the RIGHT ureter with persistent RIGHT hydronephrosis and hydroureter. 4. No new metastatic lesions are present.   05/09/2022 Echocardiogram    1. Left ventricular ejection fraction, by estimation, is 45 to 50%. The left ventricle has mildly decreased function. The left ventricle demonstrates global hypokinesis. There is moderate  concentric left ventricular hypertrophy. Left ventricular diastolic parameters are consistent with Grade I diastolic dysfunction (impaired relaxation). The average left ventricular global longitudinal strain is -10.8 %. The global longitudinal strain is abnormal.  2. Right ventricular systolic function is normal. The right ventricular size is normal.  3. The mitral valve is normal in structure. No evidence of mitral valve regurgitation.  4. The aortic valve is tricuspid. Aortic valve regurgitation is mild.  5. There is mild dilatation of the ascending aorta, measuring 39 mm.  6. The inferior vena cava is normal in size with <50% respiratory variability, suggesting right atrial pressure of 8 mmHg.   07/24/2022 Imaging   1. Improving appearance of sequela of metastatic disease. RIGHT pelvic sidewall mass obstructing the RIGHT ureter is no longer clearly seen. LEFT retroperitoneal soft tissue has diminished. 2. No signs of pulmonary metastatic disease or metastatic disease to the chest. 3. Mild RIGHT hydroureter without hydronephrosis. Resolution of hydronephrosis but with further atrophy of the RIGHT kidney. 4. Aortic atherosclerosis and 3 vessel coronary artery disease.   Aortic Atherosclerosis (ICD10-I70.0).   10/25/2022 Imaging   1. New pathologically enlarged mediastinal and left hilar lymph nodes as well as redemonstration previously treated/resolved pulmonary nodules, consistent with thoracic metastatic disease. Consider further evaluation by nuclear medicine PET-CT. 2. New subtle 14 mm hypodense lesion in the hepatic dome with increased conspicuity of a previously treated hepatic metastatic lesion in the right lobe of the liver, suspicious for new/recurrent hepatic metastatic disease. 3. Interval increase in the soft tissue thickening adjacent to loops of bowel in the right lower quadrant near the right external iliac artery, suspicious for recurrent disease. 4. Slight interval decrease in  size of the left retroperitoneal soft tissue nodule. 5. Small focus of soft tissue nodularity along the left vaginal cuff is similar prior. 6.  Aortic Atherosclerosis (ICD10-I70.0).   11/06/2022 -  Chemotherapy   Patient is on Treatment Plan : UTERINE  ENDOMETRIAL Dostarlimab-gxly (500 mg) + Carboplatin (AUC 5) + Paclitaxel (175 mg/m2) q21d x 6 cycles / Dostarlimab-gxly (1000 mg) q42d x 6 cycles      01/23/2023 Imaging   CT CHEST ABDOMEN PELVIS W CONTRAST  Result Date: 01/22/2023 CLINICAL DATA:  Follow-up metastatic endometrial carcinoma. Undergoing chemotherapy. * Tracking Code: BO * EXAM: CT CHEST, ABDOMEN, AND PELVIS WITH CONTRAST TECHNIQUE: Multidetector CT imaging of the chest, abdomen and pelvis was performed following the standard protocol during bolus administration of intravenous contrast. RADIATION DOSE REDUCTION: This exam was performed according to the departmental dose-optimization program which includes automated exposure control, adjustment of the mA and/or kV according to patient size and/or use of iterative reconstruction technique. CONTRAST:  80mL OMNIPAQUE IOHEXOL 300 MG/ML  SOLN COMPARISON:  10/24/2022 FINDINGS: CT CHEST FINDINGS Cardiovascular: No acute findings. Mediastinum/Lymph Nodes: Mediastinal and left hilar lymphadenopathy is decreased since previous study. Precarinal lymph node currently measures 11 mm on image 26/2 compared to 15 mm previously. Previously seen left hilar lymphadenopathy is no longer visualized. No new or increased sites of lymphadenopathy identified. Lungs/Pleura: Previously seen pulmonary nodule in the central left upper lobe has resolved since prior study. Other nodule in the superior segment of the right lower lobe has also resolved. No suspicious pulmonary nodules or masses identified. No evidence of infiltrate or pleural effusion. Musculoskeletal:  No suspicious bone lesions identified. CT ABDOMEN AND PELVIS FINDINGS Hepatobiliary: No suspicious liver  lesions seen on today's exam. Previously suspected small lesion in the liver dome is no longer visualized. Gallbladder is unremarkable. No evidence of biliary ductal dilatation. Pancreas:  No mass or inflammatory changes. Spleen:  Within normal limits in size and appearance. Adrenals/Urinary tract: No suspicious masses or hydronephrosis. Chronic right renal atrophy remains stable. Stomach/Bowel: No evidence of obstruction, inflammatory process, or abnormal fluid collections. Vascular/Lymphatic: No pathologically enlarged lymph nodes identified. No acute vascular findings. Aortic atherosclerotic calcification incidentally noted. Reproductive: Prior hysterectomy noted. Vaginal cuff is unremarkable in appearance. No pelvic mass identified. Adnexal regions are unremarkable in appearance. Other:  None. Musculoskeletal:  No suspicious bone lesions identified. IMPRESSION: Resolution of pulmonary metastases since prior exam. Interval decrease in mediastinal and left hilar lymphadenopathy. Previously suspected small liver lesion and other subtle areas of soft tissue nodularity in the abdomen and pelvis are no longer visualized. No new or progressive disease. 1 Aortic Atherosclerosis (ICD10-I70.0). Electronically Signed   By: Danae Orleans M.D.   On: 01/22/2023 15:27      Metastasis to lung ([name])  01/28/2022 Initial Diagnosis   Metastasis to lung ([name])   11/06/2022 -  Chemotherapy   Patient is on Treatment Plan : UTERINE ENDOMETRIAL Dostarlimab-gxly (500 mg) + Carboplatin (AUC 5) + Paclitaxel (175 mg/m2) q21d x 6 cycles / Dostarlimab-gxly (1000 mg) q42d x 6 cycles        PHYSICAL EXAMINATION: ECOG PERFORMANCE STATUS: 1 - Symptomatic but completely ambulatory  Vitals:   03/27/23 1033  BP: (!) 171/99  Pulse: 81  Resp: 18  SpO2: 100%   Filed Weights   03/27/23 1033  Weight: 147 lb 12.8 oz (67 kg)    GENERAL:alert, no distress and comfortable NEURO: alert & oriented x 3 with fluent speech, no focal  motor/sensory deficits  LABORATORY DATA:  I have reviewed the data as listed    Component Value Date/Time   NA 137 03/27/2023 1008   NA 141 02/25/2019 1222   K 4.1 03/27/2023 1008   CL 106 03/27/2023 1008   CO2 26 03/27/2023 1008  GLUCOSE 158 (H) 03/27/2023 1008   BUN 24 (H) 03/27/2023 1008   BUN 17 02/25/2019 1222   CREATININE 1.17 (H) 03/27/2023 1008   CALCIUM 8.8 (L) 03/27/2023 1008   PROT 7.7 03/27/2023 1008   PROT 6.9 09/22/2019 1419   ALBUMIN 3.6 03/27/2023 1008   ALBUMIN 3.9 09/22/2019 1419   AST 12 (L) 03/27/2023 1008   ALT 7 03/27/2023 1008   ALKPHOS 49 03/27/2023 1008   BILITOT 0.4 03/27/2023 1008   GFRNONAA 54 (L) 03/27/2023 1008   GFRAA >60 05/16/2019 0335    No results found for: "SPEP", "UPEP"  Lab Results  Component Value Date   WBC 6.7 03/27/2023   NEUTROABS 3.6 03/27/2023   HGB 9.4 (L) 03/27/2023   HCT 29.0 (L) 03/27/2023   MCV 88.1 03/27/2023   PLT 148 (L) 03/27/2023      Chemistry      Component Value Date/Time   NA 137 03/27/2023 1008   NA 141 02/25/2019 1222   K 4.1 03/27/2023 1008   CL 106 03/27/2023 1008   CO2 26 03/27/2023 1008   BUN 24 (H) 03/27/2023 1008   BUN 17 02/25/2019 1222   CREATININE 1.17 (H) 03/27/2023 1008      Component Value Date/Time   CALCIUM 8.8 (L) 03/27/2023 1008   ALKPHOS 49 03/27/2023 1008   AST 12 (L) 03/27/2023 1008   ALT 7 03/27/2023 1008   BILITOT 0.4 03/27/2023 1008

## 2023-03-27 NOTE — Assessment & Plan Note (Signed)
This is due to side effects of treatment She is not symptomatic Observe only 

## 2023-03-27 NOTE — Assessment & Plan Note (Signed)
Her last CT imaging showed positive response to therapy with resolution of pulmonary metastasis The plan will be to continue treatment  We discussed importance of blood pressure and blood sugar control while on treatment Her next imaging study would be in September

## 2023-04-17 ENCOUNTER — Encounter: Payer: Self-pay | Admitting: Hematology and Oncology

## 2023-04-17 ENCOUNTER — Inpatient Hospital Stay: Payer: Medicare HMO

## 2023-04-17 ENCOUNTER — Inpatient Hospital Stay: Payer: Medicare HMO | Attending: Hematology and Oncology

## 2023-04-17 ENCOUNTER — Inpatient Hospital Stay (HOSPITAL_BASED_OUTPATIENT_CLINIC_OR_DEPARTMENT_OTHER): Payer: Medicare HMO | Admitting: Hematology and Oncology

## 2023-04-17 VITALS — BP 173/98 | HR 71 | Temp 98.0°F | Resp 20 | Wt 147.0 lb

## 2023-04-17 DIAGNOSIS — D6481 Anemia due to antineoplastic chemotherapy: Secondary | ICD-10-CM

## 2023-04-17 DIAGNOSIS — Z5112 Encounter for antineoplastic immunotherapy: Secondary | ICD-10-CM | POA: Insufficient documentation

## 2023-04-17 DIAGNOSIS — N183 Chronic kidney disease, stage 3 unspecified: Secondary | ICD-10-CM | POA: Diagnosis not present

## 2023-04-17 DIAGNOSIS — Z79620 Long term (current) use of immunosuppressive biologic: Secondary | ICD-10-CM | POA: Diagnosis not present

## 2023-04-17 DIAGNOSIS — I10 Essential (primary) hypertension: Secondary | ICD-10-CM

## 2023-04-17 DIAGNOSIS — T451X5A Adverse effect of antineoplastic and immunosuppressive drugs, initial encounter: Secondary | ICD-10-CM

## 2023-04-17 DIAGNOSIS — C7802 Secondary malignant neoplasm of left lung: Secondary | ICD-10-CM

## 2023-04-17 DIAGNOSIS — C7801 Secondary malignant neoplasm of right lung: Secondary | ICD-10-CM | POA: Diagnosis not present

## 2023-04-17 DIAGNOSIS — C541 Malignant neoplasm of endometrium: Secondary | ICD-10-CM | POA: Diagnosis not present

## 2023-04-17 LAB — CBC WITH DIFFERENTIAL (CANCER CENTER ONLY)
Abs Immature Granulocytes: 0.02 10*3/uL (ref 0.00–0.07)
Basophils Absolute: 0 10*3/uL (ref 0.0–0.1)
Basophils Relative: 0 %
Eosinophils Absolute: 0 10*3/uL (ref 0.0–0.5)
Eosinophils Relative: 1 %
HCT: 27.3 % — ABNORMAL LOW (ref 36.0–46.0)
Hemoglobin: 8.9 g/dL — ABNORMAL LOW (ref 12.0–15.0)
Immature Granulocytes: 0 %
Lymphocytes Relative: 29 %
Lymphs Abs: 1.7 10*3/uL (ref 0.7–4.0)
MCH: 29.7 pg (ref 26.0–34.0)
MCHC: 32.6 g/dL (ref 30.0–36.0)
MCV: 91 fL (ref 80.0–100.0)
Monocytes Absolute: 0.5 10*3/uL (ref 0.1–1.0)
Monocytes Relative: 9 %
Neutro Abs: 3.6 10*3/uL (ref 1.7–7.7)
Neutrophils Relative %: 61 %
Platelet Count: 157 10*3/uL (ref 150–400)
RBC: 3 MIL/uL — ABNORMAL LOW (ref 3.87–5.11)
RDW: 20.6 % — ABNORMAL HIGH (ref 11.5–15.5)
WBC Count: 5.9 10*3/uL (ref 4.0–10.5)
nRBC: 0 % (ref 0.0–0.2)

## 2023-04-17 LAB — CMP (CANCER CENTER ONLY)
ALT: 11 U/L (ref 0–44)
AST: 15 U/L (ref 15–41)
Albumin: 3.5 g/dL (ref 3.5–5.0)
Alkaline Phosphatase: 48 U/L (ref 38–126)
Anion gap: 5 (ref 5–15)
BUN: 21 mg/dL — ABNORMAL HIGH (ref 6–20)
CO2: 27 mmol/L (ref 22–32)
Calcium: 8.5 mg/dL — ABNORMAL LOW (ref 8.9–10.3)
Chloride: 106 mmol/L (ref 98–111)
Creatinine: 1.24 mg/dL — ABNORMAL HIGH (ref 0.44–1.00)
GFR, Estimated: 50 mL/min — ABNORMAL LOW (ref >60)
Glucose, Bld: 137 mg/dL — ABNORMAL HIGH (ref 70–99)
Potassium: 3.8 mmol/L (ref 3.5–5.1)
Sodium: 138 mmol/L (ref 135–145)
Total Bilirubin: 0.4 mg/dL (ref 0.3–1.2)
Total Protein: 7.1 g/dL (ref 6.5–8.1)

## 2023-04-17 LAB — TSH: TSH: 0.876 u[IU]/mL (ref 0.350–4.500)

## 2023-04-17 MED ORDER — HYDROCHLOROTHIAZIDE 25 MG PO TABS: 25.0000 mg | ORAL_TABLET | Freq: Every day | ORAL | 3 refills | Status: DC

## 2023-04-17 MED ORDER — SODIUM CHLORIDE 0.9 % IV SOLN
1000.0000 mg | Freq: Once | INTRAVENOUS | Status: AC
  Administered 2023-04-17: 1000 mg via INTRAVENOUS
  Filled 2023-04-17: qty 20

## 2023-04-17 MED ORDER — SODIUM CHLORIDE 0.9% FLUSH
10.0000 mL | INTRAVENOUS | Status: DC | PRN
  Administered 2023-04-17: 10 mL

## 2023-04-17 MED ORDER — SODIUM CHLORIDE 0.9 % IV SOLN: Freq: Once | INTRAVENOUS | Status: AC

## 2023-04-17 MED ORDER — HEPARIN SOD (PORK) LOCK FLUSH 100 UNIT/ML IV SOLN
500.0000 [IU] | Freq: Once | INTRAVENOUS | Status: AC | PRN
  Administered 2023-04-17: 500 [IU]

## 2023-04-17 MED ORDER — SODIUM CHLORIDE 0.9% FLUSH
10.0000 mL | Freq: Once | INTRAVENOUS | Status: AC
  Administered 2023-04-17: 10 mL

## 2023-04-17 NOTE — Progress Notes (Signed)
[facility] OFFICE PROGRESS NOTE  Patient Care Team: Verlon Au, MD as PCP - General (Family Medicine) Runell Gess, MD as PCP - Cardiology (Cardiology) Artis Delay, MD as Consulting Physician (Hematology and Oncology)  ASSESSMENT & PLAN:  Endometrial cancer [facility]) Her last CT imaging showed positive response to therapy with resolution of pulmonary metastasis The plan will be to continue treatment  We discussed importance of blood pressure and blood sugar control while on treatment I plan to space out imaging study.  I will order it after her treatment  Anemia due to antineoplastic chemotherapy This is likely due to recent treatment. The patient denies recent history of bleeding such as epistaxis, hematuria or hematochezia. She is asymptomatic from the anemia. I will observe for now.  She does not require transfusion now. I will continue the chemotherapy at current dose without dosage adjustment.  If the anemia gets progressive worse in the future, I might have to delay her treatment or adjust the chemotherapy dose.   CKD (chronic kidney disease), stage III ([name]) She has intermittent acute on chronic renal failure We discussed importance of hydration and risk factor modification She can proceed as long as creatinine is less than 2  Essential hypertension Her blood pressure is poorly controlled Despite multiple different medication adjustment, she continues to have hypertension due to poor lifestyle choices I will add diuretic We discussed risk of dehydration with diuretic therapy and advised her to drink lots of water  No orders of the defined types were placed in this encounter.   All questions were answered. The patient knows to call the clinic with any problems, questions or concerns. The total time spent in the appointment was 30 minutes encounter with patients including review of chart and various tests results, discussions about plan of care and  coordination of care plan   Artis Delay, MD 04/17/2023 12:41 PM  INTERVAL HISTORY: Please see below for problem oriented charting. she returns for treatment follow-up She is doing well except she continues to have uncontrolled hypertension and diabetes According to her mother, the patient has not been adherent to low-salt diet and exercise She is not symptomatic from hypertension She is not symptomatic from her disease or treatment  REVIEW OF SYSTEMS:   Constitutional: Denies fevers, chills or abnormal weight loss Eyes: Denies blurriness of vision Ears, nose, mouth, throat, and face: Denies mucositis or sore throat Respiratory: Denies cough, dyspnea or wheezes Cardiovascular: Denies palpitation, chest discomfort or lower extremity swelling Gastrointestinal:  Denies nausea, heartburn or change in bowel habits Skin: Denies abnormal skin rashes Lymphatics: Denies new lymphadenopathy or easy bruising Neurological:Denies numbness, tingling or new weaknesses Behavioral/Psych: Mood is stable, no new changes  All other systems were reviewed with the patient and are negative.  I have reviewed the past medical history, past surgical history, social history and family history with the patient and they are unchanged from previous note.  ALLERGIES:  is allergic to statins, agrostis alba pollen extract [gramineae pollens], and zetia [ezetimibe].  MEDICATIONS:  Current Outpatient Medications  Medication Sig Dispense Refill   hydrochlorothiazide (HYDRODIURIL) 25 MG tablet Take 1 tablet (25 mg total) by mouth daily. 30 tablet 3   acetaminophen (TYLENOL) 500 MG tablet Take 1,000 mg by mouth every 6 (six) hours as needed for mild pain or headache.     aspirin EC 81 MG tablet Take 1 tablet (81 mg total) by mouth daily. Swallow whole.     Evolocumab (REPATHA SURECLICK) 140 MG/ML  SOAJ Inject 140 mg into the skin every 14 (fourteen) days. 2 pen 11   lidocaine-prilocaine (EMLA) cream Apply 1 application  topically as needed. 30 g 0   loratadine (CLARITIN) 10 MG tablet Take 1 tablet (10 mg total) by mouth daily as needed for allergies. 30 tablet 11   losartan (COZAAR) 100 MG tablet Take 1 tablet (100 mg total) by mouth daily. 30 tablet 1   metFORMIN (GLUCOPHAGE) 1000 MG tablet Take 1,000 mg by mouth 2 (two) times daily with a meal.     metoprolol succinate (TOPROL XL) 100 MG 24 hr tablet Take 1 tablet (100 mg total) by mouth daily. 30 tablet 3   nitroGLYCERIN (NITROSTAT) 0.4 MG SL tablet Place 1 tablet (0.4 mg total) under the tongue every 5 (five) minutes as needed for chest pain. 30 tablet 2   ondansetron (ZOFRAN) 8 MG tablet Take 1 tablet (8 mg total) by mouth every 8 (eight) hours as needed for nausea. 30 tablet 3   polyethylene glycol (MIRALAX) 17 g packet Take 17 g by mouth daily. 30 each 1   prochlorperazine (COMPAZINE) 10 MG tablet Take 1 tablet (10 mg total) by mouth every 6 (six) hours as needed for nausea or vomiting. 30 tablet 0   senna-docusate (SENOKOT-S) 8.6-50 MG tablet Take 2 tablets by mouth 2 (two) times daily. 90 tablet 1   No current facility-administered medications for this visit.    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    12/14/2020 Imaging   CT chest abdomen and pelvis IMPRESSION:  1.  2 small nodules in the right lung, nonspecific.  2.  Prominent precarinal lymph node versus adjacent small nodes.  3.  Enlarged heterogeneous uterine fundus.  4.  No definite adenopathy in the abdomen or pelvis. Nonspecific small lymph nodes are present, not enlarged by CT criteria.  5.  Mild wall thickening of the anterior aspect of the urinary bladder which may be due to to nondistention or inflammation.    12/27/2020 Pathology Results    SPECIMEN     Procedure:    Total hysterectomy and bilateral salpingo-oophorectomy   TUMOR     Histologic Type:    Serous carcinoma     Histologic Grade:    Not applicable     Myometrial Invasion:    Present       Depth of Myometrial Invasion:    11 mm       Myometrial Thickness:    12 mm       Percentage of Myometrial Invasion:    92 %     Uterine Serosa Involvement:    Not identified     Cervical Stromal Involvement:    Not identified     Other Tissue / Organ Involvement:    Not identified     Peritoneal / Ascitic Fluid:    Not identified     Lymphovascular Invasion (LVI):    Present   MARGINS     Margin Status:    Not applicable   REGIONAL LYMPH NODES     Regional Lymph Node Status:           :    All regional lymph nodes negative for tumor cells         Lymph  Nodes Examined:               Total Number of Pelvic Nodes Examined:    3           Number of Pelvic Sentinel Nodes Examined:    3           Total Number of Para-aortic Nodes Examined:    0           Number of Para-aortic Sentinel Nodes Examined:    Not applicable   DISTANT METASTASIS     Distant Site(s) Involved:    Not applicable   PATHOLOGIC STAGE CLASSIFICATION (pTNM, AJCC 8th Edition)     Reporting of pT, pN, and (when applicable) pM categories is based on information available to the pathologist at the time the report is issued. As per the AJCC (Chapter 1, 8th Ed.) it is the managing physician's responsibility to establish the final pathologic stage based upon all pertinent information, including but potentially not limited to this pathology report.     TNM Descriptors:    Not applicable     Tumor Modifier:    Not applicable     pT Category:    pT1b     Regional Lymph Nodes Modifier:    (sn)     pN Category:    pN0   The endometrial adenocarcinoma was analyzed by IHC for DNA mismatch repair proteins.  The neoplasm retained nuclear expression of all four mismatch repair proteins, MLH1, PMS2, MSH2, MSH6.     Estrogen receptors (ER): Negative Progesterone receptors (PR): Negative  Controls worked appropriately.   A HER2 stain is POSITIVE (3+).   12/27/2020 Surgery   At [facility], she underwent a robotic hysterectomy/BSO, sentinel node dissection and partial omentectomy. She was found to have an enlarged uterus. An enlarged right pelvic sentinel node.    02/23/2021 Echocardiogram   Left Ventricle: Systolic function is normal. EF: 55-60%.    Left Ventricle: Doppler parameters consistent with mild diastolic  dysfunction and low to normal LA pressure.    Left Ventricle: There is mild concentric hypertrophy.    Aortic Valve: Mild aortic valve regurgitation.   01/27/2022 Imaging   CT chest imaging 1. No pulmonary embolus. 2. Findings consistent with intrathoracic malignancy. Multiple pulmonary nodules throughout both lungs of varying sizes, consistent with metastatic disease. Primary site of malignancy may represent a 4 cm spiculated nodule in the right perihilar region spanning the fissure that is cavitating. Alternatively all of these nodules may be metastatic given history of cancer, type not specified.  3. Multifocal mediastinal and bilateral hilar adenopathy, consistent metastatic disease. 4. Nondisplaced left anterior seventh and eighth rib fractures, soft tissue thickening adjacent to the seventh rib fracture suggests this may be pathologic 5. Right hydronephrosis is partially included in the field of view in the upper abdomen. Small soft tissue density medial to the right hepatic lobe posterior to the right kidney is nonspecific but may represent metastatic disease. Recommend staging CT of the abdomen and pelvis with oral and IV contrast. 6. Moderate emphysema. 7. Aortic atherosclerosis.  Coronary artery calcifications.   01/28/2022 Initial Diagnosis   Endometrial cancer ([name])   01/28/2022 Cancer Staging   Staging form: Corpus Uteri - Carcinoma and Carcinosarcoma, AJCC 8th Edition - Clinical  stage from 01/28/2022: FIGO Stage IVB (rcT1b, cN2a, cM1) - Signed by Artis Delay, MD on 01/28/2022 Stage prefix: Recurrence   01/28/2022 Imaging   CT abdomen and pelvis 1. Multiple abdominal and pelvic metastatic implants as  above. 2. Moderate right hydronephrosis secondary to mass effect and obstruction of the distal right ureter by a right pelvic metastatic implant. 3. Multiple pulmonary metastatic disease in the visualized right lung base. 4. No bowel obstruction. Normal appendix. 5. Aortic Atherosclerosis (ICD10-I70.0).   01/30/2022 Echocardiogram    1. Left ventricular ejection fraction, by estimation, is 55 to 60%. The left ventricle has normal function. The left ventricle has no regional wall motion abnormalities. There is moderate concentric left ventricular  hypertrophy. Left ventricular diastolic parameters are consistent with Grade I diastolic dysfunction (impaired relaxation).   2. Right ventricular systolic function is normal. The right ventricular size is normal. There is normal pulmonary artery systolic pressure.   3. Left atrial size was mildly dilated.   4. The mitral valve is normal in structure. Trivial mitral valve regurgitation. No evidence of mitral stenosis.   5. The aortic valve is tricuspid. Aortic valve regurgitation is trivial. No aortic stenosis is present.   6. Aortic dilatation noted. There is mild dilatation of the aortic root, measuring 39 mm.   7. The inferior vena cava is normal in size with <50% respiratory variability, suggesting right atrial pressure of 8 mmHg.    01/31/2022 - 04/04/2022 Chemotherapy   Patient is on Treatment Plan : UTERINE SEROUS CARCINOMA Carboplatin + Paclitaxel + Trastuzumab q21d x 6 Cycles / Trastuzumab q21d     01/31/2022 - 05/18/2022 Chemotherapy   Patient is on Treatment Plan : UTERINE SEROUS CARCINOMA Carboplatin + Paclitaxel + Trastuzumab q21d x 6 Cycles / Trastuzumab q21d     04/03/2022 Imaging   1. Pulmonary nodules are either  reduced in size or completely resolved. No new nodules. 2. Marked reduction in size peritoneal nodular metastasis. Multiple nodular lesions are no longer measurable. 3. Interval reduction in size of implant in the RIGHT operator space. While this implant is smaller, lesion continues to obstruct the RIGHT ureter with persistent RIGHT hydronephrosis and hydroureter. 4. No new metastatic lesions are present.   05/09/2022 Echocardiogram    1. Left ventricular ejection fraction, by estimation, is 45 to 50%. The left ventricle has mildly decreased function. The left ventricle demonstrates global hypokinesis. There is moderate concentric left ventricular hypertrophy. Left ventricular diastolic parameters are consistent with Grade I diastolic dysfunction (impaired relaxation). The average left ventricular global longitudinal strain is -10.8 %. The global longitudinal strain is abnormal.  2. Right ventricular systolic function is normal. The right ventricular size is normal.  3. The mitral valve is normal in structure. No evidence of mitral valve regurgitation.  4. The aortic valve is tricuspid. Aortic valve regurgitation is mild.  5. There is mild dilatation of the ascending aorta, measuring 39 mm.  6. The inferior vena cava is normal in size with <50% respiratory variability, suggesting right atrial pressure of 8 mmHg.   07/24/2022 Imaging   1. Improving appearance of sequela of metastatic disease. RIGHT pelvic sidewall mass obstructing the RIGHT ureter is no longer clearly seen. LEFT retroperitoneal soft tissue has diminished. 2. No signs of pulmonary metastatic disease or metastatic disease to the chest. 3. Mild RIGHT hydroureter without hydronephrosis. Resolution of hydronephrosis but with further atrophy of the RIGHT kidney. 4. Aortic atherosclerosis and 3 vessel coronary artery disease.   Aortic Atherosclerosis (ICD10-I70.0).   10/25/2022 Imaging   1. New pathologically enlarged mediastinal and  left hilar lymph nodes as well as redemonstration previously treated/resolved pulmonary nodules, consistent with thoracic metastatic disease. Consider further evaluation by nuclear medicine PET-CT. 2. New subtle 14  mm hypodense lesion in the hepatic dome with increased conspicuity of a previously treated hepatic metastatic lesion in the right lobe of the liver, suspicious for new/recurrent hepatic metastatic disease. 3. Interval increase in the soft tissue thickening adjacent to loops of bowel in the right lower quadrant near the right external iliac artery, suspicious for recurrent disease. 4. Slight interval decrease in size of the left retroperitoneal soft tissue nodule. 5. Small focus of soft tissue nodularity along the left vaginal cuff is similar prior. 6.  Aortic Atherosclerosis (ICD10-I70.0).   11/06/2022 -  Chemotherapy   Patient is on Treatment Plan : UTERINE ENDOMETRIAL Dostarlimab-gxly (500 mg) + Carboplatin (AUC 5) + Paclitaxel (175 mg/m2) q21d x 6 cycles / Dostarlimab-gxly (1000 mg) q42d x 6 cycles      01/23/2023 Imaging   CT CHEST ABDOMEN PELVIS W CONTRAST  Result Date: 01/22/2023 CLINICAL DATA:  Follow-up metastatic endometrial carcinoma. Undergoing chemotherapy. * Tracking Code: BO * EXAM: CT CHEST, ABDOMEN, AND PELVIS WITH CONTRAST TECHNIQUE: Multidetector CT imaging of the chest, abdomen and pelvis was performed following the standard protocol during bolus administration of intravenous contrast. RADIATION DOSE REDUCTION: This exam was performed according to the departmental dose-optimization program which includes automated exposure control, adjustment of the mA and/or kV according to patient size and/or use of iterative reconstruction technique. CONTRAST:  80mL OMNIPAQUE IOHEXOL 300 MG/ML  SOLN COMPARISON:  10/24/2022 FINDINGS: CT CHEST FINDINGS Cardiovascular: No acute findings. Mediastinum/Lymph Nodes: Mediastinal and left hilar lymphadenopathy is decreased since previous study.  Precarinal lymph node currently measures 11 mm on image 26/2 compared to 15 mm previously. Previously seen left hilar lymphadenopathy is no longer visualized. No new or increased sites of lymphadenopathy identified. Lungs/Pleura: Previously seen pulmonary nodule in the central left upper lobe has resolved since prior study. Other nodule in the superior segment of the right lower lobe has also resolved. No suspicious pulmonary nodules or masses identified. No evidence of infiltrate or pleural effusion. Musculoskeletal:  No suspicious bone lesions identified. CT ABDOMEN AND PELVIS FINDINGS Hepatobiliary: No suspicious liver lesions seen on today's exam. Previously suspected small lesion in the liver dome is no longer visualized. Gallbladder is unremarkable. No evidence of biliary ductal dilatation. Pancreas:  No mass or inflammatory changes. Spleen:  Within normal limits in size and appearance. Adrenals/Urinary tract: No suspicious masses or hydronephrosis. Chronic right renal atrophy remains stable. Stomach/Bowel: No evidence of obstruction, inflammatory process, or abnormal fluid collections. Vascular/Lymphatic: No pathologically enlarged lymph nodes identified. No acute vascular findings. Aortic atherosclerotic calcification incidentally noted. Reproductive: Prior hysterectomy noted. Vaginal cuff is unremarkable in appearance. No pelvic mass identified. Adnexal regions are unremarkable in appearance. Other:  None. Musculoskeletal:  No suspicious bone lesions identified. IMPRESSION: Resolution of pulmonary metastases since prior exam. Interval decrease in mediastinal and left hilar lymphadenopathy. Previously suspected small liver lesion and other subtle areas of soft tissue nodularity in the abdomen and pelvis are no longer visualized. No new or progressive disease. 1 Aortic Atherosclerosis (ICD10-I70.0). Electronically Signed   By: Danae Orleans M.D.   On: 01/22/2023 15:27      Metastasis to lung ([name])   01/28/2022 Initial Diagnosis   Metastasis to lung ([name])   11/06/2022 -  Chemotherapy   Patient is on Treatment Plan : UTERINE ENDOMETRIAL Dostarlimab-gxly (500 mg) + Carboplatin (AUC 5) + Paclitaxel (175 mg/m2) q21d x 6 cycles / Dostarlimab-gxly (1000 mg) q42d x 6 cycles        PHYSICAL EXAMINATION: ECOG PERFORMANCE STATUS: 1 - Symptomatic but  completely ambulatory  There were no vitals filed for this visit. There were no vitals filed for this visit.  GENERAL:alert, no distress and comfortable  NEURO: alert & oriented x 3 with fluent speech, no focal motor/sensory deficits  LABORATORY DATA:  I have reviewed the data as listed    Component Value Date/Time   NA 137 03/27/2023 1008   NA 141 02/25/2019 1222   K 4.1 03/27/2023 1008   CL 106 03/27/2023 1008   CO2 26 03/27/2023 1008   GLUCOSE 158 (H) 03/27/2023 1008   BUN 24 (H) 03/27/2023 1008   BUN 17 02/25/2019 1222   CREATININE 1.17 (H) 03/27/2023 1008   CALCIUM 8.8 (L) 03/27/2023 1008   PROT 7.7 03/27/2023 1008   PROT 6.9 09/22/2019 1419   ALBUMIN 3.6 03/27/2023 1008   ALBUMIN 3.9 09/22/2019 1419   AST 12 (L) 03/27/2023 1008   ALT 7 03/27/2023 1008   ALKPHOS 49 03/27/2023 1008   BILITOT 0.4 03/27/2023 1008   GFRNONAA 54 (L) 03/27/2023 1008   GFRAA >60 05/16/2019 0335    No results found for: "SPEP", "UPEP"  Lab Results  Component Value Date   WBC 5.9 04/17/2023   NEUTROABS 3.6 04/17/2023   HGB 8.9 (L) 04/17/2023   HCT 27.3 (L) 04/17/2023   MCV 91.0 04/17/2023   PLT 157 04/17/2023      Chemistry      Component Value Date/Time   NA 137 03/27/2023 1008   NA 141 02/25/2019 1222   K 4.1 03/27/2023 1008   CL 106 03/27/2023 1008   CO2 26 03/27/2023 1008   BUN 24 (H) 03/27/2023 1008   BUN 17 02/25/2019 1222   CREATININE 1.17 (H) 03/27/2023 1008      Component Value Date/Time   CALCIUM 8.8 (L) 03/27/2023 1008   ALKPHOS 49 03/27/2023 1008   AST 12 (L) 03/27/2023 1008   ALT 7 03/27/2023 1008   BILITOT 0.4  03/27/2023 1008

## 2023-04-17 NOTE — Patient Instructions (Signed)
[facility]  Discharge Instructions: Thank you for choosing [facility] to provide your oncology and hematology care.   If you have a lab appointment with the Cancer Center, please go directly to the Cancer Center and check in at the registration area.   Wear comfortable clothing and clothing appropriate for easy access to any Portacath or PICC line.   We strive to give you quality time with your provider. You may need to reschedule your appointment if you arrive late (15 or more minutes).  Arriving late affects you and other patients whose appointments are after yours.  Also, if you miss three or more appointments without notifying the office, you may be dismissed from the clinic at the provider's discretion.      For prescription refill requests, have your pharmacy contact our office and allow 72 hours for refills to be completed.    Today you received the following chemotherapy and/or immunotherapy agents Jemperli To help prevent nausea and vomiting after your treatment, we encourage you to take your nausea medication as directed.  BELOW ARE SYMPTOMS THAT SHOULD BE REPORTED IMMEDIATELY: *FEVER GREATER THAN 100.4 F (38 C) OR HIGHER *CHILLS OR SWEATING *NAUSEA AND VOMITING THAT IS NOT CONTROLLED WITH YOUR NAUSEA MEDICATION *UNUSUAL SHORTNESS OF BREATH *UNUSUAL BRUISING OR BLEEDING *URINARY PROBLEMS (pain or burning when urinating, or frequent urination) *BOWEL PROBLEMS (unusual diarrhea, constipation, pain near the anus) TENDERNESS IN MOUTH AND THROAT WITH OR WITHOUT PRESENCE OF ULCERS (sore throat, sores in mouth, or a toothache) UNUSUAL RASH, SWELLING OR PAIN  UNUSUAL VAGINAL DISCHARGE OR ITCHING   Items with * indicate a potential emergency and should be followed up as soon as possible or go to the Emergency Department if any problems should occur.  Please show the CHEMOTHERAPY ALERT CARD or IMMUNOTHERAPY ALERT CARD at check-in to  the Emergency Department and triage nurse.  Should you have questions after your visit or need to cancel or reschedule your appointment, please contact [facility]  Dept: [phone]  and follow the prompts.  Office hours are 8:00 a.m. to 4:30 p.m. Monday - Friday. Please note that voicemails left after 4:00 p.m. may not be returned until the following business day.  We are closed weekends and major holidays. You have access to a nurse at all times for urgent questions. Please call the main number to the clinic Dept: [phone] and follow the prompts.   For any non-urgent questions, you may also contact your provider using MyChart. We now offer e-Visits for anyone 18 and older to request care online for non-urgent symptoms. For details visit [email or web address].   Also download the MyChart app! Go to the app store, search "MyChart", open the app, select [facility], and log in with your MyChart username and password.  

## 2023-04-17 NOTE — Assessment & Plan Note (Signed)
Her blood pressure is poorly controlled Despite multiple different medication adjustment, she continues to have hypertension due to poor lifestyle choices I will add diuretic We discussed risk of dehydration with diuretic therapy and advised her to drink lots of water

## 2023-04-17 NOTE — Assessment & Plan Note (Signed)
Her last CT imaging showed positive response to therapy with resolution of pulmonary metastasis The plan will be to continue treatment  We discussed importance of blood pressure and blood sugar control while on treatment I plan to space out imaging study.  I will order it after her treatment

## 2023-04-17 NOTE — Assessment & Plan Note (Signed)
She has intermittent acute on chronic renal failure We discussed importance of hydration and risk factor modification She can proceed as long as creatinine is less than 2 

## 2023-04-17 NOTE — Assessment & Plan Note (Signed)
This is likely due to recent treatment. The patient denies recent history of bleeding such as epistaxis, hematuria or hematochezia. She is asymptomatic from the anemia. I will observe for now.  She does not require transfusion now. I will continue the chemotherapy at current dose without dosage adjustment.  If the anemia gets progressive worse in the future, I might have to delay her treatment or adjust the chemotherapy dose.  

## 2023-04-18 LAB — T4: T4, Total: 6.7 ug/dL (ref 4.5–12.0)

## 2023-05-13 ENCOUNTER — Other Ambulatory Visit: Payer: Self-pay | Admitting: Hematology and Oncology

## 2023-05-14 ENCOUNTER — Encounter: Payer: Self-pay | Admitting: Hematology and Oncology

## 2023-05-29 ENCOUNTER — Inpatient Hospital Stay: Payer: Medicare HMO

## 2023-05-29 ENCOUNTER — Inpatient Hospital Stay (HOSPITAL_BASED_OUTPATIENT_CLINIC_OR_DEPARTMENT_OTHER): Payer: Medicare HMO | Admitting: Hematology and Oncology

## 2023-05-29 ENCOUNTER — Inpatient Hospital Stay: Payer: Medicare HMO | Attending: Hematology and Oncology

## 2023-05-29 ENCOUNTER — Encounter: Payer: Self-pay | Admitting: Hematology and Oncology

## 2023-05-29 DIAGNOSIS — C541 Malignant neoplasm of endometrium: Secondary | ICD-10-CM | POA: Diagnosis present

## 2023-05-29 DIAGNOSIS — Z79620 Long term (current) use of immunosuppressive biologic: Secondary | ICD-10-CM | POA: Insufficient documentation

## 2023-05-29 DIAGNOSIS — C7802 Secondary malignant neoplasm of left lung: Secondary | ICD-10-CM

## 2023-05-29 DIAGNOSIS — C7801 Secondary malignant neoplasm of right lung: Secondary | ICD-10-CM | POA: Diagnosis not present

## 2023-05-29 DIAGNOSIS — Z79899 Other long term (current) drug therapy: Secondary | ICD-10-CM | POA: Insufficient documentation

## 2023-05-29 DIAGNOSIS — Z5112 Encounter for antineoplastic immunotherapy: Secondary | ICD-10-CM | POA: Diagnosis present

## 2023-05-29 LAB — CBC WITH DIFFERENTIAL (CANCER CENTER ONLY)
Abs Immature Granulocytes: 0.01 10*3/uL (ref 0.00–0.07)
Basophils Absolute: 0 10*3/uL (ref 0.0–0.1)
Basophils Relative: 0 %
Eosinophils Absolute: 0.2 10*3/uL (ref 0.0–0.5)
Eosinophils Relative: 4 %
HCT: 34.6 % — ABNORMAL LOW (ref 36.0–46.0)
Hemoglobin: 11.1 g/dL — ABNORMAL LOW (ref 12.0–15.0)
Immature Granulocytes: 0 %
Lymphocytes Relative: 47 %
Lymphs Abs: 2.1 10*3/uL (ref 0.7–4.0)
MCH: 29.4 pg (ref 26.0–34.0)
MCHC: 32.1 g/dL (ref 30.0–36.0)
MCV: 91.8 fL (ref 80.0–100.0)
Monocytes Absolute: 0.5 10*3/uL (ref 0.1–1.0)
Monocytes Relative: 11 %
Neutro Abs: 1.7 10*3/uL (ref 1.7–7.7)
Neutrophils Relative %: 38 %
Platelet Count: 195 10*3/uL (ref 150–400)
RBC: 3.77 MIL/uL — ABNORMAL LOW (ref 3.87–5.11)
RDW: 17.4 % — ABNORMAL HIGH (ref 11.5–15.5)
WBC Count: 4.5 10*3/uL (ref 4.0–10.5)
nRBC: 0 % (ref 0.0–0.2)

## 2023-05-29 LAB — CMP (CANCER CENTER ONLY)
ALT: 8 U/L (ref 0–44)
AST: 15 U/L (ref 15–41)
Albumin: 3.8 g/dL (ref 3.5–5.0)
Alkaline Phosphatase: 45 U/L (ref 38–126)
Anion gap: 6 (ref 5–15)
BUN: 26 mg/dL — ABNORMAL HIGH (ref 6–20)
CO2: 28 mmol/L (ref 22–32)
Calcium: 9.4 mg/dL (ref 8.9–10.3)
Chloride: 105 mmol/L (ref 98–111)
Creatinine: 1.37 mg/dL — ABNORMAL HIGH (ref 0.44–1.00)
GFR, Estimated: 44 mL/min — ABNORMAL LOW (ref >60)
Glucose, Bld: 138 mg/dL — ABNORMAL HIGH (ref 70–99)
Potassium: 3.5 mmol/L (ref 3.5–5.1)
Sodium: 139 mmol/L (ref 135–145)
Total Bilirubin: 0.5 mg/dL (ref 0.3–1.2)
Total Protein: 7.9 g/dL (ref 6.5–8.1)

## 2023-05-29 LAB — TSH: TSH: 1.352 u[IU]/mL (ref 0.350–4.500)

## 2023-05-29 MED ORDER — SODIUM CHLORIDE 0.9 % IV SOLN: Freq: Once | INTRAVENOUS | Status: AC

## 2023-05-29 MED ORDER — SODIUM CHLORIDE 0.9% FLUSH
10.0000 mL | Freq: Once | INTRAVENOUS | Status: AC
  Administered 2023-05-29: 10 mL

## 2023-05-29 MED ORDER — HEPARIN SOD (PORK) LOCK FLUSH 100 UNIT/ML IV SOLN
500.0000 [IU] | Freq: Once | INTRAVENOUS | Status: AC | PRN
  Administered 2023-05-29: 500 [IU]

## 2023-05-29 MED ORDER — SODIUM CHLORIDE 0.9 % IV SOLN
1000.0000 mg | Freq: Once | INTRAVENOUS | Status: AC
  Administered 2023-05-29: 1000 mg via INTRAVENOUS
  Filled 2023-05-29: qty 20

## 2023-05-29 MED ORDER — DOXAZOSIN MESYLATE 4 MG PO TABS: 4.0000 mg | ORAL_TABLET | Freq: Every day | ORAL | 3 refills | Status: DC

## 2023-05-29 MED ORDER — SODIUM CHLORIDE 0.9% FLUSH
10.0000 mL | INTRAVENOUS | Status: DC | PRN
  Administered 2023-05-29: 10 mL

## 2023-05-29 NOTE — Progress Notes (Signed)
[facility] OFFICE PROGRESS NOTE  Patient Care Team: Verlon Au, MD as PCP - General (Family Medicine) Runell Gess, MD as PCP - Cardiology (Cardiology) Artis Delay, MD as Consulting Physician (Hematology and Oncology)  HISTORY OF PRESENTING ILLNESS: Discussed the use of AI scribe software for clinical note transcription with the patient, who gave verbal consent to proceed.  History of Present Illness   The patient, currently on immunotherapy for recurrent uterine cancer, has been tolerating the treatment well with no reported side effects. However, she has been experiencing persistently high blood pressure, despite the addition of a new antihypertensive medication during her last visit. The patient has been monitoring her blood pressure at home, with recent readings of 160/108. She confirms adherence to her prescribed antihypertensive medications and denies excessive salt intake.  In addition to her current regimen of hydrochlorothiazide and metoprolol taken in the morning, she has been prescribed losartan and Cardura to be taken in the evening. She also mentions a medication, taken twice daily before chemotherapy, which she has run out of. However, she has been informed that this medication is no longer necessary as she is now only on immunotherapy. She has not reached out to PCP for management  The patient's last CT scan was performed in June. Her recent blood work has shown improvement, with her hemoglobin level increasing from 8.9 to 11 since her last visit.         Assessment and Plan    Metastatic uterine cancer Patient is currently on immunotherapy. No reported side effects from chemotherapy.   -Discontinue the pills taken twice daily as she is no longer needed with immunotherapy.   -Schedule a CT scan for November 19th, a week before the next appointment.      Hypertension, poorly controlled   Persistent elevated blood pressure despite current  medications. Patient is adherent to medications and reports low salt diet.   I recommend Losartan to be taken in the evening.   -Continue Hydrochlorothiazide and Metoprolol in the morning, and add Cardura in the evening.   -Prescription to be sent to [facility] [address].    Anemia   Improvement in hemoglobin levels from 8.9 to 11 since the last visit.   -Continue current treatment plan.    Follow-up in one month.          Orders Placed This Encounter  Procedures   CT CHEST ABDOMEN PELVIS W CONTRAST    Standing Status:   Future    Standing Expiration Date:   05/28/2024    Scheduling Instructions:     No need oral contrast    Order Specific Question:   If indicated for the ordered procedure, I authorize the administration of contrast media per Radiology protocol    Answer:   Yes    Order Specific Question:   Does the patient have a contrast media/X-ray dye allergy?    Answer:   No    Order Specific Question:   Preferred imaging location?    Answer:   [facility]    Order Specific Question:   If indicated for the ordered procedure, I authorize the administration of oral contrast media per Radiology protocol    Answer:   Yes    Order Specific Question:   Is patient pregnant?    Answer:   No    All questions were answered. The patient knows to call the clinic with any problems, questions or concerns. The total time spent in  the appointment was 30 minutes encounter with patients including review of chart and various tests results, discussions about plan of care and coordination of care plan   Artis Delay, MD 05/29/2023 11:47 AM  REVIEW OF SYSTEMS:  All other systems were reviewed with the patient and are negative.  I have reviewed the past medical history, past surgical history, social history and family history with the patient and they are unchanged from previous note.  ALLERGIES:  is allergic to statins, agrostis alba pollen extract [gramineae pollens], and  zetia [ezetimibe].  MEDICATIONS:  Current Outpatient Medications  Medication Sig Dispense Refill   doxazosin (CARDURA) 4 MG tablet Take 1 tablet (4 mg total) by mouth daily. 30 tablet 3   acetaminophen (TYLENOL) 500 MG tablet Take 1,000 mg by mouth every 6 (six) hours as needed for mild pain or headache.     aspirin EC 81 MG tablet Take 1 tablet (81 mg total) by mouth daily. Swallow whole.     Evolocumab (REPATHA SURECLICK) 140 MG/ML SOAJ Inject 140 mg into the skin every 14 (fourteen) days. 2 pen 11   hydrochlorothiazide (HYDRODIURIL) 25 MG tablet Take 1 tablet (25 mg total) by mouth daily. 30 tablet 3   lidocaine-prilocaine (EMLA) cream Apply 1 application topically as needed. 30 g 0   loratadine (CLARITIN) 10 MG tablet Take 1 tablet (10 mg total) by mouth daily as needed for allergies. 30 tablet 11   losartan (COZAAR) 100 MG tablet Take 1 tablet by mouth once daily 30 tablet 0   metFORMIN (GLUCOPHAGE) 1000 MG tablet Take 1,000 mg by mouth 2 (two) times daily with a meal.     metoprolol succinate (TOPROL XL) 100 MG 24 hr tablet Take 1 tablet (100 mg total) by mouth daily. 30 tablet 3   nitroGLYCERIN (NITROSTAT) 0.4 MG SL tablet Place 1 tablet (0.4 mg total) under the tongue every 5 (five) minutes as needed for chest pain. 30 tablet 2   ondansetron (ZOFRAN) 8 MG tablet Take 1 tablet (8 mg total) by mouth every 8 (eight) hours as needed for nausea. 30 tablet 3   polyethylene glycol (MIRALAX) 17 g packet Take 17 g by mouth daily. 30 each 1   prochlorperazine (COMPAZINE) 10 MG tablet Take 1 tablet (10 mg total) by mouth every 6 (six) hours as needed for nausea or vomiting. 30 tablet 0   senna-docusate (SENOKOT-S) 8.6-50 MG tablet Take 2 tablets by mouth 2 (two) times daily. 90 tablet 1   No current facility-administered medications for this visit.    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    12/14/2020 Imaging   CT chest abdomen and pelvis IMPRESSION:  1.  2 small nodules in the right lung, nonspecific.  2.  Prominent precarinal lymph node versus adjacent small nodes.  3.  Enlarged heterogeneous uterine fundus.  4.  No definite adenopathy in the abdomen or pelvis. Nonspecific small lymph nodes are present, not enlarged by CT criteria.  5.  Mild wall thickening of the anterior aspect of the urinary bladder which may be due to to nondistention or inflammation.    12/27/2020 Pathology Results   SPECIMEN     Procedure:    Total hysterectomy and bilateral salpingo-oophorectomy   TUMOR  Histologic Type:    Serous carcinoma     Histologic Grade:    Not applicable     Myometrial Invasion:    Present       Depth of Myometrial Invasion:    11 mm       Myometrial Thickness:    12 mm       Percentage of Myometrial Invasion:    92 %     Uterine Serosa Involvement:    Not identified     Cervical Stromal Involvement:    Not identified     Other Tissue / Organ Involvement:    Not identified     Peritoneal / Ascitic Fluid:    Not identified     Lymphovascular Invasion (LVI):    Present   MARGINS     Margin Status:    Not applicable   REGIONAL LYMPH NODES     Regional Lymph Node Status:           :    All regional lymph nodes negative for tumor cells         Lymph Nodes Examined:               Total Number of Pelvic Nodes Examined:    3           Number of Pelvic Sentinel Nodes Examined:    3           Total Number of Para-aortic Nodes Examined:    0           Number of Para-aortic Sentinel Nodes Examined:    Not applicable   DISTANT METASTASIS     Distant Site(s) Involved:    Not applicable   PATHOLOGIC STAGE CLASSIFICATION (pTNM, AJCC 8th Edition)     Reporting of pT, pN, and (when applicable) pM categories is based on information  available to the pathologist at the time the report is issued. As per the AJCC (Chapter 1, 8th Ed.) it is the managing physician's responsibility to establish the final pathologic stage based upon all pertinent information, including but potentially not limited to this pathology report.     TNM Descriptors:    Not applicable     Tumor Modifier:    Not applicable     pT Category:    pT1b     Regional Lymph Nodes Modifier:    (sn)     pN Category:    pN0   The endometrial adenocarcinoma was analyzed by IHC for DNA mismatch repair proteins.  The neoplasm retained nuclear expression of all four mismatch repair proteins, MLH1, PMS2, MSH2, MSH6.    Estrogen receptors (ER): Negative Progesterone receptors (PR): Negative  Controls worked appropriately.   A HER2 stain is POSITIVE (3+).   12/27/2020 Surgery   At [facility], she underwent a robotic hysterectomy/BSO, sentinel node dissection and partial omentectomy. She was found to have an enlarged uterus. An enlarged right pelvic sentinel node.    02/23/2021 Echocardiogram   Left Ventricle: Systolic function is normal. EF: 55-60%.    Left Ventricle: Doppler parameters consistent with mild diastolic  dysfunction and low to normal LA pressure.    Left Ventricle: There is mild concentric hypertrophy.    Aortic Valve: Mild aortic valve regurgitation.   01/27/2022 Imaging   CT chest imaging 1. No pulmonary embolus. 2. Findings consistent with intrathoracic malignancy. Multiple pulmonary nodules throughout both lungs of varying sizes, consistent with metastatic disease. Primary site of malignancy may represent a 4 cm  spiculated nodule in the right perihilar region spanning the fissure that is cavitating. Alternatively all of these nodules may be metastatic given history of cancer, type not specified.  3. Multifocal mediastinal and bilateral hilar adenopathy, consistent metastatic disease. 4. Nondisplaced left anterior seventh and eighth rib fractures, soft  tissue thickening adjacent to the seventh rib fracture suggests this may be pathologic 5. Right hydronephrosis is partially included in the field of view in the upper abdomen. Small soft tissue density medial to the right hepatic lobe posterior to the right kidney is nonspecific but may represent metastatic disease. Recommend staging CT of the abdomen and pelvis with oral and IV contrast. 6. Moderate emphysema. 7. Aortic atherosclerosis.  Coronary artery calcifications.   01/28/2022 Initial Diagnosis   Endometrial cancer ([name])   01/28/2022 Cancer Staging   Staging form: Corpus Uteri - Carcinoma and Carcinosarcoma, AJCC 8th Edition - Clinical stage from 01/28/2022: FIGO Stage IVB (rcT1b, cN2a, cM1) - Signed by Artis Delay, MD on 01/28/2022 Stage prefix: Recurrence   01/28/2022 Imaging   CT abdomen and pelvis 1. Multiple abdominal and pelvic metastatic implants as above. 2. Moderate right hydronephrosis secondary to mass effect and obstruction of the distal right ureter by a right pelvic metastatic implant. 3. Multiple pulmonary metastatic disease in the visualized right lung base. 4. No bowel obstruction. Normal appendix. 5. Aortic Atherosclerosis (ICD10-I70.0).   01/30/2022 Echocardiogram    1. Left ventricular ejection fraction, by estimation, is 55 to 60%. The left ventricle has normal function. The left ventricle has no regional wall motion abnormalities. There is moderate concentric left ventricular  hypertrophy. Left ventricular diastolic parameters are consistent with Grade I diastolic dysfunction (impaired relaxation).   2. Right ventricular systolic function is normal. The right ventricular size is normal. There is normal pulmonary artery systolic pressure.   3. Left atrial size was mildly dilated.   4. The mitral valve is normal in structure. Trivial mitral valve regurgitation. No evidence of mitral stenosis.   5. The aortic valve is tricuspid. Aortic valve regurgitation is trivial. No  aortic stenosis is present.   6. Aortic dilatation noted. There is mild dilatation of the aortic root, measuring 39 mm.   7. The inferior vena cava is normal in size with <50% respiratory variability, suggesting right atrial pressure of 8 mmHg.    01/31/2022 - 04/04/2022 Chemotherapy   Patient is on Treatment Plan : UTERINE SEROUS CARCINOMA Carboplatin + Paclitaxel + Trastuzumab q21d x 6 Cycles / Trastuzumab q21d     01/31/2022 - 05/18/2022 Chemotherapy   Patient is on Treatment Plan : UTERINE SEROUS CARCINOMA Carboplatin + Paclitaxel + Trastuzumab q21d x 6 Cycles / Trastuzumab q21d     04/03/2022 Imaging   1. Pulmonary nodules are either reduced in size or completely resolved. No new nodules. 2. Marked reduction in size peritoneal nodular metastasis. Multiple nodular lesions are no longer measurable. 3. Interval reduction in size of implant in the RIGHT operator space. While this implant is smaller, lesion continues to obstruct the RIGHT ureter with persistent RIGHT hydronephrosis and hydroureter. 4. No new metastatic lesions are present.   05/09/2022 Echocardiogram    1. Left ventricular ejection fraction, by estimation, is 45 to 50%. The left ventricle has mildly decreased function. The left ventricle demonstrates global hypokinesis. There is moderate concentric left ventricular hypertrophy. Left ventricular diastolic parameters are consistent with Grade I diastolic dysfunction (impaired relaxation). The average left ventricular global longitudinal strain is -10.8 %. The global longitudinal strain is abnormal.  2.  Right ventricular systolic function is normal. The right ventricular size is normal.  3. The mitral valve is normal in structure. No evidence of mitral valve regurgitation.  4. The aortic valve is tricuspid. Aortic valve regurgitation is mild.  5. There is mild dilatation of the ascending aorta, measuring 39 mm.  6. The inferior vena cava is normal in size with <50% respiratory  variability, suggesting right atrial pressure of 8 mmHg.   07/24/2022 Imaging   1. Improving appearance of sequela of metastatic disease. RIGHT pelvic sidewall mass obstructing the RIGHT ureter is no longer clearly seen. LEFT retroperitoneal soft tissue has diminished. 2. No signs of pulmonary metastatic disease or metastatic disease to the chest. 3. Mild RIGHT hydroureter without hydronephrosis. Resolution of hydronephrosis but with further atrophy of the RIGHT kidney. 4. Aortic atherosclerosis and 3 vessel coronary artery disease.   Aortic Atherosclerosis (ICD10-I70.0).   10/25/2022 Imaging   1. New pathologically enlarged mediastinal and left hilar lymph nodes as well as redemonstration previously treated/resolved pulmonary nodules, consistent with thoracic metastatic disease. Consider further evaluation by nuclear medicine PET-CT. 2. New subtle 14 mm hypodense lesion in the hepatic dome with increased conspicuity of a previously treated hepatic metastatic lesion in the right lobe of the liver, suspicious for new/recurrent hepatic metastatic disease. 3. Interval increase in the soft tissue thickening adjacent to loops of bowel in the right lower quadrant near the right external iliac artery, suspicious for recurrent disease. 4. Slight interval decrease in size of the left retroperitoneal soft tissue nodule. 5. Small focus of soft tissue nodularity along the left vaginal cuff is similar prior. 6.  Aortic Atherosclerosis (ICD10-I70.0).   11/06/2022 -  Chemotherapy   Patient is on Treatment Plan : UTERINE ENDOMETRIAL Dostarlimab-gxly (500 mg) + Carboplatin (AUC 5) + Paclitaxel (175 mg/m2) q21d x 6 cycles / Dostarlimab-gxly (1000 mg) q42d x 6 cycles      01/23/2023 Imaging   CT CHEST ABDOMEN PELVIS W CONTRAST  Result Date: 01/22/2023 CLINICAL DATA:  Follow-up metastatic endometrial carcinoma. Undergoing chemotherapy. * Tracking Code: BO * EXAM: CT CHEST, ABDOMEN, AND PELVIS WITH CONTRAST  TECHNIQUE: Multidetector CT imaging of the chest, abdomen and pelvis was performed following the standard protocol during bolus administration of intravenous contrast. RADIATION DOSE REDUCTION: This exam was performed according to the departmental dose-optimization program which includes automated exposure control, adjustment of the mA and/or kV according to patient size and/or use of iterative reconstruction technique. CONTRAST:  80mL OMNIPAQUE IOHEXOL 300 MG/ML  SOLN COMPARISON:  10/24/2022 FINDINGS: CT CHEST FINDINGS Cardiovascular: No acute findings. Mediastinum/Lymph Nodes: Mediastinal and left hilar lymphadenopathy is decreased since previous study. Precarinal lymph node currently measures 11 mm on image 26/2 compared to 15 mm previously. Previously seen left hilar lymphadenopathy is no longer visualized. No new or increased sites of lymphadenopathy identified. Lungs/Pleura: Previously seen pulmonary nodule in the central left upper lobe has resolved since prior study. Other nodule in the superior segment of the right lower lobe has also resolved. No suspicious pulmonary nodules or masses identified. No evidence of infiltrate or pleural effusion. Musculoskeletal:  No suspicious bone lesions identified. CT ABDOMEN AND PELVIS FINDINGS Hepatobiliary: No suspicious liver lesions seen on today's exam. Previously suspected small lesion in the liver dome is no longer visualized. Gallbladder is unremarkable. No evidence of biliary ductal dilatation. Pancreas:  No mass or inflammatory changes. Spleen:  Within normal limits in size and appearance. Adrenals/Urinary tract: No suspicious masses or hydronephrosis. Chronic right renal atrophy remains stable. Stomach/Bowel: No  evidence of obstruction, inflammatory process, or abnormal fluid collections. Vascular/Lymphatic: No pathologically enlarged lymph nodes identified. No acute vascular findings. Aortic atherosclerotic calcification incidentally noted. Reproductive: Prior  hysterectomy noted. Vaginal cuff is unremarkable in appearance. No pelvic mass identified. Adnexal regions are unremarkable in appearance. Other:  None. Musculoskeletal:  No suspicious bone lesions identified. IMPRESSION: Resolution of pulmonary metastases since prior exam. Interval decrease in mediastinal and left hilar lymphadenopathy. Previously suspected small liver lesion and other subtle areas of soft tissue nodularity in the abdomen and pelvis are no longer visualized. No new or progressive disease. 1 Aortic Atherosclerosis (ICD10-I70.0). Electronically Signed   By: Danae Orleans M.D.   On: 01/22/2023 15:27      Metastasis to lung ([name])  01/28/2022 Initial Diagnosis   Metastasis to lung ([name])   11/06/2022 -  Chemotherapy   Patient is on Treatment Plan : UTERINE ENDOMETRIAL Dostarlimab-gxly (500 mg) + Carboplatin (AUC 5) + Paclitaxel (175 mg/m2) q21d x 6 cycles / Dostarlimab-gxly (1000 mg) q42d x 6 cycles        PHYSICAL EXAMINATION: ECOG PERFORMANCE STATUS: 1 - Symptomatic but completely ambulatory  Vitals:   05/29/23 1115  BP: (!) 166/104  Pulse: 69  Resp: 18  Temp: (!) 97.4 F (36.3 C)  SpO2: 97%   Filed Weights   05/29/23 1115  Weight: 146 lb 12.8 oz (66.6 kg)    GENERAL:alert, no distress and comfortable  LABORATORY DATA:  I have reviewed the data as listed    Component Value Date/Time   NA 138 04/17/2023 1209   NA 141 02/25/2019 1222   K 3.8 04/17/2023 1209   CL 106 04/17/2023 1209   CO2 27 04/17/2023 1209   GLUCOSE 137 (H) 04/17/2023 1209   BUN 21 (H) 04/17/2023 1209   BUN 17 02/25/2019 1222   CREATININE 1.24 (H) 04/17/2023 1209   CALCIUM 8.5 (L) 04/17/2023 1209   PROT 7.1 04/17/2023 1209   PROT 6.9 09/22/2019 1419   ALBUMIN 3.5 04/17/2023 1209   ALBUMIN 3.9 09/22/2019 1419   AST 15 04/17/2023 1209   ALT 11 04/17/2023 1209   ALKPHOS 48 04/17/2023 1209   BILITOT 0.4 04/17/2023 1209   GFRNONAA 50 (L) 04/17/2023 1209   GFRAA >60 05/16/2019 0335    No  results found for: "SPEP", "UPEP"  Lab Results  Component Value Date   WBC 4.5 05/29/2023   NEUTROABS 1.7 05/29/2023   HGB 11.1 (L) 05/29/2023   HCT 34.6 (L) 05/29/2023   MCV 91.8 05/29/2023   PLT 195 05/29/2023      Chemistry      Component Value Date/Time   NA 138 04/17/2023 1209   NA 141 02/25/2019 1222   K 3.8 04/17/2023 1209   CL 106 04/17/2023 1209   CO2 27 04/17/2023 1209   BUN 21 (H) 04/17/2023 1209   BUN 17 02/25/2019 1222   CREATININE 1.24 (H) 04/17/2023 1209      Component Value Date/Time   CALCIUM 8.5 (L) 04/17/2023 1209   ALKPHOS 48 04/17/2023 1209   AST 15 04/17/2023 1209   ALT 11 04/17/2023 1209   BILITOT 0.4 04/17/2023 1209       RADIOGRAPHIC STUDIES: I have personally reviewed the radiological images as listed and agreed with the findings in the report. No results found.

## 2023-05-29 NOTE — Patient Instructions (Signed)
[name]  Discharge Instructions: Thank you for choosing [facility] to provide your oncology and hematology care.   If you have a lab appointment with the Cancer Center, please go directly to the Cancer Center and check in at the registration area.   Wear comfortable clothing and clothing appropriate for easy access to any Portacath or PICC line.   We strive to give you quality time with your provider. You may need to reschedule your appointment if you arrive late (15 or more minutes).  Arriving late affects you and other patients whose appointments are after yours.  Also, if you miss three or more appointments without notifying the office, you may be dismissed from the clinic at the provider's discretion.      For prescription refill requests, have your pharmacy contact our office and allow 72 hours for refills to be completed.    Today you received the following chemotherapy and/or immunotherapy agents Jemperli To help prevent nausea and vomiting after your treatment, we encourage you to take your nausea medication as directed.  BELOW ARE SYMPTOMS THAT SHOULD BE REPORTED IMMEDIATELY: *FEVER GREATER THAN 100.4 F (38 C) OR HIGHER *CHILLS OR SWEATING *NAUSEA AND VOMITING THAT IS NOT CONTROLLED WITH YOUR NAUSEA MEDICATION *UNUSUAL SHORTNESS OF BREATH *UNUSUAL BRUISING OR BLEEDING *URINARY PROBLEMS (pain or burning when urinating, or frequent urination) *BOWEL PROBLEMS (unusual diarrhea, constipation, pain near the anus) TENDERNESS IN MOUTH AND THROAT WITH OR WITHOUT PRESENCE OF ULCERS (sore throat, sores in mouth, or a toothache) UNUSUAL RASH, SWELLING OR PAIN  UNUSUAL VAGINAL DISCHARGE OR ITCHING   Items with * indicate a potential emergency and should be followed up as soon as possible or go to the Emergency Department if any problems should occur.  Please show the CHEMOTHERAPY ALERT CARD or IMMUNOTHERAPY ALERT CARD at check-in to  the Emergency Department and triage nurse.  Should you have questions after your visit or need to cancel or reschedule your appointment, please contact [facility]  Dept: [phone]  and follow the prompts.  Office hours are 8:00 a.m. to 4:30 p.m. Monday - Friday. Please note that voicemails left after 4:00 p.m. may not be returned until the following business day.  We are closed weekends and major holidays. You have access to a nurse at all times for urgent questions. Please call the main number to the clinic Dept: [phone] and follow the prompts.   For any non-urgent questions, you may also contact your provider using MyChart. We now offer e-Visits for anyone 76 and older to request care online for non-urgent symptoms. For details visit [email or web address].   Also download the MyChart app! Go to the app store, search "MyChart", open the app, select [facility], and log in with your MyChart username and password.

## 2023-05-30 LAB — T4: T4, Total: 8.2 ug/dL (ref 4.5–12.0)

## 2023-07-03 ENCOUNTER — Ambulatory Visit (HOSPITAL_COMMUNITY)
Admission: RE | Admit: 2023-07-03 | Discharge: 2023-07-03 | Disposition: A | Discharge status: Home / Self Care | Payer: Medicare HMO | Source: Ambulatory Visit | Attending: Hematology and Oncology | Admitting: Hematology and Oncology

## 2023-07-03 DIAGNOSIS — C541 Malignant neoplasm of endometrium: Secondary | ICD-10-CM | POA: Insufficient documentation

## 2023-07-03 DIAGNOSIS — C7802 Secondary malignant neoplasm of left lung: Secondary | ICD-10-CM | POA: Insufficient documentation

## 2023-07-03 DIAGNOSIS — C7801 Secondary malignant neoplasm of right lung: Secondary | ICD-10-CM | POA: Diagnosis present

## 2023-07-03 MED ORDER — IOHEXOL 300 MG/ML  SOLN
30.0000 mL | Freq: Once | INTRAMUSCULAR | Status: AC | PRN
  Administered 2023-07-03: 30 mL via ORAL

## 2023-07-03 MED ORDER — IOHEXOL 300 MG/ML  SOLN
80.0000 mL | Freq: Once | INTRAMUSCULAR | Status: AC | PRN
  Administered 2023-07-03: 80 mL via INTRAVENOUS

## 2023-07-03 MED ORDER — HEPARIN SOD (PORK) LOCK FLUSH 100 UNIT/ML IV SOLN
500.0000 [IU] | Freq: Once | INTRAVENOUS | Status: AC
  Administered 2023-07-03: 500 [IU] via INTRAVENOUS

## 2023-07-03 MED ORDER — HEPARIN SOD (PORK) LOCK FLUSH 100 UNIT/ML IV SOLN
INTRAVENOUS | Status: AC
  Filled 2023-07-03: qty 5

## 2023-07-10 ENCOUNTER — Inpatient Hospital Stay (HOSPITAL_BASED_OUTPATIENT_CLINIC_OR_DEPARTMENT_OTHER): Payer: Medicare HMO | Admitting: Hematology and Oncology

## 2023-07-10 ENCOUNTER — Inpatient Hospital Stay: Payer: Medicare HMO

## 2023-07-10 ENCOUNTER — Encounter: Payer: Self-pay | Admitting: Hematology and Oncology

## 2023-07-10 ENCOUNTER — Inpatient Hospital Stay: Payer: Medicare HMO | Attending: Hematology and Oncology

## 2023-07-10 VITALS — BP 125/80 | HR 68 | Temp 98.0°F | Resp 18 | Ht 63.0 in | Wt 147.4 lb

## 2023-07-10 DIAGNOSIS — I10 Essential (primary) hypertension: Secondary | ICD-10-CM

## 2023-07-10 DIAGNOSIS — T451X5A Adverse effect of antineoplastic and immunosuppressive drugs, initial encounter: Secondary | ICD-10-CM

## 2023-07-10 DIAGNOSIS — C7801 Secondary malignant neoplasm of right lung: Secondary | ICD-10-CM

## 2023-07-10 DIAGNOSIS — Z79899 Other long term (current) drug therapy: Secondary | ICD-10-CM | POA: Insufficient documentation

## 2023-07-10 DIAGNOSIS — C7802 Secondary malignant neoplasm of left lung: Secondary | ICD-10-CM

## 2023-07-10 DIAGNOSIS — C541 Malignant neoplasm of endometrium: Secondary | ICD-10-CM | POA: Insufficient documentation

## 2023-07-10 DIAGNOSIS — Z5112 Encounter for antineoplastic immunotherapy: Secondary | ICD-10-CM | POA: Insufficient documentation

## 2023-07-10 DIAGNOSIS — Z79620 Long term (current) use of immunosuppressive biologic: Secondary | ICD-10-CM | POA: Insufficient documentation

## 2023-07-10 DIAGNOSIS — N183 Chronic kidney disease, stage 3 unspecified: Secondary | ICD-10-CM | POA: Diagnosis not present

## 2023-07-10 DIAGNOSIS — D6481 Anemia due to antineoplastic chemotherapy: Secondary | ICD-10-CM

## 2023-07-10 LAB — CBC WITH DIFFERENTIAL (CANCER CENTER ONLY)
Abs Immature Granulocytes: 0.02 10*3/uL (ref 0.00–0.07)
Basophils Absolute: 0 10*3/uL (ref 0.0–0.1)
Basophils Relative: 0 %
Eosinophils Absolute: 0.1 10*3/uL (ref 0.0–0.5)
Eosinophils Relative: 3 %
HCT: 33.9 % — ABNORMAL LOW (ref 36.0–46.0)
Hemoglobin: 11 g/dL — ABNORMAL LOW (ref 12.0–15.0)
Immature Granulocytes: 0 %
Lymphocytes Relative: 52 %
Lymphs Abs: 2.5 10*3/uL (ref 0.7–4.0)
MCH: 29 pg (ref 26.0–34.0)
MCHC: 32.4 g/dL (ref 30.0–36.0)
MCV: 89.4 fL (ref 80.0–100.0)
Monocytes Absolute: 0.5 10*3/uL (ref 0.1–1.0)
Monocytes Relative: 10 %
Neutro Abs: 1.7 10*3/uL (ref 1.7–7.7)
Neutrophils Relative %: 35 %
Platelet Count: 169 10*3/uL (ref 150–400)
RBC: 3.79 MIL/uL — ABNORMAL LOW (ref 3.87–5.11)
RDW: 15 % (ref 11.5–15.5)
WBC Count: 4.7 10*3/uL (ref 4.0–10.5)
nRBC: 0 % (ref 0.0–0.2)

## 2023-07-10 LAB — CMP (CANCER CENTER ONLY)
ALT: 10 U/L (ref 0–44)
AST: 14 U/L — ABNORMAL LOW (ref 15–41)
Albumin: 3.6 g/dL (ref 3.5–5.0)
Alkaline Phosphatase: 41 U/L (ref 38–126)
Anion gap: 4 — ABNORMAL LOW (ref 5–15)
BUN: 33 mg/dL — ABNORMAL HIGH (ref 6–20)
CO2: 28 mmol/L (ref 22–32)
Calcium: 8.8 mg/dL — ABNORMAL LOW (ref 8.9–10.3)
Chloride: 105 mmol/L (ref 98–111)
Creatinine: 1.64 mg/dL — ABNORMAL HIGH (ref 0.44–1.00)
GFR, Estimated: 36 mL/min — ABNORMAL LOW (ref >60)
Glucose, Bld: 143 mg/dL — ABNORMAL HIGH (ref 70–99)
Potassium: 3.8 mmol/L (ref 3.5–5.1)
Sodium: 137 mmol/L (ref 135–145)
Total Bilirubin: 0.5 mg/dL (ref <1.2)
Total Protein: 7.1 g/dL (ref 6.5–8.1)

## 2023-07-10 LAB — TSH: TSH: 1.435 u[IU]/mL (ref 0.350–4.500)

## 2023-07-10 MED ORDER — LOSARTAN POTASSIUM 100 MG PO TABS: 100.0000 mg | ORAL_TABLET | Freq: Every day | ORAL | 0 refills | Status: DC

## 2023-07-10 MED ORDER — SODIUM CHLORIDE 0.9 % IV SOLN: Freq: Once | INTRAVENOUS | Status: AC

## 2023-07-10 MED ORDER — HEPARIN SOD (PORK) LOCK FLUSH 100 UNIT/ML IV SOLN
500.0000 [IU] | Freq: Once | INTRAVENOUS | Status: AC | PRN
  Administered 2023-07-10: 500 [IU]

## 2023-07-10 MED ORDER — SODIUM CHLORIDE 0.9% FLUSH
10.0000 mL | Freq: Once | INTRAVENOUS | Status: AC
  Administered 2023-07-10: 10 mL

## 2023-07-10 MED ORDER — SODIUM CHLORIDE 0.9 % IV SOLN
1000.0000 mg | Freq: Once | INTRAVENOUS | Status: AC
  Administered 2023-07-10: 1000 mg via INTRAVENOUS
  Filled 2023-07-10: qty 20

## 2023-07-10 MED ORDER — SODIUM CHLORIDE 0.9% FLUSH
10.0000 mL | INTRAVENOUS | Status: DC | PRN
  Administered 2023-07-10: 10 mL

## 2023-07-10 NOTE — Assessment & Plan Note (Signed)
Her blood pressure is better controlled She will continue current antihypertensives

## 2023-07-10 NOTE — Patient Instructions (Signed)
[facility] - A DEPT OF [facility]  Discharge Instructions: Thank you for choosing [facility] to provide your oncology and hematology care.   If you have a lab appointment with the Cancer Center, please go directly to the Cancer Center and check in at the registration area.   Wear comfortable clothing and clothing appropriate for easy access to any Portacath or PICC line.   We strive to give you quality time with your provider. You may need to reschedule your appointment if you arrive late (15 or more minutes).  Arriving late affects you and other patients whose appointments are after yours.  Also, if you miss three or more appointments without notifying the office, you may be dismissed from the clinic at the provider's discretion.      For prescription refill requests, have your pharmacy contact our office and allow 72 hours for refills to be completed.    Today you received the following chemotherapy and/or immunotherapy agents Jemperli.      To help prevent nausea and vomiting after your treatment, we encourage you to take your nausea medication as directed.  BELOW ARE SYMPTOMS THAT SHOULD BE REPORTED IMMEDIATELY: *FEVER GREATER THAN 100.4 F (38 C) OR HIGHER *CHILLS OR SWEATING *NAUSEA AND VOMITING THAT IS NOT CONTROLLED WITH YOUR NAUSEA MEDICATION *UNUSUAL SHORTNESS OF BREATH *UNUSUAL BRUISING OR BLEEDING *URINARY PROBLEMS (pain or burning when urinating, or frequent urination) *BOWEL PROBLEMS (unusual diarrhea, constipation, pain near the anus) TENDERNESS IN MOUTH AND THROAT WITH OR WITHOUT PRESENCE OF ULCERS (sore throat, sores in mouth, or a toothache) UNUSUAL RASH, SWELLING OR PAIN  UNUSUAL VAGINAL DISCHARGE OR ITCHING   Items with * indicate a potential emergency and should be followed up as soon as possible or go to the Emergency Department if any problems should occur.  Please show the CHEMOTHERAPY ALERT CARD or IMMUNOTHERAPY  ALERT CARD at check-in to the Emergency Department and triage nurse.  Should you have questions after your visit or need to cancel or reschedule your appointment, please contact [facility] - A DEPT OF [facility]  Dept: [phone]  and follow the prompts.  Office hours are 8:00 a.m. to 4:30 p.m. Monday - Friday. Please note that voicemails left after 4:00 p.m. may not be returned until the following business day.  We are closed weekends and major holidays. You have access to a nurse at all times for urgent questions. Please call the main number to the clinic Dept: [phone] and follow the prompts.   For any non-urgent questions, you may also contact your provider using MyChart. We now offer e-Visits for anyone 50 and older to request care online for non-urgent symptoms. For details visit [email or web address].   Also download the MyChart app! Go to the app store, search "MyChart", open the app, select [place], and log in with your MyChart username and password.

## 2023-07-10 NOTE — Assessment & Plan Note (Signed)
She has quit smoking CT imaging showed no evidence of disease

## 2023-07-10 NOTE — Assessment & Plan Note (Signed)

## 2023-07-10 NOTE — Assessment & Plan Note (Signed)
She has intermittent acute on chronic renal failure We discussed importance of hydration and risk factor modification She can proceed as long as creatinine is less than 2

## 2023-07-10 NOTE — Progress Notes (Signed)
[facility] OFFICE PROGRESS NOTE  Patient Care Team: Verlon Au, MD as PCP - General (Family Medicine) Runell Gess, MD as PCP - Cardiology (Cardiology) Artis Delay, MD as Consulting Physician (Hematology and Oncology)  ASSESSMENT & PLAN:  Endometrial cancer [facility]) I have reviewed multiple CT imaging with the patient and her mother She has complete response to treatment with no signs of cancer recurrence We will continue maintenance immunotherapy I plan to repeat imaging study again in 4 months, due around March 2025  Metastasis to lung [facility]) She has quit smoking CT imaging showed no evidence of disease  HTN (hypertension) Her blood pressure is better controlled She will continue current antihypertensives  CKD (chronic kidney disease), stage III ([name]) She has intermittent acute on chronic renal failure We discussed importance of hydration and risk factor modification She can proceed as long as creatinine is less than 2  Anemia due to antineoplastic chemotherapy This is likely due to recent treatment. The patient denies recent history of bleeding such as epistaxis, hematuria or hematochezia. She is asymptomatic from the anemia. I will observe for now.  She does not require transfusion now. I will continue the chemotherapy at current dose without dosage adjustment.  If the anemia gets progressive worse in the future, I might have to delay her treatment or adjust the chemotherapy dose.   No orders of the defined types were placed in this encounter.   All questions were answered. The patient knows to call the clinic with any problems, questions or concerns. The total time spent in the appointment was 40 minutes encounter with patients including review of chart and various tests results, discussions about plan of care and coordination of care plan   Artis Delay, MD 07/10/2023 12:36 PM  INTERVAL HISTORY: Please see below for problem oriented charting. she  returns for treatment follow-up and review of CT imaging results She is doing well No side effects from treatment She is here accompanied by her mother She has quit smoking We spent some time reviewing CT imaging results and discussed next plan of care  REVIEW OF SYSTEMS:   Constitutional: Denies fevers, chills or abnormal weight loss Eyes: Denies blurriness of vision Ears, nose, mouth, throat, and face: Denies mucositis or sore throat Respiratory: Denies cough, dyspnea or wheezes Cardiovascular: Denies palpitation, chest discomfort or lower extremity swelling Gastrointestinal:  Denies nausea, heartburn or change in bowel habits Skin: Denies abnormal skin rashes Lymphatics: Denies new lymphadenopathy or easy bruising Neurological:Denies numbness, tingling or new weaknesses Behavioral/Psych: Mood is stable, no new changes  All other systems were reviewed with the patient and are negative.  I have reviewed the past medical history, past surgical history, social history and family history with the patient and they are unchanged from previous note.  ALLERGIES:  is allergic to statins, agrostis alba pollen extract [gramineae pollens], and zetia [ezetimibe].  MEDICATIONS:  Current Outpatient Medications  Medication Sig Dispense Refill   acetaminophen (TYLENOL) 500 MG tablet Take 1,000 mg by mouth every 6 (six) hours as needed for mild pain or headache.     aspirin EC 81 MG tablet Take 1 tablet (81 mg total) by mouth daily. Swallow whole.     doxazosin (CARDURA) 4 MG tablet Take 1 tablet (4 mg total) by mouth daily. 30 tablet 3   Evolocumab (REPATHA SURECLICK) 140 MG/ML SOAJ Inject 140 mg into the skin every 14 (fourteen) days. 2 pen 11   hydrochlorothiazide (HYDRODIURIL) 25 MG tablet Take 1 tablet (  25 mg total) by mouth daily. 30 tablet 3   lidocaine-prilocaine (EMLA) cream Apply 1 application topically as needed. 30 g 0   loratadine (CLARITIN) 10 MG tablet Take 1 tablet (10 mg total) by  mouth daily as needed for allergies. 30 tablet 11   losartan (COZAAR) 100 MG tablet Take 1 tablet (100 mg total) by mouth daily. 90 tablet 0   metFORMIN (GLUCOPHAGE) 1000 MG tablet Take 1,000 mg by mouth 2 (two) times daily with a meal.     metoprolol succinate (TOPROL XL) 100 MG 24 hr tablet Take 1 tablet (100 mg total) by mouth daily. 30 tablet 3   nitroGLYCERIN (NITROSTAT) 0.4 MG SL tablet Place 1 tablet (0.4 mg total) under the tongue every 5 (five) minutes as needed for chest pain. 30 tablet 2   ondansetron (ZOFRAN) 8 MG tablet Take 1 tablet (8 mg total) by mouth every 8 (eight) hours as needed for nausea. 30 tablet 3   polyethylene glycol (MIRALAX) 17 g packet Take 17 g by mouth daily. 30 each 1   prochlorperazine (COMPAZINE) 10 MG tablet Take 1 tablet (10 mg total) by mouth every 6 (six) hours as needed for nausea or vomiting. 30 tablet 0   senna-docusate (SENOKOT-S) 8.6-50 MG tablet Take 2 tablets by mouth 2 (two) times daily. 90 tablet 1   No current facility-administered medications for this visit.   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    12/14/2020 Imaging   CT chest abdomen and pelvis IMPRESSION:  1.  2 small nodules in the right lung, nonspecific.  2.  Prominent precarinal lymph node versus  adjacent small nodes.  3.  Enlarged heterogeneous uterine fundus.  4.  No definite adenopathy in the abdomen or pelvis. Nonspecific small lymph nodes are present, not enlarged by CT criteria.  5.  Mild wall thickening of the anterior aspect of the urinary bladder which may be due to to nondistention or inflammation.    12/27/2020 Pathology Results   SPECIMEN     Procedure:    Total hysterectomy and bilateral salpingo-oophorectomy   TUMOR     Histologic Type:    Serous carcinoma     Histologic Grade:    Not applicable     Myometrial Invasion:    Present       Depth of Myometrial Invasion:    11 mm       Myometrial Thickness:    12 mm       Percentage of Myometrial Invasion:    92 %     Uterine Serosa Involvement:    Not identified     Cervical Stromal Involvement:    Not identified     Other Tissue / Organ Involvement:  Not identified     Peritoneal / Ascitic Fluid:    Not identified     Lymphovascular Invasion (LVI):    Present   MARGINS     Margin Status:    Not applicable   REGIONAL LYMPH NODES     Regional Lymph Node Status:           :    All regional lymph nodes negative for tumor cells         Lymph Nodes Examined:               Total Number of Pelvic Nodes Examined:    3           Number of Pelvic Sentinel Nodes Examined:    3           Total Number of Para-aortic Nodes Examined:    0           Number of Para-aortic Sentinel Nodes Examined:    Not applicable   DISTANT METASTASIS     Distant Site(s) Involved:    Not applicable   PATHOLOGIC STAGE CLASSIFICATION (pTNM, AJCC 8th Edition)     Reporting of pT, pN, and (when applicable) pM categories is based on information available to the pathologist at the time the report is issued. As per the AJCC (Chapter 1, 8th Ed.) it is the managing physician's responsibility to establish the final pathologic stage based upon all pertinent information, including but potentially not limited to this pathology report.     TNM Descriptors:     Not applicable     Tumor Modifier:    Not applicable     pT Category:    pT1b     Regional Lymph Nodes Modifier:    (sn)     pN Category:    pN0   The endometrial adenocarcinoma was analyzed by IHC for DNA mismatch repair proteins.  The neoplasm retained nuclear expression of all four mismatch repair proteins, MLH1, PMS2, MSH2, MSH6.    Estrogen receptors (ER): Negative Progesterone receptors (PR): Negative  Controls worked appropriately.   A HER2 stain is POSITIVE (3+).   12/27/2020 Surgery   At [facility], she underwent a robotic hysterectomy/BSO, sentinel node dissection and partial omentectomy. She was found to have an enlarged uterus. An enlarged right pelvic sentinel node.    02/23/2021 Echocardiogram   Left Ventricle: Systolic function is normal. EF: 55-60%.    Left Ventricle: Doppler parameters consistent with mild diastolic  dysfunction and low to normal LA pressure.    Left Ventricle: There is mild concentric hypertrophy.    Aortic Valve: Mild aortic valve regurgitation.   01/27/2022 Imaging   CT chest imaging 1. No pulmonary embolus. 2. Findings consistent with intrathoracic malignancy. Multiple pulmonary nodules throughout both lungs of varying sizes, consistent with metastatic disease. Primary site of malignancy may represent a 4 cm spiculated nodule in the right perihilar region spanning the fissure that is cavitating. Alternatively all of these nodules may be metastatic given history of cancer, type not specified.  3. Multifocal mediastinal and bilateral hilar adenopathy, consistent metastatic disease. 4. Nondisplaced left anterior seventh and eighth rib fractures, soft tissue thickening adjacent to the seventh rib fracture suggests this may be pathologic 5. Right hydronephrosis is partially included in the field of view in the upper abdomen. Small soft tissue density medial to the right hepatic lobe posterior to the right kidney is nonspecific but may represent metastatic  disease. Recommend staging CT of the abdomen and  pelvis with oral and IV contrast. 6. Moderate emphysema. 7. Aortic atherosclerosis.  Coronary artery calcifications.   01/28/2022 Initial Diagnosis   Endometrial cancer ([name])   01/28/2022 Cancer Staging   Staging form: Corpus Uteri - Carcinoma and Carcinosarcoma, AJCC 8th Edition - Clinical stage from 01/28/2022: FIGO Stage IVB (rcT1b, cN2a, cM1) - Signed by Artis Delay, MD on 01/28/2022 Stage prefix: Recurrence   01/28/2022 Imaging   CT abdomen and pelvis 1. Multiple abdominal and pelvic metastatic implants as above. 2. Moderate right hydronephrosis secondary to mass effect and obstruction of the distal right ureter by a right pelvic metastatic implant. 3. Multiple pulmonary metastatic disease in the visualized right lung base. 4. No bowel obstruction. Normal appendix. 5. Aortic Atherosclerosis (ICD10-I70.0).   01/30/2022 Echocardiogram    1. Left ventricular ejection fraction, by estimation, is 55 to 60%. The left ventricle has normal function. The left ventricle has no regional wall motion abnormalities. There is moderate concentric left ventricular  hypertrophy. Left ventricular diastolic parameters are consistent with Grade I diastolic dysfunction (impaired relaxation).   2. Right ventricular systolic function is normal. The right ventricular size is normal. There is normal pulmonary artery systolic pressure.   3. Left atrial size was mildly dilated.   4. The mitral valve is normal in structure. Trivial mitral valve regurgitation. No evidence of mitral stenosis.   5. The aortic valve is tricuspid. Aortic valve regurgitation is trivial. No aortic stenosis is present.   6. Aortic dilatation noted. There is mild dilatation of the aortic root, measuring 39 mm.   7. The inferior vena cava is normal in size with <50% respiratory variability, suggesting right atrial pressure of 8 mmHg.    01/31/2022 - 04/04/2022 Chemotherapy   Patient is on  Treatment Plan : UTERINE SEROUS CARCINOMA Carboplatin + Paclitaxel + Trastuzumab q21d x 6 Cycles / Trastuzumab q21d     01/31/2022 - 05/18/2022 Chemotherapy   Patient is on Treatment Plan : UTERINE SEROUS CARCINOMA Carboplatin + Paclitaxel + Trastuzumab q21d x 6 Cycles / Trastuzumab q21d     04/03/2022 Imaging   1. Pulmonary nodules are either reduced in size or completely resolved. No new nodules. 2. Marked reduction in size peritoneal nodular metastasis. Multiple nodular lesions are no longer measurable. 3. Interval reduction in size of implant in the RIGHT operator space. While this implant is smaller, lesion continues to obstruct the RIGHT ureter with persistent RIGHT hydronephrosis and hydroureter. 4. No new metastatic lesions are present.   05/09/2022 Echocardiogram    1. Left ventricular ejection fraction, by estimation, is 45 to 50%. The left ventricle has mildly decreased function. The left ventricle demonstrates global hypokinesis. There is moderate concentric left ventricular hypertrophy. Left ventricular diastolic parameters are consistent with Grade I diastolic dysfunction (impaired relaxation). The average left ventricular global longitudinal strain is -10.8 %. The global longitudinal strain is abnormal.  2. Right ventricular systolic function is normal. The right ventricular size is normal.  3. The mitral valve is normal in structure. No evidence of mitral valve regurgitation.  4. The aortic valve is tricuspid. Aortic valve regurgitation is mild.  5. There is mild dilatation of the ascending aorta, measuring 39 mm.  6. The inferior vena cava is normal in size with <50% respiratory variability, suggesting right atrial pressure of 8 mmHg.   07/24/2022 Imaging   1. Improving appearance of sequela of metastatic disease. RIGHT pelvic sidewall mass obstructing the RIGHT ureter is no longer clearly seen. LEFT retroperitoneal soft tissue has diminished. 2.  No signs of pulmonary metastatic  disease or metastatic disease to the chest. 3. Mild RIGHT hydroureter without hydronephrosis. Resolution of hydronephrosis but with further atrophy of the RIGHT kidney. 4. Aortic atherosclerosis and 3 vessel coronary artery disease.   Aortic Atherosclerosis (ICD10-I70.0).   10/25/2022 Imaging   1. New pathologically enlarged mediastinal and left hilar lymph nodes as well as redemonstration previously treated/resolved pulmonary nodules, consistent with thoracic metastatic disease. Consider further evaluation by nuclear medicine PET-CT. 2. New subtle 14 mm hypodense lesion in the hepatic dome with increased conspicuity of a previously treated hepatic metastatic lesion in the right lobe of the liver, suspicious for new/recurrent hepatic metastatic disease. 3. Interval increase in the soft tissue thickening adjacent to loops of bowel in the right lower quadrant near the right external iliac artery, suspicious for recurrent disease. 4. Slight interval decrease in size of the left retroperitoneal soft tissue nodule. 5. Small focus of soft tissue nodularity along the left vaginal cuff is similar prior. 6.  Aortic Atherosclerosis (ICD10-I70.0).   11/06/2022 -  Chemotherapy   Patient is on Treatment Plan : UTERINE ENDOMETRIAL Dostarlimab-gxly (500 mg) + Carboplatin (AUC 5) + Paclitaxel (175 mg/m2) q21d x 6 cycles / Dostarlimab-gxly (1000 mg) q42d x 6 cycles      01/23/2023 Imaging   CT CHEST ABDOMEN PELVIS W CONTRAST  Result Date: 01/22/2023 CLINICAL DATA:  Follow-up metastatic endometrial carcinoma. Undergoing chemotherapy. * Tracking Code: BO * EXAM: CT CHEST, ABDOMEN, AND PELVIS WITH CONTRAST TECHNIQUE: Multidetector CT imaging of the chest, abdomen and pelvis was performed following the standard protocol during bolus administration of intravenous contrast. RADIATION DOSE REDUCTION: This exam was performed according to the departmental dose-optimization program which includes automated exposure control,  adjustment of the mA and/or kV according to patient size and/or use of iterative reconstruction technique. CONTRAST:  80mL OMNIPAQUE IOHEXOL 300 MG/ML  SOLN COMPARISON:  10/24/2022 FINDINGS: CT CHEST FINDINGS Cardiovascular: No acute findings. Mediastinum/Lymph Nodes: Mediastinal and left hilar lymphadenopathy is decreased since previous study. Precarinal lymph node currently measures 11 mm on image 26/2 compared to 15 mm previously. Previously seen left hilar lymphadenopathy is no longer visualized. No new or increased sites of lymphadenopathy identified. Lungs/Pleura: Previously seen pulmonary nodule in the central left upper lobe has resolved since prior study. Other nodule in the superior segment of the right lower lobe has also resolved. No suspicious pulmonary nodules or masses identified. No evidence of infiltrate or pleural effusion. Musculoskeletal:  No suspicious bone lesions identified. CT ABDOMEN AND PELVIS FINDINGS Hepatobiliary: No suspicious liver lesions seen on today's exam. Previously suspected small lesion in the liver dome is no longer visualized. Gallbladder is unremarkable. No evidence of biliary ductal dilatation. Pancreas:  No mass or inflammatory changes. Spleen:  Within normal limits in size and appearance. Adrenals/Urinary tract: No suspicious masses or hydronephrosis. Chronic right renal atrophy remains stable. Stomach/Bowel: No evidence of obstruction, inflammatory process, or abnormal fluid collections. Vascular/Lymphatic: No pathologically enlarged lymph nodes identified. No acute vascular findings. Aortic atherosclerotic calcification incidentally noted. Reproductive: Prior hysterectomy noted. Vaginal cuff is unremarkable in appearance. No pelvic mass identified. Adnexal regions are unremarkable in appearance. Other:  None. Musculoskeletal:  No suspicious bone lesions identified. IMPRESSION: Resolution of pulmonary metastases since prior exam. Interval decrease in mediastinal and left  hilar lymphadenopathy. Previously suspected small liver lesion and other subtle areas of soft tissue nodularity in the abdomen and pelvis are no longer visualized. No new or progressive disease. 1 Aortic Atherosclerosis (ICD10-I70.0). Electronically Signed  By: Danae Orleans M.D.   On: 01/22/2023 15:27      07/03/2023 Imaging   CT CHEST ABDOMEN PELVIS W CONTRAST  Result Date: 07/03/2023 CLINICAL DATA:  History of metastatic endometrial carcinoma, follow-up. * Tracking Code: BO * EXAM: CT CHEST, ABDOMEN, AND PELVIS WITH CONTRAST TECHNIQUE: Multidetector CT imaging of the chest, abdomen and pelvis was performed following the standard protocol during bolus administration of intravenous contrast. RADIATION DOSE REDUCTION: This exam was performed according to the departmental dose-optimization program which includes automated exposure control, adjustment of the mA and/or kV according to patient size and/or use of iterative reconstruction technique. CONTRAST:  80mL OMNIPAQUE IOHEXOL 300 MG/ML  SOLN COMPARISON:  Multiple priors including most recent CT January 18, 2023 FINDINGS: CT CHEST FINDINGS Cardiovascular: Accessed right chest Port-A-Cath with tip near the superior cavoatrial junction. Aortic atherosclerosis. No central pulmonary embolus on this nondedicated study. Coronary artery calcifications. Mediastinum/Nodes: No suspicious thyroid nodule. Similar mediastinal adenopathy. No new or increased thoracic adenopathy identified. Precarinal lymph node measures 11 mm in short axis on image 26/2, unchanged. Lungs/Pleura: Stable scattered tiny pulmonary nodules. No new suspicious pulmonary nodules or masses. For reference: -Pulmonary nodules in the bilateral lung apices measure 2 mm on image 21/6 -right upper lobe pulmonary nodule measures 2 mm on image 27/6. Mild paraseptal emphysema.  No pleural effusion.  No pneumothorax. Musculoskeletal: No aggressive lytic or blastic lesion of bone. Remote left rib fracture. Mild  thoracic spondylosis. CT ABDOMEN PELVIS FINDINGS Hepatobiliary: No suspicious hepatic lesion identified. Gallbladder is unremarkable. No biliary ductal dilation. Pancreas: No pancreatic ductal dilation or evidence of acute inflammation. Stable 4 mm cyst in the pancreatic head on image 68/2 favor a side branch IPMN. Spleen: No splenomegaly or focal splenic lesion. Adrenals/Urinary Tract: Bilateral adrenal glands appear normal. No hydronephrosis. Similar chronic right renal atrophy. Urinary bladder is unremarkable for degree of distension. Stomach/Bowel: Radiopaque enteric contrast material traverses distal loops of small bowel. Stomach is unremarkable for degree of distension. No pathologic dilation of small or large bowel. No evidence of acute bowel inflammation. Vascular/Lymphatic: Aortic atherosclerosis. Similar ectasia of the infrarenal abdominal aorta measuring 2.5 cm, requiring no independent imaging follow-up. Smooth IVC contours. No pathologically enlarged abdominal or pelvic lymph nodes. Reproductive: Uterus is surgically absent without new enhancing nodularity along the vaginal cuff. No suspicious adnexal mass. Other: No significant abdominopelvic free fluid. Small fat containing paraumbilical hernia. Musculoskeletal: No aggressive lytic or blastic lesion of bone. Multilevel degenerative changes spine. Degenerative grade 1 L5 on S1 anterolisthesis. Degenerative change of the hips. IMPRESSION: 1. Stable examination without evidence of new or progressive disease in the chest, abdomen or pelvis. 2. Stable thoracic lymph nodes and scattered tiny pulmonary nodules. 3. 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 Recommend attention on follow-up imaging. 4. Aortic Atherosclerosis (ICD10-I70.0) and Emphysema (ICD10-J43.9). Electronically Signed   By: Maudry Mayhew M.D.   On: 07/03/2023 15:43      Metastasis to lung ([name])  01/28/2022 Initial Diagnosis   Metastasis to lung ([name])    11/06/2022 -  Chemotherapy   Patient is on Treatment Plan : UTERINE ENDOMETRIAL Dostarlimab-gxly (500 mg) + Carboplatin (AUC 5) + Paclitaxel (175 mg/m2) q21d x 6 cycles / Dostarlimab-gxly (1000 mg) q42d x 6 cycles        PHYSICAL EXAMINATION: ECOG PERFORMANCE STATUS: 0 - Asymptomatic  Vitals:   07/10/23 1138  BP: 125/80  Pulse: 68  Resp: 18  Temp: 98 F (36.7 C)  SpO2:  96%   Filed Weights   07/10/23 1138  Weight: 147 lb 6.4 oz (66.9 kg)    GENERAL:alert, no distress and comfortable  LABORATORY DATA:  I have reviewed the data as listed    Component Value Date/Time   NA 137 07/10/2023 1123   NA 141 02/25/2019 1222   K 3.8 07/10/2023 1123   CL 105 07/10/2023 1123   CO2 28 07/10/2023 1123   GLUCOSE 143 (H) 07/10/2023 1123   BUN 33 (H) 07/10/2023 1123   BUN 17 02/25/2019 1222   CREATININE 1.64 (H) 07/10/2023 1123   CALCIUM 8.8 (L) 07/10/2023 1123   PROT 7.1 07/10/2023 1123   PROT 6.9 09/22/2019 1419   ALBUMIN 3.6 07/10/2023 1123   ALBUMIN 3.9 09/22/2019 1419   AST 14 (L) 07/10/2023 1123   ALT 10 07/10/2023 1123   ALKPHOS 41 07/10/2023 1123   BILITOT 0.5 07/10/2023 1123   GFRNONAA 36 (L) 07/10/2023 1123   GFRAA >60 05/16/2019 0335    No results found for: "SPEP", "UPEP"  Lab Results  Component Value Date   WBC 4.7 07/10/2023   NEUTROABS 1.7 07/10/2023   HGB 11.0 (L) 07/10/2023   HCT 33.9 (L) 07/10/2023   MCV 89.4 07/10/2023   PLT 169 07/10/2023      Chemistry      Component Value Date/Time   NA 137 07/10/2023 1123   NA 141 02/25/2019 1222   K 3.8 07/10/2023 1123   CL 105 07/10/2023 1123   CO2 28 07/10/2023 1123   BUN 33 (H) 07/10/2023 1123   BUN 17 02/25/2019 1222   CREATININE 1.64 (H) 07/10/2023 1123      Component Value Date/Time   CALCIUM 8.8 (L) 07/10/2023 1123   ALKPHOS 41 07/10/2023 1123   AST 14 (L) 07/10/2023 1123   ALT 10 07/10/2023 1123   BILITOT 0.5 07/10/2023 1123       RADIOGRAPHIC STUDIES: I have reviewed imaging study with  the patient I have personally reviewed the radiological images as listed and agreed with the findings in the report. CT CHEST ABDOMEN PELVIS W CONTRAST  Result Date: 07/03/2023 CLINICAL DATA:  History of metastatic endometrial carcinoma, follow-up. * Tracking Code: BO * EXAM: CT CHEST, ABDOMEN, AND PELVIS WITH CONTRAST TECHNIQUE: Multidetector CT imaging of the chest, abdomen and pelvis was performed following the standard protocol during bolus administration of intravenous contrast. RADIATION DOSE REDUCTION: This exam was performed according to the departmental dose-optimization program which includes automated exposure control, adjustment of the mA and/or kV according to patient size and/or use of iterative reconstruction technique. CONTRAST:  80mL OMNIPAQUE IOHEXOL 300 MG/ML  SOLN COMPARISON:  Multiple priors including most recent CT January 18, 2023 FINDINGS: CT CHEST FINDINGS Cardiovascular: Accessed right chest Port-A-Cath with tip near the superior cavoatrial junction. Aortic atherosclerosis. No central pulmonary embolus on this nondedicated study. Coronary artery calcifications. Mediastinum/Nodes: No suspicious thyroid nodule. Similar mediastinal adenopathy. No new or increased thoracic adenopathy identified. Precarinal lymph node measures 11 mm in short axis on image 26/2, unchanged. Lungs/Pleura: Stable scattered tiny pulmonary nodules. No new suspicious pulmonary nodules or masses. For reference: -Pulmonary nodules in the bilateral lung apices measure 2 mm on image 21/6 -right upper lobe pulmonary nodule measures 2 mm on image 27/6. Mild paraseptal emphysema.  No pleural effusion.  No pneumothorax. Musculoskeletal: No aggressive lytic or blastic lesion of bone. Remote left rib fracture. Mild thoracic spondylosis. CT ABDOMEN PELVIS FINDINGS Hepatobiliary: No suspicious hepatic lesion identified. Gallbladder is unremarkable. No biliary ductal  dilation. Pancreas: No pancreatic ductal dilation or evidence of  acute inflammation. Stable 4 mm cyst in the pancreatic head on image 68/2 favor a side branch IPMN. Spleen: No splenomegaly or focal splenic lesion. Adrenals/Urinary Tract: Bilateral adrenal glands appear normal. No hydronephrosis. Similar chronic right renal atrophy. Urinary bladder is unremarkable for degree of distension. Stomach/Bowel: Radiopaque enteric contrast material traverses distal loops of small bowel. Stomach is unremarkable for degree of distension. No pathologic dilation of small or large bowel. No evidence of acute bowel inflammation. Vascular/Lymphatic: Aortic atherosclerosis. Similar ectasia of the infrarenal abdominal aorta measuring 2.5 cm, requiring no independent imaging follow-up. Smooth IVC contours. No pathologically enlarged abdominal or pelvic lymph nodes. Reproductive: Uterus is surgically absent without new enhancing nodularity along the vaginal cuff. No suspicious adnexal mass. Other: No significant abdominopelvic free fluid. Small fat containing paraumbilical hernia. Musculoskeletal: No aggressive lytic or blastic lesion of bone. Multilevel degenerative changes spine. Degenerative grade 1 L5 on S1 anterolisthesis. Degenerative change of the hips. IMPRESSION: 1. Stable examination without evidence of new or progressive disease in the chest, abdomen or pelvis. 2. Stable thoracic lymph nodes and scattered tiny pulmonary nodules. 3. 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 Recommend attention on follow-up imaging. 4. Aortic Atherosclerosis (ICD10-I70.0) and Emphysema (ICD10-J43.9). Electronically Signed   By: Maudry Mayhew M.D.   On: 07/03/2023 15:43

## 2023-07-10 NOTE — Assessment & Plan Note (Signed)
I have reviewed multiple CT imaging with the patient and her mother She has complete response to treatment with no signs of cancer recurrence We will continue maintenance immunotherapy I plan to repeat imaging study again in 4 months, due around March 2025

## 2023-07-11 LAB — T4: T4, Total: 5.8 ug/dL (ref 4.5–12.0)

## 2023-08-21 ENCOUNTER — Inpatient Hospital Stay: Payer: Medicare HMO

## 2023-08-21 ENCOUNTER — Inpatient Hospital Stay: Payer: Medicare HMO | Admitting: Hematology and Oncology

## 2023-08-30 ENCOUNTER — Inpatient Hospital Stay: Payer: Medicare HMO | Attending: Hematology and Oncology

## 2023-08-30 ENCOUNTER — Encounter: Payer: Self-pay | Admitting: Hematology and Oncology

## 2023-08-30 ENCOUNTER — Inpatient Hospital Stay: Payer: Medicare HMO

## 2023-08-30 ENCOUNTER — Inpatient Hospital Stay (HOSPITAL_BASED_OUTPATIENT_CLINIC_OR_DEPARTMENT_OTHER): Payer: Medicare HMO | Admitting: Hematology and Oncology

## 2023-08-30 VITALS — BP 149/82 | HR 67 | Temp 98.0°F | Resp 18 | Ht 63.0 in | Wt 148.4 lb

## 2023-08-30 DIAGNOSIS — Z79899 Other long term (current) drug therapy: Secondary | ICD-10-CM | POA: Insufficient documentation

## 2023-08-30 DIAGNOSIS — C7800 Secondary malignant neoplasm of unspecified lung: Secondary | ICD-10-CM | POA: Diagnosis present

## 2023-08-30 DIAGNOSIS — N183 Chronic kidney disease, stage 3 unspecified: Secondary | ICD-10-CM

## 2023-08-30 DIAGNOSIS — Z5112 Encounter for antineoplastic immunotherapy: Secondary | ICD-10-CM | POA: Diagnosis present

## 2023-08-30 DIAGNOSIS — C541 Malignant neoplasm of endometrium: Secondary | ICD-10-CM

## 2023-08-30 DIAGNOSIS — Z87891 Personal history of nicotine dependence: Secondary | ICD-10-CM | POA: Diagnosis not present

## 2023-08-30 DIAGNOSIS — I10 Essential (primary) hypertension: Secondary | ICD-10-CM | POA: Diagnosis not present

## 2023-08-30 DIAGNOSIS — C7801 Secondary malignant neoplasm of right lung: Secondary | ICD-10-CM

## 2023-08-30 DIAGNOSIS — Z79620 Long term (current) use of immunosuppressive biologic: Secondary | ICD-10-CM | POA: Diagnosis not present

## 2023-08-30 LAB — COMPREHENSIVE METABOLIC PANEL
ALT: 8 U/L (ref 0–44)
AST: 13 U/L — ABNORMAL LOW (ref 15–41)
Albumin: 3.7 g/dL (ref 3.5–5.0)
Alkaline Phosphatase: 44 U/L (ref 38–126)
Anion gap: 6 (ref 5–15)
BUN: 38 mg/dL — ABNORMAL HIGH (ref 6–20)
CO2: 28 mmol/L (ref 22–32)
Calcium: 8.9 mg/dL (ref 8.9–10.3)
Chloride: 104 mmol/L (ref 98–111)
Creatinine, Ser: 1.46 mg/dL — ABNORMAL HIGH (ref 0.44–1.00)
GFR, Estimated: 41 mL/min — ABNORMAL LOW (ref >60)
Glucose, Bld: 157 mg/dL — ABNORMAL HIGH (ref 70–99)
Potassium: 3.5 mmol/L (ref 3.5–5.1)
Sodium: 138 mmol/L (ref 135–145)
Total Bilirubin: 0.5 mg/dL (ref 0.0–1.2)
Total Protein: 6.9 g/dL (ref 6.5–8.1)

## 2023-08-30 LAB — CBC WITH DIFFERENTIAL/PLATELET
Abs Immature Granulocytes: 0.01 10*3/uL (ref 0.00–0.07)
Basophils Absolute: 0 10*3/uL (ref 0.0–0.1)
Basophils Relative: 0 %
Eosinophils Absolute: 0.1 10*3/uL (ref 0.0–0.5)
Eosinophils Relative: 3 %
HCT: 36 % (ref 36.0–46.0)
Hemoglobin: 11.9 g/dL — ABNORMAL LOW (ref 12.0–15.0)
Immature Granulocytes: 0 %
Lymphocytes Relative: 44 %
Lymphs Abs: 2.1 10*3/uL (ref 0.7–4.0)
MCH: 28.2 pg (ref 26.0–34.0)
MCHC: 33.1 g/dL (ref 30.0–36.0)
MCV: 85.3 fL (ref 80.0–100.0)
Monocytes Absolute: 0.5 10*3/uL (ref 0.1–1.0)
Monocytes Relative: 9 %
Neutro Abs: 2.1 10*3/uL (ref 1.7–7.7)
Neutrophils Relative %: 44 %
Platelets: 179 10*3/uL (ref 150–400)
RBC: 4.22 MIL/uL (ref 3.87–5.11)
RDW: 15.2 % (ref 11.5–15.5)
WBC: 4.8 10*3/uL (ref 4.0–10.5)
nRBC: 0 % (ref 0.0–0.2)

## 2023-08-30 LAB — TSH: TSH: 1.138 u[IU]/mL (ref 0.350–4.500)

## 2023-08-30 MED ORDER — SODIUM CHLORIDE 0.9% FLUSH
10.0000 mL | Freq: Once | INTRAVENOUS | Status: AC
  Administered 2023-08-30: 10 mL

## 2023-08-30 MED ORDER — SODIUM CHLORIDE 0.9 % IV SOLN: Freq: Once | INTRAVENOUS | Status: AC

## 2023-08-30 MED ORDER — HYDROCHLOROTHIAZIDE 25 MG PO TABS: 25.0000 mg | ORAL_TABLET | Freq: Every day | ORAL | 3 refills | Status: DC

## 2023-08-30 MED ORDER — SODIUM CHLORIDE 0.9 % IV SOLN
1000.0000 mg | Freq: Once | INTRAVENOUS | Status: AC
  Administered 2023-08-30: 1000 mg via INTRAVENOUS
  Filled 2023-08-30: qty 20

## 2023-08-30 MED ORDER — HEPARIN SOD (PORK) LOCK FLUSH 100 UNIT/ML IV SOLN
500.0000 [IU] | Freq: Once | INTRAVENOUS | Status: AC | PRN
  Administered 2023-08-30: 500 [IU]

## 2023-08-30 MED ORDER — SODIUM CHLORIDE 0.9% FLUSH
10.0000 mL | INTRAVENOUS | Status: DC | PRN
  Administered 2023-08-30: 10 mL

## 2023-08-30 NOTE — Assessment & Plan Note (Signed)
 She has intermittent acute on chronic renal failure We discussed importance of hydration and risk factor modification She can proceed as long as creatinine is less than 2

## 2023-08-30 NOTE — Progress Notes (Signed)
[facility] OFFICE PROGRESS NOTE  Patient Care Team: Verlon Au, MD as PCP - General (Family Medicine) Runell Gess, MD as PCP - Cardiology (Cardiology) Artis Delay, MD as Consulting Physician (Hematology and Oncology)  ASSESSMENT & PLAN:  Endometrial cancer [facility]) Her last imaging in November 2024 showed complete response to treatment with no signs of cancer recurrence We will continue maintenance immunotherapy I plan to repeat imaging study again in March 2025  HTN (hypertension) Her blood pressure is better controlled She will continue current antihypertensives  CKD (chronic kidney disease), stage III ([name]) She has intermittent acute on chronic renal failure We discussed importance of hydration and risk factor modification She can proceed as long as creatinine is less than 2  No orders of the defined types were placed in this encounter.   All questions were answered. The patient knows to call the clinic with any problems, questions or concerns. The total time spent in the appointment was 20 minutes encounter with patients including review of chart and various tests results, discussions about plan of care and coordination of care plan   Artis Delay, MD 08/30/2023 2:37 PM  INTERVAL HISTORY: Please see below for problem oriented charting. she returns for chemo follow-up She tolerated last cycle therapy well She denies side effects Her blood pressure monitoring from home were within normal range She has quit smoking  REVIEW OF SYSTEMS:   Constitutional: Denies fevers, chills or abnormal weight loss Eyes: Denies blurriness of vision Ears, nose, mouth, throat, and face: Denies mucositis or sore throat Respiratory: Denies cough, dyspnea or wheezes Cardiovascular: Denies palpitation, chest discomfort or lower extremity swelling Gastrointestinal:  Denies nausea, heartburn or change in bowel habits Skin: Denies abnormal skin rashes Lymphatics: Denies  new lymphadenopathy or easy bruising Neurological:Denies numbness, tingling or new weaknesses Behavioral/Psych: Mood is stable, no new changes  All other systems were reviewed with the patient and are negative.  I have reviewed the past medical history, past surgical history, social history and family history with the patient and they are unchanged from previous note.  ALLERGIES:  is allergic to statins, agrostis alba pollen extract [gramineae pollens], and zetia [ezetimibe].  MEDICATIONS:  Current Outpatient Medications  Medication Sig Dispense Refill   acetaminophen (TYLENOL) 500 MG tablet Take 1,000 mg by mouth every 6 (six) hours as needed for mild pain or headache.     aspirin EC 81 MG tablet Take 1 tablet (81 mg total) by mouth daily. Swallow whole.     doxazosin (CARDURA) 4 MG tablet Take 1 tablet (4 mg total) by mouth daily. 30 tablet 3   Evolocumab (REPATHA SURECLICK) 140 MG/ML SOAJ Inject 140 mg into the skin every 14 (fourteen) days. 2 pen 11   hydrochlorothiazide (HYDRODIURIL) 25 MG tablet Take 1 tablet (25 mg total) by mouth daily. 30 tablet 3   lidocaine-prilocaine (EMLA) cream Apply 1 application topically as needed. 30 g 0   loratadine (CLARITIN) 10 MG tablet Take 1 tablet (10 mg total) by mouth daily as needed for allergies. 30 tablet 11   losartan (COZAAR) 100 MG tablet Take 1 tablet (100 mg total) by mouth daily. 90 tablet 0   metFORMIN (GLUCOPHAGE) 1000 MG tablet Take 1,000 mg by mouth 2 (two) times daily with a meal.     metoprolol succinate (TOPROL XL) 100 MG 24 hr tablet Take 1 tablet (100 mg total) by mouth daily. 30 tablet 3   nitroGLYCERIN (NITROSTAT) 0.4 MG SL tablet Place 1 tablet (0.4  mg total) under the tongue every 5 (five) minutes as needed for chest pain. 30 tablet 2   ondansetron (ZOFRAN) 8 MG tablet Take 1 tablet (8 mg total) by mouth every 8 (eight) hours as needed for nausea. 30 tablet 3   polyethylene glycol (MIRALAX) 17 g packet Take 17 g by mouth daily.  30 each 1   prochlorperazine (COMPAZINE) 10 MG tablet Take 1 tablet (10 mg total) by mouth every 6 (six) hours as needed for nausea or vomiting. 30 tablet 0   senna-docusate (SENOKOT-S) 8.6-50 MG tablet Take 2 tablets by mouth 2 (two) times daily. 90 tablet 1   No current facility-administered medications for this visit.   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    12/14/2020 Imaging   CT chest abdomen and pelvis IMPRESSION:  1.  2 small nodules in the right lung, nonspecific.  2.  Prominent precarinal lymph node versus adjacent small nodes.  3.  Enlarged heterogeneous uterine fundus.  4.  No definite adenopathy in the abdomen or pelvis. Nonspecific small lymph nodes are present, not enlarged by CT criteria.  5.  Mild wall thickening of the anterior aspect of the urinary bladder which may be due to to nondistention or inflammation.    12/27/2020 Pathology Results   SPECIMEN     Procedure:    Total hysterectomy and bilateral salpingo-oophorectomy   TUMOR     Histologic Type:    Serous carcinoma     Histologic Grade:    Not applicable     Myometrial Invasion:    Present       Depth of Myometrial Invasion:    11 mm       Myometrial Thickness:    12 mm       Percentage of Myometrial Invasion:    92 %      Uterine Serosa Involvement:    Not identified     Cervical Stromal Involvement:    Not identified     Other Tissue / Organ Involvement:    Not identified     Peritoneal / Ascitic Fluid:    Not identified     Lymphovascular Invasion (LVI):    Present   MARGINS     Margin Status:    Not applicable   REGIONAL LYMPH NODES     Regional Lymph Node Status:           :    All regional lymph nodes negative for tumor cells         Lymph Nodes Examined:               Total Number of Pelvic Nodes Examined:    3  Number of Pelvic Sentinel Nodes Examined:    3           Total Number of Para-aortic Nodes Examined:    0           Number of Para-aortic Sentinel Nodes Examined:    Not applicable   DISTANT METASTASIS     Distant Site(s) Involved:    Not applicable   PATHOLOGIC STAGE CLASSIFICATION (pTNM, AJCC 8th Edition)     Reporting of pT, pN, and (when applicable) pM categories is based on information available to the pathologist at the time the report is issued. As per the AJCC (Chapter 1, 8th Ed.) it is the managing physician's responsibility to establish the final pathologic stage based upon all pertinent information, including but potentially not limited to this pathology report.     TNM Descriptors:    Not applicable     Tumor Modifier:    Not applicable     pT Category:    pT1b     Regional Lymph Nodes Modifier:    (sn)     pN Category:    pN0   The endometrial adenocarcinoma was analyzed by IHC for DNA mismatch repair proteins.  The neoplasm retained nuclear expression of all four mismatch repair proteins, MLH1, PMS2, MSH2, MSH6.    Estrogen receptors (ER): Negative Progesterone receptors (PR): Negative  Controls worked appropriately.   A HER2 stain is POSITIVE (3+).   12/27/2020 Surgery   At [facility], she underwent a robotic hysterectomy/BSO, sentinel node dissection and partial omentectomy. She was found to have an enlarged uterus. An enlarged right pelvic sentinel node.     02/23/2021 Echocardiogram   Left Ventricle: Systolic function is normal. EF: 55-60%.    Left Ventricle: Doppler parameters consistent with mild diastolic  dysfunction and low to normal LA pressure.    Left Ventricle: There is mild concentric hypertrophy.    Aortic Valve: Mild aortic valve regurgitation.   01/27/2022 Imaging   CT chest imaging 1. No pulmonary embolus. 2. Findings consistent with intrathoracic malignancy. Multiple pulmonary nodules throughout both lungs of varying sizes, consistent with metastatic disease. Primary site of malignancy may represent a 4 cm spiculated nodule in the right perihilar region spanning the fissure that is cavitating. Alternatively all of these nodules may be metastatic given history of cancer, type not specified.  3. Multifocal mediastinal and bilateral hilar adenopathy, consistent metastatic disease. 4. Nondisplaced left anterior seventh and eighth rib fractures, soft tissue thickening adjacent to the seventh rib fracture suggests this may be pathologic 5. Right hydronephrosis is partially included in the field of view in the upper abdomen. Small soft tissue density medial to the right hepatic lobe posterior to the right kidney is nonspecific but may represent metastatic disease. Recommend staging CT of the abdomen and pelvis with oral and IV contrast. 6. Moderate emphysema. 7. Aortic atherosclerosis.  Coronary artery calcifications.   01/28/2022 Initial Diagnosis   Endometrial cancer ([name])   01/28/2022 Cancer Staging   Staging form: Corpus Uteri - Carcinoma and Carcinosarcoma, AJCC 8th Edition - Clinical stage from 01/28/2022: FIGO Stage IVB (rcT1b, cN2a, cM1) - Signed by Artis Delay, MD on 01/28/2022 Stage prefix: Recurrence   01/28/2022 Imaging   CT abdomen and pelvis 1. Multiple abdominal and pelvic metastatic implants as above. 2. Moderate right hydronephrosis secondary to mass effect and obstruction of the distal right ureter by a right pelvic  metastatic implant. 3. Multiple pulmonary metastatic disease in the visualized right lung base. 4. No bowel  obstruction. Normal appendix. 5. Aortic Atherosclerosis (ICD10-I70.0).   01/30/2022 Echocardiogram    1. Left ventricular ejection fraction, by estimation, is 55 to 60%. The left ventricle has normal function. The left ventricle has no regional wall motion abnormalities. There is moderate concentric left ventricular  hypertrophy. Left ventricular diastolic parameters are consistent with Grade I diastolic dysfunction (impaired relaxation).   2. Right ventricular systolic function is normal. The right ventricular size is normal. There is normal pulmonary artery systolic pressure.   3. Left atrial size was mildly dilated.   4. The mitral valve is normal in structure. Trivial mitral valve regurgitation. No evidence of mitral stenosis.   5. The aortic valve is tricuspid. Aortic valve regurgitation is trivial. No aortic stenosis is present.   6. Aortic dilatation noted. There is mild dilatation of the aortic root, measuring 39 mm.   7. The inferior vena cava is normal in size with <50% respiratory variability, suggesting right atrial pressure of 8 mmHg.    01/31/2022 - 04/04/2022 Chemotherapy   Patient is on Treatment Plan : UTERINE SEROUS CARCINOMA Carboplatin + Paclitaxel + Trastuzumab q21d x 6 Cycles / Trastuzumab q21d     01/31/2022 - 05/18/2022 Chemotherapy   Patient is on Treatment Plan : UTERINE SEROUS CARCINOMA Carboplatin + Paclitaxel + Trastuzumab q21d x 6 Cycles / Trastuzumab q21d     04/03/2022 Imaging   1. Pulmonary nodules are either reduced in size or completely resolved. No new nodules. 2. Marked reduction in size peritoneal nodular metastasis. Multiple nodular lesions are no longer measurable. 3. Interval reduction in size of implant in the RIGHT operator space. While this implant is smaller, lesion continues to obstruct the RIGHT ureter with persistent RIGHT hydronephrosis and  hydroureter. 4. No new metastatic lesions are present.   05/09/2022 Echocardiogram    1. Left ventricular ejection fraction, by estimation, is 45 to 50%. The left ventricle has mildly decreased function. The left ventricle demonstrates global hypokinesis. There is moderate concentric left ventricular hypertrophy. Left ventricular diastolic parameters are consistent with Grade I diastolic dysfunction (impaired relaxation). The average left ventricular global longitudinal strain is -10.8 %. The global longitudinal strain is abnormal.  2. Right ventricular systolic function is normal. The right ventricular size is normal.  3. The mitral valve is normal in structure. No evidence of mitral valve regurgitation.  4. The aortic valve is tricuspid. Aortic valve regurgitation is mild.  5. There is mild dilatation of the ascending aorta, measuring 39 mm.  6. The inferior vena cava is normal in size with <50% respiratory variability, suggesting right atrial pressure of 8 mmHg.   07/24/2022 Imaging   1. Improving appearance of sequela of metastatic disease. RIGHT pelvic sidewall mass obstructing the RIGHT ureter is no longer clearly seen. LEFT retroperitoneal soft tissue has diminished. 2. No signs of pulmonary metastatic disease or metastatic disease to the chest. 3. Mild RIGHT hydroureter without hydronephrosis. Resolution of hydronephrosis but with further atrophy of the RIGHT kidney. 4. Aortic atherosclerosis and 3 vessel coronary artery disease.   Aortic Atherosclerosis (ICD10-I70.0).   10/25/2022 Imaging   1. New pathologically enlarged mediastinal and left hilar lymph nodes as well as redemonstration previously treated/resolved pulmonary nodules, consistent with thoracic metastatic disease. Consider further evaluation by nuclear medicine PET-CT. 2. New subtle 14 mm hypodense lesion in the hepatic dome with increased conspicuity of a previously treated hepatic metastatic lesion in the right lobe of the  liver, suspicious for new/recurrent hepatic metastatic disease. 3. Interval increase in the soft  tissue thickening adjacent to loops of bowel in the right lower quadrant near the right external iliac artery, suspicious for recurrent disease. 4. Slight interval decrease in size of the left retroperitoneal soft tissue nodule. 5. Small focus of soft tissue nodularity along the left vaginal cuff is similar prior. 6.  Aortic Atherosclerosis (ICD10-I70.0).   11/06/2022 -  Chemotherapy   Patient is on Treatment Plan : UTERINE ENDOMETRIAL Dostarlimab-gxly (500 mg) + Carboplatin (AUC 5) + Paclitaxel (175 mg/m2) q21d x 6 cycles / Dostarlimab-gxly (1000 mg) q42d x 6 cycles      01/23/2023 Imaging   CT CHEST ABDOMEN PELVIS W CONTRAST  Result Date: 01/22/2023 CLINICAL DATA:  Follow-up metastatic endometrial carcinoma. Undergoing chemotherapy. * Tracking Code: BO * EXAM: CT CHEST, ABDOMEN, AND PELVIS WITH CONTRAST TECHNIQUE: Multidetector CT imaging of the chest, abdomen and pelvis was performed following the standard protocol during bolus administration of intravenous contrast. RADIATION DOSE REDUCTION: This exam was performed according to the departmental dose-optimization program which includes automated exposure control, adjustment of the mA and/or kV according to patient size and/or use of iterative reconstruction technique. CONTRAST:  80mL OMNIPAQUE IOHEXOL 300 MG/ML  SOLN COMPARISON:  10/24/2022 FINDINGS: CT CHEST FINDINGS Cardiovascular: No acute findings. Mediastinum/Lymph Nodes: Mediastinal and left hilar lymphadenopathy is decreased since previous study. Precarinal lymph node currently measures 11 mm on image 26/2 compared to 15 mm previously. Previously seen left hilar lymphadenopathy is no longer visualized. No new or increased sites of lymphadenopathy identified. Lungs/Pleura: Previously seen pulmonary nodule in the central left upper lobe has resolved since prior study. Other nodule in the superior  segment of the right lower lobe has also resolved. No suspicious pulmonary nodules or masses identified. No evidence of infiltrate or pleural effusion. Musculoskeletal:  No suspicious bone lesions identified. CT ABDOMEN AND PELVIS FINDINGS Hepatobiliary: No suspicious liver lesions seen on today's exam. Previously suspected small lesion in the liver dome is no longer visualized. Gallbladder is unremarkable. No evidence of biliary ductal dilatation. Pancreas:  No mass or inflammatory changes. Spleen:  Within normal limits in size and appearance. Adrenals/Urinary tract: No suspicious masses or hydronephrosis. Chronic right renal atrophy remains stable. Stomach/Bowel: No evidence of obstruction, inflammatory process, or abnormal fluid collections. Vascular/Lymphatic: No pathologically enlarged lymph nodes identified. No acute vascular findings. Aortic atherosclerotic calcification incidentally noted. Reproductive: Prior hysterectomy noted. Vaginal cuff is unremarkable in appearance. No pelvic mass identified. Adnexal regions are unremarkable in appearance. Other:  None. Musculoskeletal:  No suspicious bone lesions identified. IMPRESSION: Resolution of pulmonary metastases since prior exam. Interval decrease in mediastinal and left hilar lymphadenopathy. Previously suspected small liver lesion and other subtle areas of soft tissue nodularity in the abdomen and pelvis are no longer visualized. No new or progressive disease. 1 Aortic Atherosclerosis (ICD10-I70.0). Electronically Signed   By: Danae Orleans M.D.   On: 01/22/2023 15:27      07/03/2023 Imaging   CT CHEST ABDOMEN PELVIS W CONTRAST  Result Date: 07/03/2023 CLINICAL DATA:  History of metastatic endometrial carcinoma, follow-up. * Tracking Code: BO * EXAM: CT CHEST, ABDOMEN, AND PELVIS WITH CONTRAST TECHNIQUE: Multidetector CT imaging of the chest, abdomen and pelvis was performed following the standard protocol during bolus administration of intravenous  contrast. RADIATION DOSE REDUCTION: This exam was performed according to the departmental dose-optimization program which includes automated exposure control, adjustment of the mA and/or kV according to patient size and/or use of iterative reconstruction technique. CONTRAST:  80mL OMNIPAQUE IOHEXOL 300 MG/ML  SOLN COMPARISON:  Multiple priors  including most recent CT January 18, 2023 FINDINGS: CT CHEST FINDINGS Cardiovascular: Accessed right chest Port-A-Cath with tip near the superior cavoatrial junction. Aortic atherosclerosis. No central pulmonary embolus on this nondedicated study. Coronary artery calcifications. Mediastinum/Nodes: No suspicious thyroid nodule. Similar mediastinal adenopathy. No new or increased thoracic adenopathy identified. Precarinal lymph node measures 11 mm in short axis on image 26/2, unchanged. Lungs/Pleura: Stable scattered tiny pulmonary nodules. No new suspicious pulmonary nodules or masses. For reference: -Pulmonary nodules in the bilateral lung apices measure 2 mm on image 21/6 -right upper lobe pulmonary nodule measures 2 mm on image 27/6. Mild paraseptal emphysema.  No pleural effusion.  No pneumothorax. Musculoskeletal: No aggressive lytic or blastic lesion of bone. Remote left rib fracture. Mild thoracic spondylosis. CT ABDOMEN PELVIS FINDINGS Hepatobiliary: No suspicious hepatic lesion identified. Gallbladder is unremarkable. No biliary ductal dilation. Pancreas: No pancreatic ductal dilation or evidence of acute inflammation. Stable 4 mm cyst in the pancreatic head on image 68/2 favor a side branch IPMN. Spleen: No splenomegaly or focal splenic lesion. Adrenals/Urinary Tract: Bilateral adrenal glands appear normal. No hydronephrosis. Similar chronic right renal atrophy. Urinary bladder is unremarkable for degree of distension. Stomach/Bowel: Radiopaque enteric contrast material traverses distal loops of small bowel. Stomach is unremarkable for degree of distension. No pathologic  dilation of small or large bowel. No evidence of acute bowel inflammation. Vascular/Lymphatic: Aortic atherosclerosis. Similar ectasia of the infrarenal abdominal aorta measuring 2.5 cm, requiring no independent imaging follow-up. Smooth IVC contours. No pathologically enlarged abdominal or pelvic lymph nodes. Reproductive: Uterus is surgically absent without new enhancing nodularity along the vaginal cuff. No suspicious adnexal mass. Other: No significant abdominopelvic free fluid. Small fat containing paraumbilical hernia. Musculoskeletal: No aggressive lytic or blastic lesion of bone. Multilevel degenerative changes spine. Degenerative grade 1 L5 on S1 anterolisthesis. Degenerative change of the hips. IMPRESSION: 1. Stable examination without evidence of new or progressive disease in the chest, abdomen or pelvis. 2. Stable thoracic lymph nodes and scattered tiny pulmonary nodules. 3. 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 Recommend attention on follow-up imaging. 4. Aortic Atherosclerosis (ICD10-I70.0) and Emphysema (ICD10-J43.9). Electronically Signed   By: Maudry Mayhew M.D.   On: 07/03/2023 15:43      Metastasis to lung ([name])  01/28/2022 Initial Diagnosis   Metastasis to lung ([name])   11/06/2022 -  Chemotherapy   Patient is on Treatment Plan : UTERINE ENDOMETRIAL Dostarlimab-gxly (500 mg) + Carboplatin (AUC 5) + Paclitaxel (175 mg/m2) q21d x 6 cycles / Dostarlimab-gxly (1000 mg) q42d x 6 cycles        PHYSICAL EXAMINATION: ECOG PERFORMANCE STATUS: 0 - Asymptomatic  Vitals:   08/30/23 1359  BP: (!) 149/82  Pulse: 67  Resp: 18  Temp: 98 F (36.7 C)  SpO2: 98%   Filed Weights   08/30/23 1359  Weight: 148 lb 6.4 oz (67.3 kg)    GENERAL:alert, no distress and comfortable   LABORATORY DATA:  I have reviewed the data as listed    Component Value Date/Time   NA 138 08/30/2023 1332   NA 141 02/25/2019 1222   K 3.5 08/30/2023 1332   CL 104 08/30/2023 1332    CO2 28 08/30/2023 1332   GLUCOSE 157 (H) 08/30/2023 1332   BUN 38 (H) 08/30/2023 1332   BUN 17 02/25/2019 1222   CREATININE 1.46 (H) 08/30/2023 1332   CREATININE 1.64 (H) 07/10/2023 1123   CALCIUM 8.9 08/30/2023 1332   PROT 6.9 08/30/2023 1332  PROT 6.9 09/22/2019 1419   ALBUMIN 3.7 08/30/2023 1332   ALBUMIN 3.9 09/22/2019 1419   AST 13 (L) 08/30/2023 1332   AST 14 (L) 07/10/2023 1123   ALT 8 08/30/2023 1332   ALT 10 07/10/2023 1123   ALKPHOS 44 08/30/2023 1332   BILITOT 0.5 08/30/2023 1332   BILITOT 0.5 07/10/2023 1123   GFRNONAA 41 (L) 08/30/2023 1332   GFRNONAA 36 (L) 07/10/2023 1123   GFRAA >60 05/16/2019 0335    No results found for: "SPEP", "UPEP"  Lab Results  Component Value Date   WBC 4.8 08/30/2023   NEUTROABS 2.1 08/30/2023   HGB 11.9 (L) 08/30/2023   HCT 36.0 08/30/2023   MCV 85.3 08/30/2023   PLT 179 08/30/2023      Chemistry      Component Value Date/Time   NA 138 08/30/2023 1332   NA 141 02/25/2019 1222   K 3.5 08/30/2023 1332   CL 104 08/30/2023 1332   CO2 28 08/30/2023 1332   BUN 38 (H) 08/30/2023 1332   BUN 17 02/25/2019 1222   CREATININE 1.46 (H) 08/30/2023 1332   CREATININE 1.64 (H) 07/10/2023 1123      Component Value Date/Time   CALCIUM 8.9 08/30/2023 1332   ALKPHOS 44 08/30/2023 1332   AST 13 (L) 08/30/2023 1332   AST 14 (L) 07/10/2023 1123   ALT 8 08/30/2023 1332   ALT 10 07/10/2023 1123   BILITOT 0.5 08/30/2023 1332   BILITOT 0.5 07/10/2023 1123

## 2023-08-30 NOTE — Assessment & Plan Note (Signed)
Her last imaging in November 2024 showed complete response to treatment with no signs of cancer recurrence We will continue maintenance immunotherapy I plan to repeat imaging study again in March 2025

## 2023-08-30 NOTE — Patient Instructions (Signed)
 [facility] - A DEPT OF [facility]  Discharge Instructions: Thank you for choosing [facility] to provide your oncology and hematology care.   If you have a lab appointment with the Cancer Center, please go directly to the Cancer Center and check in at the registration area.   Wear comfortable clothing and clothing appropriate for easy access to any Portacath or PICC line.   We strive to give you quality time with your provider. You may need to reschedule your appointment if you arrive late (15 or more minutes).  Arriving late affects you and other patients whose appointments are after yours.  Also, if you miss three or more appointments without notifying the office, you may be dismissed from the clinic at the provider's discretion.      For prescription refill requests, have your pharmacy contact our office and allow 72 hours for refills to be completed.    Today you received the following chemotherapy and/or immunotherapy agents Jemperli      To help prevent nausea and vomiting after your treatment, we encourage you to take your nausea medication as directed.  BELOW ARE SYMPTOMS THAT SHOULD BE REPORTED IMMEDIATELY: *FEVER GREATER THAN 100.4 F (38 C) OR HIGHER *CHILLS OR SWEATING *NAUSEA AND VOMITING THAT IS NOT CONTROLLED WITH YOUR NAUSEA MEDICATION *UNUSUAL SHORTNESS OF BREATH *UNUSUAL BRUISING OR BLEEDING *URINARY PROBLEMS (pain or burning when urinating, or frequent urination) *BOWEL PROBLEMS (unusual diarrhea, constipation, pain near the anus) TENDERNESS IN MOUTH AND THROAT WITH OR WITHOUT PRESENCE OF ULCERS (sore throat, sores in mouth, or a toothache) UNUSUAL RASH, SWELLING OR PAIN  UNUSUAL VAGINAL DISCHARGE OR ITCHING   Items with * indicate a potential emergency and should be followed up as soon as possible or go to the Emergency Department if any problems should occur.  Please show the CHEMOTHERAPY ALERT CARD or IMMUNOTHERAPY  ALERT CARD at check-in to the Emergency Department and triage nurse.  Should you have questions after your visit or need to cancel or reschedule your appointment, please contact [facility]  Dept: [phone]  and follow the prompts.  Office hours are 8:00 a.m. to 4:30 p.m. Monday - Friday. Please note that voicemails left after 4:00 p.m. may not be returned until the following business day.  We are closed weekends and major holidays. You have access to a nurse at all times for urgent questions. Please call the main number to the clinic Dept: [phone] and follow the prompts.   For any non-urgent questions, you may also contact your provider using MyChart. We now offer e-Visits for anyone 23 and older to request care online for non-urgent symptoms. For details visit [email or web address].   Also download the MyChart app! Go to the app store, search "MyChart", open the app, select [place], and log in with your MyChart username and password.

## 2023-08-30 NOTE — Assessment & Plan Note (Signed)
 Her blood pressure is better controlled She will continue current antihypertensives

## 2023-08-31 LAB — T4: T4, Total: 6.6 ug/dL (ref 4.5–12.0)

## 2023-09-17 ENCOUNTER — Encounter: Payer: Self-pay | Admitting: Hematology and Oncology

## 2023-10-04 ENCOUNTER — Encounter: Payer: Self-pay | Admitting: Hematology and Oncology

## 2023-10-11 ENCOUNTER — Inpatient Hospital Stay (HOSPITAL_BASED_OUTPATIENT_CLINIC_OR_DEPARTMENT_OTHER): Payer: Medicare HMO | Admitting: Hematology and Oncology

## 2023-10-11 ENCOUNTER — Inpatient Hospital Stay: Payer: Medicare HMO | Attending: Hematology and Oncology

## 2023-10-11 ENCOUNTER — Inpatient Hospital Stay: Payer: Medicare HMO

## 2023-10-11 ENCOUNTER — Encounter: Payer: Self-pay | Admitting: Hematology and Oncology

## 2023-10-11 VITALS — BP 137/76 | HR 72 | Temp 97.9°F | Resp 18 | Ht 63.0 in | Wt 153.4 lb

## 2023-10-11 DIAGNOSIS — N183 Chronic kidney disease, stage 3 unspecified: Secondary | ICD-10-CM

## 2023-10-11 DIAGNOSIS — C7802 Secondary malignant neoplasm of left lung: Secondary | ICD-10-CM | POA: Insufficient documentation

## 2023-10-11 DIAGNOSIS — D6481 Anemia due to antineoplastic chemotherapy: Secondary | ICD-10-CM

## 2023-10-11 DIAGNOSIS — T451X5A Adverse effect of antineoplastic and immunosuppressive drugs, initial encounter: Secondary | ICD-10-CM

## 2023-10-11 DIAGNOSIS — C541 Malignant neoplasm of endometrium: Secondary | ICD-10-CM

## 2023-10-11 DIAGNOSIS — Z79620 Long term (current) use of immunosuppressive biologic: Secondary | ICD-10-CM | POA: Insufficient documentation

## 2023-10-11 DIAGNOSIS — Z5112 Encounter for antineoplastic immunotherapy: Secondary | ICD-10-CM | POA: Insufficient documentation

## 2023-10-11 DIAGNOSIS — C7801 Secondary malignant neoplasm of right lung: Secondary | ICD-10-CM | POA: Insufficient documentation

## 2023-10-11 DIAGNOSIS — C7800 Secondary malignant neoplasm of unspecified lung: Secondary | ICD-10-CM | POA: Diagnosis present

## 2023-10-11 LAB — CBC WITH DIFFERENTIAL (CANCER CENTER ONLY)
Abs Immature Granulocytes: 0 10*3/uL (ref 0.00–0.07)
Basophils Absolute: 0 10*3/uL (ref 0.0–0.1)
Basophils Relative: 0 %
Eosinophils Absolute: 0.4 10*3/uL (ref 0.0–0.5)
Eosinophils Relative: 7 %
HCT: 34.2 % — ABNORMAL LOW (ref 36.0–46.0)
Hemoglobin: 11.1 g/dL — ABNORMAL LOW (ref 12.0–15.0)
Immature Granulocytes: 0 %
Lymphocytes Relative: 43 %
Lymphs Abs: 2.3 10*3/uL (ref 0.7–4.0)
MCH: 27.8 pg (ref 26.0–34.0)
MCHC: 32.5 g/dL (ref 30.0–36.0)
MCV: 85.5 fL (ref 80.0–100.0)
Monocytes Absolute: 0.6 10*3/uL (ref 0.1–1.0)
Monocytes Relative: 11 %
Neutro Abs: 2.1 10*3/uL (ref 1.7–7.7)
Neutrophils Relative %: 39 %
Platelet Count: 244 10*3/uL (ref 150–400)
RBC: 4 MIL/uL (ref 3.87–5.11)
RDW: 15.5 % (ref 11.5–15.5)
WBC Count: 5.4 10*3/uL (ref 4.0–10.5)
nRBC: 0 % (ref 0.0–0.2)

## 2023-10-11 LAB — CMP (CANCER CENTER ONLY)
ALT: 7 U/L (ref 0–44)
AST: 10 U/L — ABNORMAL LOW (ref 15–41)
Albumin: 3.5 g/dL (ref 3.5–5.0)
Alkaline Phosphatase: 41 U/L (ref 38–126)
Anion gap: 4 — ABNORMAL LOW (ref 5–15)
BUN: 29 mg/dL — ABNORMAL HIGH (ref 6–20)
CO2: 31 mmol/L (ref 22–32)
Calcium: 9 mg/dL (ref 8.9–10.3)
Chloride: 102 mmol/L (ref 98–111)
Creatinine: 1.18 mg/dL — ABNORMAL HIGH (ref 0.44–1.00)
GFR, Estimated: 53 mL/min — ABNORMAL LOW (ref >60)
Glucose, Bld: 96 mg/dL (ref 70–99)
Potassium: 3.8 mmol/L (ref 3.5–5.1)
Sodium: 137 mmol/L (ref 135–145)
Total Bilirubin: 0.4 mg/dL (ref 0.0–1.2)
Total Protein: 7.3 g/dL (ref 6.5–8.1)

## 2023-10-11 LAB — TSH: TSH: 1.582 u[IU]/mL (ref 0.350–4.500)

## 2023-10-11 MED ORDER — HEPARIN SOD (PORK) LOCK FLUSH 100 UNIT/ML IV SOLN
500.0000 [IU] | Freq: Once | INTRAVENOUS | Status: AC | PRN
  Administered 2023-10-11: 500 [IU]

## 2023-10-11 MED ORDER — SODIUM CHLORIDE 0.9% FLUSH
10.0000 mL | INTRAVENOUS | Status: DC | PRN
  Administered 2023-10-11: 10 mL

## 2023-10-11 MED ORDER — SODIUM CHLORIDE 0.9% FLUSH
10.0000 mL | Freq: Once | INTRAVENOUS | Status: AC
  Administered 2023-10-11: 10 mL

## 2023-10-11 MED ORDER — SODIUM CHLORIDE 0.9 % IV SOLN: Freq: Once | INTRAVENOUS | Status: AC

## 2023-10-11 MED ORDER — SODIUM CHLORIDE 0.9 % IV SOLN
1000.0000 mg | Freq: Once | INTRAVENOUS | Status: AC
  Administered 2023-10-11: 1000 mg via INTRAVENOUS
  Filled 2023-10-11: qty 20

## 2023-10-11 NOTE — Assessment & Plan Note (Addendum)
 This is likely due to anemia of chronic illness, stable, monitor

## 2023-10-11 NOTE — Assessment & Plan Note (Addendum)
 She has intermittent acute on chronic renal failure We discussed importance of hydration and risk factor modification She can proceed as long as creatinine is less than 2

## 2023-10-11 NOTE — Assessment & Plan Note (Addendum)
 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

## 2023-10-11 NOTE — Patient Instructions (Signed)
 [facility]  Discharge Instructions: Thank you for choosing [facility] to provide your oncology and hematology care.   If you have a lab appointment with the Cancer Center, please go directly to the Cancer Center and check in at the registration area.   Wear comfortable clothing and clothing appropriate for easy access to any Portacath or PICC line.   We strive to give you quality time with your provider. You may need to reschedule your appointment if you arrive late (15 or more minutes).  Arriving late affects you and other patients whose appointments are after yours.  Also, if you miss three or more appointments without notifying the office, you may be dismissed from the clinic at the provider's discretion.      For prescription refill requests, have your pharmacy contact our office and allow 72 hours for refills to be completed.    Today you received the following chemotherapy and/or immunotherapy agents: Jemperli      To help prevent nausea and vomiting after your treatment, we encourage you to take your nausea medication as directed.  BELOW ARE SYMPTOMS THAT SHOULD BE REPORTED IMMEDIATELY: *FEVER GREATER THAN 100.4 F (38 C) OR HIGHER *CHILLS OR SWEATING *NAUSEA AND VOMITING THAT IS NOT CONTROLLED WITH YOUR NAUSEA MEDICATION *UNUSUAL SHORTNESS OF BREATH *UNUSUAL BRUISING OR BLEEDING *URINARY PROBLEMS (pain or burning when urinating, or frequent urination) *BOWEL PROBLEMS (unusual diarrhea, constipation, pain near the anus) TENDERNESS IN MOUTH AND THROAT WITH OR WITHOUT PRESENCE OF ULCERS (sore throat, sores in mouth, or a toothache) UNUSUAL RASH, SWELLING OR PAIN  UNUSUAL VAGINAL DISCHARGE OR ITCHING   Items with * indicate a potential emergency and should be followed up as soon as possible or go to the Emergency Department if any problems should occur.  Please show the CHEMOTHERAPY ALERT CARD or IMMUNOTHERAPY  ALERT CARD at check-in to the Emergency Department and triage nurse.  Should you have questions after your visit or need to cancel or reschedule your appointment, please contact [facility]  Dept: [phone]  and follow the prompts.  Office hours are 8:00 a.m. to 4:30 p.m. Monday - Friday. Please note that voicemails left after 4:00 p.m. may not be returned until the following business day.  We are closed weekends and major holidays. You have access to a nurse at all times for urgent questions. Please call the main number to the clinic Dept: [phone] and follow the prompts.   For any non-urgent questions, you may also contact your provider using MyChart. We now offer e-Visits for anyone 35 and older to request care online for non-urgent symptoms. For details visit [email or web address].   Also download the MyChart app! Go to the app store, search "MyChart", open the app, select [place], and log in with your MyChart username and password.

## 2023-10-11 NOTE — Progress Notes (Signed)
 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, High grade serous MSI stable, Her2/neu 3+, Er/PR neg  Her last imaging in November 2024 showed complete response to treatment with no signs of cancer recurrence We will continue maintenance immunotherapy I plan to repeat imaging study again in April 2025 Stage 3 chronic kidney disease, unspecified whether stage 3a or 3b CKD ([name]) She has intermittent acute on chronic renal failure We discussed importance of hydration and risk factor modification She can proceed as long as creatinine is less than 2 Anemia due to antineoplastic chemotherapy This is likely due to anemia of chronic illness, stable, monitor  Orders Placed This Encounter  Procedures   CT CHEST ABDOMEN PELVIS W CONTRAST    Standing Status:   Future    Expected Date:   11/15/2023    Expiration Date:   10/10/2024    If indicated for the ordered procedure, I authorize the administration of contrast media per Radiology protocol:   Yes    Does the patient have a contrast media/X-ray dye allergy?:   No    Preferred imaging location?:   [facility]    If indicated for the ordered procedure, I authorize the administration of oral contrast media per Radiology protocol:   Yes    Is patient pregnant?:   No     Artis Delay, MD  INTERVAL HISTORY: she returns for chemo follow-up Complications related to previous cycle of chemotherapy included anemia,  PHYSICAL EXAMINATION: ECOG PERFORMANCE STATUS: 0 - Asymptomatic  Vitals:   10/11/23 1212  BP: 137/76  Pulse: 72  Resp: 18  Temp: 97.9 F (36.6 C)  SpO2: 100%   Filed Weights   10/11/23 1212  Weight: 153 lb 6.4  oz (69.6 kg)    Relevant data for this visit included renal functions normal, mildly abnormal but acceptable, hemogram normal, mildly abnormal but acceptable  SUMMARY OF ONCOLOGIC HISTORY: Oncology History Overview Note  High grade serous MSI stable, Her2/neu 3+, 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    12/14/2020 Imaging   CT chest abdomen and pelvis IMPRESSION:  1.  2 small nodules in the right lung, nonspecific.  2.  Prominent precarinal lymph node versus adjacent small nodes.  3.  Enlarged heterogeneous uterine fundus.  4.  No definite adenopathy in the abdomen or pelvis. Nonspecific small lymph nodes are present, not enlarged by CT criteria.  5.  Mild wall thickening of the anterior aspect of the urinary bladder which may be due to to nondistention or inflammation.    12/27/2020 Pathology Results   SPECIMEN     Procedure:    Total hysterectomy and bilateral salpingo-oophorectomy   TUMOR     Histologic Type:    Serous carcinoma     Histologic Grade:    Not applicable     Myometrial Invasion:    Present       Depth of Myometrial Invasion:    11 mm       Myometrial Thickness:    12 mm       Percentage of Myometrial  Invasion:    92 %     Uterine Serosa Involvement:    Not identified     Cervical Stromal Involvement:    Not identified     Other Tissue / Organ Involvement:    Not identified     Peritoneal / Ascitic Fluid:    Not identified     Lymphovascular Invasion (LVI):    Present   MARGINS     Margin Status:    Not applicable   REGIONAL LYMPH NODES     Regional Lymph Node Status:           :    All regional lymph nodes negative for tumor cells         Lymph Nodes Examined:               Total Number of Pelvic Nodes Examined:    3           Number of Pelvic Sentinel Nodes Examined:    3           Total Number of Para-aortic Nodes  Examined:    0           Number of Para-aortic Sentinel Nodes Examined:    Not applicable   DISTANT METASTASIS     Distant Site(s) Involved:    Not applicable   PATHOLOGIC STAGE CLASSIFICATION (pTNM, AJCC 8th Edition)     Reporting of pT, pN, and (when applicable) pM categories is based on information available to the pathologist at the time the report is issued. As per the AJCC (Chapter 1, 8th Ed.) it is the managing physician's responsibility to establish the final pathologic stage based upon all pertinent information, including but potentially not limited to this pathology report.     TNM Descriptors:    Not applicable     Tumor Modifier:    Not applicable     pT Category:    pT1b     Regional Lymph Nodes Modifier:    (sn)     pN Category:    pN0   The endometrial adenocarcinoma was analyzed by IHC for DNA mismatch repair proteins.  The neoplasm retained nuclear expression of all four mismatch repair proteins, MLH1, PMS2, MSH2, MSH6.    Estrogen receptors (ER): Negative Progesterone receptors (PR): Negative  Controls worked appropriately.   A HER2 stain is POSITIVE (3+).   12/27/2020 Surgery   At [facility], she underwent a robotic hysterectomy/BSO, sentinel node dissection and partial omentectomy. She was found to have an enlarged uterus. An enlarged right pelvic sentinel node.    02/23/2021 Echocardiogram   Left Ventricle: Systolic function is normal. EF: 55-60%.    Left Ventricle: Doppler parameters consistent with mild diastolic  dysfunction and low to normal LA pressure.    Left Ventricle: There is mild concentric hypertrophy.    Aortic Valve: Mild aortic valve regurgitation.   01/27/2022 Imaging   CT chest imaging 1. No pulmonary embolus. 2. Findings consistent with intrathoracic malignancy. Multiple pulmonary nodules throughout both lungs of varying sizes, consistent with metastatic disease. Primary site of malignancy may represent a 4 cm spiculated nodule in the right  perihilar region spanning the fissure that is cavitating. Alternatively all of these nodules may be metastatic given history of cancer, type not specified.  3. Multifocal mediastinal and bilateral hilar adenopathy, consistent metastatic disease. 4. Nondisplaced left anterior seventh and eighth rib fractures, soft tissue thickening adjacent to the seventh rib fracture suggests this may be pathologic 5. Right  hydronephrosis is partially included in the field of view in the upper abdomen. Small soft tissue density medial to the right hepatic lobe posterior to the right kidney is nonspecific but may represent metastatic disease. Recommend staging CT of the abdomen and pelvis with oral and IV contrast. 6. Moderate emphysema. 7. Aortic atherosclerosis.  Coronary artery calcifications.   01/28/2022 Initial Diagnosis   Endometrial cancer ([name])   01/28/2022 Cancer Staging   Staging form: Corpus Uteri - Carcinoma and Carcinosarcoma, AJCC 8th Edition - Clinical stage from 01/28/2022: FIGO Stage IVB (rcT1b, cN2a, cM1) - Signed by Artis Delay, MD on 01/28/2022 Stage prefix: Recurrence   01/28/2022 Imaging   CT abdomen and pelvis 1. Multiple abdominal and pelvic metastatic implants as above. 2. Moderate right hydronephrosis secondary to mass effect and obstruction of the distal right ureter by a right pelvic metastatic implant. 3. Multiple pulmonary metastatic disease in the visualized right lung base. 4. No bowel obstruction. Normal appendix. 5. Aortic Atherosclerosis (ICD10-I70.0).   01/30/2022 Echocardiogram    1. Left ventricular ejection fraction, by estimation, is 55 to 60%. The left ventricle has normal function. The left ventricle has no regional wall motion abnormalities. There is moderate concentric left ventricular  hypertrophy. Left ventricular diastolic parameters are consistent with Grade I diastolic dysfunction (impaired relaxation).   2. Right ventricular systolic function is normal. The right  ventricular size is normal. There is normal pulmonary artery systolic pressure.   3. Left atrial size was mildly dilated.   4. The mitral valve is normal in structure. Trivial mitral valve regurgitation. No evidence of mitral stenosis.   5. The aortic valve is tricuspid. Aortic valve regurgitation is trivial. No aortic stenosis is present.   6. Aortic dilatation noted. There is mild dilatation of the aortic root, measuring 39 mm.   7. The inferior vena cava is normal in size with <50% respiratory variability, suggesting right atrial pressure of 8 mmHg.    01/31/2022 - 04/04/2022 Chemotherapy   Patient is on Treatment Plan : UTERINE SEROUS CARCINOMA Carboplatin + Paclitaxel + Trastuzumab q21d x 6 Cycles / Trastuzumab q21d     01/31/2022 - 05/18/2022 Chemotherapy   Patient is on Treatment Plan : UTERINE SEROUS CARCINOMA Carboplatin + Paclitaxel + Trastuzumab q21d x 6 Cycles / Trastuzumab q21d     04/03/2022 Imaging   1. Pulmonary nodules are either reduced in size or completely resolved. No new nodules. 2. Marked reduction in size peritoneal nodular metastasis. Multiple nodular lesions are no longer measurable. 3. Interval reduction in size of implant in the RIGHT operator space. While this implant is smaller, lesion continues to obstruct the RIGHT ureter with persistent RIGHT hydronephrosis and hydroureter. 4. No new metastatic lesions are present.   05/09/2022 Echocardiogram    1. Left ventricular ejection fraction, by estimation, is 45 to 50%. The left ventricle has mildly decreased function. The left ventricle demonstrates global hypokinesis. There is moderate concentric left ventricular hypertrophy. Left ventricular diastolic parameters are consistent with Grade I diastolic dysfunction (impaired relaxation). The average left ventricular global longitudinal strain is -10.8 %. The global longitudinal strain is abnormal.  2. Right ventricular systolic function is normal. The right ventricular size is  normal.  3. The mitral valve is normal in structure. No evidence of mitral valve regurgitation.  4. The aortic valve is tricuspid. Aortic valve regurgitation is mild.  5. There is mild dilatation of the ascending aorta, measuring 39 mm.  6. The inferior vena cava is normal in size with <50%  respiratory variability, suggesting right atrial pressure of 8 mmHg.   07/24/2022 Imaging   1. Improving appearance of sequela of metastatic disease. RIGHT pelvic sidewall mass obstructing the RIGHT ureter is no longer clearly seen. LEFT retroperitoneal soft tissue has diminished. 2. No signs of pulmonary metastatic disease or metastatic disease to the chest. 3. Mild RIGHT hydroureter without hydronephrosis. Resolution of hydronephrosis but with further atrophy of the RIGHT kidney. 4. Aortic atherosclerosis and 3 vessel coronary artery disease.   Aortic Atherosclerosis (ICD10-I70.0).   10/25/2022 Imaging   1. New pathologically enlarged mediastinal and left hilar lymph nodes as well as redemonstration previously treated/resolved pulmonary nodules, consistent with thoracic metastatic disease. Consider further evaluation by nuclear medicine PET-CT. 2. New subtle 14 mm hypodense lesion in the hepatic dome with increased conspicuity of a previously treated hepatic metastatic lesion in the right lobe of the liver, suspicious for new/recurrent hepatic metastatic disease. 3. Interval increase in the soft tissue thickening adjacent to loops of bowel in the right lower quadrant near the right external iliac artery, suspicious for recurrent disease. 4. Slight interval decrease in size of the left retroperitoneal soft tissue nodule. 5. Small focus of soft tissue nodularity along the left vaginal cuff is similar prior. 6.  Aortic Atherosclerosis (ICD10-I70.0).   11/06/2022 -  Chemotherapy   Patient is on Treatment Plan : UTERINE ENDOMETRIAL Dostarlimab-gxly (500 mg) + Carboplatin (AUC 5) + Paclitaxel (175 mg/m2) q21d x  6 cycles / Dostarlimab-gxly (1000 mg) q42d x 6 cycles      01/23/2023 Imaging   CT CHEST ABDOMEN PELVIS W CONTRAST  Result Date: 01/22/2023 CLINICAL DATA:  Follow-up metastatic endometrial carcinoma. Undergoing chemotherapy. * Tracking Code: BO * EXAM: CT CHEST, ABDOMEN, AND PELVIS WITH CONTRAST TECHNIQUE: Multidetector CT imaging of the chest, abdomen and pelvis was performed following the standard protocol during bolus administration of intravenous contrast. RADIATION DOSE REDUCTION: This exam was performed according to the departmental dose-optimization program which includes automated exposure control, adjustment of the mA and/or kV according to patient size and/or use of iterative reconstruction technique. CONTRAST:  80mL OMNIPAQUE IOHEXOL 300 MG/ML  SOLN COMPARISON:  10/24/2022 FINDINGS: CT CHEST FINDINGS Cardiovascular: No acute findings. Mediastinum/Lymph Nodes: Mediastinal and left hilar lymphadenopathy is decreased since previous study. Precarinal lymph node currently measures 11 mm on image 26/2 compared to 15 mm previously. Previously seen left hilar lymphadenopathy is no longer visualized. No new or increased sites of lymphadenopathy identified. Lungs/Pleura: Previously seen pulmonary nodule in the central left upper lobe has resolved since prior study. Other nodule in the superior segment of the right lower lobe has also resolved. No suspicious pulmonary nodules or masses identified. No evidence of infiltrate or pleural effusion. Musculoskeletal:  No suspicious bone lesions identified. CT ABDOMEN AND PELVIS FINDINGS Hepatobiliary: No suspicious liver lesions seen on today's exam. Previously suspected small lesion in the liver dome is no longer visualized. Gallbladder is unremarkable. No evidence of biliary ductal dilatation. Pancreas:  No mass or inflammatory changes. Spleen:  Within normal limits in size and appearance. Adrenals/Urinary tract: No suspicious masses or hydronephrosis. Chronic right  renal atrophy remains stable. Stomach/Bowel: No evidence of obstruction, inflammatory process, or abnormal fluid collections. Vascular/Lymphatic: No pathologically enlarged lymph nodes identified. No acute vascular findings. Aortic atherosclerotic calcification incidentally noted. Reproductive: Prior hysterectomy noted. Vaginal cuff is unremarkable in appearance. No pelvic mass identified. Adnexal regions are unremarkable in appearance. Other:  None. Musculoskeletal:  No suspicious bone lesions identified. IMPRESSION: Resolution of pulmonary metastases since prior exam. Interval  decrease in mediastinal and left hilar lymphadenopathy. Previously suspected small liver lesion and other subtle areas of soft tissue nodularity in the abdomen and pelvis are no longer visualized. No new or progressive disease. 1 Aortic Atherosclerosis (ICD10-I70.0). Electronically Signed   By: Danae Orleans M.D.   On: 01/22/2023 15:27      07/03/2023 Imaging   CT CHEST ABDOMEN PELVIS W CONTRAST  Result Date: 07/03/2023 CLINICAL DATA:  History of metastatic endometrial carcinoma, follow-up. * Tracking Code: BO * EXAM: CT CHEST, ABDOMEN, AND PELVIS WITH CONTRAST TECHNIQUE: Multidetector CT imaging of the chest, abdomen and pelvis was performed following the standard protocol during bolus administration of intravenous contrast. RADIATION DOSE REDUCTION: This exam was performed according to the departmental dose-optimization program which includes automated exposure control, adjustment of the mA and/or kV according to patient size and/or use of iterative reconstruction technique. CONTRAST:  80mL OMNIPAQUE IOHEXOL 300 MG/ML  SOLN COMPARISON:  Multiple priors including most recent CT January 18, 2023 FINDINGS: CT CHEST FINDINGS Cardiovascular: Accessed right chest Port-A-Cath with tip near the superior cavoatrial junction. Aortic atherosclerosis. No central pulmonary embolus on this nondedicated study. Coronary artery calcifications.  Mediastinum/Nodes: No suspicious thyroid nodule. Similar mediastinal adenopathy. No new or increased thoracic adenopathy identified. Precarinal lymph node measures 11 mm in short axis on image 26/2, unchanged. Lungs/Pleura: Stable scattered tiny pulmonary nodules. No new suspicious pulmonary nodules or masses. For reference: -Pulmonary nodules in the bilateral lung apices measure 2 mm on image 21/6 -right upper lobe pulmonary nodule measures 2 mm on image 27/6. Mild paraseptal emphysema.  No pleural effusion.  No pneumothorax. Musculoskeletal: No aggressive lytic or blastic lesion of bone. Remote left rib fracture. Mild thoracic spondylosis. CT ABDOMEN PELVIS FINDINGS Hepatobiliary: No suspicious hepatic lesion identified. Gallbladder is unremarkable. No biliary ductal dilation. Pancreas: No pancreatic ductal dilation or evidence of acute inflammation. Stable 4 mm cyst in the pancreatic head on image 68/2 favor a side branch IPMN. Spleen: No splenomegaly or focal splenic lesion. Adrenals/Urinary Tract: Bilateral adrenal glands appear normal. No hydronephrosis. Similar chronic right renal atrophy. Urinary bladder is unremarkable for degree of distension. Stomach/Bowel: Radiopaque enteric contrast material traverses distal loops of small bowel. Stomach is unremarkable for degree of distension. No pathologic dilation of small or large bowel. No evidence of acute bowel inflammation. Vascular/Lymphatic: Aortic atherosclerosis. Similar ectasia of the infrarenal abdominal aorta measuring 2.5 cm, requiring no independent imaging follow-up. Smooth IVC contours. No pathologically enlarged abdominal or pelvic lymph nodes. Reproductive: Uterus is surgically absent without new enhancing nodularity along the vaginal cuff. No suspicious adnexal mass. Other: No significant abdominopelvic free fluid. Small fat containing paraumbilical hernia. Musculoskeletal: No aggressive lytic or blastic lesion of bone. Multilevel degenerative  changes spine. Degenerative grade 1 L5 on S1 anterolisthesis. Degenerative change of the hips. IMPRESSION: 1. Stable examination without evidence of new or progressive disease in the chest, abdomen or pelvis. 2. Stable thoracic lymph nodes and scattered tiny pulmonary nodules. 3. 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 Recommend attention on follow-up imaging. 4. Aortic Atherosclerosis (ICD10-I70.0) and Emphysema (ICD10-J43.9). Electronically Signed   By: Maudry Mayhew M.D.   On: 07/03/2023 15:43      Metastasis to lung ([name])  01/28/2022 Initial Diagnosis   Metastasis to lung ([name])   11/06/2022 -  Chemotherapy   Patient is on Treatment Plan : UTERINE ENDOMETRIAL Dostarlimab-gxly (500 mg) + Carboplatin (AUC 5) + Paclitaxel (175 mg/m2) q21d x 6 cycles / Dostarlimab-gxly (1000 mg)  q42d x 6 cycles

## 2023-10-12 LAB — T4: T4, Total: 6.8 ug/dL (ref 4.5–12.0)

## 2023-10-24 ENCOUNTER — Ambulatory Visit
Admission: RE | Admit: 2023-10-24 | Discharge: 2023-10-24 | Disposition: A | Discharge status: Home / Self Care | Source: Ambulatory Visit | Attending: Family | Admitting: Family

## 2023-10-24 ENCOUNTER — Encounter: Payer: Self-pay | Admitting: Hematology and Oncology

## 2023-10-24 ENCOUNTER — Other Ambulatory Visit: Payer: Self-pay | Admitting: Family

## 2023-10-24 DIAGNOSIS — Z1231 Encounter for screening mammogram for malignant neoplasm of breast: Secondary | ICD-10-CM

## 2023-11-15 ENCOUNTER — Ambulatory Visit (HOSPITAL_COMMUNITY): Payer: Medicare HMO

## 2023-11-21 ENCOUNTER — Ambulatory Visit (HOSPITAL_COMMUNITY)
Admission: RE | Admit: 2023-11-21 | Discharge: 2023-11-21 | Disposition: A | Discharge status: Home / Self Care | Source: Ambulatory Visit | Attending: Hematology and Oncology | Admitting: Hematology and Oncology

## 2023-11-21 DIAGNOSIS — C541 Malignant neoplasm of endometrium: Secondary | ICD-10-CM | POA: Insufficient documentation

## 2023-11-21 DIAGNOSIS — N183 Chronic kidney disease, stage 3 unspecified: Secondary | ICD-10-CM | POA: Diagnosis present

## 2023-11-21 LAB — POCT I-STAT CREATININE: Creatinine, Ser: 1.3 mg/dL — ABNORMAL HIGH (ref 0.44–1.00)

## 2023-11-21 MED ORDER — HEPARIN SOD (PORK) LOCK FLUSH 100 UNIT/ML IV SOLN
500.0000 [IU] | Freq: Once | INTRAVENOUS | Status: AC
  Administered 2023-11-21: 100 [IU] via INTRAVENOUS

## 2023-11-21 MED ORDER — IOHEXOL 9 MG/ML PO SOLN
ORAL | Status: AC
  Filled 2023-11-21: qty 1000

## 2023-11-21 MED ORDER — IOHEXOL 300 MG/ML  SOLN
100.0000 mL | Freq: Once | INTRAMUSCULAR | Status: AC | PRN
  Administered 2023-11-21: 100 mL via INTRAVENOUS

## 2023-11-21 MED ORDER — SODIUM CHLORIDE (PF) 0.9 % IJ SOLN
INTRAMUSCULAR | Status: AC
  Filled 2023-11-21: qty 50

## 2023-11-21 MED ORDER — IOHEXOL 9 MG/ML PO SOLN
1000.0000 mL | ORAL | Status: AC
  Administered 2023-11-21: 1000 mL via ORAL

## 2023-11-22 ENCOUNTER — Inpatient Hospital Stay: Payer: Medicare HMO

## 2023-11-22 ENCOUNTER — Inpatient Hospital Stay: Payer: Medicare HMO | Attending: Hematology and Oncology

## 2023-11-22 ENCOUNTER — Inpatient Hospital Stay (HOSPITAL_BASED_OUTPATIENT_CLINIC_OR_DEPARTMENT_OTHER): Payer: Medicare HMO | Admitting: Hematology and Oncology

## 2023-11-22 ENCOUNTER — Encounter: Payer: Self-pay | Admitting: Hematology and Oncology

## 2023-11-22 VITALS — BP 113/80 | HR 65 | Temp 98.0°F | Resp 18 | Ht 63.0 in | Wt 156.0 lb

## 2023-11-22 DIAGNOSIS — C7801 Secondary malignant neoplasm of right lung: Secondary | ICD-10-CM

## 2023-11-22 DIAGNOSIS — C7802 Secondary malignant neoplasm of left lung: Secondary | ICD-10-CM | POA: Insufficient documentation

## 2023-11-22 DIAGNOSIS — Z5112 Encounter for antineoplastic immunotherapy: Secondary | ICD-10-CM | POA: Insufficient documentation

## 2023-11-22 DIAGNOSIS — N183 Chronic kidney disease, stage 3 unspecified: Secondary | ICD-10-CM | POA: Diagnosis not present

## 2023-11-22 DIAGNOSIS — C541 Malignant neoplasm of endometrium: Secondary | ICD-10-CM

## 2023-11-22 DIAGNOSIS — Z79620 Long term (current) use of immunosuppressive biologic: Secondary | ICD-10-CM | POA: Diagnosis not present

## 2023-11-22 DIAGNOSIS — I10 Essential (primary) hypertension: Secondary | ICD-10-CM

## 2023-11-22 LAB — CMP (CANCER CENTER ONLY)
ALT: 11 U/L (ref 0–44)
AST: 14 U/L — ABNORMAL LOW (ref 15–41)
Albumin: 3.6 g/dL (ref 3.5–5.0)
Alkaline Phosphatase: 43 U/L (ref 38–126)
Anion gap: 5 (ref 5–15)
BUN: 39 mg/dL — ABNORMAL HIGH (ref 6–20)
CO2: 29 mmol/L (ref 22–32)
Calcium: 9 mg/dL (ref 8.9–10.3)
Chloride: 104 mmol/L (ref 98–111)
Creatinine: 1.56 mg/dL — ABNORMAL HIGH (ref 0.44–1.00)
GFR, Estimated: 38 mL/min — ABNORMAL LOW (ref >60)
Glucose, Bld: 130 mg/dL — ABNORMAL HIGH (ref 70–99)
Potassium: 3.9 mmol/L (ref 3.5–5.1)
Sodium: 138 mmol/L (ref 135–145)
Total Bilirubin: 0.6 mg/dL (ref 0.0–1.2)
Total Protein: 7.3 g/dL (ref 6.5–8.1)

## 2023-11-22 LAB — CBC WITH DIFFERENTIAL (CANCER CENTER ONLY)
Abs Immature Granulocytes: 0.01 10*3/uL (ref 0.00–0.07)
Basophils Absolute: 0 10*3/uL (ref 0.0–0.1)
Basophils Relative: 0 %
Eosinophils Absolute: 0.3 10*3/uL (ref 0.0–0.5)
Eosinophils Relative: 5 %
HCT: 33.5 % — ABNORMAL LOW (ref 36.0–46.0)
Hemoglobin: 11.3 g/dL — ABNORMAL LOW (ref 12.0–15.0)
Immature Granulocytes: 0 %
Lymphocytes Relative: 43 %
Lymphs Abs: 2.2 10*3/uL (ref 0.7–4.0)
MCH: 28.3 pg (ref 26.0–34.0)
MCHC: 33.7 g/dL (ref 30.0–36.0)
MCV: 84 fL (ref 80.0–100.0)
Monocytes Absolute: 0.6 10*3/uL (ref 0.1–1.0)
Monocytes Relative: 10 %
Neutro Abs: 2.2 10*3/uL (ref 1.7–7.7)
Neutrophils Relative %: 42 %
Platelet Count: 224 10*3/uL (ref 150–400)
RBC: 3.99 MIL/uL (ref 3.87–5.11)
RDW: 16.3 % — ABNORMAL HIGH (ref 11.5–15.5)
WBC Count: 5.3 10*3/uL (ref 4.0–10.5)
nRBC: 0 % (ref 0.0–0.2)

## 2023-11-22 LAB — TSH: TSH: 2.255 u[IU]/mL (ref 0.350–4.500)

## 2023-11-22 MED ORDER — SODIUM CHLORIDE 0.9% FLUSH
10.0000 mL | INTRAVENOUS | Status: DC | PRN
  Administered 2023-11-22: 10 mL

## 2023-11-22 MED ORDER — SODIUM CHLORIDE 0.9 % IV SOLN
1000.0000 mg | Freq: Once | INTRAVENOUS | Status: AC
  Administered 2023-11-22: 1000 mg via INTRAVENOUS
  Filled 2023-11-22: qty 20

## 2023-11-22 MED ORDER — HEPARIN SOD (PORK) LOCK FLUSH 100 UNIT/ML IV SOLN
500.0000 [IU] | Freq: Once | INTRAVENOUS | Status: AC | PRN
  Administered 2023-11-22: 500 [IU]

## 2023-11-22 MED ORDER — LOSARTAN POTASSIUM 100 MG PO TABS: 100.0000 mg | ORAL_TABLET | Freq: Every day | ORAL | 0 refills | Status: DC

## 2023-11-22 MED ORDER — OXYCODONE HCL 5 MG PO TABS: 5.0000 mg | ORAL_TABLET | ORAL | 0 refills | Status: DC | PRN

## 2023-11-22 MED ORDER — SODIUM CHLORIDE 0.9 % IV SOLN: Freq: Once | INTRAVENOUS | Status: AC

## 2023-11-22 NOTE — Assessment & Plan Note (Addendum)
 She has intermittent acute on chronic renal failure We discussed importance of hydration and risk factor modification She can proceed as long as creatinine is less than 2

## 2023-11-22 NOTE — Patient Instructions (Signed)

## 2023-11-22 NOTE — Patient Instructions (Signed)
 [facility]  Discharge Instructions: Thank you for choosing [facility] to provide your oncology and hematology care.   If you have a lab appointment with the Cancer Center, please go directly to the Cancer Center and check in at the registration area.   Wear comfortable clothing and clothing appropriate for easy access to any Portacath or PICC line.   We strive to give you quality time with your provider. You may need to reschedule your appointment if you arrive late (15 or more minutes).  Arriving late affects you and other patients whose appointments are after yours.  Also, if you miss three or more appointments without notifying the office, you may be dismissed from the clinic at the provider's discretion.      For prescription refill requests, have your pharmacy contact our office and allow 72 hours for refills to be completed.    Today you received the following chemotherapy and/or immunotherapy agents: Jemperli      To help prevent nausea and vomiting after your treatment, we encourage you to take your nausea medication as directed.  BELOW ARE SYMPTOMS THAT SHOULD BE REPORTED IMMEDIATELY: *FEVER GREATER THAN 100.4 F (38 C) OR HIGHER *CHILLS OR SWEATING *NAUSEA AND VOMITING THAT IS NOT CONTROLLED WITH YOUR NAUSEA MEDICATION *UNUSUAL SHORTNESS OF BREATH *UNUSUAL BRUISING OR BLEEDING *URINARY PROBLEMS (pain or burning when urinating, or frequent urination) *BOWEL PROBLEMS (unusual diarrhea, constipation, pain near the anus) TENDERNESS IN MOUTH AND THROAT WITH OR WITHOUT PRESENCE OF ULCERS (sore throat, sores in mouth, or a toothache) UNUSUAL RASH, SWELLING OR PAIN  UNUSUAL VAGINAL DISCHARGE OR ITCHING   Items with * indicate a potential emergency and should be followed up as soon as possible or go to the Emergency Department if any problems should occur.  Please show the CHEMOTHERAPY ALERT CARD or IMMUNOTHERAPY  ALERT CARD at check-in to the Emergency Department and triage nurse.  Should you have questions after your visit or need to cancel or reschedule your appointment, please contact [facility]  Dept: [phone]  and follow the prompts.  Office hours are 8:00 a.m. to 4:30 p.m. Monday - Friday. Please note that voicemails left after 4:00 p.m. may not be returned until the following business day.  We are closed weekends and major holidays. You have access to a nurse at all times for urgent questions. Please call the main number to the clinic Dept: [phone] and follow the prompts.   For any non-urgent questions, you may also contact your provider using MyChart. We now offer e-Visits for anyone 35 and older to request care online for non-urgent symptoms. For details visit [email or web address].   Also download the MyChart app! Go to the app store, search "MyChart", open the app, select [place], and log in with your MyChart username and password.

## 2023-11-22 NOTE — Progress Notes (Signed)
 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, High grade serous MSI stable, Her2/neu 3+, Er/PR neg  Her last imaging in November 2024 showed complete response to treatment with no signs of cancer recurrence  Due to delay, her CT imaging was done late yesterday, formal report is not available By my own review, I did not see any signs of recurrent disease We will continue treatment without delay and I plan to keep an eye on the result and call her with that once it is available Assuming her CT imaging is stable, plan to repeat imaging study in August 2025 Malignant neoplasm metastatic to both lungs ([name])  Primary hypertension Her blood pressure is better controlled She will continue current antihypertensives I refilled multiple prescriptions for her today Stage 3 chronic kidney disease, unspecified whether stage 3a or 3b CKD ([name]) She has intermittent acute on chronic renal failure We discussed importance of hydration and risk factor modification She can proceed as long as creatinine is less than 2  Orders Placed This Encounter  Procedures   CBC with Differential (Cancer Center Only)    Standing Status:   Future    Expected Date:   01/03/2024    Expiration Date:   01/02/2025   CMP (Cancer Center only)    Standing Status:   Future    Expected Date:   01/03/2024    Expiration Date:   01/02/2025   T4    Standing Status:   Future    Expected Date:   01/03/2024    Expiration Date:   01/02/2025   TSH    Standing Status:   Future    Expected Date:   01/03/2024    Expiration Date:   01/02/2025   CBC with Differential (Cancer Center Only)    Standing Status:   Future    Expected  Date:   02/14/2024    Expiration Date:   02/13/2025   CMP (Cancer Center only)    Standing Status:   Future    Expected Date:   02/14/2024    Expiration Date:   02/13/2025   T4    Standing Status:   Future    Expected Date:   02/14/2024    Expiration Date:   02/13/2025   TSH    Standing Status:   Future    Expected Date:   02/14/2024    Expiration Date:   02/13/2025   CBC with Differential (Cancer Center Only)    Standing Status:   Future    Expected Date:   03/27/2024    Expiration Date:   03/27/2025   CMP (Cancer Center only)    Standing Status:   Future    Expected Date:   03/27/2024    Expiration Date:   03/27/2025   T4    Standing Status:   Future    Expected Date:   03/27/2024    Expiration Date:   03/27/2025   TSH    Standing Status:   Future    Expected Date:   03/27/2024    Expiration Date:   03/27/2025     Artis Delay, MD  INTERVAL HISTORY: she returns for treatment follow-up Complications related to previous cycle of chemotherapy  included anemia, and changes in kidney function She denies other new side effects Her blood pressure control has improved She denies recent smoking  PHYSICAL EXAMINATION: ECOG PERFORMANCE STATUS: 0 - Asymptomatic  Vitals:   11/22/23 1202  BP: 113/80  Pulse: 65  Resp: 18  Temp: 98 F (36.7 C)  SpO2: 98%   Filed Weights   11/22/23 1202  Weight: 156 lb (70.8 kg)    Relevant data reviewed during this visit included CBC, CMP and CT imaging from April 2025

## 2023-11-22 NOTE — Assessment & Plan Note (Addendum)
 Her blood pressure is better controlled She will continue current antihypertensives I refilled multiple prescriptions for her today

## 2023-11-22 NOTE — Assessment & Plan Note (Addendum)
 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

## 2023-11-23 LAB — T4: T4, Total: 7.5 ug/dL (ref 4.5–12.0)

## 2023-11-26 ENCOUNTER — Telehealth: Payer: Self-pay

## 2023-11-26 NOTE — Telephone Encounter (Signed)
-----   Message from Krista [name] sent at 11/25/2023 10:23 PM EDT ----- Ct is finally resulted, overall neg except for small lung nodule, will follow in her next CT

## 2023-11-26 NOTE — Telephone Encounter (Signed)
 Called and given below message. She verbalized understanding.

## 2024-01-03 ENCOUNTER — Inpatient Hospital Stay

## 2024-01-03 ENCOUNTER — Encounter: Payer: Self-pay | Admitting: Hematology and Oncology

## 2024-01-03 ENCOUNTER — Inpatient Hospital Stay (HOSPITAL_BASED_OUTPATIENT_CLINIC_OR_DEPARTMENT_OTHER): Admitting: Hematology and Oncology

## 2024-01-03 ENCOUNTER — Inpatient Hospital Stay: Attending: Hematology and Oncology

## 2024-01-03 ENCOUNTER — Inpatient Hospital Stay: Admitting: Licensed Clinical Social Worker

## 2024-01-03 VITALS — BP 158/99 | HR 60 | Resp 18

## 2024-01-03 VITALS — BP 163/106 | HR 63 | Temp 98.2°F | Resp 18 | Ht 63.0 in | Wt 161.2 lb

## 2024-01-03 DIAGNOSIS — Z79620 Long term (current) use of immunosuppressive biologic: Secondary | ICD-10-CM | POA: Insufficient documentation

## 2024-01-03 DIAGNOSIS — I10 Essential (primary) hypertension: Secondary | ICD-10-CM | POA: Diagnosis not present

## 2024-01-03 DIAGNOSIS — I427 Cardiomyopathy due to drug and external agent: Secondary | ICD-10-CM

## 2024-01-03 DIAGNOSIS — Z5112 Encounter for antineoplastic immunotherapy: Secondary | ICD-10-CM | POA: Insufficient documentation

## 2024-01-03 DIAGNOSIS — C541 Malignant neoplasm of endometrium: Secondary | ICD-10-CM

## 2024-01-03 DIAGNOSIS — C7801 Secondary malignant neoplasm of right lung: Secondary | ICD-10-CM

## 2024-01-03 DIAGNOSIS — T451X5A Adverse effect of antineoplastic and immunosuppressive drugs, initial encounter: Secondary | ICD-10-CM

## 2024-01-03 DIAGNOSIS — C7800 Secondary malignant neoplasm of unspecified lung: Secondary | ICD-10-CM | POA: Diagnosis present

## 2024-01-03 LAB — CBC WITH DIFFERENTIAL (CANCER CENTER ONLY)
Abs Immature Granulocytes: 0.06 10*3/uL (ref 0.00–0.07)
Basophils Absolute: 0 10*3/uL (ref 0.0–0.1)
Basophils Relative: 0 %
Eosinophils Absolute: 0.2 10*3/uL (ref 0.0–0.5)
Eosinophils Relative: 3 %
HCT: 36.7 % (ref 36.0–46.0)
Hemoglobin: 12.2 g/dL (ref 12.0–15.0)
Immature Granulocytes: 1 %
Lymphocytes Relative: 41 %
Lymphs Abs: 2.5 10*3/uL (ref 0.7–4.0)
MCH: 28 pg (ref 26.0–34.0)
MCHC: 33.2 g/dL (ref 30.0–36.0)
MCV: 84.4 fL (ref 80.0–100.0)
Monocytes Absolute: 0.7 10*3/uL (ref 0.1–1.0)
Monocytes Relative: 12 %
Neutro Abs: 2.6 10*3/uL (ref 1.7–7.7)
Neutrophils Relative %: 43 %
Platelet Count: 214 10*3/uL (ref 150–400)
RBC: 4.35 MIL/uL (ref 3.87–5.11)
RDW: 16.4 % — ABNORMAL HIGH (ref 11.5–15.5)
WBC Count: 6.1 10*3/uL (ref 4.0–10.5)
nRBC: 0 % (ref 0.0–0.2)

## 2024-01-03 LAB — CMP (CANCER CENTER ONLY)
ALT: 10 U/L (ref 0–44)
AST: 13 U/L — ABNORMAL LOW (ref 15–41)
Albumin: 3.7 g/dL (ref 3.5–5.0)
Alkaline Phosphatase: 44 U/L (ref 38–126)
Anion gap: 6 (ref 5–15)
BUN: 35 mg/dL — ABNORMAL HIGH (ref 6–20)
CO2: 28 mmol/L (ref 22–32)
Calcium: 8.9 mg/dL (ref 8.9–10.3)
Chloride: 104 mmol/L (ref 98–111)
Creatinine: 1.21 mg/dL — ABNORMAL HIGH (ref 0.44–1.00)
GFR, Estimated: 51 mL/min — ABNORMAL LOW (ref >60)
Glucose, Bld: 84 mg/dL (ref 70–99)
Potassium: 3.6 mmol/L (ref 3.5–5.1)
Sodium: 138 mmol/L (ref 135–145)
Total Bilirubin: 0.5 mg/dL (ref 0.0–1.2)
Total Protein: 7.1 g/dL (ref 6.5–8.1)

## 2024-01-03 LAB — TSH: TSH: 1.54 u[IU]/mL (ref 0.350–4.500)

## 2024-01-03 MED ORDER — SODIUM CHLORIDE 0.9% FLUSH
10.0000 mL | Freq: Once | INTRAVENOUS | Status: AC
  Administered 2024-01-03: 10 mL

## 2024-01-03 MED ORDER — SODIUM CHLORIDE 0.9% FLUSH
10.0000 mL | INTRAVENOUS | Status: DC | PRN
  Administered 2024-01-03: 10 mL

## 2024-01-03 MED ORDER — SODIUM CHLORIDE 0.9 % IV SOLN: Freq: Once | INTRAVENOUS | Status: AC

## 2024-01-03 MED ORDER — SODIUM CHLORIDE 0.9 % IV SOLN
1000.0000 mg | Freq: Once | INTRAVENOUS | Status: AC
  Administered 2024-01-03: 1000 mg via INTRAVENOUS
  Filled 2024-01-03: qty 20

## 2024-01-03 MED ORDER — OXYCODONE HCL 5 MG PO TABS: 5.0000 mg | ORAL_TABLET | ORAL | 0 refills | Status: DC | PRN

## 2024-01-03 MED ORDER — HEPARIN SOD (PORK) LOCK FLUSH 100 UNIT/ML IV SOLN
500.0000 [IU] | Freq: Once | INTRAVENOUS | Status: AC | PRN
  Administered 2024-01-03: 500 [IU]

## 2024-01-03 NOTE — Assessment & Plan Note (Addendum)
 She has not seen her cardiologist since 2024 and I did not see echocardiogram repeated since I recommend the patient to reach out to her cardiologist for follow-up She has no signs or symptoms of congestive heart failure

## 2024-01-03 NOTE — Patient Instructions (Signed)
 [facility]  Discharge Instructions: Thank you for choosing [facility] to provide your oncology and hematology care.   If you have a lab appointment with the Cancer Center, please go directly to the Cancer Center and check in at the registration area.   Wear comfortable clothing and clothing appropriate for easy access to any Portacath or PICC line.   We strive to give you quality time with your provider. You may need to reschedule your appointment if you arrive late (15 or more minutes).  Arriving late affects you and other patients whose appointments are after yours.  Also, if you miss three or more appointments without notifying the office, you may be dismissed from the clinic at the provider's discretion.      For prescription refill requests, have your pharmacy contact our office and allow 72 hours for refills to be completed.    Today you received the following chemotherapy and/or immunotherapy agents: Jemperli      To help prevent nausea and vomiting after your treatment, we encourage you to take your nausea medication as directed.  BELOW ARE SYMPTOMS THAT SHOULD BE REPORTED IMMEDIATELY: *FEVER GREATER THAN 100.4 F (38 C) OR HIGHER *CHILLS OR SWEATING *NAUSEA AND VOMITING THAT IS NOT CONTROLLED WITH YOUR NAUSEA MEDICATION *UNUSUAL SHORTNESS OF BREATH *UNUSUAL BRUISING OR BLEEDING *URINARY PROBLEMS (pain or burning when urinating, or frequent urination) *BOWEL PROBLEMS (unusual diarrhea, constipation, pain near the anus) TENDERNESS IN MOUTH AND THROAT WITH OR WITHOUT PRESENCE OF ULCERS (sore throat, sores in mouth, or a toothache) UNUSUAL RASH, SWELLING OR PAIN  UNUSUAL VAGINAL DISCHARGE OR ITCHING   Items with * indicate a potential emergency and should be followed up as soon as possible or go to the Emergency Department if any problems should occur.  Please show the CHEMOTHERAPY ALERT CARD or IMMUNOTHERAPY  ALERT CARD at check-in to the Emergency Department and triage nurse.  Should you have questions after your visit or need to cancel or reschedule your appointment, please contact [facility]  Dept: [phone]  and follow the prompts.  Office hours are 8:00 a.m. to 4:30 p.m. Monday - Friday. Please note that voicemails left after 4:00 p.m. may not be returned until the following business day.  We are closed weekends and major holidays. You have access to a nurse at all times for urgent questions. Please call the main number to the clinic Dept: [phone] and follow the prompts.   For any non-urgent questions, you may also contact your provider using MyChart. We now offer e-Visits for anyone 35 and older to request care online for non-urgent symptoms. For details visit [email or web address].   Also download the MyChart app! Go to the app store, search "MyChart", open the app, select [place], and log in with your MyChart username and password.
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
## 2024-01-03 NOTE — Assessment & Plan Note (Addendum)
 Her blood pressure is poorly controlled She forgot her blood pressure medication today but she is not symptomatic We will proceed with treatment as scheduled She will resume her antihypertensives as soon as she gets home

## 2024-01-03 NOTE — Assessment & Plan Note (Addendum)
 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

## 2024-01-03 NOTE — Progress Notes (Signed)
 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, High grade serous MSI stable, Her2/neu 3+, Er/PR neg  Her last imaging in November 2024 showed complete response to treatment with no signs of cancer recurrence  CT imaging from April showed nonspecific lung nodule but she is not symptomatic  we will continue treatment without delay  I plan to repeat imaging study in July 2025 Essential hypertension Her blood pressure is poorly controlled She forgot her blood pressure medication today but she is not symptomatic We will proceed with treatment as scheduled She will resume her antihypertensives as soon as she gets home 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    Almeda Jacobs, MD  INTERVAL HISTORY: she returns for treatment follow-up Complications related to previous cycle of chemotherapy included bone aches, and elevated BP Her blood pressure is elevated today She forgot to take her blood pressure medication today She has intermittent pain and she takes oxycodone  as needed, requested refill She has no clinical signs or symptoms of congestive heart failure We discussed importance of cardiology follow-up She denies recent smoking  PHYSICAL EXAMINATION: ECOG PERFORMANCE STATUS: 0 - Asymptomatic  Lab Results  Component Value Date   CAN125 145.0 (H) 01/29/2022      Latest Ref Rng & Units  01/03/2024   11:38 AM 11/22/2023   11:28 AM 10/11/2023   11:57 AM  CBC  WBC 4.0 - 10.5 K/uL 6.1  5.3  5.4   Hemoglobin 12.0 - 15.0 g/dL 86.5  78.4  69.6   Hematocrit 36.0 - 46.0 % 36.7  33.5  34.2   Platelets 150 - 400 K/uL 214  224  244       Chemistry      Component Value Date/Time   NA 138 11/22/2023 1128   NA 141 02/25/2019 1222   K 3.9 11/22/2023 1128   CL 104 11/22/2023 1128   CO2 29 11/22/2023 1128   BUN 39 (H) 11/22/2023 1128   BUN 17 02/25/2019 1222   CREATININE 1.56 (H) 11/22/2023 1128      Component Value Date/Time   CALCIUM  9.0 11/22/2023 1128   ALKPHOS 43 11/22/2023 1128   AST 14 (L) 11/22/2023 1128   ALT 11 11/22/2023 1128   BILITOT 0.6 11/22/2023 1128       Vitals:   01/03/24 1208 01/03/24 1213  BP: (!) 201/102 (!) 163/106  Pulse: 63   Resp: 18   Temp: 98.2 F (36.8 C)   SpO2: 100%    Filed Weights   01/03/24 1208  Weight: 161 lb 3.2 oz (73.1 kg)   Other relevant data reviewed during this visit included CBC and CMP

## 2024-01-04 LAB — T4: T4, Total: 6.9 ug/dL (ref 4.5–12.0)

## 2024-01-08 ENCOUNTER — Encounter: Payer: Self-pay | Admitting: Hematology and Oncology

## 2024-01-08 NOTE — Progress Notes (Signed)
 Called pt to introduce myself as her Dance movement psychotherapist and to discuss the [employer].  Pt would like to apply and needs some time to find her proof of income and SNAP benefits.  Once received she will be approved for the grant.

## 2024-01-22 ENCOUNTER — Telehealth (HOSPITAL_COMMUNITY): Payer: Self-pay | Admitting: Cardiology

## 2024-01-22 NOTE — Telephone Encounter (Signed)
 front office left pt a message to remind her of her appt scheduled with Dr. Bruce Caper on 01/23/2024 at 12PM. pt did not answer the telephone call to confirm so front office left pt a voice message.

## 2024-01-22 NOTE — Telephone Encounter (Signed)
 Called to confirm/remind patient of their appointment at the [facility] on 01/22/24 .   Appointment:   [] Confirmed  [x] Left mess   [] No answer/No voice mail  [] VM Full/unable to leave message  [] Phone not in service  Patient reminded to bring all medications and/or complete list.  Confirmed patient has transportation. Gave directions, instructed to utilize valet parking.

## 2024-01-22 NOTE — Progress Notes (Incomplete)
 ADVANCED HEART FAILURE CLINIC NOTE  Referring Physician: Vergil Glasser, MD  Primary Care: Vergil Glasser, MD Primary Cardiologist: Dr. Katheryne Pane Oncologist: Almeda Jacobs MD  HPI: Krista [name] is a 63 y.o. female with recently diagnosed heart failure with reduced ejection fraction, endometrial cancer, hypertension, type 2 diabetes and history of CAD status post PCI to the mid LAD in May 2020 presenting today to establish care.  Krista [name] was originally diagnosed with endometrial cancer  in 11/2020 via endometrial biopsy at [facility]. She underwent total hysterectomy and bilateral salpingo-oophorectomy with sentinal lymph node dissection and partial omentectomy. She received one cycle of taxol /carbo/herceptin  on 03/23/21 but was lost to follow up afterwards. In June 2023, she presented to [facility] for left sided chest pain after a fall. During that admission contrast CT showed multiple abdominal and pelvic mets from stage IV endometrial cancer.  Since that time she has followed with Dr. Marton Sleeper and is currently undergoing treatment with carboplatin +paclitaxel +trastuzumab  every 21 days for 6 cycles. In September 2023 TTE demonstrated a new reduction in LVEF and she was subsequently referred to cardio-oncology for further evaluation.   From a functional standpoint, Krista [name] is doing fairly well. She can perform ADLs without significant difficulty; does report some SOB but no chest pain, lightheadness or syncope.   Activity level/exercise tolerance:  NYHA IIB Orthopnea:  Sleeps on 2 pillows Paroxysmal noctural dyspnea:  no Chest pain/pressure:  no Orthostatic lightheadedness:  no Palpitations:  no Lower extremity edema:  intermittent, stable now Presyncope/syncope:  no Cough:  no   Current Outpatient Medications  Medication Sig Dispense Refill   acetaminophen  (TYLENOL ) 500 MG tablet Take 1,000 mg by mouth every 6 (six) hours as needed for mild pain or headache.     aspirin  EC 81 MG tablet  Take 1 tablet (81 mg total) by mouth daily. Swallow whole.     doxazosin  (CARDURA ) 4 MG tablet Take 1 tablet (4 mg total) by mouth daily. 30 tablet 3   Evolocumab  (REPATHA  SURECLICK) 140 MG/ML SOAJ Inject 140 mg into the skin every 14 (fourteen) days. 2 pen 11   hydrochlorothiazide  (HYDRODIURIL ) 25 MG tablet Take 1 tablet (25 mg total) by mouth daily. 30 tablet 3   lidocaine -prilocaine  (EMLA ) cream Apply 1 application topically as needed. 30 g 0   loratadine  (CLARITIN ) 10 MG tablet Take 1 tablet (10 mg total) by mouth daily as needed for allergies. 30 tablet 11   losartan  (COZAAR ) 100 MG tablet Take 1 tablet (100 mg total) by mouth daily. 90 tablet 0   metFORMIN  (GLUCOPHAGE ) 1000 MG tablet Take 1,000 mg by mouth 2 (two) times daily with a meal.     metoprolol  succinate (TOPROL  XL) 100 MG 24 hr tablet Take 1 tablet (100 mg total) by mouth daily. 30 tablet 3   nitroGLYCERIN  (NITROSTAT ) 0.4 MG SL tablet Place 1 tablet (0.4 mg total) under the tongue every 5 (five) minutes as needed for chest pain. 30 tablet 2   ondansetron  (ZOFRAN ) 8 MG tablet Take 1 tablet (8 mg total) by mouth every 8 (eight) hours as needed for nausea. 30 tablet 3   oxyCODONE  (OXY IR/ROXICODONE ) 5 MG immediate release tablet Take 1 tablet (5 mg total) by mouth every 4 (four) hours as needed for severe pain (pain score 7-10). 30 tablet 0   polyethylene glycol (MIRALAX ) 17 g packet Take 17 g by mouth daily. 30 each 1   prochlorperazine  (COMPAZINE ) 10 MG tablet Take 1 tablet (10 mg total) by  mouth every 6 (six) hours as needed for nausea or vomiting. 30 tablet 0   senna-docusate (SENOKOT-S) 8.6-50 MG tablet Take 2 tablets by mouth 2 (two) times daily. 90 tablet 1   No current facility-administered medications for this visit.   PHYSICAL EXAM: There were no vitals filed for this visit. GENERAL: NAD Lungs- *** CARDIAC:  JVP: *** cm          Normal rate with regular rhythm. *** murmur.  Pulses ***. *** edema.  ABDOMEN: Soft,  non-tender, non-distended.  EXTREMITIES: Warm and well perfused.  NEUROLOGIC: No obvious FND  DATA REVIEW  ECG:NSR on 06/19/22  ECHO: 05/08/22: LVEF 45%-50%, GLS -10.8%. normal RV function.  01/29/22: LVEF 55%-60%, GI DD 05/13/19: LVEF 60-65%  CATH: 12/16/18: Prox LAD lesion is 99% stenosed with 99% stenosed side branch in Ost 1st Diag. A drug-eluting stent was successfully placed using a STENT SYNERGY DES 2.75X20. Postdilated to 3.1 mm Post intervention, there is a 0% residual stenosis. Post intervention, the side branch was reduced to 90% residual stenosis with downstream 80% stenosis. Plan is to treat this medically. ------------------------------------------------------ Prox RCA lesion is 60% stenosed. Mid RCA lesion is 35% stenosed. Mid RCA to Dist RCA lesion is 55% stenosed. RPDA lesion is 65% stenosed. Ost Ramus to Ramus lesion is 45% stenosed. The left ventricular systolic function is normal. The left ventricular ejection fraction is 50-55% by visual estimate. -Apical hypokinesis ==============================   SUMMARY Severe CAD with severe 99% proximal-mid LAD at takeoff of 1st Diag & Septal branches associated with severe 99% ostial stenosis in both branches (1st Diag is a relatively small caliber vessel and diffusely diseased), diffuse moderate to severe 55 to 65% stenoses in proximal and mid RCA as well as RPDA. Successful DES PCI of LAD lesion with synergy DES 2.7 mm x 20 mm (3.1 mm) preserving diagonal and septal flow. 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 Will discuss possibility of  holding herceptin  for 1-2 cycles w/ oncologist.  NYHA class / AHA Stage:II Volume status & Diuretics: euvolemic Vasodilators:stop amlodipine , start losartan  12.5mg   Beta-Blocker:toprol  xl 25 MRA: start spiro 12.5mg  daily Cardiometabolic:will start on follow up Devices therapies & Valvulopathies:not currently indicated Advanced therapies:not indicated  2. CAD - S/P PCI to the LAD in 2020; LHC at the time w/ mutivessel CAD involving the RCA & ramus.  - continue repatha , asa 81mg  daily  3. Stage IV metastatic uterine cancer - History detailed above - Dx in 4/22 at [facility] now s/p total hysterectomy and bilateral salpingo-oophorectomy with sentinal lymph node dissection and partial omentectomy. She received one cycle of taxol /carbo/herceptin  on 03/23/21 but was lost to follow up afterwards. Diagnosed w/ metastatic disease on CT scan at [facility] in June 2023.  - Plan for q21d x 6cycles of carboplatin +paclitaxel +trastuzuma  Krista [name] Advanced Heart Failure Mechanical Circulatory Support

## 2024-01-22 NOTE — Telephone Encounter (Signed)
 Called to confirm/remind patient of their appointment at the [facility] on 01/22/2024.   Appointment:   [x] Confirmed  [] Left mess   [] No answer/No voice mail  [] VM Full/unable to leave message  [] Phone not in service  Patient reminded to bring all medications and/or complete list.  Confirmed patient has transportation. Gave directions, instructed to utilize valet parking.

## 2024-01-23 ENCOUNTER — Encounter (HOSPITAL_COMMUNITY): Payer: Self-pay | Admitting: Cardiology

## 2024-01-23 ENCOUNTER — Ambulatory Visit (HOSPITAL_COMMUNITY)
Admission: RE | Admit: 2024-01-23 | Discharge: 2024-01-23 | Disposition: A | Discharge status: Home / Self Care | Source: Ambulatory Visit | Attending: Cardiology | Admitting: Cardiology

## 2024-01-23 ENCOUNTER — Encounter: Payer: Self-pay | Admitting: Hematology and Oncology

## 2024-01-23 ENCOUNTER — Other Ambulatory Visit (HOSPITAL_COMMUNITY): Payer: Self-pay

## 2024-01-23 VITALS — BP 162/100 | HR 71 | Ht 63.0 in | Wt 160.0 lb

## 2024-01-23 DIAGNOSIS — Z7982 Long term (current) use of aspirin: Secondary | ICD-10-CM | POA: Diagnosis not present

## 2024-01-23 DIAGNOSIS — C7989 Secondary malignant neoplasm of other specified sites: Secondary | ICD-10-CM | POA: Diagnosis not present

## 2024-01-23 DIAGNOSIS — E119 Type 2 diabetes mellitus without complications: Secondary | ICD-10-CM | POA: Insufficient documentation

## 2024-01-23 DIAGNOSIS — I5032 Chronic diastolic (congestive) heart failure: Secondary | ICD-10-CM | POA: Diagnosis not present

## 2024-01-23 DIAGNOSIS — Z8542 Personal history of malignant neoplasm of other parts of uterus: Secondary | ICD-10-CM | POA: Insufficient documentation

## 2024-01-23 DIAGNOSIS — E876 Hypokalemia: Secondary | ICD-10-CM

## 2024-01-23 DIAGNOSIS — Z955 Presence of coronary angioplasty implant and graft: Secondary | ICD-10-CM | POA: Diagnosis not present

## 2024-01-23 DIAGNOSIS — R918 Other nonspecific abnormal finding of lung field: Secondary | ICD-10-CM | POA: Diagnosis not present

## 2024-01-23 DIAGNOSIS — I5020 Unspecified systolic (congestive) heart failure: Secondary | ICD-10-CM | POA: Diagnosis not present

## 2024-01-23 DIAGNOSIS — C541 Malignant neoplasm of endometrium: Secondary | ICD-10-CM

## 2024-01-23 DIAGNOSIS — Z79620 Long term (current) use of immunosuppressive biologic: Secondary | ICD-10-CM | POA: Diagnosis not present

## 2024-01-23 DIAGNOSIS — I2510 Atherosclerotic heart disease of native coronary artery without angina pectoris: Secondary | ICD-10-CM | POA: Diagnosis not present

## 2024-01-23 DIAGNOSIS — I10 Essential (primary) hypertension: Secondary | ICD-10-CM

## 2024-01-23 DIAGNOSIS — I110 Hypertensive heart disease with heart failure: Secondary | ICD-10-CM | POA: Diagnosis present

## 2024-01-23 DIAGNOSIS — N183 Chronic kidney disease, stage 3 unspecified: Secondary | ICD-10-CM

## 2024-01-23 LAB — BASIC METABOLIC PANEL WITH GFR
Anion gap: 10 (ref 5–15)
BUN: 34 mg/dL — ABNORMAL HIGH (ref 6–20)
CO2: 28 mmol/L (ref 22–32)
Calcium: 9.4 mg/dL (ref 8.9–10.3)
Chloride: 103 mmol/L (ref 98–111)
Creatinine, Ser: 1.37 mg/dL — ABNORMAL HIGH (ref 0.44–1.00)
GFR, Estimated: 44 mL/min — ABNORMAL LOW (ref >60)
Glucose, Bld: 89 mg/dL (ref 70–99)
Potassium: 4.2 mmol/L (ref 3.5–5.1)
Sodium: 141 mmol/L (ref 135–145)

## 2024-01-23 LAB — BRAIN NATRIURETIC PEPTIDE: B Natriuretic Peptide: 112.4 pg/mL — ABNORMAL HIGH (ref 0.0–100.0)

## 2024-01-23 MED ORDER — DAPAGLIFLOZIN PROPANEDIOL 10 MG PO TABS: 10.0000 mg | ORAL_TABLET | Freq: Every day | ORAL | 3 refills | Status: AC

## 2024-01-23 NOTE — Patient Instructions (Addendum)
 Medication Changes:  START FARXGIA 10MG  ONCE DAILY   Lab Work:  Labs done today, your results will be available in MyChart, we will contact you for abnormal readings.  ECHOCARDIOGRAM AS SCHEDULED   Follow-Up in: 4-6 WEEKS AS SCHEDULED (RIGHT AFTER ECHO)   At the [facility], you and your health needs are our priority. We have a designated team specialized in the treatment of Heart Failure. This Care Team includes your primary Heart Failure Specialized Cardiologist (physician), Advanced Practice Providers (APPs- Physician Assistants and Nurse Practitioners), and Pharmacist who all work together to provide you with the care you need, when you need it.   You may see any of the following providers on your designated Care Team at your next follow up:  Dr. Jules Oar Dr. Peder Bourdon Dr. Alwin Baars Dr. Judyth Nunnery Nieves Bars, NP Ruddy Corral, [place] [facility] [place], [place] Dennise Fitz, NP [place] Lee, NP Luster Salters, PharmD   Please be sure to bring in all your medications bottles to every appointment.   Need to Contact Us :  If you have any questions or concerns before your next appointment please send us  a message through [place] or call our office at [phone].    TO LEAVE A MESSAGE FOR THE NURSE SELECT OPTION 2, PLEASE LEAVE A MESSAGE INCLUDING: YOUR NAME DATE OF BIRTH CALL BACK NUMBER REASON FOR CALL**this is important as we prioritize the call backs  YOU WILL RECEIVE A CALL BACK THE SAME DAY AS LONG AS YOU CALL BEFORE 4:00 PM

## 2024-01-24 NOTE — Addendum Note (Signed)
 Encounter addended by: Alwin Baars, DO on: 01/24/2024 10:45 AM  Actions taken: Clinical Note Signed

## 2024-01-24 NOTE — Addendum Note (Signed)
 Encounter addended by: Ladainian Therien B, RN on: 01/24/2024 10:47 AM  Actions taken: Order list changed, Diagnosis association updated

## 2024-02-05 ENCOUNTER — Ambulatory Visit (HOSPITAL_COMMUNITY)
Admission: RE | Admit: 2024-02-05 | Discharge: 2024-02-05 | Disposition: A | Discharge status: Home / Self Care | Source: Ambulatory Visit | Attending: Cardiology | Admitting: Cardiology

## 2024-02-05 DIAGNOSIS — I5040 Unspecified combined systolic (congestive) and diastolic (congestive) heart failure: Secondary | ICD-10-CM | POA: Insufficient documentation

## 2024-02-05 DIAGNOSIS — E785 Hyperlipidemia, unspecified: Secondary | ICD-10-CM | POA: Diagnosis not present

## 2024-02-05 DIAGNOSIS — I110 Hypertensive heart disease with heart failure: Secondary | ICD-10-CM | POA: Insufficient documentation

## 2024-02-05 DIAGNOSIS — E119 Type 2 diabetes mellitus without complications: Secondary | ICD-10-CM | POA: Diagnosis not present

## 2024-02-05 DIAGNOSIS — R0602 Shortness of breath: Secondary | ICD-10-CM | POA: Diagnosis not present

## 2024-02-05 DIAGNOSIS — I5020 Unspecified systolic (congestive) heart failure: Secondary | ICD-10-CM

## 2024-02-05 DIAGNOSIS — I080 Rheumatic disorders of both mitral and aortic valves: Secondary | ICD-10-CM | POA: Diagnosis not present

## 2024-02-05 DIAGNOSIS — I371 Nonrheumatic pulmonary valve insufficiency: Secondary | ICD-10-CM | POA: Insufficient documentation

## 2024-02-05 DIAGNOSIS — I429 Cardiomyopathy, unspecified: Secondary | ICD-10-CM | POA: Diagnosis not present

## 2024-02-05 DIAGNOSIS — R55 Syncope and collapse: Secondary | ICD-10-CM | POA: Diagnosis not present

## 2024-02-05 DIAGNOSIS — F17210 Nicotine dependence, cigarettes, uncomplicated: Secondary | ICD-10-CM | POA: Insufficient documentation

## 2024-02-05 DIAGNOSIS — R0600 Dyspnea, unspecified: Secondary | ICD-10-CM | POA: Diagnosis not present

## 2024-02-05 DIAGNOSIS — Z859 Personal history of malignant neoplasm, unspecified: Secondary | ICD-10-CM | POA: Diagnosis not present

## 2024-02-05 LAB — ECHOCARDIOGRAM COMPLETE
Area-P 1/2: 2.91 cm2
Calc EF: 67.7 %
S' Lateral: 2.3 cm
Single Plane A2C EF: 68.4 %
Single Plane A4C EF: 67.6 %

## 2024-02-05 NOTE — Progress Notes (Signed)
  Echocardiogram 2D Echocardiogram has been performed.  Devora Ellouise SAUNDERS 02/05/2024, 11:46 AM

## 2024-02-14 ENCOUNTER — Encounter: Payer: Self-pay | Admitting: Hematology and Oncology

## 2024-02-14 ENCOUNTER — Inpatient Hospital Stay: Attending: Hematology and Oncology

## 2024-02-14 ENCOUNTER — Inpatient Hospital Stay

## 2024-02-14 ENCOUNTER — Inpatient Hospital Stay (HOSPITAL_BASED_OUTPATIENT_CLINIC_OR_DEPARTMENT_OTHER): Admitting: Hematology and Oncology

## 2024-02-14 VITALS — BP 132/83 | HR 67 | Temp 98.2°F | Resp 19 | Wt 162.8 lb

## 2024-02-14 DIAGNOSIS — I2510 Atherosclerotic heart disease of native coronary artery without angina pectoris: Secondary | ICD-10-CM | POA: Diagnosis not present

## 2024-02-14 DIAGNOSIS — N183 Chronic kidney disease, stage 3 unspecified: Secondary | ICD-10-CM

## 2024-02-14 DIAGNOSIS — C7802 Secondary malignant neoplasm of left lung: Secondary | ICD-10-CM | POA: Insufficient documentation

## 2024-02-14 DIAGNOSIS — Z87891 Personal history of nicotine dependence: Secondary | ICD-10-CM | POA: Insufficient documentation

## 2024-02-14 DIAGNOSIS — Z5112 Encounter for antineoplastic immunotherapy: Secondary | ICD-10-CM | POA: Diagnosis present

## 2024-02-14 DIAGNOSIS — C541 Malignant neoplasm of endometrium: Secondary | ICD-10-CM | POA: Diagnosis present

## 2024-02-14 DIAGNOSIS — Z79620 Long term (current) use of immunosuppressive biologic: Secondary | ICD-10-CM | POA: Insufficient documentation

## 2024-02-14 DIAGNOSIS — C7801 Secondary malignant neoplasm of right lung: Secondary | ICD-10-CM | POA: Insufficient documentation

## 2024-02-14 DIAGNOSIS — G4700 Insomnia, unspecified: Secondary | ICD-10-CM | POA: Insufficient documentation

## 2024-02-14 DIAGNOSIS — G4709 Other insomnia: Secondary | ICD-10-CM

## 2024-02-14 LAB — CBC WITH DIFFERENTIAL (CANCER CENTER ONLY)
Abs Immature Granulocytes: 0.01 10*3/uL (ref 0.00–0.07)
Basophils Absolute: 0 10*3/uL (ref 0.0–0.1)
Basophils Relative: 0 %
Eosinophils Absolute: 0.1 10*3/uL (ref 0.0–0.5)
Eosinophils Relative: 2 %
HCT: 39.7 % (ref 36.0–46.0)
Hemoglobin: 13.4 g/dL (ref 12.0–15.0)
Immature Granulocytes: 0 %
Lymphocytes Relative: 28 %
Lymphs Abs: 2.3 10*3/uL (ref 0.7–4.0)
MCH: 28.6 pg (ref 26.0–34.0)
MCHC: 33.8 g/dL (ref 30.0–36.0)
MCV: 84.6 fL (ref 80.0–100.0)
Monocytes Absolute: 0.9 10*3/uL (ref 0.1–1.0)
Monocytes Relative: 10 %
Neutro Abs: 5 10*3/uL (ref 1.7–7.7)
Neutrophils Relative %: 60 %
Platelet Count: 226 10*3/uL (ref 150–400)
RBC: 4.69 MIL/uL (ref 3.87–5.11)
RDW: 15.9 % — ABNORMAL HIGH (ref 11.5–15.5)
WBC Count: 8.4 10*3/uL (ref 4.0–10.5)
nRBC: 0 % (ref 0.0–0.2)

## 2024-02-14 LAB — CMP (CANCER CENTER ONLY)
ALT: 17 U/L (ref 0–44)
AST: 23 U/L (ref 15–41)
Albumin: 4.1 g/dL (ref 3.5–5.0)
Alkaline Phosphatase: 45 U/L (ref 38–126)
Anion gap: 10 (ref 5–15)
BUN: 53 mg/dL — ABNORMAL HIGH (ref 6–20)
CO2: 28 mmol/L (ref 22–32)
Calcium: 9.4 mg/dL (ref 8.9–10.3)
Chloride: 100 mmol/L (ref 98–111)
Creatinine: 2.01 mg/dL — ABNORMAL HIGH (ref 0.44–1.00)
GFR, Estimated: 28 mL/min — ABNORMAL LOW (ref >60)
Glucose, Bld: 200 mg/dL — ABNORMAL HIGH (ref 70–99)
Potassium: 3.5 mmol/L (ref 3.5–5.1)
Sodium: 138 mmol/L (ref 135–145)
Total Bilirubin: 0.8 mg/dL (ref 0.0–1.2)
Total Protein: 7.6 g/dL (ref 6.5–8.1)

## 2024-02-14 LAB — TSH: TSH: 0.893 u[IU]/mL (ref 0.350–4.500)

## 2024-02-14 MED ORDER — SODIUM CHLORIDE 0.9% FLUSH
10.0000 mL | Freq: Once | INTRAVENOUS | Status: AC
  Administered 2024-02-14: 10 mL

## 2024-02-14 MED ORDER — SODIUM CHLORIDE 0.9 % IV SOLN: Freq: Once | INTRAVENOUS | Status: AC

## 2024-02-14 MED ORDER — METOPROLOL SUCCINATE ER 100 MG PO TB24
100.0000 mg | ORAL_TABLET | Freq: Every day | ORAL | 3 refills | Status: AC
Start: 2024-02-14 — End: ?

## 2024-02-14 MED ORDER — SODIUM CHLORIDE 0.9 % IV SOLN
1000.0000 mg | Freq: Once | INTRAVENOUS | Status: AC
  Administered 2024-02-14: 1000 mg via INTRAVENOUS
  Filled 2024-02-14: qty 20

## 2024-02-14 MED ORDER — SODIUM CHLORIDE 0.9% FLUSH
10.0000 mL | INTRAVENOUS | Status: DC | PRN
  Administered 2024-02-14: 10 mL

## 2024-02-14 MED ORDER — HEPARIN SOD (PORK) LOCK FLUSH 100 UNIT/ML IV SOLN
500.0000 [IU] | Freq: Once | INTRAVENOUS | Status: AC | PRN
  Administered 2024-02-14: 500 [IU]

## 2024-02-14 MED ORDER — TRAZODONE HCL 50 MG PO TABS: 50.0000 mg | ORAL_TABLET | Freq: Every day | ORAL | 1 refills | Status: DC

## 2024-02-14 NOTE — Assessment & Plan Note (Addendum)
 I recommend trial of trazodone

## 2024-02-14 NOTE — Assessment & Plan Note (Addendum)
 She has quit smoking I plan to repeat imaging study next month due to recent findings of lung nodule

## 2024-02-14 NOTE — Assessment & Plan Note (Addendum)
 She has intermittent acute on chronic renal failure We discussed importance of hydration and risk factor modification Her renal function is greater than 2, CT will be done without contrast Patient is advised to follow with PCP and agrees with more oral liquid

## 2024-02-14 NOTE — Patient Instructions (Signed)
 [facility]  Discharge Instructions: Thank you for choosing [facility] to provide your oncology and hematology care.   If you have a lab appointment with the Cancer Center, please go directly to the Cancer Center and check in at the registration area.   Wear comfortable clothing and clothing appropriate for easy access to any Portacath or PICC line.   We strive to give you quality time with your provider. You may need to reschedule your appointment if you arrive late (15 or more minutes).  Arriving late affects you and other patients whose appointments are after yours.  Also, if you miss three or more appointments without notifying the office, you may be dismissed from the clinic at the provider's discretion.      For prescription refill requests, have your pharmacy contact our office and allow 72 hours for refills to be completed.    Today you received the following chemotherapy and/or immunotherapy agents: Jemperli      To help prevent nausea and vomiting after your treatment, we encourage you to take your nausea medication as directed.  BELOW ARE SYMPTOMS THAT SHOULD BE REPORTED IMMEDIATELY: *FEVER GREATER THAN 100.4 F (38 C) OR HIGHER *CHILLS OR SWEATING *NAUSEA AND VOMITING THAT IS NOT CONTROLLED WITH YOUR NAUSEA MEDICATION *UNUSUAL SHORTNESS OF BREATH *UNUSUAL BRUISING OR BLEEDING *URINARY PROBLEMS (pain or burning when urinating, or frequent urination) *BOWEL PROBLEMS (unusual diarrhea, constipation, pain near the anus) TENDERNESS IN MOUTH AND THROAT WITH OR WITHOUT PRESENCE OF ULCERS (sore throat, sores in mouth, or a toothache) UNUSUAL RASH, SWELLING OR PAIN  UNUSUAL VAGINAL DISCHARGE OR ITCHING   Items with * indicate a potential emergency and should be followed up as soon as possible or go to the Emergency Department if any problems should occur.  Please show the CHEMOTHERAPY ALERT CARD or IMMUNOTHERAPY  ALERT CARD at check-in to the Emergency Department and triage nurse.  Should you have questions after your visit or need to cancel or reschedule your appointment, please contact [facility]  Dept: [phone]  and follow the prompts.  Office hours are 8:00 a.m. to 4:30 p.m. Monday - Friday. Please note that voicemails left after 4:00 p.m. may not be returned until the following business day.  We are closed weekends and major holidays. You have access to a nurse at all times for urgent questions. Please call the main number to the clinic Dept: [phone] and follow the prompts.   For any non-urgent questions, you may also contact your provider using MyChart. We now offer e-Visits for anyone 35 and older to request care online for non-urgent symptoms. For details visit [email or web address].   Also download the MyChart app! Go to the app store, search "MyChart", open the app, select [place], and log in with your MyChart username and password.

## 2024-02-14 NOTE — Addendum Note (Signed)
 Addended byBETHA BEDFORD, Bohdi Leeds on: 02/14/2024 01:15 PM   Modules accepted: Orders, Level of Service

## 2024-02-14 NOTE — Assessment & Plan Note (Addendum)
 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

## 2024-02-14 NOTE — Assessment & Plan Note (Addendum)
 She have history of cardiomyopathy Recent repeat echocardiogram showed improved ejection fraction She will continue medical management

## 2024-02-14 NOTE — Progress Notes (Addendum)
 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, High grade serous MSI stable, Her2/neu 3+, Er/PR neg  Her last imaging in November 2024 showed complete response to treatment with no signs of cancer recurrence  CT imaging from April showed nonspecific lung nodule but she is not symptomatic  we will continue treatment without delay  I plan to repeat imaging study next month Malignant neoplasm metastatic to both lungs [facility]) She has quit smoking I plan to repeat imaging study next month due to recent findings of lung nodule Coronary artery disease involving native coronary artery of native heart without angina pectoris She have history of cardiomyopathy Recent repeat echocardiogram showed improved ejection fraction She will continue medical management Stage 3 chronic kidney disease, unspecified whether stage 3a or 3b CKD ([name]) She has intermittent acute on chronic renal failure We discussed importance of hydration and risk factor modification Her renal function is greater than 2, CT will be done without contrast Patient is advised to follow with PCP and agrees with more oral liquid Other insomnia I recommend trial of trazodone  Orders Placed This Encounter  Procedures   CT CHEST WO CONTRAST    Standing Status:   Future    Expected Date:   03/20/2024    Expiration Date:   02/13/2025    Preferred imaging location?:   [facility]    Radiology Contrast Protocol - do NOT remove file path:   [email or web address]    Is patient pregnant?:   No   CT ABDOMEN PELVIS WO CONTRAST    Standing Status:   Future    Expected Date:   03/20/2024     Expiration Date:   02/13/2025    Is patient pregnant?:   No    Preferred imaging location?:   [facility]    If indicated for the ordered procedure, I authorize the administration of oral contrast media per Radiology protocol:   No    Reason for no oral contrast::   no need oral contrast   CBC with Differential (Cancer Center Only)    Standing Status:   Future    Expected Date:   05/08/2024    Expiration Date:   05/08/2025   CMP (Cancer Center only)    Standing Status:   Future    Expected Date:   05/08/2024    Expiration Date:   05/08/2025   T4    Standing Status:   Future    Expected Date:   05/08/2024    Expiration Date:   05/08/2025   TSH    Standing Status:   Future    Expected Date:   05/08/2024    Expiration Date:   05/08/2025     Hicks Lonn, MD  INTERVAL HISTORY: she returns for treatment follow-up Complications related to previous cycle of chemotherapy included fatigue, and insomnia She was recently evaluated with repeat echocardiogram to follow-up on history of cardiomyopathy She is not symptomatic She has no signs or symptoms of cancer recurrence Her blood sugar and blood pressure monitoring at home were satisfactory She requested medication to treat her insomnia  PHYSICAL EXAMINATION: ECOG PERFORMANCE STATUS: 0 - Asymptomatic  Lab Results  Component Value Date   CAN125  145.0 (H) 01/29/2022      Latest Ref Rng & Units 02/14/2024   12:02 PM 01/03/2024   11:38 AM 11/22/2023   11:28 AM  CBC  WBC 4.0 - 10.5 K/uL 8.4  6.1  5.3   Hemoglobin 12.0 - 15.0 g/dL 86.5  87.7  88.6   Hematocrit 36.0 - 46.0 % 39.7  36.7  33.5   Platelets 150 - 400 K/uL 226  214  224       Chemistry      Component Value Date/Time   NA 138 02/14/2024 1202   NA 141 02/25/2019 1222   K 3.5 02/14/2024 1202   CL 100 02/14/2024 1202   CO2 28 02/14/2024 1202   BUN 53 (H) 02/14/2024 1202   BUN 17 02/25/2019 1222   CREATININE 2.01 (H) 02/14/2024 1202      Component Value Date/Time    CALCIUM  9.4 02/14/2024 1202   ALKPHOS 45 02/14/2024 1202   AST 23 02/14/2024 1202   ALT 17 02/14/2024 1202   BILITOT 0.8 02/14/2024 1202       Vitals:   02/14/24 1232  BP: 132/83  Pulse: 67  Resp: 19  Temp: 98.2 F (36.8 C)  SpO2: 99%   Filed Weights   02/14/24 1232  Weight: 162 lb 12.8 oz (73.8 kg)   Other relevant data reviewed during this visit included CBC, CMP, echocardiogram

## 2024-02-15 LAB — T4: T4, Total: 8 ug/dL (ref 4.5–12.0)

## 2024-02-18 NOTE — Progress Notes (Signed)
 [facility]ANCED HE[facility] FAILURE CLINIC NOTE  Referring Physician: Lang Dedra DASEN, MD  Primary Care: Lang Dedra DASEN, MD Primary Cardiologist: Dr. Court Oncologist: Almarie Bedford MD  CC: [id number]  HPI: Krista [name] is a 63 y.o. female with recently diagnosed heart failure with reduced ejection fraction, endometrial cancer, hypertension, type 2 diabetes and history of CAD status post PCI to the mid LAD in May 2020 presenting today to establish care.  Krista [name] was originally diagnosed with endometrial cancer  in 11/2020 via endometrial biopsy at [facility]. She underwent total hysterectomy and bilateral salpingo-oophorectomy with sentinal lymph node dissection and partial omentectomy. She received one cycle of taxol /carbo/herceptin  on 03/23/21 but was lost to follow up afterwards. In June 2023, she presented to [facility] for left sided chest pain after a fall. During that admission contrast CT showed multiple abdominal and pelvic mets from stage IV endometrial cancer.  Since that time she has followed with Dr. Bedford and is currently undergoing treatment with carboplatin +paclitaxel +trastuzumab  every 21 days for 6 cycles. In September 2023 TTE demonstrated a new reduction in LVEF and she was subsequently referred to cardio-oncology for further evaluation.   -Reports that she is still undergoing treatment with Jemperli  every 6 weeks. Her next infusion is on July 3rd.  - From a heart failure standpoint she has been doing very well.  She has no lower extremity edema, reports minimal shortness of breath, PND or orthopnea.  Unfortunately she did not get her echocardiogram that was previously scheduled.   Current Outpatient Medications  Medication Sig Dispense Refill   acetaminophen  (TYLENOL ) 500 MG tablet Take 1,000 mg by mouth every 6 (six) hours as needed for mild pain or headache.     aspirin  EC 81 MG tablet Take 1 tablet (81 mg total) by mouth daily. Swallow whole.     dapagliflozin  propanediol (FARXIGA ) 10  MG TABS tablet Take 1 tablet (10 mg total) by mouth daily before breakfast. 90 tablet 3   doxazosin  (CARDURA ) 4 MG tablet Take 1 tablet (4 mg total) by mouth daily. 30 tablet 3   Evolocumab  (REPATHA  SURECLICK) 140 MG/ML SOAJ Inject 140 mg into the skin every 14 (fourteen) days. 2 pen 11   hydrochlorothiazide  (HYDRODIURIL ) 25 MG tablet Take 1 tablet (25 mg total) by mouth daily. 30 tablet 3   lidocaine -prilocaine  (EMLA ) cream Apply 1 application topically as needed. 30 g 0   loratadine  (CLARITIN ) 10 MG tablet Take 1 tablet (10 mg total) by mouth daily as needed for allergies. 30 tablet 11   losartan  (COZAAR ) 100 MG tablet Take 1 tablet (100 mg total) by mouth daily. 90 tablet 0   metFORMIN  (GLUCOPHAGE ) 1000 MG tablet Take 1,000 mg by mouth daily.     metoprolol  succinate (TOPROL  XL) 100 MG 24 hr tablet Take 1 tablet (100 mg total) by mouth daily. 30 tablet 3   nitroGLYCERIN  (NITROSTAT ) 0.4 MG SL tablet Place 1 tablet (0.4 mg total) under the tongue every 5 (five) minutes as needed for chest pain. 30 tablet 2   ondansetron  (ZOFRAN ) 8 MG tablet Take 1 tablet (8 mg total) by mouth every 8 (eight) hours as needed for nausea. 30 tablet 3   polyethylene glycol (MIRALAX ) 17 g packet Take 17 g by mouth daily. 30 each 1   prochlorperazine  (COMPAZINE ) 10 MG tablet Take 1 tablet (10 mg total) by mouth every 6 (six) hours as needed for nausea or vomiting. 30 tablet 0   senna-docusate (SENOKOT-S) 8.6-50 MG tablet Take 2 tablets by  mouth 2 (two) times daily. 90 tablet 1   traZODone  (DESYREL ) 50 MG tablet Take 1 tablet (50 mg total) by mouth at bedtime. 30 tablet 1   No current facility-administered medications for this visit.   PHYSICAL EXAM: There were no vitals filed for this visit. GENERAL: NAD Lungs- *** CARDIAC:  JVP: *** cm          Normal rate with regular rhythm. *** murmur.  Pulses ***. *** edema.  ABDOMEN: Soft, non-tender, non-distended.  EXTREMITIES: Warm and well perfused.  NEUROLOGIC: No  obvious FND '  DATA REVIEW  ECG:NSR on 06/19/22  ECHO: 05/08/22: LVEF 45%-50%, GLS -10.8%. normal RV function.  01/29/22: LVEF 55%-60%, GI DD 05/13/19: LVEF 60-65%  CATH: 12/16/18: Prox LAD lesion is 99% stenosed with 99% stenosed side branch in Ost 1st Diag. A drug-eluting stent was successfully placed using a STENT SYNERGY DES 2.75X20. Postdilated to 3.1 mm Post intervention, there is a 0% residual stenosis. Post intervention, the side branch was reduced to 90% residual stenosis with downstream 80% stenosis. Plan is to treat this medically. ------------------------------------------------------ Prox RCA lesion is 60% stenosed. Mid RCA lesion is 35% stenosed. Mid RCA to Dist RCA lesion is 55% stenosed. RPDA lesion is 65% stenosed. Ost Ramus to Ramus lesion is 45% stenosed. The left ventricular systolic function is normal. The left ventricular ejection fraction is 50-55% by visual estimate. -Apical hypokinesis ==============================   SUMMARY Severe CAD with severe 99% proximal-mid LAD at takeoff of 1st Diag & Septal branches associated with severe 99% ostial stenosis in both branches (1st Diag is a relatively small caliber vessel and diffusely diseased), diffuse moderate to severe 55 to 65% stenoses in proximal and mid RCA as well as RPDA. Successful DES PCI of LAD lesion with synergy DES 2.7 mm x 20 mm (3.1 mm) preserving diagonal and septal flow. 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  Currently undergoing chemotherapy with immune checkpoint inhibitor.  Discussed importance of stat echo.  Will  obtain ASAP. NYHA class / AHA Stage:II Volume status & Diuretics: euvolemic Vasodilators:continue losartan  100mg  dialy.  Beta-Blocker:toprol  xl 100mg  MRA: start spironolactone  25 mg daily.  Cardiometabolic:start farxiga  10mg  daily Devices therapies & Valvulopathies:not currently indicated Advanced therapies:not indicated  2. CAD - S/P PCI to the LAD in 2020; LHC at the time w/ mutivessel CAD involving the RCA & ramus.  - continue repatha , asa 81mg  daily  3. Stage IV metastatic uterine cancer - History detailed above - Dx in 4/22 at [facility] now s/p total hysterectomy and bilateral salpingo-oophorectomy with sentinal lymph node dissection and partial omentectomy. She received one cycle of taxol /carbo/herceptin  on 03/23/21 but was lost to follow up afterwards. Diagnosed w/ metastatic disease on CT scan at [facility] in June 2023.  - She is now undergoing treatment with immune checkpoint inhibitor every 6 weeks.  Reports that she is tolerating it very well.  Unfortunately did not get her previously scheduled echo.  Will try to schedule ASAP. - Reviewed CT scan from 11/24/2023, stable with new small 0.7 m spiculated nodule in the right lung.   Rande Dario Advanced Heart Failure Mechanical Circulatory Support

## 2024-02-19 ENCOUNTER — Telehealth (HOSPITAL_COMMUNITY): Payer: Self-pay | Admitting: Cardiology

## 2024-02-19 NOTE — Telephone Encounter (Signed)
 Called to confirm/remind patient of their appointment at the [facility] on 02/19/2024.   Appointment:   [] Confirmed  [x] Left mess   [] No answer/No voice mail  [] VM Full/unable to leave message  [] Phone not in service  Patient reminded to bring all medications and/or complete list.  Confirmed patient has transportation. Gave directions, instructed to utilize valet parking.

## 2024-02-19 NOTE — Telephone Encounter (Signed)
 Called to confirm/remind patient of their appointment at the [facility] on 02/19/24.   Appointment:   [x] Confirmed  [] Left mess   [] No answer/No voice mail  [] VM Full/unable to leave message  [] Phone not in service  Patient reminded to bring all medications and/or complete list.  Confirmed patient has transportation. Gave directions, instructed to utilize valet parking.

## 2024-02-20 ENCOUNTER — Ambulatory Visit (HOSPITAL_COMMUNITY)
Admission: RE | Admit: 2024-02-20 | Discharge: 2024-02-20 | Disposition: A | Discharge status: Home / Self Care | Source: Ambulatory Visit | Attending: Cardiology | Admitting: Cardiology

## 2024-02-20 ENCOUNTER — Other Ambulatory Visit (HOSPITAL_COMMUNITY): Payer: Self-pay

## 2024-02-20 ENCOUNTER — Encounter (HOSPITAL_COMMUNITY): Payer: Self-pay | Admitting: Cardiology

## 2024-02-20 ENCOUNTER — Telehealth: Payer: Self-pay | Admitting: Pharmacist

## 2024-02-20 VITALS — BP 122/62 | HR 75 | Ht 63.0 in | Wt 168.6 lb

## 2024-02-20 DIAGNOSIS — N183 Chronic kidney disease, stage 3 unspecified: Secondary | ICD-10-CM

## 2024-02-20 DIAGNOSIS — Z85118 Personal history of other malignant neoplasm of bronchus and lung: Secondary | ICD-10-CM | POA: Insufficient documentation

## 2024-02-20 DIAGNOSIS — I130 Hypertensive heart and chronic kidney disease with heart failure and stage 1 through stage 4 chronic kidney disease, or unspecified chronic kidney disease: Secondary | ICD-10-CM | POA: Insufficient documentation

## 2024-02-20 DIAGNOSIS — Z7982 Long term (current) use of aspirin: Secondary | ICD-10-CM | POA: Diagnosis not present

## 2024-02-20 DIAGNOSIS — E782 Mixed hyperlipidemia: Secondary | ICD-10-CM

## 2024-02-20 DIAGNOSIS — Z8542 Personal history of malignant neoplasm of other parts of uterus: Secondary | ICD-10-CM | POA: Insufficient documentation

## 2024-02-20 DIAGNOSIS — Z79899 Other long term (current) drug therapy: Secondary | ICD-10-CM | POA: Insufficient documentation

## 2024-02-20 DIAGNOSIS — Z9221 Personal history of antineoplastic chemotherapy: Secondary | ICD-10-CM | POA: Insufficient documentation

## 2024-02-20 DIAGNOSIS — N189 Chronic kidney disease, unspecified: Secondary | ICD-10-CM | POA: Diagnosis not present

## 2024-02-20 DIAGNOSIS — E1122 Type 2 diabetes mellitus with diabetic chronic kidney disease: Secondary | ICD-10-CM | POA: Insufficient documentation

## 2024-02-20 DIAGNOSIS — I2510 Atherosclerotic heart disease of native coronary artery without angina pectoris: Secondary | ICD-10-CM | POA: Insufficient documentation

## 2024-02-20 DIAGNOSIS — Z955 Presence of coronary angioplasty implant and graft: Secondary | ICD-10-CM | POA: Insufficient documentation

## 2024-02-20 DIAGNOSIS — C541 Malignant neoplasm of endometrium: Secondary | ICD-10-CM | POA: Diagnosis not present

## 2024-02-20 DIAGNOSIS — I10 Essential (primary) hypertension: Secondary | ICD-10-CM

## 2024-02-20 DIAGNOSIS — I5032 Chronic diastolic (congestive) heart failure: Secondary | ICD-10-CM

## 2024-02-20 DIAGNOSIS — I5020 Unspecified systolic (congestive) heart failure: Secondary | ICD-10-CM | POA: Insufficient documentation

## 2024-02-20 DIAGNOSIS — Z7984 Long term (current) use of oral hypoglycemic drugs: Secondary | ICD-10-CM | POA: Insufficient documentation

## 2024-02-20 LAB — BASIC METABOLIC PANEL WITH GFR
Anion gap: 11 (ref 5–15)
BUN: 35 mg/dL — ABNORMAL HIGH (ref 6–20)
CO2: 24 mmol/L (ref 22–32)
Calcium: 9.1 mg/dL (ref 8.9–10.3)
Chloride: 103 mmol/L (ref 98–111)
Creatinine, Ser: 1.83 mg/dL — ABNORMAL HIGH (ref 0.44–1.00)
GFR, Estimated: 31 mL/min — ABNORMAL LOW (ref >60)
Glucose, Bld: 129 mg/dL — ABNORMAL HIGH (ref 70–99)
Potassium: 3.9 mmol/L (ref 3.5–5.1)
Sodium: 138 mmol/L (ref 135–145)

## 2024-02-20 LAB — BRAIN NATRIURETIC PEPTIDE: B Natriuretic Peptide: 75.1 pg/mL (ref 0.0–100.0)

## 2024-02-20 MED ORDER — REPATHA SURECLICK 140 MG/ML ~~LOC~~ SOAJ
140.0000 mg | SUBCUTANEOUS | 1 refills | Status: DC
  Filled 2024-02-20 – 2024-03-12 (×2): qty 6, 84d supply, fill #0

## 2024-02-20 NOTE — Patient Instructions (Signed)
 Medication Changes:  No Changes In Medications at this time.   PLEASE REACH OUT TO DR. RANEE OFFICE FOR REFILL ON REPATHA - [phone]- WILL ALSO SEND MESSAGE OVER   Lab Work:  Labs done today, your results will be available in MyChart, we will contact you for abnormal readings.  Follow-Up in: 3 MONTHS WITH AN ECHO AROUND (October) PLEASE CALL OUR OFFICE AROUND AUGUST TO GET SCHEDULED FOR YOUR APPOINTMENT. PHONE NUMBER IS [phone] OPTION 2   At the [facility], you and your health needs are our priority. We have a designated team specialized in the treatment of Heart Failure. This Care Team includes your primary Heart Failure Specialized Cardiologist (physician), Advanced Practice Providers (APPs- Physician Assistants and Nurse Practitioners), and Pharmacist who all work together to provide you with the care you need, when you need it.   You may see any of the following providers on your designated Care Team at your next follow up:  Dr. Toribio Fuel Dr. Ezra Shuck Dr. Ria Commander Dr. Odis Brownie Greig Mosses, NP Caffie Shed, [place] [facility] [place], [place] Beckey Coe, NP [place] Lee, NP Tinnie Redman, PharmD   Please be sure to bring in all your medications bottles to every appointment.   Need to Contact Us :  If you have any questions or concerns before your next appointment please send us  a message through [place] or call our office at [phone].    TO LEAVE A MESSAGE FOR THE NURSE SELECT OPTION 2, PLEASE LEAVE A MESSAGE INCLUDING: YOUR NAME DATE OF BIRTH CALL BACK NUMBER REASON FOR CALL**this is important as we prioritize the call backs  YOU WILL RECEIVE A CALL BACK THE SAME DAY AS LONG AS YOU CALL BEFORE 4:00 PM

## 2024-02-20 NOTE — Telephone Encounter (Signed)
-----   Message from Nurse Andriette B sent at 02/20/2024 12:33 PM EDT ----- Regarding: Repatha  Refill Hi, patient here in clinic today seen by Dr. Gardenia- she reports she needs refill of repatha - can this be sent in?   Thank you  Jenna B.

## 2024-02-21 ENCOUNTER — Other Ambulatory Visit: Payer: Self-pay | Admitting: Hematology and Oncology

## 2024-02-21 ENCOUNTER — Other Ambulatory Visit (HOSPITAL_COMMUNITY): Payer: Self-pay

## 2024-02-22 ENCOUNTER — Encounter: Payer: Self-pay | Admitting: Hematology and Oncology

## 2024-02-22 ENCOUNTER — Telehealth: Payer: Self-pay

## 2024-02-22 NOTE — Telephone Encounter (Signed)
 Patient informed that Dr. Lonn will be refilling her BP medications for one additional time. However, in the future she will no longer be refilling these medications and the patient will need to have her cardiologist or PCP handle future refills. Patient verbalized an understanding of the information and confirmed that she will reach out to her providers to obtain future refills.

## 2024-02-26 ENCOUNTER — Other Ambulatory Visit (HOSPITAL_COMMUNITY): Payer: Self-pay

## 2024-02-29 ENCOUNTER — Other Ambulatory Visit (HOSPITAL_COMMUNITY): Payer: Self-pay

## 2024-03-01 ENCOUNTER — Other Ambulatory Visit (HOSPITAL_COMMUNITY): Payer: Self-pay

## 2024-03-03 ENCOUNTER — Other Ambulatory Visit (HOSPITAL_COMMUNITY): Payer: Self-pay

## 2024-03-04 ENCOUNTER — Other Ambulatory Visit (HOSPITAL_COMMUNITY): Payer: Self-pay

## 2024-03-06 ENCOUNTER — Other Ambulatory Visit (HOSPITAL_COMMUNITY): Payer: Self-pay

## 2024-03-10 ENCOUNTER — Other Ambulatory Visit (HOSPITAL_COMMUNITY): Payer: Self-pay

## 2024-03-11 ENCOUNTER — Other Ambulatory Visit (HOSPITAL_COMMUNITY): Payer: Self-pay

## 2024-03-11 ENCOUNTER — Telehealth (HOSPITAL_COMMUNITY): Payer: Self-pay

## 2024-03-12 ENCOUNTER — Encounter: Payer: Self-pay | Admitting: Hematology and Oncology

## 2024-03-12 ENCOUNTER — Other Ambulatory Visit (HOSPITAL_COMMUNITY): Payer: Self-pay

## 2024-03-12 ENCOUNTER — Other Ambulatory Visit (HOSPITAL_BASED_OUTPATIENT_CLINIC_OR_DEPARTMENT_OTHER): Payer: Self-pay

## 2024-03-12 ENCOUNTER — Telehealth: Payer: Self-pay | Admitting: Pharmacy Technician

## 2024-03-12 ENCOUNTER — Other Ambulatory Visit: Payer: Self-pay

## 2024-03-12 NOTE — Telephone Encounter (Signed)
 Pharmacy Patient Advocate Encounter  Received notification from [facility] that Prior Authorization for REPATHA  140MG  has been APPROVED from 03/12/24 to 09/12/24

## 2024-03-12 NOTE — Telephone Encounter (Signed)
  Received notification from [facility] that Prior Authorization for REPATHA  140MG  has been APPROVED from 03/12/24 to 09/12/24

## 2024-03-12 NOTE — Telephone Encounter (Signed)
 Pharmacy Patient Advocate Encounter   Received notification from Pt Calls Messages-from [facility] pharmacy that prior authorization for repatha  140mg  is required/requested.   Insurance verification completed.   The patient is insured through [facility] .   Per test claim: PA required; PA submitted to above mentioned insurance via latent Key/confirmation #/EOC [id number] Status is pending

## 2024-03-12 NOTE — Telephone Encounter (Signed)
 PA request has been Submitted. New Encounter has been or will be created for follow up. For additional info see Pharmacy Prior Auth telephone encounter from 03/12/24.

## 2024-03-13 ENCOUNTER — Other Ambulatory Visit: Payer: Self-pay

## 2024-03-13 ENCOUNTER — Other Ambulatory Visit (HOSPITAL_BASED_OUTPATIENT_CLINIC_OR_DEPARTMENT_OTHER): Payer: Self-pay

## 2024-03-20 ENCOUNTER — Other Ambulatory Visit: Payer: Self-pay | Admitting: Hematology and Oncology

## 2024-03-20 ENCOUNTER — Ambulatory Visit (HOSPITAL_COMMUNITY)
Admission: RE | Admit: 2024-03-20 | Discharge: 2024-03-20 | Disposition: A | Discharge status: Home / Self Care | Source: Ambulatory Visit | Attending: Hematology and Oncology | Admitting: Hematology and Oncology

## 2024-03-20 DIAGNOSIS — C7801 Secondary malignant neoplasm of right lung: Secondary | ICD-10-CM | POA: Diagnosis present

## 2024-03-20 DIAGNOSIS — I2510 Atherosclerotic heart disease of native coronary artery without angina pectoris: Secondary | ICD-10-CM | POA: Diagnosis present

## 2024-03-20 DIAGNOSIS — C7802 Secondary malignant neoplasm of left lung: Secondary | ICD-10-CM | POA: Insufficient documentation

## 2024-03-20 DIAGNOSIS — C541 Malignant neoplasm of endometrium: Secondary | ICD-10-CM | POA: Insufficient documentation

## 2024-03-20 DIAGNOSIS — N183 Chronic kidney disease, stage 3 unspecified: Secondary | ICD-10-CM | POA: Insufficient documentation

## 2024-03-20 DIAGNOSIS — G4709 Other insomnia: Secondary | ICD-10-CM

## 2024-03-23 ENCOUNTER — Observation Stay (HOSPITAL_COMMUNITY)
Admission: EM | Admit: 2024-03-23 | Discharge: 2024-03-25 | Disposition: A | Discharge status: Home / Self Care | Attending: Internal Medicine | Admitting: Internal Medicine

## 2024-03-23 ENCOUNTER — Encounter (HOSPITAL_COMMUNITY): Payer: Self-pay

## 2024-03-23 ENCOUNTER — Other Ambulatory Visit: Payer: Self-pay

## 2024-03-23 ENCOUNTER — Emergency Department (HOSPITAL_COMMUNITY)

## 2024-03-23 DIAGNOSIS — I110 Hypertensive heart disease with heart failure: Secondary | ICD-10-CM | POA: Insufficient documentation

## 2024-03-23 DIAGNOSIS — Z7982 Long term (current) use of aspirin: Secondary | ICD-10-CM | POA: Diagnosis not present

## 2024-03-23 DIAGNOSIS — Z1152 Encounter for screening for COVID-19: Secondary | ICD-10-CM | POA: Insufficient documentation

## 2024-03-23 DIAGNOSIS — J441 Chronic obstructive pulmonary disease with (acute) exacerbation: Secondary | ICD-10-CM | POA: Diagnosis not present

## 2024-03-23 DIAGNOSIS — J9621 Acute and chronic respiratory failure with hypoxia: Principal | ICD-10-CM | POA: Insufficient documentation

## 2024-03-23 DIAGNOSIS — I2510 Atherosclerotic heart disease of native coronary artery without angina pectoris: Secondary | ICD-10-CM | POA: Diagnosis not present

## 2024-03-23 DIAGNOSIS — R0602 Shortness of breath: Secondary | ICD-10-CM | POA: Diagnosis present

## 2024-03-23 DIAGNOSIS — I5032 Chronic diastolic (congestive) heart failure: Secondary | ICD-10-CM | POA: Diagnosis not present

## 2024-03-23 DIAGNOSIS — E119 Type 2 diabetes mellitus without complications: Secondary | ICD-10-CM | POA: Insufficient documentation

## 2024-03-23 DIAGNOSIS — J9801 Acute bronchospasm: Secondary | ICD-10-CM

## 2024-03-23 DIAGNOSIS — G4700 Insomnia, unspecified: Secondary | ICD-10-CM | POA: Insufficient documentation

## 2024-03-23 DIAGNOSIS — J9601 Acute respiratory failure with hypoxia: Secondary | ICD-10-CM | POA: Diagnosis not present

## 2024-03-23 DIAGNOSIS — C7989 Secondary malignant neoplasm of other specified sites: Secondary | ICD-10-CM | POA: Diagnosis not present

## 2024-03-23 LAB — CBC WITH DIFFERENTIAL/PLATELET
Abs Immature Granulocytes: 0.01 K/uL (ref 0.00–0.07)
Basophils Absolute: 0 K/uL (ref 0.0–0.1)
Basophils Relative: 0 %
Eosinophils Absolute: 0.4 K/uL (ref 0.0–0.5)
Eosinophils Relative: 6 %
HCT: 40.6 % (ref 36.0–46.0)
Hemoglobin: 12.6 g/dL (ref 12.0–15.0)
Immature Granulocytes: 0 %
Lymphocytes Relative: 35 %
Lymphs Abs: 2.3 K/uL (ref 0.7–4.0)
MCH: 28.1 pg (ref 26.0–34.0)
MCHC: 31 g/dL (ref 30.0–36.0)
MCV: 90.6 fL (ref 80.0–100.0)
Monocytes Absolute: 0.8 K/uL (ref 0.1–1.0)
Monocytes Relative: 12 %
Neutro Abs: 3.1 K/uL (ref 1.7–7.7)
Neutrophils Relative %: 47 %
Platelets: 229 K/uL (ref 150–400)
RBC: 4.48 MIL/uL (ref 3.87–5.11)
RDW: 16.5 % — ABNORMAL HIGH (ref 11.5–15.5)
WBC: 6.6 K/uL (ref 4.0–10.5)
nRBC: 0 % (ref 0.0–0.2)

## 2024-03-23 LAB — RESP PANEL BY RT-PCR (RSV, FLU A&B, COVID)  RVPGX2
Influenza A by PCR: NEGATIVE
Influenza B by PCR: NEGATIVE
Resp Syncytial Virus by PCR: NEGATIVE
SARS Coronavirus 2 by RT PCR: NEGATIVE

## 2024-03-23 LAB — COMPREHENSIVE METABOLIC PANEL WITH GFR
ALT: 18 U/L (ref 0–44)
AST: 23 U/L (ref 15–41)
Albumin: 3.3 g/dL — ABNORMAL LOW (ref 3.5–5.0)
Alkaline Phosphatase: 40 U/L (ref 38–126)
Anion gap: 10 (ref 5–15)
BUN: 32 mg/dL — ABNORMAL HIGH (ref 6–20)
CO2: 26 mmol/L (ref 22–32)
Calcium: 8.6 mg/dL — ABNORMAL LOW (ref 8.9–10.3)
Chloride: 104 mmol/L (ref 98–111)
Creatinine, Ser: 1.38 mg/dL — ABNORMAL HIGH (ref 0.44–1.00)
GFR, Estimated: 44 mL/min — ABNORMAL LOW (ref >60)
Glucose, Bld: 116 mg/dL — ABNORMAL HIGH (ref 70–99)
Potassium: 3.6 mmol/L (ref 3.5–5.1)
Sodium: 140 mmol/L (ref 135–145)
Total Bilirubin: 0.3 mg/dL (ref 0.0–1.2)
Total Protein: 7.1 g/dL (ref 6.5–8.1)

## 2024-03-23 LAB — BLOOD GAS, VENOUS
Acid-Base Excess: 0.8 mmol/L (ref 0.0–2.0)
Bicarbonate: 27.1 mmol/L (ref 20.0–28.0)
O2 Saturation: 60.2 %
Patient temperature: 37
pCO2, Ven: 49 mmHg (ref 44–60)
pH, Ven: 7.35 (ref 7.25–7.43)
pO2, Ven: 34 mmHg (ref 32–45)

## 2024-03-23 LAB — BRAIN NATRIURETIC PEPTIDE: B Natriuretic Peptide: 56.3 pg/mL (ref 0.0–100.0)

## 2024-03-23 MED ORDER — SODIUM CHLORIDE 0.9 % IV SOLN
1.0000 g | INTRAVENOUS | Status: DC
  Administered 2024-03-24 (×2): 1 g via INTRAVENOUS
  Filled 2024-03-23: qty 10

## 2024-03-23 MED ORDER — SODIUM CHLORIDE 0.9 % IV BOLUS: 1000.0000 mL | Freq: Once | INTRAVENOUS | Status: DC

## 2024-03-23 MED ORDER — GUAIFENESIN 100 MG/5ML PO LIQD: 5.0000 mL | ORAL | Status: DC | PRN

## 2024-03-23 MED ORDER — ENOXAPARIN SODIUM 40 MG/0.4ML IJ SOSY
40.0000 mg | PREFILLED_SYRINGE | INTRAMUSCULAR | Status: DC
  Administered 2024-03-24 (×4): 40 mg via SUBCUTANEOUS
  Filled 2024-03-23 (×2): qty 0.4

## 2024-03-23 MED ORDER — MELATONIN 5 MG PO TABS
5.0000 mg | ORAL_TABLET | Freq: Every evening | ORAL | Status: DC | PRN
  Administered 2024-03-24 (×2): 5 mg via ORAL
  Filled 2024-03-23: qty 1

## 2024-03-23 MED ORDER — SODIUM CHLORIDE 0.9 % IV SOLN
100.0000 mg | Freq: Two times a day (BID) | INTRAVENOUS | Status: DC
  Administered 2024-03-24 (×2): 100 mg via INTRAVENOUS
  Filled 2024-03-23 (×2): qty 100

## 2024-03-23 MED ORDER — CLOPIDOGREL BISULFATE 75 MG PO TABS
75.0000 mg | ORAL_TABLET | Freq: Every day | ORAL | Status: DC
  Administered 2024-03-24 – 2024-03-25 (×4): 75 mg via ORAL
  Filled 2024-03-23 (×2): qty 1

## 2024-03-23 MED ORDER — TRAZODONE HCL 50 MG PO TABS
50.0000 mg | ORAL_TABLET | Freq: Every day | ORAL | Status: DC
Start: 2024-03-23 — End: 2024-03-25
  Administered 2024-03-24 (×4): 50 mg via ORAL
  Filled 2024-03-23 (×2): qty 1

## 2024-03-23 MED ORDER — POLYETHYLENE GLYCOL 3350 17 G PO PACK: 17.0000 g | PACK | Freq: Every day | ORAL | Status: DC | PRN

## 2024-03-23 MED ORDER — OXYCODONE HCL 5 MG PO TABS
5.0000 mg | ORAL_TABLET | Freq: Four times a day (QID) | ORAL | Status: DC | PRN
  Administered 2024-03-24 (×2): 5 mg via ORAL
  Filled 2024-03-23: qty 1

## 2024-03-23 MED ORDER — ACETAMINOPHEN 325 MG PO TABS
650.0000 mg | ORAL_TABLET | Freq: Four times a day (QID) | ORAL | Status: DC | PRN
  Administered 2024-03-24 (×2): 650 mg via ORAL
  Filled 2024-03-23: qty 2

## 2024-03-23 MED ORDER — DOXAZOSIN MESYLATE 1 MG PO TABS
4.0000 mg | ORAL_TABLET | Freq: Every day | ORAL | Status: DC
  Administered 2024-03-24 (×2): 4 mg via ORAL
  Filled 2024-03-23: qty 4

## 2024-03-23 MED ORDER — METHYLPREDNISOLONE SODIUM SUCC 125 MG IJ SOLR
125.0000 mg | Freq: Once | INTRAMUSCULAR | Status: AC
  Administered 2024-03-23: 125 mg via INTRAVENOUS
  Filled 2024-03-23: qty 2

## 2024-03-23 MED ORDER — LORATADINE 10 MG PO TABS
10.0000 mg | ORAL_TABLET | Freq: Every evening | ORAL | Status: DC
  Administered 2024-03-24 (×2): 10 mg via ORAL
  Filled 2024-03-23: qty 1

## 2024-03-23 MED ORDER — PROCHLORPERAZINE EDISYLATE 10 MG/2ML IJ SOLN: 5.0000 mg | Freq: Four times a day (QID) | INTRAMUSCULAR | Status: DC | PRN

## 2024-03-23 MED ORDER — IPRATROPIUM-ALBUTEROL 0.5-2.5 (3) MG/3ML IN SOLN
3.0000 mL | Freq: Four times a day (QID) | RESPIRATORY_TRACT | Status: DC
  Administered 2024-03-24 – 2024-03-25 (×12): 3 mL via RESPIRATORY_TRACT
  Filled 2024-03-23 (×6): qty 3

## 2024-03-23 MED ORDER — ASPIRIN 81 MG PO TBEC
81.0000 mg | DELAYED_RELEASE_TABLET | Freq: Every day | ORAL | Status: DC
  Administered 2024-03-24 – 2024-03-25 (×4): 81 mg via ORAL
  Filled 2024-03-23 (×2): qty 1

## 2024-03-23 MED ORDER — IPRATROPIUM-ALBUTEROL 0.5-2.5 (3) MG/3ML IN SOLN
3.0000 mL | Freq: Once | RESPIRATORY_TRACT | Status: AC
  Administered 2024-03-23: 3 mL via RESPIRATORY_TRACT
  Filled 2024-03-23: qty 3

## 2024-03-23 MED ORDER — MORPHINE SULFATE (PF) 2 MG/ML IV SOLN: 2.0000 mg | INTRAVENOUS | Status: DC | PRN

## 2024-03-23 MED ORDER — METHYLPREDNISOLONE SODIUM SUCC 40 MG IJ SOLR
40.0000 mg | Freq: Every day | INTRAMUSCULAR | Status: DC
  Administered 2024-03-24 (×2): 40 mg via INTRAVENOUS
  Filled 2024-03-23 (×2): qty 1

## 2024-03-23 NOTE — ED Provider Notes (Signed)
 [facility] Provider Note  CSN: [id number] Arrival date & time: 03/23/24 2020  Chief Complaint(s) Shortness of Breath  HPI Krista [name] is a 63 y.o. 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  Patient reports shortness of breath for the past week which has been progressively worsening.  Reports that she had some inhalers and nebulizer at home but did not help much.  Denies any history of asthma or COPD.  Denies similar episode previously.  Does not typically use oxygen.   Past Medical History Past Medical History:  Diagnosis Date  . Arthritis   . CAD (coronary artery disease)   . Depression   . Diabetes mellitus   . Hypertension   . NSTEMI (non-ST elevated myocardial infarction) [facility])    Patient Active Problem List   Diagnosis Date Noted  . Acute hypoxic respiratory failure ([name]) 03/23/2024  . Insomnia 02/14/2024  . HFrEF (heart failure with reduced ejection fraction) ([name]) 01/23/2024  . 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 Cardiomyopathy ([name]) 05/09/2022  . Peripheral neuropathy due to chemotherapy ([name]) 03/14/2022  . Anemia due to antineoplastic chemotherapy 03/14/2022  . Pancytopenia, acquired ([name]) 02/09/2022  . Other constipation 02/09/2022  . Generalized pain 01/28/2022  . Rib fractures 01/28/2022  . Metastatic disease ([name]) 01/28/2022  . Endometrial cancer ([name]) 01/28/2022  . Cough 01/28/2022  . Elevated serum creatinine 01/28/2022  . Protein-calorie malnutrition, moderate ([name]) 01/28/2022  . Goals of care, counseling/discussion 01/28/2022  . Elevated troponin 01/28/2022  . Hydronephrosis of right kidney 01/28/2022  . Hemoptysis 01/28/2022  . Metastasis to lung ([name]) 01/28/2022  . CKD (chronic kidney disease), stage III ([name]) 01/28/2022  . Medical non-compliance 01/28/2022  .  Statin myopathy 11/05/2019  . Dyspnea   . Knee pain   . Rhabdomyolysis due to statin therapy   . Demand ischemia ([name])   . Sepsis ([name]) 05/13/2019  . NSTEMI (non-ST elevated myocardial infarction) ([name]) 05/13/2019  . Coronary artery disease 02/12/2019  . Hyperlipidemia 02/12/2019  . Unstable angina ([name])   . Near syncope 12/14/2018  . Chest tightness 12/14/2018  . Prolonged Q-T interval on ECG   . Acute gastroenteritis 08/02/2012  . Dehydration 08/02/2012  . AKI (acute kidney injury) ([name]) 08/02/2012  . Hypokalemia 08/02/2012  . Type 2 diabetes mellitus ([name]) 08/02/2012  . HTN (hypertension) 08/02/2012  . Tobacco abuse 08/02/2012  . Allergic rhinitis 10/21/2010  . Disorder of bursae and tendons in shoulder region 10/21/2010  . SEBACEOUS CYST, INFECTED 01/31/2010  . GUAIAC POSITIVE STOOL 10/05/2009  . RECTAL BLEEDING, HX OF 09/21/2009  . Nonspecific (abnormal) findings on radiological and other examination of body structure 05/19/2009  . RIGHT LOWER LUNG FIELD CRACKLES 05/19/2009  . Diabetes ([name]) 02/12/2009  . Pain in limb 02/12/2009  . DENTAL CARIES 04/21/2008  . Essential hypertension 05/01/2007   Home Medication(s) Prior to Admission medications   Medication Sig Start Date End Date Taking? Authorizing Provider  acetaminophen  (TYLENOL ) 500 MG tablet Take 1,000 mg by mouth every 6 (six) hours as needed for mild pain or headache.   Yes [provider]  aspirin  EC 81 MG tablet Take 1 tablet (81 mg total) by mouth daily. Swallow whole. 09/22/22  Yes Court Dorn PARAS, MD  clopidogrel  (PLAVIX ) 75 MG tablet Take 75 mg by mouth daily.   Yes [provider]  dapagliflozin  propanediol (FARXIGA ) 10 MG TABS tablet  Take 1 tablet (10 mg total) by mouth daily before breakfast. 01/23/24  Yes Sabharwal, Aditya, DO  doxazosin  (CARDURA ) 4 MG tablet Take 1 tablet (4 mg total) by mouth daily. 05/29/23  Yes Gorsuch, Ni, MD  hydrochlorothiazide  (HYDRODIURIL ) 25 MG tablet Take 1  tablet (25 mg total) by mouth daily. 08/30/23  Yes Gorsuch, Ni, MD  levocetirizine (XYZAL) 5 MG tablet Take 5 mg by mouth every evening.   Yes [provider]  lidocaine -prilocaine  (EMLA ) cream Apply 1 application topically as needed. 02/09/22  Yes Lonn Hicks, MD  losartan  (COZAAR ) 100 MG tablet Take 1 tablet by mouth once daily 02/22/24  Yes Gorsuch, Ni, MD  metFORMIN  (GLUCOPHAGE ) 500 MG tablet Take 500 mg by mouth daily. 04/29/19  Yes [provider]  metoprolol  succinate (TOPROL  XL) 100 MG 24 hr tablet Take 1 tablet (100 mg total) by mouth daily. 02/14/24  Yes Gorsuch, Ni, MD  nitroGLYCERIN  (NITROSTAT ) 0.4 MG SL tablet Place 1 tablet (0.4 mg total) under the tongue every 5 (five) minutes as needed for chest pain. 10/28/19  Yes Court Dorn PARAS, MD  polyethylene glycol (MIRALAX ) 17 g packet Take 17 g by mouth daily. Patient taking differently: Take 17 g by mouth daily as needed for mild constipation or moderate constipation. 02/09/22  Yes Gorsuch, Ni, MD  traZODone  (DESYREL ) 50 MG tablet Take 1 tablet (50 mg total) by mouth at bedtime. 02/14/24  Yes Gorsuch, Ni, MD  Evolocumab  (REPATHA  SURECLICK) 140 MG/ML SOAJ Inject 140 mg into the skin every 14 (fourteen) days. Patient not taking: Reported on 03/23/2024 02/20/24   Gardenia Led, DO                                                                                                                                    Past Surgical History Past Surgical History:  Procedure Laterality Date  . ANKLE SURGERY     left ankle - was broken  . CORONARY STENT INTERVENTION N/A 12/16/2018   Procedure: CORONARY STENT INTERVENTION;  Surgeon: Anner Alm ORN, MD;  Location: [facility] INVASIVE CV LAB;  Service: Cardiovascular;  Laterality: N/A;  LAD  . IR IMAGING GUIDED PORT INSERTION  01/31/2022  . IR REMOVAL TUN ACCESS W/ PORT W/O FL MOD SED  01/31/2022  . LEFT HEART CATH AND CORONARY ANGIOGRAPHY N/A 12/16/2018   Procedure: LEFT HEART CATH AND CORONARY  ANGIOGRAPHY;  Surgeon: Anner Alm ORN, MD;  Location: [facility] INVASIVE CV LAB;  Service: Cardiovascular;  Laterality: N/A;   Family History Family History  Problem Relation Age of Onset  . Diabetes Mother   . COPD Mother   . Hypertension Mother   . Diabetes Father   . Hypertension Father     Social History Social History   Tobacco Use  . Smoking status: Former    Current packs/day: 0.00    Average packs/day: 0.5 packs/day for 33.0 years (16.5 ttl pk-yrs)    Types:  Cigarettes    Start date: 01/08/1989    Quit date: 01/08/2022    Years since quitting: 2.2  . Smokeless tobacco: Never  Vaping Use  . Vaping status: Never Used  Substance Use Topics  . Alcohol use: No  . Drug use: No   Allergies Statins, Agrostis alba pollen extract [gramineae pollens], and Zetia [ezetimibe]  Review of Systems Review of Systems  All other systems reviewed and are negative.   Physical Exam Vital Signs  I have reviewed the triage vital signs BP (!) 190/95   Pulse 68   Temp 98.3 F (36.8 C) (Oral)   Resp 20   Ht 5' 3 (1.6 m)   Wt 74.8 kg   SpO2 98%   BMI 29.23 kg/m  Physical Exam Vitals and nursing note reviewed.  Constitutional:      General: She is not in acute distress.    Appearance: She is well-developed.  HENT:     Head: Normocephalic and atraumatic.     Mouth/Throat:     Mouth: Mucous membranes are moist.  Eyes:     Pupils: Pupils are equal, round, and reactive to light.  Cardiovascular:     Rate and Rhythm: Normal rate and regular rhythm.     Heart sounds: No murmur heard. Pulmonary:     Comments: Mild tachypnea, increased work of breathing.  Diffuse wheezing on exam, no focal finding, no crackles Abdominal:     General: Abdomen is flat.     Palpations: Abdomen is soft.     Tenderness: There is no abdominal tenderness.  Musculoskeletal:        General: No tenderness.     Right lower leg: No edema.     Left lower leg: No edema.  Skin:    General: Skin is warm and  dry.  Neurological:     General: No focal deficit present.     Mental Status: She is alert. Mental status is at baseline.  Psychiatric:        Mood and Affect: Mood normal.        Behavior: Behavior normal.     ED Results and Treatments Labs (all labs ordered are listed, but only abnormal results are displayed) Labs Reviewed  COMPREHENSIVE METABOLIC PANEL WITH GFR - Abnormal; Notable for the following components:      Result Value   Glucose, Bld 116 (*)    BUN 32 (*)    Creatinine, Ser 1.38 (*)    Calcium  8.6 (*)    Albumin  3.3 (*)    GFR, Estimated 44 (*)    All other components within normal limits  CBC WITH DIFFERENTIAL/PLATELET - Abnormal; Notable for the following components:   RDW 16.5 (*)    All other components within normal limits  BASIC METABOLIC PANEL WITH GFR - Abnormal; Notable for the following components:   CO2 20 (*)    Glucose, Bld 366 (*)    BUN 34 (*)    Creatinine, Ser 1.37 (*)    Calcium  8.4 (*)    GFR, Estimated 44 (*)    All other components within normal limits  PHOSPHORUS - Abnormal; Notable for the following components:   Phosphorus 2.2 (*)    All other components within normal limits  CBC - Abnormal; Notable for the following components:   RDW 16.4 (*)    All other components within normal limits  D-DIMER, QUANTITATIVE - Abnormal; Notable for the following components:   D-Dimer, Quant 0.77 (*)  All other components within normal limits  GLUCOSE, CAPILLARY - Abnormal; Notable for the following components:   Glucose-Capillary 205 (*)    All other components within normal limits  GLUCOSE, CAPILLARY - Abnormal; Notable for the following components:   Glucose-Capillary 210 (*)    All other components within normal limits  RESP PANEL BY RT-PCR (RSV, FLU A&B, COVID)  RVPGX2  BLOOD GAS, VENOUS  BRAIN NATRIURETIC PEPTIDE  PROCALCITONIN  MAGNESIUM   HIV ANTIBODY (ROUTINE TESTING W REFLEX)  HEMOGLOBIN A1C  CBC  COMPREHENSIVE METABOLIC PANEL  WITH GFR                                                                                                                          Radiology DG Chest Portable 1 View Result Date: 03/23/2024 CLINICAL DATA:  shob EXAM: PORTABLE CHEST 1 VIEW COMPARISON:  CT chest 03/20/2024 FINDINGS: Right chest wall Port-A-Cath with tip overlying the superior caval junction. The heart and mediastinal contours are within normal limits. Atherosclerotic plaque. Emphysematous changes. No focal consolidation. No pulmonary edema. No pleural effusion. No pneumothorax. No acute osseous abnormality. IMPRESSION: 1. No active cardiopulmonary disease. 2. Aortic Atherosclerosis (ICD10-I70.0) and Emphysema (ICD10-J43.9). Electronically Signed   By: Morgane  Naveau M.D.   On: 03/23/2024 21:12    Pertinent labs & imaging results that were available during my care of the patient were reviewed by me and considered in my medical decision making (see MDM for details).  Medications Ordered in ED Medications  enoxaparin  (LOVENOX ) injection 40 mg (40 mg Subcutaneous Given 03/24/24 0014)  ipratropium-albuterol  (DUONEB) 0.5-2.5 (3) MG/3ML nebulizer solution 3 mL (3 mLs Nebulization Given 03/24/24 1429)  methylPREDNISolone  sodium succinate (SOLU-MEDROL ) 40 mg/mL injection 40 mg (40 mg Intravenous Given 03/24/24 0947)  guaiFENesin  (ROBITUSSIN) 100 MG/5ML liquid 5 mL (has no administration in time range)  acetaminophen  (TYLENOL ) tablet 650 mg (650 mg Oral Given 03/24/24 0607)  prochlorperazine  (COMPAZINE ) injection 5 mg (has no administration in time range)  polyethylene glycol (MIRALAX  / GLYCOLAX ) packet 17 g (has no administration in time range)  melatonin tablet 5 mg (has no administration in time range)  oxyCODONE  (Oxy IR/ROXICODONE ) immediate release tablet 5 mg (5 mg Oral Given 03/24/24 1450)  traZODone  (DESYREL ) tablet 50 mg (50 mg Oral Given 03/24/24 0013)  loratadine  (CLARITIN ) tablet 10 mg (10 mg Oral Given 03/24/24 1814)  doxazosin   (CARDURA ) tablet 4 mg (4 mg Oral Given 03/24/24 0948)  aspirin  EC tablet 81 mg (81 mg Oral Given 03/24/24 0952)  clopidogrel  (PLAVIX ) tablet 75 mg (75 mg Oral Given 03/24/24 0951)  metoprolol  succinate (TOPROL -XL) 24 hr tablet 100 mg (100 mg Oral Given 03/24/24 0951)  insulin  aspart (novoLOG ) injection 0-15 Units (5 Units Subcutaneous Given 03/24/24 1814)  insulin  aspart (novoLOG ) injection 0-5 Units (has no administration in time range)  dapagliflozin  propanediol (FARXIGA ) tablet 10 mg (has no administration in time range)  hydrALAZINE  (APRESOLINE ) tablet 50 mg (50 mg Oral Given 03/24/24 1508)  hydrALAZINE  (APRESOLINE ) injection 5  mg (has no administration in time range)  ipratropium-albuterol  (DUONEB) 0.5-2.5 (3) MG/3ML nebulizer solution 3 mL (3 mLs Nebulization Given 03/23/24 2104)  methylPREDNISolone  sodium succinate (SOLU-MEDROL ) 125 mg/2 mL injection 125 mg (125 mg Intravenous Given 03/23/24 2104)  ipratropium-albuterol  (DUONEB) 0.5-2.5 (3) MG/3ML nebulizer solution 3 mL (3 mLs Nebulization Given 03/23/24 2208)                                                                                                                                     Procedures .Critical Care  Performed by: Francesca Elsie CROME, MD Authorized by: Francesca Elsie CROME, MD   Critical care provider statement:    Critical care time (minutes):  31   Critical care was necessary to treat or prevent imminent or life-threatening deterioration of the following conditions:  Respiratory failure   Critical care was time spent personally by me on the following activities:  Development of treatment plan with patient or surrogate, discussions with consultants, evaluation of patient's response to treatment, examination of patient, ordering and review of laboratory studies, ordering and review of radiographic studies, ordering and performing treatments and interventions, pulse oximetry, re-evaluation of patient's condition and review of old  charts   Care discussed with: admitting provider     (including critical care time)  Medical Decision Making / ED Course   MDM:  63 year old presenting to the emergency department shortness of breath.  Patient overall well-appearing, does have mild increased work of breathing and dyspnea.  Patient is mildly hypoxic.   with wheeze, suspect underlying bronchospasm.  She is a former smoker and may have COPD.  Does have history of CHF, but appears clinically euvolemic with no lower extremity swelling or significant JVD, will check BNP.  Differential also includes pneumonia or pneumothorax.  Will check chest x-ray.  Patient denies any chest pain, leg swelling to suggest other process such as PE and she has clear pulmonary cause with wheezing on exam.  Will reassess.      Additional history obtained: -Additional history obtained from family and ems -External records from outside source obtained and reviewed including: Chart review including previous notes, labs, imaging, consultation notes including prior notes    Lab Tests: -I ordered, reviewed, and interpreted labs.   The pertinent results include:   Labs Reviewed  COMPREHENSIVE METABOLIC PANEL WITH GFR - Abnormal; Notable for the following components:      Result Value   Glucose, Bld 116 (*)    BUN 32 (*)    Creatinine, Ser 1.38 (*)    Calcium  8.6 (*)    Albumin  3.3 (*)    GFR, Estimated 44 (*)    All other components within normal limits  CBC WITH DIFFERENTIAL/PLATELET - Abnormal; Notable for the following components:   RDW 16.5 (*)    All other components within normal limits  BASIC METABOLIC PANEL WITH GFR - Abnormal; Notable for the following components:  CO2 20 (*)    Glucose, Bld 366 (*)    BUN 34 (*)    Creatinine, Ser 1.37 (*)    Calcium  8.4 (*)    GFR, Estimated 44 (*)    All other components within normal limits  PHOSPHORUS - Abnormal; Notable for the following components:   Phosphorus 2.2 (*)    All other  components within normal limits  CBC - Abnormal; Notable for the following components:   RDW 16.4 (*)    All other components within normal limits  D-DIMER, QUANTITATIVE - Abnormal; Notable for the following components:   D-Dimer, Quant 0.77 (*)    All other components within normal limits  GLUCOSE, CAPILLARY - Abnormal; Notable for the following components:   Glucose-Capillary 205 (*)    All other components within normal limits  GLUCOSE, CAPILLARY - Abnormal; Notable for the following components:   Glucose-Capillary 210 (*)    All other components within normal limits  RESP PANEL BY RT-PCR (RSV, FLU A&B, COVID)  RVPGX2  BLOOD GAS, VENOUS  BRAIN NATRIURETIC PEPTIDE  PROCALCITONIN  MAGNESIUM   HIV ANTIBODY (ROUTINE TESTING W REFLEX)  HEMOGLOBIN A1C  CBC  COMPREHENSIVE METABOLIC PANEL WITH GFR     EKG   EKG Interpretation Date/Time:  Sunday March 23 2024 21:20:32 EDT Ventricular Rate:  77 PR Interval:  165 QRS Duration:  96 QT Interval:  436 QTC Calculation: 494 R Axis:   -12  Text Interpretation: Sinus rhythm Probable anteroseptal infarct, old Confirmed by Francesca Fallow ([id number]) on 03/23/2024 10:21:47 PM         Imaging Studies ordered: I ordered imaging studies including CXR On my interpretation imaging demonstrates no acute process I independently visualized and interpreted imaging. I agree with the radiologist interpretation   Medicines ordered and prescription drug management: Meds ordered this encounter  Medications  . ipratropium-albuterol  (DUONEB) 0.5-2.5 (3) MG/3ML nebulizer solution 3 mL  . methylPREDNISolone  sodium succinate (SOLU-MEDROL ) 125 mg/2 mL injection 125 mg    IV methylprednisolone  will be converted to either a q12h or q24h frequency with the same total daily dose (TDD).  Ordered Dose: 1 to 125 mg TDD; convert to: TDD q24h.  Ordered Dose: 126 to 250 mg TDD; convert to: TDD div q12h.  Ordered Dose: >250 mg TDD; DAW.  . DISCONTD: sodium  chloride 0.9 % bolus 1,000 mL  . ipratropium-albuterol  (DUONEB) 0.5-2.5 (3) MG/3ML nebulizer solution 3 mL  . enoxaparin  (LOVENOX ) injection 40 mg  . ipratropium-albuterol  (DUONEB) 0.5-2.5 (3) MG/3ML nebulizer solution 3 mL  . methylPREDNISolone  sodium succinate (SOLU-MEDROL ) 40 mg/mL injection 40 mg    IV methylprednisolone  will be converted to either a q12h or q24h frequency with the same total daily dose (TDD).  Ordered Dose: 1 to 125 mg TDD; convert to: TDD q24h.  Ordered Dose: 126 to 250 mg TDD; convert to: TDD div q12h.  Ordered Dose: >250 mg TDD; DAW.  . guaiFENesin  (ROBITUSSIN) 100 MG/5ML liquid 5 mL  . acetaminophen  (TYLENOL ) tablet 650 mg  . prochlorperazine  (COMPAZINE ) injection 5 mg  . polyethylene glycol (MIRALAX  / GLYCOLAX ) packet 17 g  . melatonin tablet 5 mg  . oxyCODONE  (Oxy IR/ROXICODONE ) immediate release tablet 5 mg    Refill:  0  . DISCONTD: morphine  (PF) 2 MG/ML injection 2 mg  . DISCONTD: cefTRIAXone  (ROCEPHIN ) 1 g in sodium chloride  0.9 % 100 mL IVPB    Antibiotic Indication::   Other Indication (list below)  . DISCONTD: doxycycline  (VIBRAMYCIN ) 100 mg in sodium chloride  0.9 %  250 mL IVPB    Antibiotic Indication::   Other Indication (list below)  . traZODone  (DESYREL ) tablet 50 mg  . loratadine  (CLARITIN ) tablet 10 mg  . doxazosin  (CARDURA ) tablet 4 mg  . aspirin  EC tablet 81 mg    Swallow whole.    . clopidogrel  (PLAVIX ) tablet 75 mg  . metoprolol  succinate (TOPROL -XL) 24 hr tablet 100 mg  . insulin  aspart (novoLOG ) injection 0-15 Units    Correction coverage::   Moderate (average weight, post-op)    CBG < 70::   Implement Hypoglycemia Standing Orders and refer to Hypoglycemia Standing Orders sidebar report    CBG 70 - 120::   0 units    CBG 121 - 150::   2 units    CBG 151 - 200::   3 units    CBG 201 - 250::   5 units    CBG 251 - 300::   8 units    CBG 301 - 350::   11 units    CBG 351 - 400::   15 units    CBG > 400:   call MD and obtain STAT lab  verification  . insulin  aspart (novoLOG ) injection 0-5 Units    Correction coverage::   HS scale    CBG < 70::   Implement Hypoglycemia Standing Orders and refer to Hypoglycemia Standing Orders sidebar report    CBG 70 - 120::   0 units    CBG 121 - 150::   0 units    CBG 151 - 200::   0 units    CBG 201 - 250::   2 units    CBG 251 - 300::   3 units    CBG 301 - 350::   4 units    CBG 351 - 400::   5 units    CBG > 400:   call MD and obtain STAT lab verification  . dapagliflozin  propanediol (FARXIGA ) tablet 10 mg  . hydrALAZINE  (APRESOLINE ) tablet 50 mg  . hydrALAZINE  (APRESOLINE ) injection 5 mg    -I have reviewed the patients home medicines and have made adjustments as needed   Consultations Obtained: I requested consultation with the hospitalist,  and discussed lab and imaging findings as well as pertinent plan - they recommend: admission  Social Determinants of Health:  Diagnosis or treatment significantly limited by social determinants of health: former smoker   Reevaluation: After the interventions noted above, I reevaluated the patient and found that their symptoms have improved  Co morbidities that complicate the patient evaluation . Past Medical History:  Diagnosis Date  . Arthritis   . CAD (coronary artery disease)   . Depression   . Diabetes mellitus   . Hypertension   . NSTEMI (non-ST elevated myocardial infarction) ([name])       Dispostion: Disposition decision including need for hospitalization was considered, and patient admitted to the hospital.    Final Clinical Impression(s) / ED Diagnoses Final diagnoses:  Acute hypoxic respiratory failure ([name])  Bronchospasm     This chart was dictated using voice recognition software.  Despite best efforts to proofread,  errors can occur which can change the documentation meaning.    Francesca Elsie CROME, MD 03/24/24 [phone]

## 2024-03-23 NOTE — H&P (Signed)
 History and Physical  Krista [name] DOB: 1961/07/12 DOA: 03/23/2024  Referring physician: Dr. Francesca, EDP  PCP: Lang Dedra DASEN, MD  Outpatient Specialists: Medical oncology Patient coming from: Home  Chief Complaint: Shortness of breath   HPI: Krista [name] is a 63 y.o. 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  Associated with a productive cough with white phlegm.  Symptoms are worse with ambulation.  Denies chest pain or palpitations.  Former smoker, 20+ years, quit a year ago.  No prior testing or diagnosis of COPD.  In the ER, the patient was noted to be hypoxic with O2 saturation in the 80s on room air, improved on 2 L nasal cannula to the low 90s.  Chest x-ray was unrevealing.  No lower extremity edema or tenderness with palpation.  Due to concern for undiagnosed COPD exacerbation, the patient received IV Solu-Medrol  125 mg x 1, imageable rounds of DuoNebs nebulizers.  TRH, hospitalist service, was asked to admit.  ED Course: Temperature 98.4.  BP 156/95, pulse 63, respiration rate 24, O2 saturation 93% on 4 L.  Lab studies notable for BUN 32, creatinine 1.38, at baseline with GFR 44.  BNP 56.  Review of Systems: Review of systems as noted in the HPI. All other systems reviewed and are negative.   Past Medical History:  Diagnosis Date   Arthritis    CAD (coronary artery disease)    Depression    Diabetes mellitus    Hypertension    NSTEMI (non-ST elevated myocardial infarction) [facility])    Past Surgical History:  Procedure Laterality Date   ANKLE SURGERY     left ankle - was broken   CORONARY STENT INTERVENTION N/A 12/16/2018   Procedure: CORONARY STENT INTERVENTION;  Surgeon: Anner Alm ORN, MD;  Location: [facility] INVASIVE CV LAB;  Service: Cardiovascular;   Laterality: N/A;  LAD   IR IMAGING GUIDED PORT INSERTION  01/31/2022   IR REMOVAL TUN ACCESS W/ PORT W/O FL MOD SED  01/31/2022   LEFT HEART CATH AND CORONARY ANGIOGRAPHY N/A 12/16/2018   Procedure: LEFT HEART CATH AND CORONARY ANGIOGRAPHY;  Surgeon: Anner Alm ORN, MD;  Location: [facility] INVASIVE CV LAB;  Service: Cardiovascular;  Laterality: N/A;    Social History:  reports that she quit smoking about 2 years ago. Her smoking use included cigarettes. She started smoking about 35 years ago. She has a 16.5 pack-year smoking history. She has never used smokeless tobacco. She reports that she does not drink alcohol and does not use drugs.   Allergies  Allergen Reactions   Statins Itching    itching itching   Agrostis Alba Pollen Extract [Gramineae Pollens]    Zetia [Ezetimibe] Itching    Family History  Problem Relation Age of Onset   Diabetes Mother    COPD Mother    Hypertension Mother    Diabetes Father    Hypertension Father       Prior to Admission medications   Medication Sig Start Date End Date Taking? Authorizing Provider  aspirin  EC 81 MG tablet Take 1 tablet (81 mg total) by mouth daily. Swallow whole. 09/22/22  Yes Court Dorn PARAS, MD  clopidogrel  (PLAVIX ) 75 MG tablet Take 75 mg by mouth daily.   Yes [provider]  dapagliflozin  propanediol (FARXIGA ) 10 MG TABS tablet Take 1 tablet (  10 mg total) by mouth daily before breakfast. 01/23/24  Yes Sabharwal, Aditya, DO  doxazosin  (CARDURA ) 4 MG tablet Take 1 tablet (4 mg total) by mouth daily. 05/29/23  Yes Gorsuch, Almarie, MD  hydrochlorothiazide  (HYDRODIURIL ) 25 MG tablet Take 1 tablet (25 mg total) by mouth daily. 08/30/23  Yes Gorsuch, Ni, MD  levocetirizine (XYZAL) 5 MG tablet Take 5 mg by mouth every evening.   Yes [provider]  losartan  (COZAAR ) 100 MG tablet Take 1 tablet by mouth once daily 02/22/24  Yes Gorsuch, Ni, MD  metFORMIN  (GLUCOPHAGE ) 500 MG tablet Take 500 mg by mouth daily. 04/29/19  Yes [provider]  metoprolol  succinate (TOPROL  XL) 100 MG 24 hr tablet Take 1 tablet (100 mg total) by mouth daily. 02/14/24  Yes Gorsuch, Ni, MD  nitroGLYCERIN  (NITROSTAT ) 0.4 MG SL tablet Place 1 tablet (0.4 mg total) under the tongue every 5 (five) minutes as needed for chest pain. 10/28/19  Yes Court Dorn PARAS, MD  traZODone  (DESYREL ) 50 MG tablet Take 1 tablet (50 mg total) by mouth at bedtime. 02/14/24  Yes Gorsuch, Ni, MD  acetaminophen  (TYLENOL ) 500 MG tablet Take 1,000 mg by mouth every 6 (six) hours as needed for mild pain or headache.    [provider]  Evolocumab  (REPATHA  SURECLICK) 140 MG/ML SOAJ Inject 140 mg into the skin every 14 (fourteen) days. Patient not taking: Reported on 03/23/2024 02/20/24   Gardenia Led, DO  lidocaine -prilocaine  (EMLA ) cream Apply 1 application topically as needed. 02/09/22   Lonn Almarie, MD  loratadine  (CLARITIN ) 10 MG tablet Take 1 tablet (10 mg total) by mouth daily as needed for allergies. 10/28/19   Court Dorn PARAS, MD  ondansetron  (ZOFRAN ) 8 MG tablet Take 1 tablet (8 mg total) by mouth every 8 (eight) hours as needed for nausea. Patient not taking: Reported on 02/20/2024 02/09/22   Lonn Almarie, MD  polyethylene glycol (MIRALAX ) 17 g packet Take 17 g by mouth daily. 02/09/22   Lonn Almarie, MD  prochlorperazine  (COMPAZINE ) 10 MG tablet Take 1 tablet (10 mg total) by mouth every 6 (six) hours as needed for nausea or vomiting. Patient not taking: Reported on 02/20/2024 02/09/22   Lonn Almarie, MD  senna-docusate (SENOKOT-S) 8.6-50 MG tablet Take 2 tablets by mouth 2 (two) times daily. Patient not taking: Reported on 02/20/2024 02/09/22   Lonn Almarie, MD    Physical Exam: BP 132/84 (BP Location: Right Arm)   Temp 98.4 F (36.9 C) (Oral)   Resp (!) 21   Ht 5' 3 (1.6 m)   Wt 74.8 kg   SpO2 93%   BMI 29.23 kg/m   General: 63 y.o. year-old female well developed well nourished in no acute distress.  Alert and oriented x3. Cardiovascular: Regular rate  and rhythm with no rubs or gallops.  No thyromegaly or JVD noted.  No lower extremity edema. 2/4 pulses in all 4 extremities. Respiratory: Clear to auscultation with no wheezes or rales. Good inspiratory effort. Abdomen: Soft nontender nondistended with normal bowel sounds x4 quadrants. Muskuloskeletal: No cyanosis, clubbing or edema noted bilaterally Neuro: CN II-XII intact, strength, sensation, reflexes Skin: No ulcerative lesions noted or rashes Psychiatry: Judgement and insight appear normal. Mood is appropriate for condition and setting          Labs on Admission:  Basic Metabolic Panel: Recent Labs  Lab 03/23/24 2030  NA 140  K 3.6  CL 104  CO2 26  GLUCOSE 116*  BUN 32*  CREATININE 1.38*  CALCIUM  8.6*   Liver Function Tests: Recent Labs  Lab 03/23/24 2030  AST 23  ALT 18  ALKPHOS 40  BILITOT 0.3  PROT 7.1  ALBUMIN  3.3*   No results for input(s): LIPASE, AMYLASE in the last 168 hours. No results for input(s): AMMONIA in the last 168 hours. CBC: Recent Labs  Lab 03/23/24 2030  WBC 6.6  NEUTROABS 3.1  HGB 12.6  HCT 40.6  MCV 90.6  PLT 229   Cardiac Enzymes: No results for input(s): CKTOTAL, CKMB, CKMBINDEX, TROPONINI in the last 168 hours.  BNP (last 3 results) Recent Labs    01/23/24 1234 02/20/24 1240 03/23/24 2030  BNP 112.4* 75.1 56.3    ProBNP (last 3 results) No results for input(s): PROBNP in the last 8760 hours.  CBG: No results for input(s): GLUCAP in the last 168 hours.  Radiological Exams on Admission: DG Chest Portable 1 View Result Date: 03/23/2024 CLINICAL DATA:  shob EXAM: PORTABLE CHEST 1 VIEW COMPARISON:  CT chest 03/20/2024 FINDINGS: Right chest wall Port-A-Cath with tip overlying the superior caval junction. The heart and mediastinal contours are within normal limits. Atherosclerotic plaque. Emphysematous changes. No focal consolidation. No pulmonary edema. No pleural effusion. No pneumothorax. No acute  osseous abnormality. IMPRESSION: 1. No active cardiopulmonary disease. 2. Aortic Atherosclerosis (ICD10-I70.0) and Emphysema (ICD10-J43.9). Electronically Signed   By: Morgane  Naveau M.D.   On: 03/23/2024 21:12    EKG: I independently viewed the EKG done and my findings are as followed: Sinus rhythm rate of 77.  Nonspecific ST-T changes.  QTc 494.  Assessment/Plan Present on Admission:  Acute hypoxic respiratory failure ([name])  Principal Problem:   Acute hypoxic respiratory failure ([name])  Acute hypoxic respiratory failure secondary to presumed undiagnosed COPD with exacerbation. Continue nebulized bronchodilators, IV steroids Add Rocephin  and IV doxycycline , cannot rule out early pneumonia Add incentive spirometer and early mobilization As needed antitussives Follow-up baseline procalcitonin Monitor fever curve and WBCs Maintain O2 saturation above 92%  Presumed undiagnosed COPD with exacerbation Management as stated above  Endometrial cancer with metastasis to the lungs Followed by Dr. Lonn, hematology/oncology  Chronic diastolic CHF Last 2D echo done on 02/05/2024 revealed LVEF 60-65% Euvolemic on exam Monitor strict I's and O's and daily weight  Insomnia Resume home trazodone   Coronary artery disease Denies any anginal symptoms Resume home DAPT.   Time: 75 minutes.    DVT prophylaxis: Subcu Lovenox  daily.  Code Status: DNR.  Family Communication: Mother and aunt present at bedside.  Disposition Plan: Admitted to telemetry unit.  Consults called: None.  Admission status: Observation status.   Status is: Observation    Krista LOISE Hurst MD [facility] Pager [phone]  If 7PM-7AM, please contact night-coverage [email or web address] Password [facility]  03/23/2024, 10:27 PM

## 2024-03-23 NOTE — ED Triage Notes (Signed)
 BIBA from home. Complaints of SHOB all week that got worse today. At home nebulizer didn't help.  Hx current lung cancer pt, recent congestion  10 mg albuterol , 0.5mg  Atrovent given by EMS  130/80 HR 77 RR 30 90% RA, put on 4 L

## 2024-03-24 ENCOUNTER — Other Ambulatory Visit (HOSPITAL_BASED_OUTPATIENT_CLINIC_OR_DEPARTMENT_OTHER): Payer: Self-pay

## 2024-03-24 DIAGNOSIS — J9621 Acute and chronic respiratory failure with hypoxia: Secondary | ICD-10-CM | POA: Diagnosis not present

## 2024-03-24 DIAGNOSIS — J9601 Acute respiratory failure with hypoxia: Secondary | ICD-10-CM | POA: Diagnosis not present

## 2024-03-24 LAB — CBC
HCT: 39.8 % (ref 36.0–46.0)
Hemoglobin: 12.7 g/dL (ref 12.0–15.0)
MCH: 28.6 pg (ref 26.0–34.0)
MCHC: 31.9 g/dL (ref 30.0–36.0)
MCV: 89.6 fL (ref 80.0–100.0)
Platelets: 214 K/uL (ref 150–400)
RBC: 4.44 MIL/uL (ref 3.87–5.11)
RDW: 16.4 % — ABNORMAL HIGH (ref 11.5–15.5)
WBC: 6.8 K/uL (ref 4.0–10.5)
nRBC: 0 % (ref 0.0–0.2)

## 2024-03-24 LAB — GLUCOSE, CAPILLARY
Glucose-Capillary: 205 mg/dL — ABNORMAL HIGH (ref 70–99)
Glucose-Capillary: 210 mg/dL — ABNORMAL HIGH (ref 70–99)
Glucose-Capillary: 161 mg/dL — ABNORMAL HIGH (ref 70–99)

## 2024-03-24 LAB — BASIC METABOLIC PANEL WITH GFR
Anion gap: 15 (ref 5–15)
BUN: 34 mg/dL — ABNORMAL HIGH (ref 6–20)
CO2: 20 mmol/L — ABNORMAL LOW (ref 22–32)
Calcium: 8.4 mg/dL — ABNORMAL LOW (ref 8.9–10.3)
Chloride: 100 mmol/L (ref 98–111)
Creatinine, Ser: 1.37 mg/dL — ABNORMAL HIGH (ref 0.44–1.00)
GFR, Estimated: 44 mL/min — ABNORMAL LOW (ref >60)
Glucose, Bld: 366 mg/dL — ABNORMAL HIGH (ref 70–99)
Potassium: 4 mmol/L (ref 3.5–5.1)
Sodium: 135 mmol/L (ref 135–145)

## 2024-03-24 LAB — PROCALCITONIN: Procalcitonin: <0.1 ng/mL

## 2024-03-24 LAB — HIV ANTIBODY (ROUTINE TESTING W REFLEX): HIV Screen 4th Generation wRfx: NONREACTIVE

## 2024-03-24 LAB — PHOSPHORUS: Phosphorus: 2.2 mg/dL — ABNORMAL LOW (ref 2.5–4.6)

## 2024-03-24 LAB — MAGNESIUM: Magnesium: 2 mg/dL (ref 1.7–2.4)

## 2024-03-24 LAB — D-DIMER, QUANTITATIVE: D-Dimer, Quant: 0.77 ug{FEU}/mL — ABNORMAL HIGH (ref 0.00–0.50)

## 2024-03-24 MED ORDER — INSULIN ASPART 100 UNIT/ML IJ SOLN
0.0000 [IU] | Freq: Three times a day (TID) | INTRAMUSCULAR | Status: DC
  Administered 2024-03-24 (×4): 5 [IU] via SUBCUTANEOUS

## 2024-03-24 MED ORDER — INSULIN ASPART 100 UNIT/ML IJ SOLN: 0.0000 [IU] | Freq: Every day | INTRAMUSCULAR | Status: DC

## 2024-03-24 MED ORDER — HYDRALAZINE HCL 20 MG/ML IJ SOLN: 5.0000 mg | Freq: Four times a day (QID) | INTRAMUSCULAR | Status: DC | PRN

## 2024-03-24 MED ORDER — DAPAGLIFLOZIN PROPANEDIOL 10 MG PO TABS
10.0000 mg | ORAL_TABLET | Freq: Every day | ORAL | Status: DC
  Administered 2024-03-25 (×2): 10 mg via ORAL
  Filled 2024-03-24: qty 1

## 2024-03-24 MED ORDER — HYDRALAZINE HCL 50 MG PO TABS
50.0000 mg | ORAL_TABLET | Freq: Three times a day (TID) | ORAL | Status: DC
  Administered 2024-03-24 – 2024-03-25 (×6): 50 mg via ORAL
  Filled 2024-03-24 (×3): qty 1

## 2024-03-24 MED ORDER — METOPROLOL SUCCINATE ER 50 MG PO TB24
100.0000 mg | ORAL_TABLET | Freq: Every day | ORAL | Status: DC
  Administered 2024-03-24 – 2024-03-25 (×4): 100 mg via ORAL
  Filled 2024-03-24 (×2): qty 2

## 2024-03-24 NOTE — Progress Notes (Signed)
 SATURATION QUALIFICATIONS: (This note is used to comply with regulatory documentation for home oxygen)  Patient Saturations on Room Air at Rest = 97-98%  Patient Saturations on Room Air while Ambulating = 97-98%  Patient Saturations on 0 Liters of oxygen while Ambulating = 97-98%  Please briefly explain why patient needs home oxygen: Patient does not need oxygen I have weaned her down to 2L O2 and she was 99%.

## 2024-03-24 NOTE — Evaluation (Signed)
 Physical Therapy Evaluation Patient Details Name: Krista [name] MRN: [id number] DOB: 1961/05/23 Today's Date: 03/24/2024  History of Present Illness  Krista [name] is a 63 y.o. 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   Clinical Impression  Pt is a 63 y.o. female with above HPI. Pt typically modified independent at baseline. Currently presenting with decreased activity tolerance. Pt able to perform transfers and ambulate 54ft x2 with MOD I and use of rollator for seated rest breaks. Cues for deep breathing techniques during. Pt will benefit from rollator due to decreased activity tolerance and functional demands that she needs to maintain independent lifestyle upon d/c. No further skilled PT needs identified at this time. PT will sign off.     If plan is discharge home, recommend the following:     Can travel by private vehicle        Equipment Recommendations Rollator (4 wheels)  Recommendations for Other Services       Functional Status Assessment Patient has had a recent decline in their functional status and demonstrates the ability to make significant improvements in function in a reasonable and predictable amount of time.     Precautions / Restrictions Precautions Precautions: None Restrictions Weight Bearing Restrictions Per Provider Order: No      Mobility  Bed Mobility Overal bed mobility: Modified Independent             General bed mobility comments: HOB and rails    Transfers Overall transfer level: Modified independent Equipment used: None Transfers: Sit to/from Stand Sit to Stand: Independent   Step pivot transfers: Independent       General transfer comment: STS x2     Ambulation/Gait Ambulation/Gait assistance: Modified independent (Device/Increase time) Gait Distance (Feet): 100 Feet Assistive device: Rollator (4 wheels) Gait Pattern/deviations: Step-through pattern Gait velocity: WFL     General Gait Details: 129ft x2 with seated rest breka on rollator betwee. educated on locking/unlocking rollator brakes at appropriate times. O2 maintained 94-97% throughout session on RA. Increased fatigue, SOB requiring seated rest break.  Stairs            Wheelchair Mobility     Tilt Bed    Modified Rankin (Stroke Patients Only)       Balance Overall balance assessment: No apparent balance deficits (not formally assessed)                                           Pertinent Vitals/Pain Pain Assessment Pain Assessment: No/denies pain    Home Living Family/patient expects to be discharged to:: Private residence Living Arrangements: Parent Available Help at Discharge: Family;Available 24 hours/day Type of Home: House Home Access: Ramped entrance       Home Layout: One level Home Equipment: Shower seat;Hand held shower head;Grab bars - tub/shower;BSC/3in1;Rolling Walker (2 wheels);Adaptive equipment      Prior Function Prior Level of Function : Independent/Modified Independent             Mobility Comments: occasionally use RW out in the community. fall about a month ago coming in from taking out trash I think my knees gave out on me ADLs Comments: drives,  completes groceries and IADL's and mother drives and gets MOW     Extremity/Trunk Assessment   Upper Extremity Assessment Upper Extremity Assessment: Overall WFL for tasks assessed    Lower Extremity Assessment Lower Extremity Assessment: Overall WFL for tasks assessed    Cervical / Trunk Assessment Cervical / Trunk Assessment: Normal  Communication   Communication Communication: No apparent difficulties    Cognition Arousal: Alert Behavior  During Therapy: WFL for tasks assessed/performed   PT - Cognitive impairments: No apparent impairments                         Following commands: Intact       Cueing Cueing Techniques: Verbal cues     General Comments General comments (skin integrity, edema, etc.): see write up for amb sats, cues for pacing without O2 on RA    Exercises     Assessment/Plan    PT Assessment Patient does not need any further PT services  PT Problem List         PT Treatment Interventions      PT Goals (Current goals can be found in the Care Plan section)  Acute Rehab PT Goals Patient Stated Goal: improve endurance and begin walking more for exercise upon d/c PT Goal Formulation: With patient Time For Goal Achievement: 04/07/24 Potential to Achieve Goals: Good    Frequency       Co-evaluation               AM-PAC PT 6 Clicks Mobility  Outcome Measure Help needed turning from your back to your side while in a flat bed without using bedrails?: None Help needed moving from lying on your back to sitting on the side of a flat bed without using bedrails?: None Help needed moving to and from a bed to a chair (including a wheelchair)?: None Help needed standing up from a chair using your arms (e.g., wheelchair or bedside chair)?: None Help needed to walk in hospital room?: None Help needed climbing 3-5 steps with a railing? : A Little 6 Click Score: 23    End of Session Equipment Utilized During Treatment: Gait belt Activity Tolerance: Patient tolerated treatment well Patient left: in bed;with call bell/phone within reach Nurse Communication: Mobility status PT Visit Diagnosis: Other abnormalities of gait and mobility (R26.89)    Time: 8691-8675 PT Time Calculation (min) (ACUTE ONLY): 16 min   Charges:   PT Evaluation $PT Eval Low Complexity: 1 Low   PT General Charges $$ ACUTE PT VISIT: 1 Visit         Tinnie BERRY PT, DPT  Acute Rehabilitation Services   Office [phone]  03/24/2024, 2:17 PM

## 2024-03-24 NOTE — Plan of Care (Signed)

## 2024-03-24 NOTE — Progress Notes (Signed)
 Krista [name] has been ambulating in the hall several times today,tolerates well.

## 2024-03-24 NOTE — TOC Initial Note (Addendum)
 Transition of Care [facility]) - Initial/Assessment Note    Patient Details  Name: Krista [name] MRN: [id number] Date of Birth: 08/01/1961  [facility]) CM/SW Contact:    Sheri ONEIDA Sharps, LCSW Phone Number: 03/24/2024, 10:45 AM  Clinical Narrative:                 Pt from home w/ family. Pt continues medical workup. Follow for O2 needs. Pt currently on 3.5L of O2. TOC following for dc needs.   PT recommended Rollator (4 wheels). DME ordered via [employer].   Barriers to Discharge: Continued Medical Work up   Patient Goals and CMS Choice Patient states their goals for this hospitalization and ongoing recovery are:: return home   Choice offered to / list presented to : NA      Expected Discharge Plan and Services In-house Referral: NA Discharge Planning Services: NA   Living arrangements for the past 2 months: Single Family Home                 DME Arranged: N/A DME Agency: NA       HH Arranged: NA HH Agency: NA        Prior Living Arrangements/Services Living arrangements for the past 2 months: Single Family Home Lives with:: Parents, Relatives Patient language and need for interpreter reviewed:: Yes Do you feel safe going back to the place where you live?: Yes      Need for Family Participation in Patient Care: Yes (Comment) Care giver support system in place?: Yes (comment)   Criminal Activity/Legal Involvement Pertinent to Current Situation/Hospitalization: No - Comment as needed  Activities of Daily Living   ADL Screening (condition at time of admission) Independently performs ADLs?: Yes (appropriate for developmental age) Is the patient deaf or have difficulty hearing?: No Does the patient have difficulty seeing, even when wearing glasses/contacts?: No Does the patient have difficulty concentrating, remembering, or making decisions?: No  Permission Sought/Granted                  Emotional Assessment Appearance:: Appears stated  age Attitude/Demeanor/Rapport: Engaged Affect (typically observed): Accepting Orientation: : Oriented to Self, Oriented to Place, Oriented to  Time, Oriented to Situation Alcohol / Substance Use: Not Applicable Psych Involvement: No (comment)  Admission diagnosis:  Bronchospasm [J98.01] Acute hypoxic respiratory failure ([name]) [J96.01] Patient Active Problem List   Diagnosis Date Noted   Acute hypoxic respiratory failure ([name]) 03/23/2024   Insomnia 02/14/2024   HFrEF (heart failure with reduced ejection fraction) ([name]) 01/23/2024   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) 01/03/2024   Cardiomyopathy ([name]) 05/09/2022   Peripheral neuropathy due to chemotherapy ([name]) 03/14/2022   Anemia due to antineoplastic chemotherapy 03/14/2022   Pancytopenia, acquired ([name]) 02/09/2022   Other constipation 02/09/2022   Generalized pain 01/28/2022   Rib fractures 01/28/2022   Metastatic disease ([name]) 01/28/2022   Endometrial cancer ([name]) 01/28/2022   Cough 01/28/2022   Elevated serum creatinine 01/28/2022   Protein-calorie malnutrition, moderate ([name]) 01/28/2022   Goals of care, counseling/discussion 01/28/2022   Elevated troponin 01/28/2022   Hydronephrosis of right kidney 01/28/2022   Hemoptysis 01/28/2022   Metastasis to lung ([name]) 01/28/2022   CKD (chronic kidney disease), stage III ([name]) 01/28/2022   Medical non-compliance 01/28/2022   Statin myopathy 11/05/2019   Dyspnea    Knee pain    Rhabdomyolysis due to statin therapy    Demand ischemia ([name])    Sepsis ([name]) 05/13/2019   NSTEMI (non-ST elevated myocardial infarction) (  [name]) 05/13/2019   Coronary artery disease 02/12/2019   Hyperlipidemia 02/12/2019   Unstable angina ([name])    Near syncope 12/14/2018   Chest tightness 12/14/2018   Prolonged Q-T interval on ECG    Acute gastroenteritis 08/02/2012   Dehydration 08/02/2012   AKI (acute kidney injury) ([name]) 08/02/2012   Hypokalemia 08/02/2012   Type 2 diabetes mellitus ([name])  08/02/2012   HTN (hypertension) 08/02/2012   Tobacco abuse 08/02/2012   Allergic rhinitis 10/21/2010   Disorder of bursae and tendons in shoulder region 10/21/2010   SEBACEOUS CYST, INFECTED 01/31/2010   GUAIAC POSITIVE STOOL 10/05/2009   RECTAL BLEEDING, HX OF 09/21/2009   Nonspecific (abnormal) findings on radiological and other examination of body structure 05/19/2009   RIGHT LOWER LUNG FIELD CRACKLES 05/19/2009   Diabetes ([name]) 02/12/2009   Pain in limb 02/12/2009   DENTAL CARIES 04/21/2008   Essential hypertension 05/01/2007   PCP:  Lang Dedra DASEN, MD Pharmacy:   [facility] [address] ([place]), [place] - [address] [place] ([place]) [place] [address] Phone: [phone] Fax: [phone]  [place] [facility] [address] [place] [address] Phone: [phone] Fax: [phone]     Social Drivers of Health (SDOH) Social History: SDOH Screenings   Food Insecurity: No Food Insecurity (03/24/2024)  Housing: High Risk (03/24/2024)  Transportation Needs: No Transportation Needs (03/24/2024)  Utilities: Not At Risk (03/24/2024)  Depression (PHQ2-9): Low Risk  (12/13/2020)  Financial Resource Strain: Not on File (12/01/2021)   [facility]  Physical Activity: Not on File (12/01/2021)   Received from [facility]  Social Connections: Not on File (04/28/2023)   Received from [facility]  Stress: Not on File (12/01/2021)   Received from [facility]  Tobacco Use: Medium Risk (03/23/2024)   SDOH Interventions:     Readmission Risk Interventions     No data to display

## 2024-03-24 NOTE — Progress Notes (Signed)
 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  She is a prior smoker for 20 years.  Her oxygen saturation was 80s on room air in the ER.  He was placed on 2 L of oxygen currently she is on 4 L to maintain her sats above 90%.  She was admitted for acute hypoxia due to COPD.  Assessment & Plan:   Principal Problem:   Acute hypoxic respiratory failure ([name])  Acute hypoxic respiratory failure secondary to presumed likely due to COPD exacerbation.  She was 80s on room air in the ED.  Chest x-ray shows no acute findings.  She was admitted with complaints of worsening shortness of breath and cough and dyspnea on exertion.  She was started on Rocephin  and doxycycline  and steroids and nebulizers.  Continue all.  Titrate oxygen down to DC as able.  Undiagnosed COPD with exacerbation.  Currently on 4 L of oxygen to maintain sats above 90%.  She did not have O2 prior to admission to hospital.  Her white count is normal.  D-dimer added to the labs from yesterday. Ambulatory O2 sats today   Endometrial cancer with metastasis to the lungs Followed by Dr. Lonn, hematology/oncology   Chronic diastolic CHF Last 2D echo done on 02/05/2024 revealed LVEF 60-65% Euvolemic on exam Monitor strict I's and O's and daily weight Restart metoprolol    Insomnia Resume home trazodone    Coronary artery disease Denies any anginal symptoms Resume home DAPT.  Estimated body mass index is 29.23 kg/m as calculated from the following:   Height as of this encounter: 5' 3 (1.6 m).   Weight as of this encounter: 74.8 kg.  DVT prophylaxis: Lovenox   code Status: DNR  family  Communication: None  disposition Plan:  Status is: Observation   Consultants:none  Procedures: none Antimicrobials:rocephin  doxy  Subjective: Sitting up by the side of bed Feels improved but not back to baseline   Objective: Vitals:   03/23/24 2341 03/24/24 0208 03/24/24 0346 03/24/24 0727  BP: (!) 156/95  (!) 153/82 (!) 154/93  Pulse: 63  76 68  Resp: 17  18 18   Temp: 98.4 F (36.9 C)  98 F (36.7 C) 98.7 F (37.1 C)  TempSrc:      SpO2: 93% 99% 96% 95%  Weight:      Height:        Intake/Output Summary (Last 24 hours) at 03/24/2024 0730 Last data filed at 03/24/2024 0400 Gross per 24 hour  Intake 590 ml  Output --  Net 590 ml   Filed Weights   03/23/24 2035  Weight: 74.8 kg    Examination:  General exam: Appears in nad Respiratory system: Clear to auscultation. Respiratory effort normal.wheezing Cardiovascular system: reg no tachy Gastrointestinal system: Abdomen is nondistended, soft and nontender. No organomegaly or masses felt. Normal bowel sounds heard. Central nervous system: Alert and oriented. No focal neurological deficits. Extremities:no edema  Data Reviewed: I have personally reviewed following labs and imaging studies  CBC: Recent Labs  Lab 03/23/24 2030 03/24/24 0538  WBC 6.6 6.8  NEUTROABS 3.1  --   HGB 12.6 12.7  HCT 40.6 39.8  MCV 90.6 89.6  PLT 229  214   Basic Metabolic Panel: Recent Labs  Lab 03/23/24 2030 03/24/24 0538  NA 140 135  K 3.6 4.0  CL 104 100  CO2 26 20*  GLUCOSE 116* 366*  BUN 32* 34*  CREATININE 1.38* 1.37*  CALCIUM  8.6* 8.4*  MG  --  2.0  PHOS  --  2.2*   GFR: Estimated Creatinine Clearance: 42.3 mL/min (A) (by C-G formula based on SCr of 1.37 mg/dL (H)). Liver Function Tests: Recent Labs  Lab 03/23/24 2030  AST 23  ALT 18  ALKPHOS 40  BILITOT 0.3  PROT 7.1  ALBUMIN  3.3*   No results for input(s): LIPASE, AMYLASE in the last 168 hours. No results for input(s): AMMONIA in the last 168  hours. Coagulation Profile: No results for input(s): INR, PROTIME in the last 168 hours. Cardiac Enzymes: No results for input(s): CKTOTAL, CKMB, CKMBINDEX, TROPONINI in the last 168 hours. BNP (last 3 results) No results for input(s): PROBNP in the last 8760 hours. HbA1C: No results for input(s): HGBA1C in the last 72 hours. CBG: No results for input(s): GLUCAP in the last 168 hours. Lipid Profile: No results for input(s): CHOL, HDL, LDLCALC, TRIG, CHOLHDL, LDLDIRECT in the last 72 hours. Thyroid  Function Tests: No results for input(s): TSH, T4TOTAL, FREET4, T3FREE, THYROIDAB in the last 72 hours. Anemia Panel: No results for input(s): VITAMINB12, FOLATE, FERRITIN, TIBC, IRON, RETICCTPCT in the last 72 hours. Sepsis Labs: No results for input(s): PROCALCITON, LATICACIDVEN in the last 168 hours.  Recent Results (from the past 240 hours)  Resp panel by RT-PCR (RSV, Flu A&B, Covid) Anterior Nasal Swab     Status: None   Collection Time: 03/23/24  9:20 PM   Specimen: Anterior Nasal Swab  Result Value Ref Range Status   SARS Coronavirus 2 by RT PCR NEGATIVE NEGATIVE Final    Comment: (NOTE) SARS-CoV-2 target nucleic acids are NOT DETECTED.  The SARS-CoV-2 RNA is generally detectable in upper respiratory specimens during the acute phase of infection. The lowest concentration of SARS-CoV-2 viral copies this assay can detect is 138 copies/mL. A negative result does not preclude SARS-Cov-2 infection and should not be used as the sole basis for treatment or other patient management decisions. A negative result may occur with  improper specimen collection/handling, submission of specimen other than nasopharyngeal swab, presence of viral mutation(s) within the areas targeted by this assay, and inadequate number of viral copies(<138 copies/mL). A negative result must be combined with clinical observations, patient history, and  epidemiological information. The expected result is Negative.  Fact Sheet for Patients:  BloggerCourse.com  Fact Sheet for Healthcare Providers:  SeriousBroker.it  This test is no t yet approved or cleared by the [place]  FDA and  has been authorized for detection and/or diagnosis of SARS-CoV-2 by FDA under an Emergency Use Authorization (EUA). This EUA will remain  in effect (meaning this test can be used) for the duration of the COVID-19 declaration under Section 564(b)(1) of the Act, 21 U.S.C.section 360bbb-3(b)(1), unless the authorization is terminated  or revoked sooner.       Influenza A by PCR NEGATIVE NEGATIVE Final   Influenza B by PCR NEGATIVE NEGATIVE Final    Comment: (NOTE) The Xpert Xpress SARS-CoV-2/FLU/RSV plus assay is intended as an aid in the diagnosis of influenza from Nasopharyngeal swab specimens and should not be used as a sole basis for treatment. Nasal washings and aspirates are unacceptable for Xpert Xpress SARS-CoV-2/FLU/RSV testing.  Fact Sheet for Patients: BloggerCourse.com  Fact Sheet  for Healthcare Providers: SeriousBroker.it  This test is not yet approved or cleared by the [place]  FDA and has been authorized for detection and/or diagnosis of SARS-CoV-2 by FDA under an Emergency Use Authorization (EUA). This EUA will remain in effect (meaning this test can be used) for the duration of the COVID-19 declaration under Section 564(b)(1) of the Act, 21 U.S.C. section 360bbb-3(b)(1), unless the authorization is terminated or revoked.     Resp Syncytial Virus by PCR NEGATIVE NEGATIVE Final    Comment: (NOTE) Fact Sheet for Patients: BloggerCourse.com  Fact Sheet for Healthcare Providers: SeriousBroker.it  This test is not yet approved or cleared by the [place]  FDA and has been  authorized for detection and/or diagnosis of SARS-CoV-2 by FDA under an Emergency Use Authorization (EUA). This EUA will remain in effect (meaning this test can be used) for the duration of the COVID-19 declaration under Section 564(b)(1) of the Act, 21 U.S.C. section 360bbb-3(b)(1), unless the authorization is terminated or revoked.  Performed at [facility], [address], [place], [place] [address]          Radiology Studies: DG Chest Portable 1 View Result Date: 03/23/2024 CLINICAL DATA:  shob EXAM: PORTABLE CHEST 1 VIEW COMPARISON:  CT chest 03/20/2024 FINDINGS: Right chest wall Port-A-Cath with tip overlying the superior caval junction. The heart and mediastinal contours are within normal limits. Atherosclerotic plaque. Emphysematous changes. No focal consolidation. No pulmonary edema. No pleural effusion. No pneumothorax. No acute osseous abnormality. IMPRESSION: 1. No active cardiopulmonary disease. 2. Aortic Atherosclerosis (ICD10-I70.0) and Emphysema (ICD10-J43.9). Electronically Signed   By: Morgane  Naveau M.D.   On: 03/23/2024 21:12    Scheduled Meds:  aspirin  EC  81 mg Oral Daily   clopidogrel   75 mg Oral Daily   doxazosin   4 mg Oral Daily   enoxaparin  (LOVENOX ) injection  40 mg Subcutaneous Q24H   ipratropium-albuterol   3 mL Nebulization Q6H   loratadine   10 mg Oral QPM   methylPREDNISolone  (SOLU-MEDROL ) injection  40 mg Intravenous Daily   traZODone   50 mg Oral QHS   Continuous Infusions:  cefTRIAXone  (ROCEPHIN )  IV Stopped (03/24/24 0200)   doxycycline  (VIBRAMYCIN ) IV Stopped (03/24/24 0400)     LOS: 0 days   Almarie KANDICE Hoots, MD  03/24/2024, 7:30 AM

## 2024-03-24 NOTE — Evaluation (Signed)
 Occupational Therapy Evaluation Patient Details Name: Krista [name] MRN: [id number] DOB: 05-29-1961 Today's Date: 03/24/2024   History of Present Illness   Krista [name] is a 63 y.o. 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     Clinical Impressions PTA, patient lives at home with elderly mother who both support each other. Patient amb in community with RW but able to manage BADL's and mobility in home at mod I.  Currently, patient presents with deficits outlined below (see OT Problem List for details) most significantly decreased activity tolerance for BADL's and mobility. See below for O2 response. OT recommending rollator for discharge home but no follow up OT services anticipated.  Patient requires continued Acute care hospital level OT services to progress safety and functional performance and allow for discharge.    OT Sats with activity and amb:  At start of session pt was off O2 bed level thus OT assessed patient on RA with 94% SaO2 bed level.   SaO2 on room air at rest = 94%  SaO2 on room air while completing sitting and standing level sink side ADL's 20 minutes= 96%  SaO2 on room air while ambulating 250 ft = 96%; HR 88, RR 22  SaO2 on room air at rest = 94% back in room, HR 76   If plan is discharge home, recommend the following:   Assist for transportation     Functional Status Assessment   Patient has had a recent decline in their functional status and demonstrates the ability to make significant improvements in function in a reasonable and predictable amount of time.     Equipment Recommendations   Other (comment) (rollator)      Precautions/Restrictions    Precautions Precautions: None Restrictions Weight Bearing Restrictions Per Provider Order: No     Mobility Bed Mobility Overal bed mobility: Modified Independent             General bed mobility comments: HOB and rails    Transfers Overall transfer level: Needs assistance Equipment used: None Transfers: Sit to/from Stand, Bed to chair/wheelchair/BSC Sit to Stand: Supervision     Step pivot transfers: Supervision     General transfer comment: amb hallway without AD but sat ant end of hall briefly, would beneifit from rollator      Balance Overall balance assessment: No apparent balance deficits (not formally assessed)                                         ADL either performed or assessed with clinical judgement   ADL Overall ADL's : Needs assistance/impaired Eating/Feeding: Independent   Grooming: Wash/dry hands;Wash/dry face;Oral care;Applying deodorant;Sitting;Modified independent   Upper Body Bathing: Modified independent;Sitting   Lower Body Bathing: Supervison/ safety;Sit to/from stand   Upper Body Dressing : Modified independent;Sitting   Lower Body Dressing: Supervision/safety;Sit to/from Database administrator: Supervision/safety;Regular Social worker and Hygiene: Supervision/safety;Sit to/from stand       Functional mobility during ADLs: Supervision/safety General ADL Comments: cues for pacing and breathing     Vision Baseline Vision/History: 1 Wears glasses;0 No visual deficits  Pertinent Vitals/Pain Pain Assessment Pain Assessment: No/denies pain     Extremity/Trunk Assessment Upper Extremity Assessment Upper Extremity Assessment: Right hand dominant;Overall [facility] for tasks assessed   Lower Extremity Assessment Lower Extremity Assessment: Overall WFL for tasks assessed   Cervical / Trunk Assessment Cervical / Trunk Assessment: Normal   Communication  Communication Communication: No apparent difficulties   Cognition Arousal: Alert Behavior During Therapy: WFL for tasks assessed/performed Cognition: No apparent impairments                               Following commands: Intact       Cueing  General Comments   Cueing Techniques: Verbal cues  see write up for amb sats, cues for pacing without O2 on RA    Home Living Family/patient expects to be discharged to:: Private residence Living Arrangements: Parent Available Help at Discharge: Family;Available 24 hours/day Type of Home: House Home Access: Ramped entrance     Home Layout: One level     Bathroom Shower/Tub: Tub/shower unit;Curtain   Firefighter: Standard Bathroom Accessibility: Yes How Accessible: Accessible via walker Home Equipment: Shower seat;Hand held shower head;Grab bars - tub/shower;BSC/3in1;Rolling Walker (2 wheels);Adaptive equipment Adaptive Equipment: Reacher        Prior Functioning/Environment Prior Level of Function : Independent/Modified Independent             Mobility Comments: occasionally use RW out in the community ADLs Comments: drives, completes groceries and IADL's and mother drives and gets MOW    OT Problem List: Decreased activity tolerance;Cardiopulmonary status limiting activity   OT Treatment/Interventions: Self-care/ADL training;Therapeutic exercise;Neuromuscular education;Energy conservation;DME and/or AE instruction;Therapeutic activities;Patient/family education      OT Goals(Current goals can be found in the care plan section)   Acute Rehab OT Goals Patient Stated Goal: to go home to my mom OT Goal Formulation: With patient Time For Goal Achievement: 04/07/24 Potential to Achieve Goals: Good ADL Goals Pt Will Perform Lower Body Bathing: with modified independence;sit to/from stand Pt Will Perform Lower Body Dressing: with modified independence;sit to/from stand Pt Will Transfer to Toilet:  with modified independence;ambulating Pt Will Perform Tub/Shower Transfer: Tub transfer;ambulating;shower seat Pt/caregiver will Perform Home Exercise Program: Independently;With written HEP provided Additional ADL Goal #1: Patient will teach back and integrate 5 5P's indep incl use of rollator and HEP   OT Frequency:  Min 2X/week       AM-PAC OT 6 Clicks Daily Activity     Outcome Measure Help from another person eating meals?: None Help from another person taking care of personal grooming?: None Help from another person toileting, which includes using toliet, bedpan, or urinal?: None Help from another person bathing (including washing, rinsing, drying)?: A Little Help from another person to put on and taking off regular upper body clothing?: None Help from another person to put on and taking off regular lower body clothing?: A Little 6 Click Score: 22   End of Session Equipment Utilized During Treatment: Gait belt Nurse Communication: Mobility status;Other (comment) (replaced O2 after session)  Activity Tolerance: Patient tolerated treatment well Patient left: in bed;with call bell/phone within reach;with bed alarm set  OT Visit Diagnosis: Unsteadiness on feet (R26.81)                Time: 1021-1101 OT Time Calculation (min): 40 min Charges:  OT General Charges $OT Visit: 1 Visit OT Evaluation $OT Eval Low Complexity: 1 Low OT Treatments $Self Care/Home Management :  23-37 mins  Kaiden Dardis OT/L Acute Rehabilitation Department  [phone]  03/24/2024, 11:56 AM

## 2024-03-24 NOTE — Care Management Obs Status (Signed)
 MEDICARE OBSERVATION STATUS NOTIFICATION   Patient Details  Name: Krista [name] MRN: [id number] Date of Birth: 1961-03-18   Medicare Observation Status Notification Given:  Yes    Sheri ONEIDA Sharps, LCSW 03/24/2024, 2:50 PM

## 2024-03-25 DIAGNOSIS — J9621 Acute and chronic respiratory failure with hypoxia: Secondary | ICD-10-CM | POA: Diagnosis not present

## 2024-03-25 DIAGNOSIS — J9601 Acute respiratory failure with hypoxia: Secondary | ICD-10-CM | POA: Diagnosis not present

## 2024-03-25 LAB — COMPREHENSIVE METABOLIC PANEL WITH GFR
ALT: 19 U/L (ref 0–44)
AST: 21 U/L (ref 15–41)
Albumin: 3.2 g/dL — ABNORMAL LOW (ref 3.5–5.0)
Alkaline Phosphatase: 41 U/L (ref 38–126)
Anion gap: 9 (ref 5–15)
BUN: 42 mg/dL — ABNORMAL HIGH (ref 6–20)
CO2: 23 mmol/L (ref 22–32)
Calcium: 8.9 mg/dL (ref 8.9–10.3)
Chloride: 105 mmol/L (ref 98–111)
Creatinine, Ser: 1.17 mg/dL — ABNORMAL HIGH (ref 0.44–1.00)
GFR, Estimated: 53 mL/min — ABNORMAL LOW (ref >60)
Glucose, Bld: 136 mg/dL — ABNORMAL HIGH (ref 70–99)
Potassium: 3.7 mmol/L (ref 3.5–5.1)
Sodium: 137 mmol/L (ref 135–145)
Total Bilirubin: 0.4 mg/dL (ref 0.0–1.2)
Total Protein: 6.8 g/dL (ref 6.5–8.1)

## 2024-03-25 LAB — HEMOGLOBIN A1C
Hgb A1c MFr Bld: 6.6 % — ABNORMAL HIGH (ref 4.8–5.6)
Mean Plasma Glucose: 143 mg/dL

## 2024-03-25 LAB — CBC
HCT: 38.4 % (ref 36.0–46.0)
Hemoglobin: 12.1 g/dL (ref 12.0–15.0)
MCH: 27.9 pg (ref 26.0–34.0)
MCHC: 31.5 g/dL (ref 30.0–36.0)
MCV: 88.5 fL (ref 80.0–100.0)
Platelets: 216 K/uL (ref 150–400)
RBC: 4.34 MIL/uL (ref 3.87–5.11)
RDW: 16.1 % — ABNORMAL HIGH (ref 11.5–15.5)
WBC: 11.9 K/uL — ABNORMAL HIGH (ref 4.0–10.5)
nRBC: 0 % (ref 0.0–0.2)

## 2024-03-25 LAB — GLUCOSE, CAPILLARY: Glucose-Capillary: 103 mg/dL — ABNORMAL HIGH (ref 70–99)

## 2024-03-25 MED ORDER — HYDRALAZINE HCL 50 MG PO TABS: 50.0000 mg | ORAL_TABLET | Freq: Three times a day (TID) | ORAL | 6 refills | Status: DC

## 2024-03-25 MED ORDER — PREDNISONE 10 MG PO TABS: ORAL_TABLET | ORAL | 0 refills | Status: DC

## 2024-03-25 NOTE — Plan of Care (Signed)

## 2024-03-25 NOTE — Progress Notes (Addendum)
 AVS provided and reviewed with patient who denied questions at this time. [employer] notified to deliver 4 wheel walker & patient made aware.   Addendum: patient stated she would call [employer] to request delivery at home as she did not wish to wait in the hospital any longer. This RN provided [employer]'s number and will attempt to notify company as well that patient has discharged.

## 2024-03-25 NOTE — TOC Transition Note (Signed)
 Transition of Care [facility]) - Discharge Note   Patient Details  Name: Krista [name] MRN: [id number] Date of Birth: [birth year]  [facility] of Care [facility] CM/SW Contact:  Toy LITTIE Agar, RN Phone Number:[phone]  03/25/2024, 10:04 AM   Clinical Narrative:    Patient with discharge orders. Nurse reports that patient is waiting for rolling walker from [employer]. CM called [employer] and spoke with [facility] who states that walker is on the way to the hospital.   1000 CM received message from nurse stating that patient does not want to wait for walker and has left facility stating that she will call [employer]. CM called [employer] to make them aware of need for home delivery.    Barriers to Discharge: Continued Medical Work up   Patient Goals and CMS Choice Patient states their goals for this hospitalization and ongoing recovery are:: return home   Choice offered to / list presented to : NA      Discharge Placement                       Discharge Plan and Services Additional resources added to the After Visit Summary for   In-house Referral: NA Discharge Planning Services: NA            DME Arranged: N/A DME Agency: NA       HH Arranged: NA HH Agency: NA        Social Drivers of Health (SDOH) Interventions SDOH Screenings   Food Insecurity: No Food Insecurity (03/24/2024)  Housing: High Risk (03/24/2024)  Transportation Needs: No Transportation Needs (03/24/2024)  Utilities: Not At Risk (03/24/2024)  Depression (PHQ2-9): Low Risk  (12/13/2020)  Financial Resource Strain: Not on File (12/01/2021)   Received from [facility]  Physical Activity: Not on File (12/01/2021)   Received from [facility]  Social Connections: Not on File (04/28/2023)   Received from [facility]  Stress: Not on File (12/01/2021)   Received from [facility]  Tobacco Use: Medium Risk (03/23/2024)     Readmission Risk Interventions     No data to display

## 2024-03-26 NOTE — Discharge Summary (Signed)
 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  She is a prior smoker for 20 years.  Her oxygen saturation was 80s on room air in the ER.  He was placed on 2 L of oxygen currently she is on 4 L to maintain her sats above 90%.  She was admitted for acute hypoxia due to COPD.   Discharge Diagnoses:  Principal Problem:   Acute hypoxic respiratory failure ([name])     Acute hypoxic respiratory failure secondary to due to COPD exacerbation.  She was 80s on room air in the ED.  Chest x-ray shows no acute findings.  She was admitted with complaints of worsening shortness of breath and cough and dyspnea on exertion.  She was started on Rocephin  and doxycycline  and steroids and nebulizers.  She was treated with Rocephin  and doxycycline  and nebulizer treatments with improvement in her oxygen saturation and symptoms.  On the day of discharge she was on room air and was able to maintain her saturations.  Discharged on prednisone .  Endometrial cancer with metastasis to the lungs Followed by Dr. Lonn, hematology/oncology   Chronic diastolic CHF Last 2D echo done on 02/05/2024 revealed LVEF 60-65% Euvolemic on exam Continue metoprolol .  Insomnia Resume home trazodone    Coronary artery disease Denies any  anginal symptoms Resume home DAPT.   Estimated body mass index is 31.87 kg/m as calculated from the following:   Height as of this encounter: 5' 3 (1.6 m).   Weight as of this encounter: 81.6 kg.  Discharge Instructions  Discharge Instructions     Diet - low sodium heart healthy   Complete by: As directed    Increase activity slowly   Complete by: As directed       Allergies as of 03/25/2024       Reactions   Statins Itching   itching itching   Agrostis Alba Pollen Extract [gramineae Pollens]    Zetia [ezetimibe] Itching        Medication List     STOP taking these medications    hydrochlorothiazide  25 MG tablet Commonly known as: HYDRODIURIL    metFORMIN  500 MG tablet Commonly known as: GLUCOPHAGE    Repatha  SureClick 140 MG/ML Soaj Generic drug: Evolocumab        TAKE these medications    acetaminophen  500 MG tablet Commonly known as: TYLENOL  Take 1,000 mg by mouth every 6 (six) hours as needed for mild pain or headache.   aspirin  EC 81 MG tablet Take 1 tablet (81 mg total) by mouth daily. Swallow whole.   clopidogrel  75 MG tablet Commonly known as: PLAVIX  Take 75 mg by mouth daily.   dapagliflozin  propanediol 10 MG Tabs tablet Commonly known as: Farxiga  Take 1 tablet (10 mg total) by mouth daily before breakfast.   doxazosin  4 MG tablet Commonly known as: Cardura  Take 1 tablet (4 mg total) by mouth daily.   hydrALAZINE   50 MG tablet Commonly known as: APRESOLINE  Take 1 tablet (50 mg total) by mouth 3 (three) times daily.   levocetirizine 5 MG tablet Commonly known as: XYZAL Take 5 mg by mouth every evening.   lidocaine -prilocaine  cream Commonly known as: EMLA  Apply 1 application topically as needed.   losartan  100 MG tablet Commonly known as: COZAAR  Take 1 tablet by mouth once daily   metoprolol  succinate 100 MG 24 hr tablet Commonly known as: Toprol  XL Take 1 tablet (100 mg total) by mouth daily.   nitroGLYCERIN  0.4 MG SL  tablet Commonly known as: NITROSTAT  Place 1 tablet (0.4 mg total) under the tongue every 5 (five) minutes as needed for chest pain.   polyethylene glycol 17 g packet Commonly known as: MiraLax  Take 17 g by mouth daily. What changed:  when to take this reasons to take this   predniSONE  10 MG tablet Commonly known as: DELTASONE  Take 30 mg daily for 5 days and then stop   traZODone  50 MG tablet Commonly known as: DESYREL  Take 1 tablet (50 mg total) by mouth at bedtime.        Follow-up Information     [facility] Follow up.   Why: [employer] provided your Rollator (4 wheels) Contact information: [address] [place] [address] [phone]         Lang Dedra DASEN, MD Follow up.   Specialty: Family Medicine Contact information: [address] [place] [address] [phone]         Lonn Hicks, MD Follow up.   Specialty: Hematology and Oncology Contact information: [address] [place] [address] [phone]                Allergies  Allergen Reactions   Statins Itching    itching itching   Agrostis Alba Pollen Extract [Gramineae Pollens]    Zetia [Ezetimibe] Itching    Consultations: None   Procedures/Studies: CT CHEST ABDOMEN PELVIS WO CONTRAST Result Date: 03/23/2024 CLINICAL DATA:  Metastatic endometrial cancer. Assess treatment response to chemotherapy. Stage 3 chronic kidney disease. Contrast withheld. EXAM: CT CHEST, ABDOMEN AND PELVIS WITHOUT CONTRAST TECHNIQUE: Multidetector CT imaging of the chest, abdomen and pelvis was performed following the standard protocol without IV contrast. RADIATION DOSE REDUCTION: This exam was performed according to the departmental dose-optimization program which includes automated exposure control, adjustment of the mA and/or kV according to patient size and/or use of iterative reconstruction technique. COMPARISON:  CTs of the chest, abdomen and pelvis with  contrast 11/21/2023, 07/03/2023, and 01/18/2023. FINDINGS: CT CHEST FINDINGS Cardiovascular: Right chest port. IJ approach catheter terminates in the mid SVC. The cardiac size is normal. There are three-vessel coronary calcifications heaviest in the LAD. Minimal pericardial fluid is again noted anteriorly. The aorta is tortuous with patchy calcific plaques, scattered calcification in the great vessels. There is no aortic aneurysm. The pulmonary arteries and veins are normal in caliber. Mediastinum/Nodes: No adenopathy is seen without contrast. Stable borderline precarinal lymph node 7 mm in short axis. Axillary regions are clear. Lungs/Pleura: Diffuse bronchial thickening. The lungs are mildly emphysematous with centrilobular changes predominating, small amount of paraseptal emphysema in the apices. Stable lateral right apical 6 mm spiculated nodule since the last CT but was not seen on the older studies. There are scattered linear scar-like opacities both bases. Occasional subsegmental bronchial plugging in both lower lobes. Single bronchial impaction in the suprahilar left upper lobe. No infiltrate, effusion or other significant findings. No other nodules. Musculoskeletal:  Thoracic spondylosis and slight dextroscoliosis. No regional metastatic skeletal lesion. CT ABDOMEN PELVIS FINDINGS Hepatobiliary: No focal liver abnormality is seen without contrast. No gallstones, gallbladder wall thickening, or biliary dilatation. Pancreas: Unremarkable without contrast. No pancreatic ductal dilatation or surrounding inflammatory changes. Spleen: Unremarkable without contrast. Adrenals/Urinary Tract: Chronic asymmetric atrophy right kidney. No adrenal mass. No contour deforming renal abnormality bilaterally. There is no urinary stone or obstruction.  Bladder is unremarkable. Stomach/Bowel: Small hiatal hernia. Unremarkable gastric wall. Normal caliber unopacified small bowel. Normal appendix. Moderate retained stool ascending  and transverse colon. Sigmoid diverticulosis without evidence of diverticulitis or colitis. Vascular/Lymphatic: Heavy aortoiliac calcific disease. No AAA. Stable 1.9 cm aneurysm proximal right common iliac artery. No lymphadenopathy is seen without contrast.  Pelvic phleboliths. Reproductive: Status post hysterectomy. No adnexal masses. Other: Small umbilical and left inguinal fat hernias. No incarcerated hernia. No free fluid, free hemorrhage, free air or nodular peritoneal disease. Musculoskeletal: Advanced facet hypertrophy lower lumbar spine. Grade 1 degenerative L5-S1 anterolisthesis. osteopenia. No destructive bone lesion. Slight lumbar levoscoliosis. IMPRESSION: 1. Stable 6 mm spiculated right apical nodule since 11/21/2023, but was not seen on the older studies. 2. No other evidence of metastatic disease in the chest, abdomen or pelvis without contrast. 3. Emphysema and bronchitis.  Occasional bronchial impactions. 4. Aortic and coronary artery atherosclerosis. 5. Small hiatal hernia. 6. Constipation and diverticulosis. 7. 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 8. Umbilical and left inguinal fat hernias. 9. Osteopenia and degenerative change. Aortic Atherosclerosis (ICD10-I70.0) and Emphysema (ICD10-J43.9). Electronically Signed   By: Francis Quam M.D.   On: 03/23/2024 23:41   DG Chest Portable 1 View Result Date: 03/23/2024 CLINICAL DATA:  shob EXAM: PORTABLE CHEST 1 VIEW COMPARISON:  CT chest 03/20/2024 FINDINGS: Right chest wall Port-A-Cath with tip overlying the superior caval junction. The heart and mediastinal contours are within normal limits. Atherosclerotic plaque. Emphysematous changes. No focal consolidation. No pulmonary edema. No pleural effusion. No pneumothorax. No acute osseous abnormality. IMPRESSION: 1. No active cardiopulmonary disease. 2. Aortic Atherosclerosis (ICD10-I70.0) and Emphysema (ICD10-J43.9). Electronically Signed   By: Morgane  Naveau M.D.   On:  03/23/2024 21:12   (Echo, Carotid, EGD, Colonoscopy, ERCP)    Subjective: Anxious to go home  Discharge Exam: Vitals:   03/25/24 0803 03/25/24 0814  BP:    Pulse:  79  Resp:    Temp:    SpO2: 94% 96%   Vitals:   03/25/24 0536 03/25/24 0554 03/25/24 0803 03/25/24 0814  BP: (!) 145/91 (!) 145/91    Pulse: (!) 59   79  Resp: 19     Temp: 97.8 F (36.6 C)     TempSrc: Oral     SpO2: 93%  94% 96%  Weight: 81.6 kg     Height:        General: Pt is alert, awake, not in acute distress Cardiovascular: RRR, S1/S2 +, no rubs, no gallops Respiratory: CTA bilaterally, no wheezing, no rhonchi Abdominal: Soft, NT, ND, bowel sounds + Extremities: no edema, no cyanosis    The results of significant diagnostics from this hospitalization (including imaging, microbiology, ancillary and laboratory) are listed below for reference.     Microbiology: Recent Results (from the past 240 hours)  Resp panel by RT-PCR (RSV, Flu A&B, Covid) Anterior Nasal Swab     Status: None   Collection Time: 03/23/24  9:20 PM   Specimen: Anterior Nasal Swab  Result Value Ref Range Status   SARS Coronavirus 2 by RT PCR  NEGATIVE NEGATIVE Final    Comment: (NOTE) SARS-CoV-2 target nucleic acids are NOT DETECTED.  The SARS-CoV-2 RNA is generally detectable in upper respiratory specimens during the acute phase of infection. The lowest concentration of SARS-CoV-2 viral copies this assay can detect is 138 copies/mL. A negative result does not preclude SARS-Cov-2 infection and should not be used as the sole basis for treatment or other patient management decisions. A negative result may occur with  improper specimen collection/handling, submission of specimen other than nasopharyngeal swab, presence of viral mutation(s) within the areas targeted by this assay, and inadequate number of viral copies(<138 copies/mL). A negative result must be combined with clinical observations, patient history, and  epidemiological information. The expected result is Negative.  Fact Sheet for Patients:  BloggerCourse.com  Fact Sheet for Healthcare Providers:  SeriousBroker.it  This test is no t yet approved or cleared by the [place]  FDA and  has been authorized for detection and/or diagnosis of SARS-CoV-2 by FDA under an Emergency Use Authorization (EUA). This EUA will remain  in effect (meaning this test can be used) for the duration of the COVID-19 declaration under Section 564(b)(1) of the Act, 21 U.S.C.section 360bbb-3(b)(1), unless the authorization is terminated  or revoked sooner.       Influenza A by PCR NEGATIVE NEGATIVE Final   Influenza B by PCR NEGATIVE NEGATIVE Final    Comment: (NOTE) The Xpert Xpress SARS-CoV-2/FLU/RSV plus assay is intended as an aid in the diagnosis of influenza from Nasopharyngeal swab specimens and should not be used as a sole basis for treatment. Nasal washings and aspirates are unacceptable for Xpert Xpress SARS-CoV-2/FLU/RSV testing.  Fact Sheet for Patients: BloggerCourse.com  Fact Sheet for Healthcare Providers: SeriousBroker.it  This test is not yet approved or cleared by the [place]  FDA and has been authorized for detection and/or diagnosis of SARS-CoV-2 by FDA under an Emergency Use Authorization (EUA). This EUA will remain in effect (meaning this test can be used) for the duration of the COVID-19 declaration under Section 564(b)(1) of the Act, 21 U.S.C. section 360bbb-3(b)(1), unless the authorization is terminated or revoked.     Resp Syncytial Virus by PCR NEGATIVE NEGATIVE Final    Comment: (NOTE) Fact Sheet for Patients: BloggerCourse.com  Fact Sheet for Healthcare Providers: SeriousBroker.it  This test is not yet approved or cleared by the [place]  FDA and has been  authorized for detection and/or diagnosis of SARS-CoV-2 by FDA under an Emergency Use Authorization (EUA). This EUA will remain in effect (meaning this test can be used) for the duration of the COVID-19 declaration under Section 564(b)(1) of the Act, 21 U.S.C. section 360bbb-3(b)(1), unless the authorization is terminated or revoked.  Performed at [facility], [address] [place], [place] [address]      Labs: BNP (last 3 results) Recent Labs    01/23/24 1234 02/20/24 1240 03/23/24 2030  BNP 112.4* 75.1 56.3   Basic Metabolic Panel: Recent Labs  Lab 03/23/24 2030 03/24/24 0538 03/25/24 0510  NA 140 135 137  K 3.6 4.0 3.7  CL 104 100 105  CO2 26 20* 23  GLUCOSE 116* 366* 136*  BUN 32* 34* 42*  CREATININE 1.38* 1.37* 1.17*  CALCIUM  8.6* 8.4* 8.9  MG  --  2.0  --   PHOS  --  2.2*  --    Liver Function Tests: Recent Labs  Lab 03/23/24 2030 03/25/24 0510  AST 23 21  ALT 18 19  ALKPHOS 40 41  BILITOT  0.3 0.4  PROT 7.1 6.8  ALBUMIN  3.3* 3.2*   No results for input(s): LIPASE, AMYLASE in the last 168 hours. No results for input(s): AMMONIA in the last 168 hours. CBC: Recent Labs  Lab 03/23/24 2030 03/24/24 0538 03/25/24 0510  WBC 6.6 6.8 11.9*  NEUTROABS 3.1  --   --   HGB 12.6 12.7 12.1  HCT 40.6 39.8 38.4  MCV 90.6 89.6 88.5  PLT 229 214 216   Cardiac Enzymes: No results for input(s): CKTOTAL, CKMB, CKMBINDEX, TROPONINI in the last 168 hours. BNP: Invalid input(s): POCBNP CBG: Recent Labs  Lab 03/24/24 1202 03/24/24 1636 03/24/24 2116 03/25/24 0711  GLUCAP 205* 210* 161* 103*   D-Dimer Recent Labs    03/24/24 0843  DDIMER 0.77*   Hgb A1c Recent Labs    03/24/24 0538  HGBA1C 6.6*   Lipid Profile No results for input(s): CHOL, HDL, LDLCALC, TRIG, CHOLHDL, LDLDIRECT in the last 72 hours. Thyroid  function studies No results for input(s): TSH, T4TOTAL, T3FREE, THYROIDAB in the  last 72 hours.  Invalid input(s): FREET3 Anemia work up No results for input(s): VITAMINB12, FOLATE, FERRITIN, TIBC, IRON, RETICCTPCT in the last 72 hours. Urinalysis    Component Value Date/Time   COLORURINE YELLOW 09/22/2020 1859   APPEARANCEUR CLEAR 09/22/2020 1859   LABSPEC <1.005 (L) 09/22/2020 1859   PHURINE 6.0 09/22/2020 1859   GLUCOSEU >=500 (A) 09/22/2020 1859   HGBUR NEGATIVE 09/22/2020 1859   HGBUR negative 09/21/2009 1036   BILIRUBINUR NEGATIVE 09/22/2020 1859   KETONESUR NEGATIVE 09/22/2020 1859   PROTEINUR NEGATIVE 09/22/2020 1859   UROBILINOGEN 0.2 09/21/2009 1036   NITRITE NEGATIVE 09/22/2020 1859   LEUKOCYTESUR NEGATIVE 09/22/2020 1859   Sepsis Labs Recent Labs  Lab 03/23/24 2030 03/24/24 0538 03/25/24 0510  WBC 6.6 6.8 11.9*   Microbiology Recent Results (from the past 240 hours)  Resp panel by RT-PCR (RSV, Flu A&B, Covid) Anterior Nasal Swab     Status: None   Collection Time: 03/23/24  9:20 PM   Specimen: Anterior Nasal Swab  Result Value Ref Range Status   SARS Coronavirus 2 by RT PCR NEGATIVE NEGATIVE Final    Comment: (NOTE) SARS-CoV-2 target nucleic acids are NOT DETECTED.  The SARS-CoV-2 RNA is generally detectable in upper respiratory specimens during the acute phase of infection. The lowest concentration of SARS-CoV-2 viral copies this assay can detect is 138 copies/mL. A negative result does not preclude SARS-Cov-2 infection and should not be used as the sole basis for treatment or other patient management decisions. A negative result may occur with  improper specimen collection/handling, submission of specimen other than nasopharyngeal swab, presence of viral mutation(s) within the areas targeted by this assay, and inadequate number of viral copies(<138 copies/mL). A negative result must be combined with clinical observations, patient history, and epidemiological information. The expected result is Negative.  Fact Sheet  for Patients:  BloggerCourse.com  Fact Sheet for Healthcare Providers:  SeriousBroker.it  This test is no t yet approved or cleared by the [place]  FDA and  has been authorized for detection and/or diagnosis of SARS-CoV-2 by FDA under an Emergency Use Authorization (EUA). This EUA will remain  in effect (meaning this test can be used) for the duration of the COVID-19 declaration under Section 564(b)(1) of the Act, 21 U.S.C.section 360bbb-3(b)(1), unless the authorization is terminated  or revoked sooner.       Influenza A by PCR NEGATIVE NEGATIVE Final   Influenza B by PCR NEGATIVE NEGATIVE Final  Comment: (NOTE) The Xpert Xpress SARS-CoV-2/FLU/RSV plus assay is intended as an aid in the diagnosis of influenza from Nasopharyngeal swab specimens and should not be used as a sole basis for treatment. Nasal washings and aspirates are unacceptable for Xpert Xpress SARS-CoV-2/FLU/RSV testing.  Fact Sheet for Patients: BloggerCourse.com  Fact Sheet for Healthcare Providers: SeriousBroker.it  This test is not yet approved or cleared by the [place]  FDA and has been authorized for detection and/or diagnosis of SARS-CoV-2 by FDA under an Emergency Use Authorization (EUA). This EUA will remain in effect (meaning this test can be used) for the duration of the COVID-19 declaration under Section 564(b)(1) of the Act, 21 U.S.C. section 360bbb-3(b)(1), unless the authorization is terminated or revoked.     Resp Syncytial Virus by PCR NEGATIVE NEGATIVE Final    Comment: (NOTE) Fact Sheet for Patients: BloggerCourse.com  Fact Sheet for Healthcare Providers: SeriousBroker.it  This test is not yet approved or cleared by the [place]  FDA and has been authorized for detection and/or diagnosis of SARS-CoV-2 by FDA under an  Emergency Use Authorization (EUA). This EUA will remain in effect (meaning this test can be used) for the duration of the COVID-19 declaration under Section 564(b)(1) of the Act, 21 U.S.C. section 360bbb-3(b)(1), unless the authorization is terminated or revoked.  Performed at [facility], [address] [place], [place] [address]      Time coordinating discharge: 39 min SIGNED:   Almarie KANDICE Hoots, MD  [facility] 03/26/2024, 4:34 PM

## 2024-03-27 ENCOUNTER — Inpatient Hospital Stay

## 2024-03-27 ENCOUNTER — Inpatient Hospital Stay: Attending: Hematology and Oncology

## 2024-03-27 ENCOUNTER — Inpatient Hospital Stay (HOSPITAL_BASED_OUTPATIENT_CLINIC_OR_DEPARTMENT_OTHER): Admitting: Hematology and Oncology

## 2024-03-27 ENCOUNTER — Encounter: Payer: Self-pay | Admitting: Hematology and Oncology

## 2024-03-27 VITALS — BP 112/62 | HR 78 | Temp 98.2°F | Resp 18 | Ht 63.0 in | Wt 174.4 lb

## 2024-03-27 VITALS — BP 124/79 | HR 71 | Temp 97.7°F | Resp 18

## 2024-03-27 DIAGNOSIS — Z79620 Long term (current) use of immunosuppressive biologic: Secondary | ICD-10-CM | POA: Insufficient documentation

## 2024-03-27 DIAGNOSIS — I10 Essential (primary) hypertension: Secondary | ICD-10-CM | POA: Diagnosis not present

## 2024-03-27 DIAGNOSIS — C7801 Secondary malignant neoplasm of right lung: Secondary | ICD-10-CM

## 2024-03-27 DIAGNOSIS — T451X5A Adverse effect of antineoplastic and immunosuppressive drugs, initial encounter: Secondary | ICD-10-CM

## 2024-03-27 DIAGNOSIS — C541 Malignant neoplasm of endometrium: Secondary | ICD-10-CM | POA: Diagnosis present

## 2024-03-27 DIAGNOSIS — C7802 Secondary malignant neoplasm of left lung: Secondary | ICD-10-CM | POA: Diagnosis not present

## 2024-03-27 DIAGNOSIS — I427 Cardiomyopathy due to drug and external agent: Secondary | ICD-10-CM

## 2024-03-27 DIAGNOSIS — N183 Chronic kidney disease, stage 3 unspecified: Secondary | ICD-10-CM | POA: Diagnosis not present

## 2024-03-27 DIAGNOSIS — Z5112 Encounter for antineoplastic immunotherapy: Secondary | ICD-10-CM | POA: Insufficient documentation

## 2024-03-27 LAB — CBC WITH DIFFERENTIAL (CANCER CENTER ONLY)
Abs Immature Granulocytes: 0.02 K/uL (ref 0.00–0.07)
Basophils Absolute: 0 K/uL (ref 0.0–0.1)
Basophils Relative: 0 %
Eosinophils Absolute: 0.5 K/uL (ref 0.0–0.5)
Eosinophils Relative: 8 %
HCT: 38.2 % (ref 36.0–46.0)
Hemoglobin: 12.6 g/dL (ref 12.0–15.0)
Immature Granulocytes: 0 %
Lymphocytes Relative: 43 %
Lymphs Abs: 2.7 K/uL (ref 0.7–4.0)
MCH: 28.4 pg (ref 26.0–34.0)
MCHC: 33 g/dL (ref 30.0–36.0)
MCV: 86 fL (ref 80.0–100.0)
Monocytes Absolute: 0.7 K/uL (ref 0.1–1.0)
Monocytes Relative: 10 %
Neutro Abs: 2.5 K/uL (ref 1.7–7.7)
Neutrophils Relative %: 39 %
Platelet Count: 201 K/uL (ref 150–400)
RBC: 4.44 MIL/uL (ref 3.87–5.11)
RDW: 16.2 % — ABNORMAL HIGH (ref 11.5–15.5)
WBC Count: 6.4 K/uL (ref 4.0–10.5)
nRBC: 0 % (ref 0.0–0.2)

## 2024-03-27 LAB — CMP (CANCER CENTER ONLY)
ALT: 19 U/L (ref 0–44)
AST: 16 U/L (ref 15–41)
Albumin: 3.6 g/dL (ref 3.5–5.0)
Alkaline Phosphatase: 44 U/L (ref 38–126)
Anion gap: 6 (ref 5–15)
BUN: 34 mg/dL — ABNORMAL HIGH (ref 6–20)
CO2: 28 mmol/L (ref 22–32)
Calcium: 8.5 mg/dL — ABNORMAL LOW (ref 8.9–10.3)
Chloride: 103 mmol/L (ref 98–111)
Creatinine: 1.4 mg/dL — ABNORMAL HIGH (ref 0.44–1.00)
GFR, Estimated: 43 mL/min — ABNORMAL LOW (ref >60)
Glucose, Bld: 212 mg/dL — ABNORMAL HIGH (ref 70–99)
Potassium: 3.7 mmol/L (ref 3.5–5.1)
Sodium: 137 mmol/L (ref 135–145)
Total Bilirubin: 0.4 mg/dL (ref 0.0–1.2)
Total Protein: 6.6 g/dL (ref 6.5–8.1)

## 2024-03-27 LAB — TSH: TSH: 1.14 u[IU]/mL (ref 0.350–4.500)

## 2024-03-27 MED ORDER — SODIUM CHLORIDE 0.9% FLUSH
10.0000 mL | Freq: Once | INTRAVENOUS | Status: AC
Start: 2024-03-27 — End: 2024-03-27
  Administered 2024-03-27: 10 mL

## 2024-03-27 MED ORDER — FUROSEMIDE 20 MG PO TABS: 20.0000 mg | ORAL_TABLET | Freq: Two times a day (BID) | ORAL | 0 refills | Status: DC

## 2024-03-27 MED ORDER — SODIUM CHLORIDE 0.9 % IV SOLN: Freq: Once | INTRAVENOUS | Status: AC

## 2024-03-27 MED ORDER — HYDRALAZINE HCL 50 MG PO TABS: 50.0000 mg | ORAL_TABLET | Freq: Three times a day (TID) | ORAL | Status: AC

## 2024-03-27 MED ORDER — SODIUM CHLORIDE 0.9 % IV SOLN
1000.0000 mg | Freq: Once | INTRAVENOUS | Status: AC
  Administered 2024-03-27: 1000 mg via INTRAVENOUS
  Filled 2024-03-27: qty 20

## 2024-03-27 NOTE — Assessment & Plan Note (Addendum)
 The patient has history of cardiomyopathy from treatment She has shortness of breath CT imaging showed no lung parenchyma abnormalities She is noted to have gained over 10 pounds of weight since a month ago I recommend furosemide  as I suspect her shortness of breath is due to fluid overload She will take furosemide  twice daily for the next few days We will call her on Monday to check on her weight and her blood pressure We can stop furosemide  if we can achieve her baseline weight around 163 pounds I gave the patient verbal and written instruction in both the patient and her mother expressed verbal understanding about changes of her medications

## 2024-03-27 NOTE — Assessment & Plan Note (Addendum)
She has intermittent acute on chronic renal failure We discussed importance of hydration and risk factor modification 

## 2024-03-27 NOTE — Progress Notes (Signed)
 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, High grade serous MSI stable, Her2/neu 3+, Er/PR neg  Her last imaging in November 2024 showed complete response to treatment with no signs of cancer recurrence  I reviewed imaging study from August 2025 with the patient and her mother which showed no evidence of active disease we will continue treatment without delay  I plan to repeat imaging study again in December Essential hypertension Her blood pressure is now low I recommend the patient to hold hydralazine  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 treatment She has shortness of breath CT imaging showed no lung parenchyma abnormalities She is noted to have gained over 10 pounds of weight since a month ago I recommend furosemide  as I suspect her shortness of breath is due to fluid overload She will take furosemide  twice daily for the next few days We will call her on Monday to check on her weight and her blood pressure We can stop furosemide  if we can achieve her baseline weight around 163 pounds I gave the patient verbal and written instruction in both the patient and her mother expressed verbal understanding about changes of her medications Stage 3 chronic kidney disease, unspecified whether stage 3a or 3b CKD ([name]) She has intermittent acute on chronic renal failure We discussed importance of hydration and risk factor modification  Orders Placed This Encounter  Procedures   CBC with Differential (Cancer Center Only)    Standing Status:   Future    Expected Date:   06/19/2024    Expiration Date:   06/19/2025   CMP (Cancer  Center only)    Standing Status:   Future    Expected Date:   06/19/2024    Expiration Date:   06/19/2025   T4    Standing Status:   Future    Expected Date:   06/19/2024    Expiration Date:   06/19/2025   TSH    Standing Status:   Future    Expected Date:   06/19/2024    Expiration Date:   06/19/2025     Hicks Lonn, MD  INTERVAL HISTORY: she returns for treatment follow-up Complications related to previous cycle of chemotherapy included recent hospitalization/ ER visit,, elevated serum creatinine, and shortness of breath She was hospitalized recently for shortness of breath thought to have COPD exacerbation and was put on prednisone  Since discharge from the hospital, she is still short of breath with some signs of orthopnea Her oxygen saturation on room air is 95% She has occasional productive cough She denies recent smoking She has gained over 10 pounds of weight I reviewed imaging studies and discussed plan of care with the patient and her mother  PHYSICAL EXAMINATION: ECOG PERFORMANCE STATUS: 2 - Symptomatic, <50% confined to bed  Lab Results  Component Value Date   CAN125 145.0 (H) 01/29/2022      Latest Ref Rng & Units 03/27/2024   11:37 AM 03/25/2024    5:10 AM 03/24/2024    5:38 AM  CBC  WBC 4.0 - 10.5 K/uL 6.4  11.9  6.8   Hemoglobin 12.0 - 15.0 g/dL 87.3  87.8  87.2   Hematocrit 36.0 -  46.0 % 38.2  38.4  39.8   Platelets 150 - 400 K/uL 201  216  214       Chemistry      Component Value Date/Time   NA 137 03/27/2024 1137   NA 141 02/25/2019 1222   K 3.7 03/27/2024 1137   CL 103 03/27/2024 1137   CO2 28 03/27/2024 1137   BUN 34 (H) 03/27/2024 1137   BUN 17 02/25/2019 1222   CREATININE 1.40 (H) 03/27/2024 1137      Component Value Date/Time   CALCIUM  8.5 (L) 03/27/2024 1137   ALKPHOS 44 03/27/2024 1137   AST 16 03/27/2024 1137   ALT 19 03/27/2024 1137   BILITOT 0.4 03/27/2024 1137       Vitals:   03/27/24 1159  BP: 112/62  Pulse: 78  Resp: 18   Temp: 98.2 F (36.8 C)  SpO2: 95%   Filed Weights   03/27/24 1159  Weight: 174 lb 6.4 oz (79.1 kg)   Examination of her heart: Regular rate and rhythm, no murmurs Examination of the lungs: Clear to auscultation bilaterally, no expiratory wheezes  Other relevant data reviewed during this visit included CBC, CMP, CT imaging, recent hospital notes

## 2024-03-27 NOTE — Assessment & Plan Note (Addendum)
 Her blood pressure is now low I recommend the patient to hold hydralazine 

## 2024-03-27 NOTE — Assessment & Plan Note (Addendum)
 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

## 2024-03-29 LAB — CA 125: Cancer Antigen (CA) 125: 11.5 U/mL (ref 0.0–38.1)

## 2024-03-29 LAB — T4: T4, Total: 6.6 ug/dL (ref 4.5–12.0)

## 2024-03-31 ENCOUNTER — Telehealth: Payer: Self-pay

## 2024-03-31 NOTE — Telephone Encounter (Signed)
 Called Krista [name] back to check to see if her lasix  was working. She said it did bottom out her BP yesterday at 63/47!SABRA I told her to call us  next time it is that low. She said that her BP this morning was 103/66 and her weight is 169 lbs. She was 174 lbs at her last appt on 8/14. Andrea CHRISTELLA Plunk, RN

## 2024-04-01 ENCOUNTER — Telehealth: Payer: Self-pay | Admitting: *Deleted

## 2024-04-01 NOTE — Telephone Encounter (Signed)
 Per Dr. Lonn, called and spoke with pt mother to make her aware to hold lasix , cadura, and cozaar . Advised to only take metoprolol  and continue to check bp and will call for update on Friday for bp and wt check. Pt mother verbalized understanding.

## 2024-04-01 NOTE — Telephone Encounter (Signed)
 Tell her to hold lasix , cardura  and cozaar  Only BP medication she will continue is metoprolol  Please call her again on Friday, recommend continue to check BP and weight

## 2024-04-03 ENCOUNTER — Encounter (HOSPITAL_COMMUNITY): Payer: Self-pay

## 2024-04-03 ENCOUNTER — Ambulatory Visit (HOSPITAL_COMMUNITY)
Admission: EM | Admit: 2024-04-03 | Discharge: 2024-04-03 | Disposition: A | Discharge status: Home / Self Care | Attending: Family Medicine | Admitting: Family Medicine

## 2024-04-03 ENCOUNTER — Ambulatory Visit (INDEPENDENT_AMBULATORY_CARE_PROVIDER_SITE_OTHER)

## 2024-04-03 DIAGNOSIS — J441 Chronic obstructive pulmonary disease with (acute) exacerbation: Secondary | ICD-10-CM

## 2024-04-03 DIAGNOSIS — R0602 Shortness of breath: Secondary | ICD-10-CM

## 2024-04-03 LAB — POC SARS CORONAVIRUS 2 AG -  ED: SARS Coronavirus 2 Ag: NEGATIVE

## 2024-04-03 MED ORDER — IPRATROPIUM-ALBUTEROL 0.5-2.5 (3) MG/3ML IN SOLN
RESPIRATORY_TRACT | Status: AC
Start: 2024-04-03 — End: 2024-04-03
  Filled 2024-04-03: qty 3

## 2024-04-03 MED ORDER — ALBUTEROL SULFATE HFA 108 (90 BASE) MCG/ACT IN AERS: 2.0000 | INHALATION_SPRAY | RESPIRATORY_TRACT | 0 refills | Status: DC | PRN

## 2024-04-03 MED ORDER — IPRATROPIUM-ALBUTEROL 0.5-2.5 (3) MG/3ML IN SOLN: 3.0000 mL | Freq: Four times a day (QID) | RESPIRATORY_TRACT | 0 refills | Status: DC | PRN

## 2024-04-03 MED ORDER — IPRATROPIUM-ALBUTEROL 0.5-2.5 (3) MG/3ML IN SOLN
3.0000 mL | Freq: Once | RESPIRATORY_TRACT | Status: AC
  Administered 2024-04-03: 3 mL via RESPIRATORY_TRACT

## 2024-04-03 MED ORDER — PREDNISONE 20 MG PO TABS
40.0000 mg | ORAL_TABLET | Freq: Every day | ORAL | 0 refills | Status: AC
Start: 2024-04-03 — End: 2024-04-08

## 2024-04-03 NOTE — ED Triage Notes (Signed)
 Pt states SOB for the past few weeks.  States she was in the hospital last week for the same.  States she has emphysema and lung cancer. States she has been using her inhaler at home.

## 2024-04-03 NOTE — ED Provider Notes (Signed)
 [facility]    CSN: [id number] Arrival date & time: 04/03/24  1156      History   Chief Complaint Chief Complaint  Patient presents with   Shortness of Breath    HPI Krista [name] is a 63 y.o. female.    Shortness of Breath  Here for shortness of breath and wheezing and increased coughing.  She actually had been having trouble for 2 weeks.  She was admitted August 10 through 12 for the symptoms and it was deemed a COPD exacerbation.  She was improved at discharge, though she states she was still short of breath and wheezy when she was discharged.  When she awoke this morning at 6 AM, she was very short of breath and used a breathing treatment with some relief and was able to fall back asleep.  When she awoke again at 9 AM, which is about 3-1/2 hours before arriving here, she was very short of breath.  She has run out of her inhaler.  No new fever or chills.  She has had rhinorrhea for about 2 weeks.  She does note a headache that started while she was in the waiting room today.  She is allergic to statins and Zetia  Past medical history is significant for endometrial cancer, metastatic to lungs.  Chest x-ray reviewed from August 10 showed no acute pathology.  She states her oncologist took her off of prednisone  and so she never took it for this COPD exacerbation.  Of note when the oncologist saw this patient August 14 she thought fluid overload was causing her symptoms, so she prescribed Lasix .  The patient then had to stop taking Lasix  and Cardura  due to hypotension.  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   Anemia due to antineoplastic chemotherapy 03/14/2022   Pancytopenia, acquired ([name]) 02/09/2022   Other constipation 02/09/2022   Generalized pain 01/28/2022   Rib fractures 01/28/2022   Metastatic disease ([name]) 01/28/2022   Endometrial cancer ([name]) 01/28/2022   Cough 01/28/2022   Elevated serum creatinine 01/28/2022   Protein-calorie malnutrition, moderate ([name]) 01/28/2022   Goals of care, counseling/discussion 01/28/2022   Elevated troponin 01/28/2022   Hydronephrosis of right kidney 01/28/2022   Hemoptysis 01/28/2022   Metastasis to lung ([name]) 01/28/2022   CKD (chronic kidney disease), stage III ([name]) 01/28/2022   Medical non-compliance 01/28/2022   Statin myopathy 11/05/2019   Dyspnea    Knee pain    Rhabdomyolysis due to statin therapy    Demand ischemia ([name])    Sepsis ([name]) 05/13/2019   NSTEMI (non-ST elevated myocardial infarction) ([name]) 05/13/2019   Coronary artery disease 02/12/2019   Hyperlipidemia 02/12/2019   Unstable angina ([name])    Near syncope 12/14/2018   Chest tightness 12/14/2018   Prolonged Q-T interval on ECG    Acute gastroenteritis 08/02/2012   Dehydration 08/02/2012   AKI (acute kidney injury) ([name]) 08/02/2012   Hypokalemia 08/02/2012   Type 2 diabetes mellitus ([name]) 08/02/2012  HTN (hypertension) 08/02/2012   Tobacco abuse 08/02/2012   Allergic rhinitis 10/21/2010   Disorder of bursae and tendons in shoulder region 10/21/2010   SEBACEOUS CYST, INFECTED 01/31/2010   GUAIAC POSITIVE STOOL 10/05/2009   RECTAL BLEEDING, HX OF 09/21/2009   Nonspecific (abnormal) findings on radiological and other examination of body structure 05/19/2009   RIGHT LOWER LUNG FIELD CRACKLES 05/19/2009   Diabetes ([name]) 02/12/2009   Pain in limb 02/12/2009   DENTAL CARIES 04/21/2008   Essential hypertension 05/01/2007     Past Surgical History:  Procedure Laterality Date   ANKLE SURGERY     left ankle - was broken   CORONARY STENT INTERVENTION N/A 12/16/2018   Procedure: CORONARY STENT INTERVENTION;  Surgeon: Anner Alm ORN, MD;  Location: [facility] INVASIVE CV LAB;  Service: Cardiovascular;  Laterality: N/A;  LAD   IR IMAGING GUIDED PORT INSERTION  01/31/2022   IR REMOVAL TUN ACCESS W/ PORT W/O FL MOD SED  01/31/2022   LEFT HEART CATH AND CORONARY ANGIOGRAPHY N/A 12/16/2018   Procedure: LEFT HEART CATH AND CORONARY ANGIOGRAPHY;  Surgeon: Anner Alm ORN, MD;  Location: [facility] INVASIVE CV LAB;  Service: Cardiovascular;  Laterality: N/A;    OB History   No obstetric history on file.      Home Medications    Prior to Admission medications   Medication Sig Start Date End Date Taking? Authorizing Provider  albuterol  (VENTOLIN  HFA) 108 (90 Base) MCG/ACT inhaler Inhale 2 puffs into the lungs every 4 (four) hours as needed for wheezing or shortness of breath. 04/03/24  Yes Lakashia Collison, Sharlet POUR, MD  ipratropium-albuterol  (DUONEB) 0.5-2.5 (3) MG/3ML SOLN Take 3 mLs by nebulization every 6 (six) hours as needed. 04/03/24  Yes Vonna Sharlet POUR, MD  predniSONE  (DELTASONE ) 20 MG tablet Take 2 tablets (40 mg total) by mouth daily with breakfast for 5 days. 04/03/24 04/08/24 Yes Vonna Sharlet POUR, MD  acetaminophen  (TYLENOL ) 500 MG tablet Take 1,000 mg by mouth every 6 (six) hours as needed for mild pain or headache.    [provider]  aspirin  EC 81 MG tablet Take 1 tablet (81 mg total) by mouth daily. Swallow whole. 09/22/22   Court Dorn PARAS, MD  clopidogrel  (PLAVIX ) 75 MG tablet Take 75 mg by mouth daily.    [provider]  dapagliflozin  propanediol (FARXIGA ) 10 MG TABS tablet Take 1 tablet (10 mg total) by mouth daily before breakfast. 01/23/24   Sabharwal, Aditya, DO  doxazosin  (CARDURA ) 4 MG tablet Take 1 tablet (4 mg total) by mouth daily. 05/29/23   Lonn Hicks, MD  furosemide  (LASIX ) 20 MG tablet Take  1 tablet (20 mg total) by mouth 2 (two) times daily. 03/27/24   Lonn Hicks, MD  hydrALAZINE  (APRESOLINE ) 50 MG tablet Take 1 tablet (50 mg total) by mouth 3 (three) times daily. HOLD on 8/14-NG 03/27/24 04/26/24  Lonn Hicks, MD  levocetirizine (XYZAL) 5 MG tablet Take 5 mg by mouth every evening.    [provider]  lidocaine -prilocaine  (EMLA ) cream Apply 1 application topically as needed. 02/09/22   Lonn Hicks, MD  losartan  (COZAAR ) 100 MG tablet Take 1 tablet by mouth once daily 02/22/24   Gorsuch, Ni, MD  metoprolol  succinate (TOPROL  XL) 100 MG 24 hr tablet Take 1 tablet (100 mg total) by mouth daily. 02/14/24   Lonn Hicks, MD  nitroGLYCERIN  (NITROSTAT ) 0.4 MG SL tablet Place 1 tablet (0.4 mg total) under the tongue every 5 (five) minutes as needed for chest pain. 10/28/19  Court Dorn PARAS, MD  polyethylene glycol (MIRALAX ) 17 g packet Take 17 g by mouth daily. Patient taking differently: Take 17 g by mouth daily as needed for mild constipation or moderate constipation. 02/09/22   Lonn Hicks, MD  traZODone  (DESYREL ) 50 MG tablet Take 1 tablet (50 mg total) by mouth at bedtime. 02/14/24   Lonn Hicks, MD    Family History Family History  Problem Relation Age of Onset   Diabetes Mother    COPD Mother    Hypertension Mother    Diabetes Father    Hypertension Father     Social History Social History   Tobacco Use   Smoking status: Former    Current packs/day: 0.00    Average packs/day: 0.5 packs/day for 33.0 years (16.5 ttl pk-yrs)    Types: Cigarettes    Start date: 01/08/1989    Quit date: 01/08/2022    Years since quitting: 2.2   Smokeless tobacco: Never  Vaping Use   Vaping status: Never Used  Substance Use Topics   Alcohol use: No   Drug use: No     Allergies   Statins, Agrostis alba pollen extract [gramineae pollens], and Zetia [ezetimibe]   Review of Systems Review of Systems  Respiratory:  Positive for shortness of breath.      Physical Exam Triage  Vital Signs ED Triage Vitals  Encounter Vitals Group     BP 04/03/24 1245 (!) 174/104     Girls Systolic BP Percentile --      Girls Diastolic BP Percentile --      Boys Systolic BP Percentile --      Boys Diastolic BP Percentile --      Pulse Rate 04/03/24 1245 81     Resp 04/03/24 1245 (!) 32     Temp 04/03/24 1245 98.2 F (36.8 C)     Temp Source 04/03/24 1245 Oral     SpO2 04/03/24 1245 96 %     Weight --      Height --      Head Circumference --      Peak Flow --      Pain Score 04/03/24 1246 0     Pain Loc --      Pain Education --      Exclude from Growth Chart --    No data found.  Updated Vital Signs BP (!) 151/96 (BP Location: Left Arm)   Pulse 87   Temp 97.9 F (36.6 C) (Oral)   Resp 20   SpO2 95%   Visual Acuity Right Eye Distance:   Left Eye Distance:   Bilateral Distance:    Right Eye Near:   Left Eye Near:    Bilateral Near:     Physical Exam Vitals reviewed.  Constitutional:      Appearance: She is not toxic-appearing or diaphoretic.     Comments: Initially she is working hard to breathe with very tight breath sounds.  HENT:     Nose: Nose normal.     Mouth/Throat:     Mouth: Mucous membranes are moist.     Pharynx: No oropharyngeal exudate or posterior oropharyngeal erythema.  Eyes:     Extraocular Movements: Extraocular movements intact.     Conjunctiva/sclera: Conjunctivae normal.     Pupils: Pupils are equal, round, and reactive to light.  Cardiovascular:     Rate and Rhythm: Normal rate and regular rhythm.     Heart sounds: No murmur heard. Pulmonary:  Comments: Initially, breath sounds are very tight with tight expiratory wheezing.  After neb treatment, this is much improved but she still has some prolonged expiratory phase.  There is some wheezing still present.  When she is quiet and not talking she is not consistently short of breath, though she does at times have her respiratory rate increased. Musculoskeletal:     Cervical  back: Neck supple.  Lymphadenopathy:     Cervical: No cervical adenopathy.  Skin:    Capillary Refill: Capillary refill takes less than 2 seconds.     Coloration: Skin is not jaundiced or pale.  Neurological:     General: No focal deficit present.     Mental Status: She is alert and oriented to person, place, and time.  Psychiatric:        Behavior: Behavior normal.      UC Treatments / Results  Labs (all labs ordered are listed, but only abnormal results are displayed) Labs Reviewed  POC SARS CORONAVIRUS 2 AG -  ED    EKG   Radiology DG Chest 2 View Result Date: 04/03/2024 CLINICAL DATA:  acute cough, wheezing, shortness of breath EXAM: CHEST - 2 VIEW COMPARISON:  03/23/2024. FINDINGS: The heart size and mediastinal contours are unchanged. Stable right chest Port-A-Cath. Aortic atherosclerosis. Emphysematous changes. No focal consolidation, pleural effusion, or pneumothorax. No acute osseous abnormality. IMPRESSION: 1. No acute cardiopulmonary findings. 2. Emphysema. Electronically Signed   By: Harrietta Sherry M.D.   On: 04/03/2024 14:00    Procedures Procedures (including critical care time)  Medications Ordered in UC Medications  ipratropium-albuterol  (DUONEB) 0.5-2.5 (3) MG/3ML nebulizer solution 3 mL (3 mLs Nebulization Given 04/03/24 1256)    Initial Impression / Assessment and Plan / UC Course  I have reviewed the triage vital signs and the nursing notes.  Pertinent labs & imaging results that were available during my care of the patient were reviewed by me and considered in my medical decision making (see chart for details).     After the DuoNeb treatment she is able to speak in complete sentences.  She states that how she feels.  After the neb treatment is how she is felt better since she was discharged.  Recheck of vitals shows O2 sat continues to be good and her respiratory rate is reduced to normal.  Chest x-ray is negative for any acute  pathology.  COVID test is negative.  I have sent in DuoNeb for the nebulizer, an albuterol  inhaler and 5 days of a prednisone  burst.  I think it is important for her to be more comfortable at least try to address the COPD exacerbation that apparently has been happening.  She is advised that this will raise her sugars.  I have asked her to try to follow-up with her primary care and she is given contact information for pulmonary. Final Clinical Impressions(s) / UC Diagnoses   Final diagnoses:  Shortness of breath  COPD exacerbation [facility]     Discharge Instructions      Your COVID test was negative.  Your chest x-ray is clear without sign of pneumonia or fluid.  Albuterol  inhaler--do 2 puffs every 4 hours as needed for shortness of breath or wheezing  Use ipratropium-albuterol  in the nebulizer every 6 hours as needed for shortness of breath or wheezing.  We gave you 1 dose of this medicine and the breathing treatment here in the clinic  Take prednisone  20 mg--2 daily for 5 days.  This is for inflammation in  your lungs.  It can make your sugars go higher.  Drink plenty of fluids and be as good as you can on your diet for the next few days.  Please follow-up with your primary care  If you get worse again and the inhalers or breathing treatments are not providing any or much relief, please go to the emergency room for urgent evaluation     ED Prescriptions     Medication Sig Dispense Auth. Provider   albuterol  (VENTOLIN  HFA) 108 (90 Base) MCG/ACT inhaler Inhale 2 puffs into the lungs every 4 (four) hours as needed for wheezing or shortness of breath. 1 each Vonna Sharlet POUR, MD   ipratropium-albuterol  (DUONEB) 0.5-2.5 (3) MG/3ML SOLN Take 3 mLs by nebulization every 6 (six) hours as needed. 225 mL Vonna Sharlet POUR, MD   predniSONE  (DELTASONE ) 20 MG tablet Take 2 tablets (40 mg total) by mouth daily with breakfast for 5 days. 10 tablet Vonna Zaidin Blyden K, MD      PDMP not  reviewed this encounter.   Vonna Sharlet POUR, MD 04/03/24 [phone]

## 2024-04-03 NOTE — Discharge Instructions (Signed)
 Your COVID test was negative.  Your chest x-ray is clear without sign of pneumonia or fluid.  Albuterol  inhaler--do 2 puffs every 4 hours as needed for shortness of breath or wheezing  Use ipratropium-albuterol  in the nebulizer every 6 hours as needed for shortness of breath or wheezing.  We gave you 1 dose of this medicine and the breathing treatment here in the clinic  Take prednisone  20 mg--2 daily for 5 days.  This is for inflammation in your lungs.  It can make your sugars go higher.  Drink plenty of fluids and be as good as you can on your diet for the next few days.  Please follow-up with your primary care  If you get worse again and the inhalers or breathing treatments are not providing any or much relief, please go to the emergency room for urgent evaluation

## 2024-04-08 ENCOUNTER — Telehealth: Payer: Self-pay

## 2024-04-08 NOTE — Telephone Encounter (Signed)
 LVM for patient regarding after-hours message we had received. Patient requested to know the name of the medication she is receiving at the [facility].  Provided patient with the name and spelling of the medication. Callback number left should patient have any additional questions or concerns.

## 2024-04-10 ENCOUNTER — Other Ambulatory Visit: Payer: Self-pay | Admitting: Nephrology

## 2024-04-10 DIAGNOSIS — N179 Acute kidney failure, unspecified: Secondary | ICD-10-CM

## 2024-04-14 ENCOUNTER — Encounter: Payer: Self-pay | Admitting: Hematology and Oncology

## 2024-04-15 ENCOUNTER — Other Ambulatory Visit

## 2024-04-16 ENCOUNTER — Ambulatory Visit
Admission: RE | Admit: 2024-04-16 | Discharge: 2024-04-16 | Disposition: A | Discharge status: Home / Self Care | Source: Ambulatory Visit | Attending: Nephrology | Admitting: Nephrology

## 2024-04-16 DIAGNOSIS — N179 Acute kidney failure, unspecified: Secondary | ICD-10-CM

## 2024-05-01 ENCOUNTER — Other Ambulatory Visit: Payer: Self-pay | Admitting: Hematology and Oncology

## 2024-05-08 ENCOUNTER — Inpatient Hospital Stay: Attending: Hematology and Oncology

## 2024-05-08 ENCOUNTER — Encounter: Payer: Self-pay | Admitting: Hematology and Oncology

## 2024-05-08 ENCOUNTER — Inpatient Hospital Stay (HOSPITAL_BASED_OUTPATIENT_CLINIC_OR_DEPARTMENT_OTHER): Admitting: Hematology and Oncology

## 2024-05-08 ENCOUNTER — Inpatient Hospital Stay

## 2024-05-08 VITALS — BP 117/52 | HR 68 | Temp 97.8°F | Resp 18 | Ht 63.0 in | Wt 176.2 lb

## 2024-05-08 DIAGNOSIS — Z79620 Long term (current) use of immunosuppressive biologic: Secondary | ICD-10-CM | POA: Diagnosis not present

## 2024-05-08 DIAGNOSIS — N183 Chronic kidney disease, stage 3 unspecified: Secondary | ICD-10-CM | POA: Diagnosis not present

## 2024-05-08 DIAGNOSIS — C541 Malignant neoplasm of endometrium: Secondary | ICD-10-CM

## 2024-05-08 DIAGNOSIS — C7802 Secondary malignant neoplasm of left lung: Secondary | ICD-10-CM

## 2024-05-08 DIAGNOSIS — C7801 Secondary malignant neoplasm of right lung: Secondary | ICD-10-CM

## 2024-05-08 DIAGNOSIS — Z5112 Encounter for antineoplastic immunotherapy: Secondary | ICD-10-CM | POA: Insufficient documentation

## 2024-05-08 DIAGNOSIS — I10 Essential (primary) hypertension: Secondary | ICD-10-CM | POA: Diagnosis not present

## 2024-05-08 LAB — CBC WITH DIFFERENTIAL (CANCER CENTER ONLY)
Abs Immature Granulocytes: 0.02 K/uL (ref 0.00–0.07)
Basophils Absolute: 0 K/uL (ref 0.0–0.1)
Basophils Relative: 0 %
Eosinophils Absolute: 0.2 K/uL (ref 0.0–0.5)
Eosinophils Relative: 3 %
HCT: 37.4 % (ref 36.0–46.0)
Hemoglobin: 12.4 g/dL (ref 12.0–15.0)
Immature Granulocytes: 0 %
Lymphocytes Relative: 32 %
Lymphs Abs: 1.9 K/uL (ref 0.7–4.0)
MCH: 28.6 pg (ref 26.0–34.0)
MCHC: 33.2 g/dL (ref 30.0–36.0)
MCV: 86.2 fL (ref 80.0–100.0)
Monocytes Absolute: 0.7 K/uL (ref 0.1–1.0)
Monocytes Relative: 12 %
Neutro Abs: 3.1 K/uL (ref 1.7–7.7)
Neutrophils Relative %: 53 %
Platelet Count: 211 K/uL (ref 150–400)
RBC: 4.34 MIL/uL (ref 3.87–5.11)
RDW: 15.4 % (ref 11.5–15.5)
WBC Count: 6 K/uL (ref 4.0–10.5)
nRBC: 0 % (ref 0.0–0.2)

## 2024-05-08 LAB — CMP (CANCER CENTER ONLY)
ALT: 12 U/L (ref 0–44)
AST: 19 U/L (ref 15–41)
Albumin: 3.6 g/dL (ref 3.5–5.0)
Alkaline Phosphatase: 44 U/L (ref 38–126)
Anion gap: 7 (ref 5–15)
BUN: 44 mg/dL — ABNORMAL HIGH (ref 6–20)
CO2: 31 mmol/L (ref 22–32)
Calcium: 8.6 mg/dL — ABNORMAL LOW (ref 8.9–10.3)
Chloride: 102 mmol/L (ref 98–111)
Creatinine: 1.87 mg/dL — ABNORMAL HIGH (ref 0.44–1.00)
GFR, Estimated: 30 mL/min — ABNORMAL LOW (ref >60)
Glucose, Bld: 119 mg/dL — ABNORMAL HIGH (ref 70–99)
Potassium: 3.5 mmol/L (ref 3.5–5.1)
Sodium: 140 mmol/L (ref 135–145)
Total Bilirubin: 0.3 mg/dL (ref 0.0–1.2)
Total Protein: 6.8 g/dL (ref 6.5–8.1)

## 2024-05-08 LAB — TSH: TSH: 1.3 u[IU]/mL (ref 0.350–4.500)

## 2024-05-08 MED ORDER — SODIUM CHLORIDE 0.9 % IV SOLN: Freq: Once | INTRAVENOUS | Status: AC

## 2024-05-08 MED ORDER — SODIUM CHLORIDE 0.9% FLUSH: 10.0000 mL | INTRAVENOUS | Status: DC | PRN

## 2024-05-08 MED ORDER — SODIUM CHLORIDE 0.9 % IV SOLN
1000.0000 mg | Freq: Once | INTRAVENOUS | Status: AC
  Administered 2024-05-08: 1000 mg via INTRAVENOUS
  Filled 2024-05-08: qty 20

## 2024-05-08 NOTE — Assessment & Plan Note (Addendum)
 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

## 2024-05-08 NOTE — Patient Instructions (Signed)

## 2024-05-08 NOTE — Progress Notes (Signed)
 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, High grade serous MSI stable, Her2/neu 3+, Er/PR neg  Her last imaging in November 2024 showed complete response to treatment with no signs of cancer recurrence  Imaging study from August 2025 showed no evidence of active disease we will continue treatment without delay  I plan to repeat imaging study again in December Essential hypertension Blood pressure fluctuate widely Blood pressure today is normal but generally high at home She will continue medical management Stage 3 chronic kidney disease, unspecified whether stage 3a or 3b CKD ([name]) She has intermittent acute on chronic renal failure We discussed importance of hydration and risk factor modification  Orders Placed This Encounter  Procedures   CBC with Differential (Cancer Center Only)    Standing Status:   Future    Expected Date:   07/31/2024    Expiration Date:   07/31/2025   CMP (Cancer Center only)    Standing Status:   Future    Expected Date:   07/31/2024    Expiration Date:   07/31/2025   T4    Standing Status:   Future    Expected Date:   07/31/2024    Expiration Date:   07/31/2025   TSH    Standing Status:   Future    Expected Date:   07/31/2024    Expiration Date:   07/31/2025     Hicks Lonn, MD  INTERVAL HISTORY: she returns for treatment follow-up Complications related to previous cycle of chemotherapy included fatigue,, electrolyte abnormalities, and elevated serum creatinine She complained of excessive fatigue Her blood pressure at home is usually higher  PHYSICAL EXAMINATION: ECOG PERFORMANCE STATUS: 1 - Symptomatic but completely  ambulatory  Lab Results  Component Value Date   CAN125 11.5 03/27/2024   CAN125 145.0 (H) 01/29/2022      Latest Ref Rng & Units 05/08/2024   12:02 PM 03/27/2024   11:37 AM 03/25/2024    5:10 AM  CBC  WBC 4.0 - 10.5 K/uL 6.0  6.4  11.9   Hemoglobin 12.0 - 15.0 g/dL 87.5  87.3  87.8   Hematocrit 36.0 - 46.0 % 37.4  38.2  38.4   Platelets 150 - 400 K/uL 211  201  216       Chemistry      Component Value Date/Time   NA 140 05/08/2024 1202   NA 141 02/25/2019 1222   K 3.5 05/08/2024 1202   CL 102 05/08/2024 1202   CO2 31 05/08/2024 1202   BUN 44 (H) 05/08/2024 1202   BUN 17 02/25/2019 1222   CREATININE 1.87 (H) 05/08/2024 1202      Component Value Date/Time   CALCIUM  8.6 (L) 05/08/2024 1202   ALKPHOS 44 05/08/2024 1202   AST 19 05/08/2024 1202   ALT 12 05/08/2024 1202   BILITOT 0.3 05/08/2024 1202       Vitals:   05/08/24 1225  BP: (!) 117/52  Pulse: 68  Resp: 18  Temp: 97.8 F (36.6 C)  SpO2: 99%   Filed Weights   05/08/24 1225  Weight: 176 lb 3.2 oz (79.9 kg)   Other relevant data reviewed during this visit included CBC and CMP

## 2024-05-08 NOTE — Assessment & Plan Note (Addendum)
 Blood pressure fluctuate widely Blood pressure today is normal but generally high at home She will continue medical management

## 2024-05-08 NOTE — Patient Instructions (Signed)
 [facility] - A DEPT OF [facility]  Discharge Instructions: Thank you for choosing [facility] to provide your oncology and hematology care.   If you have a lab appointment with the Cancer Center, please go directly to the Cancer Center and check in at the registration area.   Wear comfortable clothing and clothing appropriate for easy access to any Portacath or PICC line.   We strive to give you quality time with your provider. You may need to reschedule your appointment if you arrive late (15 or more minutes).  Arriving late affects you and other patients whose appointments are after yours.  Also, if you miss three or more appointments without notifying the office, you may be dismissed from the clinic at the provider's discretion.      For prescription refill requests, have your pharmacy contact our office and allow 72 hours for refills to be completed.    Today you received the following chemotherapy and/or immunotherapy agents jemperli      To help prevent nausea and vomiting after your treatment, we encourage you to take your nausea medication as directed.  BELOW ARE SYMPTOMS THAT SHOULD BE REPORTED IMMEDIATELY: *FEVER GREATER THAN 100.4 F (38 C) OR HIGHER *CHILLS OR SWEATING *NAUSEA AND VOMITING THAT IS NOT CONTROLLED WITH YOUR NAUSEA MEDICATION *UNUSUAL SHORTNESS OF BREATH *UNUSUAL BRUISING OR BLEEDING *URINARY PROBLEMS (pain or burning when urinating, or frequent urination) *BOWEL PROBLEMS (unusual diarrhea, constipation, pain near the anus) TENDERNESS IN MOUTH AND THROAT WITH OR WITHOUT PRESENCE OF ULCERS (sore throat, sores in mouth, or a toothache) UNUSUAL RASH, SWELLING OR PAIN  UNUSUAL VAGINAL DISCHARGE OR ITCHING   Items with * indicate a potential emergency and should be followed up as soon as possible or go to the Emergency Department if any problems should occur.  Please show the CHEMOTHERAPY ALERT CARD or IMMUNOTHERAPY  ALERT CARD at check-in to the Emergency Department and triage nurse.  Should you have questions after your visit or need to cancel or reschedule your appointment, please contact [facility] - A DEPT OF [facility]  Dept: [phone]  and follow the prompts.  Office hours are 8:00 a.m. to 4:30 p.m. Monday - Friday. Please note that voicemails left after 4:00 p.m. may not be returned until the following business day.  We are closed weekends and major holidays. You have access to a nurse at all times for urgent questions. Please call the main number to the clinic Dept: [phone] and follow the prompts.   For any non-urgent questions, you may also contact your provider using MyChart. We now offer e-Visits for anyone 51 and older to request care online for non-urgent symptoms. For details visit [email or web address].   Also download the MyChart app! Go to the app store, search "MyChart", open the app, select [facility], and log in with your MyChart username and password.

## 2024-05-08 NOTE — Assessment & Plan Note (Addendum)
She has intermittent acute on chronic renal failure We discussed importance of hydration and risk factor modification 

## 2024-05-09 LAB — T4: T4, Total: 5.7 ug/dL (ref 4.5–12.0)

## 2024-05-13 ENCOUNTER — Encounter: Payer: Self-pay | Admitting: Hematology and Oncology

## 2024-05-19 ENCOUNTER — Ambulatory Visit: Admitting: Pulmonary Disease

## 2024-05-19 ENCOUNTER — Encounter: Payer: Self-pay | Admitting: Pulmonary Disease

## 2024-05-19 VITALS — BP 116/79 | HR 99 | Ht 63.0 in | Wt 179.0 lb

## 2024-05-19 DIAGNOSIS — J302 Other seasonal allergic rhinitis: Secondary | ICD-10-CM

## 2024-05-19 DIAGNOSIS — J449 Chronic obstructive pulmonary disease, unspecified: Secondary | ICD-10-CM

## 2024-05-19 MED ORDER — IPRATROPIUM-ALBUTEROL 0.5-2.5 (3) MG/3ML IN SOLN: 3.0000 mL | Freq: Four times a day (QID) | RESPIRATORY_TRACT | 3 refills | Status: AC | PRN

## 2024-05-19 MED ORDER — MONTELUKAST SODIUM 10 MG PO TABS: 10.0000 mg | ORAL_TABLET | Freq: Every day | ORAL | 11 refills | Status: AC

## 2024-05-19 MED ORDER — ALBUTEROL SULFATE HFA 108 (90 BASE) MCG/ACT IN AERS: 2.0000 | INHALATION_SPRAY | RESPIRATORY_TRACT | 11 refills | Status: AC | PRN

## 2024-05-19 NOTE — Progress Notes (Signed)
 Subjective:   PATIENT ID: Krista [name] GENDER: female DOB: 27-Jan-1961, MRN: [id number]  HPI Discussed the use of AI scribe software for clinical note transcription with the patient, who gave verbal consent to proceed.  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 She is accompanied by her mother, Dorothyann.   She experiences shortness of breath, wheezing, and occasional cough with mucus production, without hemoptysis. Her symptoms worsen with seasonal changes, particularly in fall and spring. She was recently diagnosed with COPD and emphysema.  Symbicort 160 mcg 2 puffs twice daily has been used for about a week, improving symptoms, though shortness of breath persists. Ventolin  is used as needed, providing relief, and nebulizer treatments are occasionally helpful. She has not used montelukast.  She quit smoking a year ago after a long history of smoking a pack every three days since age 77. 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 She is currently on disability and not working.    Past Medical History:  Diagnosis Date   Arthritis    CAD (coronary artery disease)    Depression    Diabetes mellitus    Hypertension    NSTEMI (non-ST elevated myocardial infarction) ([name])      Family History  Problem Relation Age of Onset   Diabetes Mother    COPD Mother    Hypertension Mother    Diabetes Father    Hypertension Father      Social History   Socioeconomic History   Marital status: Single    Spouse name: Not on file   Number of children: Not on file   Years of education: Not on file   Highest education level: Not on file  Occupational History   Not on file  Tobacco Use   Smoking status: Former    Current packs/day: 0.00    Average packs/day: 0.5  packs/day for 33.0 years (16.5 ttl pk-yrs)    Types: Cigarettes    Start date: 01/08/1989    Quit date: 01/08/2022    Years since quitting: 2.3   Smokeless tobacco: Never  Vaping Use   Vaping status: Never Used  Substance and Sexual Activity   Alcohol use: No   Drug use: No   Sexual activity: Yes    Birth control/protection: Post-menopausal  Other Topics Concern   Not on file  Social History Narrative   Not on file   Social Drivers of Health   Financial Resource Strain: Not on File (12/01/2021)   Received from [employer]    Financial Resource Strain: 0  Food Insecurity: No Food Insecurity (03/24/2024)   Hunger Vital Sign    Worried About Running Out of Food in the Last Year: Never true    Ran Out of Food in the Last Year: Never true  Transportation Needs: No Transportation Needs (03/24/2024)   PRAPARE - Administrator, Civil Service (Medical): No    Lack of Transportation (Non-Medical): No  Physical Activity: Not on File (12/01/2021)   Received from [facility]   Physical Activity    Physical Activity: 0  Stress: Not on File (12/01/2021)   Received [facility]   Stress    Stress: 0  Social Connections: Not on File (04/28/2023)   Received from [facility]  Social Connections    Connectedness: 0  Intimate Partner Violence: Not At Risk (03/24/2024)   Humiliation, Afraid, Rape, and Kick questionnaire    Fear of Current or Ex-Partner: No    Emotionally Abused: No    Physically Abused: No    Sexually Abused: No     Allergies  Allergen Reactions   Statins Itching    itching itching   Agrostis Alba Pollen Extract [Gramineae Pollens]    Zetia [Ezetimibe] Itching     Outpatient Medications Prior to Visit  Medication Sig Dispense Refill   acetaminophen  (TYLENOL ) 500 MG tablet Take 1,000 mg by mouth every 6 (six) hours as needed for mild pain or headache.     aspirin  EC 81 MG tablet Take 1 tablet (81 mg total) by mouth daily. Swallow whole.      budesonide-formoterol (SYMBICORT) 160-4.5 MCG/ACT inhaler Inhale 2 puffs into the lungs 2 (two) times daily.     clopidogrel  (PLAVIX ) 75 MG tablet Take 75 mg by mouth daily.     dapagliflozin  propanediol (FARXIGA ) 10 MG TABS tablet Take 1 tablet (10 mg total) by mouth daily before breakfast. 90 tablet 3   doxazosin  (CARDURA ) 4 MG tablet Take 1 tablet (4 mg total) by mouth daily. 30 tablet 3   furosemide  (LASIX ) 20 MG tablet Take 1 tablet (20 mg total) by mouth 2 (two) times daily. 60 tablet 0   hydrALAZINE  (APRESOLINE ) 50 MG tablet Take 1 tablet (50 mg total) by mouth 3 (three) times daily. HOLD on 8/14-NG     levocetirizine (XYZAL) 5 MG tablet Take 5 mg by mouth every evening.     lidocaine -prilocaine  (EMLA ) cream Apply 1 application topically as needed. 30 g 0   losartan  (COZAAR ) 100 MG tablet Take 1 tablet by mouth once daily 90 tablet 0   metoprolol  succinate (TOPROL  XL) 100 MG 24 hr tablet Take 1 tablet (100 mg total) by mouth daily. 30 tablet 3   nitroGLYCERIN  (NITROSTAT ) 0.4 MG SL tablet Place 1 tablet (0.4 mg total) under the tongue every 5 (five) minutes as needed for chest pain. 30 tablet 2   polyethylene glycol (MIRALAX ) 17 g packet Take 17 g by mouth daily. (Patient taking differently: Take 17 g by mouth daily as needed for mild constipation or moderate constipation.) 30 each 1   traZODone  (DESYREL ) 50 MG tablet Take 1 tablet (50 mg total) by mouth at bedtime. 30 tablet 1   albuterol  (VENTOLIN  HFA) 108 (90 Base) MCG/ACT inhaler Inhale 2 puffs into the lungs every 4 (four) hours as needed for wheezing or shortness of breath. 1 each 0   ipratropium-albuterol  (DUONEB) 0.5-2.5 (3) MG/3ML SOLN Take 3 mLs by nebulization every 6 (six) hours as needed. 225 mL 0   No facility-administered medications prior to visit.   Review of Systems  Constitutional:  Negative for chills, fever, malaise/fatigue and weight loss.  HENT:  Negative for congestion, sinus pain and sore throat.   Eyes: Negative.    Respiratory:  Negative for cough, hemoptysis, sputum production, shortness of breath and wheezing.   Cardiovascular:  Negative for chest pain, palpitations, orthopnea, claudication and leg swelling.  Gastrointestinal:  Negative for abdominal pain, heartburn, nausea and vomiting.  Genitourinary: Negative.   Musculoskeletal:  Negative for joint pain and myalgias.  Skin:  Negative for rash.  Neurological:  Negative for weakness.  Endo/Heme/Allergies: Negative.   Psychiatric/Behavioral: Negative.     Objective:   Vitals:   05/19/24 1336  BP: 116/79  Pulse: 99  SpO2: 96%  Weight: 179 lb (81.2 kg)  Height: 5' 3 (1.6 m)   Physical Exam Constitutional:      General: She is not in acute distress.    Appearance: Normal appearance.  Eyes:     General: No scleral icterus.    Conjunctiva/sclera: Conjunctivae normal.  Cardiovascular:     Rate and Rhythm: Normal rate and regular rhythm.  Pulmonary:     Breath sounds: No wheezing, rhonchi or rales.  Musculoskeletal:     Right lower leg: No edema.     Left lower leg: No edema.  Skin:    General: Skin is warm and dry.  Neurological:     General: No focal deficit present.    CBC    Component Value Date/Time   WBC 6.0 05/08/2024 1202   WBC 11.9 (H) 03/25/2024 0510   RBC 4.34 05/08/2024 1202   HGB 12.4 05/08/2024 1202   HCT 37.4 05/08/2024 1202   PLT 211 05/08/2024 1202   MCV 86.2 05/08/2024 1202   MCH 28.6 05/08/2024 1202   MCHC 33.2 05/08/2024 1202   RDW 15.4 05/08/2024 1202   LYMPHSABS 1.9 05/08/2024 1202   MONOABS 0.7 05/08/2024 1202   EOSABS 0.2 05/08/2024 1202   BASOSABS 0.0 05/08/2024 1202      Latest Ref Rng & Units 05/08/2024   12:02 PM 03/27/2024   11:37 AM 03/25/2024    5:10 AM  BMP  Glucose 70 - 99 mg/dL 880  787  863   BUN 6 - 20 mg/dL 44  34  42   Creatinine 0.44 - 1.00 mg/dL 8.12  8.59  8.82   Sodium 135 - 145 mmol/L 140  137  137   Potassium 3.5 - 5.1 mmol/L 3.5  3.7  3.7   Chloride 98 - 111 mmol/L 102   103  105   CO2 22 - 32 mmol/L 31  28  23    Calcium  8.9 - 10.3 mg/dL 8.6  8.5  8.9    Chest imaging: CT Chest/Abd/Pelvis 11/21/23 1. New spiculated nodule in the peripheral right apex measuring 0.7 cm. This is nonspecific and may be infectious or inflammatory, however a small metastasis or primary lung malignancy not excluded. Close attention on follow-up. 2. No other evidence of metastatic disease in the chest, abdomen, or pelvis. 3. Emphysema and smoking-related respiratory bronchiolitis. 4. Coronary artery disease. 5. Severe aortic atherosclerosis. 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  PFT:     No data to display          Labs:  Path:  Echo:  Heart Catheterization:       Assessment & Plan:   Chronic obstructive pulmonary disease, unspecified COPD type ([name]) - Plan: albuterol  (VENTOLIN  HFA) 108 (90 Base) MCG/ACT inhaler, ipratropium-albuterol  (DUONEB) 0.5-2.5 (3) MG/3ML SOLN, Ambulatory Referral for DME  Seasonal allergies - Plan: montelukast (SINGULAIR) 10 MG tablet, Ambulatory Referral for DME Assessment and Plan    Chronic obstructive pulmonary disease (COPD) with emphysema - Continue Symbicort 160/4.5 mcg, two puffs twice daily. - Use Ventolin  (albuterol ) as needed. - Order nebulizer machine and refill albuterol  solution. - Schedule pulmonary function tests to confirm COPD and assess severity. - Consider trial of breztri if symbicort not effective after a month trial period  Metastatic Endometrial Cancer to Lung - Oncology following with surveillance CT Scans - Lung nodules noted on last scan in April, with follow up  scan planned for December  Allergic rhinitis (suspected) Seasonal exacerbations of respiratory symptoms suggest allergic rhinitis. - Prescribe montelukast 10 mg, one tablet at bedtime. - Continue over-the-counter allergy medications  as needed.   Follow up in 3 months  Dorn Chill, MD [facility] Office: [phone]   Current Outpatient Medications:    acetaminophen  (TYLENOL ) 500 MG tablet, Take 1,000 mg by mouth every 6 (six) hours as needed for mild pain or headache., Disp: , Rfl:    aspirin  EC 81 MG tablet, Take 1 tablet (81 mg total) by mouth daily. Swallow whole., Disp: , Rfl:    budesonide-formoterol (SYMBICORT) 160-4.5 MCG/ACT inhaler, Inhale 2 puffs into the lungs 2 (two) times daily., Disp: , Rfl:    clopidogrel  (PLAVIX ) 75 MG tablet, Take 75 mg by mouth daily., Disp: , Rfl:    dapagliflozin  propanediol (FARXIGA ) 10 MG TABS tablet, Take 1 tablet (10 mg total) by mouth daily before breakfast., Disp: 90 tablet, Rfl: 3   doxazosin  (CARDURA ) 4 MG tablet, Take 1 tablet (4 mg total) by mouth daily., Disp: 30 tablet, Rfl: 3   furosemide  (LASIX ) 20 MG tablet, Take 1 tablet (20 mg total) by mouth 2 (two) times daily., Disp: 60 tablet, Rfl: 0   hydrALAZINE  (APRESOLINE ) 50 MG tablet, Take 1 tablet (50 mg total) by mouth 3 (three) times daily. HOLD on 8/14-NG, Disp: , Rfl:    levocetirizine (XYZAL) 5 MG tablet, Take 5 mg by mouth every evening., Disp: , Rfl:    lidocaine -prilocaine  (EMLA ) cream, Apply 1 application topically as needed., Disp: 30 g, Rfl: 0   losartan  (COZAAR ) 100 MG tablet, Take 1 tablet by mouth once daily, Disp: 90 tablet, Rfl: 0   metoprolol  succinate (TOPROL  XL) 100 MG 24 hr tablet, Take 1 tablet (100 mg total) by mouth daily., Disp: 30 tablet, Rfl: 3   montelukast (SINGULAIR) 10 MG tablet, Take 1 tablet (10 mg total) by mouth at bedtime., Disp: 30 tablet, Rfl: 11   nitroGLYCERIN  (NITROSTAT ) 0.4 MG SL tablet, Place 1 tablet (0.4 mg total) under the tongue every 5 (five) minutes as needed for chest pain., Disp: 30 tablet, Rfl: 2   polyethylene glycol (MIRALAX ) 17 g packet, Take 17 g by mouth daily. (Patient taking differently: Take 17 g by mouth daily as needed for mild  constipation or moderate constipation.), Disp: 30 each, Rfl: 1   traZODone  (DESYREL ) 50 MG tablet, Take 1 tablet (50 mg total) by mouth at bedtime., Disp: 30 tablet, Rfl: 1   albuterol  (VENTOLIN  HFA) 108 (90 Base) MCG/ACT inhaler, Inhale 2 puffs into the lungs every 4 (four) hours as needed for wheezing or shortness of breath., Disp: 8 g, Rfl: 11   ipratropium-albuterol  (DUONEB) 0.5-2.5 (3) MG/3ML SOLN, Take 3 mLs by nebulization every 6 (six) hours as needed., Disp: 360 mL, Rfl: 3

## 2024-05-19 NOTE — Patient Instructions (Addendum)
 Continue symbicort 2 puffs twice daily - rinse mouth out after each use  Use albuterol  inhaler or nebulizer treatment every 4-6 hours as needed  Start montelukast 10mg  daily for your breathing and allergies  Message me after a month of using symbicort if you still have breathing issues and want to try the next level of inhaler therapy  Schedule breathing tests at the front desk  Follow up in 3 months

## 2024-05-23 LAB — COLOGUARD

## 2024-06-11 ENCOUNTER — Other Ambulatory Visit: Payer: Self-pay | Admitting: Hematology and Oncology

## 2024-06-16 ENCOUNTER — Ambulatory Visit: Admitting: Podiatry

## 2024-06-19 ENCOUNTER — Encounter: Payer: Self-pay | Admitting: Hematology and Oncology

## 2024-06-19 ENCOUNTER — Inpatient Hospital Stay (HOSPITAL_BASED_OUTPATIENT_CLINIC_OR_DEPARTMENT_OTHER): Admitting: Hematology and Oncology

## 2024-06-19 ENCOUNTER — Inpatient Hospital Stay: Attending: Hematology and Oncology

## 2024-06-19 ENCOUNTER — Inpatient Hospital Stay

## 2024-06-19 VITALS — BP 112/74 | HR 87 | Temp 97.5°F | Resp 18 | Ht 63.0 in | Wt 187.6 lb

## 2024-06-19 VITALS — BP 125/75 | HR 86 | Temp 98.4°F | Resp 20

## 2024-06-19 DIAGNOSIS — C541 Malignant neoplasm of endometrium: Secondary | ICD-10-CM

## 2024-06-19 DIAGNOSIS — C7802 Secondary malignant neoplasm of left lung: Secondary | ICD-10-CM | POA: Diagnosis not present

## 2024-06-19 DIAGNOSIS — Z5112 Encounter for antineoplastic immunotherapy: Secondary | ICD-10-CM | POA: Insufficient documentation

## 2024-06-19 DIAGNOSIS — C7801 Secondary malignant neoplasm of right lung: Secondary | ICD-10-CM | POA: Insufficient documentation

## 2024-06-19 DIAGNOSIS — Z79620 Long term (current) use of immunosuppressive biologic: Secondary | ICD-10-CM | POA: Insufficient documentation

## 2024-06-19 DIAGNOSIS — N1330 Unspecified hydronephrosis: Secondary | ICD-10-CM | POA: Diagnosis not present

## 2024-06-19 DIAGNOSIS — Z23 Encounter for immunization: Secondary | ICD-10-CM

## 2024-06-19 LAB — CBC WITH DIFFERENTIAL (CANCER CENTER ONLY)
Abs Immature Granulocytes: 0.01 K/uL (ref 0.00–0.07)
Basophils Absolute: 0 K/uL (ref 0.0–0.1)
Basophils Relative: 0 %
Eosinophils Absolute: 0.3 K/uL (ref 0.0–0.5)
Eosinophils Relative: 4 %
HCT: 39.6 % (ref 36.0–46.0)
Hemoglobin: 13 g/dL (ref 12.0–15.0)
Immature Granulocytes: 0 %
Lymphocytes Relative: 34 %
Lymphs Abs: 2 K/uL (ref 0.7–4.0)
MCH: 28.7 pg (ref 26.0–34.0)
MCHC: 32.8 g/dL (ref 30.0–36.0)
MCV: 87.4 fL (ref 80.0–100.0)
Monocytes Absolute: 0.6 K/uL (ref 0.1–1.0)
Monocytes Relative: 9 %
Neutro Abs: 3.1 K/uL (ref 1.7–7.7)
Neutrophils Relative %: 53 %
Platelet Count: 242 K/uL (ref 150–400)
RBC: 4.53 MIL/uL (ref 3.87–5.11)
RDW: 17.2 % — ABNORMAL HIGH (ref 11.5–15.5)
WBC Count: 6 K/uL (ref 4.0–10.5)
nRBC: 0 % (ref 0.0–0.2)

## 2024-06-19 LAB — CMP (CANCER CENTER ONLY)
ALT: 15 U/L (ref 0–44)
AST: 17 U/L (ref 15–41)
Albumin: 3.8 g/dL (ref 3.5–5.0)
Alkaline Phosphatase: 41 U/L (ref 38–126)
Anion gap: 8 (ref 5–15)
BUN: 25 mg/dL — ABNORMAL HIGH (ref 6–20)
CO2: 29 mmol/L (ref 22–32)
Calcium: 8.9 mg/dL (ref 8.9–10.3)
Chloride: 105 mmol/L (ref 98–111)
Creatinine: 1.41 mg/dL — ABNORMAL HIGH (ref 0.44–1.00)
GFR, Estimated: 43 mL/min — ABNORMAL LOW (ref >60)
Glucose, Bld: 105 mg/dL — ABNORMAL HIGH (ref 70–99)
Potassium: 4 mmol/L (ref 3.5–5.1)
Sodium: 142 mmol/L (ref 135–145)
Total Bilirubin: 0.4 mg/dL (ref 0.0–1.2)
Total Protein: 7.1 g/dL (ref 6.5–8.1)

## 2024-06-19 LAB — TSH: TSH: 1.57 u[IU]/mL (ref 0.350–4.500)

## 2024-06-19 MED ORDER — ALTEPLASE 2 MG IJ SOLR
2.0000 mg | Freq: Once | INTRAMUSCULAR | Status: AC
  Administered 2024-06-19: 2 mg
  Filled 2024-06-19: qty 2

## 2024-06-19 MED ORDER — SODIUM CHLORIDE 0.9 % IV SOLN: Freq: Once | INTRAVENOUS | Status: AC

## 2024-06-19 MED ORDER — INFLUENZA VIRUS VACC SPLIT PF (FLUZONE) 0.5 ML IM SUSY
0.5000 mL | PREFILLED_SYRINGE | Freq: Once | INTRAMUSCULAR | Status: AC
  Administered 2024-06-19: 0.5 mL via INTRAMUSCULAR
  Filled 2024-06-19: qty 0.5

## 2024-06-19 MED ORDER — SODIUM CHLORIDE 0.9 % IV SOLN
1000.0000 mg | Freq: Once | INTRAVENOUS | Status: AC
  Administered 2024-06-19: 1000 mg via INTRAVENOUS
  Filled 2024-06-19: qty 20

## 2024-06-19 MED ORDER — FUROSEMIDE 20 MG PO TABS: 20.0000 mg | ORAL_TABLET | Freq: Two times a day (BID) | ORAL | 0 refills | Status: AC

## 2024-06-19 NOTE — Assessment & Plan Note (Addendum)
This is stable Her renal function is stable Observe closely

## 2024-06-19 NOTE — Patient Instructions (Signed)
 [facility] - A DEPT OF [facility]  Discharge Instructions: Thank you for choosing [facility] to provide your oncology and hematology care.   If you have a lab appointment with the Cancer Center, please go directly to the Cancer Center and check in at the registration area.   Wear comfortable clothing and clothing appropriate for easy access to any Portacath or PICC line.   We strive to give you quality time with your provider. You may need to reschedule your appointment if you arrive late (15 or more minutes).  Arriving late affects you and other patients whose appointments are after yours.  Also, if you miss three or more appointments without notifying the office, you may be dismissed from the clinic at the provider's discretion.      For prescription refill requests, have your pharmacy contact our office and allow 72 hours for refills to be completed.    Today you received the following chemotherapy and/or immunotherapy agents jemperli       To help prevent nausea and vomiting after your treatment, we encourage you to take your nausea medication as directed.  BELOW ARE SYMPTOMS THAT SHOULD BE REPORTED IMMEDIATELY: *FEVER GREATER THAN 100.4 F (38 C) OR HIGHER *CHILLS OR SWEATING *NAUSEA AND VOMITING THAT IS NOT CONTROLLED WITH YOUR NAUSEA MEDICATION *UNUSUAL SHORTNESS OF BREATH *UNUSUAL BRUISING OR BLEEDING *URINARY PROBLEMS (pain or burning when urinating, or frequent urination) *BOWEL PROBLEMS (unusual diarrhea, constipation, pain near the anus) TENDERNESS IN MOUTH AND THROAT WITH OR WITHOUT PRESENCE OF ULCERS (sore throat, sores in mouth, or a toothache) UNUSUAL RASH, SWELLING OR PAIN  UNUSUAL VAGINAL DISCHARGE OR ITCHING   Items with * indicate a potential emergency and should be followed up as soon as possible or go to the Emergency Department if any problems should occur.  Please show the CHEMOTHERAPY ALERT CARD or IMMUNOTHERAPY  ALERT CARD at check-in to the Emergency Department and triage nurse.  Should you have questions after your visit or need to cancel or reschedule your appointment, please contact [facility] - A DEPT OF [facility]  Dept: [phone]  and follow the prompts.  Office hours are 8:00 a.m. to 4:30 p.m. Monday - Friday. Please note that voicemails left after 4:00 p.m. may not be returned until the following business day.  We are closed weekends and major holidays. You have access to a nurse at all times for urgent questions. Please call the main number to the clinic Dept: [phone] and follow the prompts.   For any non-urgent questions, you may also contact your provider using MyChart. We now offer e-Visits for anyone 34 and older to request care online for non-urgent symptoms. For details visit [email or web address].   Also download the MyChart app! Go to the app store, search MyChart, open the app, select [place], and log in with your MyChart username and password.  Influenza A (H5N1) Vaccine Injection What is this medication? INFLUENZA A (H5N1) VACCINE (in floo EN zuh A [H5N1] vak SEEN) reduces the risk of the influenza A (H5N1). It does not treat influenza A (H5N1). It is still possible to get influenza A (H5N1) after receiving this vaccine, but the symptoms may be less severe or not last as long. It works by helping your immune system learn how to fight off a future infection. This medicine may be used for other purposes; ask your health care provider or pharmacist if  you have questions. COMMON BRAND NAME(S): Marcelyn What should I tell my care team before I take this medication? They need to know if you have any of these conditions: Bleeding disorder Fever or infection History of fainting Nervous system conditions, such as Guillain-Barre syndrome Weakened immune system An unusual or allergic reaction to influenza A (H5N1) vaccine, other vaccines,  medications, foods, dyes, or preservatives Pregnant or trying to get pregnant Breastfeeding How should I use this medication? This vaccine is injected into a muscle. It is given by your care team. This vaccine requires 2 doses to get the full benefit. Set a reminder for when your next dose is due. A copy of Vaccine Information Statements will be given before each vaccination. Be sure to read this information carefully each time. This sheet may change often. Talk to your care team to see which vaccines are right for you. Some vaccines should not be used in all age groups. Overdosage: If you think you have taken too much of this medicine contact a poison control center or emergency room at once. NOTE: This medicine is only for you. Do not share this medicine with others. What if I miss a dose? Keep appointments for follow-up doses. It is important not to miss your dose. Call your care team if you are unable to keep an appointment. What may interact with this medication? Medications that lower your chance of fighting infection This list may not describe all possible interactions. Give your health care provider a list of all the medicines, herbs, non-prescription drugs, or dietary supplements you use. Also tell them if you smoke, drink alcohol, or use illegal drugs. Some items may interact with your medicine. What should I watch for while using this medication? Visit your care team for regular health checks. Before you receive this vaccine, talk to your care team if you have an acute illness. Vaccines can be given to people with mild acute illness, such as the common cold or diarrhea. Discuss with your care team the risks and benefits of receiving this vaccine during a moderate to severe illness. Your care team may choose to wait to give you the vaccine when you feel better. Report any side effects to your care team or to the Vaccine Adverse Event Reporting System (VAERS) website at  https://vaers.lagents.no. This is only for reporting side effects; VAERS staff do not give medical advice. What side effects may I notice from receiving this medication? Side effects that you should report to your care team as soon as possible: Allergic reactions--skin rash, itching, hives, swelling of the face, lips, tongue, or throat Feeling faint or lightheaded Side effects that usually do not require medical attention (report these to your care team if they continue or are bothersome): General discomfort and fatigue Headache Muscle pain Pain, redness, or irritation at injection site This list may not describe all possible side effects. Call your doctor for medical advice about side effects. You may report side effects to FDA at 1-800-FDA-1088. Where should I keep my medication? This vaccine is only given by your care team. It will not be stored at home. NOTE: This sheet is a summary. It may not cover all possible information. If you have questions about this medicine, talk to your doctor, pharmacist, or health care provider.  2025 Elsevier/Gold Standard (2023-08-27 00:00:00)

## 2024-06-19 NOTE — Progress Notes (Signed)
 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 Final pathology: High grade serous MSI stable, Her2/neu 3+, Er/PR neg  She received palliative chemotherapy with combination of carboplatin , paclitaxel  and trastuzumab  with great response, trastuzumab  was discontinue in September 2023 when she developed reduced ejection fraction on echocardiogram which has since resolved In March 2024, she is noted to have relapsed disease and was treated with combination of carboplatin , paclitaxel  and dostarlimab   Imaging study from August 2025 showed no evidence of active disease we will continue treatment without delay  I plan to repeat imaging study again in December Malignant neoplasm metastatic to both lungs [facility]) She has quit smoking We will continue CT imaging of the chest monitoring next month due to metastatic disease in her chest Hydronephrosis of right kidney This is stable Her renal function is stable Observe closely  Orders Placed This Encounter  Procedures   CT CHEST ABDOMEN PELVIS WO CONTRAST    Standing Status:   Future    Expected Date:   07/24/2024    Expiration Date:   06/19/2025    Is patient pregnant?:   No    Preferred imaging location?:   [facility]    If indicated for the ordered procedure, I authorize the administration of oral contrast media per Radiology protocol:   No    Reason for no oral contrast::   no need oral contrast     Hicks Lonn, MD  INTERVAL HISTORY: she returns for treatment follow-up Complications related to  previous cycle of chemotherapy included elevated serum creatinine  PHYSICAL EXAMINATION: ECOG PERFORMANCE STATUS: 1 - Symptomatic but completely ambulatory  Lab Results  Component Value Date   CAN125 11.5 03/27/2024   CAN125 145.0 (H) 01/29/2022      Latest Ref Rng & Units 06/19/2024   12:38 PM 05/08/2024   12:02 PM 03/27/2024   11:37 AM  CBC  WBC 4.0 - 10.5 K/uL 6.0  6.0  6.4   Hemoglobin 12.0 - 15.0 g/dL 86.9  87.5  87.3   Hematocrit 36.0 - 46.0 % 39.6  37.4  38.2   Platelets 150 - 400 K/uL 242  211  201       Chemistry      Component Value Date/Time   NA 142 06/19/2024 1238   NA 141 02/25/2019 1222   K 4.0 06/19/2024 1238   CL 105 06/19/2024 1238   CO2 29 06/19/2024 1238   BUN 25 (H) 06/19/2024 1238   BUN 17 02/25/2019 1222   CREATININE 1.41 (H) 06/19/2024 1238      Component Value Date/Time   CALCIUM  8.9 06/19/2024 1238   ALKPHOS 41 06/19/2024 1238   AST 17 06/19/2024 1238   ALT 15 06/19/2024 1238   BILITOT 0.4 06/19/2024 1238       Vitals:   06/19/24 1303  BP: 112/74  Pulse: 87  Resp: 18  Temp: (!) 97.5 F (36.4 C)  SpO2: 100%   Filed Weights   06/19/24 1303  Weight: 187 lb 9.6 oz (85.1 kg)   Other relevant data reviewed during this visit included  CBC, CMP

## 2024-06-19 NOTE — Assessment & Plan Note (Addendum)
 She has quit smoking We will continue CT imaging of the chest monitoring next month due to metastatic disease in her chest

## 2024-06-19 NOTE — Assessment & Plan Note (Addendum)
 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 Final pathology: High grade serous MSI stable, Her2/neu 3+, Er/PR neg  She received palliative chemotherapy with combination of carboplatin , paclitaxel  and trastuzumab  with great response, trastuzumab  was discontinue in September 2023 when she developed reduced ejection fraction on echocardiogram which has since resolved In March 2024, she is noted to have relapsed disease and was treated with combination of carboplatin , paclitaxel  and dostarlimab   Imaging study from August 2025 showed no evidence of active disease we will continue treatment without delay  I plan to repeat imaging study again in December

## 2024-06-20 LAB — T4: T4, Total: 6.8 ug/dL (ref 4.5–12.0)

## 2024-06-20 LAB — CA 125: Cancer Antigen (CA) 125: 10.1 U/mL (ref 0.0–38.1)

## 2024-07-22 ENCOUNTER — Ambulatory Visit: Admitting: Podiatry

## 2024-07-24 ENCOUNTER — Ambulatory Visit (HOSPITAL_COMMUNITY): Admission: RE | Admit: 2024-07-24 | Discharge: 2024-07-24 | Attending: Hematology and Oncology

## 2024-07-24 DIAGNOSIS — C541 Malignant neoplasm of endometrium: Secondary | ICD-10-CM | POA: Insufficient documentation

## 2024-07-29 ENCOUNTER — Ambulatory Visit: Admitting: Podiatry

## 2024-07-29 ENCOUNTER — Encounter: Payer: Self-pay | Admitting: Podiatry

## 2024-07-29 DIAGNOSIS — D689 Coagulation defect, unspecified: Secondary | ICD-10-CM | POA: Diagnosis not present

## 2024-07-29 DIAGNOSIS — E1142 Type 2 diabetes mellitus with diabetic polyneuropathy: Secondary | ICD-10-CM | POA: Diagnosis not present

## 2024-07-29 DIAGNOSIS — B351 Tinea unguium: Secondary | ICD-10-CM

## 2024-07-29 DIAGNOSIS — D2371 Other benign neoplasm of skin of right lower limb, including hip: Secondary | ICD-10-CM

## 2024-07-29 DIAGNOSIS — M79675 Pain in left toe(s): Secondary | ICD-10-CM

## 2024-07-29 DIAGNOSIS — D2372 Other benign neoplasm of skin of left lower limb, including hip: Secondary | ICD-10-CM | POA: Diagnosis not present

## 2024-07-29 DIAGNOSIS — M79674 Pain in right toe(s): Secondary | ICD-10-CM

## 2024-07-29 NOTE — Progress Notes (Signed)
 Subjective:  Patient ID: Krista [name], female    DOB: April 12, 1961,  MRN: [id number] HPI Chief Complaint  Patient presents with   Debridement    Requesting toenail trimming, calluses/corns, feet are sore cause toenails are long, diabetic - 5.7   New Patient (Initial Visit)    63 y.o. female presents with the above complaint.   ROS: Denies fever chills nausea mobic muscle aches pains calf pain back pain chest pain shortness of breath.  Past Medical History:  Diagnosis Date   Arthritis    CAD (coronary artery disease)    Depression    Diabetes mellitus    Hypertension    NSTEMI (non-ST elevated myocardial infarction) [facility])    Past Surgical History:  Procedure Laterality Date   ANKLE SURGERY     left ankle - was broken   CORONARY STENT INTERVENTION N/A 12/16/2018   Procedure: CORONARY STENT INTERVENTION;  Surgeon: Anner Alm ORN, MD;  Location[facility] INVASIVE CV LAB;  Service: Cardiovascular;  Laterality: N/A;  LAD   IR IMAGING GUIDED PORT INSERTION  01/31/2022   IR REMOVAL TUN ACCESS W/ PORT W/O FL MOD SED  01/31/2022   LEFT HEART CATH AND CORONARY ANGIOGRAPHY N/A 12/16/2018   Procedure: LEFT HEART CATH AND CORONARY ANGIOGRAPHY;  Surgeon: Anner Alm ORN, MD;  Location: [facility] INVASIVE CV LAB;  Service: Cardiovascular;  Laterality: N/A;   Current Medications[1]  Allergies[2] Review of Systems Objective:  There were no vitals filed for this visit.  General: Well developed, nourished, in no acute distress, alert and oriented x3   Dermatological: Skin is warm, dry and supple bilateral.  Nails 1 through 10 are thick yellow dystrophic discolored clinically mycotic.  remaining integument appears unremarkable at this time. There are no open sores, no preulcerative lesions, no rash or signs of infection present.  Mild benign skin lesions plantar aspect of the bilateral foot porokeratotic in nature.  No open wounds or lesions.  Vascular: Dorsalis Pedis artery and Posterior Tibial artery  pedal pulses are 2/4 bilateral with immedate capillary fill time. Pedal hair growth present. No varicosities and no lower extremity edema present bilateral.   Neruologic: Grossly intact via light touch bilateral. Vibratory intact via tuning fork bilateral. Protective threshold with Semmes Wienstein monofilament intact to all pedal sites bilateral. Patellar and Achilles deep tendon reflexes 2+ bilateral. No Babinski or clonus noted bilateral.   Musculoskeletal: No gross boney pedal deformities bilateral. No pain, crepitus, or limitation noted with foot and ankle range of motion bilateral. Muscular strength 5/5 in all groups tested bilateral.  Mild flexible hammertoe deformities are only pedal deformities present with mild bunion deformities.  Gait: Unassisted, Nonantalgic.    Radiographs:  None taken  Assessment & Plan:   Assessment: Pain limp secondary to onychomycosis diabetes mellitus without complications.  Hallux valgus deformity hammertoe deformities noted bilateral asymptomatic.  Benign skin lesions present.  Plan: Debrided toenails 1 through 5 bilaterally debrided benign skin lesions discussed diabetic footcare.     [name] T. [name], DPM    [1]  Current Outpatient Medications:    hydrochlorothiazide  (HYDRODIURIL ) 25 MG tablet, Take 25 mg by mouth., Disp: , Rfl:    acetaminophen  (TYLENOL ) 500 MG tablet, Take 1,000 mg by mouth every 6 (six) hours as needed for mild pain or headache., Disp: , Rfl:    albuterol  (VENTOLIN  HFA) 108 (90 Base) MCG/ACT inhaler, Inhale 2 puffs into the lungs every 4 (four) hours as needed for wheezing or shortness of breath., Disp: 8 g, Rfl: 11  aspirin  EC 81 MG tablet, Take 1 tablet (81 mg total) by mouth daily. Swallow whole., Disp: , Rfl:    budesonide-formoterol (SYMBICORT) 160-4.5 MCG/ACT inhaler, Inhale 2 puffs into the lungs 2 (two) times daily., Disp: , Rfl:    clopidogrel  (PLAVIX ) 75 MG tablet, Take 75 mg by mouth daily., Disp: , Rfl:     dapagliflozin  propanediol (FARXIGA ) 10 MG TABS tablet, Take 1 tablet (10 mg total) by mouth daily before breakfast., Disp: 90 tablet, Rfl: 3   doxazosin  (CARDURA ) 4 MG tablet, Take 1 tablet (4 mg total) by mouth daily., Disp: 30 tablet, Rfl: 3   furosemide  (LASIX ) 20 MG tablet, Take 1 tablet (20 mg total) by mouth 2 (two) times daily., Disp: 60 tablet, Rfl: 0   hydrALAZINE  (APRESOLINE ) 50 MG tablet, Take 1 tablet (50 mg total) by mouth 3 (three) times daily. HOLD on 8/14-NG, Disp: , Rfl:    ipratropium-albuterol  (DUONEB) 0.5-2.5 (3) MG/3ML SOLN, Take 3 mLs by nebulization every 6 (six) hours as needed., Disp: 360 mL, Rfl: 3   levocetirizine (XYZAL) 5 MG tablet, Take 5 mg by mouth every evening., Disp: , Rfl:    lidocaine -prilocaine  (EMLA ) cream, Apply 1 application topically as needed., Disp: 30 g, Rfl: 0   losartan  (COZAAR ) 100 MG tablet, Take 1 tablet by mouth once daily, Disp: 90 tablet, Rfl: 0   metoprolol  succinate (TOPROL  XL) 100 MG 24 hr tablet, Take 1 tablet (100 mg total) by mouth daily., Disp: 30 tablet, Rfl: 3   montelukast  (SINGULAIR ) 10 MG tablet, Take 1 tablet (10 mg total) by mouth at bedtime., Disp: 30 tablet, Rfl: 11   nitroGLYCERIN  (NITROSTAT ) 0.4 MG SL tablet, Place 1 tablet (0.4 mg total) under the tongue every 5 (five) minutes as needed for chest pain., Disp: 30 tablet, Rfl: 2   polyethylene glycol (MIRALAX ) 17 g packet, Take 17 g by mouth daily. (Patient taking differently: Take 17 g by mouth daily as needed for mild constipation or moderate constipation.), Disp: 30 each, Rfl: 1   SYMBICORT 80-4.5 MCG/ACT inhaler, SMARTSIG:2 Puff(s) By Mouth Twice Daily, Disp: , Rfl:    traZODone  (DESYREL ) 50 MG tablet, TAKE 1 TABLET BY MOUTH AT BEDTIME, Disp: 30 tablet, Rfl: 0 [2]  Allergies Allergen Reactions   Statins Itching    itching itching   Agrostis Alba Pollen Extract [Gramineae Pollens]    Zetia [Ezetimibe] Itching

## 2024-07-31 ENCOUNTER — Inpatient Hospital Stay: Attending: Hematology and Oncology

## 2024-07-31 ENCOUNTER — Encounter: Payer: Self-pay | Admitting: Hematology and Oncology

## 2024-07-31 ENCOUNTER — Inpatient Hospital Stay: Admitting: Hematology and Oncology

## 2024-07-31 ENCOUNTER — Inpatient Hospital Stay

## 2024-07-31 VITALS — BP 108/67 | HR 74 | Temp 98.7°F | Resp 20 | Wt 182.5 lb

## 2024-07-31 DIAGNOSIS — C541 Malignant neoplasm of endometrium: Secondary | ICD-10-CM

## 2024-07-31 DIAGNOSIS — Z79620 Long term (current) use of immunosuppressive biologic: Secondary | ICD-10-CM | POA: Insufficient documentation

## 2024-07-31 DIAGNOSIS — N183 Chronic kidney disease, stage 3 unspecified: Secondary | ICD-10-CM

## 2024-07-31 DIAGNOSIS — I10 Essential (primary) hypertension: Secondary | ICD-10-CM

## 2024-07-31 DIAGNOSIS — C7801 Secondary malignant neoplasm of right lung: Secondary | ICD-10-CM

## 2024-07-31 DIAGNOSIS — C7802 Secondary malignant neoplasm of left lung: Secondary | ICD-10-CM

## 2024-07-31 DIAGNOSIS — Z5112 Encounter for antineoplastic immunotherapy: Secondary | ICD-10-CM | POA: Diagnosis present

## 2024-07-31 LAB — CBC WITH DIFFERENTIAL (CANCER CENTER ONLY)
Abs Immature Granulocytes: 0.03 K/uL (ref 0.00–0.07)
Basophils Absolute: 0 K/uL (ref 0.0–0.1)
Basophils Relative: 0 %
Eosinophils Absolute: 0.2 K/uL (ref 0.0–0.5)
Eosinophils Relative: 2 %
HCT: 37.4 % (ref 36.0–46.0)
Hemoglobin: 12.5 g/dL (ref 12.0–15.0)
Immature Granulocytes: 0 %
Lymphocytes Relative: 41 %
Lymphs Abs: 3.5 K/uL (ref 0.7–4.0)
MCH: 28.2 pg (ref 26.0–34.0)
MCHC: 33.4 g/dL (ref 30.0–36.0)
MCV: 84.4 fL (ref 80.0–100.0)
Monocytes Absolute: 0.8 K/uL (ref 0.1–1.0)
Monocytes Relative: 9 %
Neutro Abs: 4 K/uL (ref 1.7–7.7)
Neutrophils Relative %: 48 %
Platelet Count: 297 K/uL (ref 150–400)
RBC: 4.43 MIL/uL (ref 3.87–5.11)
RDW: 15.9 % — ABNORMAL HIGH (ref 11.5–15.5)
WBC Count: 8.4 K/uL (ref 4.0–10.5)
nRBC: 0 % (ref 0.0–0.2)

## 2024-07-31 LAB — CMP (CANCER CENTER ONLY)
ALT: 22 U/L (ref 0–44)
AST: 26 U/L (ref 15–41)
Albumin: 4 g/dL (ref 3.5–5.0)
Alkaline Phosphatase: 52 U/L (ref 38–126)
Anion gap: 12 (ref 5–15)
BUN: 39 mg/dL — ABNORMAL HIGH (ref 8–23)
CO2: 26 mmol/L (ref 22–32)
Calcium: 9.2 mg/dL (ref 8.9–10.3)
Chloride: 100 mmol/L (ref 98–111)
Creatinine: 1.43 mg/dL — ABNORMAL HIGH (ref 0.44–1.00)
GFR, Estimated: 42 mL/min — ABNORMAL LOW (ref >60)
Glucose, Bld: 141 mg/dL — ABNORMAL HIGH (ref 70–99)
Potassium: 3.5 mmol/L (ref 3.5–5.1)
Sodium: 138 mmol/L (ref 135–145)
Total Bilirubin: 0.3 mg/dL (ref 0.0–1.2)
Total Protein: 7.3 g/dL (ref 6.5–8.1)

## 2024-07-31 LAB — TSH: TSH: 1.02 u[IU]/mL (ref 0.350–4.500)

## 2024-07-31 MED ORDER — SODIUM CHLORIDE 0.9 % IV SOLN
1000.0000 mg | Freq: Once | INTRAVENOUS | Status: AC
  Administered 2024-07-31: 13:00:00 1000 mg via INTRAVENOUS
  Filled 2024-07-31: qty 20

## 2024-07-31 MED ORDER — SODIUM CHLORIDE 0.9 % IV SOLN: Freq: Once | INTRAVENOUS | Status: AC

## 2024-07-31 MED ORDER — DOXAZOSIN MESYLATE 4 MG PO TABS: 4.0000 mg | ORAL_TABLET | Freq: Every day | ORAL | 3 refills | Status: AC

## 2024-07-31 NOTE — Progress Notes (Signed)
 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 Final pathology: High grade serous MSI stable, Her2/neu 3+, Er/PR neg  She received palliative chemotherapy with combination of carboplatin , paclitaxel  and trastuzumab  with great response, trastuzumab  was discontinue in September 2023 when she developed reduced ejection fraction on echocardiogram which has since resolved In March 2024, she is noted to have relapsed disease and was treated with combination of carboplatin , paclitaxel  and dostarlimab   I have reviewed imaging study from December 2025 which showed no evidence of disease The patient is doing well with maintenance immunotherapy with dostarlimab  only we will continue treatment without delay  I plan to repeat imaging study again in April 2026 Malignant neoplasm metastatic to both lungs [facility])  Stage 3 chronic kidney disease, unspecified whether stage 3a or 3b CKD ([name]) She has intermittent acute on chronic renal failure We discussed importance of hydration and risk factor modification Essential hypertension Her blood pressure control at home is satisfactory I refill her prescription blood pressure medication today  Orders Placed This Encounter  Procedures   CBC with Differential (Cancer Center Only)    Standing Status:   Future    Expected Date:   09/11/2024    Expiration Date:   09/11/2025   CMP (Cancer Center only)    Standing Status:   Future    Expected Date:   09/11/2024    Expiration Date:   09/11/2025   T4    Standing Status:   Future     Expected Date:   09/11/2024    Expiration Date:   09/11/2025   TSH    Standing Status:   Future    Expected Date:   09/11/2024    Expiration Date:   09/11/2025   CBC with Differential (Cancer Center Only)    Standing Status:   Future    Expected Date:   10/23/2024    Expiration Date:   10/23/2025   CMP (Cancer Center only)    Standing Status:   Future    Expected Date:   10/23/2024    Expiration Date:   10/23/2025   T4    Standing Status:   Future    Expected Date:   10/23/2024    Expiration Date:   10/23/2025   TSH    Standing Status:   Future    Expected Date:   10/23/2024    Expiration Date:   10/23/2025   CBC with Differential (Cancer Center Only)    Standing Status:   Future    Expected Date:   12/04/2024    Expiration Date:   12/04/2025   CMP (Cancer Center only)    Standing Status:   Future    Expected Date:   12/04/2024    Expiration Date:   12/04/2025   T4    Standing Status:   Future    Expected Date:   12/04/2024    Expiration Date:   12/04/2025   TSH    Standing Status:   Future    Expected Date:   12/04/2024    Expiration Date:   12/04/2025  Almarie Bedford, MD  INTERVAL HISTORY: she returns for treatment follow-up Complications related to previous cycle of chemotherapy included elevated BP and elevated serum creatinine She is here accompanied by her mother Her recent imaging study showed no evidence of cancer recurrence So far, she tolerated treatment very well  PHYSICAL EXAMINATION: ECOG PERFORMANCE STATUS: 0 - Asymptomatic  Lab Results  Component Value Date   CAN125 10.1 06/19/2024   CAN125 11.5 03/27/2024   CAN125 145.0 (H) 01/29/2022      Latest Ref Rng & Units 07/31/2024   11:04 AM 06/19/2024   12:38 PM 05/08/2024   12:02 PM  CBC  WBC 4.0 - 10.5 K/uL 8.4  6.0  6.0   Hemoglobin 12.0 - 15.0 g/dL 87.4  86.9  87.5   Hematocrit 36.0 - 46.0 % 37.4  39.6  37.4   Platelets 150 - 400 K/uL 297  242  211       Chemistry      Component Value Date/Time   NA 138  07/31/2024 1104   NA 141 02/25/2019 1222   K 3.5 07/31/2024 1104   CL 100 07/31/2024 1104   CO2 26 07/31/2024 1104   BUN 39 (H) 07/31/2024 1104   BUN 17 02/25/2019 1222   CREATININE 1.43 (H) 07/31/2024 1104      Component Value Date/Time   CALCIUM  9.2 07/31/2024 1104   ALKPHOS 52 07/31/2024 1104   AST 26 07/31/2024 1104   ALT 22 07/31/2024 1104   BILITOT 0.3 07/31/2024 1104       There were no vitals filed for this visit. There were no vitals filed for this visit. Other relevant data reviewed during this visit included CBC and CMP

## 2024-07-31 NOTE — Patient Instructions (Signed)
 [facility] - A DEPT OF [place]  Discharge Instructions: Thank you for choosing [facility] to provide your oncology and hematology care.   If you have a lab appointment with the Cancer Center, please go directly to the Cancer Center and check in at the registration area.   Wear comfortable clothing and clothing appropriate for easy access to any Portacath or PICC line.   We strive to give you quality time with your provider. You may need to reschedule your appointment if you arrive late (15 or more minutes).  Arriving late affects you and other patients whose appointments are after yours.  Also, if you miss three or more appointments without notifying the office, you may be dismissed from the clinic at the provider's discretion.      For prescription refill requests, have your pharmacy contact our office and allow 72 hours for refills to be completed.    Today you received the following chemotherapy and/or immunotherapy agents: Dostarlimab -gxly (Jemperli )    To help prevent nausea and vomiting after your treatment, we encourage you to take your nausea medication as directed.  BELOW ARE SYMPTOMS THAT SHOULD BE REPORTED IMMEDIATELY: *FEVER GREATER THAN 100.4 F (38 C) OR HIGHER *CHILLS OR SWEATING *NAUSEA AND VOMITING THAT IS NOT CONTROLLED WITH YOUR NAUSEA MEDICATION *UNUSUAL SHORTNESS OF BREATH *UNUSUAL BRUISING OR BLEEDING *URINARY PROBLEMS (pain or burning when urinating, or frequent urination) *BOWEL PROBLEMS (unusual diarrhea, constipation, pain near the anus) TENDERNESS IN MOUTH AND THROAT WITH OR WITHOUT PRESENCE OF ULCERS (sore throat, sores in mouth, or a toothache) UNUSUAL RASH, SWELLING OR PAIN  UNUSUAL VAGINAL DISCHARGE OR ITCHING   Items with * indicate a potential emergency and should be followed up as soon as possible or go to the Emergency Department if any problems should occur.  Please show the CHEMOTHERAPY ALERT CARD  or IMMUNOTHERAPY ALERT CARD at check-in to the Emergency Department and triage nurse.  Should you have questions after your visit or need to cancel or reschedule your appointment, please contact [facility] - A DEPT OF [facility]  Dept: [phone]  and follow the prompts.  Office hours are 8:00 a.m. to 4:30 p.m. Monday - Friday. Please note that voicemails left after 4:00 p.m. may not be returned until the following business day.  We are closed weekends and major holidays. You have access to a nurse at all times for urgent questions. Please call the main number to the clinic Dept: [phone] and follow the prompts.   For any non-urgent questions, you may also contact your provider using MyChart. We now offer e-Visits for anyone 59 and older to request care online for non-urgent symptoms. For details visit [email or web address].   Also download the MyChart app! Go to the app store, search MyChart, open the app, select [place], and log in with your MyChart username and password.

## 2024-07-31 NOTE — Assessment & Plan Note (Addendum)
 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 Final pathology: High grade serous MSI stable, Her2/neu 3+, Er/PR neg  She received palliative chemotherapy with combination of carboplatin , paclitaxel  and trastuzumab  with great response, trastuzumab  was discontinue in September 2023 when she developed reduced ejection fraction on echocardiogram which has since resolved In March 2024, she is noted to have relapsed disease and was treated with combination of carboplatin , paclitaxel  and dostarlimab   I have reviewed imaging study from December 2025 which showed no evidence of disease The patient is doing well with maintenance immunotherapy with dostarlimab  only we will continue treatment without delay  I plan to repeat imaging study again in April 2026

## 2024-07-31 NOTE — Assessment & Plan Note (Addendum)
 Her blood pressure control at home is satisfactory I refill her prescription blood pressure medication today

## 2024-07-31 NOTE — Assessment & Plan Note (Addendum)
She has intermittent acute on chronic renal failure We discussed importance of hydration and risk factor modification 

## 2024-08-01 LAB — CA 125: Cancer Antigen (CA) 125: 9 U/mL (ref 0.0–38.1)

## 2024-08-01 LAB — T4: T4, Total: 6.3 ug/dL (ref 4.5–12.0)

## 2024-08-22 LAB — COLOGUARD

## 2024-09-08 LAB — COLOGUARD

## 2024-09-11 ENCOUNTER — Other Ambulatory Visit: Payer: Self-pay | Admitting: Hematology and Oncology

## 2024-09-11 ENCOUNTER — Inpatient Hospital Stay: Attending: Hematology and Oncology

## 2024-09-11 ENCOUNTER — Inpatient Hospital Stay

## 2024-09-11 ENCOUNTER — Inpatient Hospital Stay: Admitting: Hematology and Oncology

## 2024-09-18 ENCOUNTER — Telehealth: Payer: Self-pay

## 2024-09-18 ENCOUNTER — Inpatient Hospital Stay

## 2024-09-18 ENCOUNTER — Inpatient Hospital Stay: Admitting: Hematology and Oncology

## 2024-09-18 NOTE — Telephone Encounter (Signed)
 Returned her call. She is canceling appts for today due to the weather. Appts canceled and sent scheduling message.

## 2024-09-25 ENCOUNTER — Inpatient Hospital Stay: Admitting: Hematology and Oncology

## 2024-09-25 ENCOUNTER — Inpatient Hospital Stay: Attending: Hematology and Oncology

## 2024-09-25 ENCOUNTER — Inpatient Hospital Stay
